# Patient Record
Sex: Male | Born: 1965 | Race: White | Hispanic: No | Marital: Single | Smoking: Never smoker
Health system: Southern US, Community
[De-identification: ages and names within clinical notes are randomized; demographics above are authoritative.]

## PROBLEM LIST (undated history)

## (undated) DIAGNOSIS — F419 Anxiety disorder, unspecified: Secondary | ICD-10-CM

## (undated) DIAGNOSIS — F1010 Alcohol abuse, uncomplicated: Secondary | ICD-10-CM

## (undated) DIAGNOSIS — F32A Depression, unspecified: Secondary | ICD-10-CM

## (undated) DIAGNOSIS — F329 Major depressive disorder, single episode, unspecified: Secondary | ICD-10-CM

## (undated) HISTORY — PX: HERNIA REPAIR: SHX51

---
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
## 2017-11-15 DIAGNOSIS — H53459 Other localized visual field defect, unspecified eye: Secondary | ICD-10-CM | POA: Diagnosis not present

## 2017-11-15 DIAGNOSIS — H538 Other visual disturbances: Secondary | ICD-10-CM | POA: Diagnosis not present

## 2017-11-15 DIAGNOSIS — G43B0 Ophthalmoplegic migraine, not intractable: Secondary | ICD-10-CM | POA: Diagnosis not present

## 2018-04-04 DIAGNOSIS — Z1211 Encounter for screening for malignant neoplasm of colon: Secondary | ICD-10-CM | POA: Diagnosis not present

## 2018-04-04 DIAGNOSIS — Z23 Encounter for immunization: Secondary | ICD-10-CM | POA: Diagnosis not present

## 2018-05-26 DIAGNOSIS — M9904 Segmental and somatic dysfunction of sacral region: Secondary | ICD-10-CM | POA: Diagnosis not present

## 2018-05-26 DIAGNOSIS — M9905 Segmental and somatic dysfunction of pelvic region: Secondary | ICD-10-CM | POA: Diagnosis not present

## 2018-05-26 DIAGNOSIS — M62830 Muscle spasm of back: Secondary | ICD-10-CM | POA: Diagnosis not present

## 2018-05-29 DIAGNOSIS — M9905 Segmental and somatic dysfunction of pelvic region: Secondary | ICD-10-CM | POA: Diagnosis not present

## 2018-05-29 DIAGNOSIS — M9904 Segmental and somatic dysfunction of sacral region: Secondary | ICD-10-CM | POA: Diagnosis not present

## 2018-05-29 DIAGNOSIS — M62830 Muscle spasm of back: Secondary | ICD-10-CM | POA: Diagnosis not present

## 2018-05-30 DIAGNOSIS — M62830 Muscle spasm of back: Secondary | ICD-10-CM | POA: Diagnosis not present

## 2018-05-30 DIAGNOSIS — M9905 Segmental and somatic dysfunction of pelvic region: Secondary | ICD-10-CM | POA: Diagnosis not present

## 2018-05-30 DIAGNOSIS — M9904 Segmental and somatic dysfunction of sacral region: Secondary | ICD-10-CM | POA: Diagnosis not present

## 2018-06-02 DIAGNOSIS — M9905 Segmental and somatic dysfunction of pelvic region: Secondary | ICD-10-CM | POA: Diagnosis not present

## 2018-06-02 DIAGNOSIS — M9904 Segmental and somatic dysfunction of sacral region: Secondary | ICD-10-CM | POA: Diagnosis not present

## 2018-06-02 DIAGNOSIS — M62830 Muscle spasm of back: Secondary | ICD-10-CM | POA: Diagnosis not present

## 2018-06-06 DIAGNOSIS — M9904 Segmental and somatic dysfunction of sacral region: Secondary | ICD-10-CM | POA: Diagnosis not present

## 2018-06-06 DIAGNOSIS — M62830 Muscle spasm of back: Secondary | ICD-10-CM | POA: Diagnosis not present

## 2018-06-06 DIAGNOSIS — M9905 Segmental and somatic dysfunction of pelvic region: Secondary | ICD-10-CM | POA: Diagnosis not present

## 2018-06-09 DIAGNOSIS — M9905 Segmental and somatic dysfunction of pelvic region: Secondary | ICD-10-CM | POA: Diagnosis not present

## 2018-06-09 DIAGNOSIS — M62830 Muscle spasm of back: Secondary | ICD-10-CM | POA: Diagnosis not present

## 2018-06-09 DIAGNOSIS — M9904 Segmental and somatic dysfunction of sacral region: Secondary | ICD-10-CM | POA: Diagnosis not present

## 2018-06-13 DIAGNOSIS — M62830 Muscle spasm of back: Secondary | ICD-10-CM | POA: Diagnosis not present

## 2018-06-13 DIAGNOSIS — M9905 Segmental and somatic dysfunction of pelvic region: Secondary | ICD-10-CM | POA: Diagnosis not present

## 2018-06-13 DIAGNOSIS — M9904 Segmental and somatic dysfunction of sacral region: Secondary | ICD-10-CM | POA: Diagnosis not present

## 2018-06-27 DIAGNOSIS — Z0000 Encounter for general adult medical examination without abnormal findings: Secondary | ICD-10-CM | POA: Diagnosis not present

## 2018-06-27 DIAGNOSIS — Z13220 Encounter for screening for lipoid disorders: Secondary | ICD-10-CM | POA: Diagnosis not present

## 2018-08-07 ENCOUNTER — Other Ambulatory Visit: Payer: Self-pay | Admitting: Family Medicine

## 2018-08-07 ENCOUNTER — Other Ambulatory Visit: Payer: Self-pay

## 2018-08-07 ENCOUNTER — Ambulatory Visit: Payer: Self-pay

## 2018-08-07 DIAGNOSIS — M79645 Pain in left finger(s): Secondary | ICD-10-CM

## 2018-11-16 ENCOUNTER — Encounter (HOSPITAL_COMMUNITY): Payer: Self-pay | Admitting: Emergency Medicine

## 2018-11-16 ENCOUNTER — Inpatient Hospital Stay (HOSPITAL_COMMUNITY)
Admission: EM | Admit: 2018-11-16 | Discharge: 2018-11-18 | DRG: 894 | Disposition: A | Discharge status: Home / Self Care | Payer: BC Managed Care – PPO | Attending: Family Medicine | Admitting: Family Medicine

## 2018-11-16 ENCOUNTER — Other Ambulatory Visit: Payer: Self-pay

## 2018-11-16 DIAGNOSIS — R51 Headache: Secondary | ICD-10-CM | POA: Diagnosis present

## 2018-11-16 DIAGNOSIS — F10231 Alcohol dependence with withdrawal delirium: Secondary | ICD-10-CM

## 2018-11-16 DIAGNOSIS — F10229 Alcohol dependence with intoxication, unspecified: Secondary | ICD-10-CM | POA: Diagnosis present

## 2018-11-16 DIAGNOSIS — Z79899 Other long term (current) drug therapy: Secondary | ICD-10-CM

## 2018-11-16 DIAGNOSIS — F10129 Alcohol abuse with intoxication, unspecified: Secondary | ICD-10-CM

## 2018-11-16 DIAGNOSIS — F10931 Alcohol use, unspecified with withdrawal delirium: Secondary | ICD-10-CM

## 2018-11-16 DIAGNOSIS — F329 Major depressive disorder, single episode, unspecified: Secondary | ICD-10-CM | POA: Diagnosis present

## 2018-11-16 DIAGNOSIS — F1010 Alcohol abuse, uncomplicated: Secondary | ICD-10-CM

## 2018-11-16 DIAGNOSIS — F40240 Claustrophobia: Secondary | ICD-10-CM | POA: Diagnosis present

## 2018-11-16 DIAGNOSIS — R740 Nonspecific elevation of levels of transaminase and lactic acid dehydrogenase [LDH]: Secondary | ICD-10-CM | POA: Diagnosis present

## 2018-11-16 DIAGNOSIS — F419 Anxiety disorder, unspecified: Secondary | ICD-10-CM | POA: Diagnosis present

## 2018-11-16 DIAGNOSIS — R748 Abnormal levels of other serum enzymes: Secondary | ICD-10-CM | POA: Diagnosis present

## 2018-11-16 DIAGNOSIS — D696 Thrombocytopenia, unspecified: Secondary | ICD-10-CM

## 2018-11-16 DIAGNOSIS — F10239 Alcohol dependence with withdrawal, unspecified: Principal | ICD-10-CM | POA: Diagnosis present

## 2018-11-16 DIAGNOSIS — K7030 Alcoholic cirrhosis of liver without ascites: Secondary | ICD-10-CM | POA: Diagnosis present

## 2018-11-16 DIAGNOSIS — Z1159 Encounter for screening for other viral diseases: Secondary | ICD-10-CM

## 2018-11-16 DIAGNOSIS — K709 Alcoholic liver disease, unspecified: Secondary | ICD-10-CM | POA: Diagnosis present

## 2018-11-16 DIAGNOSIS — R251 Tremor, unspecified: Secondary | ICD-10-CM | POA: Diagnosis present

## 2018-11-16 HISTORY — DX: Alcohol abuse, uncomplicated: F10.10

## 2018-11-16 LAB — COMPREHENSIVE METABOLIC PANEL
ALT: 65 U/L — ABNORMAL HIGH (ref 0–44)
AST: 79 U/L — ABNORMAL HIGH (ref 15–41)
Albumin: 4.7 g/dL (ref 3.5–5.0)
Alkaline Phosphatase: 63 U/L (ref 38–126)
Anion gap: 13 (ref 5–15)
BUN: 19 mg/dL (ref 6–20)
CO2: 29 mmol/L (ref 22–32)
Calcium: 9 mg/dL (ref 8.9–10.3)
Chloride: 98 mmol/L (ref 98–111)
Creatinine, Ser: 0.79 mg/dL (ref 0.61–1.24)
GFR calc Af Amer: >60 mL/min (ref >60)
GFR calc non Af Amer: >60 mL/min (ref >60)
Glucose, Bld: 105 mg/dL — ABNORMAL HIGH (ref 70–99)
Potassium: 3.9 mmol/L (ref 3.5–5.1)
Sodium: 140 mmol/L (ref 135–145)
Total Bilirubin: 0.3 mg/dL (ref 0.3–1.2)
Total Protein: 7.3 g/dL (ref 6.5–8.1)

## 2018-11-16 LAB — RAPID URINE DRUG SCREEN, HOSP PERFORMED
Amphetamines: NOT DETECTED
Barbiturates: NOT DETECTED
Benzodiazepines: NOT DETECTED
Cocaine: NOT DETECTED
Opiates: NOT DETECTED
Tetrahydrocannabinol: NOT DETECTED

## 2018-11-16 LAB — CBC
HCT: 43.3 % (ref 39.0–52.0)
Hemoglobin: 14.8 g/dL (ref 13.0–17.0)
MCH: 32.7 pg (ref 26.0–34.0)
MCHC: 34.2 g/dL (ref 30.0–36.0)
MCV: 95.6 fL (ref 80.0–100.0)
Platelets: 129 10*3/uL — ABNORMAL LOW (ref 150–400)
RBC: 4.53 MIL/uL (ref 4.22–5.81)
RDW: 14.3 % (ref 11.5–15.5)
WBC: 4.9 10*3/uL (ref 4.0–10.5)
nRBC: 0 % (ref 0.0–0.2)

## 2018-11-16 LAB — CBG MONITORING, ED: Glucose-Capillary: 80 mg/dL (ref 70–99)

## 2018-11-16 LAB — SARS CORONAVIRUS 2 BY RT PCR (HOSPITAL ORDER, PERFORMED IN ~~LOC~~ HOSPITAL LAB): SARS Coronavirus 2: NEGATIVE

## 2018-11-16 LAB — ETHANOL: Alcohol, Ethyl (B): 225 mg/dL — ABNORMAL HIGH (ref <10)

## 2018-11-16 MED ORDER — LORAZEPAM 1 MG PO TABS: 0.0000 mg | ORAL_TABLET | Freq: Four times a day (QID) | ORAL | Status: DC

## 2018-11-16 MED ORDER — IBUPROFEN 200 MG PO TABS: 600.0000 mg | ORAL_TABLET | Freq: Three times a day (TID) | ORAL | Status: DC | PRN

## 2018-11-16 MED ORDER — VITAMIN B-1 100 MG PO TABS
100.0000 mg | ORAL_TABLET | Freq: Every day | ORAL | Status: DC
  Administered 2018-11-16 – 2018-11-17 (×2): 100 mg via ORAL
  Filled 2018-11-16 (×2): qty 1

## 2018-11-16 MED ORDER — THIAMINE HCL 100 MG/ML IJ SOLN
Freq: Once | INTRAVENOUS | Status: AC
  Administered 2018-11-16: 15:00:00 via INTRAVENOUS
  Filled 2018-11-16: qty 1000

## 2018-11-16 MED ORDER — ZOLPIDEM TARTRATE 5 MG PO TABS: 5.0000 mg | ORAL_TABLET | Freq: Every evening | ORAL | Status: DC | PRN

## 2018-11-16 MED ORDER — LORAZEPAM 2 MG/ML IJ SOLN
0.0000 mg | Freq: Four times a day (QID) | INTRAMUSCULAR | Status: DC
  Administered 2018-11-16 (×2): 2 mg via INTRAVENOUS
  Administered 2018-11-16 (×2): 4 mg via INTRAVENOUS
  Administered 2018-11-17 (×2): 2 mg via INTRAVENOUS
  Filled 2018-11-16 (×2): qty 1
  Filled 2018-11-16: qty 2
  Filled 2018-11-16 (×2): qty 1
  Filled 2018-11-16: qty 2

## 2018-11-16 MED ORDER — FOLIC ACID 1 MG PO TABS
1.0000 mg | ORAL_TABLET | Freq: Every day | ORAL | Status: DC
  Administered 2018-11-17: 09:00:00 1 mg via ORAL
  Filled 2018-11-16: qty 1

## 2018-11-16 MED ORDER — GABAPENTIN 300 MG PO CAPS
300.0000 mg | ORAL_CAPSULE | Freq: Two times a day (BID) | ORAL | Status: DC
  Administered 2018-11-16: 300 mg via ORAL
  Filled 2018-11-16: qty 1

## 2018-11-16 MED ORDER — LORAZEPAM 1 MG PO TABS: 0.0000 mg | ORAL_TABLET | Freq: Two times a day (BID) | ORAL | Status: DC

## 2018-11-16 MED ORDER — ALUM & MAG HYDROXIDE-SIMETH 200-200-20 MG/5ML PO SUSP: 30.0000 mL | Freq: Four times a day (QID) | ORAL | Status: DC | PRN

## 2018-11-16 MED ORDER — LORAZEPAM 2 MG/ML IJ SOLN
2.0000 mg | Freq: Four times a day (QID) | INTRAMUSCULAR | Status: DC | PRN
  Administered 2018-11-16 – 2018-11-17 (×4): 2 mg via INTRAVENOUS
  Filled 2018-11-16 (×4): qty 1

## 2018-11-16 MED ORDER — ADULT MULTIVITAMIN W/MINERALS CH
1.0000 | ORAL_TABLET | Freq: Once | ORAL | Status: AC
  Administered 2018-11-16: 09:00:00 1 via ORAL
  Filled 2018-11-16: qty 1

## 2018-11-16 MED ORDER — THIAMINE HCL 100 MG/ML IJ SOLN: 100.0000 mg | Freq: Every day | INTRAMUSCULAR | Status: DC

## 2018-11-16 MED ORDER — LORAZEPAM 2 MG/ML IJ SOLN: 0.0000 mg | Freq: Two times a day (BID) | INTRAMUSCULAR | Status: DC

## 2018-11-16 MED ORDER — ONDANSETRON HCL 4 MG PO TABS: 4.0000 mg | ORAL_TABLET | Freq: Three times a day (TID) | ORAL | Status: DC | PRN

## 2018-11-16 MED ORDER — ESCITALOPRAM OXALATE 20 MG PO TABS
20.0000 mg | ORAL_TABLET | Freq: Every day | ORAL | Status: DC
  Administered 2018-11-16 – 2018-11-18 (×3): 20 mg via ORAL
  Filled 2018-11-16: qty 2
  Filled 2018-11-16: qty 1
  Filled 2018-11-16: qty 2

## 2018-11-16 MED ORDER — ENOXAPARIN SODIUM 40 MG/0.4ML ~~LOC~~ SOLN
40.0000 mg | SUBCUTANEOUS | Status: DC
  Administered 2018-11-16: 40 mg via SUBCUTANEOUS
  Filled 2018-11-16: qty 0.4

## 2018-11-16 NOTE — ED Notes (Signed)
Patient stated he has relapsed after being clean for almost 3 years. Patient stated he needs help and that he feels embarrassed about this entire situation but knows that it will get bad having gone through ETOH detox in past. Patient denies SI / HI, Voluntary, and showing no signs of aggression or agitation at this time with staff.

## 2018-11-16 NOTE — H&P (Addendum)
History and Physical    Jermaine [name] Finger ZOX:[id number] DOB: 11/25/1965 DOA: 11/16/2018  PCP: Patient, No Pcp Per   Patient coming from: Home    Chief Complaint: Alcohol intoxication, fear of DTs  HPI: Jermaine [name] Prom is a 53 y.o. male with medical history significant of chronic alcohol abuse who presents the emergency department after he drank a lot of alcohol and was a scared for getting DTs. Patient has history of chronic alcohol abuse.  He remains sober for 2 years, went to alcohol rehabilitation but relapsed few weeks ago. He has anxiety and takes Lexapro at home.  He did not specify any clear reason why did he relapse.  He was also pretty sedated during my evaluation because he was given Ativan.Ethyl alcohol level was also significantly elevated on presentation.Patient denies any other medical problems. Patient seen and examined at the bedside in the emergency department.  Currently hemodynamically stable.  Drowsy/sleepy.  He says he drinks a large bottle of vodka every day. Patient denies any fever, chills, chest pain or shortness of breath, cough, abdominal pain, nausea, vomiting or diarrhea.  ED Course: CIWA was 20.  Started on CIWA protocol.  Started on banana bag, thiamine, folic acid.UDS negative. COVID-19 screening negative.  Review of Systems: As per HPI otherwise 10 point review of systems negative.    Past Medical History:  Diagnosis Date  . Alcohol abuse     Past Surgical History:  Procedure Laterality Date  . HERNIA REPAIR       reports that he has never smoked. He has never used smokeless tobacco. He reports current alcohol use. He reports previous drug use.  No Known Allergies  No family history on file.   Prior to Admission medications   Medication Sig Start Date End Date Taking? Authorizing Provider  escitalopram (LEXAPRO) 20 MG tablet Take 20 mg by mouth daily. 08/13/18  Yes [provider]    Physical Exam: Vitals:   11/16/18 1240 11/16/18  1255 11/16/18 1300 11/16/18 1320  BP: (!) 138/96 (!) 138/96 125/89 117/85  Pulse: 98 91 90 82  Resp: 20  14 20   Temp:      TempSrc:      SpO2: 93%  97% 92%    Constitutional: Sleepy/drowsy/intoxicated with alcohol,anxious Vitals:   11/16/18 1240 11/16/18 1255 11/16/18 1300 11/16/18 1320  BP: (!) 138/96 (!) 138/96 125/89 117/85  Pulse: 98 91 90 82  Resp: 20  14 20   Temp:      TempSrc:      SpO2: 93%  97% 92%   Eyes: PERRL, lids and conjunctivae normal ENMT: Mucous membranes are moist. Neck: normal, supple, no masses, no thyromegaly Respiratory: clear to auscultation bilaterally, no wheezing, no crackles. Normal respiratory effort. No accessory muscle use.  Cardiovascular: Regular rate and rhythm, no murmurs / rubs / gallops. No extremity edema. 2+ pedal pulses. No carotid bruits.  Abdomen: no tenderness, no masses palpated. No hepatosplenomegaly. Bowel sounds positive.  Musculoskeletal: no clubbing / cyanosis. No joint deformity upper and lower extremities. Good ROM, no contractures. Normal muscle tone.  Skin: no rashes, lesions, ulcers. No induration Neurologic: CN 2-12 grossly intact. Sensation intact,Strength 5/5 in all 4.  Foley Catheter:None   Labs on Admission: I have personally reviewed following labs and imaging studies  CBC: Recent Labs  Lab 11/16/18 0839  WBC 4.9  HGB 14.8  HCT 43.3  MCV 95.6  PLT 129*   Basic Metabolic Panel: Recent Labs  Lab 11/16/18 0839  NA 140  K 3.9  CL 98  CO2 29  GLUCOSE 105*  BUN 19  CREATININE 0.79  CALCIUM 9.0   GFR: CrCl cannot be calculated (Unknown ideal weight.). Liver Function Tests: Recent Labs  Lab 11/16/18 0839  AST 79*  ALT 65*  ALKPHOS 63  BILITOT 0.3  PROT 7.3  ALBUMIN 4.7   No results for input(s): LIPASE, AMYLASE in the last 168 hours. No results for input(s): AMMONIA in the last 168 hours. Coagulation Profile: No results for input(s): INR, PROTIME in the last 168 hours. Cardiac Enzymes: No  results for input(s): CKTOTAL, CKMB, CKMBINDEX, TROPONINI in the last 168 hours. BNP (last 3 results) No results for input(s): PROBNP in the last 8760 hours. HbA1C: No results for input(s): HGBA1C in the last 72 hours. CBG: No results for input(s): GLUCAP in the last 168 hours. Lipid Profile: No results for input(s): CHOL, HDL, LDLCALC, TRIG, CHOLHDL, LDLDIRECT in the last 72 hours. Thyroid Function Tests: No results for input(s): TSH, T4TOTAL, FREET4, T3FREE, THYROIDAB in the last 72 hours. Anemia Panel: No results for input(s): VITAMINB12, FOLATE, FERRITIN, TIBC, IRON, RETICCTPCT in the last 72 hours. Urine analysis: No results found for: COLORURINE, APPEARANCEUR, LABSPEC, PHURINE, GLUCOSEU, HGBUR, BILIRUBINUR, KETONESUR, PROTEINUR, UROBILINOGEN, NITRITE, LEUKOCYTESUR  Radiological Exams on Admission: No results found.   Assessment/Plan Principal Problem:   Alcohol abuse with intoxication ([place]) Active Problems:   Chronic alcohol abuse   Elevated liver enzymes   Thrombocytopenia ([facility])   Acute alcohol intoxication with alcoholism ([place])    Alcohol abuse with intoxication: Drinks 1 large bottle of vodka every day.  Presented to the emergency department because he became scared of getting DTs. Started on CIWA protocol.  Close monitoring in telemetry.  If needs too much Ativan or has persistent DTs ,then will consider Precedex drip. Counseled for cessation of alcohol.  Start on thiamine folic acid We will request for social worker evaluation  for possible alcohol rehabilitation.  Elevated liver enzymes: Secondary to chronic alcohol abuse.  Mild  Thrombocytopenia: Likely Chronic .  Secondary to chronic alcohol abuse.  Continue to monitor.  History of anxiety: Continue Lexapro.    Severity of Illness: The appropriate patient status for this patient is OBSERVATION.    DVT prophylaxis: Lovenox Code Status: Full Family Communication: None present at the bedside Consults  called: None     Shelly Coss MD [facility] Pager [phone]  If 7PM-7AM, please contact night-coverage [email or web address] Password [email or web address]  11/16/2018, 1:30 PM

## 2018-11-16 NOTE — ED Provider Notes (Addendum)
[facility]-EMERGENCY DEPT Provider Note   CSN: [id number] Arrival date & time: 11/16/18  0754    History   Chief Complaint Chief Complaint  Patient presents with  . Delirium Tremens (DTS)    HPI Jermaine [name] is a 53 y.o. male.     53 year old male with history of alcohol abuse presents feeling anxious, requesting detox.  Patient states that he went through a formal detox program 3 years ago, relapsed about a month ago and has been drinking heavily for the past 3 to 4 days.  Patient reports drinking half a gallon of vodka daily, last drink 6 to 8 hours ago.  Denies other alcohol or drug use.  Patient states he is feeling very anxious, afraid he will have a panic attack or a seizure.  No history of prior seizures.  Patient denies suicidal or homicidal ideation, no other complaints or concerns.     Past Medical History:  Diagnosis Date  . Alcohol abuse     Patient Active Problem List   Diagnosis Date Noted  . Alcohol abuse with intoxication (HCC) 11/16/2018  . Chronic alcohol abuse 11/16/2018  . Elevated liver enzymes 11/16/2018  . Thrombocytopenia (HCC) 11/16/2018  . Acute alcohol intoxication with alcoholism ([facility]) 11/16/2018    Past Surgical History:  Procedure Laterality Date  . HERNIA REPAIR          Home Medications    Prior to Admission medications   Medication Sig Start Date End Date Taking? Authorizing Provider  escitalopram (LEXAPRO) 20 MG tablet Take 20 mg by mouth daily. 08/13/18  Yes [provider]    Family History No family history on file.  Social History Social History   Tobacco Use  . Smoking status: Never Smoker  . Smokeless tobacco: Never Used  Substance Use Topics  . Alcohol use: Yes  . Drug use: Not Currently     Allergies   Patient has no known allergies.   Review of Systems Review of Systems  Constitutional: Positive for diaphoresis. Negative for chills and fever.  Respiratory: Negative for  shortness of breath.   Cardiovascular: Negative for chest pain.  Gastrointestinal: Positive for nausea. Negative for abdominal pain and vomiting.  Genitourinary: Negative for difficulty urinating.  Musculoskeletal: Negative for arthralgias and myalgias.  Skin: Negative for rash and wound.  Allergic/Immunologic: Negative for immunocompromised state.  Neurological: Positive for tremors and headaches. Negative for weakness.  Psychiatric/Behavioral: Negative for hallucinations, self-injury and suicidal ideas. The patient is nervous/anxious.   All other systems reviewed and are negative.    Physical Exam Updated Vital Signs BP 117/85   Pulse 82   Temp 98.6 F (37 C) (Oral)   Resp 20   SpO2 92%   Physical Exam Vitals signs and nursing note reviewed.  Constitutional:      General: He is not in acute distress.    Appearance: He is well-developed. He is not diaphoretic.  HENT:     Head: Normocephalic and atraumatic.     Mouth/Throat:     Mouth: Mucous membranes are moist.  Neck:     Musculoskeletal: Neck supple.  Cardiovascular:     Rate and Rhythm: Normal rate and regular rhythm.     Pulses: Normal pulses.     Heart sounds: Normal heart sounds.  Pulmonary:     Effort: Pulmonary effort is normal.     Breath sounds: Normal breath sounds.  Abdominal:     Tenderness: There is no abdominal tenderness.  Musculoskeletal:  General: No tenderness.     Right lower leg: No edema.     Left lower leg: No edema.  Skin:    General: Skin is warm.     Comments: Slightly diaphoretic  Neurological:     Mental Status: He is alert and oriented to person, place, and time.  Psychiatric:        Attention and Perception: Attention normal.        Mood and Affect: Mood is anxious.        Speech: Speech is rapid and pressured.        Behavior: Behavior is agitated.        Thought Content: Thought content is not paranoid. Thought content does not include homicidal or suicidal ideation.       ED Treatments / Results  Labs (all labs ordered are listed, but only abnormal results are displayed) Labs Reviewed  COMPREHENSIVE METABOLIC PANEL - Abnormal; Notable for the following components:      Result Value   Glucose, Bld 105 (*)    AST 79 (*)    ALT 65 (*)    All other components within normal limits  ETHANOL - Abnormal; Notable for the following components:   Alcohol, Ethyl (B) 225 (*)    All other components within normal limits  CBC - Abnormal; Notable for the following components:   Platelets 129 (*)    All other components within normal limits  SARS CORONAVIRUS 2 (HOSPITAL ORDER, PERFORMED IN [facility])  RAPID URINE DRUG SCREEN, HOSP PERFORMED  VITAMIN B1  HIV ANTIBODY (ROUTINE TESTING W REFLEX)    EKG EKG Interpretation  Date/Time:  Sunday November 16 2018 08:22:08 EDT Ventricular Rate:  96 PR Interval:    QRS Duration: 115 QT Interval:  393 QTC Calculation: 497 R Axis:   96 Text Interpretation:  Sinus rhythm Nonspecific intraventricular conduction delay Baseline wander in lead(s) II III Abnormal ECG Confirmed by Gerhard MunchLockwood, Robert [phone]([id number]) on 11/16/2018 8:27:34 AM   Radiology No results found.  Procedures .Critical Care Performed by: Jeannie FendMurphy,  A, PA-C Authorized by: Jeannie FendMurphy,  A, PA-C   Critical care provider statement:    Critical care time (minutes):  45   Critical care was time spent personally by me on the following activities:  Discussions with consultants, evaluation of patient's response to treatment, examination of patient, ordering and performing treatments and interventions, ordering and review of laboratory studies, ordering and review of radiographic studies, pulse oximetry, re-evaluation of patient's condition, obtaining history from patient or surrogate and review of old charts   (including critical care time)  Medications Ordered in ED Medications  LORazepam (ATIVAN) injection 0-4 mg (4 mg Intravenous Given 11/16/18  1308)    Or  LORazepam (ATIVAN) tablet 0-4 mg ( Oral See Alternative 11/16/18 1308)  LORazepam (ATIVAN) injection 0-4 mg (has no administration in time range)    Or  LORazepam (ATIVAN) tablet 0-4 mg (has no administration in time range)  thiamine (VITAMIN B-1) tablet 100 mg (100 mg Oral Given 11/16/18 0902)    Or  thiamine (B-1) injection 100 mg ( Intravenous See Alternative 11/16/18 0902)  ibuprofen (ADVIL) tablet 600 mg (has no administration in time range)  ondansetron (ZOFRAN) tablet 4 mg (has no administration in time range)  alum & mag hydroxide-simeth (MAALOX/MYLANTA) 200-200-20 MG/5ML suspension 30 mL (has no administration in time range)  folic acid (FOLVITE) tablet 1 mg (has no administration in time range)  sodium chloride 0.9 % 1,000 mL with  thiamine 161 mg, folic acid 1 mg, multivitamins adult 10 mL infusion (has no administration in time range)  escitalopram (LEXAPRO) tablet 20 mg (has no administration in time range)  enoxaparin (LOVENOX) injection 40 mg (has no administration in time range)  multivitamin with minerals tablet 1 tablet (1 tablet Oral Given 11/16/18 0902)     Initial Impression / Assessment and Plan / ED Course  I have reviewed the triage vital signs and the nursing notes.  Pertinent labs & imaging results that were available during my care of the patient were reviewed by me and considered in my medical decision making (see chart for details).  Clinical Course as of Nov 15 1329  Sun Nov 15, 6125  6162 53 year old male presents to the ER with request for detox.  Patient states that he is an alcoholic, last went through detox 3 years ago, relapsed 1 month ago with report of heavy drinking for the past 3 to 4 days.  Patient reports drinking half a gallon of vodka daily, last had alcohol 6 to 8 hours prior to arrival.  Patient reports feeling like he may have a seizure or a panic attack.  No history of prior seizures.  Patient does not have any other complaints.  On  exam patient appears very uncomfortable, tremulous, clammy.  Initial CIWA of 21, given 4 of Ativan.  After Ativan patient was asleep and resting comfortably, O2 sats dropped to 88% on room air and patient was started on 2 L nasal cannula with improvement.  Patient is now awake, requesting something for tremors and began feeling unwell, he is visibly tremulous.  Repeat CIWA score is 20, patient given additional Ativan.  Case discussed with Dr. Vanita Panda, ER attending, agrees with plan for admission to hospitalist service for further monitoring.  Review of lab work, patient is a negative COVID test, CBC with platelets of 129, otherwise normal.  Alcohol 225, CMP with mildly elevated liver enzymes.  Urine drug screen negative.   [LM]  1326 Case discussed with hospitalist who will consult for admission.    [LM]    Clinical Course User Index [LM] Tacy Learn, PA-C      Final Clinical Impressions(s) / ED Diagnoses   Final diagnoses:  Delirium tremens [facility])    ED Discharge Orders    None       Tacy Learn, PA-C 11/16/18 1327    Tacy Learn, PA-C 11/16/18 1331    Carmin Muskrat, MD 11/22/18 2221

## 2018-11-16 NOTE — ED Notes (Signed)
Bed: WA16 Expected date:  Expected time:  Means of arrival:  Comments: 

## 2018-11-16 NOTE — ED Notes (Signed)
Report given to [facility] RN for 4E, Room 1416.

## 2018-11-16 NOTE — ED Triage Notes (Signed)
Pt reports he is an alcoholic and relapsed drinking about 3 gallons of ETOH. Reports last drink was about 4-5 hours ago. Pt is shaky, has headache, loose stools and nausea.

## 2018-11-16 NOTE — Patient Outreach (Signed)
ED Peer Support Specialist Patient Intake (Complete at intake & 30-60 Day Follow-up)  Name: Jermaine [name]  MRN: [id number]  Age: 53 y.o.   Date of Admission: 11/16/2018  Intake: Initial Comments:      Primary Reason Admitted: Alcohol Abuse   Lab values: Alcohol/ETOH: Positive Positive UDS? No Amphetamines: No Barbiturates: No Benzodiazepines: No Cocaine: No Opiates: No Cannabinoids: No  Demographic information: Gender: Male Ethnicity: White Marital Status: Single Insurance Status: Diplomatic Services operational officer (Work Neurosurgeon, Physicist, medical, etc.: No Lives with: Alone Living situation: Homeless  Reported Patient History: Patient reported health conditions: Anxiety disorders, Depression Patient aware of HIV and hepatitis status: No  In past year, has patient visited ED for any reason? No  Number of ED visits:    Reason(s) for visit:    In past year, has patient been hospitalized for any reason? No  Number of hospitalizations:    Reason(s) for hospitalization:    In past year, has patient been arrested? No  Number of arrests:    Reason(s) for arrest:    In past year, has patient been incarcerated? No  Number of incarcerations:    Reason(s) for incarceration:    In past year, has patient received medication-assisted treatment? No  In past year, patient received the following treatments:    In past year, has patient received any harm reduction services? No  Did this include any of the following?    In past year, has patient received care from a mental health provider for diagnosis other than SUD? No  In past year, is this first time patient has overdosed? No  Number of past overdoses:    In past year, is this first time patient has been hospitalized for an overdose? No  Number of hospitalizations for overdose(s):    Is patient currently receiving treatment for a mental health diagnosis? No  Patient reports experiencing  difficulty participating in SUD treatment: No    Most important reason(s) for this difficulty?    Has patient received prior services for treatment? No  In past, patient has received services from following agencies:    Plan of Care:  Suggested follow up at these agencies/treatment centers: (Pt stated that he does not need any assistance at the time)  Other information: [facility] spoke with Pt via Phone and was able to gain information and complete series of questions. [facility] was made aware that Pt had relapse and he does not want to attend a outpatient or inpatient facility because of the fact that he does not want to loss his job. [facility]PSS praised Pt for wanting to gain help as well as wanting to speak with [facility] about seeking help. [facility]PSS informed Pt that CPSS will follow up with Pt in the community to monitor services and to possibly link an coordinate services as needed.      [name] Edelman , [place]  11/16/2018 11:42 AM

## 2018-11-16 NOTE — ED Notes (Signed)
ED TO INPATIENT HANDOFF REPORT  Name/Age/Gender Jermaine [name] 53 y.o. male  Code Status    Code Status Orders  (From admission, onward)         Start     Ordered   11/16/18 1330  Full code  Continuous     11/16/18 1329        Code Status History    Date Active Date Inactive Code Status Order ID Comments User Context   11/16/2018 0803 11/16/2018 1329 Full Code [id number]  [facility]   Advance Care Planning Activity      Home/SNF/Other Home  Chief Complaint detox  Level of Care/Admitting Diagnosis ED Disposition    ED Disposition Condition [place]: [facility] [100102]  Level of Care: Telemetry [5]  Admit to tele based on following criteria: Complex arrhythmia (Bradycardia/Tachycardia)  Covid Evaluation: Confirmed COVID Negative  Diagnosis: Acute alcohol intoxication with alcoholism [name]) [737106]  Admitting Physician: Shelly Coss [[id number]]  Attending Physician: Shelly Coss [[id number]]  PT Class (Do Not Modify): Observation [104]  PT Acc Code (Do Not Modify): Observation [10022]       Medical History Past Medical History:  Diagnosis Date  . Alcohol abuse     Allergies No Known Allergies  IV Location/Drains/Wounds Patient Lines/Drains/Airways Status   Active Line/Drains/Airways    Name:   Placement date:   Placement time:   Site:   Days:   Peripheral IV 11/16/18 Right Forearm   11/16/18    0839    Forearm   less than 1          Labs/Imaging Results for orders placed or performed during the hospital encounter of 11/16/18 (from the past 48 hour(s))  Rapid urine drug screen (hospital performed)     Status: None   Collection Time: 11/16/18  8:02 AM  Result Value Ref Range   Opiates NONE DETECTED NONE DETECTED   Cocaine NONE DETECTED NONE DETECTED   Benzodiazepines NONE DETECTED NONE DETECTED   Amphetamines NONE DETECTED NONE DETECTED   Tetrahydrocannabinol NONE DETECTED NONE DETECTED    Barbiturates NONE DETECTED NONE DETECTED    Comment: (NOTE) DRUG SCREEN FOR MEDICAL PURPOSES ONLY.  IF CONFIRMATION IS NEEDED FOR ANY PURPOSE, NOTIFY LAB WITHIN 5 DAYS. LOWEST DETECTABLE LIMITS FOR URINE DRUG SCREEN Drug Class                     Cutoff (ng/mL) Amphetamine and metabolites    1000 Barbiturate and metabolites    200 Benzodiazepine                 093 Tricyclics and metabolites     300 Opiates and metabolites        300 Cocaine and metabolites        300 THC                            50 Performed at [facility], [place] [address] [place], [place] [address]   Comprehensive metabolic panel     Status: Abnormal   Collection Time: 11/16/18  8:39 AM  Result Value Ref Range   Sodium 140 135 - 145 mmol/L   Potassium 3.9 3.5 - 5.1 mmol/L   Chloride 98 98 - 111 mmol/L   CO2 29 22 - 32 mmol/L   Glucose, Bld 105 (H) 70 - 99 mg/dL   BUN 19 6 -  20 mg/dL   Creatinine, Ser 1.610.79 0.61 - 1.24 mg/dL   Calcium 9.0 8.9 - 09.610.3 mg/dL   Total Protein 7.3 6.5 - 8.1 g/dL   Albumin 4.7 3.5 - 5.0 g/dL   AST 79 (H) 15 - 41 U/L   ALT 65 (H) 0 - 44 U/L   Alkaline Phosphatase 63 38 - 126 U/L   Total Bilirubin 0.3 0.3 - 1.2 mg/dL   GFR calc non Af Amer >60 >60 mL/min   GFR calc Af Amer >60 >60 mL/min   Anion gap 13 5 - 15    Comment: Performed at [facility], [address] [place], [place] [address]  Ethanol     Status: Abnormal   Collection Time: 11/16/18  8:39 AM  Result Value Ref Range   Alcohol, Ethyl (B) 225 (H) <10 mg/dL    Comment: (NOTE) Lowest detectable limit for serum alcohol is 10 mg/dL. For medical purposes only. Performed at [facility], [address] [place], [place] [address]   cbc     Status: Abnormal   Collection Time: 11/16/18  8:39 AM  Result Value Ref Range   WBC 4.9 4.0 - 10.5 K/uL   RBC 4.53 4.22 - 5.81 MIL/uL   Hemoglobin 14.8 13.0 - 17.0 g/dL   HCT 14.743.3 82.939.0 - 56.252.0 %   MCV 95.6 80.0 - 100.0  fL   MCH 32.7 26.0 - 34.0 pg   MCHC 34.2 30.0 - 36.0 g/dL   RDW 13.014.3 86.511.5 - 78.415.5 %   Platelets 129 (L) 150 - 400 K/uL   nRBC 0.0 0.0 - 0.2 %    Comment: Performed at [facility], [address] [place], [place] [address]  SARS Coronavirus 2 (CEPHEID- Performed in [facility] hospital lab), Hosp Order     Status: None   Collection Time: 11/16/18  8:39 AM   Specimen: Nasopharyngeal Swab  Result Value Ref Range   SARS Coronavirus 2 NEGATIVE NEGATIVE    Comment: (NOTE) If result is NEGATIVE SARS-CoV-2 target nucleic acids are NOT DETECTED. The SARS-CoV-2 RNA is generally detectable in upper and lower  respiratory specimens during the acute phase of infection. The lowest  concentration of SARS-CoV-2 viral copies this assay can detect is 250  copies / mL. A negative result does not preclude SARS-CoV-2 infection  and should not be used as the sole basis for treatment or other  patient management decisions.  A negative result may occur with  improper specimen collection / handling, submission of specimen other  than nasopharyngeal swab, presence of viral mutation(s) within the  areas targeted by this assay, and inadequate number of viral copies  (<250 copies / mL). A negative result must be combined with clinical  observations, patient history, and epidemiological information. If result is POSITIVE SARS-CoV-2 target nucleic acids are DETECTED. The SARS-CoV-2 RNA is generally detectable in upper and lower  respiratory specimens dur ing the acute phase of infection.  Positive  results are indicative of active infection with SARS-CoV-2.  Clinical  correlation with patient history and other diagnostic information is  necessary to determine patient infection status.  Positive results do  not rule out bacterial infection or co-infection with other viruses. If result is PRESUMPTIVE POSTIVE SARS-CoV-2 nucleic acids MAY BE PRESENT.   A presumptive positive result was obtained  on the submitted specimen  and confirmed on repeat testing.  While 2019 novel coronavirus  (SARS-CoV-2) nucleic acids may be present in the submitted  sample  additional confirmatory testing may be necessary for epidemiological  and / or clinical management purposes  to differentiate between  SARS-CoV-2 and other Sarbecovirus currently known to infect humans.  If clinically indicated additional testing with an alternate test  methodology [phone]([id number]) is advised. The SARS-CoV-2 RNA is generally  detectable in upper and lower respiratory sp ecimens during the acute  phase of infection. The expected result is Negative. Fact Sheet for Patients:  BoilerBrush.com.cyhttps://www.fda.gov/media/136312/download Fact Sheet for Healthcare Providers: https://pope.com/https://www.fda.gov/media/136313/download This test is not yet approved or cleared by the [place] FDA and has been authorized for detection and/or diagnosis of SARS-CoV-2 by FDA under an Emergency Use Authorization (EUA).  This EUA will remain in effect (meaning this test can be used) for the duration of the COVID-19 declaration under Section 564(b)(1) of the Act, 21 U.S.C. section 360bbb-3(b)(1), unless the authorization is terminated or revoked sooner. Performed at [facility], [address] [place], [place] [address]   POC CBG, ED     Status: None   Collection Time: 11/16/18  2:25 PM  Result Value Ref Range   Glucose-Capillary 80 70 - 99 mg/dL   No results found.  Pending Labs Unresulted Labs (From admission, onward)    Start     Ordered   11/16/18 1329  HIV antibody (Routine Testing)  Once,   STAT     11/16/18 1329   11/16/18 0900  Vitamin B1  Once,   STAT     11/16/18 0859          Vitals/Pain Today's Vitals   11/16/18 1420 11/16/18 1436 11/16/18 1440 11/16/18 1500  BP: 119/87  121/83 128/90  Pulse: 90  95 95  Resp: 20  17 11   Temp:      TempSrc:      SpO2: 92%  93% 97%  PainSc:  0-No pain      Isolation  Precautions No active isolations  Medications Medications  LORazepam (ATIVAN) injection 0-4 mg (4 mg Intravenous Given 11/16/18 1308)    Or  LORazepam (ATIVAN) tablet 0-4 mg ( Oral See Alternative 11/16/18 1308)  LORazepam (ATIVAN) injection 0-4 mg (has no administration in time range)    Or  LORazepam (ATIVAN) tablet 0-4 mg (has no administration in time range)  thiamine (VITAMIN B-1) tablet 100 mg (100 mg Oral Given 11/16/18 0902)    Or  thiamine (B-1) injection 100 mg ( Intravenous See Alternative 11/16/18 0902)  ibuprofen (ADVIL) tablet 600 mg (has no administration in time range)  ondansetron (ZOFRAN) tablet 4 mg (has no administration in time range)  alum & mag hydroxide-simeth (MAALOX/MYLANTA) 200-200-20 MG/5ML suspension 30 mL (has no administration in time range)  folic acid (FOLVITE) tablet 1 mg (has no administration in time range)  escitalopram (LEXAPRO) tablet 20 mg (has no administration in time range)  enoxaparin (LOVENOX) injection 40 mg (has no administration in time range)  LORazepam (ATIVAN) injection 2 mg (has no administration in time range)  multivitamin with minerals tablet 1 tablet (1 tablet Oral Given 11/16/18 0902)  sodium chloride 0.9 % 1,000 mL with thiamine 100 mg, folic acid 1 mg, multivitamins adult 10 mL infusion ( Intravenous New Bag/Given 11/16/18 1459)    Mobility walks

## 2018-11-16 NOTE — ED Notes (Signed)
Patient stated patient is not hungry, however RN suggested patient attempt to consume snack. Patient is eating cheese stick and drinking sierra mist as snack.

## 2018-11-16 NOTE — ED Notes (Signed)
1st URINE REQUEST MADE 

## 2018-11-17 DIAGNOSIS — Z1159 Encounter for screening for other viral diseases: Secondary | ICD-10-CM | POA: Diagnosis not present

## 2018-11-17 DIAGNOSIS — F10239 Alcohol dependence with withdrawal, unspecified: Secondary | ICD-10-CM | POA: Diagnosis present

## 2018-11-17 DIAGNOSIS — R740 Nonspecific elevation of levels of transaminase and lactic acid dehydrogenase [LDH]: Secondary | ICD-10-CM | POA: Diagnosis present

## 2018-11-17 DIAGNOSIS — Z79899 Other long term (current) drug therapy: Secondary | ICD-10-CM | POA: Diagnosis not present

## 2018-11-17 DIAGNOSIS — R251 Tremor, unspecified: Secondary | ICD-10-CM | POA: Diagnosis present

## 2018-11-17 DIAGNOSIS — K709 Alcoholic liver disease, unspecified: Secondary | ICD-10-CM | POA: Diagnosis present

## 2018-11-17 DIAGNOSIS — R51 Headache: Secondary | ICD-10-CM | POA: Diagnosis present

## 2018-11-17 DIAGNOSIS — R748 Abnormal levels of other serum enzymes: Secondary | ICD-10-CM | POA: Diagnosis present

## 2018-11-17 DIAGNOSIS — F10229 Alcohol dependence with intoxication, unspecified: Secondary | ICD-10-CM | POA: Diagnosis present

## 2018-11-17 DIAGNOSIS — F329 Major depressive disorder, single episode, unspecified: Secondary | ICD-10-CM | POA: Diagnosis present

## 2018-11-17 DIAGNOSIS — K7030 Alcoholic cirrhosis of liver without ascites: Secondary | ICD-10-CM | POA: Diagnosis present

## 2018-11-17 DIAGNOSIS — F419 Anxiety disorder, unspecified: Secondary | ICD-10-CM | POA: Diagnosis present

## 2018-11-17 DIAGNOSIS — D696 Thrombocytopenia, unspecified: Secondary | ICD-10-CM | POA: Diagnosis present

## 2018-11-17 DIAGNOSIS — F40240 Claustrophobia: Secondary | ICD-10-CM | POA: Diagnosis present

## 2018-11-17 LAB — COMPREHENSIVE METABOLIC PANEL
ALT: 56 U/L — ABNORMAL HIGH (ref 0–44)
AST: 58 U/L — ABNORMAL HIGH (ref 15–41)
Albumin: 4.2 g/dL (ref 3.5–5.0)
Alkaline Phosphatase: 44 U/L (ref 38–126)
Anion gap: 10 (ref 5–15)
BUN: 14 mg/dL (ref 6–20)
CO2: 28 mmol/L (ref 22–32)
Calcium: 9.2 mg/dL (ref 8.9–10.3)
Chloride: 98 mmol/L (ref 98–111)
Creatinine, Ser: 0.73 mg/dL (ref 0.61–1.24)
GFR calc Af Amer: >60 mL/min (ref >60)
GFR calc non Af Amer: >60 mL/min (ref >60)
Glucose, Bld: 184 mg/dL — ABNORMAL HIGH (ref 70–99)
Potassium: 3.7 mmol/L (ref 3.5–5.1)
Sodium: 136 mmol/L (ref 135–145)
Total Bilirubin: 0.7 mg/dL (ref 0.3–1.2)
Total Protein: 6.8 g/dL (ref 6.5–8.1)

## 2018-11-17 LAB — CBC
HCT: 44.8 % (ref 39.0–52.0)
Hemoglobin: 14.3 g/dL (ref 13.0–17.0)
MCH: 31.6 pg (ref 26.0–34.0)
MCHC: 31.9 g/dL (ref 30.0–36.0)
MCV: 99.1 fL (ref 80.0–100.0)
Platelets: 120 10*3/uL — ABNORMAL LOW (ref 150–400)
RBC: 4.52 MIL/uL (ref 4.22–5.81)
RDW: 14.1 % (ref 11.5–15.5)
WBC: 4.4 10*3/uL (ref 4.0–10.5)
nRBC: 0 % (ref 0.0–0.2)

## 2018-11-17 LAB — MAGNESIUM: Magnesium: 2.2 mg/dL (ref 1.7–2.4)

## 2018-11-17 LAB — HIV ANTIBODY (ROUTINE TESTING W REFLEX): HIV Screen 4th Generation wRfx: NONREACTIVE

## 2018-11-17 MED ORDER — VITAMIN B-1 100 MG PO TABS
100.0000 mg | ORAL_TABLET | Freq: Every day | ORAL | Status: DC
  Administered 2018-11-18: 100 mg via ORAL
  Filled 2018-11-17: qty 1

## 2018-11-17 MED ORDER — LORAZEPAM 2 MG/ML IJ SOLN
3.0000 mg | Freq: Once | INTRAMUSCULAR | Status: AC
  Administered 2018-11-17: 3 mg via INTRAVENOUS

## 2018-11-17 MED ORDER — LORAZEPAM 2 MG/ML IJ SOLN: 0.0000 mg | Freq: Two times a day (BID) | INTRAMUSCULAR | Status: DC

## 2018-11-17 MED ORDER — LORAZEPAM 2 MG/ML IJ SOLN
0.0000 mg | Freq: Four times a day (QID) | INTRAMUSCULAR | Status: DC
  Administered 2018-11-17 (×2): 2 mg via INTRAVENOUS
  Administered 2018-11-18: 1 mg via INTRAVENOUS
  Filled 2018-11-17 (×3): qty 1

## 2018-11-17 MED ORDER — LORAZEPAM 2 MG/ML IJ SOLN
2.0000 mg | INTRAMUSCULAR | Status: DC | PRN
  Administered 2018-11-18: 2 mg via INTRAVENOUS
  Filled 2018-11-17: qty 1
  Filled 2018-11-17: qty 2

## 2018-11-17 MED ORDER — ADULT MULTIVITAMIN W/MINERALS CH
1.0000 | ORAL_TABLET | Freq: Every day | ORAL | Status: DC
  Administered 2018-11-18: 1 via ORAL
  Filled 2018-11-17: qty 1

## 2018-11-17 MED ORDER — THIAMINE HCL 100 MG/ML IJ SOLN
100.0000 mg | Freq: Every day | INTRAMUSCULAR | Status: DC
  Filled 2018-11-17: qty 2

## 2018-11-17 MED ORDER — FOLIC ACID 1 MG PO TABS
1.0000 mg | ORAL_TABLET | Freq: Every day | ORAL | Status: DC
  Administered 2018-11-18: 10:00:00 1 mg via ORAL
  Filled 2018-11-17: qty 1

## 2018-11-17 NOTE — Progress Notes (Signed)
Report given to ICU RN Urban Gibson, patient aware of transfer orders.  Belongings sent with patient

## 2018-11-17 NOTE — Progress Notes (Signed)
Patient wants to be discharged. I have provided education on risk/benefits of staying in the hospital. He is unhappy and agitated because his cell phone is dead and he does not have his charger, therefore, he can not call his family. No charger available, but trying to find one. He is confidential and does not want to use the phones from the hospital. Notified the charge nurse and provider on call. Awaiting call back.

## 2018-11-17 NOTE — Progress Notes (Signed)
PROGRESS NOTE    Jermaine MoanMichael Nevils  BJY:[id number]:[id number] DOB: [birth year] DOA: 11/16/2018 PCP: Patient, No Pcp Per   Brief Narrative:  Jermaine [name] is a 53 y.o. male with medical history significant of chronic alcohol abuse who presents the emergency department after he drank a lot of alcohol and was a scared for getting DTs. Patient has history of chronic alcohol abuse.  He remains sober for 2 years, went to alcohol rehabilitation but relapsed few weeks ago. He has anxiety and takes Lexapro at home.  He did not specify any clear reason why did he relapse.  He was also pretty sedated during my evaluation because he was given Ativan.Ethyl alcohol level was also significantly elevated on presentation.Patient denies any other medical problems. Patient seen and examined at the bedside in the emergency department.  Currently hemodynamically stable.  Drowsy/sleepy.  He says he drinks a large bottle of vodka every day. Patient denies any fever, chills, chest pain or shortness of breath, cough, abdominal pain, nausea, vomiting or diarrhea.  ED Course: CIWA was 20.  Started on CIWA protocol.  Started on banana bag, thiamine, folic acid.UDS negative. COVID-19 screening negative.  Assessment & Plan:   Principal Problem:   Alcohol abuse with intoxication (HCC) Active Problems:   Chronic alcohol abuse   Elevated liver enzymes   Thrombocytopenia (HCC)   Acute alcohol intoxication with alcoholism (HCC)  Alcohol abuse with intoxication: Drinks 1 large bottle of vodka every day.  Presented to the emergency department because he became scared of getting DTs. Continue ciwa May need precedex, continue to monitor.  High CIWA's recently.  Etoh cessation - social work for resources  MVI, thiamine, folic acid   Elevated liver enzymes: Secondary to chronic alcohol abuse.  Mild, improved today.  Thrombocytopenia: Likely Chronic .  Secondary to chronic alcohol abuse.  Continue to monitor.  History of anxiety:  Continue Lexapro.    DVT prophylaxis: lovenox Code Status: full code Family Communication: none, pt wants to keep this private Disposition Plan: pending improvement in withdrawal symptoms, requires inpatient monitoring given withdrawal   Consultants:   none  Procedures:  none  Antimicrobials:  Anti-infectives (From admission, onward)   None         Subjective: Last drink yesterday AM Wants to keep this private Feels like he's withdrawing Worried about work  Objective: Vitals:   11/16/18 2335 11/17/18 0523 11/17/18 0751 11/17/18 1316  BP: (!) 140/99 (!) 143/102 (!) 139/91 (!) 140/93  Pulse: 66 (!) 58 (!) 52 83  Resp: 18 16  16   Temp: 97.9 F (36.6 C) 97.8 F (36.6 C)  98.6 F (37 C)  TempSrc: Oral Oral  Oral  SpO2: 100% 99% 100% 98%  Weight:      Height:        Intake/Output Summary (Last 24 hours) at 11/17/2018 1410 Last data filed at 11/17/2018 1206 Gross per 24 hour  Intake 340 ml  Output 1425 ml  Net -1085 ml   Filed Weights   11/16/18 1708  Weight: 77.7 kg    Examination:  General exam: Appears calm and comfortable  Respiratory system: Clear to auscultation. Respiratory effort normal. Cardiovascular system: S1 & S2 heard, RRR.  Gastrointestinal system: Abdomen is nondistended, soft and nontender Central nervous system: Alert and oriented. Tremulous, appears uncomfortable and to be withdrawing.  No focal neurological deficits. Extremities: no lee. Skin: No rashes, lesions or ulcers Psychiatry: Judgement and insight appear normal. Mood & affect appropriate.     Data Reviewed: I  have personally reviewed following labs and imaging studies  CBC: Recent Labs  Lab 11/16/18 0839 11/17/18 0934  WBC 4.9 4.4  HGB 14.8 14.3  HCT 43.3 44.8  MCV 95.6 99.1  PLT 129* 854*   Basic Metabolic Panel: Recent Labs  Lab 11/16/18 0839 11/17/18 0934  NA 140 136  K 3.9 3.7  CL 98 98  CO2 29 28  GLUCOSE 105* 184*  BUN 19 14  CREATININE 0.79 0.73   CALCIUM 9.0 9.2  MG  --  2.2   GFR: Estimated Creatinine Clearance: 110.3 mL/min (by C-G formula based on SCr of 0.73 mg/dL). Liver Function Tests: Recent Labs  Lab 11/16/18 0839 11/17/18 0934  AST 79* 58*  ALT 65* 56*  ALKPHOS 63 44  BILITOT 0.3 0.7  PROT 7.3 6.8  ALBUMIN 4.7 4.2   No results for input(s): LIPASE, AMYLASE in the last 168 hours. No results for input(s): AMMONIA in the last 168 hours. Coagulation Profile: No results for input(s): INR, PROTIME in the last 168 hours. Cardiac Enzymes: No results for input(s): CKTOTAL, CKMB, CKMBINDEX, TROPONINI in the last 168 hours. BNP (last 3 results) No results for input(s): PROBNP in the last 8760 hours. HbA1C: No results for input(s): HGBA1C in the last 72 hours. CBG: Recent Labs  Lab 11/16/18 1425  GLUCAP 80   Lipid Profile: No results for input(s): CHOL, HDL, LDLCALC, TRIG, CHOLHDL, LDLDIRECT in the last 72 hours. Thyroid Function Tests: No results for input(s): TSH, T4TOTAL, FREET4, T3FREE, THYROIDAB in the last 72 hours. Anemia Panel: No results for input(s): VITAMINB12, FOLATE, FERRITIN, TIBC, IRON, RETICCTPCT in the last 72 hours. Sepsis Labs: No results for input(s): PROCALCITON, LATICACIDVEN in the last 168 hours.  Recent Results (from the past 240 hour(s))  SARS Coronavirus 2 (CEPHEID- Performed in [place] [facility] hospital lab), Hosp Order     Status: None   Collection Time: 11/16/18  8:39 AM   Specimen: Nasopharyngeal Swab  Result Value Ref Range Status   SARS Coronavirus 2 NEGATIVE NEGATIVE Final    Comment: (NOTE) If result is NEGATIVE SARS-CoV-2 target nucleic acids are NOT DETECTED. The SARS-CoV-2 RNA is generally detectable in upper and lower  respiratory specimens during the acute phase of infection. The lowest  concentration of SARS-CoV-2 viral copies this assay can detect is 250  copies / mL. A negative result does not preclude SARS-CoV-2 infection  and should not be used as the sole basis for  treatment or other  patient management decisions.  A negative result may occur with  improper specimen collection / handling, submission of specimen other  than nasopharyngeal swab, presence of viral mutation(s) within the  areas targeted by this assay, and inadequate number of viral copies  (<250 copies / mL). A negative result must be combined with clinical  observations, patient history, and epidemiological information. If result is POSITIVE SARS-CoV-2 target nucleic acids are DETECTED. The SARS-CoV-2 RNA is generally detectable in upper and lower  respiratory specimens dur ing the acute phase of infection.  Positive  results are indicative of active infection with SARS-CoV-2.  Clinical  correlation with patient history and other diagnostic information is  necessary to determine patient infection status.  Positive results do  not rule out bacterial infection or co-infection with other viruses. If result is PRESUMPTIVE POSTIVE SARS-CoV-2 nucleic acids MAY BE PRESENT.   A presumptive positive result was obtained on the submitted specimen  and confirmed on repeat testing.  While 2019 novel coronavirus  (SARS-CoV-2) nucleic acids  may be present in the submitted sample  additional confirmatory testing may be necessary for epidemiological  and / or clinical management purposes  to differentiate between  SARS-CoV-2 and other Sarbecovirus currently known to infect humans.  If clinically indicated additional testing with an alternate test  methodology [phone]([id number]) is advised. The SARS-CoV-2 RNA is generally  detectable in upper and lower respiratory sp ecimens during the acute  phase of infection. The expected result is Negative. Fact Sheet for Patients:  BoilerBrush.com.cyhttps://www.fda.gov/media/136312/download Fact Sheet for Healthcare Providers: https://pope.com/https://www.fda.gov/media/136313/download This test is not yet approved or cleared by the [place] FDA and has been authorized for detection and/or  diagnosis of SARS-CoV-2 by FDA under an Emergency Use Authorization (EUA).  This EUA will remain in effect (meaning this test can be used) for the duration of the COVID-19 declaration under Section 564(b)(1) of the Act, 21 U.S.C. section 360bbb-3(b)(1), unless the authorization is terminated or revoked sooner. Performed at [facility], [address] [place], [place] [address]          Radiology Studies: No results found.      Scheduled Meds: . enoxaparin (LOVENOX) injection  40 mg Subcutaneous Q24H  . escitalopram  20 mg Oral Daily  . folic acid  1 mg Oral Daily  . LORazepam  0-4 mg Intravenous Q6H   Or  . LORazepam  0-4 mg Oral Q6H  . [START ON 11/18/2018] LORazepam  0-4 mg Intravenous Q12H   Or  . [START ON 11/18/2018] LORazepam  0-4 mg Oral Q12H  . thiamine  100 mg Oral Daily   Or  . thiamine  100 mg Intravenous Daily   Continuous Infusions:   LOS: 0 days    Time spent: over 30 min    Lacretia Nicksaldwell Powell, MD [facility] Pager AMION  If 7PM-7AM, please contact night-coverage [email or web address] Password [email or web address] 11/17/2018, 2:10 PM

## 2018-11-17 NOTE — TOC Progression Note (Signed)
Transition of Care [facility]) - Progression Note    Patient Details  Name: Jermaine [name] MRN: [id number] Date of Birth: December 10, 1965  [facility]) CM/SW Contact  , Juliann Pulse, RN Phone Number: 11/17/2018, 1:10 PM  Clinical Narrative: Patient from home alone. Has pcp,pharmacy,transp home.States he has resources available to help with his alcohol issue.No health insurance-seen by Development worker, community.  No further CM needs.         Expected Discharge Plan and Services                                                 Social Determinants of Health (SDOH) Interventions    Readmission Risk Interventions No flowsheet data found.

## 2018-11-18 LAB — CBC
HCT: 41.7 % (ref 39.0–52.0)
Hemoglobin: 13.6 g/dL (ref 13.0–17.0)
MCH: 32.2 pg (ref 26.0–34.0)
MCHC: 32.6 g/dL (ref 30.0–36.0)
MCV: 98.6 fL (ref 80.0–100.0)
Platelets: 111 10*3/uL — ABNORMAL LOW (ref 150–400)
RBC: 4.23 MIL/uL (ref 4.22–5.81)
RDW: 14.1 % (ref 11.5–15.5)
WBC: 4.7 10*3/uL (ref 4.0–10.5)
nRBC: 0 % (ref 0.0–0.2)

## 2018-11-18 LAB — COMPREHENSIVE METABOLIC PANEL
ALT: 54 U/L — ABNORMAL HIGH (ref 0–44)
AST: 53 U/L — ABNORMAL HIGH (ref 15–41)
Albumin: 4.3 g/dL (ref 3.5–5.0)
Alkaline Phosphatase: 46 U/L (ref 38–126)
Anion gap: 10 (ref 5–15)
BUN: 15 mg/dL (ref 6–20)
CO2: 27 mmol/L (ref 22–32)
Calcium: 9.1 mg/dL (ref 8.9–10.3)
Chloride: 100 mmol/L (ref 98–111)
Creatinine, Ser: 0.75 mg/dL (ref 0.61–1.24)
GFR calc Af Amer: >60 mL/min (ref >60)
GFR calc non Af Amer: >60 mL/min (ref >60)
Glucose, Bld: 107 mg/dL — ABNORMAL HIGH (ref 70–99)
Potassium: 3.7 mmol/L (ref 3.5–5.1)
Sodium: 137 mmol/L (ref 135–145)
Total Bilirubin: 0.7 mg/dL (ref 0.3–1.2)
Total Protein: 7 g/dL (ref 6.5–8.1)

## 2018-11-18 LAB — MAGNESIUM: Magnesium: 2.4 mg/dL (ref 1.7–2.4)

## 2018-11-18 MED ORDER — CHLORDIAZEPOXIDE HCL 25 MG PO CAPS: ORAL_CAPSULE | ORAL | 0 refills | Status: DC

## 2018-11-18 MED ORDER — CHLORHEXIDINE GLUCONATE CLOTH 2 % EX PADS
6.0000 | MEDICATED_PAD | Freq: Every day | CUTANEOUS | Status: DC
  Administered 2018-11-18: 6 via TOPICAL

## 2018-11-18 MED ORDER — ORAL CARE MOUTH RINSE
15.0000 mL | Freq: Two times a day (BID) | OROMUCOSAL | Status: DC
  Administered 2018-11-18: 10:00:00 15 mL via OROMUCOSAL

## 2018-11-18 NOTE — Discharge Summary (Signed)
Pt left AMA.  Evaluated at bedside, Denzel Etienne&ox3, discussed risks of etoh withdrawal including seizure, death, etc.  Pt expresses that he still would like to leave.  His intention is still for alcohol cessation right now.  Will prescribe librium.  Encouraged outpatient follow up with outpatient substance abuse resources (he stated he knew these).

## 2018-11-18 NOTE — Progress Notes (Signed)
[place] Progress Note Patient Name: Jermaine [name] DOB: 1966-04-23 MRN: [id number]   Date of Service  11/18/2018  HPI/Events of Note  Notified by ICU RN that patient was pulling off his leads.  He was getting up from the bed.  Pt was given Ativan 2mg  and he was then allowing the RNs to apply the leads.  At the time of my video assessment, the patient was cooperative.  Pt was reoriented and expressed that he needs help getting his job back.  eICU Interventions  Continue per CIWA protocol.      Intervention Category Minor Interventions: Agitation / anxiety - evaluation and management  Elsie Lincoln 11/18/2018, 3:44 AM

## 2018-11-18 NOTE — Progress Notes (Addendum)
PROGRESS NOTE    Jermaine [name]  HAL:[id number] DOB: 05-22-1966 DOA: 11/16/2018 PCP: London Pepper, MD   Brief Narrative:  Jermaine [name] is Jermaine [name] 53 y.o. male with medical history significant of chronic alcohol abuse who presents the emergency department after he drank Jermaine [name] lot of alcohol and was Jermaine [name] scared for getting DTs. Patient has history of chronic alcohol abuse.  He remains sober for 2 years, went to alcohol rehabilitation but relapsed few weeks ago. He has anxiety and takes Lexapro at home.  He did not specify any clear reason why did he relapse.  He was also pretty sedated during my evaluation because he was given Ativan.Ethyl alcohol level was also significantly elevated on presentation.Patient denies any other medical problems. Patient seen and examined at the bedside in the emergency department.  Currently hemodynamically stable.  Drowsy/sleepy.  He says he drinks Zavior Thomason large bottle of vodka every day. Patient denies any fever, chills, chest pain or shortness of breath, cough, abdominal pain, nausea, vomiting or diarrhea.  ED Course: CIWA was 20.  Started on CIWA protocol.  Started on banana bag, thiamine, folic acid.UDS negative. COVID-19 screening negative.  Assessment & Plan:   Principal Problem:   Alcohol abuse with intoxication ([place]) Active Problems:   Chronic alcohol abuse   Elevated liver enzymes   Thrombocytopenia (HCC)   Acute alcohol intoxication with alcoholism ([place])   Alcohol abuse with intoxication: Drinks 1 large bottle of vodka every day.  Presented to the emergency department because he became scared of getting DTs. Last drink 6/21 AM Continue ciwa, last 0, but had high scores in the 20's and teens overnight.  He's mildly confused this morning. May need precedex, continue to monitor.  High CIWA's recently.  Etoh cessation - social work for resources  MVI, thiamine, folic acid   Elevated liver enzymes: Secondary to chronic alcohol abuse.  Mild, improved today.   Thrombocytopenia: Likely Chronic .  Secondary to chronic alcohol abuse.  Continue to monitor.  History of anxiety: Continue Lexapro.  DVT prophylaxis: lovenox Code Status: full code Family Communication: none, pt wants to keep this private Disposition Plan: pending improvement in withdrawal symptoms, requires inpatient monitoring given withdrawal   Consultants:   none  Procedures:  none  Antimicrobials:  Anti-infectives (From admission, onward)   None         Subjective: Jermaine [name] bit confused this monring Feels like he's been here forever Jermaine [name]&Ox3, but slow with his responses  Objective: Vitals:   11/18/18 0437 11/18/18 0500 11/18/18 0600 11/18/18 0800  BP:  133/85 110/73 116/76  Pulse: 63 71 64 63  Resp: 16 19 16 17   Temp:    97.7 F (36.5 C)  TempSrc:    Oral  SpO2: 98% 93% 96% 94%  Weight:      Height:        Intake/Output Summary (Last 24 hours) at 11/18/2018 1112 Last data filed at 11/17/2018 2200 Gross per 24 hour  Intake 480 ml  Output 900 ml  Net -420 ml   Filed Weights   11/16/18 1708  Weight: 77.7 kg    Examination:  General: No acute distress. Cardiovascular: Heart sounds show Jermaine [name] regular rate, and rhythm.  Lungs: Clear to auscultation bilaterally Abdomen: Soft, nontender, nondistended  Neurological: Alert and oriented 3. Moves all extremities 4. Cranial nerves II through XII grossly intact. Skin: Warm and dry. No rashes or lesions. Extremities: No clubbing or cyanosis. No edema. Psychiatric: Mood and affect are normal. Insight and judgment are appropriate.  Data Reviewed: I have personally reviewed following labs and imaging studies  CBC: Recent Labs  Lab 11/16/18 0839 11/17/18 0934 11/18/18 0216  WBC 4.9 4.4 4.7  HGB 14.8 14.3 13.6  HCT 43.3 44.8 41.7  MCV 95.6 99.1 98.6  PLT 129* 120* 111*   Basic Metabolic Panel: Recent Labs  Lab 11/16/18 0839 11/17/18 0934 11/18/18 0216  NA 140 136 137  K 3.9 3.7 3.7  CL 98 98 100   CO2 29 28 27   GLUCOSE 105* 184* 107*  BUN 19 14 15   CREATININE 0.79 0.73 0.75  CALCIUM 9.0 9.2 9.1  MG  --  2.2 2.4   GFR: Estimated Creatinine Clearance: 110.3 mL/min (by C-G formula based on SCr of 0.75 mg/dL). Liver Function Tests: Recent Labs  Lab 11/16/18 0839 11/17/18 0934 11/18/18 0216  AST 79* 58* 53*  ALT 65* 56* 54*  ALKPHOS 63 44 46  BILITOT 0.3 0.7 0.7  PROT 7.3 6.8 7.0  ALBUMIN 4.7 4.2 4.3   No results for input(s): LIPASE, AMYLASE in the last 168 hours. No results for input(s): AMMONIA in the last 168 hours. Coagulation Profile: No results for input(s): INR, PROTIME in the last 168 hours. Cardiac Enzymes: No results for input(s): CKTOTAL, CKMB, CKMBINDEX, TROPONINI in the last 168 hours. BNP (last 3 results) No results for input(s): PROBNP in the last 8760 hours. HbA1C: No results for input(s): HGBA1C in the last 72 hours. CBG: Recent Labs  Lab 11/16/18 1425  GLUCAP 80   Lipid Profile: No results for input(s): CHOL, HDL, LDLCALC, TRIG, CHOLHDL, LDLDIRECT in the last 72 hours. Thyroid Function Tests: No results for input(s): TSH, T4TOTAL, FREET4, T3FREE, THYROIDAB in the last 72 hours. Anemia Panel: No results for input(s): VITAMINB12, FOLATE, FERRITIN, TIBC, IRON, RETICCTPCT in the last 72 hours. Sepsis Labs: No results for input(s): PROCALCITON, LATICACIDVEN in the last 168 hours.  Recent Results (from the past 240 hour(s))  SARS Coronavirus 2 (CEPHEID- Performed in [facility] lab), Hosp Order     Status: None   Collection Time: 11/16/18  8:39 AM   Specimen: Nasopharyngeal Swab  Result Value Ref Range Status   SARS Coronavirus 2 NEGATIVE NEGATIVE Final    Comment: (NOTE) If result is NEGATIVE SARS-CoV-2 target nucleic acids are NOT DETECTED. The SARS-CoV-2 RNA is generally detectable in upper and lower  respiratory specimens during the acute phase of infection. The lowest  concentration of SARS-CoV-2 viral copies this assay can  detect is 250  copies / mL. Shay Jhaveri negative result does not preclude SARS-CoV-2 infection  and should not be used as the sole basis for treatment or other  patient management decisions.  Nehemiah Montee negative result may occur with  improper specimen collection / handling, submission of specimen other  than nasopharyngeal swab, presence of viral mutation(s) within the  areas targeted by this assay, and inadequate number of viral copies  (<250 copies / mL). Caragh Gasper negative result must be combined with clinical  observations, patient history, and epidemiological information. If result is POSITIVE SARS-CoV-2 target nucleic acids are DETECTED. The SARS-CoV-2 RNA is generally detectable in upper and lower  respiratory specimens dur ing the acute phase of infection.  Positive  results are indicative of active infection with SARS-CoV-2.  Clinical  correlation with patient history and other diagnostic information is  necessary to determine patient infection status.  Positive results do  not rule out bacterial infection or co-infection with other viruses. If result is PRESUMPTIVE POSTIVE SARS-CoV-2 nucleic acids MAY  BE PRESENT.   Latrese Carolan presumptive positive result was obtained on the submitted specimen  and confirmed on repeat testing.  While 2019 novel coronavirus  (SARS-CoV-2) nucleic acids may be present in the submitted sample  additional confirmatory testing may be necessary for epidemiological  and / or clinical management purposes  to differentiate between  SARS-CoV-2 and other Sarbecovirus currently known to infect humans.  If clinically indicated additional testing with an alternate test  methodology [id number]) is advised. The SARS-CoV-2 RNA is generally  detectable in upper and lower respiratory sp ecimens during the acute  phase of infection. The expected result is Negative. Fact Sheet for Patients:  BoilerBrush.com.cyhttps://www.fda.gov/media/136312/download Fact Sheet for Healthcare Providers:  https://pope.com/https://www.fda.gov/media/136313/download This test is not yet approved or cleared by the [place] FDA and has been authorized for detection and/or diagnosis of SARS-CoV-2 by FDA under an Emergency Use Authorization (EUA).  This EUA will remain in effect (meaning this test can be used) for the duration of the COVID-19 declaration under Section 564(b)(1) of the Act, 21 U.S.C. section 360bbb-3(b)(1), unless the authorization is terminated or revoked sooner. Performed at [facility], [address] [place], [place] [address]          Radiology Studies: No results found.      Scheduled Meds: . Chlorhexidine Gluconate Cloth  6 each Topical Daily  . enoxaparin (LOVENOX) injection  40 mg Subcutaneous Q24H  . escitalopram  20 mg Oral Daily  . folic acid  1 mg Oral Daily  . LORazepam  0-4 mg Intravenous Q6H   Followed by  . [START ON 11/19/2018] LORazepam  0-4 mg Intravenous Q12H  . mouth rinse  15 mL Mouth Rinse BID  . multivitamin with minerals  1 tablet Oral Daily  . thiamine  100 mg Oral Daily   Or  . thiamine  100 mg Intravenous Daily   Continuous Infusions:   LOS: 1 day    Time spent: over 30 min    Lacretia Nicksaldwell Powell, MD [facility] Pager AMION  If 7PM-7AM, please contact night-coverage [email or web address] Password [email or web address] 11/18/2018, 11:12 AM

## 2018-11-18 NOTE — Progress Notes (Signed)
Pt requesting to leave AMA, MD Florene Glen was paged and came to run to talk to patient. Patient is leaving AMA paper was signed, patient was walked out.

## 2018-11-19 ENCOUNTER — Inpatient Hospital Stay (HOSPITAL_COMMUNITY)
Admission: EM | Admit: 2018-11-19 | Discharge: 2018-11-23 | Disposition: A | Discharge status: Home / Self Care | Payer: BC Managed Care – PPO | Source: Home / Self Care | Attending: Internal Medicine | Admitting: Internal Medicine

## 2018-11-19 ENCOUNTER — Other Ambulatory Visit: Payer: Self-pay

## 2018-11-19 ENCOUNTER — Encounter (HOSPITAL_COMMUNITY): Payer: Self-pay

## 2018-11-19 ENCOUNTER — Emergency Department (HOSPITAL_COMMUNITY): Payer: BC Managed Care – PPO

## 2018-11-19 DIAGNOSIS — F1010 Alcohol abuse, uncomplicated: Secondary | ICD-10-CM | POA: Diagnosis present

## 2018-11-19 DIAGNOSIS — F32A Depression, unspecified: Secondary | ICD-10-CM | POA: Diagnosis present

## 2018-11-19 DIAGNOSIS — D696 Thrombocytopenia, unspecified: Secondary | ICD-10-CM | POA: Diagnosis present

## 2018-11-19 DIAGNOSIS — Z20828 Contact with and (suspected) exposure to other viral communicable diseases: Secondary | ICD-10-CM

## 2018-11-19 DIAGNOSIS — F329 Major depressive disorder, single episode, unspecified: Secondary | ICD-10-CM | POA: Diagnosis present

## 2018-11-19 DIAGNOSIS — K7030 Alcoholic cirrhosis of liver without ascites: Secondary | ICD-10-CM | POA: Diagnosis present

## 2018-11-19 DIAGNOSIS — F10939 Alcohol use, unspecified with withdrawal, unspecified: Secondary | ICD-10-CM

## 2018-11-19 DIAGNOSIS — F10239 Alcohol dependence with withdrawal, unspecified: Secondary | ICD-10-CM | POA: Diagnosis present

## 2018-11-19 DIAGNOSIS — K760 Fatty (change of) liver, not elsewhere classified: Secondary | ICD-10-CM

## 2018-11-19 DIAGNOSIS — R748 Abnormal levels of other serum enzymes: Secondary | ICD-10-CM | POA: Diagnosis present

## 2018-11-19 DIAGNOSIS — F10230 Alcohol dependence with withdrawal, uncomplicated: Secondary | ICD-10-CM

## 2018-11-19 DIAGNOSIS — G9341 Metabolic encephalopathy: Secondary | ICD-10-CM | POA: Diagnosis present

## 2018-11-19 HISTORY — DX: Anxiety disorder, unspecified: F41.9

## 2018-11-19 HISTORY — DX: Depression, unspecified: F32.A

## 2018-11-19 LAB — CBC WITH DIFFERENTIAL/PLATELET
Abs Immature Granulocytes: 0.03 K/uL (ref 0.00–0.07)
Basophils Absolute: 0 K/uL (ref 0.0–0.1)
Basophils Relative: 0 %
Eosinophils Absolute: 0 K/uL (ref 0.0–0.5)
Eosinophils Relative: 1 %
HCT: 43.6 % (ref 39.0–52.0)
Hemoglobin: 14.5 g/dL (ref 13.0–17.0)
Immature Granulocytes: 1 %
Lymphocytes Relative: 25 %
Lymphs Abs: 1.2 K/uL (ref 0.7–4.0)
MCH: 32.6 pg (ref 26.0–34.0)
MCHC: 33.3 g/dL (ref 30.0–36.0)
MCV: 98 fL (ref 80.0–100.0)
Monocytes Absolute: 0.4 K/uL (ref 0.1–1.0)
Monocytes Relative: 9 %
Neutro Abs: 3.1 K/uL (ref 1.7–7.7)
Neutrophils Relative %: 64 %
Platelets: 116 K/uL — ABNORMAL LOW (ref 150–400)
RBC: 4.45 MIL/uL (ref 4.22–5.81)
RDW: 14.2 % (ref 11.5–15.5)
WBC: 4.8 K/uL (ref 4.0–10.5)
nRBC: 0 % (ref 0.0–0.2)

## 2018-11-19 LAB — COMPREHENSIVE METABOLIC PANEL WITH GFR
ALT: 113 U/L — ABNORMAL HIGH (ref 0–44)
AST: 107 U/L — ABNORMAL HIGH (ref 15–41)
Albumin: 4.6 g/dL (ref 3.5–5.0)
Alkaline Phosphatase: 45 U/L (ref 38–126)
Anion gap: 10 (ref 5–15)
BUN: 15 mg/dL (ref 6–20)
CO2: 25 mmol/L (ref 22–32)
Calcium: 9.2 mg/dL (ref 8.9–10.3)
Chloride: 103 mmol/L (ref 98–111)
Creatinine, Ser: 0.79 mg/dL (ref 0.61–1.24)
GFR calc Af Amer: >60 mL/min (ref >60)
GFR calc non Af Amer: >60 mL/min (ref >60)
Glucose, Bld: 87 mg/dL (ref 70–99)
Potassium: 3.6 mmol/L (ref 3.5–5.1)
Sodium: 138 mmol/L (ref 135–145)
Total Bilirubin: 1 mg/dL (ref 0.3–1.2)
Total Protein: 7.4 g/dL (ref 6.5–8.1)

## 2018-11-19 LAB — URINALYSIS, ROUTINE W REFLEX MICROSCOPIC
Bilirubin Urine: NEGATIVE
Glucose, UA: NEGATIVE mg/dL
Hgb urine dipstick: NEGATIVE
Ketones, ur: NEGATIVE mg/dL
Leukocytes,Ua: NEGATIVE
Nitrite: NEGATIVE
Protein, ur: NEGATIVE mg/dL
Specific Gravity, Urine: 1.015 (ref 1.005–1.030)
pH: 5 (ref 5.0–8.0)

## 2018-11-19 LAB — CBC
HCT: 42 % (ref 39.0–52.0)
Hemoglobin: 14.1 g/dL (ref 13.0–17.0)
MCH: 33.3 pg (ref 26.0–34.0)
MCHC: 33.6 g/dL (ref 30.0–36.0)
MCV: 99.3 fL (ref 80.0–100.0)
Platelets: 113 10*3/uL — ABNORMAL LOW (ref 150–400)
RBC: 4.23 MIL/uL (ref 4.22–5.81)
RDW: 14.3 % (ref 11.5–15.5)
WBC: 5.1 10*3/uL (ref 4.0–10.5)
nRBC: 0 % (ref 0.0–0.2)

## 2018-11-19 LAB — SARS CORONAVIRUS 2 BY RT PCR (HOSPITAL ORDER, PERFORMED IN ~~LOC~~ HOSPITAL LAB): SARS Coronavirus 2: NEGATIVE

## 2018-11-19 LAB — CREATININE, SERUM
Creatinine, Ser: 0.75 mg/dL (ref 0.61–1.24)
GFR calc Af Amer: >60 mL/min (ref >60)
GFR calc non Af Amer: >60 mL/min (ref >60)

## 2018-11-19 LAB — LIPASE, BLOOD: Lipase: 32 U/L (ref 11–51)

## 2018-11-19 LAB — ETHANOL: Alcohol, Ethyl (B): <10 mg/dL (ref <10)

## 2018-11-19 LAB — MAGNESIUM: Magnesium: 2.2 mg/dL (ref 1.7–2.4)

## 2018-11-19 MED ORDER — ONDANSETRON HCL 4 MG PO TABS
4.0000 mg | ORAL_TABLET | Freq: Four times a day (QID) | ORAL | Status: DC | PRN
  Administered 2018-11-19: 4 mg via ORAL
  Filled 2018-11-19: qty 1

## 2018-11-19 MED ORDER — ACETAMINOPHEN 650 MG RE SUPP: 650.0000 mg | Freq: Four times a day (QID) | RECTAL | Status: DC | PRN

## 2018-11-19 MED ORDER — ESCITALOPRAM OXALATE 20 MG PO TABS
20.0000 mg | ORAL_TABLET | Freq: Every day | ORAL | Status: DC
  Administered 2018-11-19 – 2018-11-23 (×5): 20 mg via ORAL
  Filled 2018-11-19 (×5): qty 1

## 2018-11-19 MED ORDER — ACETAMINOPHEN 325 MG PO TABS: 650.0000 mg | ORAL_TABLET | Freq: Four times a day (QID) | ORAL | Status: DC | PRN

## 2018-11-19 MED ORDER — ENOXAPARIN SODIUM 40 MG/0.4ML ~~LOC~~ SOLN
40.0000 mg | SUBCUTANEOUS | Status: DC
  Administered 2018-11-19 – 2018-11-22 (×3): 40 mg via SUBCUTANEOUS
  Filled 2018-11-19 (×4): qty 0.4

## 2018-11-19 MED ORDER — ONDANSETRON HCL 4 MG/2ML IJ SOLN: 4.0000 mg | Freq: Four times a day (QID) | INTRAMUSCULAR | Status: DC | PRN

## 2018-11-19 MED ORDER — ADULT MULTIVITAMIN W/MINERALS CH
1.0000 | ORAL_TABLET | Freq: Every day | ORAL | Status: DC
  Administered 2018-11-19 – 2018-11-23 (×5): 1 via ORAL
  Filled 2018-11-19 (×5): qty 1

## 2018-11-19 MED ORDER — POTASSIUM CHLORIDE IN NACL 20-0.9 MEQ/L-% IV SOLN
INTRAVENOUS | Status: DC
  Administered 2018-11-19 (×2): via INTRAVENOUS
  Filled 2018-11-19 (×3): qty 1000

## 2018-11-19 MED ORDER — LORAZEPAM 1 MG PO TABS
1.0000 mg | ORAL_TABLET | ORAL | Status: DC | PRN
  Administered 2018-11-19 – 2018-11-23 (×11): 1 mg via ORAL
  Filled 2018-11-19 (×11): qty 1

## 2018-11-19 MED ORDER — LORAZEPAM 2 MG/ML IJ SOLN: 1.0000 mg | Freq: Four times a day (QID) | INTRAMUSCULAR | Status: DC | PRN

## 2018-11-19 MED ORDER — FOLIC ACID 1 MG PO TABS
1.0000 mg | ORAL_TABLET | Freq: Every day | ORAL | Status: DC
  Administered 2018-11-19 – 2018-11-23 (×5): 1 mg via ORAL
  Filled 2018-11-19 (×5): qty 1

## 2018-11-19 MED ORDER — ORAL CARE MOUTH RINSE
15.0000 mL | Freq: Two times a day (BID) | OROMUCOSAL | Status: DC
  Administered 2018-11-19 – 2018-11-21 (×4): 15 mL via OROMUCOSAL

## 2018-11-19 MED ORDER — CHLORHEXIDINE GLUCONATE CLOTH 2 % EX PADS
6.0000 | MEDICATED_PAD | Freq: Every day | CUTANEOUS | Status: DC
  Administered 2018-11-20: 09:00:00 6 via TOPICAL

## 2018-11-19 MED ORDER — CHLORDIAZEPOXIDE HCL 25 MG PO CAPS
25.0000 mg | ORAL_CAPSULE | Freq: Three times a day (TID) | ORAL | Status: DC
  Administered 2018-11-19 (×2): 25 mg via ORAL
  Filled 2018-11-19 (×2): qty 1

## 2018-11-19 MED ORDER — SODIUM CHLORIDE 0.9% FLUSH
3.0000 mL | Freq: Two times a day (BID) | INTRAVENOUS | Status: DC
  Administered 2018-11-19 – 2018-11-23 (×3): 3 mL via INTRAVENOUS

## 2018-11-19 MED ORDER — THIAMINE HCL 100 MG/ML IJ SOLN
100.0000 mg | Freq: Once | INTRAMUSCULAR | Status: AC
  Administered 2018-11-19: 100 mg via INTRAVENOUS
  Filled 2018-11-19: qty 2

## 2018-11-19 MED ORDER — VITAMIN B-1 100 MG PO TABS
100.0000 mg | ORAL_TABLET | Freq: Every day | ORAL | Status: DC
  Administered 2018-11-19 – 2018-11-23 (×5): 100 mg via ORAL
  Filled 2018-11-19 (×5): qty 1

## 2018-11-19 MED ORDER — SODIUM CHLORIDE 0.9 % IV BOLUS
1000.0000 mL | Freq: Once | INTRAVENOUS | Status: AC
  Administered 2018-11-19: 1000 mL via INTRAVENOUS

## 2018-11-19 MED ORDER — LORAZEPAM 2 MG/ML IJ SOLN
1.0000 mg | Freq: Once | INTRAMUSCULAR | Status: AC
  Administered 2018-11-19: 1 mg via INTRAVENOUS
  Filled 2018-11-19: qty 1

## 2018-11-19 MED ORDER — LORAZEPAM 2 MG/ML IJ SOLN: 1.0000 mg | INTRAMUSCULAR | Status: DC | PRN

## 2018-11-19 MED ORDER — THIAMINE HCL 100 MG/ML IJ SOLN: 100.0000 mg | Freq: Every day | INTRAMUSCULAR | Status: DC

## 2018-11-19 MED ORDER — LORAZEPAM 1 MG PO TABS: 1.0000 mg | ORAL_TABLET | Freq: Four times a day (QID) | ORAL | Status: DC | PRN

## 2018-11-19 NOTE — ED Notes (Signed)
Attempted to call report at this time 

## 2018-11-19 NOTE — ED Triage Notes (Signed)
Patient reports that he was discharged from the hospital yesterday for DT's yesterday. Patient  States he drank a fifth of liquor last night.  Patient states, "I came in today because I feel "peakish and feverish."

## 2018-11-19 NOTE — ED Notes (Signed)
ED TO INPATIENT HANDOFF REPORT  Name/Age/Gender Jermaine MoanMichael [name] 53 y.o. male  Code Status Code Status History    Date Active Date Inactive Code Status Order ID Comments User Context   11/16/2018 1329 11/18/2018 1846 Full Code [id number]  Burnadette PopAdhikari, Amrit, MD ED   11/16/2018 0803 11/16/2018 1329 Full Code [id number]  Alden HippMurphy, Laura A, PA-C ED   Advance Care Planning Activity      Home/SNF/Other Home  Chief Complaint chest pain / short of breath   Level of Care/Admitting Diagnosis ED Disposition    ED Disposition Condition Comment   Admit  [facility] [[id number]]  Level of Care: Stepdown [14]  Admit to SDU based on following criteria: Other see comments  Comments: Alcohol withdrawal.  Concern of DTs and requirement of Precedex  Covid Evaluation: N/A  Diagnosis: Alcohol withdrawal (HCC) [291.81.ICD-9-CM]  Admitting Physician: Hughie ClossHWANI, RAVI [[id number]]  Attending Physician: Hughie ClossPAHWANI, RAVI [phone][[id number]]  PT Class (Do Not Modify): Observation [104]  PT Acc Code (Do Not Modify): Observation [10022]       Medical History Past Medical History:  Diagnosis Date  . Alcohol abuse   . Anxiety   . Depression     Allergies No Known Allergies  IV Location/Drains/Wounds Patient Lines/Drains/Airways Status   Active Line/Drains/Airways    Name:   Placement date:   Placement time:   Site:   Days:   Peripheral IV 11/19/18 Left Forearm   11/19/18    0914    Forearm   less than 1          Labs/Imaging Results for orders placed or performed during the hospital encounter of 11/19/18 (from the past 48 hour(s))  Comprehensive metabolic panel     Status: Abnormal   Collection Time: 11/19/18  9:21 AM  Result Value Ref Range   Sodium 138 135 - 145 mmol/L   Potassium 3.6 3.5 - 5.1 mmol/L   Chloride 103 98 - 111 mmol/L   CO2 25 22 - 32 mmol/L   Glucose, Bld 87 70 - 99 mg/dL   BUN 15 6 - 20 mg/dL   Creatinine, Ser 2.720.79 0.61 - 1.24 mg/dL   Calcium 9.2 8.9 - 53.610.3  mg/dL   Total Protein 7.4 6.5 - 8.1 g/dL   Albumin 4.6 3.5 - 5.0 g/dL   AST 644107 (H) 15 - 41 U/L   ALT 113 (H) 0 - 44 U/L   Alkaline Phosphatase 45 38 - 126 U/L   Total Bilirubin 1.0 0.3 - 1.2 mg/dL   GFR calc non Af Amer >60 >60 mL/min   GFR calc Af Amer >60 >60 mL/min   Anion gap 10 5 - 15    Comment: Performed at [facility], [address] [place], [place] [address]  Ethanol     Status: None   Collection Time: 11/19/18  9:21 AM  Result Value Ref Range   Alcohol, Ethyl (B) <10 <10 mg/dL    Comment: (NOTE) Lowest detectable limit for serum alcohol is 10 mg/dL. For medical purposes only. Performed at [facility], [address] [place], [place] [address]   Lipase, blood     Status: None   Collection Time: 11/19/18  9:21 AM  Result Value Ref Range   Lipase 32 11 - 51 U/L    Comment: Performed at [facility], [address] [place], [place] [address]  CBC with Differential     Status: Abnormal   Collection Time: 11/19/18  9:21 AM  Result Value Ref Range   WBC 4.8 4.0 - 10.5 K/uL   RBC 4.45 4.22 - 5.81 MIL/uL   Hemoglobin 14.5 13.0 - 17.0 g/dL   HCT 43.6 39.0 - 52.0 %   MCV 98.0 80.0 - 100.0 fL   MCH 32.6 26.0 - 34.0 pg   MCHC 33.3 30.0 - 36.0 g/dL   RDW 14.2 11.5 - 15.5 %   Platelets 116 (L) 150 - 400 K/uL    Comment: REPEATED TO VERIFY PLATELET COUNT CONFIRMED BY SMEAR SPECIMEN CHECKED FOR CLOTS Immature Platelet Fraction may be clinically indicated, consider ordering this additional test [id number]    nRBC 0.0 0.0 - 0.2 %   Neutrophils Relative % 64 %   Neutro Abs 3.1 1.7 - 7.7 K/uL   Lymphocytes Relative 25 %   Lymphs Abs 1.2 0.7 - 4.0 K/uL   Monocytes Relative 9 %   Monocytes Absolute 0.4 0.1 - 1.0 K/uL   Eosinophils Relative 1 %   Eosinophils Absolute 0.0 0.0 - 0.5 K/uL   Basophils Relative 0 %   Basophils Absolute 0.0 0.0 - 0.1 K/uL   Immature Granulocytes 1 %   Abs Immature Granulocytes 0.03  0.00 - 0.07 K/uL    Comment: Performed at [facility], [place] [address], [place], [place] [address]  Urinalysis, Routine w reflex microscopic     Status: None   Collection Time: 11/19/18  9:21 AM  Result Value Ref Range   Color, Urine YELLOW YELLOW   APPearance CLEAR CLEAR   Specific Gravity, Urine 1.015 1.005 - 1.030   pH 5.0 5.0 - 8.0   Glucose, UA NEGATIVE NEGATIVE mg/dL   Hgb urine dipstick NEGATIVE NEGATIVE   Bilirubin Urine NEGATIVE NEGATIVE   Ketones, ur NEGATIVE NEGATIVE mg/dL   Protein, ur NEGATIVE NEGATIVE mg/dL   Nitrite NEGATIVE NEGATIVE   Leukocytes,Ua NEGATIVE NEGATIVE    Comment: Performed at [facility] [address], [place], [place] [address]  SARS Coronavirus 2 (CEPHEID - Performed in [place] [place] hospital lab), Hosp Order     Status: None   Collection Time: 11/19/18  9:21 AM   Specimen: Nasopharyngeal Swab  Result Value Ref Range   SARS Coronavirus 2 NEGATIVE NEGATIVE    Comment: (NOTE) If result is NEGATIVE SARS-CoV-2 target nucleic acids are NOT DETECTED. The SARS-CoV-2 RNA is generally detectable in upper and lower  respiratory specimens during the acute phase of infection. The lowest  concentration of SARS-CoV-2 viral copies this assay can detect is 250  copies / mL. A negative result does not preclude SARS-CoV-2 infection  and should not be used as the sole basis for treatment or other  patient management decisions.  A negative result may occur with  improper specimen collection / handling, submission of specimen other  than nasopharyngeal swab, presence of viral mutation(s) within the  areas targeted by this assay, and inadequate number of viral copies  (<250 copies / mL). A negative result must be combined with clinical  observations, patient history, and epidemiological information. If result is POSITIVE SARS-CoV-2 target nucleic acids are DETECTED. The SARS-CoV-2 RNA is generally detectable in upper and lower   respiratory specimens dur ing the acute phase of infection.  Positive  results are indicative of active infection with SARS-CoV-2.  Clinical  correlation with patient history and other diagnostic information is  necessary to determine patient infection status.  Positive results do  not rule out bacterial infection or co-infection with other viruses.  If result is PRESUMPTIVE POSTIVE SARS-CoV-2 nucleic acids MAY BE PRESENT.   A presumptive positive result was obtained on the submitted specimen  and confirmed on repeat testing.  While 2019 novel coronavirus  (SARS-CoV-2) nucleic acids may be present in the submitted sample  additional confirmatory testing may be necessary for epidemiological  and / or clinical management purposes  to differentiate between  SARS-CoV-2 and other Sarbecovirus currently known to infect humans.  If clinically indicated additional testing with an alternate test  methodology [id number]) is advised. The SARS-CoV-2 RNA is generally  detectable in upper and lower respiratory sp ecimens during the acute  phase of infection. The expected result is Negative. Fact Sheet for Patients:  BoilerBrush.com.cyhttps://www.fda.gov/media/136312/download Fact Sheet for Healthcare Providers: https://pope.com/https://www.fda.gov/media/136313/download This test is not yet approved or cleared by the [place] FDA and has been authorized for detection and/or diagnosis of SARS-CoV-2 by FDA under an Emergency Use Authorization (EUA).  This EUA will remain in effect (meaning this test can be used) for the duration of the COVID-19 declaration under Section 564(b)(1) of the Act, 21 U.S.C. section 360bbb-3(b)(1), unless the authorization is terminated or revoked sooner. Performed at [facility], [address] [place], [place] [address]    Dg Chest Portable 1 View  Result Date: 11/19/2018 CLINICAL DATA:  Shortness of breath and cough. EXAM: PORTABLE CHEST 1 VIEW COMPARISON:  None. FINDINGS:  Heart size and pulmonary vascularity are normal. There is fullness in the region of the main pulmonary artery. Lungs are clear. No effusions. Bones are normal. IMPRESSION: Soft tissue prominence in the region of the main pulmonary artery, nonspecific. This could be the result of pulmonic valve stenosis or pulmonary arterial hypertension. Adenopathy could also cause this appearance. No prior studies available for comparison. Comparison with outside prior studies or CT scan of the chest with contrast may be helpful. Electronically Signed   By: Francene BoyersJames  Maxwell M.D.   On: 11/19/2018 09:36    Pending Labs Unresulted Labs (From admission, onward)    Start     Ordered   11/19/18 0850  Urine rapid drug screen (hosp performed)  ONCE - STAT,   STAT     11/19/18 0852   Signed and Held  CBC  (enoxaparin (LOVENOX)    CrCl >/= 30 ml/min)  Once,   R    Comments: Baseline for enoxaparin therapy IF NOT ALREADY DRAWN.  Notify MD if PLT < 100 K.    Signed and Held   Signed and Held  Creatinine, serum  (enoxaparin (LOVENOX)    CrCl >/= 30 ml/min)  Once,   R    Comments: Baseline for enoxaparin therapy IF NOT ALREADY DRAWN.    Signed and Held   Signed and Held  Creatinine, serum  (enoxaparin (LOVENOX)    CrCl >/= 30 ml/min)  Weekly,   R    Comments: while on enoxaparin therapy    Signed and Held   Signed and Held  Magnesium  Once,   R     Signed and Held   Signed and Held  Comprehensive metabolic panel  Tomorrow morning,   R     Signed and Held   Medical illustratorigned and Held  CBC  Tomorrow morning,   R     Signed and Held   Signed and Held  Protime-INR  Tomorrow morning,   R     Signed and Held          Vitals/Pain Today's Vitals   11/19/18 [phone]  11/19/18 0825 11/19/18 0828 11/19/18 0928  BP: (!) 124/98  (!) 124/98 (!) 107/92  Pulse: (!) 110  (!) 110 72  Resp: 20   15  Temp: 98.5 F (36.9 C)     TempSrc: Oral     SpO2: 98%   98%  Weight:  78 kg    Height:  5\' 10"  (1.778 m)    PainSc: 4        Isolation  Precautions No active isolations  Medications Medications  sodium chloride 0.9 % bolus 1,000 mL (0 mLs Intravenous Stopped 11/19/18 1128)  thiamine (B-1) injection 100 mg (100 mg Intravenous Given 11/19/18 0917)  LORazepam (ATIVAN) injection 1 mg (1 mg Intravenous Given 11/19/18 1126)    Mobility walks

## 2018-11-19 NOTE — ED Provider Notes (Signed)
Emergency Department Provider Note   I have reviewed the triage vital signs and the nursing notes.   HISTORY  Chief Complaint Alcohol Intoxication and Cough   HPI Jermaine [name] is a 53 y.o. male with PMH of alcohol abuse and anxiety presents to the emergency department after leaving yesterday during admission for complicated alcohol withdrawal.  Patient states that he became very anxious and claustrophobic in the ICU.  He felt like he just had to get out of there and so left AGAINST MEDICAL ADVICE.  He did drink a fifth or liquor last night but this morning states that he is feeling badly again.  He is feeling somewhat tremulous with subjective fever and cough.  He states he felt like that with his prior EtOH withdrawal episodes. He did not refill the librium prescribed at discharge. No other radiation of symptoms or modifying factors.    Past Medical History:  Diagnosis Date  . Alcohol abuse   . Anxiety   . Depression     Patient Active Problem List   Diagnosis Date Noted  . Alcohol withdrawal ([place]) 11/19/2018  . Alcoholic cirrhosis of liver without ascites ([place]) 11/19/2018  . Alcohol abuse with intoxication ([place]) 11/16/2018  . Chronic alcohol abuse 11/16/2018  . Elevated liver enzymes 11/16/2018  . Thrombocytopenia ([place]) 11/16/2018  . Acute alcohol intoxication with alcoholism ([place]) 11/16/2018    Past Surgical History:  Procedure Laterality Date  . HERNIA REPAIR      Allergies Patient has no known allergies.  No family history on file.  Social History Social History   Tobacco Use  . Smoking status: Never Smoker  . Smokeless tobacco: Never Used  Substance Use Topics  . Alcohol use: Yes  . Drug use: Not Currently    Review of Systems  Constitutional: Subjective fever.  Eyes: No visual changes. ENT: No sore throat. Cardiovascular: Denies chest pain. Respiratory: Denies shortness of breath. Positive cough.  Gastrointestinal: No abdominal pain.  No  nausea, no vomiting.  No diarrhea.  No constipation. Genitourinary: Negative for dysuria. Musculoskeletal: Negative for back pain. Skin: Negative for rash. Neurological: Negative for headaches, focal weakness or numbness.  10-point ROS otherwise negative.  ____________________________________________   PHYSICAL EXAM:  VITAL SIGNS: ED Triage Vitals  Enc Vitals Group     BP 11/19/18 0824 (!) 124/98     Pulse Rate 11/19/18 0824 (!) 110     Resp 11/19/18 0824 20     Temp 11/19/18 0824 98.5 F (36.9 C)     Temp Source 11/19/18 0824 Oral     SpO2 11/19/18 0824 98 %     Weight 11/19/18 0825 172 lb (78 kg)     Height 11/19/18 0825 5\' 10"  (1.778 m)   Constitutional: Alert and oriented. Well appearing and in no acute distress. Slight tremor noted.  Eyes: Conjunctivae are normal.  Head: Atraumatic. Nose: No congestion/rhinnorhea. Mouth/Throat: Mucous membranes are moist.  Neck: No stridor.  Cardiovascular: Tachycardia. Good peripheral circulation. Grossly normal heart sounds.   Respiratory: Normal respiratory effort.  No retractions. Lungs CTAB. Gastrointestinal: Soft and nontender. No distention.  Musculoskeletal: No lower extremity tenderness nor edema. No gross deformities of extremities. Neurologic:  Normal speech and language. No gross focal neurologic deficits are appreciated.  Skin:  Skin is warm, dry and intact. No rash noted.  ____________________________________________   LABS (all labs ordered are listed, but only abnormal results are displayed)  Labs Reviewed  COMPREHENSIVE METABOLIC PANEL - Abnormal; Notable for the following components:  Result Value   AST 107 (*)    ALT 113 (*)    All other components within normal limits  CBC WITH DIFFERENTIAL/PLATELET - Abnormal; Notable for the following components:   Platelets 116 (*)    All other components within normal limits  CBC - Abnormal; Notable for the following components:   Platelets 113 (*)    All other  components within normal limits  SARS CORONAVIRUS 2 (HOSPITAL ORDER, PERFORMED IN [facility])  ETHANOL  LIPASE, BLOOD  URINALYSIS, ROUTINE W REFLEX MICROSCOPIC  CREATININE, SERUM  MAGNESIUM  RAPID URINE DRUG SCREEN, HOSP PERFORMED  COMPREHENSIVE METABOLIC PANEL  CBC  PROTIME-INR   ____________________________________________  EKG   EKG Interpretation  Date/Time:  Wednesday November 19 2018 09:25:46 EDT Ventricular Rate:  69 PR Interval:    QRS Duration: 99 QT Interval:  412 QTC Calculation: 442 R Axis:   92 Text Interpretation:  Sinus rhythm Borderline right axis deviation No STEMI  Confirmed by Alona BeneLong, Joshua [phone]) on 11/19/2018 10:15:32 AM       ____________________________________________  RADIOLOGY  Dg Chest Portable 1 View  Result Date: 11/19/2018 CLINICAL DATA:  Shortness of breath and cough. EXAM: PORTABLE CHEST 1 VIEW COMPARISON:  None. FINDINGS: Heart size and pulmonary vascularity are normal. There is fullness in the region of the main pulmonary artery. Lungs are clear. No effusions. Bones are normal. IMPRESSION: Soft tissue prominence in the region of the main pulmonary artery, nonspecific. This could be the result of pulmonic valve stenosis or pulmonary arterial hypertension. Adenopathy could also cause this appearance. No prior studies available for comparison. Comparison with outside prior studies or CT scan of the chest with contrast may be helpful. Electronically Signed   By: Francene BoyersJames  Maxwell M.D.   On: 11/19/2018 09:36    ____________________________________________   PROCEDURES  Procedure(s) performed:   Procedures  CRITICAL CARE Performed by: Maia PlanJoshua G Long Total critical care time: 35 minutes Critical care time was exclusive of separately billable procedures and treating other patients. Critical care was necessary to treat or prevent imminent or life-threatening deterioration. Critical care was time spent personally by me on the following  activities: development of treatment plan with patient and/or surrogate as well as nursing, discussions with consultants, evaluation of patient's response to treatment, examination of patient, obtaining history from patient or surrogate, ordering and performing treatments and interventions, ordering and review of laboratory studies, ordering and review of radiographic studies, pulse oximetry and re-evaluation of patient's condition.  Alona BeneJoshua Long, MD Emergency Medicine  ____________________________________________   INITIAL IMPRESSION / ASSESSMENT AND PLAN / ED COURSE  Pertinent labs & imaging results that were available during my care of the patient were reviewed by me and considered in my medical decision making (see chart for details).   Patient returns to the emergency department after leaving AGAINST MEDICAL ADVICE while being treated for complicated alcohol withdrawal.  He did drink alcohol last night.  Reporting subjective fever along with cough.  Recent admission to the ICU.  Will obtain screening COVID testing along with additional lab work, IV fluids, thiamine.   Labs reviewed. CIWA 14. Patient is interested in EtOH treatment. Agreeable to stay inpatient for detox.   Discussed patient's case with Hospitalist to request admission. Patient and family (if present) updated with plan. Care transferred to Hospitalist service.  I reviewed all nursing notes, vitals, pertinent old records, EKGs, labs, imaging (as available).  ____________________________________________  FINAL CLINICAL IMPRESSION(S) / ED DIAGNOSES  Final diagnoses:  Alcohol  withdrawal syndrome with complication (HCC)     MEDICATIONS GIVEN DURING THIS VISIT:  Medications  escitalopram (LEXAPRO) tablet 20 mg (20 mg Oral Given 11/19/18 1504)  enoxaparin (LOVENOX) injection 40 mg (40 mg Subcutaneous Given 11/19/18 1937)  sodium chloride flush (NS) 0.9 % injection 3 mL (3 mLs Intravenous Not Given 11/19/18 1505)  0.9 %  NaCl with KCl 20 mEq/ L  infusion ( Intravenous New Bag/Given 11/19/18 1503)  acetaminophen (TYLENOL) tablet 650 mg (has no administration in time range)    Or  acetaminophen (TYLENOL) suppository 650 mg (has no administration in time range)  ondansetron (ZOFRAN) tablet 4 mg (4 mg Oral Given 11/19/18 1937)    Or  ondansetron (ZOFRAN) injection 4 mg ( Intravenous See Alternative 11/19/18 1937)  thiamine (VITAMIN B-1) tablet 100 mg (100 mg Oral Given 11/19/18 1504)    Or  thiamine (B-1) injection 100 mg ( Intravenous See Alternative 11/19/18 1504)  folic acid (FOLVITE) tablet 1 mg (1 mg Oral Given 11/19/18 1504)  multivitamin with minerals tablet 1 tablet (1 tablet Oral Given 11/19/18 1504)  chlordiazePOXIDE (LIBRIUM) capsule 25 mg (25 mg Oral Given 11/19/18 1504)  LORazepam (ATIVAN) tablet 1 mg (1 mg Oral Given 11/19/18 1937)    Or  LORazepam (ATIVAN) injection 1 mg ( Intravenous See Alternative 11/19/18 1937)  MEDLINE mouth rinse (has no administration in time range)  Chlorhexidine Gluconate Cloth 2 % PADS 6 each (has no administration in time range)  sodium chloride 0.9 % bolus 1,000 mL (0 mLs Intravenous Stopped 11/19/18 1128)  thiamine (B-1) injection 100 mg (100 mg Intravenous Given 11/19/18 0917)  LORazepam (ATIVAN) injection 1 mg (1 mg Intravenous Given 11/19/18 1126)     Note:  This document was prepared using Dragon voice recognition software and may include unintentional dictation errors.  Alona BeneJoshua Long, MD Emergency Medicine    Long, Arlyss RepressJoshua G, MD 11/19/18 [phone]

## 2018-11-19 NOTE — ED Notes (Signed)
X ray in room.

## 2018-11-19 NOTE — H&P (Signed)
History and Physical    Jermaine MoanMichael Lenderman ZOX:[id number] DOB: 1965-12-20 DOA: 11/19/2018  PCP: [name], [name], MD  Patient coming from: Home  I have personally briefly reviewed patient's [facility]  Chief Complaint: Headache/tremors  HPI: Jermaine [name] is a 53 y.o. male with medical history significant of chronic alcohol abuse with history of alcohol withdrawal, anxiety and depression presented to emergency department with a complaint of headache and tremors and the intention to seek help to go through alcohol withdrawal so he can quit.  Apparently patient was [facility] at [facility]2018 for alcohol intoxication and withdrawal and left AGAINST MEDICAL ADVICE yesterday.  According to patient, he had significant tremors and was unable to walk properly even when he left the hospital yesterday.  In order to overcome that, he drank 1/5 of vodka last night and felt better only to start having tremors once again this morning.  Again he realizes that he needs to quit so he came to the emergency department to seek help.  He has anxiety and headache currently.  No other complaint.  Cording to patient, he has been admitted in the ICU before with severe withdrawal however has never had any alcohol withdrawal seizures.  He tells me that he was at the verge of intubation at one point in time.  He further tells me that there was a time that he was sober for 2 years and then he started drinking for quite some time and then he was sober for another 9 months and started drinking again about 3 to 4 weeks ago and has been drinking fifth of vodka every day ever since.  ED Course: Upon arrival to the emergency department, he was hemodynamically stable however had significant tremors with CIWA score of 14 and he was given a dose of Ativan.  He has chronic thrombocytopenia and alcoholic liver disease and both are stable with no signs of bleeding.  Hospital service was requested to admit  the patient for alcohol withdrawal and further management.  Review of Systems: As per HPI otherwise 10 point review of systems negative.    Past Medical History:  Diagnosis Date  . Alcohol abuse   . Anxiety   . Depression     Past Surgical History:  Procedure Laterality Date  . HERNIA REPAIR       reports that he has never smoked. He has never used smokeless tobacco. He reports current alcohol use. He reports previous drug use.  No Known Allergies  No family history on file.  Prior to Admission medications   Medication Sig Start Date End Date Taking? Authorizing Provider  chlordiazePOXIDE (LIBRIUM) 25 MG capsule Take 1 capsule (25 mg total) by mouth 3 (three) times daily for 1 day, THEN 1 capsule (25 mg total) 2 (two) times a day for 1 day, THEN 1 capsule (25 mg total) daily for 1 day. 11/18/18 11/21/18 Yes Zigmund DanielPowell, A Caldwell Jr., MD  escitalopram (LEXAPRO) 20 MG tablet Take 20 mg by mouth daily. 08/13/18  Yes [provider]    Physical Exam: Vitals:   11/19/18 0824 11/19/18 0825 11/19/18 0828 11/19/18 0928  BP: (!) 124/98  (!) 124/98 (!) 107/92  Pulse: (!) 110  (!) 110 72  Resp: 20   15  Temp: 98.5 F (36.9 C)     TempSrc: Oral     SpO2: 98%   98%  Weight:  78 kg    Height:  5\' 10"  (1.778 m)  Constitutional: NAD, calm, comfortable Vitals:   11/19/18 0824 11/19/18 0825 11/19/18 0828 11/19/18 0928  BP: (!) 124/98  (!) 124/98 (!) 107/92  Pulse: (!) 110  (!) 110 72  Resp: 20   15  Temp: 98.5 F (36.9 C)     TempSrc: Oral     SpO2: 98%   98%  Weight:  78 kg    Height:  5\' 10"  (1.778 m)     Eyes: PERRL, lids and conjunctivae normal ENMT: Mucous membranes are dry. Posterior pharynx clear of any exudate or lesions.Normal dentition.  Neck: normal, supple, no masses, no thyromegaly Respiratory: clear to auscultation bilaterally, no wheezing, no crackles. Normal respiratory effort. No accessory muscle use.  Cardiovascular: Regular rate and rhythm, no  murmurs / rubs / gallops. No extremity edema. 2+ pedal pulses. No carotid bruits.  Abdomen: no tenderness, no masses palpated. No hepatosplenomegaly. Bowel sounds positive.  Musculoskeletal: no clubbing / cyanosis. No joint deformity upper and lower extremities. Good ROM, no contractures. Normal muscle tone.  He has gross tremors bilateral upper extremities Skin: no rashes, lesions, ulcers. No induration Neurologic: CN 2-12 grossly intact. Sensation intact, DTR normal. Strength 5/5 in all 4.  Psychiatric: Normal judgment and poor. Alert and oriented x 3.  Depressed mood and flat affect   Labs on Admission: I have personally reviewed following labs and imaging studies  CBC: Recent Labs  Lab 11/16/18 0839 11/17/18 0934 11/18/18 0216 11/19/18 0921  WBC 4.9 4.4 4.7 4.8  NEUTROABS  --   --   --  3.1  HGB 14.8 14.3 13.6 14.5  HCT 43.3 44.8 41.7 43.6  MCV 95.6 99.1 98.6 98.0  PLT 129* 120* 111* 841*   Basic Metabolic Panel: Recent Labs  Lab 11/16/18 0839 11/17/18 0934 11/18/18 0216 11/19/18 0921  NA 140 136 137 138  K 3.9 3.7 3.7 3.6  CL 98 98 100 103  CO2 29 28 27 25   GLUCOSE 105* 184* 107* 87  BUN 19 14 15 15   CREATININE 0.79 0.73 0.75 0.79  CALCIUM 9.0 9.2 9.1 9.2  MG  --  2.2 2.4  --    GFR: Estimated Creatinine Clearance: 110.3 mL/min (by C-G formula based on SCr of 0.79 mg/dL). Liver Function Tests: Recent Labs  Lab 11/16/18 0839 11/17/18 0934 11/18/18 0216 11/19/18 0921  AST 79* 58* 53* 107*  ALT 65* 56* 54* 113*  ALKPHOS 63 44 46 45  BILITOT 0.3 0.7 0.7 1.0  PROT 7.3 6.8 7.0 7.4  ALBUMIN 4.7 4.2 4.3 4.6   Recent Labs  Lab 11/19/18 0921  LIPASE 32   No results for input(s): AMMONIA in the last 168 hours. Coagulation Profile: No results for input(s): INR, PROTIME in the last 168 hours. Cardiac Enzymes: No results for input(s): CKTOTAL, CKMB, CKMBINDEX, TROPONINI in the last 168 hours. BNP (last 3 results) No results for input(s): PROBNP in the last  8760 hours. HbA1C: No results for input(s): HGBA1C in the last 72 hours. CBG: Recent Labs  Lab 11/16/18 1425  GLUCAP 80   Lipid Profile: No results for input(s): CHOL, HDL, LDLCALC, TRIG, CHOLHDL, LDLDIRECT in the last 72 hours. Thyroid Function Tests: No results for input(s): TSH, T4TOTAL, FREET4, T3FREE, THYROIDAB in the last 72 hours. Anemia Panel: No results for input(s): VITAMINB12, FOLATE, FERRITIN, TIBC, IRON, RETICCTPCT in the last 72 hours. Urine analysis: No results found for: COLORURINE, APPEARANCEUR, LABSPEC, PHURINE, GLUCOSEU, HGBUR, BILIRUBINUR, KETONESUR, PROTEINUR, UROBILINOGEN, NITRITE, LEUKOCYTESUR  Radiological Exams on Admission: Dg Chest Portable  1 View  Result Date: 11/19/2018 CLINICAL DATA:  Shortness of breath and cough. EXAM: PORTABLE CHEST 1 VIEW COMPARISON:  None. FINDINGS: Heart size and pulmonary vascularity are normal. There is fullness in the region of the main pulmonary artery. Lungs are clear. No effusions. Bones are normal. IMPRESSION: Soft tissue prominence in the region of the main pulmonary artery, nonspecific. This could be the result of pulmonic valve stenosis or pulmonary arterial hypertension. Adenopathy could also cause this appearance. No prior studies available for comparison. Comparison with outside prior studies or CT scan of the chest with contrast may be helpful. Electronically Signed   By: Francene BoyersJames  Maxwell M.D.   On: 11/19/2018 09:36    EKG: Independently reviewed.   Assessment/Plan Active Problems:   Chronic alcohol abuse   Thrombocytopenia (HCC)   Alcohol withdrawal (HCC)   Alcoholic cirrhosis of liver without ascites (HCC)   Alcohol withdrawal: He has already started having significant alcohol withdrawal with CIWA 14 in the emergency department required a demon.  Due to his previous history and my suspicion that he may have severe withdrawal, we are admitting him under observation in stepdown unit in case he needs Precedex drip.  In  the meantime, I will start him on Librium 25 mg 3 times daily for 6 doses along with CIWA protocol and PRN Ativan as well as multivitamin thiamine and folic acid.  I spent a great length of time counseling him to quit alcohol.  He verbalizes understanding and promises that this time he would comply with medical treatment and recommendations and would not leave AGAINST MEDICAL ADVICE.  Chronic thrombocytopenia: Able.  No signs of bleeding.  Daily CBC.  Alcoholic liver disease: Stable  DVT prophylaxis: Lovenox Code Status: Full code Family Communication: None present the bedside.  Patient request that his presence in the hospital and has alcoholic issues should not be discussed with anybody and in fact nobody should be allowed to see him. Disposition Plan: Likely back to home in next couple of days. Consults called: None Admission status: Observation   Hughie Clossavi  MD [facility] Pager [phone]  If 7PM-7AM, please contact night-coverage [email or web address] Password [email or web address]  11/19/2018, 11:49 AM

## 2018-11-20 DIAGNOSIS — F329 Major depressive disorder, single episode, unspecified: Secondary | ICD-10-CM

## 2018-11-20 DIAGNOSIS — F32A Depression, unspecified: Secondary | ICD-10-CM | POA: Diagnosis present

## 2018-11-20 DIAGNOSIS — R748 Abnormal levels of other serum enzymes: Secondary | ICD-10-CM

## 2018-11-20 DIAGNOSIS — F419 Anxiety disorder, unspecified: Secondary | ICD-10-CM

## 2018-11-20 LAB — COMPREHENSIVE METABOLIC PANEL
ALT: 131 U/L — ABNORMAL HIGH (ref 0–44)
AST: 113 U/L — ABNORMAL HIGH (ref 15–41)
Albumin: 3.8 g/dL (ref 3.5–5.0)
Alkaline Phosphatase: 39 U/L (ref 38–126)
Anion gap: 8 (ref 5–15)
BUN: 20 mg/dL (ref 6–20)
CO2: 28 mmol/L (ref 22–32)
Calcium: 8.7 mg/dL — ABNORMAL LOW (ref 8.9–10.3)
Chloride: 102 mmol/L (ref 98–111)
Creatinine, Ser: 0.86 mg/dL (ref 0.61–1.24)
GFR calc Af Amer: >60 mL/min (ref >60)
GFR calc non Af Amer: >60 mL/min (ref >60)
Glucose, Bld: 93 mg/dL (ref 70–99)
Potassium: 4.3 mmol/L (ref 3.5–5.1)
Sodium: 138 mmol/L (ref 135–145)
Total Bilirubin: 0.6 mg/dL (ref 0.3–1.2)
Total Protein: 6.1 g/dL — ABNORMAL LOW (ref 6.5–8.1)

## 2018-11-20 LAB — PROTIME-INR
INR: 1 (ref 0.8–1.2)
Prothrombin Time: 13.4 seconds (ref 11.4–15.2)

## 2018-11-20 LAB — CBC
HCT: 41.4 % (ref 39.0–52.0)
Hemoglobin: 13.1 g/dL (ref 13.0–17.0)
MCH: 31.9 pg (ref 26.0–34.0)
MCHC: 31.6 g/dL (ref 30.0–36.0)
MCV: 100.7 fL — ABNORMAL HIGH (ref 80.0–100.0)
Platelets: 114 10*3/uL — ABNORMAL LOW (ref 150–400)
RBC: 4.11 MIL/uL — ABNORMAL LOW (ref 4.22–5.81)
RDW: 14.3 % (ref 11.5–15.5)
WBC: 5.7 10*3/uL (ref 4.0–10.5)
nRBC: 0 % (ref 0.0–0.2)

## 2018-11-20 LAB — VITAMIN B1: Vitamin B1 (Thiamine): 116.3 nmol/L (ref 66.5–200.0)

## 2018-11-20 MED ORDER — CHLORDIAZEPOXIDE HCL 25 MG PO CAPS
25.0000 mg | ORAL_CAPSULE | Freq: Four times a day (QID) | ORAL | Status: AC | PRN
  Administered 2018-11-21 – 2018-11-22 (×2): 25 mg via ORAL
  Filled 2018-11-20 (×2): qty 1

## 2018-11-20 MED ORDER — CHLORDIAZEPOXIDE HCL 25 MG PO CAPS
25.0000 mg | ORAL_CAPSULE | ORAL | Status: AC
  Administered 2018-11-22 (×2): 25 mg via ORAL
  Filled 2018-11-20 (×2): qty 1

## 2018-11-20 MED ORDER — CHLORDIAZEPOXIDE HCL 25 MG PO CAPS
25.0000 mg | ORAL_CAPSULE | Freq: Every day | ORAL | Status: AC
  Administered 2018-11-23: 25 mg via ORAL
  Filled 2018-11-20: qty 1

## 2018-11-20 MED ORDER — SODIUM CHLORIDE 0.9 % IV SOLN
INTRAVENOUS | Status: DC
  Administered 2018-11-20 – 2018-11-21 (×4): via INTRAVENOUS

## 2018-11-20 MED ORDER — CHLORDIAZEPOXIDE HCL 25 MG PO CAPS
25.0000 mg | ORAL_CAPSULE | Freq: Four times a day (QID) | ORAL | Status: AC
  Administered 2018-11-20 (×4): 25 mg via ORAL
  Filled 2018-11-20 (×4): qty 1

## 2018-11-20 MED ORDER — LOPERAMIDE HCL 2 MG PO CAPS: 2.0000 mg | ORAL_CAPSULE | ORAL | Status: AC | PRN

## 2018-11-20 MED ORDER — CHLORDIAZEPOXIDE HCL 25 MG PO CAPS
25.0000 mg | ORAL_CAPSULE | Freq: Three times a day (TID) | ORAL | Status: AC
  Administered 2018-11-21 (×3): 25 mg via ORAL
  Filled 2018-11-20 (×3): qty 1

## 2018-11-20 MED ORDER — CALCIUM GLUCONATE-NACL 1-0.675 GM/50ML-% IV SOLN
1.0000 g | Freq: Once | INTRAVENOUS | Status: AC
  Administered 2018-11-20: 1000 mg via INTRAVENOUS
  Filled 2018-11-20: qty 50

## 2018-11-20 MED ORDER — ONDANSETRON 4 MG PO TBDP: 4.0000 mg | ORAL_TABLET | Freq: Four times a day (QID) | ORAL | Status: DC | PRN

## 2018-11-20 MED ORDER — HYDROXYZINE HCL 25 MG PO TABS
25.0000 mg | ORAL_TABLET | Freq: Four times a day (QID) | ORAL | Status: AC | PRN
  Administered 2018-11-20 – 2018-11-22 (×5): 25 mg via ORAL
  Filled 2018-11-20 (×5): qty 1

## 2018-11-20 NOTE — Progress Notes (Signed)
PROGRESS NOTE    Jermaine [name]  YQM:[id number] DOB: [birth year] DOA: 11/19/2018 PCP: [name], [name], MD    Brief Narrative:  HPI per Dr. [name] Rayn [name] is a 53 y.o. male with medical history significant of chronic alcohol abuse with history of alcohol withdrawal, anxiety and depression presented to emergency department with a complaint of headache and tremors and the intention to seek help to go through alcohol withdrawal so he can quit.  Apparently patient was [facility] for alcohol intoxication and withdrawal and left AGAINST MEDICAL ADVICE yesterday.  According to patient, he had significant tremors and was unable to walk properly even when he left the hospital yesterday.  In order to overcome that, he drank 1/5 of vodka last night and felt better only to start having tremors once again this morning.  Again he realizes that he needs to quit so he came to the emergency department to seek help.  He has anxiety and headache currently.  No other complaint.  Cording to patient, he has been admitted in the ICU before with severe withdrawal however has never had any alcohol withdrawal seizures.  He tells me that he was at the verge of intubation at one point in time.  He further tells me that there was a time that he was sober for 2 years and then he started drinking for quite some time and then he was sober for another 9 months and started drinking again about 3 to 4 weeks ago and has been drinking fifth of vodka every day ever since.  ED Course: Upon arrival to the emergency department, he was hemodynamically stable however had significant tremors with CIWA score of 14 and he was given a dose of Ativan.  He has chronic thrombocytopenia and alcoholic liver disease and both are stable with no signs of bleeding.  Hospital service was requested to admit the patient for alcohol withdrawal and further management.  Assessment & Plan:   Principal Problem:   Alcohol  withdrawal (HCC) Active Problems:   Chronic alcohol abuse   Elevated liver enzymes   Thrombocytopenia (HCC)   Alcoholic cirrhosis of liver without ascites (HCC)   Anxiety and depression   1 alcohol withdrawal/chronic alcohol abuse On admission patient noted to have started significant alcohol withdrawal with a CIWA of 14 in the ED.  Patient lying in bed.  Patient with tremors noted.  Patient also with some anxiety per RN.  Discontinue Librium 25 mg 3 times daily x6 doses and placed on the Librium detox protocol.  Continue the Ativan [place] protocol.  Continue multivitamin, thiamine, folic acid.  Continue IV fluids.  Supportive care.  Seeking help for alcohol cessation and for alcohol withdrawal.  Social work consult.  Follow.  2.  Chronic thrombocytopenia Stable.  No signs of bleeding.  3.  Alcoholic liver disease Stable.  Outpatient follow-up.  4.  Transaminitis Likely secondary to problem #3.  Patient with no abdominal pain and no ascites noted.  Check an acute hepatitis panel.  Outpatient follow-up.  5.  Depression and anxiety Continue home regimen of Lexapro.  Patient on Ativan withdrawal protocol and a Librium detox protocol.   DVT prophylaxis: Lovenox Code Status: Full Family Communication: Updated patient.  No family at bedside. Disposition Plan: Transfer to telemetry.   Consultants:   None  Procedures:   Chest x-ray 11/19/2018  Antimicrobials:   None   Subjective: Patient laying in bed.  Feels some improvement with his tremors however still with  some tremors.  Denies any chest pain or shortness of breath.  No abdominal pain.  Asking when he will be able to be discharged home.  Tolerating oral intake.  Denies any nausea or emesis.  Objective: Vitals:   11/20/18 0326 11/20/18 0400 11/20/18 0500 11/20/18 0645  BP:  113/81 123/79   Pulse:  (!) 54 (!) 54   Resp:  14 15   Temp:  (!) 97.5 F (36.4 C)  97.7 F (36.5 C)  TempSrc:  Oral  Oral  SpO2:  98% 98%    Weight: 80.3 kg     Height:        Intake/Output Summary (Last 24 hours) at 11/20/2018 0952 Last data filed at 11/20/2018 0324 Gross per 24 hour  Intake 2469.63 ml  Output 400 ml  Net 2069.63 ml   Filed Weights   11/19/18 0825 11/20/18 0326  Weight: 78 kg 80.3 kg    Examination:  General exam: Tremors noted. Respiratory system: Clear to auscultation. Respiratory effort normal. Cardiovascular system: S1 & S2 heard, RRR. No JVD, murmurs, rubs, gallops or clicks. No pedal edema. Gastrointestinal system: Abdomen is nondistended, soft and nontender. No organomegaly or masses felt. Normal bowel sounds heard. Central nervous system: Alert and oriented. No focal neurological deficits. Extremities: Symmetric 5 x 5 power. Skin: No rashes, lesions or ulcers Psychiatry: Judgement and insight appear normal. Mood & affect appropriate.     Data Reviewed: I have personally reviewed following labs and imaging studies  CBC: Recent Labs  Lab 11/17/18 0934 11/18/18 0216 11/19/18 0921 11/19/18 1359 11/20/18 0155  WBC 4.4 4.7 4.8 5.1 5.7  NEUTROABS  --   --  3.1  --   --   HGB 14.3 13.6 14.5 14.1 13.1  HCT 44.8 41.7 43.6 42.0 41.4  MCV 99.1 98.6 98.0 99.3 100.7*  PLT 120* 111* 116* 113* 114*   Basic Metabolic Panel: Recent Labs  Lab 11/16/18 0839 11/17/18 0934 11/18/18 0216 11/19/18 0921 11/19/18 1359 11/20/18 0155  NA 140 136 137 138  --  138  K 3.9 3.7 3.7 3.6  --  4.3  CL 98 98 100 103  --  102  CO2 29 28 27 25   --  28  GLUCOSE 105* 184* 107* 87  --  93  BUN 19 14 15 15   --  20  CREATININE 0.79 0.73 0.75 0.79 0.75 0.86  CALCIUM 9.0 9.2 9.1 9.2  --  8.7*  MG  --  2.2 2.4  --  2.2  --    GFR: Estimated Creatinine Clearance: 102.6 mL/min (by C-G formula based on SCr of 0.86 mg/dL). Liver Function Tests: Recent Labs  Lab 11/16/18 0839 11/17/18 0934 11/18/18 0216 11/19/18 0921 11/20/18 0155  AST 79* 58* 53* 107* 113*  ALT 65* 56* 54* 113* 131*  ALKPHOS 63 44 46 45  39  BILITOT 0.3 0.7 0.7 1.0 0.6  PROT 7.3 6.8 7.0 7.4 6.1*  ALBUMIN 4.7 4.2 4.3 4.6 3.8   Recent Labs  Lab 11/19/18 0921  LIPASE 32   No results for input(s): AMMONIA in the last 168 hours. Coagulation Profile: Recent Labs  Lab 11/20/18 0155  INR 1.0   Cardiac Enzymes: No results for input(s): CKTOTAL, CKMB, CKMBINDEX, TROPONINI in the last 168 hours. BNP (last 3 results) No results for input(s): PROBNP in the last 8760 hours. HbA1C: No results for input(s): HGBA1C in the last 72 hours. CBG: Recent Labs  Lab 11/16/18 1425  GLUCAP 80   Lipid  Profile: No results for input(s): CHOL, HDL, LDLCALC, TRIG, CHOLHDL, LDLDIRECT in the last 72 hours. Thyroid Function Tests: No results for input(s): TSH, T4TOTAL, FREET4, T3FREE, THYROIDAB in the last 72 hours. Anemia Panel: No results for input(s): VITAMINB12, FOLATE, FERRITIN, TIBC, IRON, RETICCTPCT in the last 72 hours. Sepsis Labs: No results for input(s): PROCALCITON, LATICACIDVEN in the last 168 hours.  Recent Results (from the past 240 hour(s))  SARS Coronavirus 2 (CEPHEID- Performed in [facility] hospital lab), Hosp Order     Status: None   Collection Time: 11/16/18  8:39 AM   Specimen: Nasopharyngeal Swab  Result Value Ref Range Status   SARS Coronavirus 2 NEGATIVE NEGATIVE Final    Comment: (NOTE) If result is NEGATIVE SARS-CoV-2 target nucleic acids are NOT DETECTED. The SARS-CoV-2 RNA is generally detectable in upper and lower  respiratory specimens during the acute phase of infection. The lowest  concentration of SARS-CoV-2 viral copies this assay can detect is 250  copies / mL. A negative result does not preclude SARS-CoV-2 infection  and should not be used as the sole basis for treatment or other  patient management decisions.  A negative result may occur with  improper specimen collection / handling, submission of specimen other  than nasopharyngeal swab, presence of viral mutation(s) within the  areas  targeted by this assay, and inadequate number of viral copies  (<250 copies / mL). A negative result must be combined with clinical  observations, patient history, and epidemiological information. If result is POSITIVE SARS-CoV-2 target nucleic acids are DETECTED. The SARS-CoV-2 RNA is generally detectable in upper and lower  respiratory specimens dur ing the acute phase of infection.  Positive  results are indicative of active infection with SARS-CoV-2.  Clinical  correlation with patient history and other diagnostic information is  necessary to determine patient infection status.  Positive results do  not rule out bacterial infection or co-infection with other viruses. If result is PRESUMPTIVE POSTIVE SARS-CoV-2 nucleic acids MAY BE PRESENT.   A presumptive positive result was obtained on the submitted specimen  and confirmed on repeat testing.  While 2019 novel coronavirus  (SARS-CoV-2) nucleic acids may be present in the submitted sample  additional confirmatory testing may be necessary for epidemiological  and / or clinical management purposes  to differentiate between  SARS-CoV-2 and other Sarbecovirus currently known to infect humans.  If clinically indicated additional testing with an alternate test  methodology [phone]) is advised. The SARS-CoV-2 RNA is generally  detectable in upper and lower respiratory sp ecimens during the acute  phase of infection. The expected result is Negative. Fact Sheet for Patients:  StrictlyIdeas.no Fact Sheet for Healthcare Providers: BankingDealers.co.za This test is not yet approved or cleared by the [place] FDA and has been authorized for detection and/or diagnosis of SARS-CoV-2 by FDA under an Emergency Use Authorization (EUA).  This EUA will remain in effect (meaning this test can be used) for the duration of the COVID-19 declaration under Section 564(b)(1) of the Act, 21 U.S.C. section  360bbb-3(b)(1), unless the authorization is terminated or revoked sooner. Performed at [facility], [place] [address], [place], [place] [address]   SARS Coronavirus 2 (CEPHEID - Performed in [facility] hospital lab), Hosp Order     Status: None   Collection Time: 11/19/18  9:21 AM   Specimen: Nasopharyngeal Swab  Result Value Ref Range Status   SARS Coronavirus 2 NEGATIVE NEGATIVE Final    Comment: (NOTE) If result is NEGATIVE SARS-CoV-2  target nucleic acids are NOT DETECTED. The SARS-CoV-2 RNA is generally detectable in upper and lower  respiratory specimens during the acute phase of infection. The lowest  concentration of SARS-CoV-2 viral copies this assay can detect is 250  copies / mL. A negative result does not preclude SARS-CoV-2 infection  and should not be used as the sole basis for treatment or other  patient management decisions.  A negative result may occur with  improper specimen collection / handling, submission of specimen other  than nasopharyngeal swab, presence of viral mutation(s) within the  areas targeted by this assay, and inadequate number of viral copies  (<250 copies / mL). A negative result must be combined with clinical  observations, patient history, and epidemiological information. If result is POSITIVE SARS-CoV-2 target nucleic acids are DETECTED. The SARS-CoV-2 RNA is generally detectable in upper and lower  respiratory specimens dur ing the acute phase of infection.  Positive  results are indicative of active infection with SARS-CoV-2.  Clinical  correlation with patient history and other diagnostic information is  necessary to determine patient infection status.  Positive results do  not rule out bacterial infection or co-infection with other viruses. If result is PRESUMPTIVE POSTIVE SARS-CoV-2 nucleic acids MAY BE PRESENT.   A presumptive positive result was obtained on the submitted specimen  and confirmed on repeat testing.   While 2019 novel coronavirus  (SARS-CoV-2) nucleic acids may be present in the submitted sample  additional confirmatory testing may be necessary for epidemiological  and / or clinical management purposes  to differentiate between  SARS-CoV-2 and other Sarbecovirus currently known to infect humans.  If clinically indicated additional testing with an alternate test  methodology [phone]) is advised. The SARS-CoV-2 RNA is generally  detectable in upper and lower respiratory sp ecimens during the acute  phase of infection. The expected result is Negative. Fact Sheet for Patients:  BoilerBrush.com.cyhttps://www.fda.gov/media/136312/download Fact Sheet for Healthcare Providers: https://pope.com/https://www.fda.gov/media/136313/download This test is not yet approved or cleared by the [place] FDA and has been authorized for detection and/or diagnosis of SARS-CoV-2 by FDA under an Emergency Use Authorization (EUA).  This EUA will remain in effect (meaning this test can be used) for the duration of the COVID-19 declaration under Section 564(b)(1) of the Act, 21 U.S.C. section 360bbb-3(b)(1), unless the authorization is terminated or revoked sooner. Performed at [facility], [address] [place], [place] [address]          Radiology Studies: Dg Chest Portable 1 View  Result Date: 11/19/2018 CLINICAL DATA:  Shortness of breath and cough. EXAM: PORTABLE CHEST 1 VIEW COMPARISON:  None. FINDINGS: Heart size and pulmonary vascularity are normal. There is fullness in the region of the main pulmonary artery. Lungs are clear. No effusions. Bones are normal. IMPRESSION: Soft tissue prominence in the region of the main pulmonary artery, nonspecific. This could be the result of pulmonic valve stenosis or pulmonary arterial hypertension. Adenopathy could also cause this appearance. No prior studies available for comparison. Comparison with outside prior studies or CT scan of the chest with contrast may be  helpful. Electronically Signed   By: Francene BoyersJames  Maxwell M.D.   On: 11/19/2018 09:36        Scheduled Meds: . chlordiazePOXIDE  25 mg Oral QID   Followed by  . [START ON 11/21/2018] chlordiazePOXIDE  25 mg Oral TID   Followed by  . [START ON 11/22/2018] chlordiazePOXIDE  25 mg Oral BH-qamhs   Followed by  . [START ON  11/23/2018] chlordiazePOXIDE  25 mg Oral Daily  . Chlorhexidine Gluconate Cloth  6 each Topical Daily  . enoxaparin (LOVENOX) injection  40 mg Subcutaneous Q24H  . escitalopram  20 mg Oral Daily  . folic acid  1 mg Oral Daily  . mouth rinse  15 mL Mouth Rinse BID  . multivitamin with minerals  1 tablet Oral Daily  . sodium chloride flush  3 mL Intravenous Q12H  . thiamine  100 mg Oral Daily   Or  . thiamine  100 mg Intravenous Daily   Continuous Infusions: . sodium chloride 125 mL/hr at 11/20/18 0903     LOS: 0 days    Time spent: 40 minutes    Ramiro Harvest, MD [facility]  If 7PM-7AM, please contact night-coverage [email or web address] 11/20/2018, 9:52 AM

## 2018-11-21 DIAGNOSIS — F10239 Alcohol dependence with withdrawal, unspecified: Principal | ICD-10-CM

## 2018-11-21 LAB — CBC
HCT: 42 % (ref 39.0–52.0)
Hemoglobin: 13.1 g/dL (ref 13.0–17.0)
MCH: 32 pg (ref 26.0–34.0)
MCHC: 31.2 g/dL (ref 30.0–36.0)
MCV: 102.7 fL — ABNORMAL HIGH (ref 80.0–100.0)
Platelets: 113 10*3/uL — ABNORMAL LOW (ref 150–400)
RBC: 4.09 MIL/uL — ABNORMAL LOW (ref 4.22–5.81)
RDW: 14.3 % (ref 11.5–15.5)
WBC: 5.2 10*3/uL (ref 4.0–10.5)
nRBC: 0 % (ref 0.0–0.2)

## 2018-11-21 LAB — BASIC METABOLIC PANEL
Anion gap: 6 (ref 5–15)
BUN: 19 mg/dL (ref 6–20)
CO2: 28 mmol/L (ref 22–32)
Calcium: 8.3 mg/dL — ABNORMAL LOW (ref 8.9–10.3)
Chloride: 105 mmol/L (ref 98–111)
Creatinine, Ser: 0.85 mg/dL (ref 0.61–1.24)
GFR calc Af Amer: >60 mL/min (ref >60)
GFR calc non Af Amer: >60 mL/min (ref >60)
Glucose, Bld: 98 mg/dL (ref 70–99)
Potassium: 4.3 mmol/L (ref 3.5–5.1)
Sodium: 139 mmol/L (ref 135–145)

## 2018-11-21 LAB — HEPATITIS PANEL, ACUTE
HCV Ab: <0.1 s/co ratio (ref 0.0–0.9)
Hep A IgM: NEGATIVE
Hep B C IgM: NEGATIVE
Hepatitis B Surface Ag: NEGATIVE

## 2018-11-21 NOTE — Progress Notes (Signed)
PROGRESS NOTE    Jermaine [name]  ZOX:[id number] DOB: 09-19-1965 DOA: 11/19/2018 PCP: [name], [name], MD    Brief Narrative:  HPI per Dr. [name] Jermaine [name] is a 53 y.o. male with medical history significant of chronic alcohol abuse with history of alcohol withdrawal, anxiety and depression presented to emergency department with a complaint of headache and tremors and the intention to seek help to go through alcohol withdrawal so he can quit.  Apparently patient was admitted at [facility] on 11/16/2018 for alcohol intoxication and withdrawal and left AGAINST MEDICAL ADVICE yesterday.  According to patient, he had significant tremors and was unable to walk properly even when he left the hospital yesterday.  In order to overcome that, he drank 1/5 of vodka last night and felt better only to start having tremors once again this morning.  Again he realizes that he needs to quit so he came to the emergency department to seek help.  He has anxiety and headache currently.  No other complaint.  Cording to patient, he has been admitted in the ICU before with severe withdrawal however has never had any alcohol withdrawal seizures.  He tells me that he was at the verge of intubation at one point in time.  He further tells me that there was a time that he was sober for 2 years and then he started drinking for quite some time and then he was sober for another 9 months and started drinking again about 3 to 4 weeks ago and has been drinking fifth of vodka every day ever since.  ED Course: Upon arrival to the emergency department, he was hemodynamically stable however had significant tremors with CIWA score of 14 and he was given a dose of Ativan.  He has chronic thrombocytopenia and alcoholic liver disease and both are stable with no signs of bleeding.  Hospital service was requested to admit the patient for alcohol withdrawal and further management.  Assessment & Plan:   Principal Problem:   Alcohol  withdrawal (HCC) Active Problems:   Chronic alcohol abuse   Elevated liver enzymes   Thrombocytopenia (HCC)   Alcoholic cirrhosis of liver without ascites (HCC)   Anxiety and depression   1 alcohol withdrawal/chronic alcohol abuse On admission patient noted to have started significant alcohol withdrawal with a CIWA of 14 in the ED.  Patient lying in bed.  Patient with tremors noted.  Patient however feels he is improving slowly.  Patient with complaints of anxiety.  Continue Librium detox protocol.  Continue Ativan CIWA protocol.  Continue thiamine, folic acid, multivitamin.  Saline lock IV fluids.  Supportive care.  Patient interested in help for alcohol cessation and withdrawal.  Social work has been consulted. Follow.  2.  Chronic thrombocytopenia Stable.  No signs of bleeding.  3.  Alcoholic liver disease Stable.  Outpatient follow-up.  4.  Transaminitis Likely secondary to problem #3.  Patient with no abdominal pain and no ascites noted.  Acute hepatitis panel is negative.  Outpatient follow-up.  5.  Depression and anxiety Continue home regimen of Lexapro.  Patient on Ativan withdrawal protocol and a Librium detox protocol.   DVT prophylaxis: Lovenox Code Status: Full Family Communication: Updated patient.  No family at bedside. Disposition Plan: Home once patient has completed Librium detox protocol with clinical improvement hopefully in the next 48 hours.   Consultants:   None  Procedures:   Chest x-ray 11/19/2018  Antimicrobials:   None   Subjective: Patient sitting up in bed.  Tremors noted.  Patient complained of some anxiety.  Patient states feeling a little bit better than on admission.  No chest pain.  No shortness of breath.  No abdominal pain.  Tolerating oral intake.  Asking whether he can shower.   Objective: Vitals:   11/20/18 1200 11/20/18 1501 11/20/18 2254 11/21/18 0525  BP:  118/82 101/68 105/78  Pulse:  (!) 56 71 61  Resp:  19 16 16   Temp:  97.9 F (36.6 C) 98 F (36.7 C) 98.5 F (36.9 C) 97.8 F (36.6 C)  TempSrc: Oral Oral Oral Oral  SpO2:  99% 98% 100%  Weight:    79.8 kg  Height:        Intake/Output Summary (Last 24 hours) at 11/21/2018 1034 Last data filed at 11/21/2018 6546 Gross per 24 hour  Intake 2044.67 ml  Output -  Net 2044.67 ml   Filed Weights   11/19/18 0825 11/20/18 0326 11/21/18 0525  Weight: 78 kg 80.3 kg 79.8 kg    Examination:  General exam: Tremors Respiratory system: Clear to auscultation bilaterally.  No wheezes, no crackles, no rhonchi.  Normal respiratory effort.   Cardiovascular system: Regular rate rhythm no murmurs rubs or gallops.  No JVD.  No lower extremity edema.  Gastrointestinal system: Abdomen is soft, nontender, nondistended, positive bowel sounds.  No rebound.  No guarding. Central nervous system: Alert and oriented. No focal neurological deficits. Extremities: Symmetric 5 x 5 power. Skin: No rashes, lesions or ulcers Psychiatry: Judgement and insight appear normal. Mood & affect appropriate.     Data Reviewed: I have personally reviewed following labs and imaging studies  CBC: Recent Labs  Lab 11/18/18 0216 11/19/18 0921 11/19/18 1359 11/20/18 0155 11/21/18 0546  WBC 4.7 4.8 5.1 5.7 5.2  NEUTROABS  --  3.1  --   --   --   HGB 13.6 14.5 14.1 13.1 13.1  HCT 41.7 43.6 42.0 41.4 42.0  MCV 98.6 98.0 99.3 100.7* 102.7*  PLT 111* 116* 113* 114* 503*   Basic Metabolic Panel: Recent Labs  Lab 11/17/18 0934 11/18/18 0216 11/19/18 0921 11/19/18 1359 11/20/18 0155 11/21/18 0546  NA 136 137 138  --  138 139  K 3.7 3.7 3.6  --  4.3 4.3  CL 98 100 103  --  102 105  CO2 28 27 25   --  28 28  GLUCOSE 184* 107* 87  --  93 98  BUN 14 15 15   --  20 19  CREATININE 0.73 0.75 0.79 0.75 0.86 0.85  CALCIUM 9.2 9.1 9.2  --  8.7* 8.3*  MG 2.2 2.4  --  2.2  --   --    GFR: Estimated Creatinine Clearance: 103.8 mL/min (by C-G formula based on SCr of 0.85 mg/dL). Liver  Function Tests: Recent Labs  Lab 11/16/18 0839 11/17/18 0934 11/18/18 0216 11/19/18 0921 11/20/18 0155  AST 79* 58* 53* 107* 113*  ALT 65* 56* 54* 113* 131*  ALKPHOS 63 44 46 45 39  BILITOT 0.3 0.7 0.7 1.0 0.6  PROT 7.3 6.8 7.0 7.4 6.1*  ALBUMIN 4.7 4.2 4.3 4.6 3.8   Recent Labs  Lab 11/19/18 0921  LIPASE 32   No results for input(s): AMMONIA in the last 168 hours. Coagulation Profile: Recent Labs  Lab 11/20/18 0155  INR 1.0   Cardiac Enzymes: No results for input(s): CKTOTAL, CKMB, CKMBINDEX, TROPONINI in the last 168 hours. BNP (last 3 results) No results for input(s): PROBNP in the last 8760  hours. HbA1C: No results for input(s): HGBA1C in the last 72 hours. CBG: Recent Labs  Lab 11/16/18 1425  GLUCAP 80   Lipid Profile: No results for input(s): CHOL, HDL, LDLCALC, TRIG, CHOLHDL, LDLDIRECT in the last 72 hours. Thyroid Function Tests: No results for input(s): TSH, T4TOTAL, FREET4, T3FREE, THYROIDAB in the last 72 hours. Anemia Panel: No results for input(s): VITAMINB12, FOLATE, FERRITIN, TIBC, IRON, RETICCTPCT in the last 72 hours. Sepsis Labs: No results for input(s): PROCALCITON, LATICACIDVEN in the last 168 hours.  Recent Results (from the past 240 hour(s))  SARS Coronavirus 2 (CEPHEID- Performed in [facility] hospital lab), Hosp Order     Status: None   Collection Time: 11/16/18  8:39 AM   Specimen: Nasopharyngeal Swab  Result Value Ref Range Status   SARS Coronavirus 2 NEGATIVE NEGATIVE Final    Comment: (NOTE) If result is NEGATIVE SARS-CoV-2 target nucleic acids are NOT DETECTED. The SARS-CoV-2 RNA is generally detectable in upper and lower  respiratory specimens during the acute phase of infection. The lowest  concentration of SARS-CoV-2 viral copies this assay can detect is 250  copies / mL. A negative result does not preclude SARS-CoV-2 infection  and should not be used as the sole basis for treatment or other  patient management decisions.   A negative result may occur with  improper specimen collection / handling, submission of specimen other  than nasopharyngeal swab, presence of viral mutation(s) within the  areas targeted by this assay, and inadequate number of viral copies  (<250 copies / mL). A negative result must be combined with clinical  observations, patient history, and epidemiological information. If result is POSITIVE SARS-CoV-2 target nucleic acids are DETECTED. The SARS-CoV-2 RNA is generally detectable in upper and lower  respiratory specimens dur ing the acute phase of infection.  Positive  results are indicative of active infection with SARS-CoV-2.  Clinical  correlation with patient history and other diagnostic information is  necessary to determine patient infection status.  Positive results do  not rule out bacterial infection or co-infection with other viruses. If result is PRESUMPTIVE POSTIVE SARS-CoV-2 nucleic acids MAY BE PRESENT.   A presumptive positive result was obtained on the submitted specimen  and confirmed on repeat testing.  While 2019 novel coronavirus  (SARS-CoV-2) nucleic acids may be present in the submitted sample  additional confirmatory testing may be necessary for epidemiological  and / or clinical management purposes  to differentiate between  SARS-CoV-2 and other Sarbecovirus currently known to infect humans.  If clinically indicated additional testing with an alternate test  methodology [phone]([id number]) is advised. The SARS-CoV-2 RNA is generally  detectable in upper and lower respiratory sp ecimens during the acute  phase of infection. The expected result is Negative. Fact Sheet for Patients:  BoilerBrush.com.cyhttps://www.fda.gov/media/136312/download Fact Sheet for Healthcare Providers: https://pope.com/https://www.fda.gov/media/136313/download This test is not yet approved or cleared by the [place] FDA and has been authorized for detection and/or diagnosis of SARS-CoV-2 by FDA under an Emergency Use  Authorization (EUA).  This EUA will remain in effect (meaning this test can be used) for the duration of the COVID-19 declaration under Section 564(b)(1) of the Act, 21 U.S.C. section 360bbb-3(b)(1), unless the authorization is terminated or revoked sooner. Performed at [facility], [address] [place], [place] [address]   SARS Coronavirus 2 (CEPHEID - Performed in [facility] hospital lab), Hosp Order     Status: None   Collection Time: 11/19/18  9:21 AM   Specimen: Nasopharyngeal  Swab  Result Value Ref Range Status   SARS Coronavirus 2 NEGATIVE NEGATIVE Final    Comment: (NOTE) If result is NEGATIVE SARS-CoV-2 target nucleic acids are NOT DETECTED. The SARS-CoV-2 RNA is generally detectable in upper and lower  respiratory specimens during the acute phase of infection. The lowest  concentration of SARS-CoV-2 viral copies this assay can detect is 250  copies / mL. A negative result does not preclude SARS-CoV-2 infection  and should not be used as the sole basis for treatment or other  patient management decisions.  A negative result may occur with  improper specimen collection / handling, submission of specimen other  than nasopharyngeal swab, presence of viral mutation(s) within the  areas targeted by this assay, and inadequate number of viral copies  (<250 copies / mL). A negative result must be combined with clinical  observations, patient history, and epidemiological information. If result is POSITIVE SARS-CoV-2 target nucleic acids are DETECTED. The SARS-CoV-2 RNA is generally detectable in upper and lower  respiratory specimens dur ing the acute phase of infection.  Positive  results are indicative of active infection with SARS-CoV-2.  Clinical  correlation with patient history and other diagnostic information is  necessary to determine patient infection status.  Positive results do  not rule out bacterial infection or co-infection with other viruses.  If result is PRESUMPTIVE POSTIVE SARS-CoV-2 nucleic acids MAY BE PRESENT.   A presumptive positive result was obtained on the submitted specimen  and confirmed on repeat testing.  While 2019 novel coronavirus  (SARS-CoV-2) nucleic acids may be present in the submitted sample  additional confirmatory testing may be necessary for epidemiological  and / or clinical management purposes  to differentiate between  SARS-CoV-2 and other Sarbecovirus currently known to infect humans.  If clinically indicated additional testing with an alternate test  methodology [phone]) is advised. The SARS-CoV-2 RNA is generally  detectable in upper and lower respiratory sp ecimens during the acute  phase of infection. The expected result is Negative. Fact Sheet for Patients:  BoilerBrush.com.cy Fact Sheet for Healthcare Providers: https://pope.com/ This test is not yet approved or cleared by the [place] FDA and has been authorized for detection and/or diagnosis of SARS-CoV-2 by FDA under an Emergency Use Authorization (EUA).  This EUA will remain in effect (meaning this test can be used) for the duration of the COVID-19 declaration under Section 564(b)(1) of the Act, 21 U.S.C. section 360bbb-3(b)(1), unless the authorization is terminated or revoked sooner. Performed at [facility], [address], [place], [place] [address]          Radiology Studies: No results found.      Scheduled Meds: . chlordiazePOXIDE  25 mg Oral TID   Followed by  . [START ON 11/22/2018] chlordiazePOXIDE  25 mg Oral BH-qamhs   Followed by  . [START ON 11/23/2018] chlordiazePOXIDE  25 mg Oral Daily  . Chlorhexidine Gluconate Cloth  6 each Topical Daily  . enoxaparin (LOVENOX) injection  40 mg Subcutaneous Q24H  . escitalopram  20 mg Oral Daily  . folic acid  1 mg Oral Daily  . mouth rinse  15 mL Mouth Rinse BID  . multivitamin with minerals   1 tablet Oral Daily  . sodium chloride flush  3 mL Intravenous Q12H  . thiamine  100 mg Oral Daily   Or  . thiamine  100 mg Intravenous Daily   Continuous Infusions: . sodium chloride 125 mL/hr at 11/21/18 [phone]  LOS: 1 day    Time spent: 35 minutes    Ramiro Harvestaniel , MD [facility]  If 7PM-7AM, please contact night-coverage [email or web address] 11/21/2018, 10:34 AM

## 2018-11-22 LAB — COMPREHENSIVE METABOLIC PANEL
ALT: 110 U/L — ABNORMAL HIGH (ref 0–44)
AST: 55 U/L — ABNORMAL HIGH (ref 15–41)
Albumin: 4 g/dL (ref 3.5–5.0)
Alkaline Phosphatase: 77 U/L (ref 38–126)
Anion gap: 12 (ref 5–15)
BUN: 16 mg/dL (ref 6–20)
CO2: 28 mmol/L (ref 22–32)
Calcium: 8.9 mg/dL (ref 8.9–10.3)
Chloride: 100 mmol/L (ref 98–111)
Creatinine, Ser: 0.84 mg/dL (ref 0.61–1.24)
GFR calc Af Amer: >60 mL/min (ref >60)
GFR calc non Af Amer: >60 mL/min (ref >60)
Glucose, Bld: 96 mg/dL (ref 70–99)
Potassium: 4 mmol/L (ref 3.5–5.1)
Sodium: 140 mmol/L (ref 135–145)
Total Bilirubin: 0.2 mg/dL — ABNORMAL LOW (ref 0.3–1.2)
Total Protein: 6.5 g/dL (ref 6.5–8.1)

## 2018-11-22 NOTE — Progress Notes (Signed)
PROGRESS NOTE    Jermaine MoanMichael [name]  WUJ:[id number]:[id number] DOB: 1965/12/10 DOA: 11/19/2018 PCP: [name], [name], MD    Brief Narrative:  HPI per Dr. [name] [name] is a 53 y.o. male with medical history significant of chronic alcohol abuse with history of alcohol withdrawal, anxiety and depression presented to emergency department with a complaint of headache and tremors and the intention to seek help to go through alcohol withdrawal so he can quit.  Apparently patient was [name] for alcohol intoxication and withdrawal and left AGAINST MEDICAL ADVICE yesterday.  According to patient, he had significant tremors and was unable to walk properly even when he left the hospital yesterday.  In order to overcome that, he drank 1/5 of vodka last night and felt better only to start having tremors once again this morning.  Again he realizes that he needs to quit so he came to the emergency department to seek help.  He has anxiety and headache currently.  No other complaint.  Cording to patient, he has been admitted in the ICU before with severe withdrawal however has never had any alcohol withdrawal seizures.  He tells me that he was at the verge of intubation at one point in time.  He further tells me that there was a time that he was sober for 2 years and then he started drinking for quite some time and then he was sober for another 9 months and started drinking again about 3 to 4 weeks ago and has been drinking fifth of vodka every day ever since.  ED Course: Upon arrival to the emergency department, he was hemodynamically stable however had significant tremors with CIWA score of 14 and he was given a dose of Ativan.  He has chronic thrombocytopenia and alcoholic liver disease and both are stable with no signs of bleeding.  Hospital service was requested to admit the patient for alcohol withdrawal and further management.  Assessment & Plan:   Principal Problem:   Alcohol  withdrawal (HCC) Active Problems:   Chronic alcohol abuse   Elevated liver enzymes   Thrombocytopenia (HCC)   Alcoholic cirrhosis of liver without ascites (HCC)   Anxiety and depression   1 alcohol withdrawal/chronic alcohol abuse On admission patient noted to have started significant alcohol withdrawal with a CIWA of 14 in the ED.  Patient lying in bed.  Patient with tremors noted.  Patient however feels he is improving slowly.  Patient with complaints of anxiety.  Continue Librium detox protocol.  Continue Ativan CIWA protocol.  Continue thiamine, folic acid, multivitamin.  Saline lock IV fluids.  Supportive care.  Patient interested in help for alcohol cessation and withdrawal.  Social work has been consulted. Follow.  2.  Chronic thrombocytopenia Stable.  No signs of bleeding.  3.  Alcoholic liver disease Stable.  Outpatient follow-up.  4.  Transaminitis Likely secondary to problem #3.  Patient with no abdominal pain and no ascites noted.  Acute hepatitis panel is negative.  Outpatient follow-up.  5.  Depression and anxiety Stable.  Continue home regimen Lexapro.  Patient on Ativan withdrawal protocol and a Librium detox protocol.   DVT prophylaxis: Lovenox Code Status: Full Family Communication: Updated patient.  No family at bedside. Disposition Plan: Home once patient has completed Librium detox protocol with clinical improvement hopefully tomorrow.   Consultants:   None  Procedures:   Chest x-ray 11/19/2018  Antimicrobials:   None   Subjective: Patient laying in bed.  States feeling better  today.  Tremors improving.  No chest pain.  No shortness of breath.  No abdominal pain.  Tolerating current diet.   Objective: Vitals:   11/21/18 2145 11/22/18 0500 11/22/18 0631 11/22/18 1358  BP: 108/89  100/70 112/79  Pulse: 73  (!) 53 73  Resp: 17  16 18   Temp: 99.3 F (37.4 C)  97.6 F (36.4 C) 98.8 F (37.1 C)  TempSrc: Oral  Oral Oral  SpO2: 97%  99% 97%   Weight:  79.6 kg    Height:        Intake/Output Summary (Last 24 hours) at 11/22/2018 1540 Last data filed at 11/22/2018 1500 Gross per 24 hour  Intake 480 ml  Output -  Net 480 ml   Filed Weights   11/20/18 0326 11/21/18 0525 11/22/18 0500  Weight: 80.3 kg 79.8 kg 79.6 kg    Examination:  General exam: Minimal tremors Respiratory system: CTAB.  No wheezes, no crackles, no rhonchi.  Normal respiratory effort.   Cardiovascular system: RRR no murmurs rubs or gallops.  No JVD.  No lower extremity edema.  Gastrointestinal system: Abdomen is nontender, nondistended, soft, positive bowel sounds.  No rebound.  No guarding.  Central nervous system: Alert and oriented. No focal neurological deficits. Extremities: Symmetric 5 x 5 power. Skin: No rashes, lesions or ulcers Psychiatry: Judgement and insight appear normal. Mood & affect appropriate.     Data Reviewed: I have personally reviewed following labs and imaging studies  CBC: Recent Labs  Lab 11/18/18 0216 11/19/18 0921 11/19/18 1359 11/20/18 0155 11/21/18 0546  WBC 4.7 4.8 5.1 5.7 5.2  NEUTROABS  --  3.1  --   --   --   HGB 13.6 14.5 14.1 13.1 13.1  HCT 41.7 43.6 42.0 41.4 42.0  MCV 98.6 98.0 99.3 100.7* 102.7*  PLT 111* 116* 113* 114* 258*   Basic Metabolic Panel: Recent Labs  Lab 11/17/18 0934 11/18/18 0216 11/19/18 0921 11/19/18 1359 11/20/18 0155 11/21/18 0546 11/22/18 0537  NA 136 137 138  --  138 139 140  K 3.7 3.7 3.6  --  4.3 4.3 4.0  CL 98 100 103  --  102 105 100  CO2 28 27 25   --  28 28 28   GLUCOSE 184* 107* 87  --  93 98 96  BUN 14 15 15   --  20 19 16   CREATININE 0.73 0.75 0.79 0.75 0.86 0.85 0.84  CALCIUM 9.2 9.1 9.2  --  8.7* 8.3* 8.9  MG 2.2 2.4  --  2.2  --   --   --    GFR: Estimated Creatinine Clearance: 105 mL/min (by C-G formula based on SCr of 0.84 mg/dL). Liver Function Tests: Recent Labs  Lab 11/17/18 0934 11/18/18 0216 11/19/18 0921 11/20/18 0155 11/22/18 0537  AST 58*  53* 107* 113* 55*  ALT 56* 54* 113* 131* 110*  ALKPHOS 44 46 45 39 77  BILITOT 0.7 0.7 1.0 0.6 0.2*  PROT 6.8 7.0 7.4 6.1* 6.5  ALBUMIN 4.2 4.3 4.6 3.8 4.0   Recent Labs  Lab 11/19/18 0921  LIPASE 32   No results for input(s): AMMONIA in the last 168 hours. Coagulation Profile: Recent Labs  Lab 11/20/18 0155  INR 1.0   Cardiac Enzymes: No results for input(s): CKTOTAL, CKMB, CKMBINDEX, TROPONINI in the last 168 hours. BNP (last 3 results) No results for input(s): PROBNP in the last 8760 hours. HbA1C: No results for input(s): HGBA1C in the last 72 hours. CBG: Recent  Labs  Lab 11/16/18 1425  GLUCAP 80   Lipid Profile: No results for input(s): CHOL, HDL, LDLCALC, TRIG, CHOLHDL, LDLDIRECT in the last 72 hours. Thyroid Function Tests: No results for input(s): TSH, T4TOTAL, FREET4, T3FREE, THYROIDAB in the last 72 hours. Anemia Panel: No results for input(s): VITAMINB12, FOLATE, FERRITIN, TIBC, IRON, RETICCTPCT in the last 72 hours. Sepsis Labs: No results for input(s): PROCALCITON, LATICACIDVEN in the last 168 hours.  Recent Results (from the past 240 hour(s))  SARS Coronavirus 2 (CEPHEID- Performed in [facility] hospital lab), Hosp Order     Status: None   Collection Time: 11/16/18  8:39 AM   Specimen: Nasopharyngeal Swab  Result Value Ref Range Status   SARS Coronavirus 2 NEGATIVE NEGATIVE Final    Comment: (NOTE) If result is NEGATIVE SARS-CoV-2 target nucleic acids are NOT DETECTED. The SARS-CoV-2 RNA is generally detectable in upper and lower  respiratory specimens during the acute phase of infection. The lowest  concentration of SARS-CoV-2 viral copies this assay can detect is 250  copies / mL. A negative result does not preclude SARS-CoV-2 infection  and should not be used as the sole basis for treatment or other  patient management decisions.  A negative result may occur with  improper specimen collection / handling, submission of specimen other  than  nasopharyngeal swab, presence of viral mutation(s) within the  areas targeted by this assay, and inadequate number of viral copies  (<250 copies / mL). A negative result must be combined with clinical  observations, patient history, and epidemiological information. If result is POSITIVE SARS-CoV-2 target nucleic acids are DETECTED. The SARS-CoV-2 RNA is generally detectable in upper and lower  respiratory specimens dur ing the acute phase of infection.  Positive  results are indicative of active infection with SARS-CoV-2.  Clinical  correlation with patient history and other diagnostic information is  necessary to determine patient infection status.  Positive results do  not rule out bacterial infection or co-infection with other viruses. If result is PRESUMPTIVE POSTIVE SARS-CoV-2 nucleic acids MAY BE PRESENT.   A presumptive positive result was obtained on the submitted specimen  and confirmed on repeat testing.  While 2019 novel coronavirus  (SARS-CoV-2) nucleic acids may be present in the submitted sample  additional confirmatory testing may be necessary for epidemiological  and / or clinical management purposes  to differentiate between  SARS-CoV-2 and other Sarbecovirus currently known to infect humans.  If clinically indicated additional testing with an alternate test  methodology [phone]([id number]) is advised. The SARS-CoV-2 RNA is generally  detectable in upper and lower respiratory sp ecimens during the acute  phase of infection. The expected result is Negative. Fact Sheet for Patients:  BoilerBrush.com.cyhttps://www.fda.gov/media/136312/download Fact Sheet for Healthcare Providers: https://pope.com/https://www.fda.gov/media/136313/download This test is not yet approved or cleared by the [place] FDA and has been authorized for detection and/or diagnosis of SARS-CoV-2 by FDA under an Emergency Use Authorization (EUA).  This EUA will remain in effect (meaning this test can be used) for the duration of the  COVID-19 declaration under Section 564(b)(1) of the Act, 21 U.S.C. section 360bbb-3(b)(1), unless the authorization is terminated or revoked sooner. Performed at [facility], [address] [place], [place] [address]   SARS Coronavirus 2 (CEPHEID - Performed in [facility] hospital lab), Hosp Order     Status: None   Collection Time: 11/19/18  9:21 AM   Specimen: Nasopharyngeal Swab  Result Value Ref Range Status   SARS Coronavirus 2 NEGATIVE NEGATIVE  Final    Comment: (NOTE) If result is NEGATIVE SARS-CoV-2 target nucleic acids are NOT DETECTED. The SARS-CoV-2 RNA is generally detectable in upper and lower  respiratory specimens during the acute phase of infection. The lowest  concentration of SARS-CoV-2 viral copies this assay can detect is 250  copies / mL. A negative result does not preclude SARS-CoV-2 infection  and should not be used as the sole basis for treatment or other  patient management decisions.  A negative result may occur with  improper specimen collection / handling, submission of specimen other  than nasopharyngeal swab, presence of viral mutation(s) within the  areas targeted by this assay, and inadequate number of viral copies  (<250 copies / mL). A negative result must be combined with clinical  observations, patient history, and epidemiological information. If result is POSITIVE SARS-CoV-2 target nucleic acids are DETECTED. The SARS-CoV-2 RNA is generally detectable in upper and lower  respiratory specimens dur ing the acute phase of infection.  Positive  results are indicative of active infection with SARS-CoV-2.  Clinical  correlation with patient history and other diagnostic information is  necessary to determine patient infection status.  Positive results do  not rule out bacterial infection or co-infection with other viruses. If result is PRESUMPTIVE POSTIVE SARS-CoV-2 nucleic acids MAY BE PRESENT.   A presumptive positive result  was obtained on the submitted specimen  and confirmed on repeat testing.  While 2019 novel coronavirus  (SARS-CoV-2) nucleic acids may be present in the submitted sample  additional confirmatory testing may be necessary for epidemiological  and / or clinical management purposes  to differentiate between  SARS-CoV-2 and other Sarbecovirus currently known to infect humans.  If clinically indicated additional testing with an alternate test  methodology [phone]([id number] is advised. The SARS-CoV-2 RNA is generally  detectable in upper and lower respiratory sp ecimens during the acute  phase of infection. The expected result is Negative. Fact Sheet for Patients:  BoilerBrush.com.cyhttps://www.fda.gov/media/136312/download Fact Sheet for Healthcare Providers: https://pope.com/https://www.fda.gov/media/136313/download This test is not yet approved or cleared by the [place] FDA and has been authorized for detection and/or diagnosis of SARS-CoV-2 by FDA under an Emergency Use Authorization (EUA).  This EUA will remain in effect (meaning this test can be used) for the duration of the COVID-19 declaration under Section 564(b)(1) of the Act, 21 U.S.C. section 360bbb-3(b)(1), unless the authorization is terminated or revoked sooner. Performed at [facility], [address] [place], [place] [address]          Radiology Studies: No results found.      Scheduled Meds: . chlordiazePOXIDE  25 mg Oral BH-qamhs   Followed by  . [START ON 11/23/2018] chlordiazePOXIDE  25 mg Oral Daily  . enoxaparin (LOVENOX) injection  40 mg Subcutaneous Q24H  . escitalopram  20 mg Oral Daily  . folic acid  1 mg Oral Daily  . multivitamin with minerals  1 tablet Oral Daily  . sodium chloride flush  3 mL Intravenous Q12H  . thiamine  100 mg Oral Daily   Or  . thiamine  100 mg Intravenous Daily   Continuous Infusions:    LOS: 2 days    Time spent: 35 minutes    Ramiro Harvestaniel Thompson, MD [facility]  If  7PM-7AM, please contact night-coverage [email or web address] 11/22/2018, 3:40 PM

## 2018-11-23 LAB — CBC
HCT: 43.2 % (ref 39.0–52.0)
Hemoglobin: 13.6 g/dL (ref 13.0–17.0)
MCH: 32.2 pg (ref 26.0–34.0)
MCHC: 31.5 g/dL (ref 30.0–36.0)
MCV: 102.4 fL — ABNORMAL HIGH (ref 80.0–100.0)
Platelets: 145 10*3/uL — ABNORMAL LOW (ref 150–400)
RBC: 4.22 MIL/uL (ref 4.22–5.81)
RDW: 14.2 % (ref 11.5–15.5)
WBC: 6.4 10*3/uL (ref 4.0–10.5)
nRBC: 0 % (ref 0.0–0.2)

## 2018-11-23 LAB — BASIC METABOLIC PANEL
Anion gap: 9 (ref 5–15)
BUN: 18 mg/dL (ref 6–20)
CO2: 28 mmol/L (ref 22–32)
Calcium: 8.9 mg/dL (ref 8.9–10.3)
Chloride: 102 mmol/L (ref 98–111)
Creatinine, Ser: 0.94 mg/dL (ref 0.61–1.24)
GFR calc Af Amer: >60 mL/min (ref >60)
GFR calc non Af Amer: >60 mL/min (ref >60)
Glucose, Bld: 94 mg/dL (ref 70–99)
Potassium: 4.2 mmol/L (ref 3.5–5.1)
Sodium: 139 mmol/L (ref 135–145)

## 2018-11-23 MED ORDER — ADULT MULTIVITAMIN W/MINERALS CH: 1.0000 | ORAL_TABLET | Freq: Every day | ORAL | Status: DC

## 2018-11-23 MED ORDER — FOLIC ACID 1 MG PO TABS: 1.0000 mg | ORAL_TABLET | Freq: Every day | ORAL | Status: DC

## 2018-11-23 MED ORDER — HYDROXYZINE HCL 25 MG PO TABS
25.0000 mg | ORAL_TABLET | Freq: Four times a day (QID) | ORAL | Status: DC | PRN
  Administered 2018-11-23: 25 mg via ORAL
  Filled 2018-11-23: qty 1

## 2018-11-23 MED ORDER — HYDROXYZINE HCL 25 MG PO TABS: 25.0000 mg | ORAL_TABLET | Freq: Four times a day (QID) | ORAL | 0 refills | Status: DC | PRN

## 2018-11-23 MED ORDER — THIAMINE HCL 100 MG PO TABS: 100.0000 mg | ORAL_TABLET | Freq: Every day | ORAL | Status: DC

## 2018-11-23 NOTE — Discharge Summary (Signed)
Physician Discharge Summary  Jermaine MoanMichael [name] UJW:[id number] DOB: [birth year] DOA: 11/19/2018  PCP: [name], [name], MD  Admit date: 11/19/2018 Discharge date: 11/23/2018  Time spent: 50 minutes  Recommendations for Outpatient Follow-up:  1. Follow-up with [name], [name], MD in 2 weeks.  On follow-up patient will need a comprehensive metabolic profile done to follow-up on electrolytes, renal function and LFTs.   Discharge Diagnoses:  Principal Problem:   Alcohol withdrawal (HCC) Active Problems:   Chronic alcohol abuse   Elevated liver enzymes   Thrombocytopenia (HCC)   Alcoholic cirrhosis of liver without ascites (HCC)   Anxiety and depression   Discharge Condition: Stable and improved  Diet recommendation: Regular  Filed Weights   11/21/18 0525 11/22/18 0500 11/23/18 0428  Weight: 79.8 kg 79.6 kg 80.4 kg    History of present illness:  Per Dr Jermaine [name] Jermaine [name] is a 53 y.o. male with medical history significant of chronic alcohol abuse with history of alcohol withdrawal, anxiety and depression presented to emergency department with a complaint of headache and tremors and the intention to seek help to go through alcohol withdrawal so he can quit.  Apparently patient was admitted at [facility] on 11/16/2018 for alcohol intoxication and withdrawal and left AGAINST MEDICAL ADVICE yesterday.  According to patient, he had significant tremors and was unable to walk properly even when he left the hospital yesterday.  In order to overcome that, he drank 1/5 of vodka last night and felt better only to start having tremors once again this morning.  Again he realizes that he needs to quit so he came to the emergency department to seek help.  He has anxiety and headache currently.  No other complaint.  Cording to patient, he has been admitted in the ICU before with severe withdrawal however has never had any alcohol withdrawal seizures.  He tells me that he was at the verge of intubation at  one point in time.  He further tells me that there was a time that he was sober for 2 years and then he started drinking for quite some time and then he was sober for another 9 months and started drinking again about 3 to 4 weeks ago and has been drinking fifth of vodka every day ever since.  ED Course: Upon arrival to the emergency department, he was hemodynamically stable however had significant tremors with CIWA score of 14 and he was given a dose of Ativan.  He has chronic thrombocytopenia and alcoholic liver disease and both are stable with no signs of bleeding.  Hospital service was requested to admit the patient for alcohol withdrawal and further management.  Hospital Course:  1 alcohol withdrawal/chronic alcohol abuse On admission patient noted to have started significant alcohol withdrawal with a CIWA of 14 in the ED.  Patient initially placed in the stepdown unit for closer monitoring.  Patient was started on the Ativan withdrawal protocol.  Librium detox protocol was added to the regimen and patient maintained on thiamine folic acid and multivitamin.  Patient was also hydrated with IV fluids and placed on supportive care.  Patient improved clinically during the hospitalization subsequently transferred to the telemetry floor.  Patient completed the Librium detox protocol.  Was in stable condition.  Patient noted to be interested in alcohol cessation and help for withdrawal.  Social work was consulted.  Patient be discharged home in stable and improved condition with outpatient follow-up.    2.  Chronic thrombocytopenia Stable.  No signs of bleeding.  3.  Alcoholic liver disease Stable.  Outpatient follow-up.  4.  Transaminitis Likely secondary to problem #3.  Patient with no abdominal pain and no ascites noted.  Acute hepatitis panel is negative.  Outpatient follow-up.  5.  Depression and anxiety Remained stable.  Patient maintained on home regimen of Lexapro.  Patient was also on  the Ativan withdrawal protocol and Librium detox protocol during the hospitalization.  Outpatient follow-up with PCP.   Procedures:  Chest x-ray 11/19/2018  Consultations:  None  Discharge Exam: Vitals:   11/22/18 2201 11/23/18 0428  BP: (!) 93/54 92/67  Pulse: 78 60  Resp: 15 16  Temp: 98.5 F (36.9 C) 97.8 F (36.6 C)  SpO2: 94% 96%    General: NAD Cardiovascular: RRR Respiratory: CTAB  Discharge Instructions   Discharge Instructions    Diet general   Complete by: As directed    Increase activity slowly   Complete by: As directed      Allergies as of 11/23/2018   No Known Allergies     Medication List    STOP taking these medications   chlordiazePOXIDE 25 MG capsule Commonly known as: LIBRIUM     TAKE these medications   escitalopram 20 MG tablet Commonly known as: LEXAPRO Take 20 mg by mouth daily.   folic acid 1 MG tablet Commonly known as: FOLVITE Take 1 tablet (1 mg total) by mouth daily. Start taking on: November 24, 2018   hydrOXYzine 25 MG tablet Commonly known as: ATARAX/VISTARIL Take 1 tablet (25 mg total) by mouth every 6 (six) hours as needed for anxiety.   multivitamin with minerals Tabs tablet Take 1 tablet by mouth daily. Start taking on: November 24, 2018   thiamine 100 MG tablet Take 1 tablet (100 mg total) by mouth daily. Start taking on: November 24, 2018      No Known Allergies Follow-up Information    [name], [name], MD. Schedule an appointment as soon as possible for a visit in 2 week(s).   Specialty: Family Medicine Contact information: [address] [place] [address] [phone]            The results of significant diagnostics from this hospitalization (including imaging, microbiology, ancillary and laboratory) are listed below for reference.    Significant Diagnostic Studies: Dg Chest Portable 1 View  Result Date: 11/19/2018 CLINICAL DATA:  Shortness of breath and cough. EXAM: PORTABLE  CHEST 1 VIEW COMPARISON:  None. FINDINGS: Heart size and pulmonary vascularity are normal. There is fullness in the region of the main pulmonary artery. Lungs are clear. No effusions. Bones are normal. IMPRESSION: Soft tissue prominence in the region of the main pulmonary artery, nonspecific. This could be the result of pulmonic valve stenosis or pulmonary arterial hypertension. Adenopathy could also cause this appearance. No prior studies available for comparison. Comparison with outside prior studies or CT scan of the chest with contrast may be helpful. Electronically Signed   By: Francene BoyersJames  Maxwell M.D.   On: 11/19/2018 09:36    Microbiology: Recent Results (from the past 240 hour(s))  SARS Coronavirus 2 (CEPHEID- Performed in [facility] hospital lab), Hosp Order     Status: None   Collection Time: 11/16/18  8:39 AM   Specimen: Nasopharyngeal Swab  Result Value Ref Range Status   SARS Coronavirus 2 NEGATIVE NEGATIVE Final    Comment: (NOTE) If result is NEGATIVE SARS-CoV-2 target nucleic acids are NOT DETECTED. The SARS-CoV-2 RNA is generally detectable in upper and lower  respiratory specimens during the acute phase of infection. The lowest  concentration of SARS-CoV-2 viral copies this assay can detect is 250  copies / mL. A negative result does not preclude SARS-CoV-2 infection  and should not be used as the sole basis for treatment or other  patient management decisions.  A negative result may occur with  improper specimen collection / handling, submission of specimen other  than nasopharyngeal swab, presence of viral mutation(s) within the  areas targeted by this assay, and inadequate number of viral copies  (<250 copies / mL). A negative result must be combined with clinical  observations, patient history, and epidemiological information. If result is POSITIVE SARS-CoV-2 target nucleic acids are DETECTED. The SARS-CoV-2 RNA is generally detectable in upper and lower  respiratory  specimens dur ing the acute phase of infection.  Positive  results are indicative of active infection with SARS-CoV-2.  Clinical  correlation with patient history and other diagnostic information is  necessary to determine patient infection status.  Positive results do  not rule out bacterial infection or co-infection with other viruses. If result is PRESUMPTIVE POSTIVE SARS-CoV-2 nucleic acids MAY BE PRESENT.   A presumptive positive result was obtained on the submitted specimen  and confirmed on repeat testing.  While 2019 novel coronavirus  (SARS-CoV-2) nucleic acids may be present in the submitted sample  additional confirmatory testing may be necessary for epidemiological  and / or clinical management purposes  to differentiate between  SARS-CoV-2 and other Sarbecovirus currently known to infect humans.  If clinically indicated additional testing with an alternate test  methodology [phone]([id number]) is advised. The SARS-CoV-2 RNA is generally  detectable in upper and lower respiratory sp ecimens during the acute  phase of infection. The expected result is Negative. Fact Sheet for Patients:  BoilerBrush.com.cyhttps://www.fda.gov/media/136312/download Fact Sheet for Healthcare Providers: https://pope.com/https://www.fda.gov/media/136313/download This test is not yet approved or cleared by the [place] FDA and has been authorized for detection and/or diagnosis of SARS-CoV-2 by FDA under an Emergency Use Authorization (EUA).  This EUA will remain in effect (meaning this test can be used) for the duration of the COVID-19 declaration under Section 564(b)(1) of the Act, 21 U.S.C. section 360bbb-3(b)(1), unless the authorization is terminated or revoked sooner. Performed at [facility], [address] [place], [place] [address]   SARS Coronavirus 2 (CEPHEID - Performed in [facility] hospital lab), Hosp Order     Status: None   Collection Time: 11/19/18  9:21 AM   Specimen: Nasopharyngeal Swab   Result Value Ref Range Status   SARS Coronavirus 2 NEGATIVE NEGATIVE Final    Comment: (NOTE) If result is NEGATIVE SARS-CoV-2 target nucleic acids are NOT DETECTED. The SARS-CoV-2 RNA is generally detectable in upper and lower  respiratory specimens during the acute phase of infection. The lowest  concentration of SARS-CoV-2 viral copies this assay can detect is 250  copies / mL. A negative result does not preclude SARS-CoV-2 infection  and should not be used as the sole basis for treatment or other  patient management decisions.  A negative result may occur with  improper specimen collection / handling, submission of specimen other  than nasopharyngeal swab, presence of viral mutation(s) within the  areas targeted by this assay, and inadequate number of viral copies  (<250 copies / mL). A negative result must be combined with clinical  observations, patient history, and epidemiological information. If result is POSITIVE SARS-CoV-2 target nucleic acids are DETECTED. The SARS-CoV-2 RNA is generally detectable  in upper and lower  respiratory specimens dur ing the acute phase of infection.  Positive  results are indicative of active infection with SARS-CoV-2.  Clinical  correlation with patient history and other diagnostic information is  necessary to determine patient infection status.  Positive results do  not rule out bacterial infection or co-infection with other viruses. If result is PRESUMPTIVE POSTIVE SARS-CoV-2 nucleic acids MAY BE PRESENT.   A presumptive positive result was obtained on the submitted specimen  and confirmed on repeat testing.  While 2019 novel coronavirus  (SARS-CoV-2) nucleic acids may be present in the submitted sample  additional confirmatory testing may be necessary for epidemiological  and / or clinical management purposes  to differentiate between  SARS-CoV-2 and other Sarbecovirus currently known to infect humans.  If clinically indicated additional  testing with an alternate test  methodology [phone]) is advised. The SARS-CoV-2 RNA is generally  detectable in upper and lower respiratory sp ecimens during the acute  phase of infection. The expected result is Negative. Fact Sheet for Patients:  BoilerBrush.com.cy Fact Sheet for Healthcare Providers: https://pope.com/ This test is not yet approved or cleared by the [place] FDA and has been authorized for detection and/or diagnosis of SARS-CoV-2 by FDA under an Emergency Use Authorization (EUA).  This EUA will remain in effect (meaning this test can be used) for the duration of the COVID-19 declaration under Section 564(b)(1) of the Act, 21 U.S.C. section 360bbb-3(b)(1), unless the authorization is terminated or revoked sooner. Performed at [facility], [address], [place], [place] [address]      Labs: Basic Metabolic Panel: Recent Labs  Lab 11/17/18 [phone] 11/18/18 0216 11/19/18 1478 11/19/18 1359 11/20/18 0155 11/21/18 0546 11/22/18 0537 11/23/18 0553  NA 136 137 138  --  138 139 140 139  K 3.7 3.7 3.6  --  4.3 4.3 4.0 4.2  CL 98 100 103  --  102 105 100 102  CO2 --  GLUCOSE 184* 107* 87  --  93 98 96 94  BUN --  CREATININE 0.73 0.75 0.79 0.75 0.86 0.85 0.84 0.94  CALCIUM 9.2 9.1 9.2  --  8.7* 8.3* 8.9 8.9  MG 2.2 2.4  --  2.2  --   --   --   --    Liver Function Tests: Recent Labs  Lab 11/17/18 0934 11/18/18 0216 11/19/18 0921 11/20/18 0155 11/22/18 0537  AST 58* 53* 107* 113* 55*  ALT 56* 54* 113* 131* 110*  ALKPHOS 44 46 45 39 77  BILITOT 0.7 0.7 1.0 0.6 0.2*  PROT 6.8 7.0 7.4 6.1* 6.5  ALBUMIN 4.2 4.3 4.6 3.8 4.0   Recent Labs  Lab 11/19/18 0921  LIPASE 32   No results for input(s): AMMONIA in the last 168 hours. CBC: Recent Labs  Lab 11/19/18 0921 11/19/18 1359 11/20/18 0155 11/21/18 0546 11/23/18 0553  WBC 4.8 5.1  5.7 5.2 6.4  NEUTROABS 3.1  --   --   --   --   HGB 14.5 14.1 13.1 13.1 13.6  HCT 43.6 42.0 41.4 42.0 43.2  MCV 98.0 99.3 100.7* 102.7* 102.4*  PLT 116* 113* 114* 113* 145*   Cardiac Enzymes: No results for input(s): CKTOTAL, CKMB, CKMBINDEX, TROPONINI in the last 168 hours. BNP: BNP (last 3 results) No results for input(s): BNP in the last 8760 hours.  ProBNP (last 3 results)  No results for input(s): PROBNP in the last 8760 hours.  CBG: Recent Labs  Lab 11/16/18 1425  GLUCAP 80       Signed:  Irine Seal MD.  [facility] 11/23/2018, 11:57 AM

## 2018-11-23 NOTE — Progress Notes (Signed)
Pt leaving this afternoon via self. (Pt drove to hospital.) Alert, oriented, and without c/o. Discharge instructions/prescription given/explained with pt verbalizing understanding.  Work letter given to patient from MD.

## 2019-02-05 ENCOUNTER — Encounter (HOSPITAL_COMMUNITY): Payer: Self-pay

## 2019-02-05 ENCOUNTER — Other Ambulatory Visit: Payer: Self-pay

## 2019-02-05 ENCOUNTER — Observation Stay (HOSPITAL_COMMUNITY)
Admission: EM | Admit: 2019-02-05 | Discharge: 2019-02-06 | Disposition: A | Discharge status: Home / Self Care | Payer: BC Managed Care – PPO | Attending: Internal Medicine | Admitting: Internal Medicine

## 2019-02-05 DIAGNOSIS — Z79899 Other long term (current) drug therapy: Secondary | ICD-10-CM | POA: Insufficient documentation

## 2019-02-05 DIAGNOSIS — F419 Anxiety disorder, unspecified: Secondary | ICD-10-CM | POA: Diagnosis not present

## 2019-02-05 DIAGNOSIS — F10239 Alcohol dependence with withdrawal, unspecified: Secondary | ICD-10-CM | POA: Diagnosis present

## 2019-02-05 DIAGNOSIS — F10930 Alcohol use, unspecified with withdrawal, uncomplicated: Secondary | ICD-10-CM

## 2019-02-05 DIAGNOSIS — F10231 Alcohol dependence with withdrawal delirium: Secondary | ICD-10-CM | POA: Diagnosis not present

## 2019-02-05 DIAGNOSIS — F329 Major depressive disorder, single episode, unspecified: Secondary | ICD-10-CM | POA: Insufficient documentation

## 2019-02-05 DIAGNOSIS — F10230 Alcohol dependence with withdrawal, uncomplicated: Secondary | ICD-10-CM | POA: Diagnosis not present

## 2019-02-05 DIAGNOSIS — Z20828 Contact with and (suspected) exposure to other viral communicable diseases: Secondary | ICD-10-CM | POA: Diagnosis not present

## 2019-02-05 DIAGNOSIS — F10939 Alcohol use, unspecified with withdrawal, unspecified: Secondary | ICD-10-CM | POA: Diagnosis present

## 2019-02-05 DIAGNOSIS — G9341 Metabolic encephalopathy: Secondary | ICD-10-CM | POA: Diagnosis present

## 2019-02-05 LAB — COMPREHENSIVE METABOLIC PANEL
ALT: 32 U/L (ref 0–44)
AST: 51 U/L — ABNORMAL HIGH (ref 15–41)
Albumin: 4.4 g/dL (ref 3.5–5.0)
Alkaline Phosphatase: 38 U/L (ref 38–126)
Anion gap: 19 — ABNORMAL HIGH (ref 5–15)
BUN: 19 mg/dL (ref 6–20)
CO2: 21 mmol/L — ABNORMAL LOW (ref 22–32)
Calcium: 8.7 mg/dL — ABNORMAL LOW (ref 8.9–10.3)
Chloride: 101 mmol/L (ref 98–111)
Creatinine, Ser: 1.1 mg/dL (ref 0.61–1.24)
GFR calc Af Amer: >60 mL/min (ref >60)
GFR calc non Af Amer: >60 mL/min (ref >60)
Glucose, Bld: 94 mg/dL (ref 70–99)
Potassium: 4.8 mmol/L (ref 3.5–5.1)
Sodium: 141 mmol/L (ref 135–145)
Total Bilirubin: 1 mg/dL (ref 0.3–1.2)
Total Protein: 8 g/dL (ref 6.5–8.1)

## 2019-02-05 LAB — CBC WITH DIFFERENTIAL/PLATELET
Abs Immature Granulocytes: 0.03 10*3/uL (ref 0.00–0.07)
Basophils Absolute: 0 10*3/uL (ref 0.0–0.1)
Basophils Relative: 0 %
Eosinophils Absolute: 0 10*3/uL (ref 0.0–0.5)
Eosinophils Relative: 0 %
HCT: 46 % (ref 39.0–52.0)
Hemoglobin: 15.6 g/dL (ref 13.0–17.0)
Immature Granulocytes: 0 %
Lymphocytes Relative: 23 %
Lymphs Abs: 2.1 10*3/uL (ref 0.7–4.0)
MCH: 31.6 pg (ref 26.0–34.0)
MCHC: 33.9 g/dL (ref 30.0–36.0)
MCV: 93.3 fL (ref 80.0–100.0)
Monocytes Absolute: 0.5 10*3/uL (ref 0.1–1.0)
Monocytes Relative: 6 %
Neutro Abs: 6.5 10*3/uL (ref 1.7–7.7)
Neutrophils Relative %: 71 %
Platelets: 244 10*3/uL (ref 150–400)
RBC: 4.93 MIL/uL (ref 4.22–5.81)
RDW: 13.3 % (ref 11.5–15.5)
WBC: 9.1 10*3/uL (ref 4.0–10.5)
nRBC: 0 % (ref 0.0–0.2)

## 2019-02-05 LAB — RAPID URINE DRUG SCREEN, HOSP PERFORMED
Amphetamines: NOT DETECTED
Barbiturates: NOT DETECTED
Benzodiazepines: POSITIVE — AB
Cocaine: NOT DETECTED
Opiates: NOT DETECTED
Tetrahydrocannabinol: NOT DETECTED

## 2019-02-05 LAB — ETHANOL: Alcohol, Ethyl (B): 86 mg/dL — ABNORMAL HIGH (ref <10)

## 2019-02-05 LAB — PHOSPHORUS: Phosphorus: 4.2 mg/dL (ref 2.5–4.6)

## 2019-02-05 LAB — MRSA PCR SCREENING: MRSA by PCR: NEGATIVE

## 2019-02-05 LAB — MAGNESIUM: Magnesium: 2.5 mg/dL — ABNORMAL HIGH (ref 1.7–2.4)

## 2019-02-05 LAB — SARS CORONAVIRUS 2 (TAT 6-24 HRS): SARS Coronavirus 2: NEGATIVE

## 2019-02-05 MED ORDER — CHLORDIAZEPOXIDE HCL 25 MG PO CAPS: 25.0000 mg | ORAL_CAPSULE | Freq: Every day | ORAL | 0 refills | Status: DC

## 2019-02-05 MED ORDER — LORAZEPAM 2 MG/ML IJ SOLN: 0.0000 mg | Freq: Three times a day (TID) | INTRAMUSCULAR | Status: DC

## 2019-02-05 MED ORDER — LORAZEPAM 1 MG PO TABS
1.0000 mg | ORAL_TABLET | ORAL | Status: DC | PRN
  Administered 2019-02-05 – 2019-02-06 (×2): 2 mg via ORAL
  Filled 2019-02-05 (×2): qty 2

## 2019-02-05 MED ORDER — CHLORDIAZEPOXIDE HCL 25 MG PO CAPS
25.0000 mg | ORAL_CAPSULE | Freq: Every day | ORAL | Status: DC
  Administered 2019-02-06: 25 mg via ORAL
  Filled 2019-02-05: qty 1

## 2019-02-05 MED ORDER — CHLORHEXIDINE GLUCONATE CLOTH 2 % EX PADS
6.0000 | MEDICATED_PAD | Freq: Every day | CUTANEOUS | Status: DC
  Administered 2019-02-05: 6 via TOPICAL

## 2019-02-05 MED ORDER — CHLORDIAZEPOXIDE HCL 25 MG PO CAPS
50.0000 mg | ORAL_CAPSULE | Freq: Once | ORAL | Status: AC
  Administered 2019-02-05: 50 mg via ORAL
  Filled 2019-02-05: qty 2

## 2019-02-05 MED ORDER — ACETAMINOPHEN 650 MG RE SUPP: 650.0000 mg | Freq: Four times a day (QID) | RECTAL | Status: DC | PRN

## 2019-02-05 MED ORDER — LORAZEPAM 2 MG/ML IJ SOLN
0.0000 mg | Freq: Four times a day (QID) | INTRAMUSCULAR | Status: DC
  Administered 2019-02-05: 2 mg via INTRAVENOUS
  Administered 2019-02-05: 4 mg via INTRAVENOUS
  Filled 2019-02-05: qty 2
  Filled 2019-02-05 (×2): qty 1

## 2019-02-05 MED ORDER — LORAZEPAM 2 MG/ML IJ SOLN
1.0000 mg | INTRAMUSCULAR | Status: DC | PRN
  Administered 2019-02-05 (×2): 2 mg via INTRAVENOUS
  Administered 2019-02-05: 4 mg via INTRAVENOUS
  Filled 2019-02-05: qty 2
  Filled 2019-02-05: qty 1

## 2019-02-05 MED ORDER — THIAMINE HCL 100 MG/ML IJ SOLN
Freq: Once | INTRAVENOUS | Status: AC
  Administered 2019-02-05: 03:00:00 via INTRAVENOUS
  Filled 2019-02-05: qty 1000

## 2019-02-05 MED ORDER — LORAZEPAM 2 MG/ML IJ SOLN
0.0000 mg | INTRAMUSCULAR | Status: DC
  Administered 2019-02-05: 2 mg via INTRAVENOUS
  Administered 2019-02-06: 1 mg via INTRAVENOUS
  Administered 2019-02-06 (×2): 2 mg via INTRAVENOUS
  Filled 2019-02-05 (×5): qty 1

## 2019-02-05 MED ORDER — ACETAMINOPHEN 325 MG PO TABS
650.0000 mg | ORAL_TABLET | Freq: Four times a day (QID) | ORAL | Status: DC | PRN
  Administered 2019-02-05 (×2): 650 mg via ORAL
  Filled 2019-02-05 (×2): qty 2

## 2019-02-05 MED ORDER — VITAMIN B-12 1000 MCG PO TABS: 2000.0000 ug | ORAL_TABLET | Freq: Every day | ORAL | 0 refills | Status: DC

## 2019-02-05 MED ORDER — FOLIC ACID 1 MG PO TABS: 1.0000 mg | ORAL_TABLET | Freq: Every day | ORAL | 0 refills | Status: DC

## 2019-02-05 MED ORDER — SODIUM CHLORIDE 0.9% FLUSH
3.0000 mL | Freq: Two times a day (BID) | INTRAVENOUS | Status: DC
  Administered 2019-02-05 – 2019-02-06 (×3): 3 mL via INTRAVENOUS

## 2019-02-05 MED ORDER — VITAMIN B-1 100 MG PO TABS
100.0000 mg | ORAL_TABLET | Freq: Every day | ORAL | Status: DC
  Administered 2019-02-06: 100 mg via ORAL
  Filled 2019-02-05: qty 1

## 2019-02-05 MED ORDER — ONDANSETRON HCL 4 MG/2ML IJ SOLN
4.0000 mg | Freq: Once | INTRAMUSCULAR | Status: AC
  Administered 2019-02-05: 4 mg via INTRAVENOUS
  Filled 2019-02-05: qty 2

## 2019-02-05 MED ORDER — LORAZEPAM 2 MG/ML IJ SOLN
2.0000 mg | Freq: Once | INTRAMUSCULAR | Status: AC
  Administered 2019-02-05: 2 mg via INTRAVENOUS
  Filled 2019-02-05: qty 1

## 2019-02-05 MED ORDER — THIAMINE HCL 100 MG/ML IJ SOLN: 100.0000 mg | Freq: Every day | INTRAMUSCULAR | Status: DC

## 2019-02-05 MED ORDER — ADULT MULTIVITAMIN W/MINERALS CH
1.0000 | ORAL_TABLET | Freq: Every day | ORAL | Status: DC
  Administered 2019-02-06: 09:00:00 1 via ORAL
  Filled 2019-02-05: qty 1

## 2019-02-05 MED ORDER — FOLIC ACID 1 MG PO TABS
1.0000 mg | ORAL_TABLET | Freq: Every day | ORAL | Status: DC
  Administered 2019-02-06: 1 mg via ORAL
  Filled 2019-02-05: qty 1

## 2019-02-05 MED ORDER — ENOXAPARIN SODIUM 40 MG/0.4ML ~~LOC~~ SOLN
40.0000 mg | SUBCUTANEOUS | Status: DC
  Administered 2019-02-05: 40 mg via SUBCUTANEOUS
  Filled 2019-02-05: qty 0.4

## 2019-02-05 MED ORDER — SODIUM CHLORIDE 0.9 % IV SOLN
INTRAVENOUS | Status: DC
  Administered 2019-02-05: 14:00:00 via INTRAVENOUS

## 2019-02-05 MED ORDER — ONDANSETRON HCL 4 MG PO TABS: 4.0000 mg | ORAL_TABLET | Freq: Four times a day (QID) | ORAL | Status: DC | PRN

## 2019-02-05 MED ORDER — LORAZEPAM 1 MG PO TABS: 0.0000 mg | ORAL_TABLET | Freq: Four times a day (QID) | ORAL | Status: DC

## 2019-02-05 MED ORDER — ADULT MULTIVITAMIN W/MINERALS CH: 1.0000 | ORAL_TABLET | Freq: Every day | ORAL | 0 refills | Status: DC

## 2019-02-05 MED ORDER — ONDANSETRON HCL 4 MG/2ML IJ SOLN
4.0000 mg | Freq: Four times a day (QID) | INTRAMUSCULAR | Status: DC | PRN
  Filled 2019-02-05: qty 2

## 2019-02-05 MED ORDER — THIAMINE HCL 100 MG PO TABS: 100.0000 mg | ORAL_TABLET | Freq: Every day | ORAL | 0 refills | Status: DC

## 2019-02-05 MED ORDER — LORAZEPAM 2 MG/ML IJ SOLN: 0.0000 mg | INTRAMUSCULAR | Status: DC

## 2019-02-05 NOTE — ED Notes (Signed)
Pt provided with lunch tray.

## 2019-02-05 NOTE — ED Triage Notes (Signed)
Patient has been drinking for 5 days straight approximately 20 vodka seltzers ([employer]) per day. Last drink was 02-04-19 at 1500.

## 2019-02-05 NOTE — Discharge Summary (Signed)
Patient ID: Jermaine [name] MRN: [id number] DOB/AGE: 08/02/1965 53 y.o.  Admit date: 02/05/2019 Discharge date: 02/05/2019  Primary Care Physician:  [name], [name], MD  Discharge Diagnoses:   Present on Admission: . Alcohol withdrawal (HCC)   Consults:  None  Alcohol abuse with withdrawal Pt was placed on CIWA Librium 50mg  po (9/10) Librium 25mg  po (9/11) Librium 25mg  po qday x 1 day (9/12) Pt encouraged to stop etoh intake.  Please take thiamine 100mg  po qday Please take folic acid 1mg  po qday Please take b12 2000micrograms po qday Please follow up w pcp in 2 weeks.   Soft tissue prominence in the region of the main pulmonary artery On CXR 11/19/2018 IMPRESSION: Soft tissue prominence in the region of the main pulmonary artery, nonspecific. This could be the result of pulmonic valve stenosis or pulmonary arterial hypertension. Adenopathy could also cause this appearance. No prior studies available for comparison. Comparison with outside prior studies or CT scan of the chest with contrast may be helpful.  Discharge Medications: Allergies as of 02/05/2019   No Known Allergies     Medication List    TAKE these medications   chlordiazePOXIDE 25 MG capsule Commonly known as: LIBRIUM Take 1 capsule (25 mg total) by mouth daily. Start taking on: February 06, 2019   folic acid 1 MG tablet Commonly known as: FOLVITE Take 1 tablet (1 mg total) by mouth daily. Start taking on: February 06, 2019   hydrOXYzine 25 MG tablet Commonly known as: ATARAX/VISTARIL Take 1 tablet (25 mg total) by mouth every 6 (six) hours as needed for anxiety.   multivitamin with minerals Tabs tablet Take 1 tablet by mouth daily. Start taking on: February 06, 2019   thiamine 100 MG tablet Take 1 tablet (100 mg total) by mouth daily. Start taking on: February 06, 2019   vitamin B-12 1000 MCG tablet Commonly known as: CYANOCOBALAMIN Take 2 tablets (2,000 mcg total) by mouth daily.         Brief H and P: 53 y.o.malewith medical history significant foralcohol abuse, now presenting to emergency department for evaluation of nausea, tremulousness, and malaise. Patient reports that he had been drinking increased amount of alcohol recently, roughly 20 drinks daily, reports his last drink to been around 1500 on 02/04/2019. He has developed tremor, nausea, and general malaise, reports experiencing similar symptoms previously when he stopped drinking alcohol, reports a history of delirium tremens, was concern for impending DTs, and therefore presents to the ED for help. History is somewhat limited by patient's sedation after receiving Ativan in the ED, and he is unable to identify any trigger for his recent heavy drinking. He is unable to say how long he maintain sobriety after his admission for alcohol withdrawal in June 2020, but does note that he had abstained for 2 years previously.  ED Course:Upon arrival to the ED, patient is found to be afebrile, saturating 98% on room air, slightly tachycardic, and with systolic blood pressure 116. Chemistry panel features a bicarbonate of 21 and AST of 51. CBC is unremarkable. Ethanol level is elevated to 86. Patient was given IV fluids, Zofran, and Ativan in the emergency department, and hospitalists are consulted for admission.   Hospital Course:  Pt was admitted and placed on CIWA,  Pt started on librium 50mg  po qday x 1 days , pt feeling much better.  Pt feels like he has avoided withdrawal. Discussed detox but pt is not interested in inpatient detox at this time.  Pt's last know  etoh use about 3pm the day of admission as described in HPI.  Pt knows he can return to ER for inpatient detox if he so desires.  Educated about symptoms of withdrawal.  Pt feeling well and would like to be discharged.     Principal Problem:   Alcohol withdrawal ([place])   Day of Discharge BP 136/79   Pulse 65   Temp 98.3 F (36.8 C) (Oral)   Resp 19    Ht 5\' 10"  (1.778 m)   Wt 87 kg   SpO2 97%   BMI 27.52 kg/m   Physical Exam: General: Alert and awake oriented x3 not in any acute distress. HEENT: anicteric sclera, pupils reactive to light and accommodation CVS: S1-S2 clear no murmur rubs or gallops Chest: clear to auscultation bilaterally, no wheezing rales or rhonchi Abdomen: soft nontender, nondistended, normal bowel sounds, no organomegaly Extremities: no cyanosis, clubbing or edema noted bilaterally Neuro: Cranial nerves II-XII intact, no focal neurological deficits   The results of significant diagnostics from this hospitalization (including imaging, microbiology, ancillary and laboratory) are listed below for reference.    LAB RESULTS: Basic Metabolic Panel: Recent Labs  Lab 02/05/19 0216 02/05/19 1404  NA 141  --   K 4.8  --   CL 101  --   CO2 21*  --   GLUCOSE 94  --   BUN 19  --   CREATININE 1.10  --   CALCIUM 8.7*  --   MG  --  2.5*  PHOS  --  4.2   Liver Function Tests: Recent Labs  Lab 02/05/19 0216  AST 51*  ALT 32  ALKPHOS 38  BILITOT 1.0  PROT 8.0  ALBUMIN 4.4   No results for input(s): LIPASE, AMYLASE in the last 168 hours. No results for input(s): AMMONIA in the last 168 hours. CBC: Recent Labs  Lab 02/05/19 0216  WBC 9.1  NEUTROABS 6.5  HGB 15.6  HCT 46.0  MCV 93.3  PLT 244   Cardiac Enzymes: No results for input(s): CKTOTAL, CKMB, CKMBINDEX, TROPONINI in the last 168 hours. BNP: Invalid input(s): POCBNP CBG: No results for input(s): GLUCAP in the last 168 hours.  Significant Diagnostic Studies:  No results found.   Disposition and Follow-up:    DISPOSITION: home  DIET: regular  ACTIVITY: as tolerated  TESTS THAT NEED FOLLOW-UP CXR 11/19/2018-> IMPRESSION: Soft tissue prominence in the region of the main pulmonary artery, nonspecific. This could be the result of pulmonic valve stenosis or pulmonary arterial hypertension. Adenopathy could also cause this appearance.  No prior studies available for comparison. Comparison with outside prior studies or CT scan of the chest with contrast may be helpful.  DISCHARGE FOLLOW-UP Follow-up Information    London Pepper, MD Follow up in 2 week(s).   Specialty: Family Medicine Contact information: [place] [address] [phone]           Time spent on Discharge: 35  Signed:   Jani Gravel M.D. [facility] 02/05/2019, 10:30 PM Pager: [phone]    If 7PM-7AM, please contact night-coverage [email or web address] Password [email or web address]

## 2019-02-05 NOTE — ED Notes (Signed)
ED TO INPATIENT HANDOFF REPORT  Name/Age/Gender Jermaine [name] 53 y.o. male  Code Status    Code Status Orders  (From admission, onward)         Start     Ordered   02/05/19 1105  Full code  Continuous     02/05/19 1104        Code Status History    Date Active Date Inactive Code Status Order ID Comments User Context   11/19/2018 1331 11/23/2018 1741 Full Code [id number]  Hughie ClossPahwani, Ravi, MD Inpatient   11/16/2018 1329 11/18/2018 1846 Full Code [id number]  Burnadette PopAdhikari, Amrit, MD ED   11/16/2018 0803 11/16/2018 1329 Full Code [id number]  Alden HippMurphy, Laura A, PA-C ED   Advance Care Planning Activity      Home/SNF/Other Home  Chief Complaint Alcohol Detox  Level of Care/Admitting Diagnosis ED Disposition    ED Disposition Condition Comment   Admit  [facility] [[id number]]  Level of Care: Stepdown [14]  Admit to SDU based on following criteria: Severe physiological/psychological symptoms:  Any diagnosis requiring assessment & intervention at least every 4 hours on an ongoing basis to obtain desired patient outcomes including stability and rehabilitation  Covid Evaluation: Asymptomatic Screening Protocol (No Symptoms)  Diagnosis: Alcohol withdrawal (HCC) [291.81.ICD-9-CM]  Admitting Physician: Briscoe DeutscherOPYD, TIMOTHY S [[id number]]  Attending Physician: Briscoe DeutscherPYD, TIMOTHY S [[id number]]  PT Class (Do Not Modify): Observation [104]  PT Acc Code (Do Not Modify): Observation [10022]       Medical History Past Medical History:  Diagnosis Date  . Alcohol abuse   . Anxiety   . Depression     Allergies No Known Allergies  IV Location/Drains/Wounds Patient Lines/Drains/Airways Status   Active Line/Drains/Airways    Name:   Placement date:   Placement time:   Site:   Days:   Peripheral IV 02/05/19 Left Wrist   02/05/19    0215    Wrist   less than 1          Labs/Imaging Results for orders placed or performed during the hospital encounter of 02/05/19 (from the past  48 hour(s))  Ethanol     Status: Abnormal   Collection Time: 02/05/19  2:16 AM  Result Value Ref Range   Alcohol, Ethyl (B) 86 (H) <10 mg/dL    Comment: (NOTE) Lowest detectable limit for serum alcohol is 10 mg/dL. For medical purposes only. Performed at [facility], [address] [place], [place] [address]   Comprehensive metabolic panel     Status: Abnormal   Collection Time: 02/05/19  2:16 AM  Result Value Ref Range   Sodium 141 135 - 145 mmol/L   Potassium 4.8 3.5 - 5.1 mmol/L   Chloride 101 98 - 111 mmol/L   CO2 21 (L) 22 - 32 mmol/L   Glucose, Bld 94 70 - 99 mg/dL   BUN 19 6 - 20 mg/dL   Creatinine, Ser 4.401.10 0.61 - 1.24 mg/dL   Calcium 8.7 (L) 8.9 - 10.3 mg/dL   Total Protein 8.0 6.5 - 8.1 g/dL   Albumin 4.4 3.5 - 5.0 g/dL   AST 51 (H) 15 - 41 U/L   ALT 32 0 - 44 U/L   Alkaline Phosphatase 38 38 - 126 U/L   Total Bilirubin 1.0 0.3 - 1.2 mg/dL   GFR calc non Af Amer >60 >60 mL/min   GFR calc Af Amer >60 >60 mL/min   Anion gap 19 (H) 5 - 15  Comment: Performed at [facility], [address] [place], [place] [address]  CBC with Differential/Platelet     Status: None   Collection Time: 02/05/19  2:16 AM  Result Value Ref Range   WBC 9.1 4.0 - 10.5 K/uL   RBC 4.93 4.22 - 5.81 MIL/uL   Hemoglobin 15.6 13.0 - 17.0 g/dL   HCT 92.3 30.0 - 76.2 %   MCV 93.3 80.0 - 100.0 fL   MCH 31.6 26.0 - 34.0 pg   MCHC 33.9 30.0 - 36.0 g/dL   RDW 26.3 33.5 - 45.6 %   Platelets 244 150 - 400 K/uL   nRBC 0.0 0.0 - 0.2 %   Neutrophils Relative % 71 %   Neutro Abs 6.5 1.7 - 7.7 K/uL   Lymphocytes Relative 23 %   Lymphs Abs 2.1 0.7 - 4.0 K/uL   Monocytes Relative 6 %   Monocytes Absolute 0.5 0.1 - 1.0 K/uL   Eosinophils Relative 0 %   Eosinophils Absolute 0.0 0.0 - 0.5 K/uL   Basophils Relative 0 %   Basophils Absolute 0.0 0.0 - 0.1 K/uL   Immature Granulocytes 0 %   Abs Immature Granulocytes 0.03 0.00 - 0.07 K/uL    Comment: Performed at  [facility], [address] [place], [place] [address]   No results found.  Pending Labs Unresulted Labs (From admission, onward)    Start     Ordered   02/05/19 0436  SARS CORONAVIRUS 2 (TAT 6-24 HRS) Nasopharyngeal Nasopharyngeal Swab  (Asymptomatic/Tier 2 Patients Labs)  Once,   STAT    Question Answer Comment  Is this test for diagnosis or screening Screening   Symptomatic for COVID-19 as defined by CDC No   Hospitalized for COVID-19 No   Admitted to ICU for COVID-19 No   Previously tested for COVID-19 Yes   Resident in a congregate (group) care setting Unknown   Employed in healthcare setting No      02/05/19 0435   02/05/19 0216  Rapid urine drug screen (hospital performed)  ONCE - STAT,   STAT     02/05/19 0216   Signed and Held  Magnesium  Add-on,   R     Signed and Held   Signed and Held  Phosphorus  Add-on,   R     Signed and Held   Signed and Held  Creatinine, serum  (enoxaparin (LOVENOX)    CrCl >/= 30 ml/min)  Weekly,   R    Comments: while on enoxaparin therapy    Signed and Held   Signed and Held  Comprehensive metabolic panel  Tomorrow morning,   R     Signed and Held   Signed and Held  CBC  Tomorrow morning,   R     Signed and Held          Vitals/Pain Today's Vitals   02/05/19 0709 02/05/19 0824 02/05/19 1100 02/05/19 1207  BP: (!) 120/91  (!) 119/97 (!) 128/96  Pulse: 88  89 (!) 106  Resp: (!) 21  (!) 22 20  Temp:      TempSrc:      SpO2: 98%  98% 96%  Weight:      Height:      PainSc: Asleep Asleep      Isolation Precautions No active isolations  Medications Medications  LORazepam (ATIVAN) injection 0-4 mg (2 mg Intravenous Given 02/05/19 0931)    Or  LORazepam (ATIVAN) tablet 0-4 mg (  Oral See Alternative 02/05/19 0931)  LORazepam (ATIVAN) tablet 1-4 mg (has no administration in time range)    Or  LORazepam (ATIVAN) injection 1-4 mg (has no administration in time range)  thiamine (VITAMIN B-1) tablet 100 mg (has no  administration in time range)    Or  thiamine (B-1) injection 100 mg (has no administration in time range)  0.9 %  sodium chloride infusion (has no administration in time range)  acetaminophen (TYLENOL) tablet 650 mg (650 mg Oral Given 02/05/19 1050)    Or  acetaminophen (TYLENOL) suppository 650 mg ( Rectal See Alternative 02/05/19 1050)  ondansetron (ZOFRAN) tablet 4 mg (has no administration in time range)    Or  ondansetron (ZOFRAN) injection 4 mg (has no administration in time range)  LORazepam (ATIVAN) injection 0-4 mg (has no administration in time range)    Followed by  LORazepam (ATIVAN) injection 0-4 mg (has no administration in time range)  sodium chloride 0.9 % 1,000 mL with thiamine 076 mg, folic acid 1 mg, multivitamins adult 10 mL infusion ( Intravenous Stopped 02/05/19 0711)  ondansetron (ZOFRAN) injection 4 mg (4 mg Intravenous Given 02/05/19 0235)  LORazepam (ATIVAN) injection 2 mg (2 mg Intravenous Given 02/05/19 0249)  chlordiazePOXIDE (LIBRIUM) capsule 50 mg (50 mg Oral Given 02/05/19 1100)    Mobility walks

## 2019-02-05 NOTE — ED Notes (Signed)
Pt made aware of need for urine sample.  Pt asking when is the next time he can receive dose of ativan, as he is still shaking.  This RN informed him that next dose is not schedule for some time, but that I will be monitoring his condition. Pt agreeable to this response. Pt calm and cooperative at this time.  Pt reports headache is better, denies nausea, only complaint is the tremors.  Will continue to monitor.

## 2019-02-05 NOTE — Progress Notes (Signed)
Patient ID: Jermaine [name], male   DOB: 29-Sep-1965, 53 y.o.   MRN: [id number]                                                                PROGRESS NOTE                                                                                                                                                                                                             Patient Demographics:    Jermaine [name], is a 53 y.o. male, DOB - 29-Sep-1965, NWG:[id number]  Admit date - 02/05/2019   Admitting Physician Briscoe Deutscherimothy S Opyd, MD  Outpatient Primary MD for the patient is [name], [name], MD  LOS - 0  Outpatient Specialists:     Chief Complaint  Patient presents with  . Alcohol Withdrawal       Brief Narrative  53 y.o. male with medical history significant for alcohol abuse, now presenting to emergency department for evaluation of nausea, tremulousness, and malaise.  Patient reports that he had been drinking increased amount of alcohol recently, roughly 20 drinks daily, reports his last drink to been around 1500 on 02/04/2019.  He has developed tremor, nausea, and general malaise, reports experiencing similar symptoms previously when he stopped drinking alcohol, reports a history of delirium tremens, was concern for impending DTs, and therefore presents to the ED for help.  History is somewhat limited by patient's sedation after receiving Ativan in the ED, and he is unable to identify any trigger for his recent heavy drinking.  He is unable to say how long he maintain sobriety after his admission for alcohol withdrawal in June 2020, but does note that he had abstained for 2 years previously.  ED Course: Upon arrival to the ED, patient is found to be afebrile, saturating 98% on room air, slightly tachycardic, and with systolic blood pressure 116.  Chemistry panel features a bicarbonate of 21 and AST of 51.  CBC is unremarkable.  Ethanol level is elevated to 86.  Patient was given IV fluids, Zofran, and Ativan in the  emergency department, and hospitalists are consulted for admission.    Subjective:    Jermaine [name] today is feeling less tremulous.  Pt states he was concerned about etoh withdrawal and therefore presented to ED. Pt initially tachy  but this has improved.  Afebrile over nite.  Pt denies cp, palp, sob, n/v, abd pain, diarrhea, brbpr,      Assessment  & Plan :    Principal Problem:   Alcohol withdrawal (HCC)   1. Alcohol abuse with withdrawal  - Presents with concern for alcohol withdrawal, reports hx of delirium tremens, reports last drink ~1500 on 9/9, and has CIWA scores of 24 & 27 in ED despite alcohol level of 86  - Patient is at high-risk for life-threatening complications and will managed in stepdown unit initially  - Continue to monitor with CIWA, continue Ativan, supplement vitamins, monitor electrolytes, consult with social work   Librium 50mg  po qday x1 days then 25mg  po qday x1 day Pt encouraged to stop etoh intake.   PPE: Mask, face shield  DVT prophylaxis: Lovenox  Code Status: Full  Family Communication: Discussed with patient  Consults called: None  Admission status: Observation      Lab Results  Component Value Date   PLT 244 02/05/2019    Antibiotics  :  none  Anti-infectives (From admission, onward)   None        Objective:   Vitals:   02/05/19 0302 02/05/19 0507 02/05/19 0608 02/05/19 0709  BP: 116/85 (!) 118/95 (!) 113/91 (!) 120/91  Pulse: 95 98 89 88  Resp: 18 18 20  (!) 21  Temp: 99.8 F (37.7 C) 98.4 F (36.9 C) 98.8 F (37.1 C)   TempSrc: Oral Oral Oral   SpO2: 95% 98% 98% 98%  Weight:      Height:        Wt Readings from Last 3 Encounters:  02/05/19 86.2 kg  11/23/18 80.4 kg  11/16/18 77.7 kg     Intake/Output Summary (Last 24 hours) at 02/05/2019 1054 Last data filed at 02/05/2019 0711 Gross per 24 hour  Intake 1000 ml  Output -  Net 1000 ml     Physical Exam  Awake Alert, Oriented X 3, No new F.N deficits, Normal  affect [place].AT,PERRAL Supple Neck,No JVD, No cervical lymphadenopathy appriciated.  Symmetrical Chest wall movement, Good air movement bilaterally, CTAB RRR,No Gallops,Rubs or new Murmurs, No Parasternal Heave +ve B.Sounds, Abd Soft, No tenderness, No organomegaly appriciated, No rebound - guarding or rigidity. No Cyanosis, Clubbing or edema, No new Rash or bruise      Data Review:    CBC Recent Labs  Lab 02/05/19 0216  WBC 9.1  HGB 15.6  HCT 46.0  PLT 244  MCV 93.3  MCH 31.6  MCHC 33.9  RDW 13.3  LYMPHSABS 2.1  MONOABS 0.5  EOSABS 0.0  BASOSABS 0.0    Chemistries  Recent Labs  Lab 02/05/19 0216  NA 141  K 4.8  CL 101  CO2 21*  GLUCOSE 94  BUN 19  CREATININE 1.10  CALCIUM 8.7*  AST 51*  ALT 32  ALKPHOS 38  BILITOT 1.0   ------------------------------------------------------------------------------------------------------------------ No results for input(s): CHOL, HDL, LDLCALC, TRIG, CHOLHDL, LDLDIRECT in the last 72 hours.  No results found for: HGBA1C ------------------------------------------------------------------------------------------------------------------ No results for input(s): TSH, T4TOTAL, T3FREE, THYROIDAB in the last 72 hours.  Invalid input(s): FREET3 ------------------------------------------------------------------------------------------------------------------ No results for input(s): VITAMINB12, FOLATE, FERRITIN, TIBC, IRON, RETICCTPCT in the last 72 hours.  Coagulation profile No results for input(s): INR, PROTIME in the last 168 hours.  No results for input(s): DDIMER in the last 72 hours.  Cardiac Enzymes No results for input(s): CKMB, TROPONINI, MYOGLOBIN in the last 168 hours.  Invalid input(s):  CK ------------------------------------------------------------------------------------------------------------------ No results found for: BNP  Inpatient Medications  Scheduled Meds: . chlordiazePOXIDE  50 mg Oral Once  .  LORazepam  0-4 mg Intravenous Q6H   Or  . LORazepam  0-4 mg Oral Q6H   Continuous Infusions: PRN Meds:.acetaminophen **OR** acetaminophen  Micro Results No results found for this or any previous visit (from the past 240 hour(s)).  Radiology Reports No results found.  Time Spent in minutes  30   Jani Gravel M.D on 02/05/2019 at 10:54 AM  Between 7am to 7pm - Pager - [phone]  After 7pm go to [email or web address] - password [facility]  [facility] -  Office  [phone]

## 2019-02-05 NOTE — H&P (Signed)
History and Physical    Jermaine [name] EAV:[id number] DOB: 12/26/1965 DOA: 02/05/2019  PCP: [name], [name], MD   Patient coming from: Home   Chief Complaint: Alcohol withdrawal   HPI: Jermaine MoanMichael Mcnulty is a 53 y.o. male with medical history significant for alcohol abuse, now presenting to emergency department for evaluation of nausea, tremulousness, and malaise.  Patient reports that he had been drinking increased amount of alcohol recently, roughly 20 drinks daily, reports his last drink to been around 1500 on 02/04/2019.  He has developed tremor, nausea, and general malaise, reports experiencing similar symptoms previously when he stopped drinking alcohol, reports a history of delirium tremens, was concern for impending DTs, and therefore presents to the ED for help.  History is somewhat limited by patient's sedation after receiving Ativan in the ED, and he is unable to identify any trigger for his recent heavy drinking.  He is unable to say how long he maintain sobriety after his admission for alcohol withdrawal in June 2020, but does note that he had abstained for 2 years previously.  ED Course: Upon arrival to the ED, patient is found to be afebrile, saturating 98% on room air, slightly tachycardic, and with systolic blood pressure 116.  Chemistry panel features a bicarbonate of 21 and AST of 51.  CBC is unremarkable.  Ethanol level is elevated to 86.  Patient was given IV fluids, Zofran, and Ativan in the emergency department, and hospitalists are consulted for admission.  Review of Systems:  All other systems reviewed and apart from HPI, are negative.  Past Medical History:  Diagnosis Date  . Alcohol abuse   . Anxiety   . Depression     Past Surgical History:  Procedure Laterality Date  . HERNIA REPAIR       reports that he has never smoked. He has never used smokeless tobacco. He reports current alcohol use of about 20.0 standard drinks of alcohol per week. He reports previous drug use.   No Known Allergies  History reviewed. No pertinent family history.   Prior to Admission medications   Medication Sig Start Date End Date Taking? Authorizing Provider  folic acid (FOLVITE) 1 MG tablet Take 1 tablet (1 mg total) by mouth daily. Patient not taking: Reported on 02/05/2019 11/24/18   Rodolph Bonghompson, Daniel V, MD  hydrOXYzine (ATARAX/VISTARIL) 25 MG tablet Take 1 tablet (25 mg total) by mouth every 6 (six) hours as needed for anxiety. Patient not taking: Reported on 02/05/2019 11/23/18   Rodolph Bonghompson, Daniel V, MD  Multiple Vitamin (MULTIVITAMIN WITH MINERALS) TABS tablet Take 1 tablet by mouth daily. Patient not taking: Reported on 02/05/2019 11/24/18   Rodolph Bonghompson, Daniel V, MD  thiamine 100 MG tablet Take 1 tablet (100 mg total) by mouth daily. Patient not taking: Reported on 02/05/2019 11/24/18   Rodolph Bonghompson, Daniel V, MD    Physical Exam: Vitals:   02/05/19 0155 02/05/19 0157 02/05/19 0302  BP: (!) 147/94  116/85  Pulse: (!) 103  95  Resp: 18  18  Temp: 99.3 F (37.4 C)  99.8 F (37.7 C)  TempSrc: Oral  Oral  SpO2: 98%  95%  Weight:  86.2 kg   Height:  5\' 10"  (1.778 m)     Constitutional: NAD, lethargic  Eyes: PERTLA, lids and conjunctivae normal ENMT: Mucous membranes are moist. Posterior pharynx clear of any exudate or lesions.   Neck: normal, supple, no masses, no thyromegaly Respiratory: no wheezing, no crackles. Normal respiratory effort. No accessory muscle use.  Cardiovascular: S1 &  S2 heard, regular rate and rhythm. Hyperdynamic precordium. No extremity edema.   Abdomen: No distension, no tenderness, soft. Bowel sounds active.  Musculoskeletal: no clubbing / cyanosis. No joint deformity upper and lower extremities.    Skin: no significant rashes, lesions, ulcers. Warm, dry, well-perfused. Neurologic: No gross facial asymmetry. Sensation intact. Moving all extremities.  Psychiatric: lethargic, oriented to person, place, and situation. Cooperative.    Labs on Admission:  I have personally reviewed following labs and imaging studies  CBC: Recent Labs  Lab 02/05/19 0216  WBC 9.1  NEUTROABS 6.5  HGB 15.6  HCT 46.0  MCV 93.3  PLT 546   Basic Metabolic Panel: Recent Labs  Lab 02/05/19 0216  NA 141  K 4.8  CL 101  CO2 21*  GLUCOSE 94  BUN 19  CREATININE 1.10  CALCIUM 8.7*   GFR: Estimated Creatinine Clearance: 80.2 mL/min (by C-G formula based on SCr of 1.1 mg/dL). Liver Function Tests: Recent Labs  Lab 02/05/19 0216  AST 51*  ALT 32  ALKPHOS 38  BILITOT 1.0  PROT 8.0  ALBUMIN 4.4   No results for input(s): LIPASE, AMYLASE in the last 168 hours. No results for input(s): AMMONIA in the last 168 hours. Coagulation Profile: No results for input(s): INR, PROTIME in the last 168 hours. Cardiac Enzymes: No results for input(s): CKTOTAL, CKMB, CKMBINDEX, TROPONINI in the last 168 hours. BNP (last 3 results) No results for input(s): PROBNP in the last 8760 hours. HbA1C: No results for input(s): HGBA1C in the last 72 hours. CBG: No results for input(s): GLUCAP in the last 168 hours. Lipid Profile: No results for input(s): CHOL, HDL, LDLCALC, TRIG, CHOLHDL, LDLDIRECT in the last 72 hours. Thyroid Function Tests: No results for input(s): TSH, T4TOTAL, FREET4, T3FREE, THYROIDAB in the last 72 hours. Anemia Panel: No results for input(s): VITAMINB12, FOLATE, FERRITIN, TIBC, IRON, RETICCTPCT in the last 72 hours. Urine analysis:    Component Value Date/Time   COLORURINE YELLOW 11/19/2018 0921   APPEARANCEUR CLEAR 11/19/2018 0921   LABSPEC 1.015 11/19/2018 0921   PHURINE 5.0 11/19/2018 0921   GLUCOSEU NEGATIVE 11/19/2018 0921   HGBUR NEGATIVE 11/19/2018 0921   BILIRUBINUR NEGATIVE 11/19/2018 0921   KETONESUR NEGATIVE 11/19/2018 0921   PROTEINUR NEGATIVE 11/19/2018 0921   NITRITE NEGATIVE 11/19/2018 0921   LEUKOCYTESUR NEGATIVE 11/19/2018 0921   Sepsis Labs: @LABRCNTIP (procalcitonin:4,lacticidven:4) )No results found for this or  any previous visit (from the past 240 hour(s)).   Radiological Exams on Admission: No results found.  EKG: Not performed.   Assessment/Plan   1. Alcohol abuse with withdrawal  - Presents with concern for alcohol withdrawal, reports hx of delirium tremens, reports last drink ~1500 on 9/9, and has CIWA scores of 24 & 27 in ED despite alcohol level of 86  - Patient is at high-risk for life-threatening complications and will managed in stepdown unit initially  - Continue to monitor with CIWA, continue Ativan, supplement vitamins, monitor electrolytes, consult with social work     PPE: Mask, face shield  DVT prophylaxis: Lovenox  Code Status: Full  Family Communication: Discussed with patient  Consults called: None  Admission status: Observation     Vianne Bulls, MD [facility] Pager [phone]  If 7PM-7AM, please contact night-coverage [email or web address] Password [facility]  02/05/2019, 4:39 AM

## 2019-02-05 NOTE — ED Provider Notes (Addendum)
[facility] DEPT Provider Note: Lowella DellJ. Lane , MD, FACEP  CSN: [id number] MRN: [id number] ARRIVAL: 02/05/19 at 0117 [facility]   CHIEF COMPLAINT  Alcohol Withdrawal   HISTORY OF PRESENT ILLNESS  02/05/19 2:36 AM Jermaine MoanMichael [name] is a 53 y.o. male with a history of alcohol abuse.  He has been drinking for 5 days straight.  He estimates he has been drinking 20 vodka seltzers ([employer]) daily.  He last drank yesterday afternoon.  He is now having withdrawal symptoms.  He is tremulous, nauseated and is having generalized aching pain.  He rates the pain as an 8 out of 10.  He has gone through withdrawal before and fears he will soon have hallucinations.    Past Medical History:  Diagnosis Date  . Alcohol abuse   . Anxiety   . Depression     Past Surgical History:  Procedure Laterality Date  . HERNIA REPAIR      History reviewed. No pertinent family history.  Social History   Tobacco Use  . Smoking status: Never Smoker  . Smokeless tobacco: Never Used  Substance Use Topics  . Alcohol use: Yes    Alcohol/week: 20.0 standard drinks    Types: 20 Standard drinks or equivalent per week    Comment: vodka seltzer  . Drug use: Not Currently    Prior to Admission medications   Medication Sig Start Date End Date Taking? Authorizing Provider  folic acid (FOLVITE) 1 MG tablet Take 1 tablet (1 mg total) by mouth daily. Patient not taking: Reported on 02/05/2019 11/24/18   Rodolph Bonghompson, Daniel V, MD  hydrOXYzine (ATARAX/VISTARIL) 25 MG tablet Take 1 tablet (25 mg total) by mouth every 6 (six) hours as needed for anxiety. Patient not taking: Reported on 02/05/2019 11/23/18   Rodolph Bonghompson, Daniel V, MD  Multiple Vitamin (MULTIVITAMIN WITH MINERALS) TABS tablet Take 1 tablet by mouth daily. Patient not taking: Reported on 02/05/2019 11/24/18   Rodolph Bonghompson, Daniel V, MD  thiamine 100 MG tablet Take 1 tablet (100 mg total) by mouth daily. Patient not taking: Reported on 02/05/2019 11/24/18    Rodolph Bonghompson, Daniel V, MD    Allergies Patient has no known allergies.   REVIEW OF SYSTEMS  Negative except as noted here or in the History of Present Illness.   PHYSICAL EXAMINATION  Initial Vital Signs Blood pressure (!) 147/94, pulse (!) 103, temperature 99.3 F (37.4 C), temperature source Oral, resp. rate 18, height 5\' 10"  (1.778 m), weight 86.2 kg, SpO2 98 %.  Examination General: Well-developed, well-nourished male in no acute distress; appearance consistent with age of record HENT: normocephalic; atraumatic Eyes: pupils equal, round and reactive to light; extraocular muscles intact Neck: supple Heart: regular rate and rhythm Lungs: clear to auscultation bilaterally Abdomen: soft; nondistended; right upper quadrant tenderness; bowel sounds present Extremities: No deformity; full range of motion; pulses normal Neurologic: Awake, alert and oriented; motor function intact in all extremities and symmetric; no facial droop; tremulous Skin: Warm and dry Psychiatric: Anxious; grimacing   RESULTS  Summary of this visit's results, reviewed by myself:   EKG Interpretation  Date/Time:    Ventricular Rate:    PR Interval:    QRS Duration:   QT Interval:    QTC Calculation:   R Axis:     Text Interpretation:        Laboratory Studies: Results for orders placed or performed during the hospital encounter of 02/05/19 (from the past 24 hour(s))  Ethanol     Status: Abnormal  Collection Time: 02/05/19  2:16 AM  Result Value Ref Range   Alcohol, Ethyl (B) 86 (H) <10 mg/dL  Comprehensive metabolic panel     Status: Abnormal   Collection Time: 02/05/19  2:16 AM  Result Value Ref Range   Sodium 141 135 - 145 mmol/L   Potassium 4.8 3.5 - 5.1 mmol/L   Chloride 101 98 - 111 mmol/L   CO2 21 (L) 22 - 32 mmol/L   Glucose, Bld 94 70 - 99 mg/dL   BUN 19 6 - 20 mg/dL   Creatinine, Ser 1.10 0.61 - 1.24 mg/dL   Calcium 8.7 (L) 8.9 - 10.3 mg/dL   Total Protein 8.0 6.5 - 8.1 g/dL    Albumin 4.4 3.5 - 5.0 g/dL   AST 51 (H) 15 - 41 U/L   ALT 32 0 - 44 U/L   Alkaline Phosphatase 38 38 - 126 U/L   Total Bilirubin 1.0 0.3 - 1.2 mg/dL   GFR calc non Af Amer >60 >60 mL/min   GFR calc Af Amer >60 >60 mL/min   Anion gap 19 (H) 5 - 15  CBC with Differential/Platelet     Status: None   Collection Time: 02/05/19  2:16 AM  Result Value Ref Range   WBC 9.1 4.0 - 10.5 K/uL   RBC 4.93 4.22 - 5.81 MIL/uL   Hemoglobin 15.6 13.0 - 17.0 g/dL   HCT 46.0 39.0 - 52.0 %   MCV 93.3 80.0 - 100.0 fL   MCH 31.6 26.0 - 34.0 pg   MCHC 33.9 30.0 - 36.0 g/dL   RDW 13.3 11.5 - 15.5 %   Platelets 244 150 - 400 K/uL   nRBC 0.0 0.0 - 0.2 %   Neutrophils Relative % 71 %   Neutro Abs 6.5 1.7 - 7.7 K/uL   Lymphocytes Relative 23 %   Lymphs Abs 2.1 0.7 - 4.0 K/uL   Monocytes Relative 6 %   Monocytes Absolute 0.5 0.1 - 1.0 K/uL   Eosinophils Relative 0 %   Eosinophils Absolute 0.0 0.0 - 0.5 K/uL   Basophils Relative 0 %   Basophils Absolute 0.0 0.0 - 0.1 K/uL   Immature Granulocytes 0 %   Abs Immature Granulocytes 0.03 0.00 - 0.07 K/uL   Imaging Studies: No results found.  ED COURSE and MDM  Nursing notes and initial vitals signs, including pulse oximetry, reviewed.  Vitals:   02/05/19 0155 02/05/19 0157 02/05/19 0302  BP: (!) 147/94  116/85  Pulse: (!) 103  95  Resp: 18  18  Temp: 99.3 F (37.4 C)  99.8 F (37.7 C)  TempSrc: Oral  Oral  SpO2: 98%  95%  Weight:  86.2 kg   Height:  5\' 10"  (1.778 m)    2:50 AM Banana bag started, CIWA protocol initiated.  4:26 AM Patient sleeping comfortably after IV Ativan.  Will have admitted for alcohol withdrawal.  PROCEDURES    ED DIAGNOSES     ICD-10-CM   1. Alcohol withdrawal syndrome without complication ([place])  W09.811        , Jenny Reichmann, MD 02/05/19 0402    Shanon Rosser, MD 02/05/19 [phone]

## 2019-02-06 ENCOUNTER — Encounter (HOSPITAL_COMMUNITY): Payer: Self-pay

## 2019-02-06 ENCOUNTER — Emergency Department (HOSPITAL_COMMUNITY)
Admission: EM | Admit: 2019-02-06 | Discharge: 2019-02-06 | Disposition: A | Discharge status: Home / Self Care | Payer: BC Managed Care – PPO | Source: Home / Self Care

## 2019-02-06 ENCOUNTER — Encounter (HOSPITAL_COMMUNITY): Payer: Self-pay | Admitting: *Deleted

## 2019-02-06 ENCOUNTER — Other Ambulatory Visit: Payer: Self-pay

## 2019-02-06 DIAGNOSIS — F10231 Alcohol dependence with withdrawal delirium: Secondary | ICD-10-CM | POA: Diagnosis not present

## 2019-02-06 DIAGNOSIS — R443 Hallucinations, unspecified: Secondary | ICD-10-CM | POA: Insufficient documentation

## 2019-02-06 DIAGNOSIS — F10220 Alcohol dependence with intoxication, uncomplicated: Secondary | ICD-10-CM | POA: Insufficient documentation

## 2019-02-06 DIAGNOSIS — Z79899 Other long term (current) drug therapy: Secondary | ICD-10-CM | POA: Insufficient documentation

## 2019-02-06 LAB — CBC
HCT: 42.7 % (ref 39.0–52.0)
Hemoglobin: 14.1 g/dL (ref 13.0–17.0)
MCH: 32.4 pg (ref 26.0–34.0)
MCHC: 33 g/dL (ref 30.0–36.0)
MCV: 98.2 fL (ref 80.0–100.0)
Platelets: 165 K/uL (ref 150–400)
RBC: 4.35 MIL/uL (ref 4.22–5.81)
RDW: 13.2 % (ref 11.5–15.5)
WBC: 5.8 K/uL (ref 4.0–10.5)
nRBC: 0 % (ref 0.0–0.2)

## 2019-02-06 LAB — COMPREHENSIVE METABOLIC PANEL
ALT: 22 U/L (ref 0–44)
AST: 26 U/L (ref 15–41)
Albumin: 3.9 g/dL (ref 3.5–5.0)
Alkaline Phosphatase: 37 U/L — ABNORMAL LOW (ref 38–126)
Anion gap: 9 (ref 5–15)
BUN: 25 mg/dL — ABNORMAL HIGH (ref 6–20)
CO2: 28 mmol/L (ref 22–32)
Calcium: 9 mg/dL (ref 8.9–10.3)
Chloride: 102 mmol/L (ref 98–111)
Creatinine, Ser: 1.02 mg/dL (ref 0.61–1.24)
GFR calc Af Amer: >60 mL/min (ref >60)
GFR calc non Af Amer: >60 mL/min (ref >60)
Glucose, Bld: 109 mg/dL — ABNORMAL HIGH (ref 70–99)
Potassium: 4 mmol/L (ref 3.5–5.1)
Sodium: 139 mmol/L (ref 135–145)
Total Bilirubin: 1.1 mg/dL (ref 0.3–1.2)
Total Protein: 6.6 g/dL (ref 6.5–8.1)

## 2019-02-06 NOTE — ED Notes (Signed)
Pt stating he is wanting to leave, does not want to wait any longer. Risk discussed with pt and pt still wants to leave.

## 2019-02-06 NOTE — ED Triage Notes (Signed)
Patient c/o anxiety and detox from alcohol. Patient states he has been on a 5 day binge. Patient states he bought a 20 pack of vodka seltzer. Patient states he drank because he was having DT's.

## 2019-02-06 NOTE — ED Triage Notes (Signed)
Pt just left without being seen from the ED and returns because he wants help with his alcohol use.

## 2019-02-06 NOTE — Discharge Instructions (Signed)
To Whom it May Concern:                   Jermaine [name] was admitted to [facility] on 02/05/2019 and discharged on 02/06/2019.  Jermaine [name]  May return to work starting Monday September the 14th, 2020 without restrictions.  For any questions you can call [facility] at [phone].                                                                                                                                                              Sharmon Leyden RN

## 2019-02-07 ENCOUNTER — Emergency Department (HOSPITAL_COMMUNITY)
Admission: EM | Admit: 2019-02-07 | Discharge: 2019-02-07 | Discharge status: Against Medical Advice | Payer: BC Managed Care – PPO | Source: Home / Self Care | Attending: Emergency Medicine | Admitting: Emergency Medicine

## 2019-02-07 DIAGNOSIS — F1020 Alcohol dependence, uncomplicated: Secondary | ICD-10-CM

## 2019-02-07 NOTE — ED Provider Notes (Signed)
[place] DEPT Provider Note  CSN: [id number] Arrival date & time: 02/06/19 2145  Chief Complaint(s) Alcohol Problem  HPI Jermaine [name] is a 53 y.o. male with past medical history of alcohol abuse presents here for assistance with detox.  Patient was admitted recently for withdrawal symptoms.  At that time was offered inpatient these thoughts which she declined.  Endorsed drinking few hours ago.  Reports hallucinations that improved with drinking.  Denies any other physical complaints.  No tremors, diaphoresis, tachycardia.  No chest pain shortness of breath.  No abdominal pain.  No nausea.  No vomiting.  HPI  Past Medical History Past Medical History:  Diagnosis Date  . Alcohol abuse   . Anxiety   . Depression    Patient Active Problem List   Diagnosis Date Noted  . Anxiety and depression 11/20/2018  . Alcohol withdrawal ([place]) 11/19/2018  . Alcoholic cirrhosis of liver without ascites ([place]) 11/19/2018  . Alcohol abuse with intoxication ([place]) 11/16/2018  . Chronic alcohol abuse 11/16/2018  . Elevated liver enzymes 11/16/2018  . Thrombocytopenia ([place]) 11/16/2018  . Acute alcohol intoxication with alcoholism ([place]) 11/16/2018   Home Medication(s) Prior to Admission medications   Medication Sig Start Date End Date Taking? Authorizing Provider  chlordiazePOXIDE (LIBRIUM) 25 MG capsule Take 1 capsule (25 mg total) by mouth daily. 02/06/19  Yes Jani Gravel, MD  folic acid (FOLVITE) 1 MG tablet Take 1 tablet (1 mg total) by mouth daily. 02/06/19   Jani Gravel, MD  hydrOXYzine (ATARAX/VISTARIL) 25 MG tablet Take 1 tablet (25 mg total) by mouth every 6 (six) hours as needed for anxiety. Patient not taking: Reported on 02/05/2019 11/23/18   Eugenie Filler, MD  Multiple Vitamin (MULTIVITAMIN WITH MINERALS) TABS tablet Take 1 tablet by mouth daily. 02/06/19   Jani Gravel, MD  thiamine 100 MG tablet Take 1 tablet (100 mg total) by mouth daily. 02/06/19   Jani Gravel, MD  vitamin B-12 (CYANOCOBALAMIN) 1000 MCG tablet Take 2 tablets (2,000 mcg total) by mouth daily. 02/05/19   Jani Gravel, MD                                                                                                                                    Past Surgical History Past Surgical History:  Procedure Laterality Date  . HERNIA REPAIR     Family History Family History  Adopted: Yes    Social History Social History   Tobacco Use  . Smoking status: Never Smoker  . Smokeless tobacco: Never Used  Substance Use Topics  . Alcohol use: Yes    Alcohol/week: 20.0 standard drinks    Types: 20 Standard drinks or equivalent per week    Comment: vodka seltzer  . Drug use: Not Currently   Allergies Patient has no known allergies.  Review of Systems Review of Systems All other systems are reviewed and are negative for acute change  except as noted in the HPI  Physical Exam Vital Signs  I have reviewed the triage vital signs BP (!) 117/92 (BP Location: Left Arm)   Pulse 75   Temp 97.8 F (36.6 C) (Oral)   Resp 17   SpO2 97%   Physical Exam Vitals signs reviewed.  Constitutional:      General: He is not in acute distress.    Appearance: He is well-developed. He is not diaphoretic.  HENT:     Head: Normocephalic and atraumatic.     Jaw: No trismus.     Right Ear: External ear normal.     Left Ear: External ear normal.     Nose: Nose normal.  Eyes:     General: No scleral icterus.    Conjunctiva/sclera: Conjunctivae normal.  Neck:     Musculoskeletal: Normal range of motion.     Trachea: Phonation normal.  Cardiovascular:     Rate and Rhythm: Normal rate and regular rhythm.  Pulmonary:     Effort: Pulmonary effort is normal. No respiratory distress.     Breath sounds: No stridor.  Abdominal:     General: There is no distension.  Musculoskeletal: Normal range of motion.  Neurological:     Mental Status: He is alert and oriented to person, place, and time.      Gait: Gait is intact.  Psychiatric:        Behavior: Behavior normal.     ED Results and Treatments Labs (all labs ordered are listed, but only abnormal results are displayed) Labs Reviewed - No data to display                                                                                                                       EKG  EKG Interpretation  Date/Time:    Ventricular Rate:    PR Interval:    QRS Duration:   QT Interval:    QTC Calculation:   R Axis:     Text Interpretation:        Radiology No results found.  Pertinent labs & imaging results that were available during my care of the patient were reviewed by me and considered in my medical decision making (see chart for details).  Medications Ordered in ED Medications - No data to display  Procedures Procedures  (including critical care time)  Medical Decision Making / ED Course I have reviewed the nursing notes for this encounter and the patient's prior records (if available in EHR or on provided paperwork).   Jermaine [name] was evaluated in Emergency Department on 02/07/2019 for the symptoms described in the history of present illness. He was evaluated in the context of the global COVID-19 pandemic, which necessitated consideration that the patient might be at risk for infection with the SARS-CoV-2 virus that causes COVID-19. Institutional protocols and algorithms that pertain to the evaluation of patients at risk for COVID-19 are in a state of rapid change based on information released by regulatory bodies including the CDC and federal and state organizations. These policies and algorithms were followed during the patient's care in the ED.  Patient does not appear to be in alcohol withdrawal. clinically sober. Discussed outpatient resources.  Peers support consult.  Patient left  prior to receiving discharge and prescription for Librium taper.  The patient appears reasonably screened and/or stabilized for discharge and I doubt any other medical condition or other [facility] requiring further screening, evaluation, or treatment in the ED at this time prior to discharge.       Final Clinical Impression(s) / ED Diagnoses Final diagnoses:  Alcoholism (HCC)      This chart was dictated using voice recognition software.  Despite best efforts to proofread,  errors can occur which can change the documentation meaning.   Nira Conn, MD 02/07/19 [phone]

## 2019-02-07 NOTE — ED Notes (Signed)
Pt walked out of room stating "this is bullshit, you guys should be ashamed of yourselves. I've been sitting in here shaking and no one cares. Someone should have been in to give me a shot or something. I'm leaving." Pt was asked to sign AMA form, which he did, and was then seen walking out EMS bay.

## 2019-02-07 NOTE — ED Notes (Signed)
Pt banging on triage door and blocking door preventing anyone from going in and out. Pt yelling for a hall bed to lie down. Explained to pt we do not have a bed for him at this time. Pt began yelling and beating on the door. Security to lobby and pt redirected. Pt sitting calmly in lobby.

## 2019-03-18 ENCOUNTER — Other Ambulatory Visit: Payer: Self-pay

## 2019-03-18 ENCOUNTER — Encounter (HOSPITAL_COMMUNITY): Payer: Self-pay | Admitting: Emergency Medicine

## 2019-03-18 ENCOUNTER — Inpatient Hospital Stay (HOSPITAL_COMMUNITY)
Admission: EM | Admit: 2019-03-18 | Discharge: 2019-03-22 | DRG: 897 | Disposition: A | Discharge status: Home / Self Care | Payer: BC Managed Care – PPO | Attending: Internal Medicine | Admitting: Internal Medicine

## 2019-03-18 DIAGNOSIS — R739 Hyperglycemia, unspecified: Secondary | ICD-10-CM | POA: Diagnosis present

## 2019-03-18 DIAGNOSIS — F10239 Alcohol dependence with withdrawal, unspecified: Secondary | ICD-10-CM | POA: Diagnosis present

## 2019-03-18 DIAGNOSIS — E872 Acidosis: Secondary | ICD-10-CM | POA: Diagnosis present

## 2019-03-18 DIAGNOSIS — K122 Cellulitis and abscess of mouth: Secondary | ICD-10-CM | POA: Diagnosis present

## 2019-03-18 DIAGNOSIS — F418 Other specified anxiety disorders: Secondary | ICD-10-CM | POA: Diagnosis present

## 2019-03-18 DIAGNOSIS — F10231 Alcohol dependence with withdrawal delirium: Secondary | ICD-10-CM | POA: Diagnosis present

## 2019-03-18 DIAGNOSIS — Z79899 Other long term (current) drug therapy: Secondary | ICD-10-CM | POA: Diagnosis not present

## 2019-03-18 DIAGNOSIS — Y902 Blood alcohol level of 40-59 mg/100 ml: Secondary | ICD-10-CM | POA: Diagnosis present

## 2019-03-18 DIAGNOSIS — I10 Essential (primary) hypertension: Secondary | ICD-10-CM | POA: Diagnosis present

## 2019-03-18 DIAGNOSIS — E669 Obesity, unspecified: Secondary | ICD-10-CM | POA: Diagnosis present

## 2019-03-18 DIAGNOSIS — F10232 Alcohol dependence with withdrawal with perceptual disturbance: Secondary | ICD-10-CM

## 2019-03-18 DIAGNOSIS — G9341 Metabolic encephalopathy: Secondary | ICD-10-CM | POA: Diagnosis present

## 2019-03-18 DIAGNOSIS — D696 Thrombocytopenia, unspecified: Secondary | ICD-10-CM | POA: Diagnosis present

## 2019-03-18 DIAGNOSIS — F10939 Alcohol use, unspecified with withdrawal, unspecified: Secondary | ICD-10-CM | POA: Diagnosis present

## 2019-03-18 DIAGNOSIS — Z713 Dietary counseling and surveillance: Secondary | ICD-10-CM | POA: Diagnosis not present

## 2019-03-18 DIAGNOSIS — Z6830 Body mass index (BMI) 30.0-30.9, adult: Secondary | ICD-10-CM | POA: Diagnosis not present

## 2019-03-18 DIAGNOSIS — R197 Diarrhea, unspecified: Secondary | ICD-10-CM | POA: Diagnosis present

## 2019-03-18 DIAGNOSIS — Z20828 Contact with and (suspected) exposure to other viral communicable diseases: Secondary | ICD-10-CM | POA: Diagnosis present

## 2019-03-18 LAB — CBC
HCT: 44.6 % (ref 39.0–52.0)
HCT: 41.1 % (ref 39.0–52.0)
Hemoglobin: 14.9 g/dL (ref 13.0–17.0)
Hemoglobin: 13.8 g/dL (ref 13.0–17.0)
MCH: 31.3 pg (ref 26.0–34.0)
MCH: 32.4 pg (ref 26.0–34.0)
MCHC: 33.4 g/dL (ref 30.0–36.0)
MCHC: 33.6 g/dL (ref 30.0–36.0)
MCV: 93.7 fL (ref 80.0–100.0)
MCV: 96.5 fL (ref 80.0–100.0)
Platelets: 248 10*3/uL (ref 150–400)
Platelets: 195 10*3/uL (ref 150–400)
RBC: 4.76 MIL/uL (ref 4.22–5.81)
RBC: 4.26 MIL/uL (ref 4.22–5.81)
RDW: 13.3 % (ref 11.5–15.5)
RDW: 13.7 % (ref 11.5–15.5)
WBC: 9 10*3/uL (ref 4.0–10.5)
WBC: 7.5 10*3/uL (ref 4.0–10.5)
nRBC: 0 % (ref 0.0–0.2)
nRBC: 0 % (ref 0.0–0.2)

## 2019-03-18 LAB — RAPID URINE DRUG SCREEN, HOSP PERFORMED
Amphetamines: NOT DETECTED
Barbiturates: NOT DETECTED
Benzodiazepines: NOT DETECTED
Cocaine: NOT DETECTED
Opiates: NOT DETECTED
Tetrahydrocannabinol: NOT DETECTED

## 2019-03-18 LAB — COMPREHENSIVE METABOLIC PANEL
ALT: 22 U/L (ref 0–44)
AST: 37 U/L (ref 15–41)
Albumin: 4.6 g/dL (ref 3.5–5.0)
Alkaline Phosphatase: 44 U/L (ref 38–126)
Anion gap: 16 — ABNORMAL HIGH (ref 5–15)
BUN: 22 mg/dL — ABNORMAL HIGH (ref 6–20)
CO2: 21 mmol/L — ABNORMAL LOW (ref 22–32)
Calcium: 8.7 mg/dL — ABNORMAL LOW (ref 8.9–10.3)
Chloride: 101 mmol/L (ref 98–111)
Creatinine, Ser: 0.76 mg/dL (ref 0.61–1.24)
GFR calc Af Amer: >60 mL/min (ref >60)
GFR calc non Af Amer: >60 mL/min (ref >60)
Glucose, Bld: 114 mg/dL — ABNORMAL HIGH (ref 70–99)
Potassium: 4.1 mmol/L (ref 3.5–5.1)
Sodium: 138 mmol/L (ref 135–145)
Total Bilirubin: 0.8 mg/dL (ref 0.3–1.2)
Total Protein: 7.7 g/dL (ref 6.5–8.1)

## 2019-03-18 LAB — URINALYSIS, ROUTINE W REFLEX MICROSCOPIC
Bilirubin Urine: NEGATIVE
Glucose, UA: NEGATIVE mg/dL
Hgb urine dipstick: NEGATIVE
Ketones, ur: NEGATIVE mg/dL
Leukocytes,Ua: NEGATIVE
Nitrite: NEGATIVE
Protein, ur: NEGATIVE mg/dL
Specific Gravity, Urine: 1.02 (ref 1.005–1.030)
pH: 7 (ref 5.0–8.0)

## 2019-03-18 LAB — SARS CORONAVIRUS 2 (TAT 6-24 HRS): SARS Coronavirus 2: NEGATIVE

## 2019-03-18 LAB — BASIC METABOLIC PANEL
Anion gap: 9 (ref 5–15)
BUN: 15 mg/dL (ref 6–20)
CO2: 28 mmol/L (ref 22–32)
Calcium: 7.9 mg/dL — ABNORMAL LOW (ref 8.9–10.3)
Chloride: 103 mmol/L (ref 98–111)
Creatinine, Ser: 0.82 mg/dL (ref 0.61–1.24)
GFR calc Af Amer: >60 mL/min (ref >60)
GFR calc non Af Amer: >60 mL/min (ref >60)
Glucose, Bld: 94 mg/dL (ref 70–99)
Potassium: 3.6 mmol/L (ref 3.5–5.1)
Sodium: 140 mmol/L (ref 135–145)

## 2019-03-18 LAB — ETHANOL: Alcohol, Ethyl (B): 50 mg/dL — ABNORMAL HIGH (ref <10)

## 2019-03-18 MED ORDER — DEXTROSE-NACL 5-0.45 % IV SOLN
INTRAVENOUS | Status: DC
  Administered 2019-03-18: 09:00:00 via INTRAVENOUS

## 2019-03-18 MED ORDER — THIAMINE HCL 100 MG/ML IJ SOLN
100.0000 mg | Freq: Every day | INTRAMUSCULAR | Status: DC
  Administered 2019-03-18: 100 mg via INTRAVENOUS
  Filled 2019-03-18: qty 2

## 2019-03-18 MED ORDER — ONDANSETRON HCL 4 MG/2ML IJ SOLN
4.0000 mg | Freq: Four times a day (QID) | INTRAMUSCULAR | Status: DC | PRN
  Filled 2019-03-18: qty 2

## 2019-03-18 MED ORDER — METOPROLOL TARTRATE 5 MG/5ML IV SOLN
5.0000 mg | Freq: Four times a day (QID) | INTRAVENOUS | Status: DC | PRN
  Filled 2019-03-18: qty 5

## 2019-03-18 MED ORDER — VITAMIN B-1 100 MG PO TABS: 100.0000 mg | ORAL_TABLET | Freq: Every day | ORAL | Status: DC

## 2019-03-18 MED ORDER — ENOXAPARIN SODIUM 40 MG/0.4ML ~~LOC~~ SOLN
40.0000 mg | SUBCUTANEOUS | Status: DC
  Administered 2019-03-18 – 2019-03-21 (×4): 40 mg via SUBCUTANEOUS
  Filled 2019-03-18 (×5): qty 0.4

## 2019-03-18 MED ORDER — VITAMIN B-1 100 MG PO TABS
100.0000 mg | ORAL_TABLET | Freq: Every day | ORAL | Status: DC
  Administered 2019-03-20 – 2019-03-22 (×3): 100 mg via ORAL
  Filled 2019-03-18 (×4): qty 1

## 2019-03-18 MED ORDER — ADULT MULTIVITAMIN W/MINERALS CH
1.0000 | ORAL_TABLET | Freq: Every day | ORAL | Status: DC
  Administered 2019-03-18 – 2019-03-22 (×5): 1 via ORAL
  Filled 2019-03-18 (×5): qty 1

## 2019-03-18 MED ORDER — LORAZEPAM 2 MG/ML IJ SOLN
0.0000 mg | Freq: Two times a day (BID) | INTRAMUSCULAR | Status: AC
  Administered 2019-03-18: 2 mg via INTRAVENOUS

## 2019-03-18 MED ORDER — LORAZEPAM 2 MG/ML IJ SOLN: 1.0000 mg | Freq: Once | INTRAMUSCULAR | Status: DC

## 2019-03-18 MED ORDER — THIAMINE HCL 100 MG/ML IJ SOLN
100.0000 mg | Freq: Every day | INTRAMUSCULAR | Status: DC
  Administered 2019-03-19: 100 mg via INTRAVENOUS
  Filled 2019-03-18: qty 2

## 2019-03-18 MED ORDER — LORAZEPAM 1 MG PO TABS
0.0000 mg | ORAL_TABLET | Freq: Four times a day (QID) | ORAL | Status: AC
  Administered 2019-03-19: 4 mg via ORAL
  Filled 2019-03-18: qty 4

## 2019-03-18 MED ORDER — LORAZEPAM 2 MG/ML IJ SOLN
0.0000 mg | Freq: Four times a day (QID) | INTRAMUSCULAR | Status: AC
  Administered 2019-03-18 – 2019-03-19 (×3): 2 mg via INTRAVENOUS
  Administered 2019-03-19: 4 mg via INTRAVENOUS
  Administered 2019-03-20: 2 mg via INTRAVENOUS
  Filled 2019-03-18 (×4): qty 1
  Filled 2019-03-18: qty 2
  Filled 2019-03-18: qty 1

## 2019-03-18 MED ORDER — LORAZEPAM 1 MG PO TABS
1.0000 mg | ORAL_TABLET | ORAL | Status: AC | PRN
  Administered 2019-03-19: 1 mg via ORAL
  Administered 2019-03-20: 4 mg via ORAL
  Administered 2019-03-20 (×2): 2 mg via ORAL
  Filled 2019-03-18: qty 2
  Filled 2019-03-18: qty 1
  Filled 2019-03-18: qty 4
  Filled 2019-03-18: qty 1

## 2019-03-18 MED ORDER — LORAZEPAM 2 MG/ML IJ SOLN
1.0000 mg | INTRAMUSCULAR | Status: AC | PRN
  Administered 2019-03-18: 2 mg via INTRAVENOUS
  Filled 2019-03-18: qty 1

## 2019-03-18 MED ORDER — SODIUM CHLORIDE 0.9 % IV SOLN
INTRAVENOUS | Status: DC
  Administered 2019-03-18 – 2019-03-21 (×7): via INTRAVENOUS

## 2019-03-18 MED ORDER — LORAZEPAM 1 MG PO TABS
0.0000 mg | ORAL_TABLET | Freq: Two times a day (BID) | ORAL | Status: AC
  Administered 2019-03-21 (×3): 1 mg via ORAL
  Filled 2019-03-18: qty 2
  Filled 2019-03-18 (×2): qty 1

## 2019-03-18 MED ORDER — THIAMINE HCL 100 MG/ML IJ SOLN: 100.0000 mg | Freq: Every day | INTRAMUSCULAR | Status: DC

## 2019-03-18 MED ORDER — FOLIC ACID 1 MG PO TABS
1.0000 mg | ORAL_TABLET | Freq: Every day | ORAL | Status: DC
  Administered 2019-03-18 – 2019-03-22 (×5): 1 mg via ORAL
  Filled 2019-03-18 (×5): qty 1

## 2019-03-18 NOTE — Progress Notes (Signed)
CSW spoke with patient via bedside regarding SA resources. Patient reports he has been to treatment before at [facility] 3 years ago. He reports "I have all the tools I need, I just need to apply them." Patient saw peer support back in June and vaguely remembers talking to them. He believes he still has their information. CSW left SA resources with patient and briefly explained the process for admission to some of the facilities. It did not appear patient was fully ready to go back to treatment. No other social work needs stated. Please consult again for further needs.  [place] Circle, LCSW Transitions of Care Department [facility] ED [phone]

## 2019-03-18 NOTE — ED Notes (Signed)
Placed SZ pads.

## 2019-03-18 NOTE — ED Triage Notes (Addendum)
Patient here from home with complaints of alcohol detox, last drink yesterday morning. Hx of same. Tremors noted.

## 2019-03-18 NOTE — ED Notes (Signed)
ED TO INPATIENT HANDOFF REPORT  ED Nurse Name and Phone #: [phone]   S Name/Age/Gender Jermaine MoanMichael [name] 53 y.o. male Room/Bed: WA24/WA24  Code Status   Code Status: Full Code  Home/SNF/Other Home Patient oriented to: self, place, time and situation Is this baseline? Yes   Triage Complete: Triage complete  Chief Complaint detox  Triage Note Patient here from home with complaints of alcohol detox, last drink yesterday morning. Hx of same. Tremors noted.    Allergies No Known Allergies  Level of Care/Admitting Diagnosis ED Disposition    ED Disposition Condition Comment   Admit  [id number]  Level of Care: Med-Surg [16]  Covid Evaluation: Person Under Investigation (PUI)  Diagnosis: Alcohol withdrawal (HCC) [291.81.ICD-9-CM]  Admitting Physician: Alwyn RenMATHEWS, ELIZABETH G [[id number]]  Attending Physician: Alwyn RenMATHEWS, ELIZABETH G [[id number]]  Estimated length of stay: 3 - 4 days  Certification:: I certify this patient will need inpatient services for at least 2 midnights  PT Class (Do Not Modify): Inpatient [101]  PT Acc Code (Do Not Modify): Private [1]       B Medical/Surgery History Past Medical History:  Diagnosis Date  . Alcohol abuse   . Anxiety   . Depression    Past Surgical History:  Procedure Laterality Date  . HERNIA REPAIR       A IV Location/Drains/Wounds Patient Lines/Drains/Airways Status   Active Line/Drains/Airways    Name:   Placement date:   Placement time:   Site:   Days:   Peripheral IV 03/18/19 Left Arm   03/18/19    0712    Arm   less than 1          Intake/Output Last 24 hours No intake or output data in the 24 hours ending 03/18/19 1522  Labs/Imaging Results for orders placed or performed during the hospital encounter of 03/18/19 (from the past 48 hour(s))  Comprehensive metabolic panel     Status: Abnormal   Collection Time: 03/18/19  6:58 AM  Result Value Ref Range   Sodium 138 135 -  145 mmol/L   Potassium 4.1 3.5 - 5.1 mmol/L   Chloride 101 98 - 111 mmol/L   CO2 21 (L) 22 - 32 mmol/L   Glucose, Bld 114 (H) 70 - 99 mg/dL   BUN 22 (H) 6 - 20 mg/dL   Creatinine, Ser 3.240.76 0.61 - 1.24 mg/dL   Calcium 8.7 (L) 8.9 - 10.3 mg/dL   Total Protein 7.7 6.5 - 8.1 g/dL   Albumin 4.6 3.5 - 5.0 g/dL   AST 37 15 - 41 U/L   ALT 22 0 - 44 U/L   Alkaline Phosphatase 44 38 - 126 U/L   Total Bilirubin 0.8 0.3 - 1.2 mg/dL   GFR calc non Af Amer >60 >60 mL/min   GFR calc Af Amer >60 >60 mL/min   Anion gap 16 (H) 5 - 15    Comment: Performed at [facility], [address] [place], [place] [address]  Ethanol     Status: Abnormal   Collection Time: 03/18/19  6:58 AM  Result Value Ref Range   Alcohol, Ethyl (B) 50 (H) <10 mg/dL    Comment: (NOTE) Lowest detectable limit for serum alcohol is 10 mg/dL. For medical purposes only. Performed at [facility], [address] [place], [place] [address]   cbc     Status: None   Collection Time: 03/18/19  6:58 AM  Result Value Ref Range  WBC 9.0 4.0 - 10.5 K/uL   RBC 4.76 4.22 - 5.81 MIL/uL   Hemoglobin 14.9 13.0 - 17.0 g/dL   HCT 76.7 34.1 - 93.7 %   MCV 93.7 80.0 - 100.0 fL   MCH 31.3 26.0 - 34.0 pg   MCHC 33.4 30.0 - 36.0 g/dL   RDW 90.2 40.9 - 73.5 %   Platelets 248 150 - 400 K/uL   nRBC 0.0 0.0 - 0.2 %    Comment: Performed at [facility], [address], [place], [place] [address]  Rapid urine drug screen (hospital performed)     Status: None   Collection Time: 03/18/19 11:48 AM  Result Value Ref Range   Opiates NONE DETECTED NONE DETECTED   Cocaine NONE DETECTED NONE DETECTED   Benzodiazepines NONE DETECTED NONE DETECTED   Amphetamines NONE DETECTED NONE DETECTED   Tetrahydrocannabinol NONE DETECTED NONE DETECTED   Barbiturates NONE DETECTED NONE DETECTED    Comment: (NOTE) DRUG SCREEN FOR MEDICAL PURPOSES ONLY.  IF CONFIRMATION IS NEEDED FOR ANY PURPOSE, NOTIFY  LAB WITHIN 5 DAYS. LOWEST DETECTABLE LIMITS FOR URINE DRUG SCREEN Drug Class                     Cutoff (ng/mL) Amphetamine and metabolites    1000 Barbiturate and metabolites    200 Benzodiazepine                 200 Tricyclics and metabolites     300 Opiates and metabolites        300 Cocaine and metabolites        300 THC                            50 Performed at [facility], [address] [place], [place] [address]   Urinalysis, Routine w reflex microscopic     Status: None   Collection Time: 03/18/19 11:48 AM  Result Value Ref Range   Color, Urine YELLOW YELLOW   APPearance CLEAR CLEAR   Specific Gravity, Urine 1.020 1.005 - 1.030   pH 7.0 5.0 - 8.0   Glucose, UA NEGATIVE NEGATIVE mg/dL   Hgb urine dipstick NEGATIVE NEGATIVE   Bilirubin Urine NEGATIVE NEGATIVE   Ketones, ur NEGATIVE NEGATIVE mg/dL   Protein, ur NEGATIVE NEGATIVE mg/dL   Nitrite NEGATIVE NEGATIVE   Leukocytes,Ua NEGATIVE NEGATIVE    Comment: Microscopic not done on urines with negative protein, blood, leukocytes, nitrite, or glucose < 500 mg/dL. Performed at [facility], [address] [place], [place] [address]   CBC     Status: None   Collection Time: 03/18/19  2:30 PM  Result Value Ref Range   WBC 7.5 4.0 - 10.5 K/uL   RBC 4.26 4.22 - 5.81 MIL/uL   Hemoglobin 13.8 13.0 - 17.0 g/dL   HCT 22.9 79.8 - 92.1 %   MCV 96.5 80.0 - 100.0 fL   MCH 32.4 26.0 - 34.0 pg   MCHC 33.6 30.0 - 36.0 g/dL   RDW 19.4 17.4 - 08.1 %   Platelets 195 150 - 400 K/uL   nRBC 0.0 0.0 - 0.2 %    Comment: Performed at [facility], [address] [place], [place] [address]   No results found.  Pending Labs Unresulted Labs (From admission, onward)    Start     Ordered   03/19/19 0500  CBC  Tomorrow  morning,   R     03/18/19 0856   03/19/19 0500  Comprehensive metabolic panel  Tomorrow morning,   R     03/18/19 0856   03/19/19 0500  Magnesium  Tomorrow  morning,   R     03/18/19 0856   03/19/19 0500  Phosphorus  Tomorrow morning,   R     03/18/19 0856   03/18/19 8144  Basic metabolic panel  Once,   STAT     03/18/19 0857   03/18/19 0802  SARS CORONAVIRUS 2 (TAT 6-24 HRS) Nasopharyngeal Nasopharyngeal Swab  (Asymptomatic/Tier 2 Patients Labs)  Once,   STAT    Question Answer Comment  Is this test for diagnosis or screening Screening   Symptomatic for COVID-19 as defined by CDC No   Hospitalized for COVID-19 No   Admitted to ICU for COVID-19 No   Previously tested for COVID-19 Yes   Resident in a congregate (group) care setting No   Employed in healthcare setting No      03/18/19 0801          Vitals/Pain Today's Vitals   03/18/19 1330 03/18/19 1356 03/18/19 1430 03/18/19 1510  BP: (!) 149/84 (!) 149/84 124/82   Pulse: 78 76 85 67  Resp: 17 19 20    Temp:      TempSrc:      SpO2: 92% 96% 96% 99%  PainSc:        Isolation Precautions No active isolations  Medications Medications  LORazepam (ATIVAN) injection 0-4 mg (2 mg Intravenous Given 03/18/19 1315)    Or  LORazepam (ATIVAN) tablet 0-4 mg ( Oral See Alternative 03/18/19 1315)  LORazepam (ATIVAN) injection 0-4 mg (has no administration in time range)    Or  LORazepam (ATIVAN) tablet 0-4 mg (has no administration in time range)  LORazepam (ATIVAN) tablet 1-4 mg (has no administration in time range)    Or  LORazepam (ATIVAN) injection 1-4 mg (has no administration in time range)  folic acid (FOLVITE) tablet 1 mg (1 mg Oral Given 03/18/19 1134)  multivitamin with minerals tablet 1 tablet (1 tablet Oral Given 03/18/19 1134)  thiamine (VITAMIN B-1) tablet 100 mg (has no administration in time range)    Or  thiamine (B-1) injection 100 mg (has no administration in time range)  enoxaparin (LOVENOX) injection 40 mg (has no administration in time range)  0.9 %  sodium chloride infusion ( Intravenous New Bag/Given 03/18/19 1134)  metoprolol tartrate (LOPRESSOR) injection  5 mg (has no administration in time range)  ondansetron (ZOFRAN) injection 4 mg (has no administration in time range)    Mobility walks with person assist Low fall risk   Focused Assessments   R Recommendations: See Admitting Provider Note  Report given to:   Additional Notes:

## 2019-03-18 NOTE — ED Notes (Signed)
RN attempted to call report, RN is tied up  in a room with a pt will try again.

## 2019-03-18 NOTE — ED Notes (Signed)
Report called to [place], [place]

## 2019-03-18 NOTE — ED Provider Notes (Signed)
[facility] DEPT Provider Note   CSN: [id number] Arrival date & time: 03/18/19  6712     History   Chief Complaint Chief Complaint  Patient presents with  . Delirium Tremens (DTS)  . Alcohol Problem    HPI Jermaine [name] is a 53 y.o. male with a hx of alcohol abuse, cirrhosis, prior alcohol withdrawal, thrombocytopenia, anxiety, & depression who presents to the ED with concern for EtOH withdrawal that began last night. Patient states he has a hx of alcohol abuse with binge drinking episodes, states he binge drank about 1.5 cases of white claw per day for 5 days straight. Last drink was about 24 hours ago. He states he began feeling badly yesterday with nausea, TNTC episodes of emesis, tremors, anxiety, & hallucinations. No alleviating/aggravating factors to sxs. Feels like prior alcohol withdrawal requiring admission. Denies fever, chills, hematemesis, chest pain, abdominal pain, melena, or seizure activity. States he has hx of DTs, denies prior seizure activity. Denies drug use.     HPI  Past Medical History:  Diagnosis Date  . Alcohol abuse   . Anxiety   . Depression     Patient Active Problem List   Diagnosis Date Noted  . Anxiety and depression 11/20/2018  . Alcohol withdrawal ([place]) 11/19/2018  . Alcoholic cirrhosis of liver without ascites ([place]) 11/19/2018  . Alcohol abuse with intoxication ([place]) 11/16/2018  . Chronic alcohol abuse 11/16/2018  . Elevated liver enzymes 11/16/2018  . Thrombocytopenia ([place]) 11/16/2018  . Acute alcohol intoxication with alcoholism ([place]) 11/16/2018    Past Surgical History:  Procedure Laterality Date  . HERNIA REPAIR          Home Medications    Prior to Admission medications   Medication Sig Start Date End Date Taking? Authorizing Provider  chlordiazePOXIDE (LIBRIUM) 25 MG capsule Take 1 capsule (25 mg total) by mouth daily. 02/06/19   Jani Gravel, MD  folic acid (FOLVITE) 1 MG tablet Take 1 tablet (1  mg total) by mouth daily. 02/06/19   Jani Gravel, MD  hydrOXYzine (ATARAX/VISTARIL) 25 MG tablet Take 1 tablet (25 mg total) by mouth every 6 (six) hours as needed for anxiety. Patient not taking: Reported on 02/05/2019 11/23/18   Eugenie Filler, MD  Multiple Vitamin (MULTIVITAMIN WITH MINERALS) TABS tablet Take 1 tablet by mouth daily. 02/06/19   Jani Gravel, MD  thiamine 100 MG tablet Take 1 tablet (100 mg total) by mouth daily. 02/06/19   Jani Gravel, MD  vitamin B-12 (CYANOCOBALAMIN) 1000 MCG tablet Take 2 tablets (2,000 mcg total) by mouth daily. 02/05/19   Jani Gravel, MD    Family History Family History  Adopted: Yes    Social History Social History   Tobacco Use  . Smoking status: Never Smoker  . Smokeless tobacco: Never Used  Substance Use Topics  . Alcohol use: Yes    Alcohol/week: 20.0 standard drinks    Types: 20 Standard drinks or equivalent per week    Comment: vodka seltzer  . Drug use: Not Currently     Allergies   Patient has no known allergies.   Review of Systems Review of Systems  Constitutional: Negative for chills and fever.  Respiratory: Negative for shortness of breath.   Cardiovascular: Negative for chest pain.  Gastrointestinal: Positive for nausea and vomiting. Negative for abdominal pain, blood in stool and diarrhea.  Neurological: Positive for tremors. Negative for seizures and syncope.  Psychiatric/Behavioral: Positive for hallucinations. The patient is nervous/anxious.   All other  systems reviewed and are negative.    Physical Exam Updated Vital Signs BP (!) 159/107 (BP Location: Right Arm)   Pulse 96   Temp 98 F (36.7 C) (Oral)   Resp 19   SpO2 97%   Physical Exam Vitals signs and nursing note reviewed.  Constitutional:      Appearance: He is well-developed. He is ill-appearing. He is not toxic-appearing.  HENT:     Head: Normocephalic and atraumatic.  Eyes:     General:        Right eye: No discharge.        Left eye: No  discharge.     Conjunctiva/sclera: Conjunctivae normal.  Neck:     Musculoskeletal: Neck supple.  Cardiovascular:     Rate and Rhythm: Normal rate and regular rhythm.  Pulmonary:     Effort: Pulmonary effort is normal. No respiratory distress.     Breath sounds: Normal breath sounds. No wheezing, rhonchi or rales.  Abdominal:     Palpations: Abdomen is soft.     Tenderness: There is no abdominal tenderness. There is no guarding or rebound.  Skin:    General: Skin is warm.     Findings: No rash.  Neurological:     Mental Status: He is alert.     Comments: Clear speech.   Psychiatric:        Mood and Affect: Mood is anxious.     Comments: Mildly tremulous.  Does not appear to be actively responding to internal stimuli.     ED Treatments / Results  Labs (all labs ordered are listed, but only abnormal results are displayed) Labs Reviewed  COMPREHENSIVE METABOLIC PANEL - Abnormal; Notable for the following components:      Result Value   CO2 21 (*)    Glucose, Bld 114 (*)    BUN 22 (*)    Calcium 8.7 (*)    Anion gap 16 (*)    All other components within normal limits  ETHANOL - Abnormal; Notable for the following components:   Alcohol, Ethyl (B) 50 (*)    All other components within normal limits  SARS CORONAVIRUS 2 (TAT 6-24 HRS)  CBC  RAPID URINE DRUG SCREEN, HOSP PERFORMED  URINALYSIS, ROUTINE W REFLEX MICROSCOPIC    EKG None  Radiology No results found.  Procedures Procedures (including critical care time)  Medications Ordered in ED Medications  LORazepam (ATIVAN) injection 0-4 mg (2 mg Intravenous Given 03/18/19 0728)    Or  LORazepam (ATIVAN) tablet 0-4 mg ( Oral See Alternative 03/18/19 0728)  LORazepam (ATIVAN) injection 0-4 mg (has no administration in time range)    Or  LORazepam (ATIVAN) tablet 0-4 mg (has no administration in time range)  thiamine (B-1) injection 100 mg (100 mg Intravenous Given 03/18/19 0819)  dextrose 5 %-0.45 % sodium chloride  infusion (has no administration in time range)     Initial Impression / Assessment and Plan / ED Course  I have reviewed the triage vital signs and the nursing notes.  Pertinent labs & imaging results that were available during my care of the patient were reviewed by me and considered in my medical decision making (see chart for details).   Patient presents to the ED for EtOH withdrawal, hx of similar requiring prior admissions, last drink about 24 hours PTA per his report. He is mildly ill appearing, BP elevated vitals otherwise WNL. Abdomen without peritoneal signs. CIWA 20- protocol initiated. Ethanol mildly elevated @ 50. Labs w/ mildly  low bicarb & mildly elevated anion gap- feel alcohol ketoacidosis is most likely- UA pending, thiamine ordered, start D5 1/2NS infusion after he has received thiamine which was relayed to nursing staff. Patient feeling improved s/p 2 mg of Ativan, clinically appears improved as well.. Will consult hospitalist service for admission for EtOH withdrawal. COVID swab ordered given pending admission.   08:15: CONSULT: Discussed with hospitalist Dr. Ashley RoyaltyMatthews- accepts admission.   Findings and plan of care discussed with supervising physician Dr. Lynelle DoctorKnapp who is in agreement.   Jermaine [name] was evaluated in Emergency Department on 03/18/2019 for the symptoms described in the history of present illness. He/she was evaluated in the context of the global COVID-19 pandemic, which necessitated consideration that the patient might be at risk for infection with the SARS-CoV-2 virus that causes COVID-19. Institutional protocols and algorithms that pertain to the evaluation of patients at risk for COVID-19 are in a state of rapid change based on information released by regulatory bodies including the CDC and federal and state organizations. These policies and algorithms were followed during the patient's care in the ED.   Final Clinical Impressions(s) / ED Diagnoses   Final  diagnoses:  Alcohol withdrawal syndrome with complication [facility](HCC)    ED Discharge Orders    None       Cherly Andersonetrucelli,  R, PA-C 03/18/19 0846    [name] DibblesKnapp, Jon, MD 03/19/19 [phone]

## 2019-03-18 NOTE — H&P (Addendum)
History and Physical    Arney Mayabb GMW:[id number] DOB: 07-07-1965 DOA: 03/18/2019  PCP: London Pepper, MD Patient coming from: Home  Chief Complaint: alcohol withdrawl  HPI: Jermaine [name] is a 53 y.o. male with medical history significant of alcohol abuse,cirrhosis, multiple hospital admits for the same now coming in with binge drinking case and a half of white- claw  Daily for last 5 days .alcohol level 50.he reports nausea vomitting diarrhea since yesterday.last meal was yesterday.no sick contacts,fever,chills,cough,urinary complaints.has a head ache typical for withdrawal,no changes with vision.no history of seizures. No hematemesis,melena.denies drug use. Has tremors when he stops drinking. C/o hallucinations and tremors similar to prior episodes  RECENT ER VISIT 9/12 Patient left ER before receiving prescription for librium taper.  ED Course: na 138 k 4.1,bun22 cr 0.76 ,LFT normal,wbc 9.0,hb 14.9,platelet 248.alcohol level 50,uds positive for benzos,gap 16,UA pending.  hypertensive in er.no tachycardia, COVID PENDING   Review of Systems: As per HPI otherwise all other systems reviewed and are negative  Ambulatory Status:ambulates without difficulty and does all ADL without assistance  Past Medical History:  Diagnosis Date  . Alcohol abuse   . Anxiety   . Depression     Past Surgical History:  Procedure Laterality Date  . HERNIA REPAIR      Social History   Socioeconomic History  . Marital status: Single    Spouse name: Not on file  . Number of children: Not on file  . Years of education: Not on file  . Highest education level: Not on file  Occupational History  . Not on file  Social Needs  . Financial resource strain: Not on file  . Food insecurity    Worry: Not on file    Inability: Not on file  . Transportation needs    Medical: Not on file    Non-medical: Not on file  Tobacco Use  . Smoking status: Never Smoker  . Smokeless tobacco: Never Used   Substance and Sexual Activity  . Alcohol use: Yes    Alcohol/week: 20.0 standard drinks    Types: 20 Standard drinks or equivalent per week    Comment: vodka seltzer  . Drug use: Not Currently  . Sexual activity: Not on file  Lifestyle  . Physical activity    Days per week: Not on file    Minutes per session: Not on file  . Stress: Not on file  Relationships  . Social Herbalist on phone: Not on file    Gets together: Not on file    Attends religious service: Not on file    Active member of club or organization: Not on file    Attends meetings of clubs or organizations: Not on file    Relationship status: Not on file  . Intimate partner violence    Fear of current or ex partner: Not on file    Emotionally abused: Not on file    Physically abused: Not on file    Forced sexual activity: Not on file  Other Topics Concern  . Not on file  Social History Narrative  . Not on file    No Known Allergies  Family History  Adopted: Yes    Prior to Admission medications   Medication Sig Start Date End Date Taking? Authorizing Provider  escitalopram (LEXAPRO) 20 MG tablet Take 20 mg by mouth daily. 02/27/19  Yes [provider]  hydrOXYzine (ATARAX/VISTARIL) 50 MG tablet Take 25-50 mg by mouth 2 (two) times daily as  needed for anxiety. 02/11/19  Yes [provider]  Multiple Vitamin (MULTIVITAMIN WITH MINERALS) TABS tablet Take 1 tablet by mouth daily. 02/06/19  Yes Pearson GrippeKim, James, MD  chlordiazePOXIDE (LIBRIUM) 25 MG capsule Take 1 capsule (25 mg total) by mouth daily. Patient not taking: Reported on 03/18/2019 02/06/19   Pearson GrippeKim, James, MD  folic acid (FOLVITE) 1 MG tablet Take 1 tablet (1 mg total) by mouth daily. Patient not taking: Reported on 03/18/2019 02/06/19   Pearson GrippeKim, James, MD  hydrOXYzine (ATARAX/VISTARIL) 25 MG tablet Take 1 tablet (25 mg total) by mouth every 6 (six) hours as needed for anxiety. Patient not taking: Reported on 02/05/2019 11/23/18   Rodolph Bonghompson,  Daniel V, MD  thiamine 100 MG tablet Take 1 tablet (100 mg total) by mouth daily. Patient not taking: Reported on 03/18/2019 02/06/19   Pearson GrippeKim, James, MD  vitamin B-12 (CYANOCOBALAMIN) 1000 MCG tablet Take 2 tablets (2,000 mcg total) by mouth daily. Patient not taking: Reported on 03/18/2019 02/05/19   Pearson GrippeKim, James, MD    Physical Exam: He is awake alert and oriented Vitals:   03/18/19 0644 03/18/19 0720 03/18/19 0800  BP: (!) 159/107 (!) 150/103 (!) 139/102  Pulse: 96 79 79  Resp: 19  18  Temp: 98 F (36.7 C)    TempSrc: Oral    SpO2: 97%  93%     . General: Appears chronically sick with tremors . Eyes:  PERRL, EOMI, normal lids, iris . ENT:  grossly normal hearing, lips & tongue, mmm . Neck:  no LAD, masses or thyromegaly . Cardiovascular: RRR, no m/r/g. No LE edema.  Marland Kitchen. Respiratory:  CTA bilaterally, no w/r/r. Normal respiratory effort. . Abdomen:  soft, ntnd, NABS . Skin:  no rash or induration seen on limited exam . Musculoskeletal:  grossly normal tone BUE/BLE, good ROM, no bony abnormality . Psychiatric:  grossly normal mood and affect, speech fluent and appropriate, AOx3 . Neurologic:  CN 2-12 grossly intact, moves all extremities in coordinated fashion, sensation intact.  Asterixis present  Labs on Admission: I have personally reviewed following labs and imaging studies  CBC: Recent Labs  Lab 03/18/19 0658  WBC 9.0  HGB 14.9  HCT 44.6  MCV 93.7  PLT 248   Basic Metabolic Panel: Recent Labs  Lab 03/18/19 0658  NA 138  K 4.1  CL 101  CO2 21*  GLUCOSE 114*  BUN 22*  CREATININE 0.76  CALCIUM 8.7*   GFR: CrCl cannot be calculated (Unknown ideal weight.). Liver Function Tests: Recent Labs  Lab 03/18/19 0658  AST 37  ALT 22  ALKPHOS 44  BILITOT 0.8  PROT 7.7  ALBUMIN 4.6   No results for input(s): LIPASE, AMYLASE in the last 168 hours. No results for input(s): AMMONIA in the last 168 hours. Coagulation Profile: No results for input(s): INR, PROTIME  in the last 168 hours. Cardiac Enzymes: No results for input(s): CKTOTAL, CKMB, CKMBINDEX, TROPONINI in the last 168 hours. BNP (last 3 results) No results for input(s): PROBNP in the last 8760 hours. HbA1C: No results for input(s): HGBA1C in the last 72 hours. CBG: No results for input(s): GLUCAP in the last 168 hours. Lipid Profile: No results for input(s): CHOL, HDL, LDLCALC, TRIG, CHOLHDL, LDLDIRECT in the last 72 hours. Thyroid Function Tests: No results for input(s): TSH, T4TOTAL, FREET4, T3FREE, THYROIDAB in the last 72 hours. Anemia Panel: No results for input(s): VITAMINB12, FOLATE, FERRITIN, TIBC, IRON, RETICCTPCT in the last 72 hours. Urine analysis:    Component Value  Date/Time   COLORURINE YELLOW 11/19/2018 0921   APPEARANCEUR CLEAR 11/19/2018 0921   LABSPEC 1.015 11/19/2018 0921   PHURINE 5.0 11/19/2018 0921   GLUCOSEU NEGATIVE 11/19/2018 0921   HGBUR NEGATIVE 11/19/2018 0921   BILIRUBINUR NEGATIVE 11/19/2018 0921   KETONESUR NEGATIVE 11/19/2018 0921   PROTEINUR NEGATIVE 11/19/2018 0921   NITRITE NEGATIVE 11/19/2018 0921   LEUKOCYTESUR NEGATIVE 11/19/2018 0921    Creatinine Clearance: CrCl cannot be calculated (Unknown ideal weight.).  Sepsis Labs: @LABRCNTIP (procalcitonin:4,lacticidven:4) )No results found for this or any previous visit (from the past 240 hour(s)).   Radiological Exams on Admission: No results found.  Assessment/Plan Active Problems:   * No active hospital problems. *    #1 alcohol abuse/delirium tremens-patient admitted with binge drinking for the last 5 days.  He drank a case and a half of white claw.  He reports he does not drink daily but he when he starts drinking he drinks  continuously for many days. He is hypertensive not tachycardic.  He has tremors.  His initial CIWA scale in the ER was 20. CIWA protocol initiated. Consulted in the ER who already saw the patient it looks like patient already has all the  information he needs to quit drinking alcohol.  #2 gap metabolic acidosis likely secondary to alcohol abuse-recheck tomorrow after saline.  UA pending.  #3 anxiety and depression continue Lexapro.  #4 history of thrombocytopenia - stable watch on lovenox  #5 hypertension secondary to alcohol withdrawal treat with as needed Lopressor.  He does not have a history of hypertension and is not on any medications at home for this.  #6 intractable nausea vomiting secondary to alcohol withdrawal treat symptomatically.  #7 diarrhea started yesterday has had no further diarrhea while in ER monitor closely.  Severity of Illness: The appropriate patient status for this patient is INPATIENT. Inpatient status is judged to be reasonable and necessary in order to provide the required intensity of service to ensure the patient's safety. The patient's presenting symptoms, physical exam findings, and initial radiographic and laboratory data in the context of their chronic comorbidities is felt to place them at high risk for further clinical deterioration. Furthermore, it is not anticipated that the patient will be medically stable for discharge from the hospital within 2 midnights of admission. The following factors support the patient status of inpatient.   " The patient's presenting symptoms include intractable nausea vomiting diarrhea " The worrisome physical exam findings include dry mucous membrane hypertension tremors " The initial radiographic and laboratory data are worrisome because of high gap metabolic acidosis " The chronic co-morbidities include recurrent alcohol abuse delirium tremens anxiety depression   * I certify that at the point of admission it is my clinical judgment that the patient will require inpatient hospital care spanning beyond 2 midnights from the point of admission due to high intensity of service, high risk for further deterioration and high frequency of surveillance required.*     Estimated body mass index is 27.52 kg/m as calculated from the following:   Height as of 02/06/19: 5\' 10"  (1.778 m).   Weight as of 02/06/19: 87 kg.   DVT prophylaxis: Lovenox Code Status: Full code Family Communication: No family present  Disposition Plan: Pending clinical improvement Consults called: None Admission status: Inpatient    MD [facility]  If 7PM-7AM, please contact night-coverage [email or web address] Password [email or web address]  03/18/2019, 8:58 AM

## 2019-03-19 DIAGNOSIS — R739 Hyperglycemia, unspecified: Secondary | ICD-10-CM

## 2019-03-19 DIAGNOSIS — R197 Diarrhea, unspecified: Secondary | ICD-10-CM

## 2019-03-19 LAB — CBC
HCT: 41.5 % (ref 39.0–52.0)
Hemoglobin: 13.4 g/dL (ref 13.0–17.0)
MCH: 31.7 pg (ref 26.0–34.0)
MCHC: 32.3 g/dL (ref 30.0–36.0)
MCV: 98.1 fL (ref 80.0–100.0)
Platelets: 187 10*3/uL (ref 150–400)
RBC: 4.23 MIL/uL (ref 4.22–5.81)
RDW: 13.4 % (ref 11.5–15.5)
WBC: 5.4 10*3/uL (ref 4.0–10.5)
nRBC: 0 % (ref 0.0–0.2)

## 2019-03-19 LAB — COMPREHENSIVE METABOLIC PANEL
ALT: 19 U/L (ref 0–44)
AST: 23 U/L (ref 15–41)
Albumin: 3.3 g/dL — ABNORMAL LOW (ref 3.5–5.0)
Alkaline Phosphatase: 35 U/L — ABNORMAL LOW (ref 38–126)
Anion gap: 9 (ref 5–15)
BUN: 15 mg/dL (ref 6–20)
CO2: 26 mmol/L (ref 22–32)
Calcium: 7.9 mg/dL — ABNORMAL LOW (ref 8.9–10.3)
Chloride: 102 mmol/L (ref 98–111)
Creatinine, Ser: 0.8 mg/dL (ref 0.61–1.24)
GFR calc Af Amer: >60 mL/min (ref >60)
GFR calc non Af Amer: >60 mL/min (ref >60)
Glucose, Bld: 101 mg/dL — ABNORMAL HIGH (ref 70–99)
Potassium: 3.5 mmol/L (ref 3.5–5.1)
Sodium: 137 mmol/L (ref 135–145)
Total Bilirubin: 1.1 mg/dL (ref 0.3–1.2)
Total Protein: 5.8 g/dL — ABNORMAL LOW (ref 6.5–8.1)

## 2019-03-19 LAB — PHOSPHORUS: Phosphorus: 3.5 mg/dL (ref 2.5–4.6)

## 2019-03-19 LAB — MAGNESIUM: Magnesium: 2.2 mg/dL (ref 1.7–2.4)

## 2019-03-19 MED ORDER — KETOROLAC TROMETHAMINE 30 MG/ML IJ SOLN
30.0000 mg | Freq: Once | INTRAMUSCULAR | Status: AC
  Administered 2019-03-19: 30 mg via INTRAVENOUS
  Filled 2019-03-19: qty 1

## 2019-03-19 MED ORDER — HYDROXYZINE HCL 25 MG PO TABS
25.0000 mg | ORAL_TABLET | Freq: Two times a day (BID) | ORAL | Status: DC | PRN
  Administered 2019-03-19 – 2019-03-21 (×2): 50 mg via ORAL
  Filled 2019-03-19 (×2): qty 2

## 2019-03-19 MED ORDER — DIPHENHYDRAMINE HCL 25 MG PO CAPS
25.0000 mg | ORAL_CAPSULE | Freq: Once | ORAL | Status: AC
  Administered 2019-03-19: 25 mg via ORAL
  Filled 2019-03-19: qty 1

## 2019-03-19 MED ORDER — ESCITALOPRAM OXALATE 20 MG PO TABS
20.0000 mg | ORAL_TABLET | Freq: Every day | ORAL | Status: DC
  Administered 2019-03-19 – 2019-03-22 (×4): 20 mg via ORAL
  Filled 2019-03-19 (×4): qty 1

## 2019-03-19 MED ORDER — ACETAMINOPHEN 500 MG PO TABS
1000.0000 mg | ORAL_TABLET | Freq: Once | ORAL | Status: AC
  Administered 2019-03-19: 1000 mg via ORAL
  Filled 2019-03-19: qty 2

## 2019-03-19 NOTE — Progress Notes (Signed)
Patient complaint of painful and swollen uvula, which was red coloration on assessment.  Text paged floor coverage for Benadryl/Tylenol request.

## 2019-03-19 NOTE — Plan of Care (Signed)

## 2019-03-19 NOTE — Progress Notes (Addendum)
PROGRESS NOTE    Kollen Armenti  RXV:[id number] DOB: February 02, 1966 DOA: 03/18/2019 PCP: London Pepper, MD  Brief Narrative:  HPI per Dr. Landis Gandy on 03/18/2019 Adis Sturgill is a 53 y.o. male with medical history significant of alcohol abuse,cirrhosis, multiple hospital admits for the same now coming in with binge drinking case and a half of white- claw  Daily for last 5 days .alcohol level 50.he reports nausea vomitting diarrhea since yesterday.last meal was yesterday.no sick contacts,fever,chills,cough,urinary complaints.has a head ache typical for withdrawal,no changes with vision.no history of seizures. No hematemesis,melena.denies drug use. Has tremors when he stops drinking. C/o hallucinations and tremors similar to prior episodes  RECENT ER VISIT 9/12 Patient left ER before receiving prescription for librium taper.  ED Course: na 138 k 4.1,bun22 cr 0.76 ,LFT normal,wbc 9.0,hb 14.9,platelet 248.alcohol level 50,uds positive for benzos,gap 16,UA pending.  hypertensive in er.no tachycardia, COVID PENDING  **Interim History Continues to have withdrawal symptoms and has tremors.  Started to complain of some throat pain and has inflamed uvula so checking strep PCR and mono screen as well as CMV.  Assessment & Plan:   Active Problems:   Alcohol withdrawal (HCC)  Alcohol Abuse with Alcohol With/Delirium Tremens  -Patient admitted with binge drinking for the last 5 days.  He drank a case and a half of white claw.   -EtOH Level on admission was 50 -He reports he does not drink daily but he when he starts drinking he drinks  continuously for many days. -He is hypertensive not tachycardic and bradycardic.   -Continues to have Tremors.  His initial CIWA scale in the ER was 20. -CIWA protocol initiated with po/IV Lorazepam 0-4 mg IV q6h for 8 Dosesbut may need to Escalate -C/w Folic Acid 1 mg po Daily, MVI+Minerals 1 tab po Daily, and Thiamine 100 mg po/IV Daily -Continue  supportive care with normal saline rate 100 MLS per hour continue with antiemetics with ondansetron -Consulted social worker in the ER who already saw the patient it looks like patient already has all the information he needs to quit drinking alcohol.  Anion Gap Metabolic Acidosis likely secondary to Alcohol Abuse -Anion gap on admission was 16 with a CO2 of 21 and a chloride of 101 -Was given IV fluid hydration with normal saline rate of 100 mL/hr and will continue today -Patient is anion gap is now 9 with a CO2 of 26 and a chloride level of 102  Uvulitis -Patient usually was red and swollen-check strep PCR and CMV and infectious mononucleosis screen -Given a one-time dose of IV Toradol for pain and will continue Benadryl and acetaminophen -Continue to monitor  Anxiety and Depression  -Continue Esctialopram 20 mg po Daily and Hydroxyzine 25-50 mg po BIDprn Anxiety   History of Thrombocytopenia -Currently is stable and Platelet Count was 187 -Continue to Monitor while he is on Enoxaparin  Hypertension secondary to Alcohol Withdrawal -Continue to Treat with Metoprolol 5 mg IV q6hprn for HBP -He does not have a history of hypertension and is not on any medications at home for this.  Intractable Nausea and Vomiting  -Secondary to alcohol withdrawal  -Improved. Continue to Treat Symptomatically with Supportive care and Antiemetics   Diarrhea  -Started yesterday has had no further diarrhea while in ER monitor closely  Hyperglycemia -Check HbA1c -Patient's Blood Sugars have been ranging from 94-114 on Daily BMP/CMPs -Continue to Monitor Blood Sugars carefully and if necessary will place on Sensitive Novolog SSI AC  Obesity -Estimated body mass  index is 30.11 kg/m as calculated from the following:   Height as of this encounter: 5\' 10"  (1.778 m).   Weight as of this encounter: 95.2 kg. -Weight Loss and Dietary Counseling given   DVT prophylaxis: Enoxaparin 40 mg sq q24h   Code Status: FULL CODE Family Communication: No family present at bedside  Disposition Plan: Pending Improvement of Withdrawal   Consultants:   None   Procedures: None  Antimicrobials:  Anti-infectives (From admission, onward)   None     Subjective: Seen and examined at bedside and states that his uvula still hurts he is still having some diarrhea, tremors, and some mild abdominal tenderness.  No nausea or vomiting today.  No other concerns or plans at this time  Objective: Vitals:   03/18/19 1430 03/18/19 1510 03/18/19 1634 03/19/19 0431  BP: 124/82  (!) 153/97 (!) 136/97  Pulse: 85 67 60 (!) 54  Resp: 20  20 16   Temp:   99 F (37.2 C) 98 F (36.7 C)  TempSrc:   Oral Oral  SpO2: 96% 99% 99% 99%  Weight:   95.2 kg   Height:   5\' 10"  (1.778 m)     Intake/Output Summary (Last 24 hours) at 03/19/2019 1252 Last data filed at 03/19/2019 Gross per 24 hour  Intake 1522.8 ml  Output 1025 ml  Net 497.8 ml   Filed Weights   03/18/19 1634  Weight: 95.2 kg   Examination: Physical Exam:  Constitutional: WN/WD obese Caucasian male in NAD and appears tremulous Eyes: Lids and conjunctivae normal, sclerae anicteric  ENMT: External Ears, Nose appear normal. Grossly normal hearing.  Neck: Appears normal, supple, no cervical masses, normal ROM, no appreciable thyromegaly; no JVD Respiratory: Diminished to auscultation bilaterally, no wheezing, rales, rhonchi or crackles. Normal respiratory effort and patient is not tachypenic. No accessory muscle use.  Cardiovascular: RRR, no murmurs / rubs / gallops. S1 and S2 auscultated. No extremity edema.  Abdomen: Soft, mildly tender, Distended 2/2 to body habitus. Bowel sounds positive x4.  GU: Deferred. Musculoskeletal: No clubbing / cyanosis of digits/nails. No joint deformity upper and lower extremities. Good ROM, no contractures. Normal strength and muscle tone.  Skin: No rashes, lesions, ulcers on a limited skin evaluation. No  induration; Warm and dry.  Neurologic: CN 2-12 grossly intact with no focal deficits. Continues to have tremors Psychiatric: Normal judgment and insight. Alert and oriented x 3. Anxious mood and appropriate affect.   Data Reviewed: I have personally reviewed following labs and imaging studies  CBC: Recent Labs  Lab 03/18/19 0658 03/18/19 1430 03/19/19 0353  WBC 9.0 7.5 5.4  HGB 14.9 13.8 13.4  HCT 44.6 41.1 41.5  MCV 93.7 96.5 98.1  PLT 248 195 187   Basic Metabolic Panel: Recent Labs  Lab 03/18/19 0658 03/18/19 1655 03/19/19 0353  NA 138 140 137  K 4.1 3.6 3.5  CL 101 103 102  CO2 21* 28 26  GLUCOSE 114* 94 101*  BUN 22* 15 15  CREATININE 0.76 0.82 0.80  CALCIUM 8.7* 7.9* 7.9*  MG  --   --  2.2  PHOS  --   --  3.5   GFR: Estimated Creatinine Clearance: 123.7 mL/min (by C-G formula based on SCr of 0.8 mg/dL). Liver Function Tests: Recent Labs  Lab 03/18/19 0658 03/19/19 0353  AST 37 23  ALT 22 19  ALKPHOS 44 35*  BILITOT 0.8 1.1  PROT 7.7 5.8*  ALBUMIN 4.6 3.3*   No results for  input(s): LIPASE, AMYLASE in the last 168 hours. No results for input(s): AMMONIA in the last 168 hours. Coagulation Profile: No results for input(s): INR, PROTIME in the last 168 hours. Cardiac Enzymes: No results for input(s): CKTOTAL, CKMB, CKMBINDEX, TROPONINI in the last 168 hours. BNP (last 3 results) No results for input(s): PROBNP in the last 8760 hours. HbA1C: No results for input(s): HGBA1C in the last 72 hours. CBG: No results for input(s): GLUCAP in the last 168 hours. Lipid Profile: No results for input(s): CHOL, HDL, LDLCALC, TRIG, CHOLHDL, LDLDIRECT in the last 72 hours. Thyroid Function Tests: No results for input(s): TSH, T4TOTAL, FREET4, T3FREE, THYROIDAB in the last 72 hours. Anemia Panel: No results for input(s): VITAMINB12, FOLATE, FERRITIN, TIBC, IRON, RETICCTPCT in the last 72 hours. Sepsis Labs: No results for input(s): PROCALCITON, LATICACIDVEN in the  last 168 hours.  Recent Results (from the past 240 hour(s))  SARS CORONAVIRUS 2 (TAT 6-24 HRS) Nasopharyngeal Nasopharyngeal Swab     Status: None   Collection Time: 03/18/19  8:19 AM   Specimen: Nasopharyngeal Swab  Result Value Ref Range Status   SARS Coronavirus 2 NEGATIVE NEGATIVE Final    Comment: (NOTE) SARS-CoV-2 target nucleic acids are NOT DETECTED. The SARS-CoV-2 RNA is generally detectable in upper and lower respiratory specimens during the acute phase of infection. Negative results do not preclude SARS-CoV-2 infection, do not rule out co-infections with other pathogens, and should not be used as the sole basis for treatment or other patient management decisions. Negative results must be combined with clinical observations, patient history, and epidemiological information. The expected result is Negative. Fact Sheet for Patients: HairSlick.nohttps://www.fda.gov/media/138098/download Fact Sheet for Healthcare Providers: quierodirigir.comhttps://www.fda.gov/media/138095/download This test is not yet approved or cleared by the [place] FDA and  has been authorized for detection and/or diagnosis of SARS-CoV-2 by FDA under an Emergency Use Authorization (EUA). This EUA will remain  in effect (meaning this test can be used) for the duration of the COVID-19 declaration under Section 56 4(b)(1) of the Act, 21 U.S.C. section 360bbb-3(b)(1), unless the authorization is terminated or revoked sooner. Performed at [facility], [address] [place], [place] [address]     Scheduled Meds: . enoxaparin (LOVENOX) injection  40 mg Subcutaneous Q24H  . folic acid  1 mg Oral Daily  . LORazepam  0-4 mg Intravenous Q6H   Or  . LORazepam  0-4 mg Oral Q6H  . [START ON 03/20/2019] LORazepam  0-4 mg Intravenous Q12H   Or  . [START ON 03/20/2019] LORazepam  0-4 mg Oral Q12H  . multivitamin with minerals  1 tablet Oral Daily  . thiamine  100 mg Oral Daily   Or  . thiamine  100 mg Intravenous Daily    Continuous Infusions: . sodium chloride 100 mL/hr at 03/19/19 1228    LOS: 1 day   Merlene Laughtermair Latif , DO [facility] PAGER is on AMION  If 7PM-7AM, please contact night-coverage [email or web address] Password [email or web address] 03/19/2019, 12:52 PM

## 2019-03-20 LAB — COMPREHENSIVE METABOLIC PANEL
ALT: 16 U/L (ref 0–44)
AST: 21 U/L (ref 15–41)
Albumin: 3.4 g/dL — ABNORMAL LOW (ref 3.5–5.0)
Alkaline Phosphatase: 34 U/L — ABNORMAL LOW (ref 38–126)
Anion gap: 8 (ref 5–15)
BUN: 13 mg/dL (ref 6–20)
CO2: 25 mmol/L (ref 22–32)
Calcium: 8.2 mg/dL — ABNORMAL LOW (ref 8.9–10.3)
Chloride: 107 mmol/L (ref 98–111)
Creatinine, Ser: 0.81 mg/dL (ref 0.61–1.24)
GFR calc Af Amer: >60 mL/min (ref >60)
GFR calc non Af Amer: >60 mL/min (ref >60)
Glucose, Bld: 93 mg/dL (ref 70–99)
Potassium: 3.8 mmol/L (ref 3.5–5.1)
Sodium: 140 mmol/L (ref 135–145)
Total Bilirubin: 1.1 mg/dL (ref 0.3–1.2)
Total Protein: 5.9 g/dL — ABNORMAL LOW (ref 6.5–8.1)

## 2019-03-20 LAB — HEMOGLOBIN A1C
Hgb A1c MFr Bld: 5.4 % (ref 4.8–5.6)
Mean Plasma Glucose: 108.28 mg/dL

## 2019-03-20 LAB — CBC WITH DIFFERENTIAL/PLATELET
Abs Immature Granulocytes: 0.02 10*3/uL (ref 0.00–0.07)
Basophils Absolute: 0 10*3/uL (ref 0.0–0.1)
Basophils Relative: 1 %
Eosinophils Absolute: 0.1 10*3/uL (ref 0.0–0.5)
Eosinophils Relative: 2 %
HCT: 40.7 % (ref 39.0–52.0)
Hemoglobin: 13.1 g/dL (ref 13.0–17.0)
Immature Granulocytes: 0 %
Lymphocytes Relative: 40 %
Lymphs Abs: 1.9 10*3/uL (ref 0.7–4.0)
MCH: 31.5 pg (ref 26.0–34.0)
MCHC: 32.2 g/dL (ref 30.0–36.0)
MCV: 97.8 fL (ref 80.0–100.0)
Monocytes Absolute: 0.4 10*3/uL (ref 0.1–1.0)
Monocytes Relative: 8 %
Neutro Abs: 2.3 10*3/uL (ref 1.7–7.7)
Neutrophils Relative %: 49 %
Platelets: 173 10*3/uL (ref 150–400)
RBC: 4.16 MIL/uL — ABNORMAL LOW (ref 4.22–5.81)
RDW: 13.5 % (ref 11.5–15.5)
WBC: 4.8 10*3/uL (ref 4.0–10.5)
nRBC: 0 % (ref 0.0–0.2)

## 2019-03-20 LAB — MAGNESIUM: Magnesium: 2.3 mg/dL (ref 1.7–2.4)

## 2019-03-20 LAB — PHOSPHORUS: Phosphorus: 3.6 mg/dL (ref 2.5–4.6)

## 2019-03-20 LAB — GROUP A STREP BY PCR: Group A Strep by PCR: NOT DETECTED

## 2019-03-20 MED ORDER — ACETAMINOPHEN 325 MG PO TABS
650.0000 mg | ORAL_TABLET | Freq: Four times a day (QID) | ORAL | Status: DC | PRN
  Administered 2019-03-20: 650 mg via ORAL
  Filled 2019-03-20: qty 2

## 2019-03-20 NOTE — Progress Notes (Signed)
PROGRESS NOTE    Jermaine [name]  ZTI:[id number] DOB: 1966/03/20 DOA: 03/18/2019 PCP: London Pepper, MD  Brief Narrative:  HPI per Dr. Landis Gandy on 03/18/2019 Jermaine [name] is a 53 y.o. male with medical history significant of alcohol abuse,cirrhosis, multiple hospital admits for the same now coming in with binge drinking case and a half of white- claw  Daily for last 5 days .alcohol level 50.he reports nausea vomitting diarrhea since yesterday.last meal was yesterday.no sick contacts,fever,chills,cough,urinary complaints.has a head ache typical for withdrawal,no changes with vision.no history of seizures. No hematemesis,melena.denies drug use. Has tremors when he stops drinking. C/o hallucinations and tremors similar to prior episodes  RECENT ER VISIT 9/12 Patient left ER before receiving prescription for librium taper.  ED Course: na 138 k 4.1,bun22 cr 0.76 ,LFT normal,wbc 9.0,hb 14.9,platelet 248.alcohol level 50,uds positive for benzos,gap 16,UA pending.  hypertensive in er.no tachycardia, COVID PENDING  **Interim History Continues to have withdrawal symptoms and has tremors but thinks his tremors are improving and continues to have some diarrhea.  Started to complain of some throat pain and has inflamed uvula so checking strep PCR and mono screen as well as CMV and strep PCR was negative and CMV is pending.  Assessment & Plan:   Active Problems:   Alcohol withdrawal (HCC)  Alcohol Abuse with Alcohol With/Delirium Tremens  -Patient admitted with binge drinking for the last 5 days.  He drank a case and a half of white claw.   -EtOH Level on admission was 50 -He reports he does not drink daily but he when he starts drinking he drinks  continuously for many days. -He is hypertensive not tachycardic and bradycardic.   -Continues to have Tremors.  His initial CIWA scale in the ER was 20 and overnight it was 22 and then repeated was 16 earlier this morning.  Last CIWA score at  8 AM this morning was 10 -CIWA protocol initiated with po/IV Lorazepam 0-4 mg IV q6h for 8 Dosesbut may need to Escalate given continued elevation of CIWA score -C/w Folic Acid 1 mg po Daily, MVI+Minerals 1 tab po Daily, and Thiamine 100 mg po/IV Daily -Continue supportive care with normal saline rate 100 MLS per hour continue with antiemetics with ondansetron -Consulted social worker in the ER who already saw the patient it looks like patient already has all the information he needs to quit drinking alcohol. -Continues to have some mild tremors and diarrhea  Anion Gap Metabolic Acidosis likely secondary to Alcohol Abuse -Anion gap on admission was 16 with a CO2 of 21 and a chloride of 101 -Was given IV fluid hydration with normal saline rate of 100 mL/hr and will continue today and reduce rate to 75 mL's per hour -Patient is anion gap is now 8 with a CO2 of 25 and a chloride level of 107  Uvulitis -Patient usually was red and swollen-check strep PCR and CMV and infectious mononucleosis screen -Given a one-time dose of IV Toradol for pain and will continue Benadryl and acetaminophen -Continue to monitor an seems to be improving   Anxiety and Depression  -Continue Esctialopram 20 mg po Daily and Hydroxyzine 25-50 mg po BIDprn Anxiety   History of Thrombocytopenia -Currently is stable and Platelet Count was 187 and is now 173 -Continue to Monitor while he is on Enoxaparin  Hypertension secondary to Alcohol Withdrawal -Continue to Treat with Metoprolol 5 mg IV q6hprn for HBP but may need to stop due to Bradycardia -He does not have a history  of hypertension and is not on any medications at home for this.  Intractable Nausea and Vomiting  -Secondary to alcohol withdrawal  -Improved. Continue to Treat Symptomatically with Supportive care and Antiemetics   Diarrhea  -Started yesterday has had no further diarrhea while in ER monitor closely -Continues to have Diarrhea   Hyperglycemia -Checked HbA1c and was 5.4 -Patient's Blood Sugars have been ranging from 94-114 on Daily BMP/CMPs -Continue to Monitor Blood Sugars carefully and if necessary will place on Sensitive Novolog SSI AC  Obesity -Estimated body mass index is 30.11 kg/m as calculated from the following:   Height as of this encounter: 5\' 10"  (1.778 m).   Weight as of this encounter: 95.2 kg. -Weight Loss and Dietary Counseling given   DVT prophylaxis: Enoxaparin 40 mg sq q24h  Code Status: FULL CODE Family Communication: No family present at bedside  Disposition Plan: Pending Improvement of Withdrawal Symptoms and CIWA consistently less than 5  Consultants:   None   Procedures: None  Antimicrobials:  Anti-infectives (From admission, onward)   None     Subjective: Seen and examined at bedside and states that his tremors are improved slightly but still having diarrhea that is unchanged from yesterday.  No nausea or vomiting and tolerated his food without issues.  States that his uvula was not hurting worse today.  No other concerns or complaints at this time.   Objective: Vitals:   03/19/19 1945 03/19/19 2352 03/20/19 0114 03/20/19 0539  BP:  (!) 145/94 135/90 (!) 142/82  Pulse:  (!) 51 (!) 52 (!) 47  Resp:  18  18  Temp: 98.6 F (37 C) 97.6 F (36.4 C)  97.9 F (36.6 C)  TempSrc: Oral   Oral  SpO2:  98%  99%  Weight:      Height:        Intake/Output Summary (Last 24 hours) at 03/20/2019 1220 Last data filed at 03/20/2019 03/22/2019 Gross per 24 hour  Intake 2619.86 ml  Output 2125 ml  Net 494.86 ml   Filed Weights   03/18/19 1634  Weight: 95.2 kg   Examination: Physical Exam:  Constitutional: WN/WD obese Caucasian male in NAD and appears calmer but still withdrawing  Eyes: Lids and conjunctivae normal, sclerae anicteric  ENMT: External Ears, Nose appear normal. Grossly normal hearing.  Neck: Appears normal, supple, no cervical masses, normal ROM, no appreciable  thyromegaly; no JVD Respiratory: Diminished to auscultation bilaterally, no wheezing, rales, rhonchi or crackles. Normal respiratory effort and patient is not tachypenic. No accessory muscle use. Unlabored breathing   Cardiovascular: RRR, no murmurs / rubs / gallops. S1 and S2 auscultated. No extremity edema. Abdomen: Soft, non-tender, non-distended. No masses palpated. No appreciable hepatosplenomegaly. Bowel sounds positive x4.  GU: Deferred. Musculoskeletal: No clubbing / cyanosis of digits/nails. No joint deformity upper and lower extremities.   Skin: No rashes, lesions, ulcers on a limited skin evaluation. No induration; Warm and dry.  Neurologic: CN 2-12 grossly intact with no focal deficits. Romberg sign and cerebellar reflexes not assessed.  Psychiatric: Normal judgment and insight. Alert and oriented x 3. Slightly Anxious mood and appropriate affect.   Data Reviewed: I have personally reviewed following labs and imaging studies  CBC: Recent Labs  Lab 03/18/19 0658 03/18/19 1430 03/19/19 0353 03/20/19 0346  WBC 9.0 7.5 5.4 4.8  NEUTROABS  --   --   --  2.3  HGB 14.9 13.8 13.4 13.1  HCT 44.6 41.1 41.5 40.7  MCV 93.7 96.5 98.1 97.8  PLT 248 195 187 173   Basic Metabolic Panel: Recent Labs  Lab 03/18/19 0658 03/18/19 1655 03/19/19 0353 03/20/19 0346  NA 138 140 137 140  K 4.1 3.6 3.5 3.8  CL 101 103 102 107  CO2 21* 28 26 25   GLUCOSE 114* 94 101* 93  BUN 22* 15 15 13   CREATININE 0.76 0.82 0.80 0.81  CALCIUM 8.7* 7.9* 7.9* 8.2*  MG  --   --  2.2 2.3  PHOS  --   --  3.5 3.6   GFR: Estimated Creatinine Clearance: 122.2 mL/min (by C-G formula based on SCr of 0.81 mg/dL). Liver Function Tests: Recent Labs  Lab 03/18/19 0658 03/19/19 0353 03/20/19 0346  AST 37 23 21  ALT 22 19 16   ALKPHOS 44 35* 34*  BILITOT 0.8 1.1 1.1  PROT 7.7 5.8* 5.9*  ALBUMIN 4.6 3.3* 3.4*   No results for input(s): LIPASE, AMYLASE in the last 168 hours. No results for input(s):  AMMONIA in the last 168 hours. Coagulation Profile: No results for input(s): INR, PROTIME in the last 168 hours. Cardiac Enzymes: No results for input(s): CKTOTAL, CKMB, CKMBINDEX, TROPONINI in the last 168 hours. BNP (last 3 results) No results for input(s): PROBNP in the last 8760 hours. HbA1C: Recent Labs    03/20/19 0346  HGBA1C 5.4   CBG: No results for input(s): GLUCAP in the last 168 hours. Lipid Profile: No results for input(s): CHOL, HDL, LDLCALC, TRIG, CHOLHDL, LDLDIRECT in the last 72 hours. Thyroid Function Tests: No results for input(s): TSH, T4TOTAL, FREET4, T3FREE, THYROIDAB in the last 72 hours. Anemia Panel: No results for input(s): VITAMINB12, FOLATE, FERRITIN, TIBC, IRON, RETICCTPCT in the last 72 hours. Sepsis Labs: No results for input(s): PROCALCITON, LATICACIDVEN in the last 168 hours.  Recent Results (from the past 240 hour(s))  SARS CORONAVIRUS 2 (TAT 6-24 HRS) Nasopharyngeal Nasopharyngeal Swab     Status: None   Collection Time: 03/18/19  8:19 AM   Specimen: Nasopharyngeal Swab  Result Value Ref Range Status   SARS Coronavirus 2 NEGATIVE NEGATIVE Final    Comment: (NOTE) SARS-CoV-2 target nucleic acids are NOT DETECTED. The SARS-CoV-2 RNA is generally detectable in upper and lower respiratory specimens during the acute phase of infection. Negative results do not preclude SARS-CoV-2 infection, do not rule out co-infections with other pathogens, and should not be used as the sole basis for treatment or other patient management decisions. Negative results must be combined with clinical observations, patient history, and epidemiological information. The expected result is Negative. Fact Sheet for Patients: HairSlick.nohttps://www.fda.gov/media/138098/download Fact Sheet for Healthcare Providers: quierodirigir.comhttps://www.fda.gov/media/138095/download This test is not yet approved or cleared by the [place] FDA and  has been authorized for detection and/or diagnosis of  SARS-CoV-2 by FDA under an Emergency Use Authorization (EUA). This EUA will remain  in effect (meaning this test can be used) for the duration of the COVID-19 declaration under Section 56 4(b)(1) of the Act, 21 U.S.C. section 360bbb-3(b)(1), unless the authorization is terminated or revoked sooner. Performed at [facility], [address] [place], [place] [address]   Group A Strep by PCR     Status: None   Collection Time: 03/19/19  7:15 PM  Result Value Ref Range Status   Group A Strep by PCR NOT DETECTED NOT DETECTED Final    Comment: Performed at [facility], [address] [place], [place] [address]    Scheduled Meds: . enoxaparin (LOVENOX) injection  40 mg Subcutaneous Q24H  .  escitalopram  20 mg Oral Daily  . folic acid  1 mg Oral Daily  . LORazepam  0-4 mg Intravenous Q12H   Or  . LORazepam  0-4 mg Oral Q12H  . multivitamin with minerals  1 tablet Oral Daily  . thiamine  100 mg Oral Daily   Or  . thiamine  100 mg Intravenous Daily   Continuous Infusions: . sodium chloride 100 mL/hr at 03/20/19 0804    LOS: 2 days   Merlene Laughter, DO [facility] PAGER is on AMION  If 7PM-7AM, please contact night-coverage [email or web address] Password [email or web address] 03/20/2019, 12:20 PM

## 2019-03-21 MED ORDER — LORAZEPAM 1 MG PO TABS
1.0000 mg | ORAL_TABLET | ORAL | Status: AC | PRN
  Administered 2019-03-21: 21:00:00 2 mg via ORAL
  Filled 2019-03-21: qty 2

## 2019-03-21 MED ORDER — LORAZEPAM 2 MG/ML IJ SOLN: 1.0000 mg | INTRAMUSCULAR | Status: AC | PRN

## 2019-03-21 NOTE — Progress Notes (Signed)
PROGRESS NOTE    Jermaine [name]  GBT:[id number] DOB: 1965-08-29 DOA: 03/18/2019 PCP: London Pepper, MD  Brief Narrative:  HPI per Dr. Landis Gandy on 03/18/2019 Jermaine [name] is a 53 y.o. male with medical history significant of alcohol abuse,cirrhosis, multiple hospital admits for the same now coming in with binge drinking case and a half of white- claw  Daily for last 5 days .alcohol level 50.he reports nausea vomitting diarrhea since yesterday.last meal was yesterday.no sick contacts,fever,chills,cough,urinary complaints.has a head ache typical for withdrawal,no changes with vision.no history of seizures. No hematemesis,melena.denies drug use. Has tremors when he stops drinking. C/o hallucinations and tremors similar to prior episodes  RECENT ER VISIT 9/12 Patient left ER before receiving prescription for librium taper.  ED Course: na 138 k 4.1,bun22 cr 0.76 ,LFT normal,wbc 9.0,hb 14.9,platelet 248.alcohol level 50,uds positive for benzos,gap 16,UA pending.  hypertensive in er.no tachycardia, COVID PENDING  **Interim History Continued to have withdrawal symptoms and has tremors but thinks his tremors are improving and continues to have some diarrhea but this is also improving.  Started to complain of some throat pain and has inflamed uvula so checking strep PCR and mono screen as well as CMV and strep PCR was negative and CMV is pending.  All symptoms are improving and his CIWA scores are trending down and his last 4 CIWA scores were 9, 11, 8, 5.  Nurse reporting that his symptoms were milder today.  Anticipating discharge home in the next 24 to 48 hours.  Assessment & Plan:   Active Problems:   Alcohol withdrawal (HCC)  Alcohol Abuse with Alcohol With/Delirium Tremens, improving   -Patient admitted with binge drinking for the last 5 days.  He drank a case and a half of white claw. Last Drink was Sunday 10/18 reportedly  -EtOH Level on admission was 50 -He reports he does not  drink daily but he when he starts drinking he drinks  continuously for many days. -He is hypertensive not tachycardic and bradycardic.   -Continued to have Tremors but this is Improving   His last four CIWA scores were 9, 11, 8, and 5.  -CIWA protocol initiated with po/IV Lorazepam 0-4 mg IV q6h for 8 Dosesbut may need to Escalate given continued elevation of CIWA score -C/w Folic Acid 1 mg po Daily, MVI+Minerals 1 tab po Daily, and Thiamine 100 mg po/IV Daily -Continued supportive care with normal saline rate 100 MLS per hour but have reduced to 50 mL/hr now continue with antiemetics with ondansetron -Consulted social worker in the ER who already saw the patient it looks like patient already has all the information he needs to quit drinking alcohol. -Continues to have some mild tremors and diarrhea but this is improving   Anion Gap Metabolic Acidosis likely secondary to Alcohol Abuse -Anion gap on admission was 16 with a CO2 of 21 and a chloride of 101 -Was given IV fluid hydration with normal saline rate of 100 mL/hr and will continue today and reduce rate to 75 mL's per hour -Patient is anion gap yesterday was 8 with a CO2 of 25 and a chloride level of 107 -Will not repeat Labs as he is stable   Uvulitis -Patient usually was red and swollen-check strep PCR and CMV and infectious mononucleosis screen -Given a one-time dose of IV Toradol for pain and will continue Benadryl and acetaminophen -Continue to monitor an seems to be improving   Anxiety and Depression  -Continue Esctialopram 20 mg po Daily and Hydroxyzine 25-50  mg po BIDprn Anxiety   History of Thrombocytopenia -Currently is stable and Platelet Count was 187 and is now 173 -Continue to Monitor for S/Sx of Bleeding  -Will not repeat CBC in AM as his labs are stable   Hypertension secondary to Alcohol Withdrawal -Continue to Treat with Metoprolol 5 mg IV q6hprn for HBP but may need to stop due to Bradycardia -He does not  have a history of hypertension and is not on any medications at home for this.  Intractable Nausea and Vomiting  -Secondary to alcohol withdrawal  -Improved. Continue to Treat Symptomatically with Supportive care and Antiemetics   Diarrhea  -Started yesterday has had no further diarrhea while in ER monitor closely -Continues to have Diarrhea but thinks it is improving   Hyperglycemia -Checked HbA1c and was 5.4 -Patient's Blood Sugars have been ranging from 94-114 on Daily BMP/CMPs -Continue to Monitor Blood Sugars carefully and if necessary will place on Sensitive Novolog SSI AC  Obesity -Estimated body mass index is 30.11 kg/m as calculated from the following:   Height as of this encounter:  (1.778 m).   Weight as of this encounter: 95.2 kg. -Weight Loss and Dietary Counseling given   DVT prophylaxis: Enoxaparin 40 mg sq q24h  Code Status: FULL CODE Family Communication: No family present at bedside  Disposition Plan: Anticipating D/C home in the next 24-48 hours   Consultants:   None   Procedures: None  Antimicrobials:  Anti-infectives (From admission, onward)   None     Subjective: Seen and examined at bedside.  Doing well.  States that his tremors are improved but still having some diarrhea but states that is also improved.  No chest pain, lightheadedness or dizziness.  No nausea or vomiting.  Hopeful to be discharged tomorrow as his CIWA scores are improving.  No other concerns or complaints at this time.  Objective: Vitals:   03/20/19 2206 03/21/19 0455 03/21/19 1146 03/21/19 1203  BP: 130/84 (!) 136/92 (!) 146/100 (!) 148/82  Pulse: (!) 59 (!) 59 71   Resp: Temp: 98 F (36.7 C) 97.9 F (36.6 C) 98.5 F (36.9 C)   TempSrc:   Oral   SpO2: 98% 98% 96%   Weight:      Height:        Intake/Output Summary (Last 24 hours) at 03/21/2019 1340 Last data filed at 03/21/2019 1610 Gross per 24 hour  Intake 1343.23 ml  Output 2175 ml  Net  -831.77 ml   Filed Weights   03/18/19 1634  Weight: 95.2 kg   Examination: Physical Exam:  Constitutional: WN/WD obese Caucasian male in NAD and appears calm and comfortable Eyes: Lids and conjunctivae normal, sclerae anicteric  ENMT: External Ears, Nose appear normal. Grossly normal hearing. Mucous membranes are moist.  Neck: Appears normal, supple, no cervical masses, normal ROM, no appreciable thyromegaly; no JVD Respiratory: Diminished to auscultation bilaterally, no wheezing, rales, rhonchi or crackles. Normal respiratory effort and patient is not tachypenic. No accessory muscle use. Unlabored breathing  Cardiovascular: RRR, no murmurs / rubs / gallops. S1 and S2 auscultated. No extremity edema. Abdomen: Soft, non-tender, Distended 2/2 to body habitus. Bowel sounds positive x4.  GU: Deferred. Musculoskeletal: No clubbing / cyanosis of digits/nails. No joint deformity upper and lower extremities.  Skin: No rashes, lesions, ulcers on a limited skin evaluation. No induration; Warm and dry.  Neurologic: CN 2-12 grossly intact with no focal deficits. Romberg sign and cerebellar reflexes not assessed.  Psychiatric: Normal judgment and insight. Alert and oriented x 3. Not as anxious today and has a pleasant mood and appropriate affect.   Data Reviewed: I have personally reviewed following labs and imaging studies  CBC: Recent Labs  Lab 03/18/19 0658 03/18/19 1430 03/19/19 0353 03/20/19 0346  WBC 9.0 7.5 5.4 4.8  NEUTROABS  --   --   --  2.3  HGB 14.9 13.8 13.4 13.1  HCT 44.6 41.1 41.5 40.7  MCV 93.7 96.5 98.1 97.8  PLT 248 195 187 173   Basic Metabolic Panel: Recent Labs  Lab 03/18/19 0658 03/18/19 1655 03/19/19 0353 03/20/19 0346  NA 138 140 137 140  K 4.1 3.6 3.5 3.8  CL 101 103 102 107  CO2 21* 28 26 25   GLUCOSE 114* 94 101* 93  BUN 22* 15 15 13   CREATININE 0.76 0.82 0.80 0.81  CALCIUM 8.7* 7.9* 7.9* 8.2*  MG  --   --  2.2 2.3  PHOS  --   --  3.5 3.6   GFR:  Estimated Creatinine Clearance: 122.2 mL/min (by C-G formula based on SCr of 0.81 mg/dL). Liver Function Tests: Recent Labs  Lab 03/18/19 0658 03/19/19 0353 03/20/19 0346  AST 37 23 21  ALT 22 19 16   ALKPHOS 44 35* 34*  BILITOT 0.8 1.1 1.1  PROT 7.7 5.8* 5.9*  ALBUMIN 4.6 3.3* 3.4*   No results for input(s): LIPASE, AMYLASE in the last 168 hours. No results for input(s): AMMONIA in the last 168 hours. Coagulation Profile: No results for input(s): INR, PROTIME in the last 168 hours. Cardiac Enzymes: No results for input(s): CKTOTAL, CKMB, CKMBINDEX, TROPONINI in the last 168 hours. BNP (last 3 results) No results for input(s): PROBNP in the last 8760 hours. HbA1C: Recent Labs    03/20/19 0346  HGBA1C 5.4   CBG: No results for input(s): GLUCAP in the last 168 hours. Lipid Profile: No results for input(s): CHOL, HDL, LDLCALC, TRIG, CHOLHDL, LDLDIRECT in the last 72 hours. Thyroid Function Tests: No results for input(s): TSH, T4TOTAL, FREET4, T3FREE, THYROIDAB in the last 72 hours. Anemia Panel: No results for input(s): VITAMINB12, FOLATE, FERRITIN, TIBC, IRON, RETICCTPCT in the last 72 hours. Sepsis Labs: No results for input(s): PROCALCITON, LATICACIDVEN in the last 168 hours.  Recent Results (from the past 240 hour(s))  SARS CORONAVIRUS 2 (TAT 6-24 HRS) Nasopharyngeal Nasopharyngeal Swab     Status: None   Collection Time: 03/18/19  8:19 AM   Specimen: Nasopharyngeal Swab  Result Value Ref Range Status   SARS Coronavirus 2 NEGATIVE NEGATIVE Final    Comment: (NOTE) SARS-CoV-2 target nucleic acids are NOT DETECTED. The SARS-CoV-2 RNA is generally detectable in upper and lower respiratory specimens during the acute phase of infection. Negative results do not preclude SARS-CoV-2 infection, do not rule out co-infections with other pathogens, and should not be used as the sole basis for treatment or other patient management decisions. Negative results must be combined  with clinical observations, patient history, and epidemiological information. The expected result is Negative. Fact Sheet for Patients: Fact Sheet for Healthcare Providers: 03/22/19 This test is not yet approved or cleared by the 03/20/19 FDA and  has been authorized for detection and/or diagnosis of SARS-CoV-2 by FDA under an Emergency Use Authorization (EUA). This EUA will remain  in effect (meaning this test can be used) for the duration of the COVID-19 declaration under Section 56 4(b)(1) of the Act, 21 U.S.C. section 360bbb-3(b)(1), unless the authorization is terminated  or revoked sooner. Performed at [facility], [address] [place], [place] [address]   Cytomegalovirus (CMV) Culture     Status: None   Collection Time: 03/19/19  6:27 PM   Specimen: Urine  Result Value Ref Range Status   Cytomegalovirus (CMV) Culture Comment  Final    Comment: (NOTE) Preliminary Report: No Cytomegalovirus isolated at 24 hours. Next report to follow after 1 week. Performed At: [facility] [address] [place], [place] [id number] Jolene SchimkeNagendra Sanjai MD XB:[phone]    Source of Sample URINE, RANDOM  Final    Comment: Performed at [facility], [address] [place], [place] [address]  Group A Strep by PCR     Status: None   Collection Time: 03/19/19  7:15 PM  Result Value Ref Range Status   Group A Strep by PCR NOT DETECTED NOT DETECTED Final    Comment: Performed at [facility], [address] [place], [place] [address]    Scheduled Meds: . enoxaparin (LOVENOX) injection  40 mg Subcutaneous Q24H  . escitalopram  20 mg Oral Daily  . folic acid  1 mg Oral Daily  . LORazepam  0-4 mg Intravenous Q12H   Or  . LORazepam  0-4 mg Oral Q12H  . multivitamin with minerals  1 tablet Oral Daily  . thiamine  100 mg Oral Daily   Or  . thiamine  100 mg  Intravenous Daily   Continuous Infusions: . sodium chloride 50 mL/hr at 03/21/19 0929    LOS: 3 days   Merlene Laughtermair Latif Sheikh, DO [facility] is on AMION  If 7PM-7AM, please contact night-coverage [email or web address] Password [email or web address] 03/21/2019, 1:40 PM

## 2019-03-21 NOTE — TOC Progression Note (Signed)
Transition of Care [facility]) - Progression Note    Patient Details  Name: Zavon Hyson MRN: [id number] Date of Birth: Oct 16, 1965  Transition of Care [facility]) CM/SW [place], LCSW Phone Number: 03/21/2019, 12:39 PM  Clinical Narrative:   CSW received verbal consult to assist patient with SA resources. ED social worker Leeroy Cha has already assessed patient for SA needs and has also given patient resources. Per Novia's  note patient is aware of all the resources at his disposal. Please consult again for further needs.         Expected Discharge Plan and Services                                                 Social Determinants of Health (SDOH) Interventions    Readmission Risk Interventions No flowsheet data found.

## 2019-03-22 MED ORDER — VITAMIN B-12 1000 MCG PO TABS: 2000.0000 ug | ORAL_TABLET | Freq: Every day | ORAL | 0 refills | Status: DC

## 2019-03-22 MED ORDER — THIAMINE HCL 100 MG PO TABS: 100.0000 mg | ORAL_TABLET | Freq: Every day | ORAL | 0 refills | Status: DC

## 2019-03-22 MED ORDER — FOLIC ACID 1 MG PO TABS: 1.0000 mg | ORAL_TABLET | Freq: Every day | ORAL | 0 refills | Status: DC

## 2019-03-22 NOTE — Discharge Instructions (Signed)
Alcohol Abuse and Dependence Information, Adult Alcohol is a widely available drug. People drink alcohol in different amounts. People who drink alcohol very often and in large amounts often have problems during and after drinking. They may develop what is called an alcohol use disorder. There are two main types of alcohol use disorders:  Alcohol abuse. This is when you use alcohol too much or too often. You may use alcohol to make yourself feel happy or to reduce stress. You may have a hard time setting a limit on the amount you drink.  Alcohol dependence. This is when you use alcohol consistently for a period of time, and your body changes as a result. This can make it hard to stop drinking because you may start to feel sick or feel different when you do not use alcohol. These symptoms are known as withdrawal. How can alcohol abuse and dependence affect me? Alcohol abuse and dependence can have a negative effect on your life. Drinking too much can lead to addiction. You may feel like you need alcohol to function normally. You may drink alcohol before work in the morning, during the day, or as soon as you get home from work in the evening. These actions can result in:  Poor work performance.  Job loss.  Financial problems.  Car crashes or criminal charges from driving after drinking alcohol.  Problems in your relationships with friends and family.  Losing the trust and respect of coworkers, friends, and family. Drinking heavily over a long period of time can permanently damage your body and brain, and can cause lifelong health issues, such as:  Damage to your liver or pancreas.  Heart problems, high blood pressure, or stroke.  Certain cancers.  Decreased ability to fight infections.  Brain or nerve damage.  Depression.  Early (premature) death. If you are careless or you crave alcohol, it is easy to drink more than your body can handle (overdose). Alcohol overdose is a serious  situation that requires hospitalization. It may lead to permanent injuries or death. What can increase my risk?  Having a family history of alcohol abuse.  Having depression or other mental health conditions.  Beginning to drink at an early age.  Binge drinking often.  Experiencing trauma, stress, and an unstable home life during childhood.  Spending time with people who drink often. What actions can I take to prevent or manage alcohol abuse and dependence?  Do not drink alcohol if: ? Your health care provider tells you not to drink. ? You are pregnant, may be pregnant, or are planning to become pregnant.  If you drink alcohol: ? Limit how much you use to:  0-1 drink a day for women.  0-2 drinks a day for men. ? Be aware of how much alcohol is in your drink. In the [place] one drink equals one 12 oz bottle of beer (355 mL), one 5 oz glass of wine (148 mL), or one 1 oz glass of hard liquor (44 mL).  Stop drinking if you have been drinking too much. This can be very hard to do if you are used to abusing alcohol. If you begin to have withdrawal symptoms, talk with your health care provider or a person that you trust. These symptoms may include anxiety, shaky hands, headache, nausea, sweating, or not being able to sleep.  Choose to drink nonalcoholic beverages in social gatherings and places where there may be alcohol. Activity  Spend more time on activities that you enjoy that do   not involve alcohol, like hobbies or exercise.  Find healthy ways to cope with stress, such as exercise, meditation, or spending time with people you care about. General information  Talk to your family, coworkers, and friends about supporting you in your efforts to stop drinking. If they drink, ask them not to drink around you. Spend more time with people who do not drink alcohol.  If you think that you have an alcohol dependency problem: ? Tell friends or family about your concerns. ? Talk with your  health care provider or another health professional about where to get help. ? Work with a therapist and a chemical dependency counselor. ? Consider joining a support group for people who struggle with alcohol abuse and dependence. Where to find support   Your health care provider.  SMART Recovery: www.smartrecovery.org Therapy and support groups  Local treatment centers or chemical dependency counselors.  Local AA groups in your community: www.aa.org Where to find more information  Centers for Disease Control and Prevention: www.cdc.gov  National Institute on Alcohol Abuse and Alcoholism: www.niaaa.nih.gov  Alcoholics Anonymous (AA): www.aa.org Contact a health care provider if:  You drank more or for longer than you intended on more than one occasion.  You tried to stop drinking or to cut back on how much you drink, but you were not able to.  You often drink to the point of vomiting or passing out.  You want to drink so badly that you cannot think about anything else.  You have problems in your life due to drinking, but you continue to drink.  You keep drinking even though you feel anxious, depressed, or have experienced memory loss.  You have stopped doing the things you used to enjoy in order to drink.  You have to drink more than you used to in order to get the effect you want.  You experience anxiety, sweating, nausea, shakiness, and trouble sleeping when you try to stop drinking. Get help right away if:  You have thoughts about hurting yourself or others.  You have serious withdrawal symptoms, including: ? Confusion. ? Racing heart. ? High blood pressure. ? Fever. If you ever feel like you may hurt yourself or others, or have thoughts about taking your own life, get help right away. You can go to your nearest emergency department or call:  Your local emergency services (911 in the [place]  A suicide crisis helpline, such as the National Suicide Prevention  Lifeline at 1-800-273-8255. This is open 24 hours a day. Summary  Alcohol abuse and dependence can have a negative effect on your life. Drinking too much or too often can lead to addiction.  If you drink alcohol, limit how much you use.  If you are having trouble keeping your drinking under control, find ways to change your behavior. Hobbies, calming activities, exercise, or support groups can help.  If you feel you need help with changing your drinking habits, talk with your health care provider, a good friend, or a therapist, or go to an AA group. This information is not intended to replace advice given to you by your health care provider. Make sure you discuss any questions you have with your health care provider. Document Released: 05/08/2016 Document Revised: 09/02/2018 Document Reviewed: 07/22/2018 Elsevier Patient Education  2020 [employer]  

## 2019-03-22 NOTE — Discharge Summary (Signed)
Physician Discharge Summary  Jermaine [name] XAJ:[id number] DOB: 20-Apr-1966 DOA: 03/18/2019  PCP: London Pepper, MD  Admit date: 03/18/2019 Discharge date: 03/22/2019  Admitted From: Home Disposition:  Home  Recommendations for Outpatient Follow-up:  1. Follow up with PCP in 1-2 weeks 2. Given Number to [facility]S in [place] and Will need further Alcoholic Counseling  3. Please obtain BMP/CBC in one week 4. Please follow up on the following pending results:  Home Health: No Equipment/Devices: None  Discharge Condition: Stable  CODE STATUS: FULL CODE Diet recommendation: Heart Healthy Diet   Brief/Interim Summary: HPI per Dr. Landis Gandy on 03/18/2019 [name] a 53 y.o.malewith medical history significant ofalcohol abuse,cirrhosis, multiple hospital admits for the same now coming in with binge drinking case and a half of white- claw Daily for last 5 days .alcohol level 50.hereports nausea vomitting diarrhea since yesterday.last meal was yesterday.no sick contacts,fever,chills,cough,urinary complaints.has a head ache typical for withdrawal,no changes with vision.no history of seizures. No hematemesis,melena.denies drug use. Has tremors when he stops drinking. C/o hallucinations and tremors similar to prior episodes  RECENT ER VISIT 9/12 Patient left ER before receiving prescription for librium taper.  ED Course:na 138 k 4.1,bun22 cr 0.76 ,LFT normal,wbc 9.0,hb 14.9,platelet 248.alcohol level 50,uds positive for benzos,gap 16,UA pending. hypertensive in er.no tachycardia, COVID PENDING  **Interim History Continued to have withdrawal symptoms and has tremors but thinks his tremors are improving and continues to have some diarrhea but this is also improving.  Started to complain of some throat pain and has inflamed uvula so checking strep PCR and mono screen as well as CMV and strep PCR was negative and CMV is pending.  All symptoms are improving and his CIWA scores  are trending down and his last CIWA scores have been 0. He is improved and deemed stable to D/C Home and will need to follow up with PCP within 1-2 weeks  Discharge Diagnoses:  Active Problems:   Alcohol withdrawal (HCC)  Alcohol Abuse with Alcohol With/Delirium Tremens, improved -Patient admitted with binge drinking for the last 5 days. He drank a case and a half of white claw. Last Drink was Sunday 10/18 reportedly  -EtOH Level on admission was 77 -He reports he does not drink daily but he when he starts drinking he drinks continuously for many days. -He is hypertensive not tachycardic and bradycardic.  -Continued to have Tremors but this is [place] resolved  His last CIWA Scores have been 0 -CIWA protocol initiated with po/IV Lorazepam 0-4 mg IV q6h and now stop -C/w Folic Acid 1 mg po Daily, MVI+Minerals 1 tab po Daily, and Thiamine 100 mg po/IV Daily -Continued supportive care with normal saline rate 100 MLS per hour but have reduced to 50 mL/hr now and will stop prior to D/C -Continue with antiemetics with ondansetron -Consulted social worker in the ER who already saw the patient it looks like patient already has all the information he needs to quit drinking alcohol. Given Number to ADS -Out of withdrawal now and stable to D/C home   Anion Gap Metabolic Acidosis likely secondary to Alcohol Abuse -Anion gap on admission was 16 with a CO2 of 21 and a chloride of 101 -Was given IV fluid hydration with normal saline rate of 100 mL/hr and will continue today and reduce rate to 75 mL's per hour -Patient is anion gap yesterday was 8 with a CO2 of 25 and a chloride level of 107 -Will not repeat Labs as he is stable and recommend outpatient follow  up within 1 week    Uvulitis -Patient usually was red and swollen-check strep PCR and CMV and infectious mononucleosis screen -Given a one-time dose of IV Toradol for pain and will continue Benadryl and acetaminophen -Continue to monitor an  seems to be improving  -Follow up with PCP if fails to improve further or worsens   Anxiety and Depression  -Continue Esctialopram 20 mg po Daily and Hydroxyzine 25-50 mg po BIDprn Anxiety   History of Thrombocytopenia -Currently is stable and Platelet Count was 187 and is now 173 last time it was checked -Continue to Monitor for S/Sx of Bleeding  -Will not repeat CBC in AM as his labs are stable   Hypertension secondary to Alcohol Withdrawal -Continue to Treat with Metoprolol 5 mg IV q6hprn for HBP but may need to stop due to Bradycardia -He does not have a history of hypertension and is not on any medications at home for this.  Intractable Nausea and Vomiting, improved  -Secondary to alcohol withdrawal and now resolved  -Improved. Continue to Treat Symptomatically with Supportive care and Antiemetics   Diarrhea  -Started yesterday has had no further diarrhea while in ER monitor closely -Continued to have Diarrhea but it is much improved   Hyperglycemia -Checked HbA1c and was 5.4 -Patient's Blood Sugars have been ranging from 94-114 on Daily BMP/CMPs -Continue to Monitor Blood Sugars carefully and if necessary will place on Sensitive Novolog SSI AC  Obesity -Estimated body mass index is 30.11 kg/m as calculated from the following:   Height as of this encounter:  (1.778 m).   Weight as of this encounter: 95.2 kg. -Weight Loss and Dietary Counseling given   Discharge Instructions  Discharge Instructions    Call MD for:  difficulty breathing, headache or visual disturbances   Complete by: As directed    Call MD for:  extreme fatigue   Complete by: As directed    Call MD for:  hives   Complete by: As directed    Call MD for:  persistant dizziness or light-headedness   Complete by: As directed    Call MD for:  persistant nausea and vomiting   Complete by: As directed    Call MD for:  redness, tenderness, or signs of infection (pain, swelling, redness, odor or  green/yellow discharge around incision site)   Complete by: As directed    Call MD for:  severe uncontrolled pain   Complete by: As directed    Call MD for:  temperature >100.4   Complete by: As directed    Diet - low sodium heart healthy   Complete by: As directed    Discharge instructions   Complete by: As directed    You were cared for by a hospitalist during your hospital stay. If you have any questions about your discharge medications or the care you received while you were in the hospital after you are discharged, you can call the unit and ask to speak with the hospitalist on call if the hospitalist that took care of you is not available. Once you are discharged, your primary care physician will handle any further medical issues. Please note that NO REFILLS for any discharge medications will be authorized once you are discharged, as it is imperative that you return to your primary care physician (or establish a relationship with a primary care physician if you do not have one) for your aftercare needs so that they can reassess your need for medications and monitor your lab  values.  Follow up with PCP and with resources provided for you. Take all medications as prescribed. If symptoms change or worsen please return to the ED for evaluation   Increase activity slowly   Complete by: As directed      Allergies as of 03/22/2019   No Known Allergies     Medication List    STOP taking these medications   chlordiazePOXIDE 25 MG capsule Commonly known as: LIBRIUM     TAKE these medications   escitalopram 20 MG tablet Commonly known as: LEXAPRO Take 20 mg by mouth daily.   folic acid 1 MG tablet Commonly known as: FOLVITE Take 1 tablet (1 mg total) by mouth daily.   hydrOXYzine 50 MG tablet Commonly known as: ATARAX/VISTARIL Take 25-50 mg by mouth 2 (two) times daily as needed for anxiety. What changed: Another medication with the same name was removed. Continue taking this  medication, and follow the directions you see here.   multivitamin with minerals Tabs tablet Take 1 tablet by mouth daily.   thiamine 100 MG tablet Take 1 tablet (100 mg total) by mouth daily.   vitamin B-12 1000 MCG tablet Commonly known as: CYANOCOBALAMIN Take 2 tablets (2,000 mcg total) by mouth daily.       No Known Allergies  Consultations: None  Procedures/Studies:  No results found.  Subjective: Seen and examined at bedside and he was doing much better today and was out of withdrawals.  States that his tremors have improved significantly.  No nausea or vomiting.  Lightheadedness or dizziness and ready to go home.  Advised to stay away from alcohol and the patient states "it is hard".  No other concerns or complaints at this time and will need to follow-up with PCP within 1 week.  Discharge Exam: Vitals:   03/22/19 0013 03/22/19 0523  BP: (!) 125/91 117/77  Pulse: 69 67  Resp: 20 18  Temp: 98.3 F (36.8 C) 97.8 F (36.6 C)  SpO2: 96% 96%   Vitals:   03/21/19 1203 03/21/19 1857 03/22/19 0013 03/22/19 0523  BP: (!) 148/82 (!) 132/92 (!) 125/91 117/77  Pulse:  71 69 67  Resp:  Temp:  98.5 F (36.9 C) 98.3 F (36.8 C) 97.8 F (36.6 C)  TempSrc:  Oral Oral Oral  SpO2:  95% 96% 96%  Weight:      Height:       General: Pt is alert, awake, not in acute distress Cardiovascular: RRR, S1/S2 +, no rubs, no gallops Respiratory: CTA bilaterally, no wheezing, no rhonchi Abdominal: Soft, NT, Distended due to body habitus, bowel sounds + Extremities: no edema, no cyanosis  The results of significant diagnostics from this hospitalization (including imaging, microbiology, ancillary and laboratory) are listed below for reference.    Microbiology: Recent Results (from the past 240 hour(s))  SARS CORONAVIRUS 2 (TAT 6-24 HRS) Nasopharyngeal Nasopharyngeal Swab     Status: None   Collection Time: 03/18/19  8:19 AM   Specimen: Nasopharyngeal Swab  Result Value  Ref Range Status   SARS Coronavirus 2 NEGATIVE NEGATIVE Final    Comment: (NOTE) SARS-CoV-2 target nucleic acids are NOT DETECTED. The SARS-CoV-2 RNA is generally detectable in upper and lower respiratory specimens during the acute phase of infection. Negative results do not preclude SARS-CoV-2 infection, do not rule out co-infections with other pathogens, and should not be used as the sole basis for treatment or other patient management decisions. Negative results must be combined with clinical  observations, patient history, and epidemiological information. The expected result is Negative. Fact Sheet for Patients: HairSlick.nohttps://www.fda.gov/media/138098/download Fact Sheet for Healthcare Providers: quierodirigir.comhttps://www.fda.gov/media/138095/download This test is not yet approved or cleared by the [place] FDA and  has been authorized for detection and/or diagnosis of SARS-CoV-2 by FDA under an Emergency Use Authorization (EUA). This EUA will remain  in effect (meaning this test can be used) for the duration of the COVID-19 declaration under Section 56 4(b)(1) of the Act, 21 U.S.C. section 360bbb-3(b)(1), unless the authorization is terminated or revoked sooner. Performed at [facility], [address] [place], [place] [address]   Cytomegalovirus (CMV) Culture     Status: None   Collection Time: 03/19/19  6:27 PM   Specimen: Urine  Result Value Ref Range Status   Cytomegalovirus (CMV) Culture Comment  Final    Comment: (NOTE) Preliminary Report: No Cytomegalovirus isolated at 24 hours. Next report to follow after 1 week. Performed At: [facility] [address] [place], [place] [id number] Jolene SchimkeNagendra Sanjai MD XB:[id number]:[phone]    Source of Sample URINE, RANDOM  Final    Comment: Performed at [facility], [address] [place], [place] [address]  Group A Strep by PCR     Status: None   Collection Time: 03/19/19  7:15 PM  Result Value Ref Range Status    Group A Strep by PCR NOT DETECTED NOT DETECTED Final    Comment: Performed at [facility], [address] [place], [place] [address]    Labs: BNP (last 3 results) No results for input(s): BNP in the last 8760 hours. Basic Metabolic Panel: Recent Labs  Lab 03/18/19 0658 03/18/19 1655 03/19/19 0353 03/20/19 0346  NA 138 140 137 140  K 4.1 3.6 3.5 3.8  CL 101 103 102 107  CO2 21* 28 26 25   GLUCOSE 114* 94 101* 93  BUN 22* 15 15 13   CREATININE 0.76 0.82 0.80 0.81  CALCIUM 8.7* 7.9* 7.9* 8.2*  MG  --   --  2.2 2.3  PHOS  --   --  3.5 3.6   Liver Function Tests: Recent Labs  Lab 03/18/19 0658 03/19/19 0353 03/20/19 0346  AST 37 23 21  ALT 22 19 16   ALKPHOS 44 35* 34*  BILITOT 0.8 1.1 1.1  PROT 7.7 5.8* 5.9*  ALBUMIN 4.6 3.3* 3.4*   No results for input(s): LIPASE, AMYLASE in the last 168 hours. No results for input(s): AMMONIA in the last 168 hours. CBC: Recent Labs  Lab 03/18/19 0658 03/18/19 1430 03/19/19 0353 03/20/19 0346  WBC 9.0 7.5 5.4 4.8  NEUTROABS  --   --   --  2.3  HGB 14.9 13.8 13.4 13.1  HCT 44.6 41.1 41.5 40.7  MCV 93.7 96.5 98.1 97.8  PLT 248 195 187 173   Cardiac Enzymes: No results for input(s): CKTOTAL, CKMB, CKMBINDEX, TROPONINI in the last 168 hours. BNP: Invalid input(s): POCBNP CBG: No results for input(s): GLUCAP in the last 168 hours. D-Dimer No results for input(s): DDIMER in the last 72 hours. Hgb A1c Recent Labs    03/20/19 0346  HGBA1C 5.4   Lipid Profile No results for input(s): CHOL, HDL, LDLCALC, TRIG, CHOLHDL, LDLDIRECT in the last 72 hours. Thyroid function studies No results for input(s): TSH, T4TOTAL, T3FREE, THYROIDAB in the last 72 hours.  Invalid input(s): FREET3 Anemia work up No results for input(s): VITAMINB12, FOLATE, FERRITIN, TIBC, IRON, RETICCTPCT in the last 72 hours. Urinalysis  Component Value Date/Time   COLORURINE YELLOW 03/18/2019 1148   APPEARANCEUR CLEAR 03/18/2019  1148   LABSPEC 1.020 03/18/2019 1148   PHURINE 7.0 03/18/2019 1148   GLUCOSEU NEGATIVE 03/18/2019 1148   HGBUR NEGATIVE 03/18/2019 1148   BILIRUBINUR NEGATIVE 03/18/2019 1148   KETONESUR NEGATIVE 03/18/2019 1148   PROTEINUR NEGATIVE 03/18/2019 1148   NITRITE NEGATIVE 03/18/2019 1148   LEUKOCYTESUR NEGATIVE 03/18/2019 1148   Sepsis Labs Invalid input(s): PROCALCITONIN,  WBC,  LACTICIDVEN Microbiology Recent Results (from the past 240 hour(s))  SARS CORONAVIRUS 2 (TAT 6-24 HRS) Nasopharyngeal Nasopharyngeal Swab     Status: None   Collection Time: 03/18/19  8:19 AM   Specimen: Nasopharyngeal Swab  Result Value Ref Range Status   SARS Coronavirus 2 NEGATIVE NEGATIVE Final    Comment: (NOTE) SARS-CoV-2 target nucleic acids are NOT DETECTED. The SARS-CoV-2 RNA is generally detectable in upper and lower respiratory specimens during the acute phase of infection. Negative results do not preclude SARS-CoV-2 infection, do not rule out co-infections with other pathogens, and should not be used as the sole basis for treatment or other patient management decisions. Negative results must be combined with clinical observations, patient history, and epidemiological information. The expected result is Negative. Fact Sheet for Patients: HairSlick.no Fact Sheet for Healthcare Providers: quierodirigir.com This test is not yet approved or cleared by the [place] FDA and  has been authorized for detection and/or diagnosis of SARS-CoV-2 by FDA under an Emergency Use Authorization (EUA). This EUA will remain  in effect (meaning this test can be used) for the duration of the COVID-19 declaration under Section 56 4(b)(1) of the Act, 21 U.S.C. section 360bbb-3(b)(1), unless the authorization is terminated or revoked sooner. Performed at [facility], [address], [place], [place] [address]   Cytomegalovirus (CMV) Culture     Status:  None   Collection Time: 03/19/19  6:27 PM   Specimen: Urine  Result Value Ref Range Status   Cytomegalovirus (CMV) Culture Comment  Final    Comment: (NOTE) Preliminary Report: No Cytomegalovirus isolated at 24 hours. Next report to follow after 1 week. Performed At: [facility] [address] [place], [place] [address] Jolene Schimke MD BS:[id number]    Source of Sample URINE, RANDOM  Final    Comment: Performed at [facility], [address] [place], [place] [address]  Group A Strep by PCR     Status: None   Collection Time: 03/19/19  7:15 PM  Result Value Ref Range Status   Group A Strep by PCR NOT DETECTED NOT DETECTED Final    Comment: Performed at [facility], [address], [place], [place] [address]   Time coordinating discharge: 35 minutes  SIGNED:  Merlene Laughter, DO [facility] 03/22/2019, 9:11 AM Pager is on AMION  If 7PM-7AM, please contact night-coverage [email or web address] Password [email or web address]

## 2019-03-22 NOTE — Progress Notes (Signed)
Patient discharged home. IV removed - WNL.  Educated on importance of cessation of alcohol use.  Hand outs given and patient verbalized other methods of focusing his attention to his nutrition.  Instructed to follow up with PCP and alcohol abuse resources, encouraged attending AA again as he has in the past.  Verbalizes understanding of DC instructions and meds.  Patient ambulated off unit with supervision in NAD.

## 2019-03-27 LAB — CYTOMEGALOVIRUS (CMV) CULTURE - CMVCUL

## 2019-06-21 ENCOUNTER — Encounter (HOSPITAL_COMMUNITY): Payer: Self-pay | Admitting: Emergency Medicine

## 2019-06-21 ENCOUNTER — Other Ambulatory Visit: Payer: Self-pay

## 2019-06-21 ENCOUNTER — Emergency Department (HOSPITAL_COMMUNITY)
Admission: EM | Admit: 2019-06-21 | Discharge: 2019-06-21 | Disposition: A | Discharge status: Home / Self Care | Payer: BC Managed Care – PPO | Attending: Emergency Medicine | Admitting: Emergency Medicine

## 2019-06-21 DIAGNOSIS — F1010 Alcohol abuse, uncomplicated: Secondary | ICD-10-CM | POA: Diagnosis not present

## 2019-06-21 DIAGNOSIS — Z79899 Other long term (current) drug therapy: Secondary | ICD-10-CM | POA: Insufficient documentation

## 2019-06-21 DIAGNOSIS — F10929 Alcohol use, unspecified with intoxication, unspecified: Secondary | ICD-10-CM | POA: Diagnosis present

## 2019-06-21 MED ORDER — CHLORDIAZEPOXIDE HCL 25 MG PO CAPS
50.0000 mg | ORAL_CAPSULE | Freq: Once | ORAL | Status: AC
  Administered 2019-06-21: 50 mg via ORAL
  Filled 2019-06-21: qty 2

## 2019-06-21 MED ORDER — CHLORDIAZEPOXIDE HCL 25 MG PO CAPS: ORAL_CAPSULE | ORAL | 0 refills | Status: DC

## 2019-06-21 NOTE — ED Notes (Signed)
Pt attempting to provide urine specimen at this time.  

## 2019-06-21 NOTE — ED Provider Notes (Addendum)
[place] DEPT Provider Note   CSN: [id number] Arrival date & time: 06/21/19  2111     History Chief Complaint  Patient presents with  . Alcohol Intoxication    Jermaine [name] is a 54 y.o. male.  The history is provided by the patient.  Alcohol Intoxication This is a chronic problem. The current episode started yesterday. The problem has been resolved. Pertinent negatives include no chest pain, no abdominal pain, no headaches and no shortness of breath. Associated symptoms comments: Patient wants detox for ETOH. . Nothing aggravates the symptoms. Nothing relieves the symptoms. He has tried nothing for the symptoms. The treatment provided no relief.       Past Medical History:  Diagnosis Date  . Alcohol abuse   . Anxiety   . Depression     Patient Active Problem List   Diagnosis Date Noted  . Anxiety and depression 11/20/2018  . Alcohol withdrawal ([place]) 11/19/2018  . Alcoholic cirrhosis of liver without ascites ([place]) 11/19/2018  . Alcohol abuse with intoxication ([place]) 11/16/2018  . Chronic alcohol abuse 11/16/2018  . Elevated liver enzymes 11/16/2018  . Thrombocytopenia ([place]) 11/16/2018  . Acute alcohol intoxication with alcoholism ([place]) 11/16/2018    Past Surgical History:  Procedure Laterality Date  . HERNIA REPAIR         Family History  Adopted: Yes    Social History   Tobacco Use  . Smoking status: Never Smoker  . Smokeless tobacco: Never Used  Substance Use Topics  . Alcohol use: Yes    Alcohol/week: 20.0 standard drinks    Types: 20 Standard drinks or equivalent per week    Comment: vodka seltzer  . Drug use: Not Currently    Home Medications Prior to Admission medications   Medication Sig Start Date End Date Taking? Authorizing Provider  escitalopram (LEXAPRO) 20 MG tablet Take 20 mg by mouth daily. 02/27/19  Yes [provider]  folic acid (FOLVITE) 1 MG tablet Take 1 tablet (1 mg total) by mouth  daily. 03/22/19  Yes Sheikh, Omair Latif, DO  hydrOXYzine (ATARAX/VISTARIL) 50 MG tablet Take 25-50 mg by mouth 2 (two) times daily as needed for anxiety. 02/11/19  Yes [provider]  Multiple Vitamin (MULTIVITAMIN WITH MINERALS) TABS tablet Take 1 tablet by mouth daily. 02/06/19  Yes Jani Gravel, MD  thiamine 100 MG tablet Take 1 tablet (100 mg total) by mouth daily. 03/22/19  Yes Sheikh, Omair Latif, DO  vitamin B-12 (CYANOCOBALAMIN) 1000 MCG tablet Take 2 tablets (2,000 mcg total) by mouth daily. 03/22/19  Yes Sheikh, Omair Latif, DO  chlordiazePOXIDE (LIBRIUM) 25 MG capsule 50mg  PO TID x 1D, then 25 mg PO BID X 1D, then 25 mg PO QD X 1D 06/21/19   , , DO    Allergies    Patient has no known allergies.  Review of Systems   Review of Systems  Constitutional: Negative for chills and fever.  HENT: Negative for ear pain and sore throat.   Eyes: Negative for pain and visual disturbance.  Respiratory: Negative for cough and shortness of breath.   Cardiovascular: Negative for chest pain and palpitations.  Gastrointestinal: Negative for abdominal pain and vomiting.  Genitourinary: Negative for dysuria and hematuria.  Musculoskeletal: Negative for arthralgias and back pain.  Skin: Negative for color change and rash.  Neurological: Negative for seizures, syncope and headaches.  All other systems reviewed and are negative.   Physical Exam Updated Vital Signs BP 130/85   Pulse Marland Kitchen)  109   Temp 98.7 F (37.1 C) (Oral)   Resp (!) 22   Ht 5\' 10"  (1.778 m)   Wt 90.7 kg   SpO2 100%   BMI 28.70 kg/m   Physical Exam Vitals and nursing note reviewed.  Constitutional:      General: He is not in acute distress.    Appearance: He is well-developed. He is not ill-appearing.  HENT:     Head: Normocephalic and atraumatic.     Nose: Nose normal.     Mouth/Throat:     Mouth: Mucous membranes are moist.  Eyes:     Extraocular Movements: Extraocular movements intact.      Conjunctiva/sclera: Conjunctivae normal.     Pupils: Pupils are equal, round, and reactive to light.  Cardiovascular:     Rate and Rhythm: Normal rate and regular rhythm.     Pulses: Normal pulses.     Heart sounds: Normal heart sounds. No murmur.  Pulmonary:     Effort: Pulmonary effort is normal. No respiratory distress.     Breath sounds: Normal breath sounds.  Abdominal:     Palpations: Abdomen is soft.     Tenderness: There is no abdominal tenderness.  Musculoskeletal:     Cervical back: Neck supple.  Skin:    General: Skin is warm and dry.  Neurological:     General: No focal deficit present.     Mental Status: He is alert.  Psychiatric:        Mood and Affect: Mood normal.        Behavior: Behavior normal.        Thought Content: Thought content normal.        Judgment: Judgment normal.     ED Results / Procedures / Treatments   Labs (all labs ordered are listed, but only abnormal results are displayed) Labs Reviewed - No data to display  EKG None  Radiology No results found.  Procedures Procedures (including critical care time)  Medications Ordered in ED Medications  chlordiazePOXIDE (LIBRIUM) capsule 50 mg (has no administration in time range)    ED Course  I have reviewed the triage vital signs and the nursing notes.  Pertinent labs & imaging results that were available during my care of the patient were reviewed by me and considered in my medical decision making (see chart for details).    MDM Rules/Calculators/A&P                      Jermaine [name] is a 54 year old male with history of alcohol abuse who presents to the ED wanting alcohol detox.  Patient with unremarkable vitals.  No active withdrawal symptoms.  Recently has been on an alcohol binge.  Was sober for 2-1/2 years.  Will give Librium.  Actively he is sober and mentating well.  He is motivated to detox.  Given resources for rehab and discharged from ED in good condition.  Given return  precautions.  This chart was dictated using voice recognition software.  Despite best efforts to proofread,  errors can occur which can change the documentation meaning.   Final Clinical Impression(s) / ED Diagnoses Final diagnoses:  Alcohol abuse    Rx / DC Orders ED Discharge Orders         Ordered    chlordiazePOXIDE (LIBRIUM) 25 MG capsule     06/21/19 2235           2236, DO 06/21/19 2241    06/23/19,  DO 06/21/19 2242

## 2019-06-21 NOTE — ED Triage Notes (Signed)
Pt reports feeling like he is going through detox from alcohol. Pt states last drink 10 hours ago which was vodka. Pt reports drinking 2 gallons a day.

## 2019-06-21 NOTE — ED Notes (Signed)
Pt unable to obtain urine specimen at this time 

## 2019-07-01 ENCOUNTER — Ambulatory Visit (HOSPITAL_COMMUNITY)
Admission: RE | Admit: 2019-07-01 | Discharge: 2019-07-01 | Disposition: A | Discharge status: Home / Self Care | Payer: BC Managed Care – PPO | Attending: Psychiatry | Admitting: Psychiatry

## 2019-07-01 ENCOUNTER — Encounter (HOSPITAL_COMMUNITY): Payer: Self-pay | Admitting: Psychiatry

## 2019-07-01 DIAGNOSIS — F1020 Alcohol dependence, uncomplicated: Secondary | ICD-10-CM | POA: Insufficient documentation

## 2019-07-01 NOTE — BH Assessment (Signed)
Assessment Note  Jermaine [name] is an 54 y.o. male who presents with the police as a walk-in after his sister called for a wellfare check and found him intoxicated and depressed.  Patient states that he has an alcohol problem.  He states that he was sober for two years and forty-one days after completing the program at [facility] three years ago, but he states that he relapsed during the Pandemic because he could not attend meetings.  Patient states that he has been drinking on binges on his days off from work and he states that he consumes 1 to 1.5 bottles of Tequila daily on his binges. He states that his last drink was earlier today.  Patient states that he has never been suicidal, homicidal or psychotic and denies that he has ever been in a mental health hospital and states that he has no outpatient provider.  He states that he tried to get help last week for detox at [facility], but they did not have a bed for him due to the number of Covid patients.  Patient states that he knows how to work the program of AA, but states that he does not need further treatment, just detox.  He states that when he is withdrawing from alcohol that he sees insects that are not really there.  Patient states that he is not eating well and states that he only sleeps five hours per night.  He denies any history of abuse or self-mutilation.  Patient presented as mildly intoxicated, but pleasant.  He was oriented and alert.  His judgment, insight and impulse control are impaired by his drinking.  He does not appear to be responding to any internal stimuli.  His thoughts were a little loose due to his intoxication, but his memory was intact.  His speech was coherent, he was jovial and his eye contact was good.  Diagnosis: F10.20 Alcohol Use Disorder Severe  Past Medical History:  Past Medical History:  Diagnosis Date  . Alcohol abuse   . Anxiety   . Depression     Past Surgical History:  Procedure Laterality Date   . HERNIA REPAIR      Family History:  Family History  Adopted: Yes    Social History:  reports that he has never smoked. He has never used smokeless tobacco. He reports current alcohol use of about 20.0 standard drinks of alcohol per week. He reports previous drug use.  Additional Social History:  Alcohol / Drug Use Pain Medications: see MAR Prescriptions: see MAR Over the Counter: see MAR History of alcohol / drug use?: Yes Longest period of sobriety (when/how long): 2 yeras and 41 days Negative Consequences of Use: Financial, Personal relationships, Work / Automotive engineer #1 Name of Substance 1: alcohol 1 - Age of First Use: UTA 1 - Amount (size/oz): fifth 1 - Frequency: 2-3 times weekly 1 - Duration: since pandemic, was sober for over 2 years 1 - Last Use / Amount: today  CIWA:   COWS:    Allergies: No Known Allergies  Home Medications: (Not in a hospital admission)   OB/GYN Status:  No LMP for male patient.  General Assessment Data Location of Assessment: [facility] Assessment Services TTS Assessment: In system Is this a Tele or Face-to-Face Assessment?: Face-to-Face Is this an Initial Assessment or a Re-assessment for this encounter?: Initial Assessment Patient Accompanied by:: Other(police) Language Other than English: No Living Arrangements: Other (Comment)(lives alone) What gender do you identify as?: Male Marital status: Single  Living Arrangements: Alone Can pt return to current living arrangement?: Yes Admission Status: Voluntary Is patient capable of signing voluntary admission?: Yes Referral Source: Self/Family/Friend Insurance type: [place]  Medical Screening Exam [facility] Walk-in ONLY) Medical Exam completed: Yes  Crisis Care Plan Living Arrangements: Alone Legal Guardian: Other:(self) Name of Psychiatrist: none Name of Therapist: none  Education Status Is patient currently in school?: No Is the patient employed, unemployed or receiving disability?:  Employed  Risk to self with the past 6 months Suicidal Ideation: No Has patient been a risk to self within the past 6 months prior to admission? : No Suicidal Intent: No Has patient had any suicidal intent within the past 6 months prior to admission? : No Is patient at risk for suicide?: No Suicidal Plan?: No Has patient had any suicidal plan within the past 6 months prior to admission? : No Access to Means: No What has been your use of drugs/alcohol within the last 12 months?: 3 x wk, alcohol Previous Attempts/Gestures: No How many times?: 0 Other Self Harm Risks: drinking Triggers for Past Attempts: None known Intentional Self Injurious Behavior: None Family Suicide History: No Recent stressful life event(s): Other (Comment)(pandemic) Persecutory voices/beliefs?: No Depression: Yes Depression Symptoms: Despondent, Insomnia, Isolating, Loss of interest in usual pleasures Substance abuse history and/or treatment for substance abuse?: Yes Suicide prevention information given to non-admitted patients: Not applicable  Risk to Others within the past 6 months Homicidal Ideation: No Does patient have any lifetime risk of violence toward others beyond the six months prior to admission? : No Thoughts of Harm to Others: No Current Homicidal Intent: No Current Homicidal Plan: No Access to Homicidal Means: No Identified Victim: none History of harm to others?: No Assessment of Violence: None Noted Violent Behavior Description: none Does patient have access to weapons?: No Criminal Charges Pending?: No Does patient have a court date: No Is patient on probation?: No  Psychosis Hallucinations: None noted Delusions: None noted  Mental Status Report Appearance/Hygiene: Disheveled Eye Contact: Good Motor Activity: Freedom of movement Speech: Logical/coherent Level of Consciousness: Alert Mood: Depressed, Anxious Affect: Appropriate to circumstance Anxiety Level: Moderate Thought  Processes: Coherent, Relevant Judgement: Partial Orientation: Person, Place, Time, Situation Obsessive Compulsive Thoughts/Behaviors: Severe(drinking)  Cognitive Functioning Concentration: Normal Memory: Recent Intact, Remote Intact Is patient IDD: No Insight: Fair Impulse Control: Poor Appetite: Fair Have you had any weight changes? : No Change Sleep: Decreased Total Hours of Sleep: 5 Vegetative Symptoms: None  ADLScreening [facility] Assessment Services) Patient's cognitive ability adequate to safely complete daily activities?: Yes Patient able to express need for assistance with ADLs?: Yes Independently performs ADLs?: Yes (appropriate for developmental age)  Prior Inpatient Therapy Prior Inpatient Therapy: Yes Prior Therapy Dates: 3 yrs ago Prior [facility]ilty[facility]Provider[facility]): [facility] Reason for Treatment: ETOH  Prior Outpatient Therapy Prior Outpatient Therapy: No Does patient have an ACCT team?: No Does patient have Intensive In-House Services?  : No Does patient have [facility] services? : No Does patient have P4CC services?: No  ADL Screening (condition at time of admission) Patient's cognitive ability adequate to safely complete daily activities?: Yes Is the patient deaf or have difficulty hearing?: No Does the patient have difficulty seeing, even when wearing glasses/contacts?: No Does the patient have difficulty concentrating, remembering, or making decisions?: No Patient able to express need for assistance with ADLs?: Yes Does the patient have difficulty dressing or bathing?: No Independently performs ADLs?: Yes (appropriate for developmental age) Does the patient have difficulty walking or climbing stairs?:  No Weakness of Legs: None  Home Assistive Devices/Equipment Home Assistive Devices/Equipment: None  Therapy Consults (therapy consults require a physician order) PT Evaluation Needed: No OT Evalulation Needed: No SLP Evaluation Needed:  No Abuse/Neglect Assessment (Assessment to be complete while patient is alone) Abuse/Neglect Assessment Can Be Completed: Yes Physical Abuse: Denies Verbal Abuse: Denies Sexual Abuse: Denies Exploitation of patient/patient's resources: Denies Self-Neglect: Denies Values / Beliefs Cultural Requests During Hospitalization: None Spiritual Requests During Hospitalization: None Consults Spiritual Care Consult Needed: No Transition of Care Team Consult Needed: No Advance Directives (For Healthcare) Does Patient Have a Medical Advance Directive?: No Would patient like information on creating a medical advance directive?: No - Patient declined Nutrition Screen- [facility] Adult/[facility]/AP Has the patient recently lost weight without trying?: No Has the patient been eating poorly because of a decreased appetite?: No Malnutrition Screening Tool Score: 0        Disposition: Per Denzil Magnuson, NP, patient does not meet inpatient admission criteria. Disposition Initial Assessment Completed for this Encounter: Yes Patient referred to: [facility]  On Site Evaluation by:   Reviewed with Physician:    Arnoldo Lenis  07/01/2019 6:50 PM

## 2019-07-01 NOTE — H&P (Signed)
Behavioral Health Medical Screening Exam  Jermaine [name] is an 54 y.o. male.who presents to [facility] voluntarily. Patient is requesting detox. He reports a history of alcohol dependency. Reports sobriety for two years although reports since COVID, he relapsed. Reports for the last several days, he has binged on alcohol drinking at least a bottle and a half a tequila. He reports today, he drank half of bottle. He reports he has lived in a sober living facility  n he past  Reports he does not currently participate in any outpatient substance abuse programs. He denies other substance abuse or use. He denies active or passive suicidal thoughts, homicidal thoughts, or psychosis. Denies prior suicide attempts or self-harming behaviors. Reports he was recently evaluated at [facility] hospital and discharged on Librium. Per chart review this was 06/21/2019. We discussed resources for substance abuse and he stated," I prefer that my job help me with this and I do onto want to be admitted to a psychiatric ward."   Total Time spent with patient: 20 minutes  Psychiatric Specialty Exam: Physical Exam  Vitals reviewed. Constitutional: He is oriented to person, place, and time.  Neurological: He is alert and oriented to person, place, and time.    Review of Systems  All other systems reviewed and are negative.   There were no vitals taken for this visit.There is no height or weight on file to calculate BMI.  General Appearance: Fairly Groomed  Eye Contact:  Good  Speech:  Slurred  Volume:  Normal  Mood:  Euthymic  Affect:  Appropriate  Thought Process:  Coherent, Linear and Descriptions of Associations: Intact  Orientation:  Full (Time, Place, and Person)  Thought Content:  Logical  Suicidal Thoughts:  No  Homicidal Thoughts:  No  Memory:  Immediate;   Fair Recent;   Fair  Judgement:  Fair  Insight:  Shallow  Psychomotor Activity:  Normal  Concentration: Concentration: Fair and Attention Span: Fair  Recall:  [employer] of Knowledge:Fair  Language: Good  Akathisia:  Negative  Handed:  Right  AIMS (if indicated):     Assets:  Communication Skills Desire for Improvement Resilience  Sleep:       Musculoskeletal: Strength & Muscle Tone: within normal limits Gait & Station: normal Patient leans: N/A  There were no vitals taken for this visit.  Recommendations:  Based on my evaluation the patient does not appear to have an emergency medical condition.  No evidence of imminent risk to self or others at present.   Patient does not meet criteria for psychiatric inpatient admission. Although patient stated he would rather his job assist him with resources for his alcohol dependency, resources were still provided and he was highly encourage to follow-up with list.   Denzil Magnuson, NP 07/01/2019, 5:17 PM

## 2021-07-01 ENCOUNTER — Inpatient Hospital Stay (HOSPITAL_COMMUNITY)
Admission: EM | Admit: 2021-07-01 | Discharge: 2021-07-05 | DRG: 897 | Disposition: A | Discharge status: Home / Self Care | Payer: BC Managed Care – PPO | Attending: Internal Medicine | Admitting: Internal Medicine

## 2021-07-01 ENCOUNTER — Encounter (HOSPITAL_COMMUNITY): Payer: Self-pay

## 2021-07-01 ENCOUNTER — Emergency Department (HOSPITAL_COMMUNITY): Payer: BC Managed Care – PPO

## 2021-07-01 ENCOUNTER — Other Ambulatory Visit: Payer: Self-pay

## 2021-07-01 DIAGNOSIS — R112 Nausea with vomiting, unspecified: Secondary | ICD-10-CM | POA: Diagnosis not present

## 2021-07-01 DIAGNOSIS — G9341 Metabolic encephalopathy: Secondary | ICD-10-CM | POA: Diagnosis present

## 2021-07-01 DIAGNOSIS — F10239 Alcohol dependence with withdrawal, unspecified: Principal | ICD-10-CM | POA: Diagnosis present

## 2021-07-01 DIAGNOSIS — F419 Anxiety disorder, unspecified: Secondary | ICD-10-CM | POA: Diagnosis present

## 2021-07-01 DIAGNOSIS — K7030 Alcoholic cirrhosis of liver without ascites: Secondary | ICD-10-CM | POA: Diagnosis present

## 2021-07-01 DIAGNOSIS — F32A Depression, unspecified: Secondary | ICD-10-CM | POA: Diagnosis present

## 2021-07-01 DIAGNOSIS — D72828 Other elevated white blood cell count: Secondary | ICD-10-CM | POA: Diagnosis present

## 2021-07-01 DIAGNOSIS — K760 Fatty (change of) liver, not elsewhere classified: Secondary | ICD-10-CM | POA: Diagnosis present

## 2021-07-01 DIAGNOSIS — Z20822 Contact with and (suspected) exposure to covid-19: Secondary | ICD-10-CM | POA: Diagnosis present

## 2021-07-01 DIAGNOSIS — E860 Dehydration: Secondary | ICD-10-CM | POA: Diagnosis present

## 2021-07-01 DIAGNOSIS — E876 Hypokalemia: Secondary | ICD-10-CM | POA: Diagnosis present

## 2021-07-01 DIAGNOSIS — K2920 Alcoholic gastritis without bleeding: Secondary | ICD-10-CM | POA: Diagnosis present

## 2021-07-01 DIAGNOSIS — R000 Tachycardia, unspecified: Secondary | ICD-10-CM | POA: Diagnosis not present

## 2021-07-01 DIAGNOSIS — F10932 Alcohol use, unspecified with withdrawal with perceptual disturbance: Secondary | ICD-10-CM | POA: Diagnosis not present

## 2021-07-01 DIAGNOSIS — F10939 Alcohol use, unspecified with withdrawal, unspecified: Principal | ICD-10-CM | POA: Diagnosis present

## 2021-07-01 DIAGNOSIS — K219 Gastro-esophageal reflux disease without esophagitis: Secondary | ICD-10-CM | POA: Diagnosis present

## 2021-07-01 LAB — URINALYSIS, ROUTINE W REFLEX MICROSCOPIC
Bacteria, UA: NONE SEEN
Bilirubin Urine: NEGATIVE
Glucose, UA: NEGATIVE mg/dL
Hgb urine dipstick: NEGATIVE
Ketones, ur: 80 mg/dL — AB
Leukocytes,Ua: NEGATIVE
Nitrite: NEGATIVE
Protein, ur: NEGATIVE mg/dL
Specific Gravity, Urine: 1.01 (ref 1.005–1.030)
pH: 6 (ref 5.0–8.0)

## 2021-07-01 LAB — CBC WITH DIFFERENTIAL/PLATELET
Abs Immature Granulocytes: 0.05 10*3/uL (ref 0.00–0.07)
Basophils Absolute: 0.1 10*3/uL (ref 0.0–0.1)
Basophils Relative: 1 %
Eosinophils Absolute: 0 10*3/uL (ref 0.0–0.5)
Eosinophils Relative: 0 %
HCT: 49.6 % (ref 39.0–52.0)
Hemoglobin: 16.7 g/dL (ref 13.0–17.0)
Immature Granulocytes: 0 %
Lymphocytes Relative: 10 %
Lymphs Abs: 1.6 10*3/uL (ref 0.7–4.0)
MCH: 31.7 pg (ref 26.0–34.0)
MCHC: 33.7 g/dL (ref 30.0–36.0)
MCV: 94.3 fL (ref 80.0–100.0)
Monocytes Absolute: 0.6 10*3/uL (ref 0.1–1.0)
Monocytes Relative: 4 %
Neutro Abs: 12.7 10*3/uL — ABNORMAL HIGH (ref 1.7–7.7)
Neutrophils Relative %: 85 %
Platelets: 323 10*3/uL (ref 150–400)
RBC: 5.26 MIL/uL (ref 4.22–5.81)
RDW: 13.2 % (ref 11.5–15.5)
WBC: 15 10*3/uL — ABNORMAL HIGH (ref 4.0–10.5)
nRBC: 0 % (ref 0.0–0.2)

## 2021-07-01 LAB — COMPREHENSIVE METABOLIC PANEL
ALT: 33 U/L (ref 0–44)
AST: 38 U/L (ref 15–41)
Albumin: 4.6 g/dL (ref 3.5–5.0)
Alkaline Phosphatase: 56 U/L (ref 38–126)
Anion gap: 21 — ABNORMAL HIGH (ref 5–15)
BUN: 17 mg/dL (ref 6–20)
CO2: 22 mmol/L (ref 22–32)
Calcium: 8.6 mg/dL — ABNORMAL LOW (ref 8.9–10.3)
Chloride: 93 mmol/L — ABNORMAL LOW (ref 98–111)
Creatinine, Ser: 1.1 mg/dL (ref 0.61–1.24)
GFR, Estimated: >60 mL/min (ref >60)
Glucose, Bld: 86 mg/dL (ref 70–99)
Potassium: 3.2 mmol/L — ABNORMAL LOW (ref 3.5–5.1)
Sodium: 136 mmol/L (ref 135–145)
Total Bilirubin: 0.8 mg/dL (ref 0.3–1.2)
Total Protein: 7.5 g/dL (ref 6.5–8.1)

## 2021-07-01 LAB — TROPONIN I (HIGH SENSITIVITY)
Troponin I (High Sensitivity): 10 ng/L (ref <18)
Troponin I (High Sensitivity): 10 ng/L (ref <18)

## 2021-07-01 LAB — AMMONIA: Ammonia: 17 umol/L (ref 9–35)

## 2021-07-01 LAB — RAPID URINE DRUG SCREEN, HOSP PERFORMED
Amphetamines: NOT DETECTED
Barbiturates: NOT DETECTED
Benzodiazepines: NOT DETECTED
Cocaine: NOT DETECTED
Opiates: NOT DETECTED
Tetrahydrocannabinol: NOT DETECTED

## 2021-07-01 LAB — MAGNESIUM: Magnesium: 1.3 mg/dL — ABNORMAL LOW (ref 1.7–2.4)

## 2021-07-01 LAB — RESP PANEL BY RT-PCR (FLU A&B, COVID) ARPGX2
Influenza A by PCR: NEGATIVE
Influenza B by PCR: NEGATIVE
SARS Coronavirus 2 by RT PCR: NEGATIVE

## 2021-07-01 LAB — ETHANOL: Alcohol, Ethyl (B): 24 mg/dL — ABNORMAL HIGH (ref <10)

## 2021-07-01 MED ORDER — ONDANSETRON HCL 4 MG PO TABS: 4.0000 mg | ORAL_TABLET | Freq: Four times a day (QID) | ORAL | Status: DC | PRN

## 2021-07-01 MED ORDER — LACTATED RINGERS IV BOLUS
1000.0000 mL | Freq: Once | INTRAVENOUS | Status: AC
  Administered 2021-07-01: 1000 mL via INTRAVENOUS

## 2021-07-01 MED ORDER — ACETAMINOPHEN 325 MG PO TABS
650.0000 mg | ORAL_TABLET | Freq: Four times a day (QID) | ORAL | Status: DC | PRN
  Administered 2021-07-01 – 2021-07-04 (×4): 650 mg via ORAL
  Filled 2021-07-01 (×4): qty 2

## 2021-07-01 MED ORDER — THIAMINE HCL 100 MG PO TABS: 100.0000 mg | ORAL_TABLET | Freq: Every day | ORAL | Status: DC

## 2021-07-01 MED ORDER — DIAZEPAM 5 MG/ML IJ SOLN
10.0000 mg | Freq: Once | INTRAMUSCULAR | Status: AC
  Administered 2021-07-01: 10 mg via INTRAVENOUS
  Filled 2021-07-01: qty 2

## 2021-07-01 MED ORDER — POTASSIUM CHLORIDE CRYS ER 20 MEQ PO TBCR
40.0000 meq | EXTENDED_RELEASE_TABLET | Freq: Once | ORAL | Status: AC
  Administered 2021-07-01: 40 meq via ORAL
  Filled 2021-07-01: qty 2

## 2021-07-01 MED ORDER — LORAZEPAM 1 MG PO TABS: 1.0000 mg | ORAL_TABLET | Freq: Once | ORAL | Status: DC

## 2021-07-01 MED ORDER — ACETAMINOPHEN 650 MG RE SUPP: 650.0000 mg | Freq: Four times a day (QID) | RECTAL | Status: DC | PRN

## 2021-07-01 MED ORDER — LORAZEPAM 2 MG/ML IJ SOLN
1.0000 mg | INTRAMUSCULAR | Status: AC | PRN
  Administered 2021-07-02 – 2021-07-03 (×4): 2 mg via INTRAVENOUS
  Administered 2021-07-03 (×2): 3 mg via INTRAVENOUS
  Administered 2021-07-04 (×4): 2 mg via INTRAVENOUS
  Filled 2021-07-01: qty 1
  Filled 2021-07-01 (×2): qty 2
  Filled 2021-07-01 (×6): qty 1
  Filled 2021-07-01: qty 2
  Filled 2021-07-01: qty 1
  Filled 2021-07-01: qty 2

## 2021-07-01 MED ORDER — LACTATED RINGERS IV SOLN: INTRAVENOUS | Status: DC

## 2021-07-01 MED ORDER — THIAMINE HCL 100 MG/ML IJ SOLN
100.0000 mg | Freq: Every day | INTRAMUSCULAR | Status: DC
  Filled 2021-07-01: qty 2

## 2021-07-01 MED ORDER — DIAZEPAM 5 MG/ML IJ SOLN
5.0000 mg | Freq: Once | INTRAMUSCULAR | Status: AC
  Administered 2021-07-01: 5 mg via INTRAVENOUS
  Filled 2021-07-01: qty 2

## 2021-07-01 MED ORDER — LORAZEPAM 1 MG PO TABS
1.0000 mg | ORAL_TABLET | ORAL | Status: AC | PRN
  Administered 2021-07-01: 2 mg via ORAL
  Filled 2021-07-01: qty 2

## 2021-07-01 MED ORDER — ADULT MULTIVITAMIN W/MINERALS CH
1.0000 | ORAL_TABLET | Freq: Every day | ORAL | Status: DC
  Administered 2021-07-01 – 2021-07-05 (×5): 1 via ORAL
  Filled 2021-07-01 (×5): qty 1

## 2021-07-01 MED ORDER — THIAMINE HCL 100 MG PO TABS
100.0000 mg | ORAL_TABLET | Freq: Every day | ORAL | Status: DC
  Administered 2021-07-02 – 2021-07-05 (×4): 100 mg via ORAL
  Filled 2021-07-01 (×4): qty 1

## 2021-07-01 MED ORDER — THIAMINE HCL 100 MG/ML IJ SOLN
100.0000 mg | Freq: Every day | INTRAMUSCULAR | Status: DC
  Administered 2021-07-01: 100 mg via INTRAVENOUS
  Filled 2021-07-01: qty 2

## 2021-07-01 MED ORDER — LORAZEPAM 2 MG/ML IJ SOLN
0.0000 mg | Freq: Two times a day (BID) | INTRAMUSCULAR | Status: DC
  Administered 2021-07-03 – 2021-07-04 (×2): 2 mg via INTRAVENOUS
  Administered 2021-07-04: 1 mg via INTRAVENOUS
  Filled 2021-07-01 (×3): qty 1

## 2021-07-01 MED ORDER — LORAZEPAM 2 MG/ML IJ SOLN
0.0000 mg | Freq: Four times a day (QID) | INTRAMUSCULAR | Status: AC
  Administered 2021-07-01 – 2021-07-02 (×4): 2 mg via INTRAVENOUS
  Administered 2021-07-02: 3 mg via INTRAVENOUS
  Administered 2021-07-03: 2 mg via INTRAVENOUS
  Administered 2021-07-03: 1 mg via INTRAVENOUS
  Administered 2021-07-03: 3 mg via INTRAVENOUS
  Filled 2021-07-01 (×5): qty 1
  Filled 2021-07-01: qty 2

## 2021-07-01 MED ORDER — LORAZEPAM 2 MG/ML IJ SOLN
1.0000 mg | Freq: Once | INTRAMUSCULAR | Status: AC
  Administered 2021-07-01: 1 mg via INTRAVENOUS
  Filled 2021-07-01: qty 1

## 2021-07-01 MED ORDER — ONDANSETRON HCL 4 MG/2ML IJ SOLN: 4.0000 mg | Freq: Four times a day (QID) | INTRAMUSCULAR | Status: DC | PRN

## 2021-07-01 MED ORDER — IOHEXOL 350 MG/ML SOLN
80.0000 mL | Freq: Once | INTRAVENOUS | Status: AC | PRN
  Administered 2021-07-01: 80 mL via INTRAVENOUS

## 2021-07-01 MED ORDER — FOLIC ACID 1 MG PO TABS
1.0000 mg | ORAL_TABLET | Freq: Every day | ORAL | Status: DC
  Administered 2021-07-01 – 2021-07-05 (×5): 1 mg via ORAL
  Filled 2021-07-01 (×5): qty 1

## 2021-07-01 MED ORDER — THIAMINE HCL 100 MG/ML IJ SOLN: 100.0000 mg | Freq: Every day | INTRAMUSCULAR | Status: DC

## 2021-07-01 NOTE — Assessment & Plan Note (Addendum)
Patient received CIWA protocol with Ativan during hospitalization with thiamine folic acid and supportive care.  Patient has felt much better at this time without any hallucinations tremors.  He feels like he is ready for discharge home and understands that he has resources to help him after his discharge.

## 2021-07-01 NOTE — ED Provider Notes (Signed)
[facility] DEPT Provider Note   CSN: [id number] Arrival date & time: 07/01/21  1320     History  Chief Complaint  Patient presents with   Chest Pain    Jermaine [name] is a 56 y.o. male with history of alcohol abuse who presents to the ED for evaluation of sudden onset facial and bilateral hand numbness as well as chest pain that occurred while he was resting at home.  Pain is described as sharp, substernal and does not radiate.  The entire episode lasted approximately 30 minutes before spontaneous resolution.  No alleviating or aggravating factors.  Patient states numbness lasted for approximately 45 minutes to an hour before resolving.  He has no previous history of cardiac disease, blood clots.  He does note that he drank about 1/5 of alcohol last night.  He has had several episodes of vomiting this morning followed by the sharp chest pain and numbness.  Currently he endorses bilateral hand tremors, nausea.    Chest Pain Associated symptoms: no abdominal pain, no fever, no headache, no shortness of breath and no vomiting       Home Medications Prior to Admission medications   Medication Sig Start Date End Date Taking? Authorizing Provider  chlordiazePOXIDE (LIBRIUM) 25 MG capsule 50mg  PO TID x 1D, then 25 mg PO BID X 1D, then 25 mg PO QD X 1D 06/21/19   Curatolo, Adam, DO  escitalopram (LEXAPRO) 20 MG tablet Take 20 mg by mouth daily. 02/27/19   [provider]  folic acid (FOLVITE) 1 MG tablet Take 1 tablet (1 mg total) by mouth daily. 03/22/19   Raiford Noble Latif, DO  hydrOXYzine (ATARAX/VISTARIL) 50 MG tablet Take 25-50 mg by mouth 2 (two) times daily as needed for anxiety. 02/11/19   [provider]  Multiple Vitamin (MULTIVITAMIN WITH MINERALS) TABS tablet Take 1 tablet by mouth daily. 02/06/19   Jani Gravel, MD  thiamine 100 MG tablet Take 1 tablet (100 mg total) by mouth daily. 03/22/19   Raiford Noble Latif, DO  vitamin B-12  (CYANOCOBALAMIN) 1000 MCG tablet Take 2 tablets (2,000 mcg total) by mouth daily. 03/22/19   Kerney Elbe, DO      Allergies    Patient has no known allergies.    Review of Systems   Review of Systems  Constitutional:  Negative for fever.  HENT: Negative.    Eyes: Negative.   Respiratory:  Negative for shortness of breath.   Cardiovascular:  Positive for chest pain.  Gastrointestinal:  Negative for abdominal pain and vomiting.  Endocrine: Negative.   Genitourinary: Negative.   Musculoskeletal: Negative.   Skin:  Negative for rash.  Neurological:  Positive for tremors. Negative for headaches.  All other systems reviewed and are negative.  Physical Exam Updated Vital Signs BP (!) 152/94 (BP Location: Left Arm)    Pulse (!) 130    Temp 98.4 F (36.9 C) (Oral)    Resp 19    Ht 5\' 10"  (1.778 m)    Wt 99.8 kg    SpO2 94%    BMI 31.57 kg/m  Physical Exam Vitals and nursing note reviewed.  Constitutional:      General: He is not in acute distress.    Appearance: He is diaphoretic. He is not ill-appearing.  HENT:     Head: Atraumatic.  Eyes:     Conjunctiva/sclera: Conjunctivae normal.  Cardiovascular:     Rate and Rhythm: Regular rhythm. Tachycardia present.  Pulses: Normal pulses.          Radial pulses are 2+ on the right side and 2+ on the left side.       Dorsalis pedis pulses are 2+ on the right side and 2+ on the left side.     Heart sounds: No murmur heard. Pulmonary:     Effort: Pulmonary effort is normal. No respiratory distress.     Breath sounds: Normal breath sounds.  Abdominal:     General: Abdomen is flat. There is no distension.     Palpations: Abdomen is soft.     Tenderness: There is no abdominal tenderness.  Musculoskeletal:        General: Normal range of motion.     Cervical back: Normal range of motion.  Skin:    General: Skin is warm.     Capillary Refill: Capillary refill takes less than 2 seconds.  Neurological:     General: No focal  deficit present.     Mental Status: He is alert.     Comments: Speech is clear, able to follow commands CN III-XII intact Normal strength in upper and lower extremities bilaterally including dorsiflexion and plantar flexion, strong and equal grip strength Sensation normal to light and sharp touch Moves extremities without ataxia, coordination intact Normal finger to nose and rapid alternating movements No pronator drift  Patient does have fine bilateral hand tremors  Psychiatric:        Mood and Affect: Mood normal.    ED Results / Procedures / Treatments   Labs (all labs ordered are listed, but only abnormal results are displayed) Labs Reviewed  COMPREHENSIVE METABOLIC PANEL  CBC WITH DIFFERENTIAL/PLATELET  AMMONIA  ETHANOL  URINALYSIS, ROUTINE W REFLEX MICROSCOPIC  RAPID URINE DRUG SCREEN, HOSP PERFORMED  D-DIMER, QUANTITATIVE  TROPONIN I (HIGH SENSITIVITY)    EKG None  Radiology No results found.  Procedures Procedures  Medications Ordered in ED Medications  lactated ringers bolus 1,000 mL (has no administration in time range)    ED Course/ Medical Decision Making/ A&P                           Medical Decision Making Amount and/or Complexity of Data Reviewed Labs: ordered. Radiology: ordered.  Risk Prescription drug management. Decision regarding hospitalization.   History:  Per HPI This patient presents to the ED for concern of chest pain, this involves an extensive number of treatment options, and is a complaint that carries with it a high risk of complications and morbidity.   The emergent differential diagnosis of chest pain includes: Acute coronary syndrome, pericarditis, aortic dissection, pulmonary embolism, tension pneumothorax, and esophageal rupture. I do not believe the patient has an emergent cause of chest pain, other urgent/non-acute considerations include, but are not limited to: chronic angina, aortic stenosis, cardiomyopathy, myocarditis,  mitral valve prolapse, pulmonary hypertension, hypertrophic obstructive cardiomyopathy (HOCM), aortic insufficiency, right ventricular hypertrophy, pneumonia, pleuritis, bronchitis, pneumothorax, tumor, gastroesophageal reflux disease (GERD), esophageal spasm, Mallory-Weiss syndrome, peptic ulcer disease, biliary disease, pancreatitis, functional gastrointestinal pain, cervical or thoracic disk disease or arthritis, shoulder arthritis, costochondritis, subacromial bursitis, anxiety or panic attack, herpes zoster, breast disorders, chest wall tumors, thoracic outlet syndrome, mediastinitis.  Initial impression:  Patient is lying in bed, nontoxic although appears distressed with bilateral hand tremors. Tachycardic to the 130s. Physical exam was otherwise reassuring. I suspect patient's chest pain and tachycardia are from alcohol intoxication, but his story is concerning enough to  warrant CTA. Will also obtain Troponin, ethanol, UDS, CMP, ammonia, CBC, chest xray Patient with worsening tremors. I will order 2mg  Ativan and LR bolus while pending imaging and labs  Tremors and skin itching returned; patient given 5mg  Valium with improvement.  About one hour later, patient had return of symptoms. CIWA score of 12. Another 10mg  Valium given  Pt again had temporary improvement but withdrawal symptoms returned once again. Pt likely requires admission for managment  Lab Tests and EKG:  I Ordered, reviewed, and interpreted labs and EKG.   CBC with leukocytosis of 15 CMP with mild hypokalemia 3.2 Ammonia levels normal, troponin normal, ethanol level slightly elevated UDS negative Urinalysis unremarkable  Imaging I ordered 2 view chest x-ray and CTA angio which shows no cardiomegaly, no pneumonia and negative for pulmonary embolism.  I reviewed and interpreted all imaging myself and agree with radiologist interpretation.  Cardiac Monitoring:  The patient was maintained on a cardiac monitor.  I personally  viewed and interpreted the cardiac monitored which showed an underlying rhythm of: sinus tach   Medicines ordered and prescription drug management:  I ordered medication including: Ativan 2mg   for tremors LR 2L bolus for tachycardia and dehydration  15mg  Valium  Thiamine 100mg  Reevaluation of the patient after these medicines showed that the patient stayed the same I have reviewed the patients home medicines and have made adjustments as needed    Disposition:  After consideration of the diagnostic results, physical exam, history and the patients response to treatment feel that the patent would benefit from admission.   Alcohol withdrawal: Given the patient has had refractory withdrawal symptoms after several doses of benzodiazepines, he likely needs admission for further monitoring and management.  Dr. Princella Pellegrini agrees to admit patient.  Ultimate treatment and disposition to be determined by hospitalist.   Final Clinical Impression(s) / ED Diagnoses Final diagnoses:  Alcohol withdrawal syndrome with complication [facility])    Rx / DC Orders ED Discharge Orders     None         Rodena Piety 07/01/21 2108    Valarie Merino, MD 07/03/21 1625

## 2021-07-01 NOTE — Assessment & Plan Note (Signed)
>>  ASSESSMENT AND PLAN FOR ALCOHOL WITHDRAWAL (HCC) WRITTEN ON 07/05/2021 10:34 AM BY POKHREL, Rebekah Chesterfield, MD  Patient received CIWA protocol with Ativan during hospitalization with thiamine folic acid and supportive care.  Patient has felt much better at this time without any hallucinations tremors.  He feels like he is ready for discharge home and understands that he has resources to help him after his discharge.

## 2021-07-01 NOTE — ED Notes (Signed)
EDP at the bedside.  ?

## 2021-07-01 NOTE — Assessment & Plan Note (Addendum)
Liver function test stable at this time.  Counseled against alcohol

## 2021-07-01 NOTE — ED Triage Notes (Signed)
Patient reports he was lying down and both hands went numb and developed chest pain in center chest, endorses n/v/shobr. Pain rated 4/10. Pain described as sharp. Denies cardiac history.

## 2021-07-01 NOTE — Progress Notes (Incomplete)
Repeated PE scan per Dr. Si Gaul

## 2021-07-01 NOTE — H&P (Signed)
History and Physical    Patient: Jermaine [name] YFV:[id number] DOB: 1965/11/12 DOA: 07/01/2021 DOS: the patient was seen and examined on 07/01/2021 PCP: Farris Has, MD  Patient coming from: Home  Chief Complaint:  Chief Complaint  Patient presents with   Chest Pain    HPI: Jermaine [name] is a 56 y.o. male with medical history significant of EtOH abuse.  Pt presents to ED for c/o CP.  Drank 1/5th of EtOH last evening.  Had several episodes of vomiting this AM followed by onset of sharp CP.  In to ED.  Now in ED having tachycardia and tremors.    Review of Systems: As mentioned in the history of present illness. All other systems reviewed and are negative. Past Medical History:  Diagnosis Date   Alcohol abuse    Anxiety    Depression    Past Surgical History:  Procedure Laterality Date   HERNIA REPAIR     Social History:  reports that he has never smoked. He has never used smokeless tobacco. He reports current alcohol use of about 20.0 standard drinks per week. He reports that he does not currently use drugs.  No Known Allergies  Family History  Adopted: Yes    Prior to Admission medications   Medication Sig Start Date End Date Taking? Authorizing Provider  Ascorbic Acid (VITAMIN C) 1000 MG tablet Take 2,000 mg by mouth daily. 05/17/21  Yes [provider]  Cholecalciferol (VITAMIN D3) 50 MCG (2000 UT) capsule Take 2,000 Units by mouth daily. 05/17/21  Yes [provider]  escitalopram (LEXAPRO) 20 MG tablet Take 20 mg by mouth daily. 02/27/19  Yes [provider]  Multiple Vitamin (MULTIVITAMIN WITH MINERALS) TABS tablet Take 1 tablet by mouth daily. 02/06/19  Yes Pearson Grippe, MD  vitamin B-12 (CYANOCOBALAMIN) 1000 MCG tablet Take 2 tablets (2,000 mcg total) by mouth daily. 03/22/19  Yes Sheikh, Omair Latif, DO  zinc gluconate 50 MG tablet Take 50 mg by mouth daily. 05/17/21  Yes [provider]  chlordiazePOXIDE (LIBRIUM) 25 MG capsule  50mg  PO TID x 1D, then 25 mg PO BID X 1D, then 25 mg PO QD X 1D Patient not taking: Reported on 07/01/2021 06/21/19   06/23/19, DO  folic acid (FOLVITE) 1 MG tablet Take 1 tablet (1 mg total) by mouth daily. Patient not taking: Reported on 07/01/2021 03/22/19   03/24/19 Latif, DO  thiamine 100 MG tablet Take 1 tablet (100 mg total) by mouth daily. Patient not taking: Reported on 07/01/2021 03/22/19   03/24/19, DO    Physical Exam: Vitals:   07/01/21 1800 07/01/21 1830 07/01/21 1832 07/01/21 1900  BP: (!) 153/111 (!) 136/104  126/87  Pulse: (!) 112 100 99 (!) 121  Resp: (!) 29 14 13 20   Temp:      TempSrc:      SpO2: 90% 94% 93% 93%  Weight:      Height:       Constitutional: NAD, calm, comfortable Eyes: PERRL, lids and conjunctivae normal ENMT: Mucous membranes are moist. Posterior pharynx clear of any exudate or lesions.Normal dentition.  Neck: normal, supple, no masses, no thyromegaly Respiratory: clear to auscultation bilaterally, no wheezing, no crackles. Normal respiratory effort. No accessory muscle use.  Cardiovascular: Tachycardia present Abdomen: no tenderness, no masses palpated. No hepatosplenomegaly. Bowel sounds positive.  Musculoskeletal: no clubbing / cyanosis. No joint deformity upper and lower extremities. Good ROM, no contractures. Normal muscle tone.  Skin: no rashes, lesions, ulcers.  No induration Neurologic: Tremor present Psychiatric: Normal judgment and insight. Alert and oriented x 3. Normal mood.    Data Reviewed:  Trop neg x2, EKG shows S.Tach.  CTA PE = non diagnostic but no large central PE or other acute finding.  Assessment and Plan: * Alcohol withdrawal (HCC)- (present on admission) CIWA Tele monitor for tachycardia  Sinus tachycardia- (present on admission) Probably due to EtOH withdrawal and N/V / dehydration. CTA chest ruled out large central PE although not able to rule out smaller PEs due to contrast bolus timing. No RHS  on CTA chest. No trop elevations. Overall PE as cause of his S.Tach today sounds less likely.  Nausea & vomiting- (present on admission) EtOH gastritis vs the viral gastroenteritis that's going around town right now. IVF Zofran PRN nausea. Clear liquids for the moment.  Alcoholic cirrhosis of liver without ascites (HCC)- (present on admission) No anemia nor hematemesis. But not sure about presence / absence of gastric / esophageal varicies. Therefore will use SCDs for DVT ppx.       Advance Care Planning:   Code Status: Full Code  Consults: None  Family Communication: No family in room  Severity of Illness: The appropriate patient status for this patient is OBSERVATION. Observation status is judged to be reasonable and necessary in order to provide the required intensity of service to ensure the patient's safety. The patient's presenting symptoms, physical exam findings, and initial radiographic and laboratory data in the context of their medical condition is felt to place them at decreased risk for further clinical deterioration. Furthermore, it is anticipated that the patient will be medically stable for discharge from the hospital within 2 midnights of admission.   Author: Hillary Bow., DO 07/01/2021 8:18 PM  For on call review [email or web address].

## 2021-07-01 NOTE — ED Notes (Signed)
ED TO INPATIENT HANDOFF REPORT  ED Nurse Name and Phone #:   S Name/Age/Gender Jermaine [name] 56 y.o. male Room/Bed: WA04/WA04  Code Status   Code Status: Full Code  Home/SNF/Other Home Patient oriented to: self, place, time, and situation Is this baseline? Yes   Triage Complete: Triage complete  Chief Complaint Alcohol withdrawal (HCC) [F10.939]  Triage Note Patient reports he was lying down and both hands went numb and developed chest pain in center chest, endorses n/v/shobr. Pain rated 4/10. Pain described as sharp. Denies cardiac history.    Allergies No Known Allergies  Level of Care/Admitting Diagnosis ED Disposition     ED Disposition  Admit   Condition  --   Comment  Hospital Area: [facility] [[id number]]  Level of Care: Telemetry [5]  Admit to tele based on following criteria: Complex arrhythmia (Bradycardia/Tachycardia)  May place patient in observation at [facility] or [facility] if equivalent level of care is available:: No  Covid Evaluation: Asymptomatic Screening Protocol (No Symptoms)  Diagnosis: Alcohol withdrawal (HCC) [291.81.ICD-9-CM]  Admitting Physician: Hillary Bow [[id number]]  Attending Physician: Hillary Bow [phone]          B Medical/Surgery History Past Medical History:  Diagnosis Date   Alcohol abuse    Anxiety    Depression    Past Surgical History:  Procedure Laterality Date   HERNIA REPAIR       A IV Location/Drains/Wounds Patient Lines/Drains/Airways Status     Active Line/Drains/Airways     Name Placement date Placement time Site Days   Peripheral IV 07/01/21 20 G Right Antecubital 07/01/21  1358  Antecubital  less than 1            Intake/Output Last 24 hours No intake or output data in the 24 hours ending 07/01/21 2141  Labs/Imaging Results for orders placed or performed during the hospital encounter of 07/01/21 (from the past 48 hour(s))  Urinalysis, Routine w reflex microscopic  Urine, Clean Catch     Status: Abnormal   Collection Time: 07/01/21  1:37 PM  Result Value Ref Range   Color, Urine YELLOW (A) YELLOW   APPearance CLEAR (A) CLEAR   Specific Gravity, Urine 1.010 1.005 - 1.030   pH 6.0 5.0 - 8.0   Glucose, UA NEGATIVE NEGATIVE mg/dL   Hgb urine dipstick NEGATIVE NEGATIVE   Bilirubin Urine NEGATIVE NEGATIVE   Ketones, ur 80 (A) NEGATIVE mg/dL   Protein, ur NEGATIVE NEGATIVE mg/dL   Nitrite NEGATIVE NEGATIVE   Leukocytes,Ua NEGATIVE NEGATIVE   RBC / HPF 0-5 0 - 5 RBC/hpf   WBC, UA 0-5 0 - 5 WBC/hpf   Bacteria, UA NONE SEEN NONE SEEN   Squamous Epithelial / LPF 0-5 0 - 5   Mucus PRESENT     Comment: Performed at [facility], [address], [place], [place] [address]  Rapid urine drug screen (hospital performed)     Status: None   Collection Time: 07/01/21  1:38 PM  Result Value Ref Range   Opiates NONE DETECTED NONE DETECTED   Cocaine NONE DETECTED NONE DETECTED   Benzodiazepines NONE DETECTED NONE DETECTED   Amphetamines NONE DETECTED NONE DETECTED   Tetrahydrocannabinol NONE DETECTED NONE DETECTED   Barbiturates NONE DETECTED NONE DETECTED    Comment: (NOTE) DRUG SCREEN FOR MEDICAL PURPOSES ONLY.  IF CONFIRMATION IS NEEDED FOR ANY PURPOSE, NOTIFY LAB WITHIN 5 DAYS.  LOWEST DETECTABLE LIMITS FOR URINE DRUG SCREEN Drug Class  Cutoff (ng/mL) Amphetamine and metabolites    1000 Barbiturate and metabolites    200 Benzodiazepine                 200 Tricyclics and metabolites     300 Opiates and metabolites        300 Cocaine and metabolites        300 THC                            50 Performed at [facility], [address] [place], [place] [address]   Troponin I (High Sensitivity)     Status: None   Collection Time: 07/01/21  2:06 PM  Result Value Ref Range   Troponin I (High Sensitivity) 10 <18 ng/L    Comment: (NOTE) Elevated high sensitivity troponin I (hsTnI) values and  significant  changes across serial measurements may suggest ACS but many other  chronic and acute conditions are known to elevate hsTnI results.  Refer to the "Links" section for chest pain algorithms and additional  guidance. Performed at [facility], [address] [place], [place] [address]   Comprehensive metabolic panel     Status: Abnormal   Collection Time: 07/01/21  2:06 PM  Result Value Ref Range   Sodium 136 135 - 145 mmol/L    Comment: REPEATED TO VERIFY   Potassium 3.2 (L) 3.5 - 5.1 mmol/L    Comment: REPEATED TO VERIFY   Chloride 93 (L) 98 - 111 mmol/L    Comment: REPEATED TO VERIFY   CO2 22 22 - 32 mmol/L    Comment: REPEATED TO VERIFY   Glucose, Bld 86 70 - 99 mg/dL    Comment: Glucose reference range applies only to samples taken after fasting for at least 8 hours.   BUN 17 6 - 20 mg/dL   Creatinine, Ser 6.441.10 0.61 - 1.24 mg/dL   Calcium 8.6 (L) 8.9 - 10.3 mg/dL    Comment: REPEATED TO VERIFY   Total Protein 7.5 6.5 - 8.1 g/dL   Albumin 4.6 3.5 - 5.0 g/dL   AST 38 15 - 41 U/L   ALT 33 0 - 44 U/L   Alkaline Phosphatase 56 38 - 126 U/L   Total Bilirubin 0.8 0.3 - 1.2 mg/dL   GFR, Estimated >03>60 >47>60 mL/min    Comment: (NOTE) Calculated using the CKD-EPI Creatinine Equation (2021)    Anion gap 21 (H) 5 - 15    Comment: Performed at [facility], [address] [place], [place] [address]  CBC with Differential     Status: Abnormal   Collection Time: 07/01/21  2:06 PM  Result Value Ref Range   WBC 15.0 (H) 4.0 - 10.5 K/uL   RBC 5.26 4.22 - 5.81 MIL/uL   Hemoglobin 16.7 13.0 - 17.0 g/dL   HCT 63.849.6 75.639.0 - 43.352.0 %   MCV 94.3 80.0 - 100.0 fL   MCH 31.7 26.0 - 34.0 pg   MCHC 33.7 30.0 - 36.0 g/dL   RDW 29.513.2 18.811.5 - 41.615.5 %   Platelets 323 150 - 400 K/uL   nRBC 0.0 0.0 - 0.2 %   Neutrophils Relative % 85 %   Neutro Abs 12.7 (H) 1.7 - 7.7 K/uL   Lymphocytes Relative 10 %   Lymphs Abs 1.6 0.7 - 4.0 K/uL   Monocytes Relative 4  %   Monocytes Absolute 0.6 0.1 - 1.0 K/uL  Eosinophils Relative 0 %   Eosinophils Absolute 0.0 0.0 - 0.5 K/uL   Basophils Relative 1 %   Basophils Absolute 0.1 0.0 - 0.1 K/uL   Immature Granulocytes 0 %   Abs Immature Granulocytes 0.05 0.00 - 0.07 K/uL    Comment: Performed at [facility], [address] [place], [place] [address]  Ammonia     Status: None   Collection Time: 07/01/21  2:06 PM  Result Value Ref Range   Ammonia 17 9 - 35 umol/L    Comment: Performed at [facility], [address] [place], [place] [address]  Ethanol     Status: Abnormal   Collection Time: 07/01/21  2:06 PM  Result Value Ref Range   Alcohol, Ethyl (B) 24 (H) <10 mg/dL    Comment: (NOTE) Lowest detectable limit for serum alcohol is 10 mg/dL.  For medical purposes only. Performed at [facility], [address] [place], [place] [address]   Troponin I (High Sensitivity)     Status: None   Collection Time: 07/01/21  3:23 PM  Result Value Ref Range   Troponin I (High Sensitivity) 10 <18 ng/L    Comment: (NOTE) Elevated high sensitivity troponin I (hsTnI) values and significant  changes across serial measurements may suggest ACS but many other  chronic and acute conditions are known to elevate hsTnI results.  Refer to the "Links" section for chest pain algorithms and additional  guidance. Performed at [facility], [address] [place], [place] [address]    DG Chest 2 View  Result Date: 07/01/2021 CLINICAL DATA:  Chest pain, shortness of breath. EXAM: CHEST - 2 VIEW COMPARISON:  Chest x-ray dated 11/19/2018 FINDINGS: Heart size and mediastinal contours are stable. Lungs are clear. No pleural effusion or pneumothorax is seen. Osseous structures about the chest are unremarkable. IMPRESSION: No active cardiopulmonary disease. Electronically Signed   By: Bary RichardStan  Maynard M.D.   On: 07/01/2021 13:39   CT Angio Chest PE W  and/or Wo Contrast  Result Date: 07/01/2021 CLINICAL DATA:  Pulmonary embolus suspected. Unknown D-dimer. Chest pain. EXAM: CT ANGIOGRAPHY CHEST WITH CONTRAST TECHNIQUE: Multidetector CT imaging of the chest was performed using the standard protocol during bolus administration of intravenous contrast. Multiplanar CT image reconstructions and MIPs were obtained to evaluate the vascular anatomy. Initial scan was nondiagnostic for pulmonary embolus and repeat imaging was obtained. RADIATION DOSE REDUCTION: This exam was performed according to the departmental dose-optimization program which includes automated exposure control, adjustment of the mA and/or kV according to patient size and/or use of iterative reconstruction technique. CONTRAST:  80mL OMNIPAQUE IOHEXOL 350 MG/ML SOLN COMPARISON:  Chest radiograph 07/01/2021 FINDINGS: Cardiovascular: In spite of 2 attempts at contrast injection, the contrast bolus in the pulmonary arteries is suboptimal, limiting evaluation for pulmonary embolus. No large central pulmonary emboli are demonstrated but segmental and subsegmental emboli could be obscured due to technique. Normal heart size. No pericardial effusions. Normal caliber thoracic aorta. No aneurysm or dissection. Great vessel origins are patent. Mediastinum/Nodes: Esophagus is decompressed. No significant lymphadenopathy. Thyroid gland is unremarkable. Lungs/Pleura: Lungs are clear and expanded. No pleural effusions. No pneumothorax. Upper Abdomen: No acute abnormalities are demonstrated in the visualized upper abdomen. Musculoskeletal: No acute bony abnormalities are identified. Probable old rib fractures. Review of the MIP images confirms the above findings. IMPRESSION: 1. The diagnostic quality of the examination is limited due to poor contrast bolus. No large central pulmonary emboli are demonstrated but  segmental or subsegmental emboli cannot be excluded. 2. No evidence of active pulmonary disease.  Electronically Signed   By: Burman Nieves M.D.   On: 07/01/2021 17:25    Pending Labs Unresulted Labs (From admission, onward)     Start     Ordered   07/02/21 0500  CBC  Tomorrow morning,   R        07/01/21 2003   07/02/21 0500  Basic metabolic panel  Tomorrow morning,   R        07/01/21 2003   07/01/21 2045  Resp Panel by RT-PCR (Flu A&B, Covid) Nasopharyngeal Swab  (Tier 2 - Symptomatic/asymptomatic)  Once,   R        07/01/21 2044   07/01/21 2002  HIV Antibody (routine testing w rflx)  (HIV Antibody (Routine testing w reflex) panel)  Once,   R        07/01/21 2003   07/01/21 2000  Magnesium  Once,   STAT        07/01/21 1959            Vitals/Pain Today's Vitals   07/01/21 1830 07/01/21 1832 07/01/21 1900 07/01/21 2021  BP: (!) 136/104  126/87 (!) 132/118  Pulse: 100 99 (!) 121 (!) 120  Resp: 14 13 20    Temp:      TempSrc:      SpO2: 94% 93% 93%   Weight:      Height:      PainSc:        Isolation Precautions No active isolations  Medications Medications  LORazepam (ATIVAN) tablet 1-4 mg (has no administration in time range)    Or  LORazepam (ATIVAN) injection 1-4 mg (has no administration in time range)  folic acid (FOLVITE) tablet 1 mg (has no administration in time range)  multivitamin with minerals tablet 1 tablet (has no administration in time range)  LORazepam (ATIVAN) injection 0-4 mg (2 mg Intravenous Given 07/01/21 2024)    Followed by  LORazepam (ATIVAN) injection 0-4 mg (has no administration in time range)  acetaminophen (TYLENOL) tablet 650 mg (has no administration in time range)    Or  acetaminophen (TYLENOL) suppository 650 mg (has no administration in time range)  ondansetron (ZOFRAN) tablet 4 mg (has no administration in time range)    Or  ondansetron (ZOFRAN) injection 4 mg (has no administration in time range)  lactated ringers infusion (has no administration in time range)  thiamine tablet 100 mg (has no administration in time  range)    Or  thiamine (B-1) injection 100 mg (has no administration in time range)  lactated ringers bolus 1,000 mL (0 mLs Intravenous Stopped 07/01/21 1731)  LORazepam (ATIVAN) injection 1 mg (1 mg Intravenous Given 07/01/21 1358)  LORazepam (ATIVAN) injection 1 mg (1 mg Intravenous Given 07/01/21 1428)  lactated ringers bolus 1,000 mL (0 mLs Intravenous Stopped 07/01/21 1731)  iohexol (OMNIPAQUE) 350 MG/ML injection 80 mL (80 mLs Intravenous Contrast Given 07/01/21 1640)  diazepam (VALIUM) injection 5 mg (5 mg Intravenous Given 07/01/21 1751)  diazepam (VALIUM) injection 10 mg (10 mg Intravenous Given 07/01/21 1904)  potassium chloride SA (KLOR-CON M) CR tablet 40 mEq (40 mEq Oral Given 07/01/21 1903)    Mobility walks Low fall risk   Focused Assessments    R Recommendations: See Admitting Provider Note  Report given to:   Additional Notes:

## 2021-07-01 NOTE — Assessment & Plan Note (Addendum)
Resolved at this time.  Likely secondary to alcohol withdrawal.  CTA without any PE.  Received IV fluid.   On CIWA protocol.

## 2021-07-01 NOTE — Assessment & Plan Note (Addendum)
Improved.  Tolerated oral diet.

## 2021-07-01 NOTE — ED Provider Notes (Signed)
I provided a substantive portion of the care of this patient.  I personally performed the entirety of the medical decision making for this encounter.  EKG Interpretation  Date/Time:  Saturday July 01 2021 13:43:23 EST Ventricular Rate:  129 PR Interval:  119 QRS Duration: 86 QT Interval:  301 QTC Calculation: 441 R Axis:   113 Text Interpretation: Sinus tachycardia Right axis deviation Confirmed by Kristine Royal [phone]) on 07/01/2021 2:17:56 PM   Patient here with atypical chest pain along with emesis.  Patient drinks heavily and consume fifth of alcohol last evening.  Patient is going through alcohol withdrawal here does have history of DTs.  Medicated with benzodiazepines without improvement.  Will be admitted to the hospitalist service   Lorre Nick, MD 07/01/21 2106

## 2021-07-02 ENCOUNTER — Encounter (HOSPITAL_COMMUNITY): Payer: Self-pay | Admitting: Internal Medicine

## 2021-07-02 DIAGNOSIS — F32A Depression, unspecified: Secondary | ICD-10-CM | POA: Diagnosis present

## 2021-07-02 DIAGNOSIS — F419 Anxiety disorder, unspecified: Secondary | ICD-10-CM | POA: Diagnosis present

## 2021-07-02 DIAGNOSIS — Z20822 Contact with and (suspected) exposure to covid-19: Secondary | ICD-10-CM | POA: Diagnosis present

## 2021-07-02 DIAGNOSIS — F10932 Alcohol use, unspecified with withdrawal with perceptual disturbance: Secondary | ICD-10-CM | POA: Diagnosis not present

## 2021-07-02 DIAGNOSIS — F10930 Alcohol use, unspecified with withdrawal, uncomplicated: Secondary | ICD-10-CM | POA: Diagnosis not present

## 2021-07-02 DIAGNOSIS — K2920 Alcoholic gastritis without bleeding: Secondary | ICD-10-CM | POA: Diagnosis present

## 2021-07-02 DIAGNOSIS — F10239 Alcohol dependence with withdrawal, unspecified: Secondary | ICD-10-CM | POA: Diagnosis present

## 2021-07-02 DIAGNOSIS — F10939 Alcohol use, unspecified with withdrawal, unspecified: Secondary | ICD-10-CM | POA: Diagnosis present

## 2021-07-02 DIAGNOSIS — E876 Hypokalemia: Secondary | ICD-10-CM | POA: Diagnosis present

## 2021-07-02 DIAGNOSIS — K7030 Alcoholic cirrhosis of liver without ascites: Secondary | ICD-10-CM | POA: Diagnosis present

## 2021-07-02 DIAGNOSIS — D72828 Other elevated white blood cell count: Secondary | ICD-10-CM | POA: Diagnosis present

## 2021-07-02 DIAGNOSIS — K219 Gastro-esophageal reflux disease without esophagitis: Secondary | ICD-10-CM | POA: Diagnosis present

## 2021-07-02 DIAGNOSIS — E860 Dehydration: Secondary | ICD-10-CM | POA: Diagnosis present

## 2021-07-02 LAB — CBC
HCT: 41.5 % (ref 39.0–52.0)
Hemoglobin: 13.6 g/dL (ref 13.0–17.0)
MCH: 31.6 pg (ref 26.0–34.0)
MCHC: 32.8 g/dL (ref 30.0–36.0)
MCV: 96.5 fL (ref 80.0–100.0)
Platelets: 219 10*3/uL (ref 150–400)
RBC: 4.3 MIL/uL (ref 4.22–5.81)
RDW: 13.2 % (ref 11.5–15.5)
WBC: 7.7 10*3/uL (ref 4.0–10.5)
nRBC: 0 % (ref 0.0–0.2)

## 2021-07-02 LAB — BASIC METABOLIC PANEL
Anion gap: 8 (ref 5–15)
BUN: 13 mg/dL (ref 6–20)
CO2: 30 mmol/L (ref 22–32)
Calcium: 8.3 mg/dL — ABNORMAL LOW (ref 8.9–10.3)
Chloride: 98 mmol/L (ref 98–111)
Creatinine, Ser: 0.94 mg/dL (ref 0.61–1.24)
GFR, Estimated: >60 mL/min (ref >60)
Glucose, Bld: 95 mg/dL (ref 70–99)
Potassium: 3.1 mmol/L — ABNORMAL LOW (ref 3.5–5.1)
Sodium: 136 mmol/L (ref 135–145)

## 2021-07-02 MED ORDER — MAGNESIUM SULFATE 4 GM/100ML IV SOLN
4.0000 g | Freq: Once | INTRAVENOUS | Status: AC
  Administered 2021-07-02: 4 g via INTRAVENOUS
  Filled 2021-07-02: qty 100

## 2021-07-02 MED ORDER — TRAMADOL HCL 50 MG PO TABS
50.0000 mg | ORAL_TABLET | Freq: Once | ORAL | Status: AC
  Administered 2021-07-02: 50 mg via ORAL
  Filled 2021-07-02: qty 1

## 2021-07-02 MED ORDER — POTASSIUM CHLORIDE CRYS ER 20 MEQ PO TBCR
40.0000 meq | EXTENDED_RELEASE_TABLET | ORAL | Status: AC
  Administered 2021-07-02 (×2): 40 meq via ORAL
  Filled 2021-07-02 (×2): qty 2

## 2021-07-02 NOTE — Hospital Course (Addendum)
56 year old male with past medical history of alcohol abuse presented to the emergency room with bilateral hand numbness, shakiness, nausea vomiting and diarrhea.  In the ED, patient was noted to be tachycardic with tremors,.  Patient also had electrolyte abnormalities and dehydration so admitted to the hospital for alcohol abuse, tremors and electrolyte imbalance.  Patient wished to continue detox protocol while in the hospital.

## 2021-07-02 NOTE — Assessment & Plan Note (Addendum)
Improved after replacement.  Potassium prior to discharge was 3.9 magnesium of 2.4.  We will continue potassium for few more days on discharge.

## 2021-07-02 NOTE — Assessment & Plan Note (Addendum)
Improved after replacement.  Continue magnesium oxide after discharge.Marland Kitchen

## 2021-07-02 NOTE — Progress Notes (Signed)
°  Progress Note   Patient: Jermaine [name] [id number] DOB: 04/14/1966 DOA: 07/01/2021     0 DOS: the patient was seen and examined on 07/02/2021   Brief hospital course: 56 year old with history of alcohol abuse presented to the emergency room with bilateral hand numbness, shakiness, nausea vomiting and diarrhea.  In the emergency room noted to be tachycardic with tremors.  Electrolyte abnormalities and dehydration so admitted to the hospital.  Assessment and Plan: * Alcohol withdrawal ([place])- (present on admission) High risk of delirium tremens.  Currently remains on CIWA protocol and multivitamins.  Fall precautions.  Seizure precautions.  Hemodynamically stable at this time.  Continue very close monitoring.  Hypomagnesemia- (present on admission) Replace aggressively today with IV magnesium.  Recheck levels tomorrow morning.  Hypokalemia- (present on admission) Replace aggressively and recheck levels tomorrow morning.  Check phosphorus levels.  Sinus tachycardia- (present on admission) Probably due to EtOH withdrawal and N/V / dehydration. CTA chest ruled out large central PE although not able to rule out smaller PEs due to contrast bolus timing. No RHS on CTA chest. No trop elevations. Improving.  Nausea & vomiting- (present on admission) Probably due to alcohol induced gastritis/reflux.  Symptomatically improved.  Continue IV fluids today. Advance to regular diet.  Alcoholic cirrhosis of liver without ascites ([place])- (present on admission) Stable.  Liver function tests are stable.        Subjective: Patient seen and examined.  Shaky and tremulous.  Denies any nausea vomiting.  He thinks he is ready to eat.  Remains anxious.  Determined to quit alcohol and he would like outpatient resources.  Physical Exam: Vitals:   07/01/21 2255 07/02/21 0307 07/02/21 0749 07/02/21 0843  BP: (!) 154/98 140/90 (!) 143/79 (!) 149/82  Pulse: 80 79 81 81  Resp: 15 20 19 15   Temp: 98.5 F  (36.9 C) (!) 97.5 F (36.4 C) 98 F (36.7 C) 98.7 F (37.1 C)  TempSrc: Oral Oral Oral Oral  SpO2: 99% 99% 97% 96%  Weight:      Height:       General: Anxious with some resting tremors.  Able to talk in full sentences. Cardiovascular: S1-S2 normal.  Regular rate rhythm. Respiratory: Bilateral clear.  No added sounds. Gastrointestinal: Soft.  Nontender.  Bowel sound present. Ext: No swelling or edema.  No cyanosis. Neuro: Alert oriented x4.  Tremulous and anxious.  No focal deficits. Musculoskeletal: No deformities.   Data Reviewed:  Electrolytes were reviewed and replaced.  Family Communication: None.  Disposition: Status is: Observation The patient will require care spanning > 2 midnights and should be moved to inpatient because: IV electrolytes, IV benzodiazepines needed        Planned Discharge Destination: Home     Time spent: 35 minutes  Author: Barb Merino, MD 07/02/2021 9:38 AM  For on call review [email or web address].

## 2021-07-03 DIAGNOSIS — F10930 Alcohol use, unspecified with withdrawal, uncomplicated: Secondary | ICD-10-CM

## 2021-07-03 LAB — BASIC METABOLIC PANEL
Anion gap: 6 (ref 5–15)
BUN: 6 mg/dL (ref 6–20)
CO2: 28 mmol/L (ref 22–32)
Calcium: 8.4 mg/dL — ABNORMAL LOW (ref 8.9–10.3)
Chloride: 103 mmol/L (ref 98–111)
Creatinine, Ser: 0.74 mg/dL (ref 0.61–1.24)
GFR, Estimated: >60 mL/min (ref >60)
Glucose, Bld: 100 mg/dL — ABNORMAL HIGH (ref 70–99)
Potassium: 3.5 mmol/L (ref 3.5–5.1)
Sodium: 137 mmol/L (ref 135–145)

## 2021-07-03 LAB — MAGNESIUM: Magnesium: 2.1 mg/dL (ref 1.7–2.4)

## 2021-07-03 MED ORDER — MELATONIN 3 MG PO TABS
3.0000 mg | ORAL_TABLET | Freq: Once | ORAL | Status: AC
  Administered 2021-07-03: 3 mg via ORAL
  Filled 2021-07-03: qty 1

## 2021-07-03 MED ORDER — MAGNESIUM SULFATE 2 GM/50ML IV SOLN
2.0000 g | Freq: Once | INTRAVENOUS | Status: AC
Start: 2021-07-03 — End: 2021-07-03
  Administered 2021-07-03: 2 g via INTRAVENOUS
  Filled 2021-07-03: qty 50

## 2021-07-03 MED ORDER — POTASSIUM CHLORIDE CRYS ER 20 MEQ PO TBCR
40.0000 meq | EXTENDED_RELEASE_TABLET | ORAL | Status: AC
  Administered 2021-07-03 (×2): 40 meq via ORAL
  Filled 2021-07-03 (×2): qty 2

## 2021-07-03 MED ORDER — MAGNESIUM OXIDE -MG SUPPLEMENT 400 (240 MG) MG PO TABS
400.0000 mg | ORAL_TABLET | Freq: Two times a day (BID) | ORAL | Status: DC
  Administered 2021-07-03 – 2021-07-05 (×5): 400 mg via ORAL
  Filled 2021-07-03 (×5): qty 1

## 2021-07-03 NOTE — Progress Notes (Signed)
Progress Note   Patient: Jermaine [name] [id number] DOB: [birth year] DOA: 07/01/2021     1 DOS: the patient was seen and examined on 07/03/2021   Brief hospital course: 56 year old male with past medical history of alcohol abuse presented to the emergency room with bilateral hand numbness, shakiness, nausea vomiting and diarrhea.  In the emergency room, patient was noted to be tachycardic with tremors,.  Patient also had electrolyte abnormalities and dehydration so admitted to the hospital for alcohol abuse, tremors and electrolyte imbalance..  Assessment and Plan: * Alcohol withdrawal ([place])- (present on admission) Continue CIWA protocol.  High risk of delirium tremens.  Continue to monitor closely.  Patient still complains of some hallucinations and tremors but better than before.  Hypomagnesemia- (present on admission) Received IV magnesium.  We will continue to replace as necessary.  Hypokalemia- (present on admission) Has been replenished.  We will continue to replenish.  Check levels in a.m.  Sinus tachycardia- (present on admission) Likely secondary to alcohol withdrawal.  CTA without any PE.  Received IV fluid.  Improved at this time.  On CIWA protocol.  Nausea & vomiting- (present on admission) Improved.  Has been advanced to regular diet  Alcoholic cirrhosis of liver without ascites ([place])- (present on admission) Liver function test stable at this time.  Subjective: Today, patient was seen and examined. Complains of fatigue, weakness, tiredness, has mild hallucinations, diarrhea.  Physical Exam: Vitals:   07/02/21 1313 07/02/21 1757 07/02/21 2012 07/03/21 0624  BP: 129/85 (!) 159/99 138/83 134/88  Pulse: (!) 106 88 97 72  Resp: 19 20 16 15   Temp: 97.7 F (36.5 C) 98 F (36.7 C) 98.6 F (37 C) 97.9 F (36.6 C)  TempSrc: Oral Oral Oral Oral  SpO2: 96% 97% 97% 94%  Weight:      Height:       Body mass index is 31.57 kg/m.  General: Obese built, not in obvious  distress HENT:   No scleral pallor or icterus noted. Oral mucosa is moist.  Chest:  Clear breath sounds.  Diminished breath sounds bilaterally. No crackles or wheezes.  CVS: S1 &S2 heard. No murmur.  Regular rate and rhythm. Abdomen: Soft, nontender, nondistended.  Bowel sounds are heard.   Extremities: No cyanosis, clubbing or edema.  Peripheral pulses are palpable. Psych: Alert, awake and oriented, normal mood CNS:  No cranial nerve deficits.  Power equal in all extremities.   Skin: Warm and dry.  No rashes noted.  Data Reviewed: CBC Latest Ref Rng & Units 07/02/2021 07/01/2021 03/20/2019  WBC 4.0 - 10.5 K/uL 7.7 15.0(H) 4.8  Hemoglobin 13.0 - 17.0 g/dL 13.6 16.7 13.1  Hematocrit 39.0 - 52.0 % 41.5 49.6 40.7  Platelets 150 - 400 K/uL 219 323 173    BMP Latest Ref Rng & Units 07/02/2021 07/01/2021 03/20/2019  Glucose 70 - 99 mg/dL 95 86 93  BUN 6 - 20 mg/dL 13 17 13   Creatinine 0.61 - 1.24 mg/dL 0.94 1.10 0.81  Sodium 135 - 145 mmol/L 136 136 140  Potassium 3.5 - 5.1 mmol/L 3.1(L) 3.2(L) 3.8  Chloride 98 - 111 mmol/L 98 93(L) 107  CO2 22 - 32 mmol/L 30 22 25   Calcium 8.9 - 10.3 mg/dL 8.3(L) 8.6(L) 8.2(L)    Family Communication: spoke with the patient.  Disposition: Status is: Inpatient Remains inpatient appropriate because: Electrolyte imbalance, alcohol withdrawal    Planned Discharge Destination: Home   Author: Flora Lipps, MD 07/03/2021 7:48 AM  For on call review [email or web address].

## 2021-07-04 LAB — CBC
HCT: 43.9 % (ref 39.0–52.0)
Hemoglobin: 14.5 g/dL (ref 13.0–17.0)
MCH: 31.9 pg (ref 26.0–34.0)
MCHC: 33 g/dL (ref 30.0–36.0)
MCV: 96.5 fL (ref 80.0–100.0)
Platelets: 182 10*3/uL (ref 150–400)
RBC: 4.55 MIL/uL (ref 4.22–5.81)
RDW: 13.1 % (ref 11.5–15.5)
WBC: 11.4 10*3/uL — ABNORMAL HIGH (ref 4.0–10.5)
nRBC: 0 % (ref 0.0–0.2)

## 2021-07-04 LAB — BASIC METABOLIC PANEL
Anion gap: 5 (ref 5–15)
BUN: 10 mg/dL (ref 6–20)
CO2: 29 mmol/L (ref 22–32)
Calcium: 8.5 mg/dL — ABNORMAL LOW (ref 8.9–10.3)
Chloride: 105 mmol/L (ref 98–111)
Creatinine, Ser: 0.83 mg/dL (ref 0.61–1.24)
GFR, Estimated: >60 mL/min (ref >60)
Glucose, Bld: 98 mg/dL (ref 70–99)
Potassium: 3.9 mmol/L (ref 3.5–5.1)
Sodium: 139 mmol/L (ref 135–145)

## 2021-07-04 LAB — MAGNESIUM: Magnesium: 2.4 mg/dL (ref 1.7–2.4)

## 2021-07-04 MED ORDER — POTASSIUM CHLORIDE CRYS ER 20 MEQ PO TBCR
40.0000 meq | EXTENDED_RELEASE_TABLET | Freq: Every day | ORAL | Status: DC
  Administered 2021-07-05: 40 meq via ORAL
  Filled 2021-07-04 (×2): qty 2

## 2021-07-04 MED ORDER — LORAZEPAM 1 MG PO TABS
1.0000 mg | ORAL_TABLET | ORAL | Status: DC | PRN
  Administered 2021-07-05: 1 mg via ORAL
  Filled 2021-07-04: qty 1

## 2021-07-04 MED ORDER — LORAZEPAM 2 MG/ML IJ SOLN
1.0000 mg | INTRAMUSCULAR | Status: DC | PRN
  Administered 2021-07-05: 2 mg via INTRAVENOUS
  Filled 2021-07-04: qty 1

## 2021-07-04 NOTE — Progress Notes (Signed)
PROGRESS NOTE    Jermaine [name]  MHD:[id number] DOB: 05/02/1966 DOA: 07/01/2021 PCP: Farris Has, MD    Brief Narrative:  56 year old male with past medical history of alcohol abuse presented to the emergency room with bilateral hand numbness, shakiness, nausea vomiting and diarrhea.  In the ED, patient was noted to be tachycardic with tremors,.  Patient also had electrolyte abnormalities and dehydration so admitted to the hospital for alcohol abuse, tremors and electrolyte imbalance.  And wished to continue detox protocol while in the hospital.     Assessment and Plan: * Alcohol withdrawal ([facility])- (present on admission) Continue CIWA protocol.  High risk of delirium tremens.  Continue to monitor closely.  Patient is on Ativan protocol at this time.  Denies hallucinations at this time but still feels fidgety and restless.  Hypomagnesemia- (present on admission) Improved after replacement.  Continue magnesium oxide.  Hypokalemia- (present on admission) Improved after replacement.  Continue daily potassium.  Check levels in a.m.  Sinus tachycardia- (present on admission) Resolved at this time.  Likely secondary to alcohol withdrawal.  CTA without any PE.  Received IV fluid.   On CIWA protocol.  Nausea & vomiting- (present on admission) Improved.  Has been advanced to regular diet  Alcoholic cirrhosis of liver without ascites (HCC)- (present on admission) Liver function test stable at this time.    DVT prophylaxis: SCDs Start: 07/01/21 2002   Code Status:     Code Status: Full Code  Disposition:  Status is: Inpatient Remains inpatient appropriate because: Alcohol withdrawal on CIWA protocol    Family Communication:  Spoke with the patient at bedside.  Consultants:  None  Procedures:  None  Antimicrobials:  None  Anti-infectives (From admission, onward)    None       Subjective: Today, patient was seen and examined at bedside.  Patient still feels little  fidgety and jittery but denies hallucinations.  Objective: Vitals:   07/04/21 0900 07/04/21 1054 07/04/21 1200 07/04/21 1205  BP: 126/70 130/74 128/76 129/88  Pulse: 80 80 78 97  Resp:    18  Temp:    98.2 F (36.8 C)  TempSrc:      SpO2:    98%  Weight:      Height:        Intake/Output Summary (Last 24 hours) at 07/04/2021 1330 Last data filed at 07/04/2021 0900 Gross per 24 hour  Intake 3099.01 ml  Output --  Net 3099.01 ml   Filed Weights   07/01/21 1326  Weight: 99.8 kg    Physical Examination: General:  Average built, not in obvious distress HENT:   No scleral pallor or icterus noted. Oral mucosa is moist.  Chest:  Clear breath sounds.  Diminished breath sounds bilaterally. No crackles or wheezes.  CVS: S1 &S2 heard. No murmur.  Regular rate and rhythm. Abdomen: Soft, nontender, nondistended.  Bowel sounds are heard.   Extremities: No cyanosis, clubbing or edema.  Peripheral pulses are palpable.  Mild tremors of the upper extremity. Psych: Alert, awake and oriented, mildly anxious, CNS:  No cranial nerve deficits.  Power equal in all extremities.   Skin: Warm and dry.  No rashes noted.  Data Reviewed:   CBC: Recent Labs  Lab 07/01/21 1406 07/02/21 0535 07/04/21 0457  WBC 15.0* 7.7 11.4*  NEUTROABS 12.7*  --   --   HGB 16.7 13.6 14.5  HCT 49.6 41.5 43.9  MCV 94.3 96.5 96.5  PLT 323 219 182    Basic Metabolic  Panel: Recent Labs  Lab 07/01/21 1406 07/01/21 1523 07/02/21 0535 07/03/21 0804 07/04/21 0457  NA 136  --  136 137 139  K 3.2*  --  3.1* 3.5 3.9  CL 93*  --  98 103 105  CO2 22  --  30 28 29   GLUCOSE 86  --  95 100* 98  BUN 17  --  13 6 10   CREATININE 1.10  --  0.94 0.74 0.83  CALCIUM 8.6*  --  8.3* 8.4* 8.5*  MG  --  1.3*  --  2.1 2.4    GFR: Estimated Creatinine Clearance: 119.1 mL/min (by C-G formula based on SCr of 0.83 mg/dL).  Liver Function Tests: Recent Labs  Lab 07/01/21 1406  AST 38  ALT 33  ALKPHOS 56  BILITOT 0.8   PROT 7.5  ALBUMIN 4.6    CBG: No results for input(s): GLUCAP in the last 168 hours.   Recent Results (from the past 240 hour(s))  Resp Panel by RT-PCR (Flu A&B, Covid) Nasopharyngeal Swab     Status: None   Collection Time: 07/01/21  9:03 PM   Specimen: Nasopharyngeal Swab; Nasopharyngeal(NP) swabs in vial transport medium  Result Value Ref Range Status   SARS Coronavirus 2 by RT PCR NEGATIVE NEGATIVE Final    Comment: (NOTE) SARS-CoV-2 target nucleic acids are NOT DETECTED.  The SARS-CoV-2 RNA is generally detectable in upper respiratory specimens during the acute phase of infection. The lowest concentration of SARS-CoV-2 viral copies this assay can detect is 138 copies/mL. A negative result does not preclude SARS-Cov-2 infection and should not be used as the sole basis for treatment or other patient management decisions. A negative result may occur with  improper specimen collection/handling, submission of specimen other than nasopharyngeal swab, presence of viral mutation(s) within the areas targeted by this assay, and inadequate number of viral copies(<138 copies/mL). A negative result must be combined with clinical observations, patient history, and epidemiological information. The expected result is Negative.  Fact Sheet for Patients:  08/29/21  Fact Sheet for Healthcare Providers:  08/29/21  This test is no t yet approved or cleared by the [employer] FDA and  has been authorized for detection and/or diagnosis of SARS-CoV-2 by FDA under an Emergency Use Authorization (EUA). This EUA will remain  in effect (meaning this test can be used) for the duration of the COVID-19 declaration under Section 564(b)(1) of the Act, 21 U.S.C.section 360bbb-3(b)(1), unless the authorization is terminated  or revoked sooner.       Influenza A by PCR NEGATIVE NEGATIVE Final   Influenza B by PCR NEGATIVE NEGATIVE  Final    Comment: (NOTE) The Xpert Xpress SARS-CoV-2/FLU/RSV plus assay is intended as an aid in the diagnosis of influenza from Nasopharyngeal swab specimens and should not be used as a sole basis for treatment. Nasal washings and aspirates are unacceptable for Xpert Xpress SARS-CoV-2/FLU/RSV testing.  Fact Sheet for Patients: SeriousBroker.it  Fact Sheet for Healthcare Providers: [place]  This test is not yet approved or cleared by the [employer] FDA and has been authorized for detection and/or diagnosis of SARS-CoV-2 by FDA under an Emergency Use Authorization (EUA). This EUA will remain in effect (meaning this test can be used) for the duration of the COVID-19 declaration under Section 564(b)(1) of the Act, 21 U.S.C. section 360bbb-3(b)(1), unless the authorization is terminated or revoked.  Performed at [facility], [address] [place], [place]       Radiology  Studies: No results found.   Scheduled Meds:  folic acid  1 mg Oral Daily   LORazepam  0-4 mg Intravenous Q12H   magnesium oxide  400 mg Oral BID   multivitamin with minerals  1 tablet Oral Daily   [START ON 07/05/2021] potassium chloride  40 mEq Oral Daily   thiamine  100 mg Oral Daily   Or   thiamine  100 mg Intravenous Daily   Continuous Infusions:   LOS: 2 days    Joycelyn Das, MD [facility] 07/04/2021, 1:30 PM

## 2021-07-05 DIAGNOSIS — K7030 Alcoholic cirrhosis of liver without ascites: Secondary | ICD-10-CM

## 2021-07-05 MED ORDER — ONDANSETRON HCL 4 MG PO TABS: 4.0000 mg | ORAL_TABLET | Freq: Four times a day (QID) | ORAL | 0 refills | Status: DC | PRN

## 2021-07-05 MED ORDER — POTASSIUM CHLORIDE CRYS ER 20 MEQ PO TBCR: 20.0000 meq | EXTENDED_RELEASE_TABLET | Freq: Every day | ORAL | 0 refills | Status: DC

## 2021-07-05 MED ORDER — MAGNESIUM OXIDE -MG SUPPLEMENT 400 (240 MG) MG PO TABS: 400.0000 mg | ORAL_TABLET | Freq: Every day | ORAL | 0 refills | Status: AC

## 2021-07-05 MED ORDER — THIAMINE HCL 100 MG PO TABS: 100.0000 mg | ORAL_TABLET | Freq: Every day | ORAL | 0 refills | Status: DC

## 2021-07-05 NOTE — TOC Transition Note (Signed)
Transition of Care [facility]) - CM/SW Discharge Note   Patient Details  Name: Jermaine [name] MRN: [id number] Date of Birth: 1966-03-21  [facility] CM/SW Contact:  Lanier Clam, RN Phone Number: 07/05/2021, 9:15 AM   Clinical Narrative: Referral for etoh resources-patient states he has gone to many otpt etoh places, & Is aware,but feels he can manage on own w/resources. No further CM needs.      Final next level of care: Home/Self Care Barriers to Discharge: No Barriers Identified   Patient Goals and CMS Choice Patient states their goals for this hospitalization and ongoing recovery are:: home CMS Medicare.gov Compare Post Acute Care list provided to:: Patient Choice offered to / list presented to : Patient  Discharge Placement                       Discharge Plan and Services   Discharge Planning Services: CM Consult                                 Social Determinants of Health (SDOH) Interventions     Readmission Risk Interventions No flowsheet data found.

## 2021-07-05 NOTE — Discharge Summary (Signed)
Physician Discharge Summary   Patient: Jermaine [name] MRN: [id number] DOB: [birth year]  Admit date:     07/01/2021  Discharge date: 07/05/21  Discharge Physician: Joycelyn DasLaxman    PCP: [name], [name], MD   Recommendations at discharge:   Follow-up with your primary care physician in 1 week.  Check blood work at that time including CBC LFTs BMP and magnesium at that time. Patient was encouraged to seek resources for alcohol cessation programs on discharge.  Discharge Diagnoses: Principal Problem:   Alcohol withdrawal (HCC) Active Problems:   Alcoholic cirrhosis of liver without ascites (HCC)   Nausea & vomiting   Sinus tachycardia   Hypokalemia   Hypomagnesemia  Resolved Problems:   Alcohol withdrawal syndrome [facility](HCC)   Hospital Course: 56 year old male with past medical history of alcohol abuse presented to the emergency room with bilateral hand numbness, shakiness, nausea vomiting and diarrhea.  In the ED, patient was noted to be tachycardic with tremors,.  Patient also had electrolyte abnormalities and dehydration so admitted to the hospital for alcohol abuse, tremors and electrolyte imbalance.  Patient wished to continue detox protocol while in the hospital.  Assessment and Plan: * Alcohol withdrawal (HCC)- (present on admission) Patient received CIWA protocol with Ativan during hospitalization with thiamine folic acid and supportive care.  Patient has felt much better at this time without any hallucinations tremors.  He feels like he is ready for discharge home and understands that he has resources to help him after his discharge.  Hypomagnesemia- (present on admission) Improved after replacement.  Continue magnesium oxide after discharge..  Hypokalemia- (present on admission) Improved after replacement.  Potassium prior to discharge was 3.9 magnesium of 2.4.  We will continue potassium for few more days on discharge.  Sinus tachycardia- (present on admission) Resolved at this  time.  Likely secondary to alcohol withdrawal.  CTA without any PE.  Received IV fluid.   On CIWA protocol.  Nausea & vomiting- (present on admission) Improved.  Tolerated oral diet.  Alcoholic cirrhosis of liver without ascites (HCC)- (present on admission) Liver function test stable at this time.  Counseled against alcohol   Consultants: None Procedures performed: None Disposition: Home Diet recommendation:  Discharge Diet Orders (From admission, onward)     Start     Ordered   07/05/21 0000  Diet - low sodium heart healthy        07/05/21 0816           Cardiac diet  DISCHARGE MEDICATION: Allergies as of 07/05/2021   No Known Allergies      Medication List     STOP taking these medications    chlordiazePOXIDE 25 MG capsule Commonly known as: LIBRIUM       TAKE these medications    escitalopram 20 MG tablet Commonly known as: LEXAPRO Take 20 mg by mouth daily.   folic acid 1 MG tablet Commonly known as: FOLVITE Take 1 tablet (1 mg total) by mouth daily.   magnesium oxide 400 (240 Mg) MG tablet Commonly known as: MAG-OX Take 1 tablet (400 mg total) by mouth daily.   multivitamin with minerals Tabs tablet Take 1 tablet by mouth daily.   ondansetron 4 MG tablet Commonly known as: ZOFRAN Take 1 tablet (4 mg total) by mouth every 6 (six) hours as needed for nausea.   potassium chloride SA 20 MEQ tablet Commonly known as: KLOR-CON M Take 1 tablet (20 mEq total) by mouth daily.   thiamine 100 MG tablet Take 1 tablet (100  mg total) by mouth daily.   vitamin B-12 1000 MCG tablet Commonly known as: CYANOCOBALAMIN Take 2 tablets (2,000 mcg total) by mouth daily.   vitamin C 1000 MG tablet Take 2,000 mg by mouth daily.   Vitamin D3 50 MCG (2000 UT) capsule Take 2,000 Units by mouth daily.   zinc gluconate 50 MG tablet Take 50 mg by mouth daily.        Follow-up Information     Farris Has, MD Follow up in 1 week(s).   Specialty: Family  Medicine Contact information: [address] [place] [address] [phone]                Subjective.  Patient feels better today.  Denies any hallucinations dizziness lightheadedness tremors.  Wishes to go home.  Discharge Exam: Filed Weights   07/01/21 1326  Weight: 99.8 kg   General:  Average built, not in obvious distress HENT:   No scleral pallor or icterus noted. Oral mucosa is moist.  Chest:  Clear breath sounds.  Diminished breath sounds bilaterally. No crackles or wheezes.  CVS: S1 &S2 heard. No murmur.  Regular rate and rhythm. Abdomen: Soft, nontender, nondistended.  Bowel sounds are heard.   Extremities: No cyanosis, clubbing or edema.  Peripheral pulses are palpable. Psych: Alert, awake and oriented, normal mood CNS:  No cranial nerve deficits.  Power equal in all extremities.   Skin: Warm and dry.  No rashes noted.  Condition at discharge: good  The results of significant diagnostics from this hospitalization (including imaging, microbiology, ancillary and laboratory) are listed below for reference.   Imaging Studies: DG Chest 2 View  Result Date: 07/01/2021 CLINICAL DATA:  Chest pain, shortness of breath. EXAM: CHEST - 2 VIEW COMPARISON:  Chest x-ray dated 11/19/2018 FINDINGS: Heart size and mediastinal contours are stable. Lungs are clear. No pleural effusion or pneumothorax is seen. Osseous structures about the chest are unremarkable. IMPRESSION: No active cardiopulmonary disease. Electronically Signed   By: Bary Richard M.D.   On: 07/01/2021 13:39   CT Angio Chest PE W and/or Wo Contrast  Result Date: 07/01/2021 CLINICAL DATA:  Pulmonary embolus suspected. Unknown D-dimer. Chest pain. EXAM: CT ANGIOGRAPHY CHEST WITH CONTRAST TECHNIQUE: Multidetector CT imaging of the chest was performed using the standard protocol during bolus administration of intravenous contrast. Multiplanar CT image reconstructions and MIPs were obtained to  evaluate the vascular anatomy. Initial scan was nondiagnostic for pulmonary embolus and repeat imaging was obtained. RADIATION DOSE REDUCTION: This exam was performed according to the departmental dose-optimization program which includes automated exposure control, adjustment of the mA and/or kV according to patient size and/or use of iterative reconstruction technique. CONTRAST:  33mL OMNIPAQUE IOHEXOL 350 MG/ML SOLN COMPARISON:  Chest radiograph 07/01/2021 FINDINGS: Cardiovascular: In spite of 2 attempts at contrast injection, the contrast bolus in the pulmonary arteries is suboptimal, limiting evaluation for pulmonary embolus. No large central pulmonary emboli are demonstrated but segmental and subsegmental emboli could be obscured due to technique. Normal heart size. No pericardial effusions. Normal caliber thoracic aorta. No aneurysm or dissection. Great vessel origins are patent. Mediastinum/Nodes: Esophagus is decompressed. No significant lymphadenopathy. Thyroid gland is unremarkable. Lungs/Pleura: Lungs are clear and expanded. No pleural effusions. No pneumothorax. Upper Abdomen: No acute abnormalities are demonstrated in the visualized upper abdomen. Musculoskeletal: No acute bony abnormalities are identified. Probable old rib fractures. Review of the MIP images confirms the above findings. IMPRESSION: 1. The diagnostic quality of the examination is limited due  to poor contrast bolus. No large central pulmonary emboli are demonstrated but segmental or subsegmental emboli cannot be excluded. 2. No evidence of active pulmonary disease. Electronically Signed   By: Burman Nieves M.D.   On: 07/01/2021 17:25    Microbiology: Results for orders placed or performed during the hospital encounter of 07/01/21  Resp Panel by RT-PCR (Flu A&B, Covid) Nasopharyngeal Swab     Status: None   Collection Time: 07/01/21  9:03 PM   Specimen: Nasopharyngeal Swab; Nasopharyngeal(NP) swabs in vial transport medium   Result Value Ref Range Status   SARS Coronavirus 2 by RT PCR NEGATIVE NEGATIVE Final    Comment: (NOTE) SARS-CoV-2 target nucleic acids are NOT DETECTED.  The SARS-CoV-2 RNA is generally detectable in upper respiratory specimens during the acute phase of infection. The lowest concentration of SARS-CoV-2 viral copies this assay can detect is 138 copies/mL. A negative result does not preclude SARS-Cov-2 infection and should not be used as the sole basis for treatment or other patient management decisions. A negative result may occur with  improper specimen collection/handling, submission of specimen other than nasopharyngeal swab, presence of viral mutation(s) within the areas targeted by this assay, and inadequate number of viral copies(<138 copies/mL). A negative result must be combined with clinical observations, patient history, and epidemiological information. The expected result is Negative.  Fact Sheet for Patients:  [employer]Course[employer]com  Fact Sheet for Healthcare Providers:  [employer]iousBroker.it  This test is no t yet approved or cleared by the [place] FDA and  has been authorized for detection and/or diagnosis of SARS-CoV-2 by FDA under an Emergency Use Authorization (EUA). This EUA will remain  in effect (meaning this test can be used) for the duration of the COVID-19 declaration under Section 564(b)(1) of the Act, 21 U.S.C.section 360bbb-3(b)(1), unless the authorization is terminated  or revoked sooner.       Influenza A by PCR NEGATIVE NEGATIVE Final   Influenza B by PCR NEGATIVE NEGATIVE Final    Comment: (NOTE) The Xpert Xpress SARS-CoV-2/FLU/RSV plus assay is intended as an aid in the diagnosis of influenza from Nasopharyngeal swab specimens and should not be used as a sole basis for treatment. Nasal washings and aspirates are unacceptable for Xpert Xpress SARS-CoV-2/FLU/RSV testing.  Fact Sheet for  Patients: BloggerCourse.com  Fact Sheet for Healthcare Providers: SeriousBroker.it  This test is not yet approved or cleared by the [place] FDA and has been authorized for detection and/or diagnosis of SARS-CoV-2 by FDA under an Emergency Use Authorization (EUA). This EUA will remain in effect (meaning this test can be used) for the duration of the COVID-19 declaration under Section 564(b)(1) of the Act, 21 U.S.C. section 360bbb-3(b)(1), unless the authorization is terminated or revoked.  Performed at [facility], [address], [place], [place] [address]     Labs: CBC: Recent Labs  Lab 07/01/21 1406 07/02/21 0535 07/04/21 0457  WBC 15.0* 7.7 11.4*  NEUTROABS 12.7*  --   --   HGB 16.7 13.6 14.5  HCT 49.6 41.5 43.9  MCV 94.3 96.5 96.5  PLT 323 219 182   Basic Metabolic Panel: Recent Labs  Lab 07/01/21 1406 07/01/21 1523 07/02/21 0535 07/03/21 0804 07/04/21 0457  NA 136  --  136 137 139  K 3.2*  --  3.1* 3.5 3.9  CL 93*  --  98 103 105  CO2 22  --  30 28 29   GLUCOSE 86  --  95 100* 98  BUN 17  --  13 6 10   CREATININE 1.10  --  0.94 0.74 0.83  CALCIUM 8.6*  --  8.3* 8.4* 8.5*  MG  --  1.3*  --  2.1 2.4   Liver Function Tests: Recent Labs  Lab 07/01/21 1406  AST 38  ALT 33  ALKPHOS 56  BILITOT 0.8  PROT 7.5  ALBUMIN 4.6   CBG: No results for input(s): GLUCAP in the last 168 hours.  Discharge time spent: greater than 30 minutes.  Signed: 08/29/21, MD [facility] 07/05/2021

## 2021-09-14 ENCOUNTER — Inpatient Hospital Stay (HOSPITAL_COMMUNITY)
Admission: EM | Admit: 2021-09-14 | Discharge: 2021-09-17 | DRG: 897 | Disposition: A | Discharge status: Home / Self Care | Payer: BC Managed Care – PPO | Attending: Internal Medicine | Admitting: Internal Medicine

## 2021-09-14 ENCOUNTER — Encounter (HOSPITAL_COMMUNITY): Payer: Self-pay

## 2021-09-14 ENCOUNTER — Other Ambulatory Visit: Payer: Self-pay

## 2021-09-14 DIAGNOSIS — R112 Nausea with vomiting, unspecified: Secondary | ICD-10-CM | POA: Diagnosis present

## 2021-09-14 DIAGNOSIS — F10129 Alcohol abuse with intoxication, unspecified: Secondary | ICD-10-CM | POA: Diagnosis present

## 2021-09-14 DIAGNOSIS — F419 Anxiety disorder, unspecified: Secondary | ICD-10-CM | POA: Diagnosis present

## 2021-09-14 DIAGNOSIS — E8729 Other acidosis: Secondary | ICD-10-CM | POA: Diagnosis not present

## 2021-09-14 DIAGNOSIS — Y902 Blood alcohol level of 40-59 mg/100 ml: Secondary | ICD-10-CM | POA: Diagnosis present

## 2021-09-14 DIAGNOSIS — N179 Acute kidney failure, unspecified: Secondary | ICD-10-CM | POA: Diagnosis present

## 2021-09-14 DIAGNOSIS — D72829 Elevated white blood cell count, unspecified: Secondary | ICD-10-CM | POA: Diagnosis present

## 2021-09-14 DIAGNOSIS — F10930 Alcohol use, unspecified with withdrawal, uncomplicated: Secondary | ICD-10-CM | POA: Diagnosis not present

## 2021-09-14 DIAGNOSIS — Z6832 Body mass index (BMI) 32.0-32.9, adult: Secondary | ICD-10-CM

## 2021-09-14 DIAGNOSIS — E669 Obesity, unspecified: Secondary | ICD-10-CM | POA: Diagnosis present

## 2021-09-14 DIAGNOSIS — K7030 Alcoholic cirrhosis of liver without ascites: Secondary | ICD-10-CM | POA: Diagnosis present

## 2021-09-14 DIAGNOSIS — E872 Acidosis, unspecified: Secondary | ICD-10-CM | POA: Diagnosis present

## 2021-09-14 DIAGNOSIS — F1010 Alcohol abuse, uncomplicated: Principal | ICD-10-CM

## 2021-09-14 DIAGNOSIS — D6959 Other secondary thrombocytopenia: Secondary | ICD-10-CM | POA: Diagnosis present

## 2021-09-14 DIAGNOSIS — E860 Dehydration: Secondary | ICD-10-CM | POA: Diagnosis present

## 2021-09-14 DIAGNOSIS — F10131 Alcohol abuse with withdrawal delirium: Secondary | ICD-10-CM | POA: Diagnosis not present

## 2021-09-14 DIAGNOSIS — D751 Secondary polycythemia: Secondary | ICD-10-CM | POA: Diagnosis present

## 2021-09-14 DIAGNOSIS — F10939 Alcohol use, unspecified with withdrawal, unspecified: Secondary | ICD-10-CM | POA: Diagnosis present

## 2021-09-14 DIAGNOSIS — E66811 Obesity, class 1: Secondary | ICD-10-CM | POA: Diagnosis present

## 2021-09-14 DIAGNOSIS — Z79899 Other long term (current) drug therapy: Secondary | ICD-10-CM

## 2021-09-14 DIAGNOSIS — F32A Depression, unspecified: Secondary | ICD-10-CM | POA: Diagnosis present

## 2021-09-14 DIAGNOSIS — K760 Fatty (change of) liver, not elsewhere classified: Secondary | ICD-10-CM | POA: Diagnosis present

## 2021-09-14 DIAGNOSIS — G9341 Metabolic encephalopathy: Secondary | ICD-10-CM | POA: Diagnosis present

## 2021-09-14 DIAGNOSIS — R000 Tachycardia, unspecified: Secondary | ICD-10-CM | POA: Diagnosis present

## 2021-09-14 HISTORY — DX: Obesity, class 1: E66.811

## 2021-09-14 HISTORY — DX: Obesity, unspecified: E66.9

## 2021-09-14 LAB — CBC WITH DIFFERENTIAL/PLATELET
Abs Immature Granulocytes: 0.08 10*3/uL — ABNORMAL HIGH (ref 0.00–0.07)
Basophils Absolute: 0.1 10*3/uL (ref 0.0–0.1)
Basophils Relative: 1 %
Eosinophils Absolute: 0 10*3/uL (ref 0.0–0.5)
Eosinophils Relative: 0 %
HCT: 53.2 % — ABNORMAL HIGH (ref 39.0–52.0)
Hemoglobin: 18 g/dL — ABNORMAL HIGH (ref 13.0–17.0)
Immature Granulocytes: 1 %
Lymphocytes Relative: 9 %
Lymphs Abs: 1.6 10*3/uL (ref 0.7–4.0)
MCH: 30.1 pg (ref 26.0–34.0)
MCHC: 33.8 g/dL (ref 30.0–36.0)
MCV: 89 fL (ref 80.0–100.0)
Monocytes Absolute: 0.7 10*3/uL (ref 0.1–1.0)
Monocytes Relative: 4 %
Neutro Abs: 14.4 10*3/uL — ABNORMAL HIGH (ref 1.7–7.7)
Neutrophils Relative %: 85 %
Platelets: 307 10*3/uL (ref 150–400)
RBC: 5.98 MIL/uL — ABNORMAL HIGH (ref 4.22–5.81)
RDW: 12.8 % (ref 11.5–15.5)
WBC: 16.8 10*3/uL — ABNORMAL HIGH (ref 4.0–10.5)
nRBC: 0 % (ref 0.0–0.2)

## 2021-09-14 LAB — COMPREHENSIVE METABOLIC PANEL
ALT: 35 U/L (ref 0–44)
AST: 41 U/L (ref 15–41)
Albumin: 4.5 g/dL (ref 3.5–5.0)
Alkaline Phosphatase: 51 U/L (ref 38–126)
Anion gap: 21 — ABNORMAL HIGH (ref 5–15)
BUN: 17 mg/dL (ref 6–20)
CO2: 20 mmol/L — ABNORMAL LOW (ref 22–32)
Calcium: 8.4 mg/dL — ABNORMAL LOW (ref 8.9–10.3)
Chloride: 98 mmol/L (ref 98–111)
Creatinine, Ser: 1.28 mg/dL — ABNORMAL HIGH (ref 0.61–1.24)
GFR, Estimated: >60 mL/min (ref >60)
Glucose, Bld: 105 mg/dL — ABNORMAL HIGH (ref 70–99)
Potassium: 4.1 mmol/L (ref 3.5–5.1)
Sodium: 139 mmol/L (ref 135–145)
Total Bilirubin: 1.2 mg/dL (ref 0.3–1.2)
Total Protein: 7.7 g/dL (ref 6.5–8.1)

## 2021-09-14 LAB — MAGNESIUM: Magnesium: 1.8 mg/dL (ref 1.7–2.4)

## 2021-09-14 LAB — RAPID URINE DRUG SCREEN, HOSP PERFORMED
Amphetamines: NOT DETECTED
Barbiturates: NOT DETECTED
Benzodiazepines: POSITIVE — AB
Cocaine: NOT DETECTED
Opiates: NOT DETECTED
Tetrahydrocannabinol: NOT DETECTED

## 2021-09-14 LAB — ETHANOL: Alcohol, Ethyl (B): 53 mg/dL — ABNORMAL HIGH (ref <10)

## 2021-09-14 LAB — PHOSPHORUS: Phosphorus: 2.7 mg/dL (ref 2.5–4.6)

## 2021-09-14 MED ORDER — LORAZEPAM 2 MG/ML IJ SOLN
2.0000 mg | Freq: Once | INTRAMUSCULAR | Status: AC
  Administered 2021-09-14: 2 mg via INTRAVENOUS
  Filled 2021-09-14: qty 1

## 2021-09-14 MED ORDER — LORAZEPAM 2 MG/ML IJ SOLN
1.0000 mg | INTRAMUSCULAR | Status: AC | PRN
  Administered 2021-09-14 – 2021-09-15 (×3): 2 mg via INTRAVENOUS
  Administered 2021-09-15: 3 mg via INTRAVENOUS
  Administered 2021-09-15: 2 mg via INTRAVENOUS
  Administered 2021-09-16: 1 mg via INTRAVENOUS
  Filled 2021-09-14: qty 1
  Filled 2021-09-14: qty 2
  Filled 2021-09-14 (×6): qty 1

## 2021-09-14 MED ORDER — ONDANSETRON HCL 4 MG/2ML IJ SOLN
4.0000 mg | Freq: Four times a day (QID) | INTRAMUSCULAR | Status: DC | PRN
  Administered 2021-09-14: 4 mg via INTRAVENOUS
  Filled 2021-09-14: qty 2

## 2021-09-14 MED ORDER — LORAZEPAM 2 MG/ML IJ SOLN
0.0000 mg | Freq: Three times a day (TID) | INTRAMUSCULAR | Status: DC
  Administered 2021-09-16 – 2021-09-17 (×3): 2 mg via INTRAVENOUS
  Filled 2021-09-14 (×3): qty 1

## 2021-09-14 MED ORDER — ADULT MULTIVITAMIN W/MINERALS CH
1.0000 | ORAL_TABLET | Freq: Every day | ORAL | Status: DC
  Administered 2021-09-14 – 2021-09-17 (×4): 1 via ORAL
  Filled 2021-09-14 (×4): qty 1

## 2021-09-14 MED ORDER — LORAZEPAM 2 MG/ML IJ SOLN: 0.0000 mg | Freq: Two times a day (BID) | INTRAMUSCULAR | Status: DC

## 2021-09-14 MED ORDER — FOLIC ACID 1 MG PO TABS
1.0000 mg | ORAL_TABLET | Freq: Every day | ORAL | Status: DC
  Administered 2021-09-14 – 2021-09-17 (×4): 1 mg via ORAL
  Filled 2021-09-14 (×4): qty 1

## 2021-09-14 MED ORDER — LACTATED RINGERS IV BOLUS
2000.0000 mL | Freq: Once | INTRAVENOUS | Status: AC
  Administered 2021-09-14: 2000 mL via INTRAVENOUS

## 2021-09-14 MED ORDER — LACTATED RINGERS IV SOLN: INTRAVENOUS | Status: DC

## 2021-09-14 MED ORDER — THIAMINE HCL 100 MG/ML IJ SOLN: 100.0000 mg | Freq: Every day | INTRAMUSCULAR | Status: DC

## 2021-09-14 MED ORDER — ONDANSETRON HCL 4 MG/2ML IJ SOLN
4.0000 mg | Freq: Once | INTRAMUSCULAR | Status: AC
Start: 2021-09-14 — End: 2021-09-14
  Administered 2021-09-14: 4 mg via INTRAVENOUS
  Filled 2021-09-14: qty 2

## 2021-09-14 MED ORDER — THIAMINE HCL 100 MG PO TABS
100.0000 mg | ORAL_TABLET | Freq: Every day | ORAL | Status: DC
  Administered 2021-09-14 – 2021-09-17 (×4): 100 mg via ORAL
  Filled 2021-09-14 (×4): qty 1

## 2021-09-14 MED ORDER — MAGNESIUM SULFATE 2 GM/50ML IV SOLN
2.0000 g | Freq: Once | INTRAVENOUS | Status: AC
  Administered 2021-09-14: 2 g via INTRAVENOUS
  Filled 2021-09-14: qty 50

## 2021-09-14 MED ORDER — ACETAMINOPHEN 325 MG PO TABS
650.0000 mg | ORAL_TABLET | Freq: Four times a day (QID) | ORAL | Status: DC | PRN
  Administered 2021-09-15: 650 mg via ORAL
  Filled 2021-09-14: qty 2

## 2021-09-14 MED ORDER — ONDANSETRON HCL 4 MG PO TABS: 4.0000 mg | ORAL_TABLET | Freq: Four times a day (QID) | ORAL | Status: DC | PRN

## 2021-09-14 MED ORDER — LORAZEPAM 1 MG PO TABS: 1.0000 mg | ORAL_TABLET | ORAL | Status: DC | PRN

## 2021-09-14 MED ORDER — LORAZEPAM 1 MG PO TABS
1.0000 mg | ORAL_TABLET | ORAL | Status: AC | PRN
  Administered 2021-09-15: 1 mg via ORAL
  Administered 2021-09-16 (×2): 2 mg via ORAL
  Filled 2021-09-14 (×2): qty 2
  Filled 2021-09-14: qty 1

## 2021-09-14 MED ORDER — LORAZEPAM 2 MG/ML IJ SOLN: 0.0000 mg | Freq: Four times a day (QID) | INTRAMUSCULAR | Status: DC

## 2021-09-14 MED ORDER — ACETAMINOPHEN 650 MG RE SUPP: 650.0000 mg | Freq: Four times a day (QID) | RECTAL | Status: DC | PRN

## 2021-09-14 MED ORDER — LORAZEPAM 2 MG/ML IJ SOLN
0.0000 mg | INTRAMUSCULAR | Status: AC
  Administered 2021-09-14 – 2021-09-16 (×10): 2 mg via INTRAVENOUS
  Filled 2021-09-14 (×8): qty 1

## 2021-09-14 MED ORDER — LORAZEPAM 2 MG/ML IJ SOLN: 1.0000 mg | INTRAMUSCULAR | Status: DC | PRN

## 2021-09-14 MED ORDER — LORAZEPAM 2 MG/ML IJ SOLN
2.0000 mg | Freq: Once | INTRAMUSCULAR | Status: AC
Start: 2021-09-14 — End: 2021-09-14
  Administered 2021-09-14: 2 mg via INTRAVENOUS
  Filled 2021-09-14: qty 1

## 2021-09-14 MED ORDER — METOPROLOL TARTRATE 5 MG/5ML IV SOLN
5.0000 mg | Freq: Once | INTRAVENOUS | Status: AC
  Administered 2021-09-14: 5 mg via INTRAVENOUS
  Filled 2021-09-14: qty 5

## 2021-09-14 NOTE — ED Provider Notes (Signed)
?[facility]-EMERGENCY DEPT ?Provider Note ? ? ?CSN: [id number] ?Arrival date & time: 09/14/21  [id number] ? ?  ? ?History ? ?No chief complaint on file. ? ? ?Jermaine [name] is a 56 y.o. male. ? ?56 year old male with history of alcohol addiction presents with withdrawal symptoms.  States his last drink was over 24 hours ago.  States that over the last several days he has drink heavy amount of vodka.  Notes he has had nonbilious emesis denies any black stools.  Slight abdominal discomfort.  No fever or chills.  No other illicit drug use.  Endorses tremors and just generalized malaise.  No treatment used for this prior to arrival ? ? ?  ? ?Home Medications ?Prior to Admission medications   ?Medication Sig Start Date End Date Taking? Authorizing Provider  ?Ascorbic Acid (VITAMIN C) 1000 MG tablet Take 2,000 mg by mouth daily. 05/17/21   [provider]  ?Cholecalciferol (VITAMIN D3) 50 MCG (2000 UT) capsule Take 2,000 Units by mouth daily. 05/17/21   [provider]  ?escitalopram (LEXAPRO) 20 MG tablet Take 20 mg by mouth daily. 02/27/19   [provider]  ?folic acid (FOLVITE) 1 MG tablet Take 1 tablet (1 mg total) by mouth daily. ?Patient not taking: Reported on 07/01/2021 03/22/19   Merlene LaughterSheikh, Omair Latif, DO  ?Multiple Vitamin (MULTIVITAMIN WITH MINERALS) TABS tablet Take 1 tablet by mouth daily. 02/06/19   Pearson GrippeKim, James, MD  ?ondansetron (ZOFRAN) 4 MG tablet Take 1 tablet (4 mg total) by mouth every 6 (six) hours as needed for nausea. 07/05/21   Pokhrel, Rebekah ChesterfieldLaxman, MD  ?potassium chloride SA (KLOR-CON M) 20 MEQ tablet Take 1 tablet (20 mEq total) by mouth daily. 07/05/21   Pokhrel, Rebekah ChesterfieldLaxman, MD  ?thiamine 100 MG tablet Take 1 tablet (100 mg total) by mouth daily. 07/05/21   Pokhrel, Rebekah ChesterfieldLaxman, MD  ?vitamin B-12 (CYANOCOBALAMIN) 1000 MCG tablet Take 2 tablets (2,000 mcg total) by mouth daily. 03/22/19   Marguerita MerlesSheikh, Omair Latif, DO  ?zinc gluconate 50 MG tablet Take 50 mg by mouth daily. 05/17/21    [provider]  ?   ? ?Allergies    ?Patient has no known allergies.   ? ?Review of Systems   ?Review of Systems  ?All other systems reviewed and are negative. ? ?Physical Exam ?Updated Vital Signs ?BP (!) 126/96   Pulse (!) 144   Temp 98.7 ?F (37.1 ?C) (Oral)   Resp (!) 24   Ht 1.778 m (5\' 10" )   Wt 99.8 kg   SpO2 97%   BMI 31.57 kg/m?  ?Physical Exam ?Vitals and nursing note reviewed.  ?Constitutional:   ?   General: He is not in acute distress. ?   Appearance: Normal appearance. He is well-developed. He is not toxic-appearing.  ?HENT:  ?   Head: Normocephalic and atraumatic.  ?Eyes:  ?   General: Lids are normal.  ?   Conjunctiva/sclera: Conjunctivae normal.  ?   Pupils: Pupils are equal, round, and reactive to light.  ?Neck:  ?   Thyroid: No thyroid mass.  ?   Trachea: No tracheal deviation.  ?Cardiovascular:  ?   Rate and Rhythm: Regular rhythm. Tachycardia present.  ?   Heart sounds: Normal heart sounds. No murmur heard. ?  No gallop.  ?Pulmonary:  ?   Effort: Pulmonary effort is normal. No respiratory distress.  ?   Breath sounds: Normal breath sounds. No stridor. No decreased breath sounds, wheezing, rhonchi or rales.  ?Abdominal:  ?  General: There is no distension.  ?   Palpations: Abdomen is soft.  ?   Tenderness: There is no abdominal tenderness. There is no rebound.  ?Musculoskeletal:     ?   General: No tenderness. Normal range of motion.  ?   Cervical back: Normal range of motion and neck supple.  ?Skin: ?   General: Skin is warm and dry.  ?   Findings: No abrasion or rash.  ?Neurological:  ?   General: No focal deficit present.  ?   Mental Status: He is alert and oriented to person, place, and time. Mental status is at baseline.  ?   GCS: GCS eye subscore is 4. GCS verbal subscore is 5. GCS motor subscore is 6.  ?   Cranial Nerves: No cranial nerve deficit.  ?   Sensory: No sensory deficit.  ?   Motor: Tremor present.  ?Psychiatric:     ?   Attention and Perception: Attention  normal.     ?   Mood and Affect: Mood is anxious.     ?   Speech: Speech normal.     ?   Behavior: Behavior normal.  ? ? ?ED Results / Procedures / Treatments   ?Labs ?(all labs ordered are listed, but only abnormal results are displayed) ?Labs Reviewed  ?ETHANOL  ?CBC WITH DIFFERENTIAL/PLATELET  ?RAPID URINE DRUG SCREEN, HOSP PERFORMED  ?COMPREHENSIVE METABOLIC PANEL  ?MAGNESIUM  ? ? ?EKG ?EKG Interpretation ? ?Date/Time:  Thursday September 14 2021 10:07:37 EDT ?Ventricular Rate:  143 ?PR Interval:  104 ?QRS Duration: 83 ?QT Interval:  297 ?QTC Calculation: 459 ?R Axis:   101 ?Text Interpretation: Sinus tachycardia Atrial premature complex Right axis deviation Confirmed by Lorre Nick ([id number]) on 09/14/2021 10:39:41 AM ? ?Radiology ?No results found. ? ?Procedures ?Procedures  ? ? ?Medications Ordered in ED ?Medications  ?lactated ringers bolus 2,000 mL (has no administration in time range)  ?lactated ringers infusion (has no administration in time range)  ?LORazepam (ATIVAN) injection 2 mg (has no administration in time range)  ?ondansetron (ZOFRAN) injection 4 mg (has no administration in time range)  ? ? ?ED Course/ Medical Decision Making/ A&P ?  ?                        ?Medical Decision Making ?Amount and/or Complexity of Data Reviewed ?Labs: ordered. ? ?Risk ?OTC drugs. ?Prescription drug management. ? ?Patient's EKG per my interpretation shows sinus tachycardia. ?Patient presents with severe signs of alcohol withdrawal.  His CIWA score is 19.  He was treated with IV fluids as well as IV Ativan x2.  Patient's heart rate has decreased following treatment.  His alcohol level here is 53.  I feel that he is once again experiencing alcohol withdrawal.  Does have an anion gap on his electrolytes.  Patient will require inpatient hospitalization ? ?CRITICAL CARE ?Performed by: Toy Baker ?Total critical care time: 60 minutes ?Critical care time was exclusive of separately billable procedures and treating other  patients. ?Critical care was necessary to treat or prevent imminent or life-threatening deterioration. ?Critical care was time spent personally by me on the following activities: development of treatment plan with patient and/or surrogate as well as nursing, discussions with consultants, evaluation of patient's response to treatment, examination of patient, obtaining history from patient or surrogate, ordering and performing treatments and interventions, ordering and review of laboratory studies, ordering and review of radiographic studies, pulse oximetry and re-evaluation of patient's  condition. ? ? ? ? ? ? ? ? ?Final Clinical Impression(s) / ED Diagnoses ?Final diagnoses:  ?None  ? ? ?Rx / DC Orders ?ED Discharge Orders   ? ? None  ? ?  ? ? ?  ?Lorre Nick, MD ?09/14/21 1159 ? ?

## 2021-09-14 NOTE — Plan of Care (Signed)

## 2021-09-14 NOTE — H&P (Signed)
?History and Physical  ? ? ?Patient: Jermaine [name] OJJ:[id number] DOB: 1965-09-03 ?DOA: 09/14/2021 ?DOS: the patient was seen and examined on 09/14/2021 ?PCP: Farris Has, MD  ?Patient coming from: Home ? ?Chief Complaint: Alcohol withdrawal. ? ?HPI: Jermaine [name] is a 56 y.o. male with medical history significant of alcohol abuse manifesting as alcohol binges, anxiety, depression, alcoholic liver cirrhosis, alcohol-related thrombocytopenia, class I obesity who has been drinking 1/5 of alcohol daily since Sunday and is coming to the emergency department with complaints of 4 episodes of nausea/emesis of bilious content since this morning, anxiety, tremors, fatigue, malaise, generalized weakness and was found to be tachycardic with a heart rate of 141 bpm while in triage.  He denied fever, chills, abdominal pain, diarrhea, constipation, melena or hematochezia.  His urine this morning was darker than usual, but no flank pain, dysuria, frequency or hematuria.  No chest pain, dyspnea, palpitations, dizziness or diaphoresis.  No PND, orthopnea or pitting edema of the lower extremities.  No polyuria, polydipsia, polyphagia or blurred vision.  Denied auditory or visual hallucinations. ? ?Jermaine course: Initial vital signs were temperature 98.7 ?F, pulse 144, respirations 24, BP 126/96 mmHg and O2 sat 97% on room air.  The patient has received lorazepam 2 mg IVP x2 and 2000 mL of LR bolus.  I added magnesium sulfate 2 g IVPB, metoprolol 5 mg IVP x1 and another LR 2000 mL bolus over 4 hours. ? ?Lab work: CBC showed white count 16.8, hemoglobin 18.0 g/dL platelets 937.  CMP showed a CO2 of 20 mmol/L with an anion gap of 21.  Glucose 105, creatinine 1.28 and calcium 8.4 mg/dL.  The rest of the CMP was normal.  Alcohol level was 53, magnesium 1.8 and phosphorus 2.7 mg/dL. ?  ?Review of Systems: As mentioned in the history of present illness. All other systems reviewed and are negative. ?Past Medical History:  ?Diagnosis Date  ?  Alcohol abuse   ? Anxiety   ? Depression   ? ?Past Surgical History:  ?Procedure Laterality Date  ? HERNIA REPAIR    ? ?Social History:  reports that he has never smoked. He has never used smokeless tobacco. He reports current alcohol use of about 20.0 standard drinks per week. He reports that he does not currently use drugs. ? ?No Known Allergies ? ?Family History  ?Adopted: Yes  ? ? ?Prior to Admission medications   ?Medication Sig Start Date End Date Taking? Authorizing Provider  ?Ascorbic Acid (VITAMIN C) 1000 MG tablet Take 2,000 mg by mouth daily. 05/17/21   [provider]  ?Cholecalciferol (VITAMIN D3) 50 MCG (2000 UT) capsule Take 2,000 Units by mouth daily. 05/17/21   [provider]  ?escitalopram (LEXAPRO) 20 MG tablet Take 20 mg by mouth daily. 02/27/19   [provider]  ?folic acid (FOLVITE) 1 MG tablet Take 1 tablet (1 mg total) by mouth daily. ?Patient not taking: Reported on 07/01/2021 03/22/19   Merlene Laughter, DO  ?Multiple Vitamin (MULTIVITAMIN WITH MINERALS) TABS tablet Take 1 tablet by mouth daily. 02/06/19   Pearson Grippe, MD  ?ondansetron (ZOFRAN) 4 MG tablet Take 1 tablet (4 mg total) by mouth every 6 (six) hours as needed for nausea. 07/05/21   Pokhrel, Rebekah Chesterfield, MD  ?potassium chloride SA (KLOR-CON M) 20 MEQ tablet Take 1 tablet (20 mEq total) by mouth daily. 07/05/21   Pokhrel, Rebekah Chesterfield, MD  ?thiamine 100 MG tablet Take 1 tablet (100 mg total) by mouth daily. 07/05/21   Pokhrel, Rebekah Chesterfield, MD  ?  vitamin B-12 (CYANOCOBALAMIN) 1000 MCG tablet Take 2 tablets (2,000 mcg total) by mouth daily. 03/22/19   Marguerita Merles Latif, DO  ?zinc gluconate 50 MG tablet Take 50 mg by mouth daily. 05/17/21   [provider]  ? ? ?Physical Exam: ?Vitals:  ? 09/14/21 1011 09/14/21 1048 09/14/21 1130 09/14/21 1200  ?BP: (!) 126/96 (!) 138/99 (!) 141/84 (!) 122/92  ?Pulse: (!) 144 (!) 133 (!) 115 (!) 116  ?Resp:  (!) 22 (!) 21 20  ?Temp:  98.7 ?F (37.1 ?C)    ?TempSrc:  Oral    ?SpO2:  98%  90% 92%  ?Weight:      ?Height:      ? ?Physical Exam ?Vitals reviewed.  ?HENT:  ?   Head: Normocephalic.  ?   Nose: Nose normal.  ?   Mouth/Throat:  ?   Mouth: Mucous membranes are moist.  ?   Comments: Lips are dry. ?Eyes:  ?   Pupils: Pupils are equal, round, and reactive to light.  ?Neck:  ?   Vascular: No JVD.  ?Cardiovascular:  ?   Rate and Rhythm: Normal rate and regular rhythm.  ?   Heart sounds: Normal heart sounds, S1 normal and S2 normal.  ?Pulmonary:  ?   Effort: Pulmonary effort is normal.  ?   Breath sounds: Normal breath sounds.  ?Abdominal:  ?   General: Bowel sounds are normal. There is no distension.  ?   Palpations: Abdomen is soft.  ?   Tenderness: There is no abdominal tenderness. There is no guarding.  ?Musculoskeletal:  ?   Cervical back: Neck supple.  ?   Right lower leg: No edema.  ?   Left lower leg: No edema.  ?   Comments: Mild to moderate generalized weakness.  ?Skin: ?   General: Skin is warm and dry.  ?Neurological:  ?   General: No focal deficit present.  ?   Mental Status: He is alert and oriented to person, place, and time.  ?   Motor: Tremor present.  ?   Comments: Positive tongue fasciculations and mild to moderate tremors.  ?Psychiatric:     ?   Attention and Perception: He does not perceive auditory or visual hallucinations.     ?   Mood and Affect: Mood is anxious.     ?   Behavior: Behavior normal. Behavior is cooperative.  ? ?Data Reviewed: ? ?There are no new results to review at this time. ? ?EKG: ?Vent. rate 143 BPM ?PR interval 104 ms ?QRS duration 83 ms ?QT/QTcB 297/459 ms ?P-R-T axes 70 101 59 ?Sinus tachycardia ?Atrial premature complex ?Right axis deviation ? ?Assessment and Plan: ?Principal Problem: ?  Alcohol withdrawal (HCC) ?Presenting with: ?  Alcohol abuse with intoxication (HCC) ?History of: ?  Alcoholic cirrhosis of liver without ascites (HCC) ?Observation/PCU. ?Continue IV fluids. ?Magnesium sulfate 2 g IVPB. ?Folate, MVI and thiamine. ?CIWA protocol with  lorazepam. ?Consult transitional care team. ? ?Active Problems: ?  Nausea & vomiting ?Continue IV fluids. ?Antiemetics as needed. ?Pantoprazole IV. ?Treat metabolic acidosis. ? ?  High anion gap metabolic acidosis ?Secondary to alcohol consumption. ?Continue IV fluids. ? ?  Sinus tachycardia ?Due to dehydration and EtOH withdrawal. ?Continue rehydration with IV fluids. ?Continue EtOH withdrawal treatment. ?Metoprolol 5 mg IVP x1 dose. ? ?  Anxiety and depression ?Continue escitalopram 20 mg p.o. daily. ?Follow-up with PCP and [facility] as an outpatient ? ?  Hypocalcemia ?Repeat calcium level after rehydration in AM. ? ?  Leukocytosis ?No fever or chills. ?Likely hemoconcentrated. ?Continue IV fluids. ?Monitor clinically. ? ?  Secondary polycythemia ?Baseline hemoglobin around 14 g/dL. ?Continue IV fluids. ?Repeat CBC in the morning. ? ?  Class 1 obesity ?With a BMI of 31.57 kg/m?. ?Lifestyle modifications. ?Follow-up with primary care provider. ? ? ? Advance Care Planning:   Code Status: Full Code  ? ?Consults:  ? ?Family Communication:  ? ?Severity of Illness: ?The appropriate patient status for this patient is OBSERVATION. Observation status is judged to be reasonable and necessary in order to provide the required intensity of service to ensure the patient's safety. The patient's presenting symptoms, physical exam findings, and initial radiographic and laboratory data in the context of their medical condition is felt to place them at decreased risk for further clinical deterioration. Furthermore, it is anticipated that the patient will be medically stable for discharge from the hospital within 2 midnights of admission.  ? ?Author: ?Bobette Moavid Manuel , MD ?09/14/2021 12:24 PM ? ?For on call review [email or web address].  ? ?This document was prepared using Dragon voice recognition software and may contain some unintended transcription errors. ?

## 2021-09-14 NOTE — Progress Notes (Signed)
Substance abuse resources was added to pt's AVS. ? ?Arlie Solomons., MSW, [place] ?[facility]  Transitions of Care ?Clinical Social Worker I ?Direct Dial: [phone]  Fax: [phone] [email or web address]  ?

## 2021-09-14 NOTE — ED Triage Notes (Signed)
Patient states he last drank alcohol last night. Patient states he normally drinks 8 shots a day. ?HR in in triage-141. Patient is very anxious. ? ?

## 2021-09-14 NOTE — ED Notes (Signed)
Pt attempted to urinate using a urinal and could not. Pt did drink a cup of water already. ?

## 2021-09-15 DIAGNOSIS — R000 Tachycardia, unspecified: Secondary | ICD-10-CM | POA: Diagnosis present

## 2021-09-15 DIAGNOSIS — E872 Acidosis, unspecified: Secondary | ICD-10-CM | POA: Diagnosis present

## 2021-09-15 DIAGNOSIS — D751 Secondary polycythemia: Secondary | ICD-10-CM

## 2021-09-15 DIAGNOSIS — F10131 Alcohol abuse with withdrawal delirium: Secondary | ICD-10-CM | POA: Diagnosis present

## 2021-09-15 DIAGNOSIS — K7030 Alcoholic cirrhosis of liver without ascites: Secondary | ICD-10-CM

## 2021-09-15 DIAGNOSIS — F32A Depression, unspecified: Secondary | ICD-10-CM | POA: Diagnosis present

## 2021-09-15 DIAGNOSIS — F10930 Alcohol use, unspecified with withdrawal, uncomplicated: Secondary | ICD-10-CM | POA: Diagnosis not present

## 2021-09-15 DIAGNOSIS — F10939 Alcohol use, unspecified with withdrawal, unspecified: Secondary | ICD-10-CM | POA: Diagnosis present

## 2021-09-15 DIAGNOSIS — E8729 Other acidosis: Secondary | ICD-10-CM | POA: Diagnosis not present

## 2021-09-15 DIAGNOSIS — Y902 Blood alcohol level of 40-59 mg/100 ml: Secondary | ICD-10-CM | POA: Diagnosis present

## 2021-09-15 DIAGNOSIS — D72829 Elevated white blood cell count, unspecified: Secondary | ICD-10-CM | POA: Diagnosis present

## 2021-09-15 DIAGNOSIS — Z79899 Other long term (current) drug therapy: Secondary | ICD-10-CM | POA: Diagnosis not present

## 2021-09-15 DIAGNOSIS — E669 Obesity, unspecified: Secondary | ICD-10-CM | POA: Diagnosis present

## 2021-09-15 DIAGNOSIS — Z6832 Body mass index (BMI) 32.0-32.9, adult: Secondary | ICD-10-CM | POA: Diagnosis not present

## 2021-09-15 DIAGNOSIS — N179 Acute kidney failure, unspecified: Secondary | ICD-10-CM | POA: Diagnosis present

## 2021-09-15 DIAGNOSIS — E860 Dehydration: Secondary | ICD-10-CM | POA: Diagnosis present

## 2021-09-15 DIAGNOSIS — F419 Anxiety disorder, unspecified: Secondary | ICD-10-CM | POA: Diagnosis present

## 2021-09-15 DIAGNOSIS — D6959 Other secondary thrombocytopenia: Secondary | ICD-10-CM | POA: Diagnosis present

## 2021-09-15 DIAGNOSIS — F10129 Alcohol abuse with intoxication, unspecified: Secondary | ICD-10-CM | POA: Diagnosis present

## 2021-09-15 LAB — CBC WITH DIFFERENTIAL/PLATELET
Abs Immature Granulocytes: 0.03 10*3/uL (ref 0.00–0.07)
Basophils Absolute: 0 10*3/uL (ref 0.0–0.1)
Basophils Relative: 0 %
Eosinophils Absolute: 0.1 10*3/uL (ref 0.0–0.5)
Eosinophils Relative: 0 %
HCT: 46.3 % (ref 39.0–52.0)
Hemoglobin: 15.2 g/dL (ref 13.0–17.0)
Immature Granulocytes: 0 %
Lymphocytes Relative: 15 %
Lymphs Abs: 1.7 10*3/uL (ref 0.7–4.0)
MCH: 29.9 pg (ref 26.0–34.0)
MCHC: 32.8 g/dL (ref 30.0–36.0)
MCV: 91 fL (ref 80.0–100.0)
Monocytes Absolute: 1 10*3/uL (ref 0.1–1.0)
Monocytes Relative: 9 %
Neutro Abs: 8.7 10*3/uL — ABNORMAL HIGH (ref 1.7–7.7)
Neutrophils Relative %: 76 %
Platelets: 195 10*3/uL (ref 150–400)
RBC: 5.09 MIL/uL (ref 4.22–5.81)
RDW: 12.5 % (ref 11.5–15.5)
WBC: 11.5 10*3/uL — ABNORMAL HIGH (ref 4.0–10.5)
nRBC: 0 % (ref 0.0–0.2)

## 2021-09-15 LAB — HIV ANTIBODY (ROUTINE TESTING W REFLEX): HIV Screen 4th Generation wRfx: NONREACTIVE

## 2021-09-15 LAB — COMPREHENSIVE METABOLIC PANEL
ALT: 23 U/L (ref 0–44)
AST: 29 U/L (ref 15–41)
Albumin: 3.5 g/dL (ref 3.5–5.0)
Alkaline Phosphatase: 39 U/L (ref 38–126)
Anion gap: 6 (ref 5–15)
BUN: 13 mg/dL (ref 6–20)
CO2: 29 mmol/L (ref 22–32)
Calcium: 7.9 mg/dL — ABNORMAL LOW (ref 8.9–10.3)
Chloride: 102 mmol/L (ref 98–111)
Creatinine, Ser: 1.04 mg/dL (ref 0.61–1.24)
GFR, Estimated: >60 mL/min (ref >60)
Glucose, Bld: 103 mg/dL — ABNORMAL HIGH (ref 70–99)
Potassium: 3.6 mmol/L (ref 3.5–5.1)
Sodium: 137 mmol/L (ref 135–145)
Total Bilirubin: 1 mg/dL (ref 0.3–1.2)
Total Protein: 5.7 g/dL — ABNORMAL LOW (ref 6.5–8.1)

## 2021-09-15 LAB — PHOSPHORUS: Phosphorus: 2.4 mg/dL — ABNORMAL LOW (ref 2.5–4.6)

## 2021-09-15 MED ORDER — DEXTROSE 5 % IV SOLN: INTRAVENOUS | Status: AC

## 2021-09-15 MED ORDER — ESCITALOPRAM OXALATE 20 MG PO TABS
20.0000 mg | ORAL_TABLET | Freq: Every day | ORAL | Status: DC
  Administered 2021-09-15 – 2021-09-17 (×3): 20 mg via ORAL
  Filled 2021-09-15 (×3): qty 1

## 2021-09-15 MED ORDER — K PHOS MONO-SOD PHOS DI & MONO 155-852-130 MG PO TABS
500.0000 mg | ORAL_TABLET | Freq: Three times a day (TID) | ORAL | Status: DC
  Administered 2021-09-15 – 2021-09-17 (×7): 500 mg via ORAL
  Filled 2021-09-15 (×9): qty 2

## 2021-09-15 NOTE — Plan of Care (Signed)

## 2021-09-15 NOTE — Progress Notes (Signed)
[facility] ?PROGRESS NOTE ? ? ? ?Progress Note  ?Jermaine [name]  NFA:[id number] DOB: 1966/02/17 DOA: 09/14/2021 ?PCP: Farris Has, MD  ? ? ? ?Brief Narrative:  ? ?Jermaine [name] is an 56 y.o. male past medical history significant for alcohol abuse, Estelle June and depression alcoholic liver disease alcoholic related thrombocytopenia was been drinking 1/5 of alcohol since Sunday comes in for nausea and vomiting, was found tachycardic in the ED he was fluid resuscitated in the ED started on IV withdrawal protocol given thiamine and folate. ? ? ? ? ?Assessment/Plan:  ? ?Alcohol withdrawal (HCC)/  Alcohol abuse with intoxication: ?On thiamine and folate, has been fluid resuscitated. ?Started on the CIWA protocol. ?Change IV fluids to D5. ?CIWA score still elevated continue Ativan. ?He relates he would like to eat. ? ?Nausea and vomiting: ?Likely resolved continue antiemetics and IV fluids. ?Once he is able to take orals can switch Protonix to orals. ? ?High anion gap metabolic acidosis: ?Likely due to alcohol and probably lactic acidosis. ?Check lactate continue aggressive IV fluid resuscitation. ? ?Sinus tachycardia: ?Likely due to dehydration and alcohol withdrawal. ?On metoprolol IV as needed has resolved with fluid resuscitation. ? ?Anxiety and depression: ?Continue Lexapro 20 to follow-up with psychiatry as an outpatient. ? ?LeukoCytosis: ?Likely reactive has remained afebrile. ?Tmax was 98. ? ?Polycythemia: ?Likely due to hemoconcentration check a CBC with differential. ? ?Hypocalcemia: ?Corrected for his albumin is normal. ? ? ?DVT prophylaxis: lovenox ?Family Communication:none ?Status is: Observation ?The patient will require care spanning > 2 midnights and should be moved to inpatient because: Severe alcohol withdrawal with acute kidney injury. ? ? ? ?Code Status:  ? ?  ?Code Status Orders  ?(From admission, onward)  ?  ? ? ?  ? ?  Start     Ordered  ? 09/14/21 1218  Full code  Continuous       ? 09/14/21 1224   ? ?  ?  ? ?  ? ?Code Status History   ? ? Date Active Date Inactive Code Status Order ID Comments User Context  ? 07/01/2021 2003 07/05/2021 1623 Full Code [id number]  Hillary Bow, DO ED  ? 03/18/2019 0927 03/22/2019 1546 Full Code [id number]  Alwyn Ren, MD ED  ? 02/05/2019 1104 02/06/2019 1513 Full Code [id number]  Briscoe Deutscher, MD ED  ? 11/19/2018 1331 11/23/2018 1741 Full Code [id number]  Hughie Closs, MD Inpatient  ? 11/16/2018 1329 11/18/2018 1846 Full Code [id number]  Burnadette Pop, MD ED  ? 11/16/2018 0803 11/16/2018 1329 Full Code [id number]  Jeannie Fend, PA-C ED  ? ?  ? ? ? ? ?IV Access:  ? ?Peripheral IV ? ? ?Procedures and diagnostic studies:  ? ?No results found. ? ? ?Medical Consultants:  ? ?None. ? ? ?Subjective:  ? ? ?Jermaine [name] sleepy complaining he is hungry. ? ?Objective:  ? ? ?Vitals:  ? 09/14/21 1954 09/14/21 2141 09/14/21 2300 09/15/21 0322  ?BP: 122/87 123/78 (!) 135/95 140/83  ?Pulse: (!) 102 99 76 99  ?Resp: 20  20 20   ?Temp: 98.6 ?F (37 ?C)  97.8 ?F (36.6 ?C) 98.2 ?F (36.8 ?C)  ?TempSrc:   Oral Oral  ?SpO2: 92%  96% 98%  ?Weight: 101.2 kg     ?Height: 5\' 10"  (1.778 m)     ? ?SpO2: 98 % ? ? ?Intake/Output Summary (Last 24 hours) at 09/15/2021 0706 ?Last data filed at 09/15/2021 0500 ?Gross per 24 hour  ?Intake 2295.42 ml  ?Output 500 ml  ?  Net 1795.42 ml  ? ?Filed Weights  ? 09/14/21 1008 09/14/21 1954  ?Weight: 99.8 kg 101.2 kg  ? ? ?Exam: ?General exam: In no acute distress. ?Respiratory system: Good air movement and clear to auscultation. ?Cardiovascular system: S1 & S2 heard, RRR. No JVD. ?Gastrointestinal system: Abdomen is nondistended, soft and nontender.  ?Extremities: No pedal edema. ?Skin: No rashes, lesions or ulcers ? ? ?Data Reviewed:  ? ? ?Labs: ?Basic Metabolic Panel: ?Recent Labs  ?Lab 09/14/21 ?1059 09/15/21 ?0357  ?NA 139 137  ?K 4.1 3.6  ?CL 98 102  ?CO2 20* 29  ?GLUCOSE 105* 103*  ?BUN 17 13  ?CREATININE 1.28* 1.04  ?CALCIUM 8.4* 7.9*  ?MG 1.8  --   ?PHOS 2.7 2.4*   ? ?GFR ?Estimated Creatinine Clearance: 95.7 mL/min (by C-G formula based on SCr of 1.04 mg/dL). ?Liver Function Tests: ?Recent Labs  ?Lab 09/14/21 ?1059 09/15/21 ?0357  ?AST 41 29  ?ALT 35 23  ?ALKPHOS 51 39  ?BILITOT 1.2 1.0  ?PROT 7.7 5.7*  ?ALBUMIN 4.5 3.5  ? ?No results for input(s): LIPASE, AMYLASE in the last 168 hours. ?No results for input(s): AMMONIA in the last 168 hours. ?Coagulation profile ?No results for input(s): INR, PROTIME in the last 168 hours. ?COVID-19 Labs ? ?No results for input(s): DDIMER, FERRITIN, LDH, CRP in the last 72 hours. ? ?Lab Results  ?Component Value Date  ? SARSCOV2NAA NEGATIVE 07/01/2021  ? SARSCOV2NAA NEGATIVE 03/18/2019  ? SARSCOV2NAA NEGATIVE 02/05/2019  ? SARSCOV2NAA NEGATIVE 11/19/2018  ? ? ?CBC: ?Recent Labs  ?Lab 09/14/21 ?1059  ?WBC 16.8*  ?NEUTROABS 14.4*  ?HGB 18.0*  ?HCT 53.2*  ?MCV 89.0  ?PLT 307  ? ?Cardiac Enzymes: ?No results for input(s): CKTOTAL, CKMB, CKMBINDEX, TROPONINI in the last 168 hours. ?BNP (last 3 results) ?No results for input(s): PROBNP in the last 8760 hours. ?CBG: ?No results for input(s): GLUCAP in the last 168 hours. ?D-Dimer: ?No results for input(s): DDIMER in the last 72 hours. ?Hgb A1c: ?No results for input(s): HGBA1C in the last 72 hours. ?Lipid Profile: ?No results for input(s): CHOL, HDL, LDLCALC, TRIG, CHOLHDL, LDLDIRECT in the last 72 hours. ?Thyroid function studies: ?No results for input(s): TSH, T4TOTAL, T3FREE, THYROIDAB in the last 72 hours. ? ?Invalid input(s): FREET3 ?Anemia work up: ?No results for input(s): VITAMINB12, FOLATE, FERRITIN, TIBC, IRON, RETICCTPCT in the last 72 hours. ?Sepsis Labs: ?Recent Labs  ?Lab 09/14/21 ?1059  ?WBC 16.8*  ? ?Microbiology ?No results found for this or any previous visit (from the past 240 hour(s)). ? ? ?Medications:  ? ? folic acid  1 mg Oral Daily  ? LORazepam  0-4 mg Intravenous Q4H  ? Followed by  ? Melene Muller ON 09/16/2021] LORazepam  0-4 mg Intravenous Q8H  ? multivitamin with minerals  1  tablet Oral Daily  ? thiamine  100 mg Oral Daily  ? Or  ? thiamine  100 mg Intravenous Daily  ? ?Continuous Infusions: ? lactated ringers 125 mL/hr at 09/14/21 2002  ? ? ? ? LOS: 0 days  ? ?Darin Engels David Stall  ?[facility] ? ?09/15/2021, 7:06 AM  ? ? ? ? ? ? ?

## 2021-09-16 DIAGNOSIS — K7030 Alcoholic cirrhosis of liver without ascites: Secondary | ICD-10-CM | POA: Diagnosis not present

## 2021-09-16 DIAGNOSIS — F10930 Alcohol use, unspecified with withdrawal, uncomplicated: Secondary | ICD-10-CM | POA: Diagnosis not present

## 2021-09-16 DIAGNOSIS — F10129 Alcohol abuse with intoxication, unspecified: Secondary | ICD-10-CM | POA: Diagnosis not present

## 2021-09-16 LAB — BASIC METABOLIC PANEL
Anion gap: 5 (ref 5–15)
BUN: 7 mg/dL (ref 6–20)
CO2: 32 mmol/L (ref 22–32)
Calcium: 8.2 mg/dL — ABNORMAL LOW (ref 8.9–10.3)
Chloride: 101 mmol/L (ref 98–111)
Creatinine, Ser: 1.03 mg/dL (ref 0.61–1.24)
GFR, Estimated: >60 mL/min (ref >60)
Glucose, Bld: 99 mg/dL (ref 70–99)
Potassium: 3.7 mmol/L (ref 3.5–5.1)
Sodium: 138 mmol/L (ref 135–145)

## 2021-09-16 LAB — PHOSPHORUS: Phosphorus: 3.3 mg/dL (ref 2.5–4.6)

## 2021-09-16 MED ORDER — CHLORDIAZEPOXIDE HCL 5 MG PO CAPS
25.0000 mg | ORAL_CAPSULE | Freq: Three times a day (TID) | ORAL | Status: AC
  Administered 2021-09-16 (×2): 25 mg via ORAL
  Filled 2021-09-16 (×2): qty 5

## 2021-09-16 NOTE — Progress Notes (Signed)
[facility] ?PROGRESS NOTE ? ? ? ?Progress Note  ?Jermaine [name]  RUE:[id number] DOB: [birth year] DOA: 09/14/2021 ?PCP: Farris Has, MD  ? ? ? ?Brief Narrative:  ? ?Jermaine [name] is an 56 y.o. male past medical history significant for alcohol abuse, Estelle June and depression alcoholic liver disease alcoholic related thrombocytopenia was been drinking 1/5 of alcohol since Sunday comes in for nausea and vomiting, was found tachycardic in the ED he was fluid resuscitated in the ED started on IV withdrawal protocol given thiamine and folate. ? ? ? ? ?Assessment/Plan:  ? ?Alcohol withdrawal (HCC)/  Alcohol abuse with intoxication: ?On thiamine and folate, has been fluid resuscitated. ?CIWA score still elevated continue Ativan.  Will 2 doses of Librium reevaluate in the afternoon. ?afternoon.  KVO IV fluids patient able to hydrate orally. ? ?Nausea and vomiting: ?Likely resolved continue antiemetics and IV fluids. ?Once he is able to take orals can switch Protonix to orals. ? ?High anion gap metabolic acidosis: ?Likely due to alcohol and probably lactic acidosis. ?Check lactate continue aggressive IV fluid resuscitation. ? ?Sinus tachycardia: ?Likely due to dehydration and alcohol withdrawal. ?On metoprolol IV as needed has resolved with fluid resuscitation. ? ?Anxiety and depression: ?Continue Lexapro 20 to follow-up with psychiatry as an outpatient. ? ?LeukoCytosis: ?Likely reactive has remained afebrile. ?Tmax was 98. ? ?Polycythemia: ?Likely due to hemoconcentration check a CBC with differential. ? ?Hypocalcemia: ?Corrected for his albumin is normal. ? ? ?DVT prophylaxis: lovenox ?Family Communication:none ?Status is: Observation ?The patient will require care spanning > 2 midnights and should be moved to inpatient because: Severe alcohol withdrawal with acute kidney injury. ? ? ? ?Code Status:  ? ?  ?Code Status Orders  ?(From admission, onward)  ?  ? ? ?  ? ?  Start     Ordered  ? 09/14/21 1218  Full code  Continuous        ? 09/14/21 1224  ? ?  ?  ? ?  ? ?Code Status History   ? ? Date Active Date Inactive Code Status Order ID Comments User Context  ? 07/01/2021 2003 07/05/2021 1623 Full Code [id number]  Hillary Bow, DO ED  ? 03/18/2019 0927 03/22/2019 1546 Full Code [id number]  Alwyn Ren, MD ED  ? 02/05/2019 1104 02/06/2019 1513 Full Code [id number]  Briscoe Deutscher, MD ED  ? 11/19/2018 1331 11/23/2018 1741 Full Code [id number]  Hughie Closs, MD Inpatient  ? 11/16/2018 1329 11/18/2018 1846 Full Code [id number]  Burnadette Pop, MD ED  ? 11/16/2018 0803 11/16/2018 1329 Full Code [id number]  Jeannie Fend, PA-C ED  ? ?  ? ? ? ? ?IV Access:  ? ?Peripheral IV ? ? ?Procedures and diagnostic studies:  ? ?No results found. ? ? ?Medical Consultants:  ? ?None. ? ? ?Subjective:  ? ? ?[name] tolerating his diet. ? ?Objective:  ? ? ?Vitals:  ? 09/15/21 1800 09/15/21 1916 09/16/21 0005 09/16/21 0309  ?BP: 129/86 124/83 (!) 132/92 (!) 127/92  ?Pulse: 98 86 70 78  ?Resp: (!) 21 18 18 16   ?Temp: 98.2 ?F (36.8 ?C) 98.2 ?F (36.8 ?C) 97.7 ?F (36.5 ?C) (!) 97.5 ?F (36.4 ?C)  ?TempSrc: Oral Oral Oral Oral  ?SpO2: 98% 98% 99% 96%  ?Weight:      ?Height:      ? ?SpO2: 96 % ? ? ?Intake/Output Summary (Last 24 hours) at 09/16/2021 0830 ?Last data filed at 09/16/2021 0250 ?Gross per 24 hour  ?Intake 1036.68 ml  ?Output 2150  ml  ?Net -1113.32 ml  ? ? ?Filed Weights  ? 09/14/21 1008 09/14/21 1954  ?Weight: 99.8 kg 101.2 kg  ? ? ?Exam: ?General exam: In no acute distress. ?Respiratory system: Good air movement and clear to auscultation. ?Cardiovascular system: S1 & S2 heard, RRR. No JVD. ?Gastrointestinal system: Abdomen is nondistended, soft and nontender.  ?Extremities: No pedal edema. ?Skin: No rashes, lesions or ulcers ?Psychiatry: Judgement and insight appear normal. Mood & affect appropriate. ? ? ?Data Reviewed:  ? ? ?Labs: ?Basic Metabolic Panel: ?Recent Labs  ?Lab 09/14/21 ?1059 09/15/21 ?0357 09/16/21 ?0333  ?NA 139 137 138  ?K 4.1 3.6 3.7   ?CL 98 102 101  ?CO2 20* 29 32  ?GLUCOSE 105* 103* 99  ?BUN 17 13 7   ?CREATININE 1.28* 1.04 1.03  ?CALCIUM 8.4* 7.9* 8.2*  ?MG 1.8  --   --   ?PHOS 2.7 2.4* 3.3  ? ? ?GFR ?Estimated Creatinine Clearance: 96.6 mL/min (by C-G formula based on SCr of 1.03 mg/dL). ?Liver Function Tests: ?Recent Labs  ?Lab 09/14/21 ?1059 09/15/21 ?0357  ?AST 41 29  ?ALT 35 23  ?ALKPHOS 51 39  ?BILITOT 1.2 1.0  ?PROT 7.7 5.7*  ?ALBUMIN 4.5 3.5  ? ? ?No results for input(s): LIPASE, AMYLASE in the last 168 hours. ?No results for input(s): AMMONIA in the last 168 hours. ?Coagulation profile ?No results for input(s): INR, PROTIME in the last 168 hours. ?COVID-19 Labs ? ?No results for input(s): DDIMER, FERRITIN, LDH, CRP in the last 72 hours. ? ?Lab Results  ?Component Value Date  ? SARSCOV2NAA NEGATIVE 07/01/2021  ? SARSCOV2NAA NEGATIVE 03/18/2019  ? SARSCOV2NAA NEGATIVE 02/05/2019  ? SARSCOV2NAA NEGATIVE 11/19/2018  ? ? ?CBC: ?Recent Labs  ?Lab 09/14/21 ?1059 09/15/21 ?0828  ?WBC 16.8* 11.5*  ?NEUTROABS 14.4* 8.7*  ?HGB 18.0* 15.2  ?HCT 53.2* 46.3  ?MCV 89.0 91.0  ?PLT 307 195  ? ? ?Cardiac Enzymes: ?No results for input(s): CKTOTAL, CKMB, CKMBINDEX, TROPONINI in the last 168 hours. ?BNP (last 3 results) ?No results for input(s): PROBNP in the last 8760 hours. ?CBG: ?No results for input(s): GLUCAP in the last 168 hours. ?D-Dimer: ?No results for input(s): DDIMER in the last 72 hours. ?Hgb A1c: ?No results for input(s): HGBA1C in the last 72 hours. ?Lipid Profile: ?No results for input(s): CHOL, HDL, LDLCALC, TRIG, CHOLHDL, LDLDIRECT in the last 72 hours. ?Thyroid function studies: ?No results for input(s): TSH, T4TOTAL, T3FREE, THYROIDAB in the last 72 hours. ? ?Invalid input(s): FREET3 ?Anemia work up: ?No results for input(s): VITAMINB12, FOLATE, FERRITIN, TIBC, IRON, RETICCTPCT in the last 72 hours. ?Sepsis Labs: ?Recent Labs  ?Lab 09/14/21 ?1059 09/15/21 ?0828  ?WBC 16.8* 11.5*  ? ? ?Microbiology ?No results found for this or any  previous visit (from the past 240 hour(s)). ? ? ?Medications:  ? ? escitalopram  20 mg Oral Daily  ? folic acid  1 mg Oral Daily  ? LORazepam  0-4 mg Intravenous Q4H  ? Followed by  ? LORazepam  0-4 mg Intravenous Q8H  ? multivitamin with minerals  1 tablet Oral Daily  ? phosphorus  500 mg Oral TID  ? thiamine  100 mg Oral Daily  ? Or  ? thiamine  100 mg Intravenous Daily  ? ?Continuous Infusions: ? ? ? ? ? LOS: 1 day  ? ?09/17/21 Darin Engels  ?[facility] ? ?09/16/2021, 8:30 AM  ? ? ? ? ? ? ?

## 2021-09-16 NOTE — Plan of Care (Signed)

## 2021-09-17 DIAGNOSIS — F419 Anxiety disorder, unspecified: Secondary | ICD-10-CM | POA: Diagnosis not present

## 2021-09-17 DIAGNOSIS — F10930 Alcohol use, unspecified with withdrawal, uncomplicated: Secondary | ICD-10-CM | POA: Diagnosis not present

## 2021-09-17 DIAGNOSIS — E8729 Other acidosis: Secondary | ICD-10-CM | POA: Diagnosis not present

## 2021-09-17 DIAGNOSIS — F10129 Alcohol abuse with intoxication, unspecified: Secondary | ICD-10-CM | POA: Diagnosis not present

## 2021-09-17 LAB — BASIC METABOLIC PANEL
Anion gap: 6 (ref 5–15)
BUN: 9 mg/dL (ref 6–20)
CO2: 29 mmol/L (ref 22–32)
Calcium: 8.6 mg/dL — ABNORMAL LOW (ref 8.9–10.3)
Chloride: 104 mmol/L (ref 98–111)
Creatinine, Ser: 0.9 mg/dL (ref 0.61–1.24)
GFR, Estimated: >60 mL/min (ref >60)
Glucose, Bld: 86 mg/dL (ref 70–99)
Potassium: 3.6 mmol/L (ref 3.5–5.1)
Sodium: 139 mmol/L (ref 135–145)

## 2021-09-17 NOTE — Discharge Summary (Signed)
Physician Discharge Summary  ?Jermaine [name] [id number] DOB: 11-08-1965 DOA: 09/14/2021 ? ?PCP: London Pepper, MD ? ?Admit date: 09/14/2021 ?Discharge date: 09/17/2021 ? ?Admitted From: Home ?Disposition:  Home ? ?Recommendations for Outpatient Follow-up:  ?Follow up with PCP in 1-2 weeks ?Please obtain BMP/CBC in one week ? ? ?Home Health:Home ?Equipment/Devices:None ? ?Discharge Condition:Stable ?CODE STATUS:Full ?Diet recommendation: Heart Healthy  ? ?Brief/Interim Summary: ? 56 y.o. male past medical history significant for alcohol abuse, Lorenz Coaster and depression alcoholic liver disease alcoholic related thrombocytopenia was been drinking 1/5 of alcohol since Sunday comes in for nausea and vomiting, was found tachycardic in the ED he was fluid resuscitated in the ED started on IV withdrawal protocol given thiamine and folate. ? ?Discharge Diagnoses:  ?Principal Problem: ?  Alcohol withdrawal ([place]) ?Active Problems: ?  Alcohol abuse with intoxication ([place]) ?  Alcoholic cirrhosis of liver without ascites ([place]) ?  Anxiety and depression ?  Nausea & vomiting ?  Sinus tachycardia ?  Hypocalcemia ?  Leukocytosis ?  High anion gap metabolic acidosis ?  Class 1 obesity ?  Secondary polycythemia ? ?Alcohol withdrawal: ?Started on thiamine and folate D5's monitor with CIWA giving high doses of Ativan and 2 doses of Librium as his CIWA score is significantly high. ?After 48 hours his symptoms resolved he was tolerating his diet he will go home on thiamine and folate. ? ?Nausea and vomiting: ?Resolved likely due to alcohol withdrawal. ? ?High anion gap metabolic acidosis: ?Likely due to alcohol, will start on  fluid resuscitation now resolved. ? ?Sinus tachycardia: ?Likely due to dehydration alcohol withdrawal resolved with fluid resuscitation. ? ?Anxiety and depression: ?No changes made to his medication. ? ?Leukocytosis: ?Likely reactive. ? ?Polycythemia: ?Likely due to hemoconcentration resolved with fluid  cessation. ? ?Hypocalcemia: ?Corrected for albumin is normal. ? ? ? ?Discharge Instructions ? ?Discharge Instructions   ? ? Diet - low sodium heart healthy   Complete by: As directed ?  ? Increase activity slowly   Complete by: As directed ?  ? ?  ? ?Allergies as of 09/17/2021   ?No Known Allergies ?  ? ?  ?Medication List  ?  ? ?TAKE these medications   ? ?escitalopram 20 MG tablet ?Commonly known as: LEXAPRO ?Take 20 mg by mouth daily. ?  ?folic acid 1 MG tablet ?Commonly known as: FOLVITE ?Take 1 tablet (1 mg total) by mouth daily. ?  ?multivitamin with minerals Tabs tablet ?Take 1 tablet by mouth daily. ?  ?ondansetron 4 MG tablet ?Commonly known as: ZOFRAN ?Take 1 tablet (4 mg total) by mouth every 6 (six) hours as needed for nausea. ?  ?potassium chloride SA 20 MEQ tablet ?Commonly known as: KLOR-CON M ?Take 1 tablet (20 mEq total) by mouth daily. ?  ?testosterone cypionate 100 MG/ML injection ?Commonly known as: DEPOTESTOTERONE CYPIONATE ?Inject 100 mg into the muscle once a week. For IM use only ?  ?thiamine 100 MG tablet ?Take 1 tablet (100 mg total) by mouth daily. ?  ?vitamin B-12 1000 MCG tablet ?Commonly known as: CYANOCOBALAMIN ?Take 2 tablets (2,000 mcg total) by mouth daily. ?  ?vitamin C 1000 MG tablet ?Take 2,000 mg by mouth daily. ?  ?Vitamin D3 50 MCG (2000 UT) capsule ?Take 2,000 Units by mouth daily. ?  ? ?  ? ? ?No Known Allergies ? ?Consultations: ?None ? ? ?Procedures/Studies: ?No results found. ?(Echo, Carotid, EGD, Colonoscopy, ERCP)  ? ? ?Subjective: ?No complaints ? ?Discharge Exam: ?Vitals:  ? 09/16/21 2103 09/17/21 0547  ?  BP: 116/82 119/77  ?Pulse: 90 83  ?Resp: 20 18  ?Temp: 98.9 ?F (37.2 ?C) 97.9 ?F (36.6 ?C)  ?SpO2: 96% 95%  ? ?Vitals:  ? 09/16/21 0911 09/16/21 1352 09/16/21 2103 09/17/21 0547  ?BP: 112/78 122/86 116/82 119/77  ?Pulse: 79 81 90 83  ?Resp: 17 20 20 18   ?Temp: 99 ?F (37.2 ?C) 98.1 ?F (36.7 ?C) 98.9 ?F (37.2 ?C) 97.9 ?F (36.6 ?C)  ?TempSrc: Oral Oral Oral Oral  ?SpO2:  95% 96% 96% 95%  ?Weight:      ?Height:      ? ? ?General: Pt is alert, awake, not in acute distress ?Cardiovascular: RRR, S1/S2 +, no rubs, no gallops ?Respiratory: CTA bilaterally, no wheezing, no rhonchi ?Abdominal: Soft, NT, ND, bowel sounds + ?Extremities: no edema, no cyanosis ? ? ? ?The results of significant diagnostics from this hospitalization (including imaging, microbiology, ancillary and laboratory) are listed below for reference.   ? ? ?Microbiology: ?No results found for this or any previous visit (from the past 240 hour(s)).  ? ?Labs: ?BNP (last 3 results) ?No results for input(s): BNP in the last 8760 hours. ?Basic Metabolic Panel: ?Recent Labs  ?Lab 09/14/21 ?1059 09/15/21 ?0357 09/16/21 ?[id number] 09/17/21 ?0326  ?NA 139 137 138 139  ?K 4.1 3.6 3.7 3.6  ?CL 98 102 101 104  ?CO2 20* 29 32 29  ?GLUCOSE 105* 103* 99 86  ?BUN 17 13 7 9   ?CREATININE 1.28* 1.04 1.03 0.90  ?CALCIUM 8.4* 7.9* 8.2* 8.6*  ?MG 1.8  --   --   --   ?PHOS 2.7 2.4* 3.3  --   ? ?Liver Function Tests: ?Recent Labs  ?Lab 09/14/21 ?1059 09/15/21 ?0357  ?AST 41 29  ?ALT 35 23  ?ALKPHOS 51 39  ?BILITOT 1.2 1.0  ?PROT 7.7 5.7*  ?ALBUMIN 4.5 3.5  ? ?No results for input(s): LIPASE, AMYLASE in the last 168 hours. ?No results for input(s): AMMONIA in the last 168 hours. ?CBC: ?Recent Labs  ?Lab 09/14/21 ?1059 09/15/21 ?0828  ?WBC 16.8* 11.5*  ?NEUTROABS 14.4* 8.7*  ?HGB 18.0* 15.2  ?HCT 53.2* 46.3  ?MCV 89.0 91.0  ?PLT 307 195  ? ?Cardiac Enzymes: ?No results for input(s): CKTOTAL, CKMB, CKMBINDEX, TROPONINI in the last 168 hours. ?BNP: ?Invalid input(s): POCBNP ?CBG: ?No results for input(s): GLUCAP in the last 168 hours. ?D-Dimer ?No results for input(s): DDIMER in the last 72 hours. ?Hgb A1c ?No results for input(s): HGBA1C in the last 72 hours. ?Lipid Profile ?No results for input(s): CHOL, HDL, LDLCALC, TRIG, CHOLHDL, LDLDIRECT in the last 72 hours. ?Thyroid function studies ?No results for input(s): TSH, T4TOTAL, T3FREE, THYROIDAB in the  last 72 hours. ? ?Invalid input(s): FREET3 ?Anemia work up ?No results for input(s): VITAMINB12, FOLATE, FERRITIN, TIBC, IRON, RETICCTPCT in the last 72 hours. ?Urinalysis ?   ?Component Value Date/Time  ? COLORURINE YELLOW (A) 07/01/2021 1337  ? APPEARANCEUR CLEAR (A) 07/01/2021 1337  ? LABSPEC 1.010 07/01/2021 1337  ? PHURINE 6.0 07/01/2021 1337  ? GLUCOSEU NEGATIVE 07/01/2021 1337  ? HGBUR NEGATIVE 07/01/2021 1337  ? BILIRUBINUR NEGATIVE 07/01/2021 1337  ? KETONESUR 80 (A) 07/01/2021 1337  ? PROTEINUR NEGATIVE 07/01/2021 1337  ? NITRITE NEGATIVE 07/01/2021 1337  ? LEUKOCYTESUR NEGATIVE 07/01/2021 1337  ? ?Sepsis Labs ?Invalid input(s): PROCALCITONIN,  WBC,  LACTICIDVEN ?Microbiology ?No results found for this or any previous visit (from the past 240 hour(s)). ? ? ?Time coordinating discharge: Over 30 minutes ? ?SIGNED: ? ? ?Charlynne Cousins,  MD  ?[facility] ?09/17/2021, 8:26 AM ?Pager  ? ?If 7PM-7AM, please contact night-coverage ?[email or web address] ?Password [email or web address] ? ?

## 2021-09-17 NOTE — Progress Notes (Signed)
AVS given to patient and explained at the bedside. Medications and follow up appointments have been explained with pt verbalizing understanding.  

## 2021-09-17 NOTE — Discharge Instructions (Signed)
Jermaine [name] was admitted to the Hospital on 09/14/2021 and Discharged on Discharge Date 09/17/2021 and should be excused from work/school  ? ?for 4   days starting 09/14/2021 , may return to work/school without any restrictions. ? ?Call Lambert Keto MD, [facility] [phone] with questions. ? ?Marinda Elk M.D on 09/17/2021,at 11:02 AM ? ?[facility] ?Office  [phone] ? ?

## 2021-09-20 ENCOUNTER — Emergency Department (HOSPITAL_COMMUNITY)
Admission: EM | Admit: 2021-09-20 | Discharge: 2021-09-20 | Discharge status: Against Medical Advice | Payer: BC Managed Care – PPO | Attending: Emergency Medicine | Admitting: Emergency Medicine

## 2021-09-20 ENCOUNTER — Encounter (HOSPITAL_COMMUNITY): Payer: Self-pay | Admitting: Oncology

## 2021-09-20 ENCOUNTER — Other Ambulatory Visit: Payer: Self-pay

## 2021-09-20 ENCOUNTER — Encounter (HOSPITAL_BASED_OUTPATIENT_CLINIC_OR_DEPARTMENT_OTHER): Payer: Self-pay

## 2021-09-20 ENCOUNTER — Emergency Department (HOSPITAL_COMMUNITY): Payer: BC Managed Care – PPO

## 2021-09-20 DIAGNOSIS — Z5321 Procedure and treatment not carried out due to patient leaving prior to being seen by health care provider: Secondary | ICD-10-CM | POA: Insufficient documentation

## 2021-09-20 DIAGNOSIS — M7989 Other specified soft tissue disorders: Secondary | ICD-10-CM | POA: Insufficient documentation

## 2021-09-20 DIAGNOSIS — F10939 Alcohol use, unspecified with withdrawal, unspecified: Secondary | ICD-10-CM | POA: Diagnosis not present

## 2021-09-20 LAB — CBC WITH DIFFERENTIAL/PLATELET
Abs Immature Granulocytes: 0.07 10*3/uL (ref 0.00–0.07)
Basophils Absolute: 0.1 10*3/uL (ref 0.0–0.1)
Basophils Relative: 1 %
Eosinophils Absolute: 0 10*3/uL (ref 0.0–0.5)
Eosinophils Relative: 0 %
HCT: 49.3 % (ref 39.0–52.0)
Hemoglobin: 16.1 g/dL (ref 13.0–17.0)
Immature Granulocytes: 1 %
Lymphocytes Relative: 36 %
Lymphs Abs: 3.4 10*3/uL (ref 0.7–4.0)
MCH: 29.6 pg (ref 26.0–34.0)
MCHC: 32.7 g/dL (ref 30.0–36.0)
MCV: 90.6 fL (ref 80.0–100.0)
Monocytes Absolute: 0.7 10*3/uL (ref 0.1–1.0)
Monocytes Relative: 7 %
Neutro Abs: 5.1 10*3/uL (ref 1.7–7.7)
Neutrophils Relative %: 55 %
Platelets: 251 10*3/uL (ref 150–400)
RBC: 5.44 MIL/uL (ref 4.22–5.81)
RDW: 13.3 % (ref 11.5–15.5)
WBC: 9.3 10*3/uL (ref 4.0–10.5)
nRBC: 0 % (ref 0.0–0.2)

## 2021-09-20 LAB — BASIC METABOLIC PANEL
Anion gap: 11 (ref 5–15)
BUN: 15 mg/dL (ref 6–20)
CO2: 25 mmol/L (ref 22–32)
Calcium: 8.5 mg/dL — ABNORMAL LOW (ref 8.9–10.3)
Chloride: 106 mmol/L (ref 98–111)
Creatinine, Ser: 1.1 mg/dL (ref 0.61–1.24)
GFR, Estimated: >60 mL/min (ref >60)
Glucose, Bld: 109 mg/dL — ABNORMAL HIGH (ref 70–99)
Potassium: 4.2 mmol/L (ref 3.5–5.1)
Sodium: 142 mmol/L (ref 135–145)

## 2021-09-20 NOTE — ED Triage Notes (Signed)
Pt c/o swelling to b/l hands.  Pt is heavily intoxicated and cannot provide additional information.  ?

## 2021-09-20 NOTE — ED Triage Notes (Signed)
Patient BIB [facility] from Home for Hand Swelling. ? ?Endorses recent Admission and Discharge from Hospital 3 days PTA. Since then the Patient has been having increased Swelling to his Right Hand (Patient did have PIV in Same Extremity during Hospital Admission). ? ?No Fevers.  ? ?NAD Noted during Triage. Patient endorses Drinking a Fifth of Vodka today PTA. A&Ox4. GCS 15. Ambulatory.  ?

## 2021-09-20 NOTE — ED Provider Triage Note (Signed)
Emergency Medicine Provider Triage Evaluation Note ? ?Jermaine [name] , a 56 y.o. male  was evaluated in triage.  Pt complains of right hand swelling that began after his discharge from the hospital 3 days ago.  Patient was admitted for alcohol withdrawal. He does note that the affected hand is where he had an IV placed during that time and he is concerned about the swelling that is began since then.  Denies known trauma or injury.  He states that he became very worried that something was wrong and began drinking again, but he felt much better afterwards.  He denies pain, no changes in ROM.  No treatment prior to arrival. ? ?Review of Systems  ?Positive: Hand swelling ?Negative: Fevers, chills ? ?Physical Exam  ?BP (!) 138/121 (BP Location: Right Arm)   Pulse 84   Temp 98.1 ?F (36.7 ?C) (Oral)   Resp 18   SpO2 93%  ?Gen:   Awake, no distress , Very pleasant ?Resp:  Normal effort  ?MSK:   Moves extremities without difficulty; right hand with mild diffuse swelling and erythema; no induration or fluctuance around IV site. No obvious injuries or abrasions otherwise. ROM intact, 2+ distal pulses ? ?Other:  ? ?Medical Decision Making  ?Medically screening exam initiated at 4:53 PM.  Appropriate orders placed.  Jermaine [name] was informed that the remainder of the evaluation will be completed by another provider, this initial triage assessment does not replace that evaluation, and the importance of remaining in the ED until their evaluation is complete. ? ? ?  ?Janell Quiet, [place] ?09/20/21 1657 ? ?

## 2021-09-20 NOTE — ED Notes (Signed)
Pt called for vital reassessment 2 times; pt not in the lobby during name call. ?

## 2021-09-21 ENCOUNTER — Encounter (HOSPITAL_COMMUNITY): Payer: Self-pay | Admitting: Emergency Medicine

## 2021-09-21 ENCOUNTER — Other Ambulatory Visit: Payer: Self-pay

## 2021-09-21 ENCOUNTER — Emergency Department (HOSPITAL_BASED_OUTPATIENT_CLINIC_OR_DEPARTMENT_OTHER)
Admission: EM | Admit: 2021-09-21 | Discharge: 2021-09-21 | Discharge status: Against Medical Advice | Payer: BC Managed Care – PPO | Source: Home / Self Care

## 2021-09-21 ENCOUNTER — Encounter (HOSPITAL_COMMUNITY): Payer: Self-pay

## 2021-09-21 ENCOUNTER — Emergency Department (HOSPITAL_COMMUNITY)
Admission: EM | Admit: 2021-09-21 | Discharge: 2021-09-21 | Discharge status: Against Medical Advice | Payer: BC Managed Care – PPO | Source: Home / Self Care

## 2021-09-21 ENCOUNTER — Emergency Department (HOSPITAL_COMMUNITY)
Admission: EM | Admit: 2021-09-21 | Discharge: 2021-09-21 | Disposition: A | Discharge status: Home / Self Care | Payer: BC Managed Care – PPO | Attending: Emergency Medicine | Admitting: Emergency Medicine

## 2021-09-21 DIAGNOSIS — Z5321 Procedure and treatment not carried out due to patient leaving prior to being seen by health care provider: Secondary | ICD-10-CM | POA: Insufficient documentation

## 2021-09-21 DIAGNOSIS — Y909 Presence of alcohol in blood, level not specified: Secondary | ICD-10-CM | POA: Insufficient documentation

## 2021-09-21 DIAGNOSIS — F10130 Alcohol abuse with withdrawal, uncomplicated: Secondary | ICD-10-CM | POA: Insufficient documentation

## 2021-09-21 DIAGNOSIS — L03113 Cellulitis of right upper limb: Secondary | ICD-10-CM | POA: Insufficient documentation

## 2021-09-21 DIAGNOSIS — F10129 Alcohol abuse with intoxication, unspecified: Secondary | ICD-10-CM | POA: Insufficient documentation

## 2021-09-21 DIAGNOSIS — F10920 Alcohol use, unspecified with intoxication, uncomplicated: Secondary | ICD-10-CM

## 2021-09-21 DIAGNOSIS — L0390 Cellulitis, unspecified: Secondary | ICD-10-CM

## 2021-09-21 LAB — COMPREHENSIVE METABOLIC PANEL
ALT: 27 U/L (ref 0–44)
AST: 40 U/L (ref 15–41)
Albumin: 4.3 g/dL (ref 3.5–5.0)
Alkaline Phosphatase: 50 U/L (ref 38–126)
Anion gap: 14 (ref 5–15)
BUN: 14 mg/dL (ref 6–20)
CO2: 23 mmol/L (ref 22–32)
Calcium: 8.6 mg/dL — ABNORMAL LOW (ref 8.9–10.3)
Chloride: 102 mmol/L (ref 98–111)
Creatinine, Ser: 1.12 mg/dL (ref 0.61–1.24)
GFR, Estimated: >60 mL/min (ref >60)
Glucose, Bld: 89 mg/dL (ref 70–99)
Potassium: 3.7 mmol/L (ref 3.5–5.1)
Sodium: 139 mmol/L (ref 135–145)
Total Bilirubin: 0.9 mg/dL (ref 0.3–1.2)
Total Protein: 7.2 g/dL (ref 6.5–8.1)

## 2021-09-21 LAB — ETHANOL: Alcohol, Ethyl (B): 179 mg/dL — ABNORMAL HIGH (ref <10)

## 2021-09-21 LAB — CBC WITH DIFFERENTIAL/PLATELET
Abs Immature Granulocytes: 0.08 10*3/uL — ABNORMAL HIGH (ref 0.00–0.07)
Basophils Absolute: 0.1 10*3/uL (ref 0.0–0.1)
Basophils Relative: 1 %
Eosinophils Absolute: 0 10*3/uL (ref 0.0–0.5)
Eosinophils Relative: 0 %
HCT: 48.9 % (ref 39.0–52.0)
Hemoglobin: 16 g/dL (ref 13.0–17.0)
Immature Granulocytes: 1 %
Lymphocytes Relative: 23 %
Lymphs Abs: 2.9 10*3/uL (ref 0.7–4.0)
MCH: 29.4 pg (ref 26.0–34.0)
MCHC: 32.7 g/dL (ref 30.0–36.0)
MCV: 89.7 fL (ref 80.0–100.0)
Monocytes Absolute: 0.8 10*3/uL (ref 0.1–1.0)
Monocytes Relative: 6 %
Neutro Abs: 8.8 10*3/uL — ABNORMAL HIGH (ref 1.7–7.7)
Neutrophils Relative %: 69 %
Platelets: 244 10*3/uL (ref 150–400)
RBC: 5.45 MIL/uL (ref 4.22–5.81)
RDW: 13.3 % (ref 11.5–15.5)
WBC: 12.8 10*3/uL — ABNORMAL HIGH (ref 4.0–10.5)
nRBC: 0 % (ref 0.0–0.2)

## 2021-09-21 LAB — LIPASE, BLOOD: Lipase: 34 U/L (ref 11–51)

## 2021-09-21 MED ORDER — LORAZEPAM 2 MG/ML IJ SOLN
1.0000 mg | Freq: Once | INTRAMUSCULAR | Status: AC
Start: 2021-09-21 — End: 2021-09-21
  Administered 2021-09-21: 1 mg via INTRAVENOUS
  Filled 2021-09-21: qty 1

## 2021-09-21 MED ORDER — ONDANSETRON HCL 4 MG/2ML IJ SOLN
4.0000 mg | Freq: Once | INTRAMUSCULAR | Status: AC
  Administered 2021-09-21: 4 mg via INTRAVENOUS
  Filled 2021-09-21: qty 2

## 2021-09-21 MED ORDER — CEPHALEXIN 500 MG PO CAPS: 500.0000 mg | ORAL_CAPSULE | Freq: Two times a day (BID) | ORAL | 0 refills | Status: AC

## 2021-09-21 MED ORDER — PANTOPRAZOLE SODIUM 40 MG IV SOLR
40.0000 mg | Freq: Once | INTRAVENOUS | Status: AC
  Administered 2021-09-21: 40 mg via INTRAVENOUS
  Filled 2021-09-21: qty 10

## 2021-09-21 NOTE — ED Provider Notes (Signed)
?[facility]-EMERGENCY DEPT ?Provider Note ? ? ?CSN: [id number] ?Arrival date & time: 09/21/21  0223 ? ?  ? ?History ? ?Chief Complaint  ?Patient presents with  ? Alcohol Intoxication  ? Arm Pain  ? ? ?Jermaine [name] is a 56 y.o. male. ? ?Patient drank heavily last night.  He is not feeling very well.  He is feeling fairly nauseous.  But denies any abdominal pain.  Also having some redness to his right arm at prior venipuncture site.  Denies any IV drug use.  Denies any suicidal homicidal ideation. ? ?The history is provided by the patient.  ?Alcohol Intoxication ?This is a new problem. The current episode started less than 1 hour ago. The problem occurs constantly. Nothing aggravates the symptoms. Nothing relieves the symptoms. He has tried nothing for the symptoms. The treatment provided no relief.  ? ?  ? ?Home Medications ?Prior to Admission medications   ?Medication Sig Start Date End Date Taking? Authorizing Provider  ?cephALEXin (KEFLEX) 500 MG capsule Take 1 capsule (500 mg total) by mouth 2 (two) times daily for 7 days. 09/21/21 09/28/21 Yes , , DO  ?Ascorbic Acid (VITAMIN C) 1000 MG tablet Take 2,000 mg by mouth daily. 05/17/21   [provider]  ?Cholecalciferol (VITAMIN D3) 50 MCG (2000 UT) capsule Take 2,000 Units by mouth daily. 05/17/21   [provider]  ?escitalopram (LEXAPRO) 20 MG tablet Take 20 mg by mouth daily. 02/27/19   [provider]  ?folic acid (FOLVITE) 1 MG tablet Take 1 tablet (1 mg total) by mouth daily. ?Patient not taking: Reported on 09/14/2021 03/22/19   Merlene Laughter, DO  ?Multiple Vitamin (MULTIVITAMIN WITH MINERALS) TABS tablet Take 1 tablet by mouth daily. 02/06/19   Pearson Grippe, MD  ?ondansetron (ZOFRAN) 4 MG tablet Take 1 tablet (4 mg total) by mouth every 6 (six) hours as needed for nausea. ?Patient not taking: Reported on 09/14/2021 07/05/21   Joycelyn Das, MD  ?potassium chloride SA (KLOR-CON M) 20 MEQ tablet Take 1  tablet (20 mEq total) by mouth daily. ?Patient not taking: Reported on 09/14/2021 07/05/21   Joycelyn Das, MD  ?testosterone cypionate (DEPOTESTOTERONE CYPIONATE) 100 MG/ML injection Inject 100 mg into the muscle once a week. For IM use only    [provider]  ?thiamine 100 MG tablet Take 1 tablet (100 mg total) by mouth daily. ?Patient not taking: Reported on 09/14/2021 07/05/21   Joycelyn Das, MD  ?vitamin B-12 (CYANOCOBALAMIN) 1000 MCG tablet Take 2 tablets (2,000 mcg total) by mouth daily. ?Patient not taking: Reported on 09/14/2021 03/22/19   Merlene Laughter, DO  ?   ? ?Allergies    ?Patient has no known allergies.   ? ?Review of Systems   ?Review of Systems ? ?Physical Exam ?Updated Vital Signs ?BP 114/79   Pulse (!) 107   Temp 97.8 ?F (36.6 ?C) (Oral)   Resp 14   SpO2 96%  ?Physical Exam ?Vitals and nursing note reviewed.  ?Constitutional:   ?   General: He is not in acute distress. ?   Appearance: He is well-developed.  ?HENT:  ?   Head: Normocephalic and atraumatic.  ?   Nose: Nose normal.  ?   Mouth/Throat:  ?   Mouth: Mucous membranes are moist.  ?Eyes:  ?   Extraocular Movements: Extraocular movements intact.  ?   Conjunctiva/sclera: Conjunctivae normal.  ?   Pupils: Pupils are equal, round, and reactive to light.  ?Cardiovascular:  ?  Rate and Rhythm: Normal rate and regular rhythm.  ?   Heart sounds: No murmur heard. ?Pulmonary:  ?   Effort: Pulmonary effort is normal. No respiratory distress.  ?   Breath sounds: Normal breath sounds.  ?Abdominal:  ?   General: Abdomen is flat. There is no distension.  ?   Palpations: Abdomen is soft.  ?   Tenderness: There is no abdominal tenderness.  ?Musculoskeletal:     ?   General: No swelling.  ?   Cervical back: Neck supple.  ?Skin: ?   General: Skin is warm and dry.  ?   Capillary Refill: Capillary refill takes less than 2 seconds.  ?   Comments: Some mild erythema to the right forearm but there is no purulent drainage or major swelling   ?Neurological:  ?   General: No focal deficit present.  ?   Mental Status: He is alert.  ?Psychiatric:     ?   Mood and Affect: Mood normal.  ? ? ?ED Results / Procedures / Treatments   ?Labs ?(all labs ordered are listed, but only abnormal results are displayed) ?Labs Reviewed  ?CBC WITH DIFFERENTIAL/PLATELET - Abnormal; Notable for the following components:  ?    Result Value  ? WBC 12.8 (*)   ? Neutro Abs 8.8 (*)   ? Abs Immature Granulocytes 0.08 (*)   ? All other components within normal limits  ?COMPREHENSIVE METABOLIC PANEL - Abnormal; Notable for the following components:  ? Calcium 8.6 (*)   ? All other components within normal limits  ?ETHANOL - Abnormal; Notable for the following components:  ? Alcohol, Ethyl (B) 179 (*)   ? All other components within normal limits  ?LIPASE, BLOOD  ? ? ?EKG ?EKG Interpretation ? ?Date/Time:  Thursday September 21 2021 06:17:44 EDT ?Ventricular Rate:  132 ?PR Interval:  116 ?QRS Duration: 89 ?QT Interval:  301 ?QTC Calculation: 446 ?R Axis:   97 ?Text Interpretation: Sinus tachycardia Rightward axis No significant change since last tracing Confirmed by Cathren Laine ([id number]) on 09/21/2021 6:24:34 AM ? ?Radiology ?DG Hand Complete Right ? ?Result Date: 09/20/2021 ?CLINICAL DATA:  Swelling for 3 days. EXAM: RIGHT HAND - COMPLETE 3+ VIEW COMPARISON:  None FINDINGS: There is no acute fracture or dislocation. No significant arthropathy. Mild soft tissue edema. IMPRESSION: 1. No acute bone abnormality. 2. Soft tissue edema. Electronically Signed   By: Signa Kell M.D.   On: 09/20/2021 16:50   ? ?Procedures ?Procedures  ? ? ?Medications Ordered in ED ?Medications  ?LORazepam (ATIVAN) injection 1 mg (1 mg Intravenous Given 09/21/21 0737)  ?pantoprazole (PROTONIX) injection 40 mg (40 mg Intravenous Given 09/21/21 0737)  ?ondansetron [facility]) injection 4 mg (4 mg Intravenous Given 09/21/21 0737)  ? ? ?ED Course/ Medical Decision Making/ A&P ?  ?                        ?Medical Decision  Making ?Amount and/or Complexity of Data Reviewed ?Labs: ordered. ? ?Risk ?Prescription drug management. ? ? ?Jermaine [name] is here with alcohol intoxication, nausea.  Concern for right arm infection.  Normal vitals.  No fever.  Possibly a very mild cellulitis of the right forearm.  Will treat with antibiotics.  He states that he drank fairly heavily last night and started to get nauseous.  Denies any abdominal pain.  Patient with some nausea but no vomiting.  Denies any chest pain or shortness of breath.  No abdominal tenderness  on exam.  Lab work including CBC, CMP, lipase was ordered to evaluate for pancreatitis or electrolyte abnormalities.  My suspicion is that this is likely gastritis related to alcohol use.  He denies any melena or hematochezia.  His vital signs are unremarkable. ? ?Per my review and interpretation of labs, patient with alcohol level 179 but otherwise lipase is normal.  No significant anemia or electrolyte abnormality otherwise.  EKG per my review and interpretation shows sinus rhythm.  No ischemic changes.  Overall he felt better after IV fluids, IV Protonix, IV Zofran.  Will prescribe Protonix and antibiotics.  Discharged in good condition. ? ?This chart was dictated using voice recognition software.  Despite best efforts to proofread,  errors can occur which can change the documentation meaning.  ? ? ? ? ? ? ? ?Final Clinical Impression(s) / ED Diagnoses ?Final diagnoses:  ?Alcoholic intoxication without complication (HCC)  ?Cellulitis, unspecified cellulitis site  ? ? ?Rx / DC Orders ?ED Discharge Orders   ? ?      Ordered  ?  cephALEXin (KEFLEX) 500 MG capsule  2 times daily       ? 09/21/21 0754  ? ?  ?  ? ?  ? ? ?  ?Virgina NorfolkCuratolo, , DO ?09/21/21 [id number] ? ?

## 2021-09-21 NOTE — ED Notes (Signed)
Pt stated he cannot wait any longer. I informed him that the provider will be with him as soon as possible. I strongly advised recommendation to stay for provider assessment. Pt left ED. ?

## 2021-09-21 NOTE — ED Triage Notes (Addendum)
Pt reports drinking 2/5 of Vodka last night and states that he is withdrawing. (Tremors, headache, nausea) Pt also states that his right arm/hand is swollen and he thinks that it is infected.  ?

## 2021-09-21 NOTE — ED Notes (Signed)
Urine specimen obtained, placed at bedside, holding for order.  ?

## 2021-09-21 NOTE — ED Triage Notes (Signed)
Pt reports being scared of going through alcohol withdrawal since it has happened before. Pt d/c this morning  ?

## 2021-09-21 NOTE — ED Notes (Signed)
Pt ambulatory without assistance.  

## 2022-04-22 ENCOUNTER — Emergency Department (HOSPITAL_COMMUNITY): Payer: BC Managed Care – PPO

## 2022-04-22 ENCOUNTER — Inpatient Hospital Stay (HOSPITAL_COMMUNITY)
Admission: EM | Admit: 2022-04-22 | Discharge: 2022-04-26 | DRG: 391 | Disposition: A | Discharge status: Home / Self Care | Payer: BC Managed Care – PPO | Attending: Internal Medicine | Admitting: Internal Medicine

## 2022-04-22 ENCOUNTER — Other Ambulatory Visit: Payer: Self-pay

## 2022-04-22 ENCOUNTER — Encounter (HOSPITAL_COMMUNITY): Payer: Self-pay

## 2022-04-22 DIAGNOSIS — F10931 Alcohol use, unspecified with withdrawal delirium: Secondary | ICD-10-CM | POA: Diagnosis not present

## 2022-04-22 DIAGNOSIS — K5792 Diverticulitis of intestine, part unspecified, without perforation or abscess without bleeding: Secondary | ICD-10-CM

## 2022-04-22 DIAGNOSIS — F32A Depression, unspecified: Secondary | ICD-10-CM | POA: Diagnosis present

## 2022-04-22 DIAGNOSIS — F10939 Alcohol use, unspecified with withdrawal, unspecified: Secondary | ICD-10-CM | POA: Diagnosis present

## 2022-04-22 DIAGNOSIS — E860 Dehydration: Secondary | ICD-10-CM | POA: Diagnosis present

## 2022-04-22 DIAGNOSIS — M6282 Rhabdomyolysis: Secondary | ICD-10-CM | POA: Diagnosis present

## 2022-04-22 DIAGNOSIS — K760 Fatty (change of) liver, not elsewhere classified: Secondary | ICD-10-CM | POA: Diagnosis present

## 2022-04-22 DIAGNOSIS — Z20822 Contact with and (suspected) exposure to covid-19: Secondary | ICD-10-CM | POA: Diagnosis present

## 2022-04-22 DIAGNOSIS — R0902 Hypoxemia: Secondary | ICD-10-CM

## 2022-04-22 DIAGNOSIS — K5732 Diverticulitis of large intestine without perforation or abscess without bleeding: Secondary | ICD-10-CM | POA: Diagnosis not present

## 2022-04-22 DIAGNOSIS — F10229 Alcohol dependence with intoxication, unspecified: Secondary | ICD-10-CM | POA: Diagnosis present

## 2022-04-22 DIAGNOSIS — D6959 Other secondary thrombocytopenia: Secondary | ICD-10-CM | POA: Diagnosis present

## 2022-04-22 DIAGNOSIS — Z79899 Other long term (current) drug therapy: Secondary | ICD-10-CM

## 2022-04-22 DIAGNOSIS — J9601 Acute respiratory failure with hypoxia: Secondary | ICD-10-CM | POA: Diagnosis not present

## 2022-04-22 DIAGNOSIS — G9341 Metabolic encephalopathy: Secondary | ICD-10-CM | POA: Diagnosis present

## 2022-04-22 DIAGNOSIS — R45851 Suicidal ideations: Secondary | ICD-10-CM

## 2022-04-22 DIAGNOSIS — F10231 Alcohol dependence with withdrawal delirium: Secondary | ICD-10-CM | POA: Diagnosis present

## 2022-04-22 DIAGNOSIS — F10221 Alcohol dependence with intoxication delirium: Secondary | ICD-10-CM

## 2022-04-22 DIAGNOSIS — Y908 Blood alcohol level of 240 mg/100 ml or more: Secondary | ICD-10-CM | POA: Diagnosis present

## 2022-04-22 DIAGNOSIS — Z1152 Encounter for screening for COVID-19: Secondary | ICD-10-CM

## 2022-04-22 DIAGNOSIS — F064 Anxiety disorder due to known physiological condition: Secondary | ICD-10-CM | POA: Diagnosis present

## 2022-04-22 LAB — CBC WITH DIFFERENTIAL/PLATELET
Abs Immature Granulocytes: 0.01 10*3/uL (ref 0.00–0.07)
Basophils Absolute: 0 10*3/uL (ref 0.0–0.1)
Basophils Relative: 1 %
Eosinophils Absolute: 0 10*3/uL (ref 0.0–0.5)
Eosinophils Relative: 1 %
HCT: 50.2 % (ref 39.0–52.0)
Hemoglobin: 16.9 g/dL (ref 13.0–17.0)
Immature Granulocytes: 0 %
Lymphocytes Relative: 28 %
Lymphs Abs: 1.8 10*3/uL (ref 0.7–4.0)
MCH: 30.1 pg (ref 26.0–34.0)
MCHC: 33.7 g/dL (ref 30.0–36.0)
MCV: 89.5 fL (ref 80.0–100.0)
Monocytes Absolute: 0.5 10*3/uL (ref 0.1–1.0)
Monocytes Relative: 7 %
Neutro Abs: 4.2 10*3/uL (ref 1.7–7.7)
Neutrophils Relative %: 63 %
Platelets: 155 10*3/uL (ref 150–400)
RBC: 5.61 MIL/uL (ref 4.22–5.81)
RDW: 15.6 % — ABNORMAL HIGH (ref 11.5–15.5)
WBC: 6.5 10*3/uL (ref 4.0–10.5)
nRBC: 0 % (ref 0.0–0.2)

## 2022-04-22 LAB — RESP PANEL BY RT-PCR (FLU A&B, COVID) ARPGX2
Influenza A by PCR: NEGATIVE
Influenza B by PCR: NEGATIVE
SARS Coronavirus 2 by RT PCR: NEGATIVE

## 2022-04-22 LAB — PROTIME-INR
INR: 1.1 (ref 0.8–1.2)
Prothrombin Time: 13.9 seconds (ref 11.4–15.2)

## 2022-04-22 LAB — BLOOD GAS, VENOUS
Acid-Base Excess: 3.8 mmol/L — ABNORMAL HIGH (ref 0.0–2.0)
Bicarbonate: 29.2 mmol/L — ABNORMAL HIGH (ref 20.0–28.0)
O2 Saturation: 97.5 %
Patient temperature: 37
pCO2, Ven: 46 mmHg (ref 44–60)
pH, Ven: 7.41 (ref 7.25–7.43)
pO2, Ven: 90 mmHg — ABNORMAL HIGH (ref 32–45)

## 2022-04-22 LAB — COMPREHENSIVE METABOLIC PANEL
ALT: 75 U/L — ABNORMAL HIGH (ref 0–44)
AST: 113 U/L — ABNORMAL HIGH (ref 15–41)
Albumin: 5 g/dL (ref 3.5–5.0)
Alkaline Phosphatase: 49 U/L (ref 38–126)
Anion gap: 17 — ABNORMAL HIGH (ref 5–15)
BUN: 16 mg/dL (ref 6–20)
CO2: 25 mmol/L (ref 22–32)
Calcium: 8.8 mg/dL — ABNORMAL LOW (ref 8.9–10.3)
Chloride: 96 mmol/L — ABNORMAL LOW (ref 98–111)
Creatinine, Ser: 1.01 mg/dL (ref 0.61–1.24)
GFR, Estimated: >60 mL/min (ref >60)
Glucose, Bld: 99 mg/dL (ref 70–99)
Potassium: 3.9 mmol/L (ref 3.5–5.1)
Sodium: 138 mmol/L (ref 135–145)
Total Bilirubin: 1.2 mg/dL (ref 0.3–1.2)
Total Protein: 8.1 g/dL (ref 6.5–8.1)

## 2022-04-22 LAB — PHOSPHORUS: Phosphorus: 3.5 mg/dL (ref 2.5–4.6)

## 2022-04-22 LAB — MAGNESIUM: Magnesium: 2.2 mg/dL (ref 1.7–2.4)

## 2022-04-22 LAB — TSH: TSH: 0.544 u[IU]/mL (ref 0.350–4.500)

## 2022-04-22 LAB — SALICYLATE LEVEL: Salicylate Lvl: <7 mg/dL — ABNORMAL LOW (ref 7.0–30.0)

## 2022-04-22 LAB — CK: Total CK: 425 U/L — ABNORMAL HIGH (ref 49–397)

## 2022-04-22 LAB — ETHANOL: Alcohol, Ethyl (B): 366 mg/dL (ref <10)

## 2022-04-22 LAB — ACETAMINOPHEN LEVEL: Acetaminophen (Tylenol), Serum: <10 ug/mL — ABNORMAL LOW (ref 10–30)

## 2022-04-22 MED ORDER — SODIUM CHLORIDE 0.9 % IV BOLUS
500.0000 mL | Freq: Once | INTRAVENOUS | Status: AC
  Administered 2022-04-22: 500 mL via INTRAVENOUS

## 2022-04-22 MED ORDER — LORAZEPAM 1 MG PO TABS
2.0000 mg | ORAL_TABLET | Freq: Once | ORAL | Status: AC
  Administered 2022-04-22: 2 mg via ORAL
  Filled 2022-04-22: qty 2

## 2022-04-22 MED ORDER — SODIUM CHLORIDE 0.9 % IV BOLUS
1000.0000 mL | Freq: Once | INTRAVENOUS | Status: AC
  Administered 2022-04-22: 1000 mL via INTRAVENOUS

## 2022-04-22 MED ORDER — SODIUM CHLORIDE 0.9 % IV SOLN
2.0000 g | Freq: Once | INTRAVENOUS | Status: AC
  Administered 2022-04-22: 2 g via INTRAVENOUS
  Filled 2022-04-22: qty 20

## 2022-04-22 MED ORDER — METRONIDAZOLE 500 MG/100ML IV SOLN
500.0000 mg | Freq: Once | INTRAVENOUS | Status: AC
  Administered 2022-04-22: 500 mg via INTRAVENOUS
  Filled 2022-04-22: qty 100

## 2022-04-22 MED ORDER — LORAZEPAM 2 MG/ML IJ SOLN
0.0000 mg | Freq: Two times a day (BID) | INTRAMUSCULAR | Status: AC
  Administered 2022-04-24: 2 mg via INTRAVENOUS
  Filled 2022-04-22: qty 1

## 2022-04-22 MED ORDER — THIAMINE HCL 100 MG/ML IJ SOLN
100.0000 mg | Freq: Every day | INTRAMUSCULAR | Status: DC
  Administered 2022-04-22 – 2022-04-23 (×2): 100 mg via INTRAVENOUS
  Filled 2022-04-22 (×2): qty 2

## 2022-04-22 MED ORDER — THIAMINE MONONITRATE 100 MG PO TABS
100.0000 mg | ORAL_TABLET | Freq: Every day | ORAL | Status: DC
  Administered 2022-04-24 – 2022-04-26 (×3): 100 mg via ORAL
  Filled 2022-04-22 (×4): qty 1

## 2022-04-22 MED ORDER — LORAZEPAM 1 MG PO TABS
0.0000 mg | ORAL_TABLET | Freq: Two times a day (BID) | ORAL | Status: AC
  Administered 2022-04-25: 1 mg via ORAL
  Administered 2022-04-25 – 2022-04-26 (×2): 2 mg via ORAL
  Filled 2022-04-22: qty 2
  Filled 2022-04-22: qty 1
  Filled 2022-04-22: qty 2

## 2022-04-22 MED ORDER — ESCITALOPRAM OXALATE 20 MG PO TABS
20.0000 mg | ORAL_TABLET | Freq: Every day | ORAL | Status: DC
  Administered 2022-04-23 – 2022-04-26 (×4): 20 mg via ORAL
  Filled 2022-04-22: qty 1
  Filled 2022-04-22: qty 2
  Filled 2022-04-22 (×2): qty 1

## 2022-04-22 MED ORDER — LORAZEPAM 1 MG PO TABS
0.0000 mg | ORAL_TABLET | Freq: Four times a day (QID) | ORAL | Status: AC
  Administered 2022-04-23: 1 mg via ORAL
  Administered 2022-04-23: 2 mg via ORAL
  Filled 2022-04-22 (×2): qty 2

## 2022-04-22 MED ORDER — IOHEXOL 300 MG/ML  SOLN
100.0000 mL | Freq: Once | INTRAMUSCULAR | Status: AC | PRN
  Administered 2022-04-22: 100 mL via INTRAVENOUS

## 2022-04-22 MED ORDER — LORAZEPAM 2 MG/ML IJ SOLN
0.0000 mg | Freq: Four times a day (QID) | INTRAMUSCULAR | Status: AC
  Administered 2022-04-22: 1 mg via INTRAVENOUS
  Administered 2022-04-22 – 2022-04-24 (×4): 2 mg via INTRAVENOUS
  Filled 2022-04-22: qty 1
  Filled 2022-04-22: qty 2
  Filled 2022-04-22 (×3): qty 1
  Filled 2022-04-22: qty 2
  Filled 2022-04-22: qty 1

## 2022-04-22 MED ORDER — SODIUM CHLORIDE 0.9 % IV SOLN: INTRAVENOUS | Status: AC

## 2022-04-22 MED ORDER — ALBUTEROL SULFATE (2.5 MG/3ML) 0.083% IN NEBU: 2.5000 mg | INHALATION_SOLUTION | RESPIRATORY_TRACT | Status: DC | PRN

## 2022-04-22 NOTE — Assessment & Plan Note (Signed)
Initiate CIWA protocol, Would likely benefit from inpatient rehab after discharge

## 2022-04-22 NOTE — ED Provider Notes (Signed)
[facility] DEPT Provider Note   CSN: [id number] Arrival date & time: 04/22/22  1418     History  Chief Complaint  Patient presents with   Suicidal    Jermaine [name] is a 56 y.o. male.  56 yo male. Drinking heavily for a few weeks, last drink was at 1:30pm today. Here with sponsor who has offered to bring him in each day for the past week, today patient accepted help. Recently lost job and "everything." Drinking about a case/day. Reports vomiting and diarrhea today with generalized abdominal pain.   No history of seizures, no cardiac history.   Denies SI, HI although sponsor J[employer]aine cabin [employer]) does not agree with patient's denial of this.        Home Medications Prior to Admission medications   Medication Sig Start Date End Date Taking? Authorizing Provider  Ascorbic Acid (VITAMIN C) 1000 MG tablet Take 2,000 mg by mouth daily. 05/17/21  Yes [provider]  Cholecalciferol (VITAMIN D3) 50 MCG (2000 UT) capsule Take 2,000 Units by mouth daily. 05/17/21  Yes [provider]  escitalopram (LEXAPRO) 20 MG tablet Take 20 mg by mouth daily. 02/27/19  Yes [provider]  Multiple Vitamin (MULTIVITAMIN WITH MINERALS) TABS tablet Take 1 tablet by mouth daily. 02/06/19  Yes Jani Gravel, MD  folic acid (FOLVITE) 1 MG tablet Take 1 tablet (1 mg total) by mouth daily. Patient not taking: Reported on 09/14/2021 03/22/19   Raiford Noble Latif, DO  ondansetron (ZOFRAN) 4 MG tablet Take 1 tablet (4 mg total) by mouth every 6 (six) hours as needed for nausea. Patient not taking: Reported on 09/14/2021 07/05/21   Pokhrel, Corrie Mckusick, MD  potassium chloride SA (KLOR-CON M) 20 MEQ tablet Take 1 tablet (20 mEq total) by mouth daily. Patient not taking: Reported on 09/14/2021 07/05/21   Flora Lipps, MD  thiamine 100 MG tablet Take 1 tablet (100 mg total) by mouth daily. Patient not taking: Reported on 09/14/2021 07/05/21   Flora Lipps, MD  vitamin B-12  (CYANOCOBALAMIN) 1000 MCG tablet Take 2 tablets (2,000 mcg total) by mouth daily. Patient not taking: Reported on 09/14/2021 03/22/19   Kerney Elbe, DO      Allergies    Patient has no known allergies.    Review of Systems   Review of Systems Negative except as per HPI Physical Exam Updated Vital Signs BP 131/89   Pulse (!) 110   Temp 98.4 F (36.9 C) (Oral)   Resp 20   SpO2 93%  Physical Exam Vitals and nursing note reviewed.  Constitutional:      General: He is not in acute distress.    Appearance: He is well-developed. He is not diaphoretic.  HENT:     Head: Normocephalic and atraumatic.     Mouth/Throat:     Mouth: Mucous membranes are moist.  Eyes:     Conjunctiva/sclera: Conjunctivae normal.  Cardiovascular:     Rate and Rhythm: Regular rhythm. Tachycardia present.     Heart sounds: Normal heart sounds.  Pulmonary:     Effort: Pulmonary effort is normal.     Breath sounds: Normal breath sounds.  Abdominal:     Palpations: Abdomen is soft.     Tenderness: There is generalized abdominal tenderness.  Musculoskeletal:     Cervical back: Neck supple.     Right lower leg: No edema.     Left lower leg: No edema.  Skin:    General: Skin is warm and dry.  Findings: No erythema or rash.  Neurological:     Mental Status: He is alert and oriented to person, place, and time.     Motor: Tremor present.  Psychiatric:        Mood and Affect: Mood is anxious.        Behavior: Behavior normal.     ED Results / Procedures / Treatments   Labs (all labs ordered are listed, but only abnormal results are displayed) Labs Reviewed  COMPREHENSIVE METABOLIC PANEL - Abnormal; Notable for the following components:      Result Value   Chloride 96 (*)    Calcium 8.8 (*)    AST 113 (*)    ALT 75 (*)    Anion gap 17 (*)    All other components within normal limits  ETHANOL - Abnormal; Notable for the following components:   Alcohol, Ethyl (B) 366 (*)    All other  components within normal limits  CBC WITH DIFFERENTIAL/PLATELET - Abnormal; Notable for the following components:   RDW 15.6 (*)    All other components within normal limits  SALICYLATE LEVEL - Abnormal; Notable for the following components:   Salicylate Lvl [id number] (*)    All other components within normal limits  ACETAMINOPHEN LEVEL - Abnormal; Notable for the following components:   Acetaminophen (Tylenol), Serum <10 (*)    All other components within normal limits  RESP PANEL BY RT-PCR (FLU A&B, COVID) ARPGX2  MAGNESIUM  RAPID URINE DRUG SCREEN, HOSP PERFORMED  CK  PHOSPHORUS  PREALBUMIN  PROTIME-INR  TSH  URINALYSIS, COMPLETE (UACMP) WITH MICROSCOPIC    EKG EKG Interpretation  Date/Time:  Sunday April 22 2022 18:58:17 EST Ventricular Rate:  119 PR Interval:  123 QRS Duration: 95 QT Interval:  340 QTC Calculation: 479 R Axis:   108 Text Interpretation: Sinus tachycardia Right axis deviation Borderline prolonged QT interval No acute changes No significant change since last tracing Confirmed by Varney Biles [id number]) on 04/22/2022 8:26:51 PM  Radiology CT Abdomen Pelvis W Contrast  Result Date: 04/22/2022 CLINICAL DATA:  Abdominal pain. EXAM: CT ABDOMEN AND PELVIS WITH CONTRAST TECHNIQUE: Multidetector CT imaging of the abdomen and pelvis was performed using the standard protocol following bolus administration of intravenous contrast. RADIATION DOSE REDUCTION: This exam was performed according to the departmental dose-optimization program which includes automated exposure control, adjustment of the mA and/or kV according to patient size and/or use of iterative reconstruction technique. CONTRAST:  167mL OMNIPAQUE IOHEXOL 300 MG/ML  SOLN COMPARISON:  None Available. FINDINGS: Lower chest: No acute abnormality. Hepatobiliary: There is diffuse fatty infiltration of the liver parenchyma. A 7 mm focus of parenchymal low attenuation is seen within the right of the liver (axial CT  image 14, CT series 2). The gallbladder is moderately distended. No gallstones, gallbladder wall thickening, or biliary dilatation. Pancreas: Unremarkable. No pancreatic ductal dilatation or surrounding inflammatory changes. Spleen: Normal in size without focal abnormality. Adrenals/Urinary Tract: Adrenal glands are unremarkable. Kidneys are normal, without renal calculi, focal lesion, or hydronephrosis. The urinary bladder is moderately distended and is otherwise unremarkable. Stomach/Bowel: Stomach is within normal limits. Appendix appears normal. No evidence of bowel wall thickening, distention, or inflammatory changes. Numerous diverticula are seen throughout the transverse, descending and sigmoid colon. Mild thickening of the proximal sigmoid colon is also seen. Vascular/Lymphatic: Aortic atherosclerosis. No enlarged abdominal or pelvic lymph nodes. Reproductive: The prostate gland is mildly enlarged. Other: A 2.1 cm x 1.7 cm fat containing scrotal hernia is seen on the  right. No abdominopelvic ascites. Musculoskeletal: No acute or significant osseous findings. IMPRESSION: 1. Colonic diverticulosis with mild thickening of the proximal sigmoid colon which may represent mild diverticulitis. 2. Hepatic steatosis. 3. Findings which may represent a small hepatic cyst versus hemangioma. Correlation with nonemergent hepatic ultrasound is recommended. 4. Aortic atherosclerosis. Aortic Atherosclerosis (ICD10-I70.0). Electronically Signed   By: Virgina Norfolk M.D.   On: 04/22/2022 19:47   DG Chest 2 View  Result Date: 04/22/2022 CLINICAL DATA:  Suicidal EXAM: CHEST - 2 VIEW COMPARISON:  09/22/2021 FINDINGS: The heart size and mediastinal contours are within normal limits. Both lungs are clear. The visualized skeletal structures are unremarkable. IMPRESSION: No acute abnormality of the lungs. Electronically Signed   By: Delanna Ahmadi M.D.   On: 04/22/2022 15:57    Procedures .Critical Care  Performed by:  Tacy Learn, PA-C Authorized by: Tacy Learn, PA-C   Critical care provider statement:    Critical care time (minutes):  30   Critical care was time spent personally by me on the following activities:  Development of treatment plan with patient or surrogate, discussions with consultants, evaluation of patient's response to treatment, examination of patient, ordering and review of laboratory studies, ordering and review of radiographic studies, ordering and performing treatments and interventions, pulse oximetry, re-evaluation of patient's condition and review of old charts     Medications Ordered in ED Medications  LORazepam (ATIVAN) injection 0-4 mg (2 mg Intravenous Given 04/22/22 1853)    Or  LORazepam (ATIVAN) tablet 0-4 mg ( Oral See Alternative 04/22/22 1853)  LORazepam (ATIVAN) injection 0-4 mg (has no administration in time range)    Or  LORazepam (ATIVAN) tablet 0-4 mg (has no administration in time range)  thiamine (VITAMIN B1) tablet 100 mg ( Oral See Alternative 04/22/22 1854)    Or  thiamine (VITAMIN B1) injection 100 mg (100 mg Intravenous Given 04/22/22 1854)  cefTRIAXone (ROCEPHIN) 2 g in sodium chloride 0.9 % 100 mL IVPB (has no administration in time range)    And  metroNIDAZOLE (FLAGYL) IVPB 500 mg (has no administration in time range)  LORazepam (ATIVAN) tablet 2 mg (2 mg Oral Given 04/22/22 1854)  sodium chloride 0.9 % bolus 1,000 mL (1,000 mLs Intravenous New Bag/Given 04/22/22 1857)  iohexol (OMNIPAQUE) 300 MG/ML solution 100 mL (100 mLs Intravenous Contrast Given 04/22/22 1925)    ED Course/ Medical Decision Making/ A&P                           Medical Decision Making Amount and/or Complexity of Data Reviewed Labs: ordered. Radiology: ordered.  Risk OTC drugs. Prescription drug management. Decision regarding hospitalization.   This patient presents to the ED for concern of alcohol abuse with abdominal pain, nausea, vomiting, diarrhea as well  as suicidal ideation, this involves an extensive number of treatment options, and is a complaint that carries with it a high risk of complications and morbidity.  The differential diagnosis includes but not limited to withdrawal, colitis, diverticulitis, SBO, psychosis    Co morbidities that complicate the patient evaluation  Alcohol abuse, cirrhosis   Additional history obtained:  Additional history obtained from patient's sponsor who is at bedside, reports to attending MD patient with attempting to put a rope around his neck to harm himself External records from outside source obtained and reviewed including discharge summary from admission 4/20-23/2023 for alcohol withdrawal    Lab Tests:  I Ordered, and personally interpreted labs.  The pertinent results include:  etoh significantly elevated at 366, CBC with normal WBC, CMP with elevated LFTs (113/75)   Imaging Studies ordered:  I ordered imaging studies including CXR, CT abd/pelvis   I independently visualized and interpreted imaging which showed chest x-ray without acute abnormality.  CT abdomen pelvis with possible diverticulitis see additional full report. I agree with the radiologist interpretation   Cardiac Monitoring: / EKG:  The patient was maintained on a cardiac monitor.  I personally viewed and interpreted the cardiac monitored which showed an underlying rhythm of: sinus tach, rate 119   Consultations Obtained:  I requested consultation with the hospitalist Dr. Adela Glimpse,  and discussed lab and imaging findings as well as pertinent plan - they recommend: admission    Problem List / ED Course / Critical interventions / Medication management  56 year old male brought in by his sponsor with concern for suicidal ideation, has been wrapping a rope around his neck.  Admits to being very intoxicated, heavily drinking for the past few weeks, here with abdominal pain, nausea, vomiting diarrhea.  He is found have generalized  abdominal tenderness.  Patient is provided with Ativan via CIWA protocol.  Medical work-up completed revealing mild diverticulitis, afebrile, normal white count.  Patient CIWA remains elevated, 15 on last check, has had 4 mg of Ativan and now requires supplemental O2 while sleeping.  Patient rouses easily to verbal stimuli.  Due to concern for complicated detox, will request for medical admission. Discussed with Dr. Rhunette Croft, ER attending who has seen the patient and [facility] patient, agrees with plan of care.  I ordered medication including ativan, rocephin, flagyl  for alcohol withdrawal, diverticulitis   Reevaluation of the patient after these medicines showed that the patient  O2 sats dropped while sleeing requiring supplemental O2. BP and HR improved with Ativan.  I have reviewed the patients home medicines and have made adjustments as needed   Social Determinants of Health:  Recently lost job, here with sponsor    Test / Admission - Considered:  Admit for medical detox, management of mild diverticulitis, transition to inpatient psych.          Final Clinical Impression(s) / ED Diagnoses Final diagnoses:  Diverticulitis  Alcohol intoxication in active alcoholic with complication (HCC)  Suicidal ideation  Hypoxemia    Rx / DC Orders ED Discharge Orders     None         Alden Hipp 04/22/22 2103    Derwood Kaplan, MD 04/22/22 2124

## 2022-04-22 NOTE — Assessment & Plan Note (Signed)
Continue Rocephin and Flagyl IV Bowel rest for tonight

## 2022-04-22 NOTE — Assessment & Plan Note (Signed)
Order CIWA protocol °

## 2022-04-22 NOTE — Consult Note (Signed)
[facility] ED ASSESSMENT   Reason for Consult:  Psychiatry evaluation Referring Physician:  ER Physician Patient Identification: Jermaine [name] MRN:  [id number] ED Chief Complaint: Acute alcohol intoxication with alcoholism (HCC)  Diagnosis:  Principal Problem:   Acute alcohol intoxication with alcoholism (HCC) Active Problems:   Suicide ideation   ED Assessment Time Calculation: Start Time: 1707 Stop Time: 1729 Total Time in Minutes (Assessment Completion): 22   Subjective:   Jermaine [name] is a 56 y.o. male patient admitted with previous hx of Alcoholism, depression, anxiety brought to the ER by his sponsor after he became intoxicated and had a rope around his neck.. Patient reports an increase in alcohol intake that led to losing his job.  He has become depressed and today he had rope around his neck and called his sponsor.  HPI:  Patient was taken to the conference room for evaluation.  Initially he did not want to participate in the interview but wanted to leave and go drink more alcohol.  He sat down when his sponsor urged him to participate.  He denied feeling suicidal but admitted that before his sponsor came to the house he had applied a rope around his neck.  At the time he wanted to do something but stopped.  Patient reported that he has been drinking daily for 10 days. He drinks about a case a day of beers. He said he was drinking "white claws"  for 10 days.  He denied alcohol withdrawal seizure this time but reported that in the past he had something that appeared to seizure while withdrawing from Alcohol.  Today he is experiencing shakes and tremors .  Patient receives Lexapro for depression and anxiety from his PCP.  He does not have outpatient Psychiatrist.  Patient reports poor sleep and poor appetite. We have started detox treatment using CIWA protocol and Ativan.  We will seek inpatient Detox treatment as well as manage depression and anxiety.   Past Psychiatric History: Depression,  anxiety, Alcoholism/Intoxication  Risk to Self or Others: Is the patient at risk to self? No Has the patient been a risk to self in the past 6 months? No Has the patient been a risk to self within the distant past? No Is the patient a risk to others? No Has the patient been a risk to others in the past 6 months? No Has the patient been a risk to others within the distant past? No  [place] Scale:  Flowsheet Row ED from 04/22/2022 in [facility] Most recent reading at 04/22/2022  3:24 PM ED from 09/21/2021 in [facility] Most recent reading at 09/21/2021  4:16 PM ED from 09/21/2021 in [facility] Most recent reading at 09/21/2021  6:19 AM  C-SSRS RISK CATEGORY No Risk No Risk No Risk       AIMS:  , , ,  ,   ASAM:    Substance Abuse:     Past Medical History:  Past Medical History:  Diagnosis Date   Alcohol abuse    Anxiety    Class 1 obesity 09/14/2021   Depression     Past Surgical History:  Procedure Laterality Date   HERNIA REPAIR     Family History:  Family History  Adopted: Yes   Family Psychiatric  History: Unknown, adopted Social History:  Social History   Substance and Sexual Activity  Alcohol Use Yes   Alcohol/week: 20.0 standard drinks of alcohol   Types: 20 Standard  drinks or equivalent per week     Social History   Substance and Sexual Activity  Drug Use Not Currently    Social History   Socioeconomic History   Marital status: Single    Spouse name: Not on file   Number of children: Not on file   Years of education: Not on file   Highest education level: Not on file  Occupational History   Not on file  Tobacco Use   Smoking status: Never   Smokeless tobacco: Never  Vaping Use   Vaping Use: Never used  Substance and Sexual Activity   Alcohol use: Yes    Alcohol/week: 20.0 standard drinks of alcohol    Types: 20 Standard drinks or  equivalent per week   Drug use: Not Currently   Sexual activity: Not on file  Other Topics Concern   Not on file  Social History Narrative   Not on file   Social Determinants of Health   Financial Resource Strain: Not on file  Food Insecurity: Not on file  Transportation Needs: Not on file  Physical Activity: Not on file  Stress: Not on file  Social Connections: Not on file   Additional Social History:    Allergies:  No Known Allergies  Labs:  Results for orders placed or performed during the hospital encounter of 04/22/22 (from the past 48 hour(s))  Comprehensive metabolic panel     Status: Abnormal   Collection Time: 04/22/22  3:27 PM  Result Value Ref Range   Sodium 138 135 - 145 mmol/L   Potassium 3.9 3.5 - 5.1 mmol/L   Chloride 96 (L) 98 - 111 mmol/L   CO2 25 22 - 32 mmol/L   Glucose, Bld 99 70 - 99 mg/dL    Comment: Glucose reference range applies only to samples taken after fasting for at least 8 hours.   BUN 16 6 - 20 mg/dL   Creatinine, Ser 5.73 0.61 - 1.24 mg/dL   Calcium 8.8 (L) 8.9 - 10.3 mg/dL   Total Protein 8.1 6.5 - 8.1 g/dL   Albumin 5.0 3.5 - 5.0 g/dL   AST 220 (H) 15 - 41 U/L   ALT 75 (H) 0 - 44 U/L   Alkaline Phosphatase 49 38 - 126 U/L   Total Bilirubin 1.2 0.3 - 1.2 mg/dL   GFR, Estimated >25 >42 mL/min    Comment: (NOTE) Calculated using the CKD-EPI Creatinine Equation (2021)    Anion gap 17 (H) 5 - 15    Comment: Performed at [facility], [address] [place], [place] [address]  CBC with Diff     Status: Abnormal   Collection Time: 04/22/22  3:27 PM  Result Value Ref Range   WBC 6.5 4.0 - 10.5 K/uL   RBC 5.61 4.22 - 5.81 MIL/uL   Hemoglobin 16.9 13.0 - 17.0 g/dL   HCT 76.2 83.1 - 51.7 %   MCV 89.5 80.0 - 100.0 fL   MCH 30.1 26.0 - 34.0 pg   MCHC 33.7 30.0 - 36.0 g/dL   RDW 61.6 (H) 07.3 - 71.0 %   Platelets 155 150 - 400 K/uL   nRBC 0.0 0.0 - 0.2 %   Neutrophils Relative % 63 %   Neutro Abs 4.2 1.7 - 7.7 K/uL    Lymphocytes Relative 28 %   Lymphs Abs 1.8 0.7 - 4.0 K/uL   Monocytes Relative 7 %   Monocytes Absolute 0.5 0.1 - 1.0 K/uL   Eosinophils Relative  1 %   Eosinophils Absolute 0.0 0.0 - 0.5 K/uL   Basophils Relative 1 %   Basophils Absolute 0.0 0.0 - 0.1 K/uL   Immature Granulocytes 0 %   Abs Immature Granulocytes 0.01 0.00 - 0.07 K/uL    Comment: Performed at [facility], [address] [place], [place] [id number]    Current Facility-Administered Medications  Medication Dose Route Frequency Provider Last Rate Last Admin   LORazepam (ATIVAN) injection 0-4 mg  0-4 mg Intravenous Q6H Army MeliaMurphy, Laura A, PA-C       Or   LORazepam (ATIVAN) tablet 0-4 mg  0-4 mg Oral Q6H Jeannie FendMurphy, Laura A, PA-C       [START ON 04/24/2022] LORazepam (ATIVAN) injection 0-4 mg  0-4 mg Intravenous Q12H Jeannie FendMurphy, Laura A, PA-C       Or   Melene Muller[START ON 04/24/2022] LORazepam (ATIVAN) tablet 0-4 mg  0-4 mg Oral Q12H Army MeliaMurphy, Laura A, PA-C       LORazepam (ATIVAN) tablet 2 mg  2 mg Oral Once Rhunette CroftNanavati, Ankit, MD       sodium chloride 0.9 % bolus 1,000 mL  1,000 mL Intravenous Once Army MeliaMurphy, Laura A, PA-C       thiamine (VITAMIN B1) tablet 100 mg  100 mg Oral Daily Army MeliaMurphy, Laura A, PA-C       Or   thiamine (VITAMIN B1) injection 100 mg  100 mg Intravenous Daily Jeannie FendMurphy, Laura A, PA-C       Current Outpatient Medications  Medication Sig Dispense Refill   Ascorbic Acid (VITAMIN C) 1000 MG tablet Take 2,000 mg by mouth daily.     Cholecalciferol (VITAMIN D3) 50 MCG (2000 UT) capsule Take 2,000 Units by mouth daily.     escitalopram (LEXAPRO) 20 MG tablet Take 20 mg by mouth daily.     folic acid (FOLVITE) 1 MG tablet Take 1 tablet (1 mg total) by mouth daily. (Patient not taking: Reported on 09/14/2021) 30 tablet 0   Multiple Vitamin (MULTIVITAMIN WITH MINERALS) TABS tablet Take 1 tablet by mouth daily. 30 tablet 0   ondansetron (ZOFRAN) 4 MG tablet Take 1 tablet (4 mg total) by mouth every 6 (six) hours as needed  for nausea. (Patient not taking: Reported on 09/14/2021) 20 tablet 0   potassium chloride SA (KLOR-CON M) 20 MEQ tablet Take 1 tablet (20 mEq total) by mouth daily. (Patient not taking: Reported on 09/14/2021) 10 tablet 0   testosterone cypionate (DEPOTESTOTERONE CYPIONATE) 100 MG/ML injection Inject 100 mg into the muscle once a week. For IM use only     thiamine 100 MG tablet Take 1 tablet (100 mg total) by mouth daily. (Patient not taking: Reported on 09/14/2021) 30 tablet 0   vitamin B-12 (CYANOCOBALAMIN) 1000 MCG tablet Take 2 tablets (2,000 mcg total) by mouth daily. (Patient not taking: Reported on 09/14/2021) 60 tablet 0    Musculoskeletal: Strength & Muscle Tone: within normal limits Gait & Station: normal Patient leans: Front   Psychiatric Specialty Exam: Presentation  General Appearance:  Casual; Disheveled; Other (comment) (obese)  Eye Contact: Good  Speech: Clear and Coherent; Normal Rate  Speech Volume: Normal  Handedness: Right   Mood and Affect  Mood: Anxious; Angry; Irritable  Affect: Congruent   Thought Process  Thought Processes: Coherent; Goal Directed  Descriptions of Associations:Intact  Orientation:Full (Time, Place and Person)  Thought Content:Logical  History of Schizophrenia/Schizoaffective disorder:No data recorded Duration of Psychotic Symptoms:No data recorded Hallucinations:Hallucinations: Visual Description of Visual Hallucinations: Sees shadows  and thgings on the wall when withdrawing from alcohol.  Sees black/white spots as well.  Ideas of Reference:None  Suicidal Thoughts:Suicidal Thoughts: No  Homicidal Thoughts:Homicidal Thoughts: No   Sensorium  Memory: Immediate Good; Recent Good; Remote Good  Judgment: Impaired  Insight: Fair   Art therapist  Concentration: Poor  Attention Span: Poor  Recall: Fair  Fund of Knowledge: Good  Language: Good   Psychomotor Activity  Psychomotor  Activity: Psychomotor Activity: Tremor   Assets  Assets: Communication Skills; Desire for Improvement; Housing; Physical Health    Sleep  Sleep: Sleep: Fair   Physical Exam: Physical Exam Vitals and nursing note reviewed.  Constitutional:      Appearance: Normal appearance. He is obese.  HENT:     Head: Normocephalic and atraumatic.     Nose: Nose normal.  Cardiovascular:     Rate and Rhythm: Tachycardia present.  Pulmonary:     Effort: Pulmonary effort is normal.  Musculoskeletal:        General: Normal range of motion.     Cervical back: Normal range of motion.  Skin:    General: Skin is warm and dry.  Neurological:     Mental Status: He is alert and oriented to person, place, and time.    Review of Systems  Constitutional: Negative.   HENT: Negative.    Eyes: Negative.   Respiratory: Negative.    Cardiovascular: Negative.   Gastrointestinal: Negative.   Genitourinary: Negative.   Musculoskeletal: Negative.   Skin: Negative.   Neurological:  Positive for tremors.  Endo/Heme/Allergies: Negative.   Psychiatric/Behavioral:  Positive for depression and hallucinations. The patient is nervous/anxious.    Blood pressure (!) 147/120, pulse (!) 123, temperature 98.3 F (36.8 C), temperature source Oral, resp. rate 18, SpO2 93 %. There is no height or weight on file to calculate BMI.  Medical Decision Making: Patient meets criteria for inpatient Alcohol Detox treatment.  He is experiencing shakes and tremors with elevated V/S.  We will be using Ciwa protocol with Ativan coverage.  We will seek bed placement at any facility with available beds.  He denies feeling suicidal but his sponsor reported that he had told him that he had a rope around his neck shortly before he came.   Patient is a danger to self at this time having lost his job due to alcohol use.  Problem 1: Acute Alcohol intoxication with Alcoholism  Problem 2: Suicide ideation.  Disposition:  Admit,  seek placement.  Earney Navy, NP-PMHNP-BC 04/22/2022 5:29 PM

## 2022-04-22 NOTE — Assessment & Plan Note (Signed)
Order behavioral health consult suicide precautions

## 2022-04-22 NOTE — ED Triage Notes (Signed)
Pt reports "drinking so many fucking white claws the last 10 days." Per family with patient the patient expressed a plan of how he is going to kill himself. Patient is denying any SI/HI at this time.

## 2022-04-22 NOTE — Assessment & Plan Note (Signed)
Will rehydrate Follow CK

## 2022-04-22 NOTE — Assessment & Plan Note (Signed)
Noted in the setting of sedation after use of Ativan.   continuous pulse oxygen  , provide oxygen as needed chest x-ray unremarkable  No chest pain would probably benefit from outpatient sleep apnea study Obesity may be contributing to positional hypoxia hypoventilation

## 2022-04-22 NOTE — Subjective & Objective (Signed)
Was brought in to ER by his sponsor who found him intoxicated with a rope around his neck. Patient states increased alcohol intake caused him to lose his job he became depressed and suicidal today. Patient transiently denied being suicidal but did state that he did put a rope on his neck.  He has been drinking daily for the past 10 days case at beers a day past history of alcohol withdrawal decreased appetite and poor sleep.  Patient at this time patient agreeable for admission for alcohol withdrawal and CIWA protocol

## 2022-04-22 NOTE — H&P (Addendum)
Jermaine [name]:[id number] DOB: February 23, 1966 DOA: 04/22/2022   PCP: Farris Has, MD      Patient arrived to ER on 04/22/22 at 1418 Referred by Attending Derwood Kaplan, MD   Patient coming from:    home Lives alone,        Chief Complaint:   Chief Complaint  Patient presents with   Suicidal    HPI: Jermaine [name] is a 56 y.o. male with medical history significant of alcohol abuse    Presented with  alcohol intoxication and possible suicide attempt Was brought in to ER by his sponsor who found him intoxicated with a rope around his neck. Patient states increased alcohol intake caused him to lose his job he became depressed and suicidal today. Patient transiently denied being suicidal but did state that he did put a rope on his neck.  He has been drinking daily for the past 10 days case at beers a day past history of alcohol withdrawal decreased appetite and poor sleep.  Patient at this time patient agreeable for admission for alcohol withdrawal and CIWA protocol     Reported AB pain and diarrhea  While in ER:  Started on CIWA protocol when he falls asleep he is hypoxic   CT abd   CXR -  NON acute  CTabd/pelvis -  Colonic diverticulosis with mild thickening of the proximal sigmoid colon which may represent mild diverticulitis. 2. Hepatic steatosis. 3. Findings which may represent a small hepatic cyst versus hemangioma. Correlation with nonemergent hepatic ultrasound is recommended. 4. Aortic atherosclerosis    Following Medications were ordered in ER: Medications  LORazepam (ATIVAN) injection 0-4 mg (2 mg Intravenous Given 04/22/22 1853)    Or  LORazepam (ATIVAN) tablet 0-4 mg ( Oral See Alternative 04/22/22 1853)  LORazepam (ATIVAN) injection 0-4 mg (has no administration in time range)    Or  LORazepam (ATIVAN) tablet 0-4 mg (has no administration in time range)  thiamine (VITAMIN B1) tablet 100 mg ( Oral See Alternative 04/22/22 1854)    Or  thiamine  (VITAMIN B1) injection 100 mg (100 mg Intravenous Given 04/22/22 1854)  cefTRIAXone (ROCEPHIN) 2 g in sodium chloride 0.9 % 100 mL IVPB (has no administration in time range)    And  metroNIDAZOLE (FLAGYL) IVPB 500 mg (has no administration in time range)  LORazepam (ATIVAN) tablet 2 mg (2 mg Oral Given 04/22/22 1854)  sodium chloride 0.9 % bolus 1,000 mL (1,000 mLs Intravenous New Bag/Given 04/22/22 1857)  iohexol (OMNIPAQUE) 300 MG/ML solution 100 mL (100 mLs Intravenous Contrast Given 04/22/22 1925)       ED Triage Vitals  Enc Vitals Group     BP 04/22/22 1501 (!) 147/120     Pulse Rate 04/22/22 1501 (!) 123     Resp 04/22/22 1501 18     Temp 04/22/22 1501 98.3 F (36.8 C)     Temp Source 04/22/22 1501 Oral     SpO2 04/22/22 1501 93 %     Weight --      Height --      Head Circumference --      Peak Flow --      Pain Score 04/22/22 1524 8     Pain Loc --      Pain Edu? --      Excl. in GC? --   TMAX(24)@     _________________________________________ Significant initial  Findings: Abnormal Labs Reviewed  COMPREHENSIVE METABOLIC PANEL - Abnormal; Notable for the following components:  Result Value   Chloride 96 (*)    Calcium 8.8 (*)    AST 113 (*)    ALT 75 (*)    Anion gap 17 (*)    All other components within normal limits  ETHANOL - Abnormal; Notable for the following components:   Alcohol, Ethyl (B) 366 (*)    All other components within normal limits  CBC WITH DIFFERENTIAL/PLATELET - Abnormal; Notable for the following components:   RDW 15.6 (*)    All other components within normal limits  SALICYLATE LEVEL - Abnormal; Notable for the following components:   Salicylate Lvl <7.0 (*)    All other components within normal limits  ACETAMINOPHEN LEVEL - Abnormal; Notable for the following components:   Acetaminophen (Tylenol), Serum <10 (*)    All other components within normal limits      ECG: Ordered Personally reviewed and interpreted by me showing: HR :  110 Rhythm:   Sinus tachycardia  Right axis deviation Borderline prolonged QT interval No acute changes No significant change since last tracing QTC 479    The recent clinical data is shown below. Vitals:   04/22/22 1900 04/22/22 1945 04/22/22 1949 04/22/22 2030  BP:  (!) 182/144 (!) 182/144 131/89  Pulse:  (!) 120 (!) 114 (!) 110  Resp:    20  Temp: 98.4 F (36.9 C)     TempSrc: Oral     SpO2: 92% 91%  93%     WBC     Component Value Date/Time   WBC 6.5 04/22/2022 1527   LYMPHSABS 1.8 04/22/2022 1527   MONOABS 0.5 04/22/2022 1527   EOSABS 0.0 04/22/2022 1527   BASOSABS 0.0 04/22/2022 1527    Lactic Acid, Venous No results found for: "LATICACIDVEN"     UA   ordered   Urine analysis:    Component Value Date/Time   COLORURINE YELLOW (A) 07/01/2021 1337   APPEARANCEUR CLEAR (A) 07/01/2021 1337   LABSPEC 1.010 07/01/2021 1337   PHURINE 6.0 07/01/2021 1337   GLUCOSEU NEGATIVE 07/01/2021 1337   HGBUR NEGATIVE 07/01/2021 1337   BILIRUBINUR NEGATIVE 07/01/2021 1337   KETONESUR 80 (A) 07/01/2021 1337   PROTEINUR NEGATIVE 07/01/2021 1337   NITRITE NEGATIVE 07/01/2021 1337   LEUKOCYTESUR NEGATIVE 07/01/2021 1337     _______________________________________________ Hospitalist was called for admission for   Diverticulitis  Alcohol intoxication in active alcoholic with complication (HCC)     Suicidal ideation    The following Work up has been ordered so far:  Orders Placed This Encounter  Procedures   Resp Panel by RT-PCR (Flu A&B, Covid) Anterior Nasal Swab   DG Chest 2 View   CT Abdomen Pelvis W Contrast   Comprehensive metabolic panel   Ethanol   Urine rapid drug screen (hosp performed)   CBC with Diff   Salicylate level   Acetaminophen level   Magnesium   Initiate Carrier Fluid Protocol   Cardiac monitoring   Search Patient Belongings   Clinical institute withdrawal assessment every 6 hours X 48 hours, then every 12 hours x 48 hours   Notify MD if  CIWA-AR is greater than 10 for 2 consecutive assessments.   Notify MD if CIWA-AR score > 10 point increase (consider critical care consult).   Notify MD if patient has a seizure.   May administer Ativan PO versus IV if patient is tolerating PO intake well   Vital signs every 6 hours X 48 hours, then per unit protocol   Refer to  Sidebar Report CIWA protocol   If Ativan given, reassess Clinical Institute Withdrawal Assessment (CIWA) q 1 hour   Consult to TTS   Consult to hospitalist   ED EKG   EKG 12-Lead     OTHER Significant initial  Findings:  labs showing:    Recent Labs  Lab 04/22/22 1527 04/22/22 1539  NA 138  --   K 3.9  --   CO2 25  --   GLUCOSE 99  --   BUN 16  --   CREATININE 1.01  --   CALCIUM 8.8*  --   MG  --  2.2    Cr    stable,  Lab Results  Component Value Date   CREATININE 1.01 04/22/2022   CREATININE 1.12 09/21/2021   CREATININE 1.10 09/20/2021    Recent Labs  Lab 04/22/22 1527  AST 113*  ALT 75*  ALKPHOS 49  BILITOT 1.2  PROT 8.1  ALBUMIN 5.0   Lab Results  Component Value Date   CALCIUM 8.8 (L) 04/22/2022   PHOS 3.3 09/16/2021        Plt: Lab Results  Component Value Date   PLT 155 04/22/2022           Recent Labs  Lab 04/22/22 1527  WBC 6.5  NEUTROABS 4.2  HGB 16.9  HCT 50.2  MCV 89.5  PLT 155    HG/HCT   stable,       Component Value Date/Time   HGB 16.9 04/22/2022 1527   HCT 50.2 04/22/2022 1527   MCV 89.5 04/22/2022 1527    Radiological Exams on Admission: CT Abdomen Pelvis W Contrast  Result Date: 04/22/2022 CLINICAL DATA:  Abdominal pain. EXAM: CT ABDOMEN AND PELVIS WITH CONTRAST TECHNIQUE: Multidetector CT imaging of the abdomen and pelvis was performed using the standard protocol following bolus administration of intravenous contrast. RADIATION DOSE REDUCTION: This exam was performed according to the departmental dose-optimization program which includes automated exposure control, adjustment of the mA  and/or kV according to patient size and/or use of iterative reconstruction technique. CONTRAST:  OMNIPAQUE IOHEXOL 300 MG/ML  SOLN COMPARISON:  None Available. FINDINGS: Lower chest: No acute abnormality. Hepatobiliary: There is diffuse fatty infiltration of the liver parenchyma. A 7 mm focus of parenchymal low attenuation is seen within the right of the liver (axial CT image 14, CT series 2). The gallbladder is moderately distended. No gallstones, gallbladder wall thickening, or biliary dilatation. Pancreas: Unremarkable. No pancreatic ductal dilatation or surrounding inflammatory changes. Spleen: Normal in size without focal abnormality. Adrenals/Urinary Tract: Adrenal glands are unremarkable. Kidneys are normal, without renal calculi, focal lesion, or hydronephrosis. The urinary bladder is moderately distended and is otherwise unremarkable. Stomach/Bowel: Stomach is within normal limits. Appendix appears normal. No evidence of bowel wall thickening, distention, or inflammatory changes. Numerous diverticula are seen throughout the transverse, descending and sigmoid colon. Mild thickening of the proximal sigmoid colon is also seen. Vascular/Lymphatic: Aortic atherosclerosis. No enlarged abdominal or pelvic lymph nodes. Reproductive: The prostate gland is mildly enlarged. Other: A 2.1 cm x 1.7 cm fat containing scrotal hernia is seen on the right. No abdominopelvic ascites. Musculoskeletal: No acute or significant osseous findings. IMPRESSION: 1. Colonic diverticulosis with mild thickening of the proximal sigmoid colon which may represent mild diverticulitis. 2. Hepatic steatosis. 3. Findings which may represent a small hepatic cyst versus hemangioma. Correlation with nonemergent hepatic ultrasound is recommended. 4. Aortic atherosclerosis. Aortic Atherosclerosis (ICD10-I70.0). Electronically Signed   By: Demetrius Revel.D.  On: 04/22/2022 19:47   DG Chest 2 View  Result Date: 04/22/2022 CLINICAL  DATA:  Suicidal EXAM: CHEST - 2 VIEW COMPARISON:  09/22/2021 FINDINGS: The heart size and mediastinal contours are within normal limits. Both lungs are clear. The visualized skeletal structures are unremarkable. IMPRESSION: No acute abnormality of the lungs. Electronically Signed   By: Jearld Lesch M.D.   On: 04/22/2022 15:57   _______________________________________________________________________________________________________ Latest  Blood pressure 131/89, pulse (!) 110, temperature 98.4 F (36.9 C), temperature source Oral, resp. rate 20, SpO2 93 %.   Vitals  labs and radiology finding personally reviewed  Review of Systems:    Pertinent positives include:  , fatigue, change in mood or affect.   depression or anxiety  Constitutional:  No weight loss, night sweats, Fevers, chills weight loss  HEENT:  No headaches, Difficulty swallowing,Tooth/dental problems,Sore throat,  No sneezing, itching, ear ache, nasal congestion, post nasal drip,  Cardio-vascular:  No chest pain, Orthopnea, PND, anasarca, dizziness, palpitations.no Bilateral lower extremity swelling  GI:  No heartburn, indigestion, abdominal pain, nausea, vomiting, diarrhea, change in bowel habits, loss of appetite, melena, blood in stool, hematemesis Resp:  no shortness of breath at rest. No dyspnea on exertion, No excess mucus, no productive cough, No non-productive cough, No coughing up of blood.No change in color of mucus.No wheezing. Skin:  no rash or lesions. No jaundice GU:  no dysuria, change in color of urine, no urgency or frequency. No straining to urinate.  No flank pain.  Musculoskeletal:  No joint pain or no joint swelling. No decreased range of motion. No back pain.  Psych:  No . No memory loss.  Neuro: no localizing neurological complaints, no tingling, no weakness, no double vision, no gait abnormality, no slurred speech, no confusion  All systems reviewed and apart from HOPI all are  negative _______________________________________________________________________________________________ Past Medical History:   Past Medical History:  Diagnosis Date   Alcohol abuse    Anxiety    Class 1 obesity 09/14/2021   Depression     Past Surgical History:  Procedure Laterality Date   HERNIA REPAIR      Social History:  Ambulatory   independently      reports that he has never smoked. He has never used smokeless tobacco. He reports current alcohol use of about 20.0 standard drinks of alcohol per week. He reports that he does not currently use drugs.     Family History:   Family History  Adopted: Yes   ______________________________________________________________________________________________ Allergies: No Known Allergies   Prior to Admission medications   Medication Sig Start Date End Date Taking? Authorizing Provider  Ascorbic Acid (VITAMIN C) 1000 MG tablet Take 2,000 mg by mouth daily. 05/17/21  Yes [provider]  Cholecalciferol (VITAMIN D3) 50 MCG (2000 UT) capsule Take 2,000 Units by mouth daily. 05/17/21  Yes [provider]  escitalopram (LEXAPRO) 20 MG tablet Take 20 mg by mouth daily. 02/27/19  Yes [provider]  Multiple Vitamin (MULTIVITAMIN WITH MINERALS) TABS tablet Take 1 tablet by mouth daily. 02/06/19  Yes Pearson Grippe, MD  folic acid (FOLVITE) 1 MG tablet Take 1 tablet (1 mg total) by mouth daily. Patient not taking: Reported on 09/14/2021 03/22/19   Marguerita Merles Latif, DO  ondansetron (ZOFRAN) 4 MG tablet Take 1 tablet (4 mg total) by mouth every 6 (six) hours as needed for nausea. Patient not taking: Reported on 09/14/2021 07/05/21   Joycelyn Das, MD  potassium chloride SA (KLOR-CON M) 20 MEQ  tablet Take 1 tablet (20 mEq total) by mouth daily. Patient not taking: Reported on 09/14/2021 07/05/21   Joycelyn Das, MD  thiamine 100 MG tablet Take 1 tablet (100 mg total) by mouth daily. Patient not taking: Reported on  09/14/2021 07/05/21   Joycelyn Das, MD  vitamin B-12 (CYANOCOBALAMIN) 1000 MCG tablet Take 2 tablets (2,000 mcg total) by mouth daily. Patient not taking: Reported on 09/14/2021 03/22/19   Merlene Laughter, DO    ___________________________________________________________________________________________________ Physical Exam:    04/22/2022    8:30 PM 04/22/2022    7:49 PM 04/22/2022    7:45 PM  Vitals with BMI  Systolic 131 182 161  Diastolic 89 144 144  Pulse 110 114 120     1. General:  in No  Acute distress     Chronically ill  -appearing 2. Psychological: Oriented to self somewhat somnolent 3. Head/ENT:   Dry Mucous Membranes                          Head Non traumatic, neck supple                           Poor Dentition 4. SKIN:  decreased Skin turgor,  Skin clean Dry and intact no rash 5. Heart: rapid Regular rate and rhythm no  Murmur, no Rub or gallop 6. Lungs:  no wheezes or crackles   7. Abdomen: Soft,  non-tender  obese  bowel sounds present 8. Lower extremities: no clubbing, cyanosis, no  edema 9. Neurologically Grossly intact, moving all 4 extremities equally  non tremouluos 10. MSK: Normal range of motion    Chart has been reviewed  ______________________________________________________________________________________________  Assessment/Plan  56 y.o. male with medical history significant of alcohol abuse   Admitted for   Diverticulitis  Alcohol intoxication in active alcoholic with complication, resulting in alcohol withdrawal Suicidal ideation     Present on Admission:  Acute alcohol intoxication with alcoholism (HCC)  Alcohol withdrawal delirium (HCC)  Diverticulitis  Acute respiratory failure with hypoxia (HCC)  Dehydration     Acute alcohol intoxication with alcoholism (HCC) Initiate CIWA protocol, Would likely benefit from inpatient rehab after discharge  Suicide ideation Order behavioral health consult suicide precautions  Alcohol  withdrawal delirium (HCC) Order CIWA protocol  Diverticulitis Continue Rocephin and Flagyl IV Bowel rest for tonight  Acute respiratory failure with hypoxia (HCC) Noted in the setting of sedation after use of Ativan.   continuous pulse oxygen  , provide oxygen as needed chest x-ray unremarkable  No chest pain would probably benefit from outpatient sleep apnea study Obesity may be contributing to positional hypoxia hypoventilation  Dehydration Will rehydrate Follow CK   Other plan as per orders.  DVT prophylaxis:  SCD      Code Status:    Code Status: Prior FULL CODE    Family Communication:   Family not at  Bedside    Disposition P stable   Following barriers for discharge:                                                          Afebrile, white count improving able to transition to PO antibiotics  Will need to be able to tolerate PO                                          Behavioral health  consulted                    Consults called:     Admission status:  ED Disposition     ED Disposition  Admit   Condition  --   Comment  [id number]  Level of Care: Progressive [102]  Admit to Progressive based on following criteria: NEUROLOGICAL AND NEUROSURGICAL complex patients with significant risk of instability, who do not meet ICU criteria, yet require close observation or frequent assessment (< / = every 2 - 4 hours) with medical / nursing intervention.  [facility] if equivalent level of care is available:: No  Covid Evaluation: Asymptomatic - no recent exposure (last 10 days) testing not required  Diagnosis: Alcohol withdrawal delirium (HCC) [291.0.ICD-9-CM]  Admitting Physician: Therisa DoyneUTOVA,  [[id number]]  Attending Physician: Therisa DoyneUTOVA,  [[id number]]           Obs    Level of care        progressive tele indefinitely please discontinue  once patient no longer qualifies COVID-19 Labs      04/22/2022, 10:21 PM    [facility]     after 2 AM please page floor coverage PA If 7AM-7PM, please contact the day team taking care of the patient using [email or web address]   Patient was evaluated in the context of the global COVID-19 pandemic, which necessitated consideration that the patient might be at risk for infection with the SARS-CoV-2 virus that causes COVID-19. Institutional protocols and algorithms that pertain to the evaluation of patients at risk for COVID-19 are in a state of rapid change based on information released by regulatory bodies including the CDC and federal and state organizations. These policies and algorithms were followed during the patient's care.

## 2022-04-23 ENCOUNTER — Encounter (HOSPITAL_COMMUNITY): Payer: Self-pay | Admitting: Internal Medicine

## 2022-04-23 DIAGNOSIS — F32A Depression, unspecified: Secondary | ICD-10-CM | POA: Diagnosis present

## 2022-04-23 DIAGNOSIS — F10229 Alcohol dependence with intoxication, unspecified: Secondary | ICD-10-CM | POA: Diagnosis present

## 2022-04-23 DIAGNOSIS — F10231 Alcohol dependence with withdrawal delirium: Secondary | ICD-10-CM | POA: Diagnosis present

## 2022-04-23 DIAGNOSIS — Y908 Blood alcohol level of 240 mg/100 ml or more: Secondary | ICD-10-CM | POA: Diagnosis present

## 2022-04-23 DIAGNOSIS — Z1152 Encounter for screening for COVID-19: Secondary | ICD-10-CM | POA: Diagnosis not present

## 2022-04-23 DIAGNOSIS — K5732 Diverticulitis of large intestine without perforation or abscess without bleeding: Secondary | ICD-10-CM | POA: Diagnosis present

## 2022-04-23 DIAGNOSIS — F064 Anxiety disorder due to known physiological condition: Secondary | ICD-10-CM | POA: Diagnosis present

## 2022-04-23 DIAGNOSIS — Z20822 Contact with and (suspected) exposure to covid-19: Secondary | ICD-10-CM | POA: Diagnosis present

## 2022-04-23 DIAGNOSIS — E860 Dehydration: Secondary | ICD-10-CM | POA: Diagnosis present

## 2022-04-23 DIAGNOSIS — M6282 Rhabdomyolysis: Secondary | ICD-10-CM | POA: Diagnosis present

## 2022-04-23 DIAGNOSIS — K760 Fatty (change of) liver, not elsewhere classified: Secondary | ICD-10-CM | POA: Diagnosis present

## 2022-04-23 DIAGNOSIS — J9601 Acute respiratory failure with hypoxia: Secondary | ICD-10-CM | POA: Diagnosis present

## 2022-04-23 DIAGNOSIS — R45851 Suicidal ideations: Secondary | ICD-10-CM | POA: Diagnosis present

## 2022-04-23 DIAGNOSIS — Z79899 Other long term (current) drug therapy: Secondary | ICD-10-CM | POA: Diagnosis not present

## 2022-04-23 DIAGNOSIS — D6959 Other secondary thrombocytopenia: Secondary | ICD-10-CM | POA: Diagnosis present

## 2022-04-23 DIAGNOSIS — F10931 Alcohol use, unspecified with withdrawal delirium: Secondary | ICD-10-CM | POA: Diagnosis present

## 2022-04-23 LAB — COMPREHENSIVE METABOLIC PANEL
ALT: 67 U/L — ABNORMAL HIGH (ref 0–44)
AST: 97 U/L — ABNORMAL HIGH (ref 15–41)
Albumin: 4.1 g/dL (ref 3.5–5.0)
Alkaline Phosphatase: 36 U/L — ABNORMAL LOW (ref 38–126)
Anion gap: 12 (ref 5–15)
BUN: 16 mg/dL (ref 6–20)
CO2: 24 mmol/L (ref 22–32)
Calcium: 7.7 mg/dL — ABNORMAL LOW (ref 8.9–10.3)
Chloride: 103 mmol/L (ref 98–111)
Creatinine, Ser: 0.72 mg/dL (ref 0.61–1.24)
GFR, Estimated: >60 mL/min (ref >60)
Glucose, Bld: 73 mg/dL (ref 70–99)
Potassium: 4 mmol/L (ref 3.5–5.1)
Sodium: 139 mmol/L (ref 135–145)
Total Bilirubin: 0.8 mg/dL (ref 0.3–1.2)
Total Protein: 6.8 g/dL (ref 6.5–8.1)

## 2022-04-23 LAB — URINALYSIS, COMPLETE (UACMP) WITH MICROSCOPIC
Bacteria, UA: NONE SEEN
Bilirubin Urine: NEGATIVE
Glucose, UA: NEGATIVE mg/dL
Hgb urine dipstick: NEGATIVE
Ketones, ur: 20 mg/dL — AB
Leukocytes,Ua: NEGATIVE
Nitrite: NEGATIVE
Protein, ur: NEGATIVE mg/dL
Specific Gravity, Urine: >1.046 — ABNORMAL HIGH (ref 1.005–1.030)
pH: 5 (ref 5.0–8.0)

## 2022-04-23 LAB — CBC
HCT: 43.8 % (ref 39.0–52.0)
Hemoglobin: 14.3 g/dL (ref 13.0–17.0)
MCH: 29.7 pg (ref 26.0–34.0)
MCHC: 32.6 g/dL (ref 30.0–36.0)
MCV: 91.1 fL (ref 80.0–100.0)
Platelets: 111 10*3/uL — ABNORMAL LOW (ref 150–400)
RBC: 4.81 MIL/uL (ref 4.22–5.81)
RDW: 15.8 % — ABNORMAL HIGH (ref 11.5–15.5)
WBC: 5.6 10*3/uL (ref 4.0–10.5)
nRBC: 0 % (ref 0.0–0.2)

## 2022-04-23 LAB — MAGNESIUM: Magnesium: 2.1 mg/dL (ref 1.7–2.4)

## 2022-04-23 LAB — RAPID URINE DRUG SCREEN, HOSP PERFORMED
Amphetamines: NOT DETECTED
Barbiturates: NOT DETECTED
Benzodiazepines: POSITIVE — AB
Cocaine: NOT DETECTED
Opiates: NOT DETECTED
Tetrahydrocannabinol: NOT DETECTED

## 2022-04-23 LAB — PREALBUMIN: Prealbumin: 35 mg/dL (ref 18–38)

## 2022-04-23 LAB — VITAMIN B12: Vitamin B-12: 355 pg/mL (ref 180–914)

## 2022-04-23 LAB — FOLATE: Folate: 8.5 ng/mL (ref >5.9)

## 2022-04-23 LAB — CK: Total CK: 349 U/L (ref 49–397)

## 2022-04-23 LAB — PHOSPHORUS: Phosphorus: 3.2 mg/dL (ref 2.5–4.6)

## 2022-04-23 MED ORDER — BENZTROPINE MESYLATE 0.5 MG PO TABS
1.0000 mg | ORAL_TABLET | Freq: Four times a day (QID) | ORAL | Status: DC | PRN
  Administered 2022-04-24: 1 mg via ORAL
  Filled 2022-04-23: qty 2

## 2022-04-23 MED ORDER — SODIUM CHLORIDE 0.9 % IV SOLN
INTRAVENOUS | Status: DC
  Administered 2022-04-23: 75 mL/h via INTRAVENOUS

## 2022-04-23 MED ORDER — PHENOBARBITAL 32.4 MG PO TABS: 32.4000 mg | ORAL_TABLET | Freq: Three times a day (TID) | ORAL | Status: DC

## 2022-04-23 MED ORDER — CHLORDIAZEPOXIDE HCL 25 MG PO CAPS
25.0000 mg | ORAL_CAPSULE | Freq: Three times a day (TID) | ORAL | Status: DC
  Administered 2022-04-23: 25 mg via ORAL
  Filled 2022-04-23: qty 1

## 2022-04-23 MED ORDER — HYDRALAZINE HCL 20 MG/ML IJ SOLN: 10.0000 mg | INTRAMUSCULAR | Status: DC | PRN

## 2022-04-23 MED ORDER — LORAZEPAM 2 MG/ML IJ SOLN
2.0000 mg | Freq: Once | INTRAMUSCULAR | Status: AC
  Administered 2022-04-23: 2 mg via INTRAVENOUS

## 2022-04-23 MED ORDER — SENNOSIDES-DOCUSATE SODIUM 8.6-50 MG PO TABS: 1.0000 | ORAL_TABLET | Freq: Every evening | ORAL | Status: DC | PRN

## 2022-04-23 MED ORDER — GUAIFENESIN 100 MG/5ML PO LIQD: 5.0000 mL | ORAL | Status: DC | PRN

## 2022-04-23 MED ORDER — CHLORDIAZEPOXIDE HCL 25 MG PO CAPS: 25.0000 mg | ORAL_CAPSULE | Freq: Two times a day (BID) | ORAL | Status: DC

## 2022-04-23 MED ORDER — SODIUM CHLORIDE 0.9 % IV SOLN
INTRAVENOUS | Status: AC
  Administered 2022-04-23: 1000 mL via INTRAVENOUS

## 2022-04-23 MED ORDER — METRONIDAZOLE 500 MG/100ML IV SOLN
500.0000 mg | Freq: Two times a day (BID) | INTRAVENOUS | Status: DC
  Administered 2022-04-23 – 2022-04-26 (×7): 500 mg via INTRAVENOUS
  Filled 2022-04-23 (×7): qty 100

## 2022-04-23 MED ORDER — CHLORDIAZEPOXIDE HCL 25 MG PO CAPS: 25.0000 mg | ORAL_CAPSULE | Freq: Every day | ORAL | Status: DC

## 2022-04-23 MED ORDER — METOPROLOL TARTRATE 5 MG/5ML IV SOLN: 5.0000 mg | INTRAVENOUS | Status: DC | PRN

## 2022-04-23 MED ORDER — IPRATROPIUM-ALBUTEROL 0.5-2.5 (3) MG/3ML IN SOLN: 3.0000 mL | RESPIRATORY_TRACT | Status: DC | PRN

## 2022-04-23 MED ORDER — PHENOBARBITAL 32.4 MG PO TABS
64.8000 mg | ORAL_TABLET | Freq: Three times a day (TID) | ORAL | Status: DC
  Administered 2022-04-25 – 2022-04-26 (×3): 64.8 mg via ORAL
  Filled 2022-04-23 (×3): qty 2

## 2022-04-23 MED ORDER — LORAZEPAM 2 MG/ML IJ SOLN
2.0000 mg | INTRAMUSCULAR | Status: DC | PRN
  Administered 2022-04-23 – 2022-04-25 (×6): 2 mg via INTRAVENOUS
  Filled 2022-04-23 (×6): qty 1

## 2022-04-23 MED ORDER — PHENOBARBITAL 32.4 MG PO TABS
97.2000 mg | ORAL_TABLET | Freq: Three times a day (TID) | ORAL | Status: AC
  Administered 2022-04-23 – 2022-04-25 (×6): 97.2 mg via ORAL
  Filled 2022-04-23 (×6): qty 3

## 2022-04-23 MED ORDER — HALOPERIDOL 5 MG PO TABS: 5.0000 mg | ORAL_TABLET | Freq: Four times a day (QID) | ORAL | Status: DC | PRN

## 2022-04-23 MED ORDER — TRAZODONE HCL 50 MG PO TABS
50.0000 mg | ORAL_TABLET | Freq: Every evening | ORAL | Status: DC | PRN
  Administered 2022-04-25: 50 mg via ORAL
  Filled 2022-04-23: qty 1

## 2022-04-23 MED ORDER — SODIUM CHLORIDE 0.9 % IV SOLN
2.0000 g | INTRAVENOUS | Status: DC
  Administered 2022-04-23 – 2022-04-25 (×3): 2 g via INTRAVENOUS
  Filled 2022-04-23 (×3): qty 20

## 2022-04-23 MED ORDER — OXYCODONE HCL 5 MG PO TABS
5.0000 mg | ORAL_TABLET | ORAL | Status: DC | PRN
  Administered 2022-04-23: 5 mg via ORAL
  Filled 2022-04-23: qty 1

## 2022-04-23 NOTE — Consult Note (Signed)
Brief ED Psychiatry Consult Note  Pt has been admitted to the hospital. I am closing out the TTS consult order - will be seen by Caryn Bee, NP on inpt consult service later today.    A 

## 2022-04-23 NOTE — Progress Notes (Signed)
Pharmacy Phenobarbital Consult Note   Pharmacy Consult for PO Phenobarbital  Indication: Alcohol Withdrawal Treatment  Labs:  SCr 0.72, AST 97, ALT 67, ALKPHOS 36   Assessment: Jermaine [name] is a 56 y.o. male patient admitted with previous hx of alcoholism, depression, anxiety brought to the ER by his sponsor after he became intoxicated and had a rope around his neck. Psychiatry has started detox treatment using CIWA protocol and Ativan.  Seeking inpt detox treatment and also consulted Pharmacy to dose phenobarbital.    Plan: Start high risk taper PO: Phenobarbital 97.2mg  oral q 8h x 6 doses followed by Phenobarbital 64.8mg  oral q 8h x 6 doses followed by Phenobarbital 32.4mg  oral q 8h x 6 doses   Thank you for allowing pharmacy to be a part of this patient's care.  Selinda Eon, PharmD, BCPS Clinical Pharmacist [facility] Please utilize Amion for appropriate phone number to reach the unit pharmacist [facility]) 04/23/2022 2:54 PM

## 2022-04-23 NOTE — Progress Notes (Addendum)
PROGRESS NOTE    Jermaine [name]  HAL:[id number] DOB: [birth year] DOA: 04/22/2022 PCP: Farris Has, MD   Brief Narrative:  56 year old with history of alcohol abuse comes to the hospital with alcohol intoxication and possible suicide attempt.  He also reported abdominal pain and diarrhea.  CT of the abdomen pelvis showed concerns of mild diverticulitis and started on alcohol withdrawal protocol.  Patient was seen by psychiatry team who recommended one-to-one observation for now.   Assessment & Plan:  Principal Problem:   Acute alcohol intoxication with alcoholism (HCC) Active Problems:   Suicide ideation   Alcohol withdrawal delirium (HCC)   Diverticulitis   Acute respiratory failure with hypoxia (HCC)   Dehydration    Proximal sigmoid mild diverticulitis, uncomplicated - On clear liquid diet, advance as tolerated, IV fluids, empiric IV Rocephin and Flagyl.  Eventually will need outpatient colonoscopy next 8 weeks or so.  Alcohol intoxication Possible suicide attempt - On alcohol withdrawal protocol.  Seen by psychiatry.  Will place him on one-to-one sitter.  Will add Phenobar taper.  At very high risk of significant withdrawal, may need Valium/Precedex later -Supportive care.  Mild rhabdomyolysis - Initial CK4 25, improved with IV fluids  Chronic thrombocytopenia/transaminitis - Secondary to alcohol use.  CT scan showing hepatic steatosis.     DVT prophylaxis: SCDs Start: 04/22/22 2242 Code Status: Full Family Communication:    Patient is quite tremulous and tachycardic.  She will remain in the hospital at least for next 24-48 hours as he may undergo severe alcohol withdrawal.  Subjective: Patient seen and examined at bedside in the ED.  He is quite tremulous and tachycardic with heart rate of 110.  Tells me he typically drinks about 24 cans of white claw for the past several days.  His last alcoholic drink was on 04/22/2022.   Examination:  General exam: Tremulous  and very anxious Respiratory system: Clear to auscultation. Respiratory effort normal. Cardiovascular system: Sinus tachycardia Gastrointestinal system: Abdomen is nondistended, soft and nontender. No organomegaly or masses felt. Normal bowel sounds heard. Central nervous system: Alert and oriented. No focal neurological deficits. Extremities: Symmetric 5 x 5 power. Skin: No rashes, lesions or ulcers Psychiatry: Judgement and insight appear normal. Mood & affect appropriate.     Objective: Vitals:   04/23/22 0630 04/23/22 0700 04/23/22 0747 04/23/22 0851  BP: (!) 129/92 (!) 145/89  (!) 148/92  Pulse: 79 77  81  Resp: 15 16    Temp:   98.6 F (37 C)   TempSrc:   Oral   SpO2: 98% 97%     No intake or output data in the 24 hours ending 04/23/22 0920 There were no vitals filed for this visit.   Data Reviewed:   CBC: Recent Labs  Lab 04/22/22 1527 04/23/22 0500  WBC 6.5 5.6  NEUTROABS 4.2  --   HGB 16.9 14.3  HCT 50.2 43.8  MCV 89.5 91.1  PLT 155 111*   Basic Metabolic Panel: Recent Labs  Lab 04/22/22 1527 04/22/22 1539 04/22/22 2125 04/23/22 0500  NA 138  --   --  139  K 3.9  --   --  4.0  CL 96*  --   --  103  CO2 25  --   --  24  GLUCOSE 99  --   --  73  BUN 16  --   --  16  CREATININE 1.01  --   --  0.72  CALCIUM 8.8*  --   --  7.7*  MG  --  2.2  --  2.1  PHOS  --   --  3.5 3.2   GFR: CrCl cannot be calculated (Unknown ideal weight.). Liver Function Tests: Recent Labs  Lab 04/22/22 1527 04/23/22 0500  AST 113* 97*  ALT 75* 67*  ALKPHOS 49 36*  BILITOT 1.2 0.8  PROT 8.1 6.8  ALBUMIN 5.0 4.1   No results for input(s): "LIPASE", "AMYLASE" in the last 168 hours. No results for input(s): "AMMONIA" in the last 168 hours. Coagulation Profile: Recent Labs  Lab 04/22/22 2125  INR 1.1   Cardiac Enzymes: Recent Labs  Lab 04/22/22 2125 04/23/22 0500  CKTOTAL 425* 349   BNP (last 3 results) No results for input(s): "PROBNP" in the last 8760  hours. HbA1C: No results for input(s): "HGBA1C" in the last 72 hours. CBG: No results for input(s): "GLUCAP" in the last 168 hours. Lipid Profile: No results for input(s): "CHOL", "HDL", "LDLCALC", "TRIG", "CHOLHDL", "LDLDIRECT" in the last 72 hours. Thyroid Function Tests: Recent Labs    04/22/22 2125  TSH 0.544   Anemia Panel: No results for input(s): "VITAMINB12", "FOLATE", "FERRITIN", "TIBC", "IRON", "RETICCTPCT" in the last 72 hours. Sepsis Labs: No results for input(s): "PROCALCITON", "LATICACIDVEN" in the last 168 hours.  Recent Results (from the past 240 hour(s))  Resp Panel by RT-PCR (Flu A&B, Covid) Anterior Nasal Swab     Status: None   Collection Time: 04/22/22  9:25 PM   Specimen: Anterior Nasal Swab  Result Value Ref Range Status   SARS Coronavirus 2 by RT PCR NEGATIVE NEGATIVE Final    Comment: (NOTE) SARS-CoV-2 target nucleic acids are NOT DETECTED.  The SARS-CoV-2 RNA is generally detectable in upper respiratory specimens during the acute phase of infection. The lowest concentration of SARS-CoV-2 viral copies this assay can detect is 138 copies/mL. A negative result does not preclude SARS-Cov-2 infection and should not be used as the sole basis for treatment or other patient management decisions. A negative result may occur with  improper specimen collection/handling, submission of specimen other than nasopharyngeal swab, presence of viral mutation(s) within the areas targeted by this assay, and inadequate number of viral copies(<138 copies/mL). A negative result must be combined with clinical observations, patient history, and epidemiological information. The expected result is Negative.  Fact Sheet for Patients:  BloggerCourse.com  Fact Sheet for Healthcare Providers:  SeriousBroker.it  This test is no t yet approved or cleared by the [place] FDA and  has been authorized for detection and/or  diagnosis of SARS-CoV-2 by FDA under an Emergency Use Authorization (EUA). This EUA will remain  in effect (meaning this test can be used) for the duration of the COVID-19 declaration under Section 564(b)(1) of the Act, 21 U.S.C.section 360bbb-3(b)(1), unless the authorization is terminated  or revoked sooner.       Influenza A by PCR NEGATIVE NEGATIVE Final   Influenza B by PCR NEGATIVE NEGATIVE Final    Comment: (NOTE) The Xpert Xpress SARS-CoV-2/FLU/RSV plus assay is intended as an aid in the diagnosis of influenza from Nasopharyngeal swab specimens and should not be used as a sole basis for treatment. Nasal washings and aspirates are unacceptable for Xpert Xpress SARS-CoV-2/FLU/RSV testing.  Fact Sheet for Patients: BloggerCourse.com  Fact Sheet for Healthcare Providers: SeriousBroker.it  This test is not yet approved or cleared by the [place] FDA and has been authorized for detection and/or diagnosis of SARS-CoV-2 by FDA under an Emergency Use Authorization (EUA). This EUA will remain  in effect (meaning this test can be used) for the duration of the COVID-19 declaration under Section 564(b)(1) of the Act, 21 U.S.C. section 360bbb-3(b)(1), unless the authorization is terminated or revoked.  Performed at [facility], [address], [place], [place] [address]          Radiology Studies: CT Abdomen Pelvis W Contrast  Result Date: 04/22/2022 CLINICAL DATA:  Abdominal pain. EXAM: CT ABDOMEN AND PELVIS WITH CONTRAST TECHNIQUE: Multidetector CT imaging of the abdomen and pelvis was performed using the standard protocol following bolus administration of intravenous contrast. RADIATION DOSE REDUCTION: This exam was performed according to the departmental dose-optimization program which includes automated exposure control, adjustment of the mA and/or kV according to patient size and/or use of iterative  reconstruction technique. CONTRAST:  OMNIPAQUE IOHEXOL 300 MG/ML  SOLN COMPARISON:  None Available. FINDINGS: Lower chest: No acute abnormality. Hepatobiliary: There is diffuse fatty infiltration of the liver parenchyma. A 7 mm focus of parenchymal low attenuation is seen within the right of the liver (axial CT image 14, CT series 2). The gallbladder is moderately distended. No gallstones, gallbladder wall thickening, or biliary dilatation. Pancreas: Unremarkable. No pancreatic ductal dilatation or surrounding inflammatory changes. Spleen: Normal in size without focal abnormality. Adrenals/Urinary Tract: Adrenal glands are unremarkable. Kidneys are normal, without renal calculi, focal lesion, or hydronephrosis. The urinary bladder is moderately distended and is otherwise unremarkable. Stomach/Bowel: Stomach is within normal limits. Appendix appears normal. No evidence of bowel wall thickening, distention, or inflammatory changes. Numerous diverticula are seen throughout the transverse, descending and sigmoid colon. Mild thickening of the proximal sigmoid colon is also seen. Vascular/Lymphatic: Aortic atherosclerosis. No enlarged abdominal or pelvic lymph nodes. Reproductive: The prostate gland is mildly enlarged. Other: A 2.1 cm x 1.7 cm fat containing scrotal hernia is seen on the right. No abdominopelvic ascites. Musculoskeletal: No acute or significant osseous findings. IMPRESSION: 1. Colonic diverticulosis with mild thickening of the proximal sigmoid colon which may represent mild diverticulitis. 2. Hepatic steatosis. 3. Findings which may represent a small hepatic cyst versus hemangioma. Correlation with nonemergent hepatic ultrasound is recommended. 4. Aortic atherosclerosis. Aortic Atherosclerosis (ICD10-I70.0). Electronically Signed   By: Aram Candela M.D.   On: 04/22/2022 19:47   DG Chest 2 View  Result Date: 04/22/2022 CLINICAL DATA:  Suicidal EXAM: CHEST - 2 VIEW COMPARISON:  09/22/2021  FINDINGS: The heart size and mediastinal contours are within normal limits. Both lungs are clear. The visualized skeletal structures are unremarkable. IMPRESSION: No acute abnormality of the lungs. Electronically Signed   By: Jearld Lesch M.D.   On: 04/22/2022 15:57        Scheduled Meds:  escitalopram  20 mg Oral Daily   LORazepam  0-4 mg Intravenous Q6H   Or   LORazepam  0-4 mg Oral Q6H   [START ON 04/24/2022] LORazepam  0-4 mg Intravenous Q12H   Or   [START ON 04/24/2022] LORazepam  0-4 mg Oral Q12H   thiamine  100 mg Oral Daily   Or   thiamine  100 mg Intravenous Daily   Continuous Infusions:  cefTRIAXone (ROCEPHIN)  IV     metronidazole 500 mg (04/23/22 0912)     LOS: 0 days   Time spent= 35 mins     Joline Maxcy, MD [facility]  If 7PM-7AM, please contact night-coverage  04/23/2022, 9:20 AM

## 2022-04-23 NOTE — Consult Note (Cosign Needed Addendum)
Patient was seen and assessed by psychiatric TTS service yesterday evening.  Psychiatric consult service will continue to follow as patient has been admitted for alcohol withdrawal symptoms.    Jermaine [name] has been admitted under routine precautions for his detox, and will likely require stepdown or intensive care during his hospitalization.  He does report history of ICU admission due to severe alcohol related withdrawal symptoms and delirium tremens.  Patient is able to participate to a certain degree with psychiatric evaluation, he did present confused at times, although he answered his orientation questions appropriately and completed cam ICU screening.  Patient reports drinking about a(2) cases of beer daily for the past 4 years.  He identifies his longest period of sobriety of 15 days with previous inpatient psychiatric hospitalization.  He does report a history of delirium tremens, which has complicated his alcohol withdrawals and recovery.     While he denies active suicidality, he also denies recent suicide attempt by hanging.  He does follow up with this statement of " I am detoxing right now and seeing things, I do not really know what happened or what I said."  When assessing for hallucinations, he does endorse visual hallucinations that consist of "things floating around the room".  Patient reports hallucinations started this morning, and prefers the lights be off as he is unable to see the hallucinations when the lights are out.  He further reports being very restless, and dizzy.  On examination, patient is alert and oriented x 3; calm and cooperative; yet very restless.  He is observed to be tossing and turning, and writhing in bed.  He is also noted to be very tremulous, and exterior frame of stretcher is shaking due to his tremors. Tremor is present and observed to be severe, resting tremor, present in the upper extremities, symmetrically. Additional neuromuscular exam discloses difficulty  initiating automatic movements and normal neurological exam without other Parkinsonian features.   Will attempt to coordinate with hospitalist, appropriate detox regimen to include phenobarbital consult with pharmacy.   Will also order alcohol related labs to include B12, B1, and folic acid. -Will start low-dose agitation protocol for alcohol to include Haldol and Cogentin if needed. -Continue alcohol/CIWA detox protocol at this time.  Historically this has been effective for patient. Labs reviewed on admission include blood alcohol level of 366, urine drug screen positive for benzodiazepines; EKG reviewed QTc of 427 (with correction [place]). -Patient is currently voluntary, while he currently denies any active suicidal ideations and recent suicide attempt; he remains high risk for suicide completion.  In the event he attempts to leave hospital AMA, he does qualify for IVC due to risk factors of recent suicide attempt (allegedly), Caucasian male, age, history of suicide attempts, history of previous psychiatric conditions.  Psychiatric consult service will continue to follow, above findings have been discussed with hospitalist Dr. Nelson Chimes.

## 2022-04-24 ENCOUNTER — Encounter (HOSPITAL_COMMUNITY): Payer: Self-pay | Admitting: Internal Medicine

## 2022-04-24 DIAGNOSIS — F10229 Alcohol dependence with intoxication, unspecified: Secondary | ICD-10-CM | POA: Diagnosis not present

## 2022-04-24 DIAGNOSIS — F10931 Alcohol use, unspecified with withdrawal delirium: Secondary | ICD-10-CM

## 2022-04-24 LAB — COMPREHENSIVE METABOLIC PANEL
ALT: 67 U/L — ABNORMAL HIGH (ref 0–44)
AST: 86 U/L — ABNORMAL HIGH (ref 15–41)
Albumin: 3.9 g/dL (ref 3.5–5.0)
Alkaline Phosphatase: 37 U/L — ABNORMAL LOW (ref 38–126)
Anion gap: 10 (ref 5–15)
BUN: 12 mg/dL (ref 6–20)
CO2: 27 mmol/L (ref 22–32)
Calcium: 7.6 mg/dL — ABNORMAL LOW (ref 8.9–10.3)
Chloride: 98 mmol/L (ref 98–111)
Creatinine, Ser: 0.72 mg/dL (ref 0.61–1.24)
GFR, Estimated: >60 mL/min (ref >60)
Glucose, Bld: 83 mg/dL (ref 70–99)
Potassium: 3.5 mmol/L (ref 3.5–5.1)
Sodium: 135 mmol/L (ref 135–145)
Total Bilirubin: 1 mg/dL (ref 0.3–1.2)
Total Protein: 6.6 g/dL (ref 6.5–8.1)

## 2022-04-24 LAB — CBC
HCT: 42.1 % (ref 39.0–52.0)
Hemoglobin: 13.8 g/dL (ref 13.0–17.0)
MCH: 30.1 pg (ref 26.0–34.0)
MCHC: 32.8 g/dL (ref 30.0–36.0)
MCV: 91.7 fL (ref 80.0–100.0)
Platelets: 107 10*3/uL — ABNORMAL LOW (ref 150–400)
RBC: 4.59 MIL/uL (ref 4.22–5.81)
RDW: 15.3 % (ref 11.5–15.5)
WBC: 5.1 10*3/uL (ref 4.0–10.5)
nRBC: 0 % (ref 0.0–0.2)

## 2022-04-24 LAB — MAGNESIUM: Magnesium: 2.1 mg/dL (ref 1.7–2.4)

## 2022-04-24 MED ORDER — POTASSIUM CHLORIDE CRYS ER 20 MEQ PO TBCR
40.0000 meq | EXTENDED_RELEASE_TABLET | Freq: Once | ORAL | Status: AC
  Administered 2022-04-24: 40 meq via ORAL
  Filled 2022-04-24: qty 2

## 2022-04-24 MED ORDER — ADULT MULTIVITAMIN W/MINERALS CH
1.0000 | ORAL_TABLET | Freq: Every day | ORAL | Status: DC
  Administered 2022-04-24 – 2022-04-26 (×3): 1 via ORAL
  Filled 2022-04-24 (×3): qty 1

## 2022-04-24 MED ORDER — ORAL CARE MOUTH RINSE: 15.0000 mL | OROMUCOSAL | Status: DC | PRN

## 2022-04-24 NOTE — Consult Note (Signed)
[facility] Face-to-Face Psychiatry Consult   Reason for Consult: Alcohol withdrawal, possible suicide attempt Referring Physician:  Dr. Nelson Chimes Patient Identification: Jermaine [name] MRN:  [id number] Principal Diagnosis: Acute alcohol intoxication with alcoholism (HCC) Diagnosis:  Principal Problem:   Acute alcohol intoxication with alcoholism (HCC) Active Problems:   Alcohol withdrawal (HCC)   Suicide ideation   Alcohol withdrawal delirium (HCC)   Diverticulitis   Acute respiratory failure with hypoxia (HCC)   Dehydration   Total Time spent with patient: 1 hour  Subjective:   Jermaine [name] is a 56 y.o. male patient admitted with alcohol withdrawal. On evaluation patient is seen in assessed, chart review, consulted with Dr. Gasper Sells and primary team.  Patient was observed to be sitting upright in bed, eating lunch.  He had a noticeable reduction in tremors, as he was able to feed himself unassisted without food falling.  Patient reports having great support to include his AA sponsor, mother, and sister.  He denies any history of suicidality, and furthermore reports ever attempting suicide.  While he is unable to recall what was stated, he does report being under the influence of alcohol and therefore was intoxicated.  He denies ever having a rope, and or device to hang himself and or inflicted bodily harm.  Patient reports he describes his depression and anxiety stable, however has been a noticeable increase in alcohol intake of 1-2 cases of beer a day.  He does report drinking daily for the past few years.  He reports history of alcohol withdrawal seizures, ICU admission due to alcohol related complications and with medical detox, and delirium tremens.  Today he is experiencing some shakes and tremors, however much improved from yesterday.  He reports compliance with his medication as he is currently taking Lexapro for depression and anxiety from his primary care provider.  He is offered outpatient  psychiatric services to include inpatient rehab, outpatient rehab, CDIOP, and traditional cognitive behavioral therapy all of which patient has declined.  He states he is very familiar with services, and has a AA sponsor and is not interested in any additional services at this time.  We will continue with detox treatment to include CIWA protocol, Ativan, and phenobarbital.  Patient appears to be having a positive therapeutic response from loading dose of phenobarbital, and maintenance dose at this time.  He continues to deny suicidality, homicidality, and or auditory or visual hallucinations.   Will obtain consent from patient to reach out to AA sponsor, for collateral information.  However patient does not appear to be of acute and or immediate danger to self or others.  He currently does not meet criteria for [facility] involuntary commitment.  HPI:  56 year old with history of alcohol abuse comes to the hospital with alcohol intoxication and possible suicide attempt.  He also reported abdominal pain and diarrhea.  CT of the abdomen pelvis showed concerns of mild diverticulitis and started on alcohol withdrawal protocol.  Patient was seen by psychiatry team who recommended one-to-one observation for now.  Patient started on phenobarbital taper.    Past Psychiatric History: Alcohol withdrawal delirium, depression, anxiety.  Currently compliant with Lexapro p.o. daily from primary care provider.  Denies any history of suicidal attempt, denies suicidal self-injurious behavior.  He denies any history and or recent use of illicit substances.  He denies any current legal charges.  Risk to Self:  Denies Risk to Others:  Denies Prior Inpatient Therapy:  Denies Prior Outpatient Therapy:  Denies  Past Medical History:  Past  Medical History:  Diagnosis Date   Alcohol abuse    Anxiety    Class 1 obesity 09/14/2021   Depression     Past Surgical History:  Procedure Laterality Date   HERNIA REPAIR      Family History:  Family History  Adopted: Yes   Family Psychiatric  History:  Social History:  Social History   Substance and Sexual Activity  Alcohol Use Yes   Alcohol/week: 20.0 standard drinks of alcohol   Types: 20 Standard drinks or equivalent per week     Social History   Substance and Sexual Activity  Drug Use Not Currently    Social History   Socioeconomic History   Marital status: Single    Spouse name: Not on file   Number of children: Not on file   Years of education: Not on file   Highest education level: Not on file  Occupational History   Not on file  Tobacco Use   Smoking status: Never   Smokeless tobacco: Never  Vaping Use   Vaping Use: Never used  Substance and Sexual Activity   Alcohol use: Yes    Alcohol/week: 20.0 standard drinks of alcohol    Types: 20 Standard drinks or equivalent per week   Drug use: Not Currently   Sexual activity: Not on file  Other Topics Concern   Not on file  Social History Narrative   Not on file   Social Determinants of Health   Financial Resource Strain: Not on file  Food Insecurity: No Food Insecurity (04/23/2022)   Hunger Vital Sign    Worried About Running Out of Food in the Last Year: Never true    Ran Out of Food in the Last Year: Never true  Transportation Needs: No Transportation Needs (04/23/2022)   PRAPARE - Administrator, Civil Service (Medical): No    Lack of Transportation (Non-Medical): No  Physical Activity: Not on file  Stress: Not on file  Social Connections: Not on file   Additional Social History:    Allergies:  No Known Allergies  Labs:  Results for orders placed or performed during the hospital encounter of 04/22/22 (from the past 48 hour(s))  Resp Panel by RT-PCR (Flu A&B, Covid) Anterior Nasal Swab     Status: None   Collection Time: 04/22/22  9:25 PM   Specimen: Anterior Nasal Swab  Result Value Ref Range   SARS Coronavirus 2 by RT PCR NEGATIVE NEGATIVE     Comment: (NOTE) SARS-CoV-2 target nucleic acids are NOT DETECTED.  The SARS-CoV-2 RNA is generally detectable in upper respiratory specimens during the acute phase of infection. The lowest concentration of SARS-CoV-2 viral copies this assay can detect is 138 copies/mL. A negative result does not preclude SARS-Cov-2 infection and should not be used as the sole basis for treatment or other patient management decisions. A negative result may occur with  improper specimen collection/handling, submission of specimen other than nasopharyngeal swab, presence of viral mutation(s) within the areas targeted by this assay, and inadequate number of viral copies(<138 copies/mL). A negative result must be combined with clinical observations, patient history, and epidemiological information. The expected result is Negative.  Fact Sheet for Patients:  BloggerCourse.com  Fact Sheet for Healthcare Providers:  SeriousBroker.it  This test is no t yet approved or cleared by the [place] FDA and  has been authorized for detection and/or diagnosis of SARS-CoV-2 by FDA under an Emergency Use Authorization (EUA). This EUA will remain  in  effect (meaning this test can be used) for the duration of the COVID-19 declaration under Section 564(b)(1) of the Act, 21 U.S.C.section 360bbb-3(b)(1), unless the authorization is terminated  or revoked sooner.       Influenza A by PCR NEGATIVE NEGATIVE   Influenza B by PCR NEGATIVE NEGATIVE    Comment: (NOTE) The Xpert Xpress SARS-CoV-2/FLU/RSV plus assay is intended as an aid in the diagnosis of influenza from Nasopharyngeal swab specimens and should not be used as a sole basis for treatment. Nasal washings and aspirates are unacceptable for Xpert Xpress SARS-CoV-2/FLU/RSV testing.  Fact Sheet for Patients: BloggerCourse.com  Fact Sheet for Healthcare  Providers: SeriousBroker.it  This test is not yet approved or cleared by the [place] FDA and has been authorized for detection and/or diagnosis of SARS-CoV-2 by FDA under an Emergency Use Authorization (EUA). This EUA will remain in effect (meaning this test can be used) for the duration of the COVID-19 declaration under Section 564(b)(1) of the Act, 21 U.S.C. section 360bbb-3(b)(1), unless the authorization is terminated or revoked.  Performed at [facility], [address], [place], [place] [address]   CK     Status: Abnormal   Collection Time: 04/22/22  9:25 PM  Result Value Ref Range   Total CK 425 (H) 49 - 397 U/L    Comment: Performed at [facility], [address] [place], [place] [address]  Phosphorus     Status: None   Collection Time: 04/22/22  9:25 PM  Result Value Ref Range   Phosphorus 3.5 2.5 - 4.6 mg/dL    Comment: Performed at [facility], [address], [place], [place] [address]  Protime-INR     Status: None   Collection Time: 04/22/22  9:25 PM  Result Value Ref Range   Prothrombin Time 13.9 11.4 - 15.2 seconds   INR 1.1 0.8 - 1.2    Comment: (NOTE) INR goal varies based on device and disease states. Performed at [facility], [address] [place] [address] [place], [place] [address]   TSH     Status: None   Collection Time: 04/22/22  9:25 PM  Result Value Ref Range   TSH 0.544 0.350 - 4.500 uIU/mL    Comment: Performed by a 3rd Generation assay with a functional sensitivity of <=0.01 uIU/mL. Performed at [facility], [address] [place], [place] [address]   Blood gas, venous     Status: Abnormal   Collection Time: 04/22/22 11:25 PM  Result Value Ref Range   pH, Ven 7.41 7.25 - 7.43   pCO2, Ven 46 44 - 60 mmHg   pO2, Ven 90 (H) 32 - 45 mmHg   Bicarbonate 29.2 (H) 20.0 - 28.0 mmol/L   Acid-Base Excess 3.8 (H) 0.0 - 2.0 mmol/L   O2  Saturation 97.5 %   Patient temperature 37.0     Comment: Performed at [facility], [address], [place], [place] [address]  Urine rapid drug screen (hosp performed)     Status: Abnormal   Collection Time: 04/23/22  2:36 AM  Result Value Ref Range   Opiates NONE DETECTED NONE DETECTED   Cocaine NONE DETECTED NONE DETECTED   Benzodiazepines POSITIVE (A) NONE DETECTED   Amphetamines NONE DETECTED NONE DETECTED   Tetrahydrocannabinol NONE DETECTED NONE DETECTED   Barbiturates NONE DETECTED NONE DETECTED    Comment: (NOTE) DRUG SCREEN FOR MEDICAL PURPOSES ONLY.  IF CONFIRMATION IS NEEDED FOR ANY PURPOSE,  NOTIFY LAB WITHIN 5 DAYS.  LOWEST DETECTABLE LIMITS FOR URINE DRUG SCREEN Drug Class                     Cutoff (ng/mL) Amphetamine and metabolites    1000 Barbiturate and metabolites    200 Benzodiazepine                 200 Opiates and metabolites        300 Cocaine and metabolites        300 THC                            50 Performed at [facility], [address] [place], [place] [address]   Urinalysis, Complete w Microscopic     Status: Abnormal   Collection Time: 04/23/22  2:37 AM  Result Value Ref Range   Color, Urine YELLOW YELLOW   APPearance CLEAR CLEAR   Specific Gravity, Urine >1.046 (H) 1.005 - 1.030   pH 5.0 5.0 - 8.0   Glucose, UA NEGATIVE NEGATIVE mg/dL   Hgb urine dipstick NEGATIVE NEGATIVE   Bilirubin Urine NEGATIVE NEGATIVE   Ketones, ur 20 (A) NEGATIVE mg/dL   Protein, ur NEGATIVE NEGATIVE mg/dL   Nitrite NEGATIVE NEGATIVE   Leukocytes,Ua NEGATIVE NEGATIVE   WBC, UA 0-5 0 - 5 WBC/hpf   Bacteria, UA NONE SEEN NONE SEEN    Comment: Performed at [facility], [address] [place], [place] [address]  Prealbumin     Status: None   Collection Time: 04/23/22  5:00 AM  Result Value Ref Range   Prealbumin 35 18 - 38 mg/dL    Comment: Performed at [facility], [address]  [place], [place] [address]  Comprehensive metabolic panel     Status: Abnormal   Collection Time: 04/23/22  5:00 AM  Result Value Ref Range   Sodium 139 135 - 145 mmol/L   Potassium 4.0 3.5 - 5.1 mmol/L   Chloride 103 98 - 111 mmol/L   CO2 24 22 - 32 mmol/L   Glucose, Bld 73 70 - 99 mg/dL    Comment: Glucose reference range applies only to samples taken after fasting for at least 8 hours.   BUN 16 6 - 20 mg/dL   Creatinine, Ser 9.14 0.61 - 1.24 mg/dL   Calcium 7.7 (L) 8.9 - 10.3 mg/dL   Total Protein 6.8 6.5 - 8.1 g/dL   Albumin 4.1 3.5 - 5.0 g/dL   AST 97 (H) 15 - 41 U/L   ALT 67 (H) 0 - 44 U/L   Alkaline Phosphatase 36 (L) 38 - 126 U/L   Total Bilirubin 0.8 0.3 - 1.2 mg/dL   GFR, Estimated >78 >29 mL/min    Comment: (NOTE) Calculated using the CKD-EPI Creatinine Equation (2021)    Anion gap 12 5 - 15    Comment: Performed at [facility], [address], [place], [place] [address]  CBC     Status: Abnormal   Collection Time: 04/23/22  5:00 AM  Result Value Ref Range   WBC 5.6 4.0 - 10.5 K/uL   RBC 4.81 4.22 - 5.81 MIL/uL   Hemoglobin 14.3 13.0 - 17.0 g/dL   HCT 08.6 57.8 - 46.9 %   MCV 91.1 80.0 - 100.0 fL   MCH 29.7 26.0 - 34.0 pg   MCHC 32.6 30.0 - 36.0 g/dL   RDW  15.8 (H) 11.5 - 15.5 %   Platelets 111 (L) 150 - 400 K/uL   nRBC 0.0 0.0 - 0.2 %    Comment: Performed at [facility], [address] [place], [place] [address]  Magnesium     Status: None   Collection Time: 04/23/22  5:00 AM  Result Value Ref Range   Magnesium 2.1 1.7 - 2.4 mg/dL    Comment: Performed at [facility], [address], [place], [place] [address]  Phosphorus     Status: None   Collection Time: 04/23/22  5:00 AM  Result Value Ref Range   Phosphorus 3.2 2.5 - 4.6 mg/dL    Comment: Performed at [facility], [address], [place], [place] [address]  CK     Status: None   Collection Time: 04/23/22  5:00 AM  Result  Value Ref Range   Total CK 349 49 - 397 U/L    Comment: Performed at [facility], [address], [place], [place] [address]  Vitamin B12     Status: None   Collection Time: 04/23/22  6:05 PM  Result Value Ref Range   Vitamin B-12 355 180 - 914 pg/mL    Comment: (NOTE) This assay is not validated for testing neonatal or myeloproliferative syndrome specimens for Vitamin B12 levels. Performed at [facility], [address], [place], [place] [address]   Folate     Status: None   Collection Time: 04/23/22  6:05 PM  Result Value Ref Range   Folate 8.5 >5.9 ng/mL    Comment: Performed at [facility], [address], [place], [place] [address]  Comprehensive metabolic panel     Status: Abnormal   Collection Time: 04/24/22  4:02 AM  Result Value Ref Range   Sodium 135 135 - 145 mmol/L   Potassium 3.5 3.5 - 5.1 mmol/L   Chloride 98 98 - 111 mmol/L   CO2 27 22 - 32 mmol/L   Glucose, Bld 83 70 - 99 mg/dL    Comment: Glucose reference range applies only to samples taken after fasting for at least 8 hours.   BUN 12 6 - 20 mg/dL   Creatinine, Ser 5.78 0.61 - 1.24 mg/dL   Calcium 7.6 (L) 8.9 - 10.3 mg/dL   Total Protein 6.6 6.5 - 8.1 g/dL   Albumin 3.9 3.5 - 5.0 g/dL   AST 86 (H) 15 - 41 U/L   ALT 67 (H) 0 - 44 U/L   Alkaline Phosphatase 37 (L) 38 - 126 U/L   Total Bilirubin 1.0 0.3 - 1.2 mg/dL   GFR, Estimated >46 >96 mL/min    Comment: (NOTE) Calculated using the CKD-EPI Creatinine Equation (2021)    Anion gap 10 5 - 15    Comment: Performed at [facility], [address], [place], [place] [address]  CBC     Status: Abnormal   Collection Time: 04/24/22  4:02 AM  Result Value Ref Range   WBC 5.1 4.0 - 10.5 K/uL   RBC 4.59 4.22 - 5.81 MIL/uL   Hemoglobin 13.8 13.0 - 17.0 g/dL   HCT 41.3 24.4 - 01.0 %   MCV 91.7 80.0 - 100.0 fL   MCH 30.1 26.0 - 34.0 pg   MCHC 32.8 30.0 - 36.0 g/dL   RDW 27.2 53.6 -  64.4 %   Platelets 107 (L) 150 - 400 K/uL    Comment: Immature Platelet Fraction  may be clinically indicated, consider ordering this additional test [id number] REPEATED TO VERIFY    nRBC 0.0 0.0 - 0.2 %    Comment: Performed at [facility], [address] [place], [place] [address]  Magnesium     Status: None   Collection Time: 04/24/22  4:02 AM  Result Value Ref Range   Magnesium 2.1 1.7 - 2.4 mg/dL    Comment: Performed at [facility], [address] [place], [place] [id number]    Current Facility-Administered Medications  Medication Dose Route Frequency Provider Last Rate Last Admin   haloperidol (HALDOL) tablet 5 mg  5 mg Oral Q6H PRN Maryagnes AmosStarkes-Perry,  S, FNP       And   benztropine (COGENTIN) tablet 1 mg  1 mg Oral Q6H PRN Maryagnes AmosStarkes-Perry,  S, FNP   1 mg at 04/24/22 0834   cefTRIAXone (ROCEPHIN) 2 g in sodium chloride 0.9 % 100 mL IVPB  2 g Intravenous Q24H Therisa Doyneoutova, Anastassia, MD   Stopped at 04/24/22 0640   escitalopram (LEXAPRO) tablet 20 mg  20 mg Oral Daily Therisa Doyneoutova, Anastassia, MD   20 mg at 04/24/22 0834   guaiFENesin (ROBITUSSIN) 100 MG/5ML liquid 5 mL  5 mL Oral Q4H PRN Amin, Ankit Chirag, MD       hydrALAZINE (APRESOLINE) injection 10 mg  10 mg Intravenous Q4H PRN Amin, Ankit Chirag, MD       ipratropium-albuterol (DUONEB) 0.5-2.5 (3) MG/3ML nebulizer solution 3 mL  3 mL Nebulization Q4H PRN Amin, Ankit Chirag, MD       LORazepam (ATIVAN) injection 0-4 mg  0-4 mg Intravenous Q12H Doutova, Anastassia, MD       Or   LORazepam (ATIVAN) tablet 0-4 mg  0-4 mg Oral Q12H Doutova, Anastassia, MD       LORazepam (ATIVAN) injection 2 mg  2 mg Intravenous Q2H PRN Amin, Ankit Chirag, MD   2 mg at 04/24/22 1504   metoprolol tartrate (LOPRESSOR) injection 5 mg  5 mg Intravenous Q4H PRN Amin, Ankit Chirag, MD       metroNIDAZOLE (FLAGYL) IVPB 500 mg  500 mg Intravenous Q12H Doutova, Anastassia, MD 100 mL/hr at 04/24/22 0840 500 mg at  04/24/22 0840   multivitamin with minerals tablet 1 tablet  1 tablet Oral Daily Amin, Ankit Chirag, MD       Oral care mouth rinse  15 mL Mouth Rinse PRN Amin, Ankit Chirag, MD       oxyCODONE (Oxy IR/ROXICODONE) immediate release tablet 5 mg  5 mg Oral Q4H PRN Amin, Ankit Chirag, MD   5 mg at 04/23/22 1743   PHENobarbital (LUMINAL) tablet 97.2 mg  97.2 mg Oral Q8H Lynden AngJames, Melissa, RPH   97.2 mg at 04/24/22 1459   Followed by   Melene Muller[START ON 04/25/2022] PHENobarbital (LUMINAL) tablet 64.8 mg  64.8 mg Oral Q8H James, Melissa, RPH       Followed by   Melene Muller[START ON 04/27/2022] PHENobarbital (LUMINAL) tablet 32.4 mg  32.4 mg Oral Q8H James, Melissa, RPH       senna-docusate (Senokot-S) tablet 1 tablet  1 tablet Oral QHS PRN Amin, Ankit Chirag, MD       thiamine (VITAMIN B1) tablet 100 mg  100 mg Oral Daily Doutova, Anastassia, MD   100 mg at 04/24/22 [id number]   Or   thiamine (VITAMIN B1) injection 100 mg  100 mg Intravenous Daily Doutova, Anastassia, MD   100 mg at 04/23/22 0912   traZODone (DESYREL) tablet 50 mg  50 mg Oral QHS PRN Amin, Loura Halt, MD        Musculoskeletal: Strength & Muscle Tone: within normal limits Gait & Station: normal Patient leans: N/A            Psychiatric Specialty Exam:  Presentation  General Appearance:  Appropriate for Environment; Casual  Eye Contact: Good  Speech: Clear and Coherent; Normal Rate  Speech Volume: Normal  Handedness: Right   Mood and Affect  Mood: Euthymic  Affect: Appropriate; Congruent   Thought Process  Thought Processes: Coherent; Goal Directed  Descriptions of Associations:Intact  Orientation:Full (Time, Place and Person)  Thought Content:WDL  History of Schizophrenia/Schizoaffective disorder:No data recorded Duration of Psychotic Symptoms:No data recorded Hallucinations:Hallucinations: None  Ideas of Reference:None  Suicidal Thoughts:Suicidal Thoughts: No  Homicidal Thoughts:Homicidal Thoughts:  No   Sensorium  Memory: Immediate Good; Remote Good; Recent Good  Judgment: Fair  Insight: Fair   Art therapist  Concentration: Fair  Attention Span: Good  Recall: Good  Fund of Knowledge: Good  Language: Fair   Psychomotor Activity  Psychomotor Activity: Psychomotor Activity: Normal   Assets  Assets: Communication Skills; Desire for Improvement; Physical Health; Housing; Financial Resources/Insurance   Sleep  Sleep: Sleep: Good   Physical Exam: Physical Exam Vitals and nursing note reviewed.  Constitutional:      Appearance: Normal appearance. He is normal weight.  Skin:    General: Skin is warm.     Capillary Refill: Capillary refill takes less than 2 seconds.  Neurological:     General: No focal deficit present.     Mental Status: He is alert and oriented to person, place, and time. Mental status is at baseline.  Psychiatric:        Mood and Affect: Mood normal.        Behavior: Behavior normal.        Thought Content: Thought content normal.        Judgment: Judgment normal.    Review of Systems  Psychiatric/Behavioral:  Positive for substance abuse (alcohol use). Negative for depression, hallucinations, memory loss and suicidal ideas. The patient is nervous/anxious and has insomnia.   All other systems reviewed and are negative.  Blood pressure (!) 134/91, pulse 62, temperature 97.8 F (36.6 C), temperature source Oral, resp. rate 16, height  (1.778 m), weight 92.6 kg, SpO2 97 %. Body mass index is 29.28 kg/m.  Treatment Plan Summary: Daily contact with patient to assess and evaluate symptoms and progress in treatment and Medication management  Will continue phenobarbital taper  Will also order alcohol related labs to include B12, and folic acid; all of which are normal.  Be 1/thiamine still pending. -Will continue low-dose agitation protocol for alcohol to include Haldol and Cogentin if needed. -Continue alcohol/CIWA  detox protocol at this time. Historically this has been effective for patient. Labs reviewed on admission include blood alcohol level of 366, urine drug screen positive for benzodiazepines; EKG reviewed QTc of 427 (with correction Federica). -Will DC safety sitter at this time. Psychiatric consult service will continue to follow, above findings have been discussed with hospitalist Dr. Nelson Chimes.  Disposition: No evidence of imminent risk to self or others at present.   Patient does not meet criteria for psychiatric inpatient admission. Supportive therapy provided about ongoing stressors. Refer to IOP.  Maryagnes Amos, FNP 04/24/2022 6:13 PM

## 2022-04-24 NOTE — TOC Initial Note (Signed)
Transition of Care [facility]) - Initial/Assessment Note    Patient Details  Name: Jermaine [name] MRN: [id number] Date of Birth: 08/18/1965  Transition of Care [facility]) CM/SW Contact:    Golda Acre, RN Phone Number: 04/24/2022, 9:46 AM  Clinical Narrative:                 Alcohol resources added to the AVS if patient is not ivc's or admitted to bh.  Expected Discharge Plan: Home/Self Care Barriers to Discharge: Continued Medical Work up   Patient Goals and CMS Choice Patient states their goals for this hospitalization and ongoing recovery are:: i want to go home      Expected Discharge Plan and Services Expected Discharge Plan: Home/Self Care   Discharge Planning Services: CM Consult   Living arrangements for the past 2 months: Single Family Home                                      Prior Living Arrangements/Services Living arrangements for the past 2 months: Single Family Home Lives with:: Self Patient language and need for interpreter reviewed:: Yes Do you feel safe going back to the place where you live?: Yes            Criminal Activity/Legal Involvement Pertinent to Current Situation/Hospitalization: No - Comment as needed  Activities of Daily Living Home Assistive Devices/Equipment: None ADL Screening (condition at time of admission) Patient's cognitive ability adequate to safely complete daily activities?: Yes Is the patient deaf or have difficulty hearing?: No Does the patient have difficulty seeing, even when wearing glasses/contacts?: No Does the patient have difficulty concentrating, remembering, or making decisions?: No Patient able to express need for assistance with ADLs?: No Does the patient have difficulty dressing or bathing?: No Independently performs ADLs?: Yes (appropriate for developmental age) Does the patient have difficulty walking or climbing stairs?: No Weakness of Legs: None Weakness of Arms/Hands: None  Permission  Sought/Granted                  Emotional Assessment Appearance:: Appears stated age Attitude/Demeanor/Rapport: Engaged Affect (typically observed): Calm Orientation: : Oriented to Self, Oriented to Place, Oriented to  Time, Oriented to Situation Alcohol / Substance Use: Alcohol Use Psych Involvement: Yes (comment)  Admission diagnosis:  Alcohol withdrawal delirium (HCC) [F10.931] Alcohol withdrawal (HCC) [F10.939] Hypoxemia [R09.02] Suicidal ideation [R45.851] Diverticulitis [K57.92] Alcohol intoxication in active alcoholic with complication (HCC) [F10.229] Patient Active Problem List   Diagnosis Date Noted   Suicide ideation 04/22/2022   Alcohol withdrawal delirium (HCC) 04/22/2022   Diverticulitis 04/22/2022   Acute respiratory failure with hypoxia (HCC) 04/22/2022   Dehydration 04/22/2022   Hypocalcemia 09/14/2021   Leukocytosis 09/14/2021   High anion gap metabolic acidosis 09/14/2021   Class 1 obesity 09/14/2021   Secondary polycythemia 09/14/2021   Hypokalemia 07/02/2021   Hypomagnesemia 07/02/2021   Nausea & vomiting 07/01/2021   Sinus tachycardia 07/01/2021   Anxiety and depression 11/20/2018   Alcohol withdrawal (HCC) 11/19/2018   Alcoholic cirrhosis of liver without ascites (HCC) 11/19/2018   Alcohol abuse with intoxication (HCC) 11/16/2018   Chronic alcohol abuse 11/16/2018   Elevated liver enzymes 11/16/2018   Thrombocytopenia (HCC) 11/16/2018   Acute alcohol intoxication with alcoholism (HCC) 11/16/2018   PCP:  Farris Has, MD Pharmacy:   [phone] [place], [place] - [address] [place] [address] Phone: [phone]  Fax: [phone]     Social Determinants of Health (SDOH) Interventions    Readmission Risk Interventions   No data to display

## 2022-04-24 NOTE — Progress Notes (Signed)
Initial Nutrition Assessment  DOCUMENTATION CODES:   Not applicable  INTERVENTION:  - Continue Soft diet as medically appropriate per MD. Advance to Regular as able.  - Encourage intake as tolerated.  - Continue thiamine due to history of alcohol abuse. Will order daily multivitamin as well.   NUTRITION DIAGNOSIS:   Inadequate oral intake related to acute illness as evidenced by other (comment) (clear liquid diet x2 days, new diverticulitis).  GOAL:   Patient will meet greater than or equal to 90% of their needs  MONITOR:   PO intake, Diet advancement, Weight trends  REASON FOR ASSESSMENT:   Consult Assessment of nutrition requirement/status  ASSESSMENT:   56 y.o. male with medical history significant of alcohol abuse who presented with alcohol intoxication and possible suicide attempt. He also reported abdominal pain and diarrhea and CT of the abdomen pelvis showed concerns of mild diverticulitis.   11/26 clear liquid diet 11/28 soft diet  Patient sleeping at time of visit, did not awaken upon entering room.  Per chart review, patient has had a possible 19# or 8.5% weight loss in the past 7 months, which is not significant.   Sitter at bedside reports patient ate very little at breakfast, but notes it was ordered before diet was advanced to soft. He had not yet ordered a lunch yet but told sitter he would order one once awake from his nap. Pt is documented to have had 100% of this lunch this afternoon.  Medications reviewed and include: K+ replacement, Thiamine  Labs reviewed:  -   NUTRITION - FOCUSED PHYSICAL EXAM:  Did not perform, will attempt at later date  Diet Order:   Diet Order             DIET SOFT Room service appropriate? Yes; Fluid consistency: Thin  Diet effective now                   EDUCATION NEEDS:  No education needs have been identified at this time  Skin:  Skin Assessment: Reviewed RN Assessment  Last BM:  11/27  Height:  Ht  Readings from Last 1 Encounters:  04/23/22 5\' 10"  (1.778 m)   Weight:  Wt Readings from Last 1 Encounters:  04/23/22 92.6 kg   Ideal Body Weight:  75.5 kg  BMI:  Body mass index is 29.28 kg/m.  Estimated Nutritional Needs:  Kcal:  2300-2500 kcals Protein:  90-110 grams Fluid:  >/= 2.3L    04/25/22 RD, LDN For contact information, refer to [facility].

## 2022-04-24 NOTE — Progress Notes (Signed)
PROGRESS NOTE    Jermaine [name]  OEV:[id number] DOB: 05/30/1965 DOA: 04/22/2022 PCP: Farris Has, MD   Brief Narrative:  56 year old with history of alcohol abuse comes to the hospital with alcohol intoxication and possible suicide attempt.  He also reported abdominal pain and diarrhea.  CT of the abdomen pelvis showed concerns of mild diverticulitis and started on alcohol withdrawal protocol.  Patient was seen by psychiatry team who recommended one-to-one observation for now.  Patient started on phenobarbital taper.   Assessment & Plan:  Principal Problem:   Acute alcohol intoxication with alcoholism (HCC) Active Problems:   Alcohol withdrawal (HCC)   Suicide ideation   Alcohol withdrawal delirium (HCC)   Diverticulitis   Acute respiratory failure with hypoxia (HCC)   Dehydration    Proximal sigmoid mild diverticulitis, uncomplicated - Will advance to soft diet.  IV fluids, empiric IV Rocephin and Flagyl.  Eventually will need outpatient colonoscopy next 8 weeks or so.  Alcohol intoxication Possible suicide attempt - On alcohol withdrawal protocol.  Seen by psychiatry.  Alcohol withdrawal signs have improved.  Maintain one-to-one sitter, on phenobarbital taper.  Low threshold to start Precedex drip if necessary -Supportive care.  Mild rhabdomyolysis - Initial CK4 25, improved with IV fluids  Chronic thrombocytopenia/transaminitis - Secondary to alcohol use.  CT scan showing hepatic steatosis.     DVT prophylaxis: SCDs Start: 04/22/22 2242 Code Status: Full Family Communication:    Continue hospital stay to complete phenobarbital taper, in the meantime psychiatry seeing him for suicidal ideations.  Subjective: Appears to be much calmer this morning, less tremulous.  Patient has been compliant with his medication.  Denies any suicidal ideations.   Examination:  Constitutional: Not in acute distress Respiratory: Clear to auscultation bilaterally Cardiovascular:  Normal sinus rhythm, no rubs Abdomen: Nontender nondistended good bowel sounds Musculoskeletal: No edema noted Skin: No rashes seen Neurologic: CN 2-12 grossly intact.  And nonfocal Psychiatric: Normal judgment and insight. Alert and oriented x 3. Normal mood.  Objective: Vitals:   04/23/22 2310 04/24/22 0330 04/24/22 0751 04/24/22 1359  BP: (!) 144/99 (!) 139/91 (!) 149/100 (!) 134/91  Pulse: 77 60 66 62  Resp: 16 16 16 16   Temp: 98.7 F (37.1 C) 98 F (36.7 C) 97.7 F (36.5 C) 97.8 F (36.6 C)  TempSrc: Oral Oral Oral Oral  SpO2: 96% 97% 98% 97%  Weight:      Height:        Intake/Output Summary (Last 24 hours) at 04/24/2022 1408 Last data filed at 04/24/2022 1025 Gross per 24 hour  Intake 2164.7 ml  Output --  Net 2164.7 ml   Filed Weights   04/23/22 1939  Weight: 92.6 kg     Data Reviewed:   CBC: Recent Labs  Lab 04/22/22 1527 04/23/22 0500 04/24/22 0402  WBC 6.5 5.6 5.1  NEUTROABS 4.2  --   --   HGB 16.9 14.3 13.8  HCT 50.2 43.8 42.1  MCV 89.5 91.1 91.7  PLT 155 111* 107*   Basic Metabolic Panel: Recent Labs  Lab 04/22/22 1527 04/22/22 1539 04/22/22 2125 04/23/22 0500 04/24/22 0402  NA 138  --   --  139 135  K 3.9  --   --  4.0 3.5  CL 96*  --   --  103 98  CO2 25  --   --  24 27  GLUCOSE 99  --   --  73 83  BUN 16  --   --  16 12  CREATININE 1.01  --   --  0.72 0.72  CALCIUM 8.8*  --   --  7.7* 7.6*  MG  --  2.2  --  2.1 2.1  PHOS  --   --  3.5 3.2  --    GFR: Estimated Creatinine Clearance: 117.8 mL/min (by C-G formula based on SCr of 0.72 mg/dL). Liver Function Tests: Recent Labs  Lab 04/22/22 1527 04/23/22 0500 04/24/22 0402  AST 113* 97* 86*  ALT 75* 67* 67*  ALKPHOS 49 36* 37*  BILITOT 1.2 0.8 1.0  PROT 8.1 6.8 6.6  ALBUMIN 5.0 4.1 3.9   No results for input(s): "LIPASE", "AMYLASE" in the last 168 hours. No results for input(s): "AMMONIA" in the last 168 hours. Coagulation Profile: Recent Labs  Lab 04/22/22 2125   INR 1.1   Cardiac Enzymes: Recent Labs  Lab 04/22/22 2125 04/23/22 0500  CKTOTAL 425* 349   BNP (last 3 results) No results for input(s): "PROBNP" in the last 8760 hours. HbA1C: No results for input(s): "HGBA1C" in the last 72 hours. CBG: No results for input(s): "GLUCAP" in the last 168 hours. Lipid Profile: No results for input(s): "CHOL", "HDL", "LDLCALC", "TRIG", "CHOLHDL", "LDLDIRECT" in the last 72 hours. Thyroid Function Tests: Recent Labs    04/22/22 2125  TSH 0.544   Anemia Panel: Recent Labs    04/23/22 1805  VITAMINB12 355  FOLATE 8.5   Sepsis Labs: No results for input(s): "PROCALCITON", "LATICACIDVEN" in the last 168 hours.  Recent Results (from the past 240 hour(s))  Resp Panel by RT-PCR (Flu A&B, Covid) Anterior Nasal Swab     Status: None   Collection Time: 04/22/22  9:25 PM   Specimen: Anterior Nasal Swab  Result Value Ref Range Status   SARS Coronavirus 2 by RT PCR NEGATIVE NEGATIVE Final    Comment: (NOTE) SARS-CoV-2 target nucleic acids are NOT DETECTED.  The SARS-CoV-2 RNA is generally detectable in upper respiratory specimens during the acute phase of infection. The lowest concentration of SARS-CoV-2 viral copies this assay can detect is 138 copies/mL. A negative result does not preclude SARS-Cov-2 infection and should not be used as the sole basis for treatment or other patient management decisions. A negative result may occur with  improper specimen collection/handling, submission of specimen other than nasopharyngeal swab, presence of viral mutation(s) within the areas targeted by this assay, and inadequate number of viral copies(<138 copies/mL). A negative result must be combined with clinical observations, patient history, and epidemiological information. The expected result is Negative.  Fact Sheet for Patients:  BloggerCourse.comhttps://www.fda.gov/media/152166/download  Fact Sheet for Healthcare Providers:   SeriousBroker.ithttps://www.fda.gov/media/152162/download  This test is no t yet approved or cleared by the [place] FDA and  has been authorized for detection and/or diagnosis of SARS-CoV-2 by FDA under an Emergency Use Authorization (EUA). This EUA will remain  in effect (meaning this test can be used) for the duration of the COVID-19 declaration under Section 564(b)(1) of the Act, 21 U.S.C.section 360bbb-3(b)(1), unless the authorization is terminated  or revoked sooner.       Influenza A by PCR NEGATIVE NEGATIVE Final   Influenza B by PCR NEGATIVE NEGATIVE Final    Comment: (NOTE) The Xpert Xpress SARS-CoV-2/FLU/RSV plus assay is intended as an aid in the diagnosis of influenza from Nasopharyngeal swab specimens and should not be used as a sole basis for treatment. Nasal washings and aspirates are unacceptable for Xpert Xpress SARS-CoV-2/FLU/RSV testing.  Fact Sheet for Patients: BloggerCourse.comhttps://www.fda.gov/media/152166/download  Fact Sheet for  Healthcare Providers: SeriousBroker.it  This test is not yet approved or cleared by the [place] and has been authorized for detection and/or diagnosis of SARS-CoV-2 by FDA under an Emergency Use Authorization (EUA). This EUA will remain in effect (meaning this test can be used) for the duration of the COVID-19 declaration under Section 564(b)(1) of the Act, 21 U.S.C. section 360bbb-3(b)(1), unless the authorization is terminated or revoked.  Performed at [facility], [address], [place], [place] [address]          Radiology Studies: CT Abdomen Pelvis W Contrast  Result Date: 04/22/2022 CLINICAL DATA:  Abdominal pain. EXAM: CT ABDOMEN AND PELVIS WITH CONTRAST TECHNIQUE: Multidetector CT imaging of the abdomen and pelvis was performed using the standard protocol following bolus administration of intravenous contrast. RADIATION DOSE REDUCTION: This exam was performed according to the  departmental dose-optimization program which includes automated exposure control, adjustment of the mA and/or kV according to patient size and/or use of iterative reconstruction technique. CONTRAST:  OMNIPAQUE IOHEXOL 300 MG/ML  SOLN COMPARISON:  None Available. FINDINGS: Lower chest: No acute abnormality. Hepatobiliary: There is diffuse fatty infiltration of the liver parenchyma. A 7 mm focus of parenchymal low attenuation is seen within the right of the liver (axial CT image 14, CT series 2). The gallbladder is moderately distended. No gallstones, gallbladder wall thickening, or biliary dilatation. Pancreas: Unremarkable. No pancreatic ductal dilatation or surrounding inflammatory changes. Spleen: Normal in size without focal abnormality. Adrenals/Urinary Tract: Adrenal glands are unremarkable. Kidneys are normal, without renal calculi, focal lesion, or hydronephrosis. The urinary bladder is moderately distended and is otherwise unremarkable. Stomach/Bowel: Stomach is within normal limits. Appendix appears normal. No evidence of bowel wall thickening, distention, or inflammatory changes. Numerous diverticula are seen throughout the transverse, descending and sigmoid colon. Mild thickening of the proximal sigmoid colon is also seen. Vascular/Lymphatic: Aortic atherosclerosis. No enlarged abdominal or pelvic lymph nodes. Reproductive: The prostate gland is mildly enlarged. Other: A 2.1 cm x 1.7 cm fat containing scrotal hernia is seen on the right. No abdominopelvic ascites. Musculoskeletal: No acute or significant osseous findings. IMPRESSION: 1. Colonic diverticulosis with mild thickening of the proximal sigmoid colon which may represent mild diverticulitis. 2. Hepatic steatosis. 3. Findings which may represent a small hepatic cyst versus hemangioma. Correlation with nonemergent hepatic ultrasound is recommended. 4. Aortic atherosclerosis. Aortic Atherosclerosis (ICD10-I70.0). Electronically Signed   By:  Aram Candela M.D.   On: 04/22/2022 19:47   DG Chest 2 View  Result Date: 04/22/2022 CLINICAL DATA:  Suicidal EXAM: CHEST - 2 VIEW COMPARISON:  09/22/2021 FINDINGS: The heart size and mediastinal contours are within normal limits. Both lungs are clear. The visualized skeletal structures are unremarkable. IMPRESSION: No acute abnormality of the lungs. Electronically Signed   By: Jearld Lesch M.D.   On: 04/22/2022 15:57        Scheduled Meds:  escitalopram  20 mg Oral Daily   LORazepam  0-4 mg Intravenous Q6H   Or   LORazepam  0-4 mg Oral Q6H   LORazepam  0-4 mg Intravenous Q12H   Or   LORazepam  0-4 mg Oral Q12H   phenobarbital  97.2 mg Oral Q8H   Followed by   Melene Muller ON 04/25/2022] phenobarbital  64.8 mg Oral Q8H   Followed by   Melene Muller ON 04/27/2022] phenobarbital  32.4 mg Oral Q8H   thiamine  100 mg Oral Daily   Or   thiamine  100 mg Intravenous Daily  Continuous Infusions:  cefTRIAXone (ROCEPHIN)  IV Stopped (04/24/22 0640)   metronidazole 500 mg (04/24/22 0840)     LOS: 1 day   Time spent= 35 mins     Joline Maxcy, MD [facility]  If 7PM-7AM, please contact night-coverage  04/24/2022, 2:08 PM

## 2022-04-25 DIAGNOSIS — F10229 Alcohol dependence with intoxication, unspecified: Secondary | ICD-10-CM | POA: Diagnosis not present

## 2022-04-25 LAB — CBC
HCT: 47.5 % (ref 39.0–52.0)
Hemoglobin: 15.4 g/dL (ref 13.0–17.0)
MCH: 29.8 pg (ref 26.0–34.0)
MCHC: 32.4 g/dL (ref 30.0–36.0)
MCV: 92.1 fL (ref 80.0–100.0)
Platelets: 114 10*3/uL — ABNORMAL LOW (ref 150–400)
RBC: 5.16 MIL/uL (ref 4.22–5.81)
RDW: 15.1 % (ref 11.5–15.5)
WBC: 5 10*3/uL (ref 4.0–10.5)
nRBC: 0 % (ref 0.0–0.2)

## 2022-04-25 LAB — COMPREHENSIVE METABOLIC PANEL
ALT: 108 U/L — ABNORMAL HIGH (ref 0–44)
AST: 126 U/L — ABNORMAL HIGH (ref 15–41)
Albumin: 3.8 g/dL (ref 3.5–5.0)
Alkaline Phosphatase: 47 U/L (ref 38–126)
Anion gap: 8 (ref 5–15)
BUN: 16 mg/dL (ref 6–20)
CO2: 26 mmol/L (ref 22–32)
Calcium: 8.9 mg/dL (ref 8.9–10.3)
Chloride: 101 mmol/L (ref 98–111)
Creatinine, Ser: 0.98 mg/dL (ref 0.61–1.24)
GFR, Estimated: >60 mL/min (ref >60)
Glucose, Bld: 104 mg/dL — ABNORMAL HIGH (ref 70–99)
Potassium: 4.1 mmol/L (ref 3.5–5.1)
Sodium: 135 mmol/L (ref 135–145)
Total Bilirubin: 0.8 mg/dL (ref 0.3–1.2)
Total Protein: 6.7 g/dL (ref 6.5–8.1)

## 2022-04-25 LAB — VITAMIN B1: Vitamin B1 (Thiamine): 192.4 nmol/L (ref 66.5–200.0)

## 2022-04-25 LAB — MAGNESIUM: Magnesium: 2.2 mg/dL (ref 1.7–2.4)

## 2022-04-25 NOTE — Hospital Course (Addendum)
PMH of alcohol abuse presented to the hospital with alcohol intoxication and possible suicidal attempt.  Psychiatry was consulted.  Currently patient is cleared by psychiatry to go home.  Started on phenobarbital taper.  Currently improving.  Incidentally found to have sigmoid diverticulitis.

## 2022-04-25 NOTE — Progress Notes (Addendum)
2000 patient alert able to make all needs known  2200 all meds given as ordered

## 2022-04-25 NOTE — Progress Notes (Signed)
Mobility Specialist - Progress Note   04/25/22 1210  Mobility  Activity Ambulated with assistance in hallway  Level of Assistance Contact guard assist, steadying assist  Assistive Device None  Distance Ambulated (ft) 350 ft  Activity Response Tolerated well  Mobility Referral Yes  $Mobility charge 1 Mobility   Pt received in bed and agreeable to mobility. Pt had two instances of LOB during ambulation. No complaints during mobility.  Pt to EOB after session with all needs met.     [facility]

## 2022-04-25 NOTE — Progress Notes (Signed)
[facility] Progress Note Patient: Jermaine [name] PJA:[id number] DOB: Aug 02, 1965 DOA: 04/22/2022  DOS: the patient was seen and examined on 04/25/2022  Brief hospital course: PMH of alcohol abuse presented to the hospital with alcohol intoxication and possible suicidal attempt.  Psychiatry was consulted.  Currently patient is cleared by psychiatry to go home.  Started on phenobarbital taper.  Currently improving.  Incidentally found to have sigmoid diverticulitis. Assessment and Plan: Acute sigmoid diverticulitis. Currently on IV antibiotics. Patient will require colonoscopy outpatient. Tolerating soft diet.  Suicide attempt. Psychiatry was consulted. Recommend conservative management. Does not meet any criteria for inpatient psych admission. Monitor.  Alcohol intoxication and alcohol abuse. Patient currently willing to quit alcohol. On phenobarbital taper. May benefit from outpatient medical therapy.  Mild rhabdomyolysis. CK was elevated currently improving. Monitor.  Chronic thrombocytopenia. Chronic elevated LFT. In the setting of alcohol use. Patient recommended to stop drinking alcohol to improve these parameters.   Subjective: No nausea no vomiting no fever no chills.  No abdominal pain.  Physical Exam: General: in mild distress; some tremors noted. Cardiovascular: S1 and S2 Present, no Murmur Respiratory: Normal respiratory effort, Bilateral Air entry present, no Crackles, no wheezes Abdomen: Bowel Sound present, Non tender  Extremities: no edema Neurology: alert and oriented to time, place, and person   Data Reviewed: I have Reviewed nursing notes, Vitals, and Lab results. Since last encounter, pertinent lab results CBC and BMP   .   Disposition: Status is: Inpatient Remains inpatient appropriate because: Monitor for diet tolerance and improvement in CIWA score.  SCDs Start: 04/22/22 2242   Family Communication: No one at bedside Level of care:  Progressive  Vitals:   04/24/22 1359 04/24/22 2100 04/25/22 0528 04/25/22 1234  BP: (!) 134/91 (!) 162/92 (!) 127/99 116/74  Pulse: 62 86 63 98  Resp: 16 16 18 18   Temp: 97.8 F (36.6 C) 97.8 F (36.6 C) 97.7 F (36.5 C) 98 F (36.7 C)  TempSrc: Oral Oral Oral Oral  SpO2: 97% 99% 100% 96%  Weight:      Height:         Author: , MD 04/25/2022 6:49 PM  Please look on [email or web address] to find out who is on call.

## 2022-04-25 NOTE — Plan of Care (Signed)

## 2022-04-25 NOTE — Consult Note (Signed)
  Attempted to assess patient, however he was observed to be sleeping.  Patient difficult to arouse despite loud name calling.  Psychiatry consult service will continue to follow, will attempt to reassess tomorrow and probable on motivational interviewing, and speak with sponsor for collateral.

## 2022-04-25 NOTE — Progress Notes (Signed)
Brief Nutrition Note  Met with patient today at bedside as patient sleeping during visits yesterday.  Pt reports UBW of 220# and a 20# intentional weight loss over the past couple months since starting a keto diet and doing intermittent fasting. Per EMR pt weighed at 223# in April and now weighed at 203# - a 20# or 9% weight loss within the past 7 months.  Patient reports he typically eats 1 meal/day which usually consists of a burger with broccoli. However, pt reports that over the past 2 weeks when he started drinking again he would pick up several cases of beer and have 2 sandwiches a day.  His current appetite is good and he is noted to have eaten 100% of his lunch today.  NFPE performed and no to minimal mild wasting noted. No nutritional concerns. Please refer to full assessment note yesterday for recommendations.   RD will sign off at this time. Please reconsult if nutrition issues arise.  Jermaine [name] RD, LDN For contact information, refer to [facility].

## 2022-04-26 DIAGNOSIS — F10229 Alcohol dependence with intoxication, unspecified: Secondary | ICD-10-CM | POA: Diagnosis not present

## 2022-04-26 MED ORDER — METRONIDAZOLE 500 MG PO TABS: 500.0000 mg | ORAL_TABLET | Freq: Three times a day (TID) | ORAL | 0 refills | Status: AC

## 2022-04-26 MED ORDER — CEFDINIR 300 MG PO CAPS: 300.0000 mg | ORAL_CAPSULE | Freq: Two times a day (BID) | ORAL | 0 refills | Status: AC

## 2022-04-26 MED ORDER — VITAMIN B-12 1000 MCG PO TABS: 1000.0000 ug | ORAL_TABLET | Freq: Every day | ORAL | 0 refills | Status: DC

## 2022-04-26 MED ORDER — GABAPENTIN 100 MG PO CAPS: 100.0000 mg | ORAL_CAPSULE | Freq: Every day | ORAL | 0 refills | Status: DC

## 2022-04-26 MED ORDER — VITAMIN B-1 100 MG PO TABS: 100.0000 mg | ORAL_TABLET | Freq: Every day | ORAL | 0 refills | Status: DC

## 2022-04-26 NOTE — TOC Transition Note (Signed)
Transition of Care [facility]) - CM/SW Discharge Note   Patient Details  Name: Jermaine [name] MRN: [id number] Date of Birth: 07/11/1965  [facility] Care [facility]) CM/SW Contact:  Golda Acre, RN Phone Number: 04/26/2022, 1:04 PM   Clinical Narrative:    Patient discharged to return home with self care.  Dc instructions for etoh and drug substance abuse resources to patient.   Final next level of care: Home/Self Care Barriers to Discharge: Barriers Resolved   Patient Goals and CMS Choice Patient states their goals for this hospitalization and ongoing recovery are:: i want to go home      Discharge Placement                       Discharge Plan and Services   Discharge Planning Services: CM Consult                                 Social Determinants of Health (SDOH) Interventions     Readmission Risk Interventions   No data to display

## 2022-04-28 NOTE — Discharge Summary (Signed)
Physician Discharge Summary   Patient: Jermaine [name] MRN: [id number] DOB: 1965-10-19  Admit date:     04/22/2022  Discharge date: 04/26/2022  Discharge Physician: Berle Mull  PCP: London Pepper, MD  Recommendations at discharge: Follow-up with PCP. Follow-up with GI for colonoscopy   Follow-up Information     London Pepper, MD. Schedule an appointment as soon as possible for a visit in 1 week(s).   Specialty: Family Medicine Contact information: [address] [place] [address] [phone]         Gastroenterology, [facility]. Schedule an appointment as soon as possible for a visit in 2 month(s).   Why: Need colonoscopy after episode of diverticulitis Contact information: [place] [address] [phone]                Discharge Diagnoses: Principal Problem:   Acute alcohol intoxication with alcoholism ([place]) Active Problems:   Alcohol withdrawal ([place])   Suicide ideation   Alcohol withdrawal delirium ([place])   Diverticulitis   Acute respiratory failure with hypoxia ([place])   Dehydration  Hospital Course: PMH of alcohol abuse presented to the hospital with alcohol intoxication and possible suicidal attempt.  Psychiatry was consulted.  Currently patient is cleared by psychiatry to go home.  Started on phenobarbital taper.  Currently improving.  Incidentally found to have sigmoid diverticulitis. Assessment and Plan  Acute sigmoid diverticulitis. Treated with IV antibiotics.  Currently tolerating oral diet.  Transition to p.o. Patient will require colonoscopy outpatient.   Suicide intention.  Ruled out. Psychiatry was consulted.  Patient did not have any suicidal ideation at the time of my evaluation.  Patient has not attempted suicide. Recommend conservative management.  Per psychiatry Does not meet any criteria for inpatient psych admission. Monitor.   Alcohol intoxication and alcohol abuse. Patient currently  willing to quit alcohol. On phenobarbital taper. Initiate gabapentin on discharge.   Mild rhabdomyolysis. CK was elevated currently improving. Monitor.   Chronic thrombocytopenia. Chronic elevated LFT. In the setting of alcohol use. Patient recommended to stop drinking alcohol to improve these parameters.   Consultants:  none  Procedures performed:  none  DISCHARGE MEDICATION: Allergies as of 04/26/2022   No Known Allergies      Medication List     STOP taking these medications    ondansetron 4 MG tablet Commonly known as: ZOFRAN   potassium chloride SA 20 MEQ tablet Commonly known as: KLOR-CON M   thiamine 100 MG tablet Commonly known as: VITAMIN B1       TAKE these medications    cefdinir 300 MG capsule Commonly known as: OMNICEF Take 1 capsule (300 mg total) by mouth 2 (two) times daily for 3 days.   cyanocobalamin 1000 MCG tablet Commonly known as: VITAMIN B12 Take 1 tablet (1,000 mcg total) by mouth daily. What changed: how much to take   escitalopram 20 MG tablet Commonly known as: LEXAPRO Take 20 mg by mouth daily.   folic acid 1 MG tablet Commonly known as: FOLVITE Take 1 tablet (1 mg total) by mouth daily.   gabapentin 100 MG capsule Commonly known as: Neurontin Take 1 capsule (100 mg total) by mouth daily.   metroNIDAZOLE 500 MG tablet Commonly known as: Flagyl Take 1 tablet (500 mg total) by mouth 3 (three) times daily for 3 days.   multivitamin with minerals Tabs tablet Take 1 tablet by mouth daily.   thiamine 100 MG tablet Commonly known as: Vitamin B-1 Take 1  tablet (100 mg total) by mouth daily.   vitamin C 1000 MG tablet Take 2,000 mg by mouth daily.   Vitamin D3 50 MCG (2000 UT) capsule Take 2,000 Units by mouth daily.       Disposition: Home Diet recommendation: Soft diet  Discharge Exam: Vitals:   04/25/22 1234 04/25/22 2215 04/26/22 0237 04/26/22 0434  BP: 116/74 123/83 91/67 108/78  Pulse: 98 80 83 77   Resp: 18 20 19 16   Temp: 98 F (36.7 C) 98.1 F (36.7 C) 97.8 F (36.6 C) 97.7 F (36.5 C)  TempSrc: Oral Oral Axillary Oral  SpO2: 96% 99% 97% 97%  Weight:      Height:       General: Appear in no distress; no visible Abnormal Neck Mass Or lumps, Conjunctiva normal Cardiovascular: S1 and S2 Present, no Murmur, Respiratory: good respiratory effort, Bilateral Air entry present and CTA, no Crackles, no wheezes Abdomen: Bowel Sound present, Non tender  Extremities: no Pedal edema Neurology: alert and oriented to time, place, and person  [facility] Weights   04/23/22 1939  Weight: 92.6 kg   Condition at discharge: stable  The results of significant diagnostics from this hospitalization (including imaging, microbiology, ancillary and laboratory) are listed below for reference.   Imaging Studies: CT Abdomen Pelvis W Contrast  Result Date: 04/22/2022 CLINICAL DATA:  Abdominal pain. EXAM: CT ABDOMEN AND PELVIS WITH CONTRAST TECHNIQUE: Multidetector CT imaging of the abdomen and pelvis was performed using the standard protocol following bolus administration of intravenous contrast. RADIATION DOSE REDUCTION: This exam was performed according to the departmental dose-optimization program which includes automated exposure control, adjustment of the mA and/or kV according to patient size and/or use of iterative reconstruction technique. CONTRAST:  164mL OMNIPAQUE IOHEXOL 300 MG/ML  SOLN COMPARISON:  None Available. FINDINGS: Lower chest: No acute abnormality. Hepatobiliary: There is diffuse fatty infiltration of the liver parenchyma. A 7 mm focus of parenchymal low attenuation is seen within the right of the liver (axial CT image 14, CT series 2). The gallbladder is moderately distended. No gallstones, gallbladder wall thickening, or biliary dilatation. Pancreas: Unremarkable. No pancreatic ductal dilatation or surrounding inflammatory changes. Spleen: Normal in size without focal abnormality.  Adrenals/Urinary Tract: Adrenal glands are unremarkable. Kidneys are normal, without renal calculi, focal lesion, or hydronephrosis. The urinary bladder is moderately distended and is otherwise unremarkable. Stomach/Bowel: Stomach is within normal limits. Appendix appears normal. No evidence of bowel wall thickening, distention, or inflammatory changes. Numerous diverticula are seen throughout the transverse, descending and sigmoid colon. Mild thickening of the proximal sigmoid colon is also seen. Vascular/Lymphatic: Aortic atherosclerosis. No enlarged abdominal or pelvic lymph nodes. Reproductive: The prostate gland is mildly enlarged. Other: A 2.1 cm x 1.7 cm fat containing scrotal hernia is seen on the right. No abdominopelvic ascites. Musculoskeletal: No acute or significant osseous findings. IMPRESSION: 1. Colonic diverticulosis with mild thickening of the proximal sigmoid colon which may represent mild diverticulitis. 2. Hepatic steatosis. 3. Findings which may represent a small hepatic cyst versus hemangioma. Correlation with nonemergent hepatic ultrasound is recommended. 4. Aortic atherosclerosis. Aortic Atherosclerosis (ICD10-I70.0). Electronically Signed   By: Virgina Norfolk M.D.   On: 04/22/2022 19:47   DG Chest 2 View  Result Date: 04/22/2022 CLINICAL DATA:  Suicidal EXAM: CHEST - 2 VIEW COMPARISON:  09/22/2021 FINDINGS: The heart size and mediastinal contours are within normal limits. Both lungs are clear. The visualized skeletal structures are unremarkable. IMPRESSION: No acute abnormality of the lungs. Electronically Signed  By: Jearld Lesch M.D.   On: 04/22/2022 15:57    Microbiology: Results for orders placed or performed during the hospital encounter of 04/22/22  Resp Panel by RT-PCR (Flu A&B, Covid) Anterior Nasal Swab     Status: None   Collection Time: 04/22/22  9:25 PM   Specimen: Anterior Nasal Swab  Result Value Ref Range Status   SARS Coronavirus 2 by RT PCR NEGATIVE  NEGATIVE Final    Comment: (NOTE) SARS-CoV-2 target nucleic acids are NOT DETECTED.  The SARS-CoV-2 RNA is generally detectable in upper respiratory specimens during the acute phase of infection. The lowest concentration of SARS-CoV-2 viral copies this assay can detect is 138 copies/mL. A negative result does not preclude SARS-Cov-2 infection and should not be used as the sole basis for treatment or other patient management decisions. A negative result may occur with  improper specimen collection/handling, submission of specimen other than nasopharyngeal swab, presence of viral mutation(s) within the areas targeted by this assay, and inadequate number of viral copies(<138 copies/mL). A negative result must be combined with clinical observations, patient history, and epidemiological information. The expected result is Negative.  Fact Sheet for Patients:  BloggerCourse.com  Fact Sheet for Healthcare Providers:  SeriousBroker.it  This test is no t yet approved or cleared by the [place] FDA and  has been authorized for detection and/or diagnosis of SARS-CoV-2 by FDA under an Emergency Use Authorization (EUA). This EUA will remain  in effect (meaning this test can be used) for the duration of the COVID-19 declaration under Section 564(b)(1) of the Act, 21 U.S.C.section 360bbb-3(b)(1), unless the authorization is terminated  or revoked sooner.       Influenza A by PCR NEGATIVE NEGATIVE Final   Influenza B by PCR NEGATIVE NEGATIVE Final    Comment: (NOTE) The Xpert Xpress SARS-CoV-2/FLU/RSV plus assay is intended as an aid in the diagnosis of influenza from Nasopharyngeal swab specimens and should not be used as a sole basis for treatment. Nasal washings and aspirates are unacceptable for Xpert Xpress SARS-CoV-2/FLU/RSV testing.  Fact Sheet for Patients: BloggerCourse.com  Fact Sheet for Healthcare  Providers: SeriousBroker.it  This test is not yet approved or cleared by the [place] FDA and has been authorized for detection and/or diagnosis of SARS-CoV-2 by FDA under an Emergency Use Authorization (EUA). This EUA will remain in effect (meaning this test can be used) for the duration of the COVID-19 declaration under Section 564(b)(1) of the Act, 21 U.S.C. section 360bbb-3(b)(1), unless the authorization is terminated or revoked.  Performed at [facility], [address] [place], [place] [address]    Labs: CBC: Recent Labs  Lab 04/22/22 1527 04/23/22 0500 04/24/22 0402 04/25/22 0432  WBC 6.5 5.6 5.1 5.0  NEUTROABS 4.2  --   --   --   HGB 16.9 14.3 13.8 15.4  HCT 50.2 43.8 42.1 47.5  MCV 89.5 91.1 91.7 92.1  PLT 155 111* 107* 114*   Basic Metabolic Panel: Recent Labs  Lab 04/22/22 1527 04/22/22 1539 04/22/22 2125 04/23/22 0500 04/24/22 0402 04/25/22 0432  NA 138  --   --  139 135 135  K 3.9  --   --  4.0 3.5 4.1  CL 96*  --   --  103 98 101  CO2 25  --   --  24 27 26   GLUCOSE 99  --   --  73 83 104*  BUN 16  --   --  16 12 16  CREATININE 1.01  --   --  0.72 0.72 0.98  CALCIUM 8.8*  --   --  7.7* 7.6* 8.9  MG  --  2.2  --  2.1 2.1 2.2  PHOS  --   --  3.5 3.2  --   --    Liver Function Tests: Recent Labs  Lab 04/22/22 1527 04/23/22 0500 04/24/22 0402 04/25/22 0432  AST 113* 97* 86* 126*  ALT 75* 67* 67* 108*  ALKPHOS 49 36* 37* 47  BILITOT 1.2 0.8 1.0 0.8  PROT 8.1 6.8 6.6 6.7  ALBUMIN 5.0 4.1 3.9 3.8   CBG: No results for input(s): "GLUCAP" in the last 168 hours.  Discharge time spent: greater than 30 minutes.  Signed: Berle Mull, MD [facility] 04/26/2022

## 2022-05-12 ENCOUNTER — Emergency Department (HOSPITAL_COMMUNITY): Payer: BC Managed Care – PPO

## 2022-05-12 ENCOUNTER — Inpatient Hospital Stay (HOSPITAL_COMMUNITY)
Admission: EM | Admit: 2022-05-12 | Discharge: 2022-05-22 | DRG: 897 | Disposition: A | Discharge status: Home / Self Care | Payer: BC Managed Care – PPO | Attending: Internal Medicine | Admitting: Internal Medicine

## 2022-05-12 ENCOUNTER — Encounter (HOSPITAL_COMMUNITY): Payer: Self-pay

## 2022-05-12 DIAGNOSIS — Z79899 Other long term (current) drug therapy: Secondary | ICD-10-CM

## 2022-05-12 DIAGNOSIS — F10931 Alcohol use, unspecified with withdrawal delirium: Principal | ICD-10-CM

## 2022-05-12 DIAGNOSIS — F329 Major depressive disorder, single episode, unspecified: Secondary | ICD-10-CM | POA: Diagnosis present

## 2022-05-12 DIAGNOSIS — E876 Hypokalemia: Secondary | ICD-10-CM | POA: Diagnosis not present

## 2022-05-12 DIAGNOSIS — Z1152 Encounter for screening for COVID-19: Secondary | ICD-10-CM

## 2022-05-12 DIAGNOSIS — F10231 Alcohol dependence with withdrawal delirium: Secondary | ICD-10-CM | POA: Diagnosis present

## 2022-05-12 DIAGNOSIS — R748 Abnormal levels of other serum enzymes: Secondary | ICD-10-CM | POA: Diagnosis not present

## 2022-05-12 DIAGNOSIS — K7010 Alcoholic hepatitis without ascites: Secondary | ICD-10-CM | POA: Diagnosis present

## 2022-05-12 DIAGNOSIS — Y908 Blood alcohol level of 240 mg/100 ml or more: Secondary | ICD-10-CM | POA: Diagnosis present

## 2022-05-12 DIAGNOSIS — F32A Depression, unspecified: Secondary | ICD-10-CM | POA: Diagnosis present

## 2022-05-12 DIAGNOSIS — E871 Hypo-osmolality and hyponatremia: Secondary | ICD-10-CM | POA: Diagnosis not present

## 2022-05-12 DIAGNOSIS — F10131 Alcohol abuse with withdrawal delirium: Secondary | ICD-10-CM | POA: Diagnosis not present

## 2022-05-12 DIAGNOSIS — F419 Anxiety disorder, unspecified: Secondary | ICD-10-CM | POA: Diagnosis present

## 2022-05-12 DIAGNOSIS — Z046 Encounter for general psychiatric examination, requested by authority: Secondary | ICD-10-CM | POA: Diagnosis not present

## 2022-05-12 DIAGNOSIS — D696 Thrombocytopenia, unspecified: Secondary | ICD-10-CM | POA: Diagnosis not present

## 2022-05-12 DIAGNOSIS — F1010 Alcohol abuse, uncomplicated: Secondary | ICD-10-CM | POA: Diagnosis not present

## 2022-05-12 DIAGNOSIS — K760 Fatty (change of) liver, not elsewhere classified: Secondary | ICD-10-CM | POA: Diagnosis present

## 2022-05-12 DIAGNOSIS — K7030 Alcoholic cirrhosis of liver without ascites: Secondary | ICD-10-CM | POA: Diagnosis present

## 2022-05-12 DIAGNOSIS — R45851 Suicidal ideations: Secondary | ICD-10-CM | POA: Diagnosis not present

## 2022-05-12 DIAGNOSIS — F10221 Alcohol dependence with intoxication delirium: Secondary | ICD-10-CM | POA: Diagnosis present

## 2022-05-12 DIAGNOSIS — F10930 Alcohol use, unspecified with withdrawal, uncomplicated: Principal | ICD-10-CM

## 2022-05-12 DIAGNOSIS — R251 Tremor, unspecified: Secondary | ICD-10-CM | POA: Diagnosis present

## 2022-05-12 DIAGNOSIS — D6959 Other secondary thrombocytopenia: Secondary | ICD-10-CM | POA: Diagnosis not present

## 2022-05-12 LAB — CBC
HCT: 52.2 % — ABNORMAL HIGH (ref 39.0–52.0)
Hemoglobin: 17.7 g/dL — ABNORMAL HIGH (ref 13.0–17.0)
MCH: 30.4 pg (ref 26.0–34.0)
MCHC: 33.9 g/dL (ref 30.0–36.0)
MCV: 89.7 fL (ref 80.0–100.0)
Platelets: 150 10*3/uL (ref 150–400)
RBC: 5.82 MIL/uL — ABNORMAL HIGH (ref 4.22–5.81)
RDW: 16.3 % — ABNORMAL HIGH (ref 11.5–15.5)
WBC: 9.9 10*3/uL (ref 4.0–10.5)
nRBC: 0 % (ref 0.0–0.2)

## 2022-05-12 LAB — COMPREHENSIVE METABOLIC PANEL
ALT: 404 U/L — ABNORMAL HIGH (ref 0–44)
AST: 545 U/L — ABNORMAL HIGH (ref 15–41)
Albumin: 5.4 g/dL — ABNORMAL HIGH (ref 3.5–5.0)
Alkaline Phosphatase: 58 U/L (ref 38–126)
Anion gap: 29 — ABNORMAL HIGH (ref 5–15)
BUN: 15 mg/dL (ref 6–20)
CO2: 20 mmol/L — ABNORMAL LOW (ref 22–32)
Calcium: 8.8 mg/dL — ABNORMAL LOW (ref 8.9–10.3)
Chloride: 87 mmol/L — ABNORMAL LOW (ref 98–111)
Creatinine, Ser: 1.1 mg/dL (ref 0.61–1.24)
GFR, Estimated: >60 mL/min (ref >60)
Glucose, Bld: 98 mg/dL (ref 70–99)
Potassium: 3.9 mmol/L (ref 3.5–5.1)
Sodium: 136 mmol/L (ref 135–145)
Total Bilirubin: 1.4 mg/dL — ABNORMAL HIGH (ref 0.3–1.2)
Total Protein: 8.8 g/dL — ABNORMAL HIGH (ref 6.5–8.1)

## 2022-05-12 LAB — PROTIME-INR
INR: 1.1 (ref 0.8–1.2)
Prothrombin Time: 13.6 seconds (ref 11.4–15.2)

## 2022-05-12 LAB — ETHANOL: Alcohol, Ethyl (B): 257 mg/dL — ABNORMAL HIGH (ref <10)

## 2022-05-12 LAB — MAGNESIUM: Magnesium: 2.1 mg/dL (ref 1.7–2.4)

## 2022-05-12 LAB — SALICYLATE LEVEL: Salicylate Lvl: <7 mg/dL — ABNORMAL LOW (ref 7.0–30.0)

## 2022-05-12 LAB — ACETAMINOPHEN LEVEL: Acetaminophen (Tylenol), Serum: <10 ug/mL — ABNORMAL LOW (ref 10–30)

## 2022-05-12 MED ORDER — FOLIC ACID 1 MG PO TABS
1.0000 mg | ORAL_TABLET | Freq: Once | ORAL | Status: AC
  Administered 2022-05-12: 1 mg via ORAL
  Filled 2022-05-12: qty 1

## 2022-05-12 MED ORDER — LORAZEPAM 2 MG/ML IJ SOLN
0.0000 mg | Freq: Four times a day (QID) | INTRAMUSCULAR | Status: DC
  Administered 2022-05-12: 4 mg via INTRAVENOUS
  Filled 2022-05-12: qty 2

## 2022-05-12 MED ORDER — SENNA 8.6 MG PO TABS
1.0000 | ORAL_TABLET | Freq: Two times a day (BID) | ORAL | Status: DC
  Administered 2022-05-12 – 2022-05-13 (×3): 8.6 mg via ORAL
  Filled 2022-05-12 (×3): qty 1

## 2022-05-12 MED ORDER — LORAZEPAM 2 MG/ML IJ SOLN
1.0000 mg | INTRAMUSCULAR | Status: AC | PRN
  Administered 2022-05-13: 4 mg via INTRAVENOUS
  Administered 2022-05-13 – 2022-05-14 (×5): 3 mg via INTRAVENOUS
  Administered 2022-05-14: 4 mg via INTRAVENOUS
  Filled 2022-05-12 (×7): qty 2

## 2022-05-12 MED ORDER — ENOXAPARIN SODIUM 40 MG/0.4ML IJ SOSY
40.0000 mg | PREFILLED_SYRINGE | Freq: Every day | INTRAMUSCULAR | Status: DC
  Administered 2022-05-12 – 2022-05-15 (×4): 40 mg via SUBCUTANEOUS
  Filled 2022-05-12 (×4): qty 0.4

## 2022-05-12 MED ORDER — ADULT MULTIVITAMIN W/MINERALS CH
1.0000 | ORAL_TABLET | Freq: Every day | ORAL | Status: DC
  Administered 2022-05-13 – 2022-05-22 (×10): 1 via ORAL
  Filled 2022-05-12 (×10): qty 1

## 2022-05-12 MED ORDER — SODIUM CHLORIDE 0.9 % IV BOLUS
1000.0000 mL | Freq: Once | INTRAVENOUS | Status: AC
  Administered 2022-05-12: 1000 mL via INTRAVENOUS

## 2022-05-12 MED ORDER — SODIUM CHLORIDE 0.45 % IV SOLN: INTRAVENOUS | Status: DC

## 2022-05-12 MED ORDER — LORAZEPAM 1 MG PO TABS
1.0000 mg | ORAL_TABLET | ORAL | Status: AC | PRN
  Administered 2022-05-15: 1 mg via ORAL
  Administered 2022-05-15: 3 mg via ORAL
  Filled 2022-05-12: qty 3
  Filled 2022-05-12: qty 1
  Filled 2022-05-12: qty 4

## 2022-05-12 MED ORDER — THIAMINE MONONITRATE 100 MG PO TABS: 100.0000 mg | ORAL_TABLET | Freq: Every day | ORAL | Status: DC

## 2022-05-12 MED ORDER — LORAZEPAM 1 MG PO TABS: 0.0000 mg | ORAL_TABLET | Freq: Two times a day (BID) | ORAL | Status: DC

## 2022-05-12 MED ORDER — FOLIC ACID 1 MG PO TABS
1.0000 mg | ORAL_TABLET | Freq: Every day | ORAL | Status: DC
  Administered 2022-05-13 – 2022-05-22 (×10): 1 mg via ORAL
  Filled 2022-05-12 (×10): qty 1

## 2022-05-12 MED ORDER — THIAMINE HCL 100 MG/ML IJ SOLN
100.0000 mg | Freq: Every day | INTRAMUSCULAR | Status: DC
  Filled 2022-05-12 (×2): qty 2

## 2022-05-12 MED ORDER — THIAMINE HCL 100 MG/ML IJ SOLN: 100.0000 mg | Freq: Every day | INTRAMUSCULAR | Status: DC

## 2022-05-12 MED ORDER — LORAZEPAM 2 MG/ML IJ SOLN: 0.0000 mg | Freq: Three times a day (TID) | INTRAMUSCULAR | Status: DC

## 2022-05-12 MED ORDER — THIAMINE MONONITRATE 100 MG PO TABS
100.0000 mg | ORAL_TABLET | Freq: Every day | ORAL | Status: DC
  Administered 2022-05-12 – 2022-05-22 (×11): 100 mg via ORAL
  Filled 2022-05-12 (×11): qty 1

## 2022-05-12 MED ORDER — LORAZEPAM 2 MG/ML IJ SOLN
0.0000 mg | INTRAMUSCULAR | Status: DC
  Administered 2022-05-12: 4 mg via INTRAVENOUS
  Administered 2022-05-13: 3 mg via INTRAVENOUS
  Administered 2022-05-13: 4 mg via INTRAVENOUS
  Administered 2022-05-13: 3 mg via INTRAVENOUS
  Administered 2022-05-13 (×2): 4 mg via INTRAVENOUS
  Filled 2022-05-12 (×6): qty 2

## 2022-05-12 MED ORDER — LORAZEPAM 1 MG PO TABS
0.0000 mg | ORAL_TABLET | Freq: Four times a day (QID) | ORAL | Status: DC
  Administered 2022-05-12: 3 mg via ORAL
  Filled 2022-05-12: qty 3

## 2022-05-12 MED ORDER — LORAZEPAM 2 MG/ML IJ SOLN: 0.0000 mg | Freq: Two times a day (BID) | INTRAMUSCULAR | Status: DC

## 2022-05-12 NOTE — ED Notes (Addendum)
Attempted to get a urine from pt, not successful will try again, pt is ambulatory

## 2022-05-12 NOTE — Assessment & Plan Note (Signed)
Long term alcholic. Reports being sober for 5 months prior to the last month of binge drinking. He states he was attending AA 5/wk. He reports 3 admissions to [facility]. He understands the chronic nature of his illness and the danger to poses to him.  Plan Encourage resuming AA meetings daily, if not more often

## 2022-05-12 NOTE — Subjective & Objective (Signed)
Jermaine [name], a 56 y/o chronic alcoholic with multiple admissions for alcohol withdrawal, depression, h/o suicidal ideation, most recently 04/22/22-where he was cleared by psychiatry as not being a danger to himself. He had a 5 month period of abstinence but relapsed about 1 month ago binging drinking 24 "white claw" beverages a day, a 5% alcohol per can beverage. He stopped drinking earlier on the day of admission because he had had enough. He rapidly went into withdrawal with tremors and hallucinations. He presented to [facility] for assistance managing his withdrawal.

## 2022-05-12 NOTE — Assessment & Plan Note (Signed)
Chronic elevation of transaminases, most likely the values aren't higher due to cirrhotic changes. No further GI work up indicated.

## 2022-05-12 NOTE — ED Provider Notes (Signed)
[facility]-EMERGENCY DEPT Provider Note   CSN: [id number] Arrival date & time: 05/12/22  1704     History  Chief Complaint  Patient presents with   Drug / Alcohol Assessment    Ravis Herne is a 56 y.o. male.  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  Patient states that he last had a drink this morning.  States that he then had 1 white claw at around 1 PM.  He states that he has been drinking 24 white claws a day for extended period of time.  He denies other alcohol use such as beer or wine or spirits.  He denies drug use.  He currently feels like he has an withdrawal.  He states that he has been admitted many times for alcohol withdrawal before.  He is unsure if he has previously had a seizure.  He is endorsing headache, nausea, anxiety and tremor.  He denies having hallucinations.  He denies SI/HI/AVH.   Drug / Alcohol Assessment Associated symptoms: nausea   Associated symptoms: no vomiting        Home Medications Prior to Admission medications   Medication Sig Start Date End Date Taking? Authorizing Provider  escitalopram (LEXAPRO) 20 MG tablet Take 20 mg by mouth daily. 02/27/19  Yes [provider]  cyanocobalamin (VITAMIN B12) 1000 MCG tablet Take 1 tablet (1,000 mcg total) by mouth daily. Patient not taking: Reported on 05/12/2022 04/26/22   Rolly Salter, MD  folic acid (FOLVITE) 1 MG tablet Take 1 tablet (1 mg total) by mouth daily. Patient not taking: Reported on 05/12/2022 03/22/19   Marguerita Merles Latif, DO  gabapentin (NEURONTIN) 100 MG capsule Take 1 capsule (100 mg total) by mouth daily. Patient not taking: Reported on 05/12/2022 04/26/22 04/26/23  Rolly Salter, MD  Multiple Vitamin (MULTIVITAMIN WITH MINERALS) TABS tablet Take 1 tablet by mouth daily. Patient not taking: Reported on 05/12/2022 02/06/19   Pearson Grippe, MD   thiamine (VITAMIN B-1) 100 MG tablet Take 1 tablet (100 mg total) by mouth daily. Patient not taking: Reported on 05/12/2022 04/27/22   Rolly Salter, MD      Allergies    Patient has no known allergies.    Review of Systems   Review of Systems  Gastrointestinal:  Positive for nausea. Negative for vomiting.  Neurological:  Positive for tremors.  Psychiatric/Behavioral:  The patient is nervous/anxious.   All other systems reviewed and are negative.   Physical Exam Updated Vital Signs BP (!) 148/117 (BP Location: Right Arm)   Pulse (!) 128   Temp 99.2 F (37.3 C) (Oral)   Resp 20   SpO2 95%  Physical Exam Vitals and nursing note reviewed.  Constitutional:      General: He is in acute distress.     Appearance: He is ill-appearing.  HENT:     Head: Normocephalic.     Mouth/Throat:     Mouth: Mucous membranes are dry.     Pharynx: Oropharynx is clear.  Eyes:     General: No scleral icterus.    Extraocular Movements: Extraocular movements intact.  Cardiovascular:     Rate and Rhythm: Regular rhythm. Tachycardia present.     Pulses: Normal pulses.     Heart sounds: No murmur heard. Pulmonary:     Effort: Pulmonary effort is normal. No respiratory distress.  Abdominal:     General: Bowel sounds are normal. There  is no distension.     Palpations: Abdomen is soft.     Tenderness: There is no abdominal tenderness.  Musculoskeletal:     Cervical back: Neck supple.  Skin:    General: Skin is warm and dry.     Capillary Refill: Capillary refill takes less than 2 seconds.  Neurological:     General: No focal deficit present.     Mental Status: He is alert and oriented to person, place, and time.     Motor: Tremor present.     Gait: Gait abnormal.     Comments: Gross course tremor   Psychiatric:        Mood and Affect: Mood is anxious.        Behavior: Behavior is cooperative.        Thought Content: Thought content does not include homicidal or suicidal ideation.         Judgment: Judgment is impulsive and inappropriate.     ED Results / Procedures / Treatments   Labs (all labs ordered are listed, but only abnormal results are displayed) Labs Reviewed  COMPREHENSIVE METABOLIC PANEL - Abnormal; Notable for the following components:      Result Value   Chloride 87 (*)    CO2 20 (*)    Calcium 8.8 (*)    Total Protein 8.8 (*)    Albumin 5.4 (*)    AST 545 (*)    ALT 404 (*)    Total Bilirubin 1.4 (*)    Anion gap 29 (*)    All other components within normal limits  ETHANOL - Abnormal; Notable for the following components:   Alcohol, Ethyl (B) 257 (*)    All other components within normal limits  CBC - Abnormal; Notable for the following components:   RBC 5.82 (*)    Hemoglobin 17.7 (*)    HCT 52.2 (*)    RDW 16.3 (*)    All other components within normal limits  SALICYLATE LEVEL - Abnormal; Notable for the following components:   Salicylate Lvl <7.0 (*)    All other components within normal limits  ACETAMINOPHEN LEVEL - Abnormal; Notable for the following components:   Acetaminophen (Tylenol), Serum <10 (*)    All other components within normal limits  PROTIME-INR  MAGNESIUM  RAPID URINE DRUG SCREEN, HOSP PERFORMED    EKG None  Radiology US Abdomen Limited RUQ (LIVER/GB)  Result Date: 05/12/2022 CLINICAL DATA:  Right upper quadrant pain EXAM: ULTRASOUND ABDOMEN LIMITED RIGHT UPPER QUADRANT COMPARISON:  CT 04/22/2022 FINDINGS: Gallbladder: No shadowing stones. Gallbladder slightly dilated. Normal wall thickness. Negative sonographic Murphy Common bile duct: Diameter: 3.3 mm Liver: Diffusely echogenic. No focal hepatic abnormality is seen portal vein is patent on color Doppler imaging with normal direction of blood flow towards the liver. Other: None. IMPRESSION: 1. Negative for gallstones or biliary dilatation. 2. Echogenic liver parenchyma suggesting hepatic steatosis. Electronically Signed   By: Jasmine Pang M.D.   On: 05/12/2022 21:04     Procedures .Critical Care  Performed by: Cristopher Peru, PA-C Authorized by: Cristopher Peru, PA-C   Critical care provider statement:    Critical care time (minutes):  40   Critical care time was exclusive of:  Separately billable procedures and treating other patients   Critical care was necessary to treat or prevent imminent or life-threatening deterioration of the following conditions:  Toxidrome   Critical care was time spent personally by me on the following activities:  Development of treatment  plan with patient or surrogate, discussions with consultants, discussions with primary provider, evaluation of patient's response to treatment, examination of patient, obtaining history from patient or surrogate, review of old charts, re-evaluation of patient's condition, pulse oximetry, ordering and review of radiographic studies, ordering and review of laboratory studies and ordering and performing treatments and interventions   I assumed direction of critical care for this patient from another provider in my specialty: no     Care discussed with: admitting provider       Medications Ordered in ED Medications  thiamine (VITAMIN B1) tablet 100 mg (100 mg Oral Given 05/12/22 2026)    Or  thiamine (VITAMIN B1) injection 100 mg ( Intravenous See Alternative 05/12/22 2026)  LORazepam (ATIVAN) tablet 1-4 mg (has no administration in time range)    Or  LORazepam (ATIVAN) injection 1-4 mg (has no administration in time range)  folic acid (FOLVITE) tablet 1 mg (has no administration in time range)  multivitamin with minerals tablet 1 tablet (has no administration in time range)  0.45 % sodium chloride infusion (has no administration in time range)  LORazepam (ATIVAN) injection 0-4 mg (has no administration in time range)    Followed by  LORazepam (ATIVAN) injection 0-4 mg (has no administration in time range)  enoxaparin (LOVENOX) injection 40 mg (has no administration in time range)  senna  (SENOKOT) tablet 8.6 mg (has no administration in time range)  sodium chloride 0.9 % bolus 1,000 mL (0 mLs Intravenous Stopped 05/12/22 2145)  folic acid (FOLVITE) tablet 1 mg (1 mg Oral Given 05/12/22 2027)    ED Course/ Medical Decision Making/ A&P                           Medical Decision Making Amount and/or Complexity of Data Reviewed Labs: ordered. Radiology: ordered.  Risk Decision regarding hospitalization.  Initial Impression and Ddx 56 year old male who presents to the emergency department with alcohol withdrawal. Last drink at 1pm. History of DT and multiple admissions for same. He is ill appearing. He is grossly tremulous. Anxious. Diaphoretic. He is complaining of headache and nausea. Will order CIWA protocol and screening labs.  Patient PMH that increases complexity of ED encounter:  alcoholism   Interpretation of Diagnostics I independent reviewed and interpreted the labs as followed: Alcohol of 257, Tylenol and salicylates are negative, CBC appears to be hemoconcentrated.  CMP with significant transaminitis of an AST of 545, ALT of 404, T. bili of 1.4.  Magnesium is within normal limits, coags are within normal limits  - I independently visualized the following imaging with scope of interpretation limited to determining acute life threatening conditions related to emergency care: Right upper quadrant ultrasound, which revealed no biliary obstruction  Patient Reassessment and Ultimate Disposition/Management On reassessment after IV and p.o. Ativan the patient continues to have significant withdrawal symptoms.  He was also given a liter IV fluids as he is dehydrated along with vitamins.  He did have a significant transaminitis that was increased from previous.  I obtained right upper quadrant ultrasound which did not show any acute biliary obstruction.  This may just all be from acute ingestion.  His alcohol is 257.  No other coingestions.  He has not had any seizure-like  activity here in the emergency department but does have significant withdrawal symptoms that are subjective and objective.  After multiple doses of Ativan he continues to have withdrawal symptoms and feel that he is appropriate for  admission at this time.  He is agreeable with this plan.  I consulted and spoke with Dr. Debby Bud.  He agrees to admit the patient at this time.  Patient management required discussion with the following services or consulting groups:  Hospitalist Service  Complexity of Problems Addressed Acute illness or injury that poses threat of life of bodily function  Additional Data Reviewed and Analyzed Further history obtained from: Past medical history and medications listed in the EMR, Prior ED visit notes, Recent discharge summary, Recent Consult notes, and Prior labs/imaging results  Patient Encounter Risk Assessment SDOH impact on management and Consideration of hospitalization  Final Clinical Impression(s) / ED Diagnoses Final diagnoses:  None    Rx / DC Orders ED Discharge Orders     None         Cristopher Peru, PA-C 05/12/22 2248    Wynetta Fines, MD 05/12/22 2332

## 2022-05-12 NOTE — Assessment & Plan Note (Signed)
Patient with h/o alcohol withdrawal - most recent admission 04/22/22. Also admitted [facility] 09/22/21 where he had DTs. Patient reports 1 month binge - drinking 24 oz EtOH per day. Last drink around 1300hrs. Presents with tremors and endorses hallucinations.  Plan Admit to progressive care  CIWA protocol

## 2022-05-12 NOTE — H&P (Signed)
History and Physical    Jermaine [name] URK:[id number] DOB: March 08, 1966 DOA: 05/12/2022  DOS: the patient was seen and examined on 05/12/2022  PCP: Farris Has, MD   Patient coming from: Home  I have personally briefly reviewed patient's old medical records in [facility]  Jermaine [name], a 56 y/o chronic alcoholic with multiple admissions for alcohol withdrawal, depression, h/o suicidal ideation, most recently 04/22/22-where he was cleared by psychiatry as not being a danger to himself. He had a 5 month period of abstinence but relapsed about 1 month ago binging drinking 24 "white claw" beverages a day, a 5% alcohol per can beverage. He stopped drinking earlier on the day of admission because he had had enough. He rapidly went into withdrawal with tremors and hallucinations. He presented to [facility] -ED for assistance managing his withdrawal.    ED Course: T 99.2  148/117  HR 128  R 20. He appeared per EDP - tremulous and in full blown withdrawal. Lab: CO2 20, Anion gap 29, AST 545, ALT 404, T. Bili 1.4, CBCD nl EtOH 257. CIWA protocol started. TRH called to admit for continued treatment.   Review of Systems:  Review of Systems  Constitutional:  Negative for chills, fever, malaise/fatigue and weight loss.  HENT: Negative.    Eyes:  Negative for blurred vision, double vision and photophobia.  Cardiovascular:  Negative for chest pain and palpitations.  Gastrointestinal:  Positive for constipation. Negative for abdominal pain, heartburn, nausea and vomiting.  Genitourinary: Negative.   Musculoskeletal: Negative.   Skin: Negative.   Neurological:        Tremors, hallucinations, no formication.  Endo/Heme/Allergies:  Negative for polydipsia. Does not bruise/bleed easily.  Psychiatric/Behavioral:  Positive for depression and hallucinations. The patient is nervous/anxious.     Past Medical History:  Diagnosis Date   Alcohol abuse    Anxiety    Class 1 obesity 09/14/2021   Depression      Past Surgical History:  Procedure Laterality Date   HERNIA REPAIR     Soc Hx- never married. Lives alone. 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     reports that he has never smoked. He has never used smokeless tobacco. He reports current alcohol use of about 20.0 standard drinks of alcohol per week. He reports that he does not currently use drugs.  No Known Allergies  Family History  Adopted: Yes    Prior to Admission medications   Medication Sig Start Date End Date Taking? Authorizing Provider  escitalopram (LEXAPRO) 20 MG tablet Take 20 mg by mouth daily. 02/27/19  Yes [provider]  cyanocobalamin (VITAMIN B12) 1000 MCG tablet Take 1 tablet (1,000 mcg total) by mouth daily. Patient not taking: Reported on 05/12/2022 04/26/22   Rolly Salter, MD  folic acid (FOLVITE) 1 MG tablet Take 1 tablet (1 mg total) by mouth daily. Patient not taking: Reported on 05/12/2022 03/22/19   Marguerita Merles Latif, DO  gabapentin (NEURONTIN) 100 MG capsule Take 1 capsule (100 mg total) by mouth daily. Patient not taking: Reported on 05/12/2022 04/26/22 04/26/23  Rolly Salter, MD  Multiple Vitamin (MULTIVITAMIN WITH MINERALS) TABS tablet Take 1 tablet by mouth daily. Patient not taking: Reported on 05/12/2022 02/06/19   Pearson Grippe, MD  thiamine (VITAMIN B-1) 100 MG tablet Take 1 tablet (100 mg total) by mouth daily. Patient not taking: Reported on 05/12/2022 04/27/22   Rolly Salter, MD    Physical Exam: Vitals:   05/12/22 1745 05/12/22 1756 05/12/22  1818 05/12/22 2020  BP: (!) 167/105  (!) 167/105 (!) 148/117  Pulse: (!) 125  (!) 125 (!) 128  Resp:  20  20  Temp:  98.1 F (36.7 C)  99.2 F (37.3 C)  TempSrc:  Oral  Oral  SpO2: 94% 96%  95%    Physical Exam Constitutional:      Appearance: He is normal weight.     Comments: Overweight, haggard appearance.   HENT:     Head: Normocephalic and atraumatic.     Mouth/Throat:     Mouth: Mucous membranes are moist.      Pharynx: Oropharynx is clear.  Eyes:     Extraocular Movements: Extraocular movements intact.     Conjunctiva/sclera: Conjunctivae normal.     Pupils: Pupils are equal, round, and reactive to light.     Comments: No scleral icterus  Cardiovascular:     Rate and Rhythm: Regular rhythm. Tachycardia present.     Pulses: Normal pulses.     Heart sounds: Normal heart sounds.  Pulmonary:     Effort: Pulmonary effort is normal.     Breath sounds: Normal breath sounds.  Abdominal:     General: Bowel sounds are normal. There is no distension.     Palpations: Abdomen is soft.     Tenderness: There is no abdominal tenderness. There is no guarding or rebound.     Comments: No palpable liver edge.  Musculoskeletal:        General: No swelling or deformity. Normal range of motion.     Cervical back: Normal range of motion and neck supple.     Right lower leg: No edema.     Left lower leg: No edema.  Skin:    General: Skin is warm and dry.     Coloration: Skin is not jaundiced.     Findings: No rash.  Neurological:     Mental Status: He is alert and oriented to person, place, and time.     Cranial Nerves: No cranial nerve deficit.  Psychiatric:     Comments: Very anxious, hallucinating per his report w/o formication. Denies suicidal ideation.       Labs on Admission: I have personally reviewed following labs and imaging studies  CBC: Recent Labs  Lab 05/12/22 1810  WBC 9.9  HGB 17.7*  HCT 52.2*  MCV 89.7  PLT 150   Basic Metabolic Panel: Recent Labs  Lab 05/12/22 1810 05/12/22 2013  NA 136  --   K 3.9  --   CL 87*  --   CO2 20*  --   GLUCOSE 98  --   BUN 15  --   CREATININE 1.10  --   CALCIUM 8.8*  --   MG  --  2.1   GFR: CrCl cannot be calculated (Unknown ideal weight.). Liver Function Tests: Recent Labs  Lab 05/12/22 1810  AST 545*  ALT 404*  ALKPHOS 58  BILITOT 1.4*  PROT 8.8*  ALBUMIN 5.4*   No results for input(s): "LIPASE", "AMYLASE" in the last 168  hours. No results for input(s): "AMMONIA" in the last 168 hours. Coagulation Profile: Recent Labs  Lab 05/12/22 1855  INR 1.1   Cardiac Enzymes: No results for input(s): "CKTOTAL", "CKMB", "CKMBINDEX", "TROPONINI" in the last 168 hours. BNP (last 3 results) No results for input(s): "PROBNP" in the last 8760 hours. HbA1C: No results for input(s): "HGBA1C" in the last 72 hours. CBG: No results for input(s): "GLUCAP" in the last 168 hours.  Lipid Profile: No results for input(s): "CHOL", "HDL", "LDLCALC", "TRIG", "CHOLHDL", "LDLDIRECT" in the last 72 hours. Thyroid Function Tests: No results for input(s): "TSH", "T4TOTAL", "FREET4", "T3FREE", "THYROIDAB" in the last 72 hours. Anemia Panel: No results for input(s): "VITAMINB12", "FOLATE", "FERRITIN", "TIBC", "IRON", "RETICCTPCT" in the last 72 hours. Urine analysis:    Component Value Date/Time   COLORURINE YELLOW 04/23/2022 0237   APPEARANCEUR CLEAR 04/23/2022 0237   LABSPEC >1.046 (H) 04/23/2022 0237   PHURINE 5.0 04/23/2022 0237   GLUCOSEU NEGATIVE 04/23/2022 0237   HGBUR NEGATIVE 04/23/2022 0237   BILIRUBINUR NEGATIVE 04/23/2022 0237   KETONESUR 20 (A) 04/23/2022 0237   PROTEINUR NEGATIVE 04/23/2022 0237   NITRITE NEGATIVE 04/23/2022 0237   LEUKOCYTESUR NEGATIVE 04/23/2022 0237    Radiological Exams on Admission: I have personally reviewed images US Abdomen Limited RUQ (LIVER/GB)  Result Date: 05/12/2022 CLINICAL DATA:  Right upper quadrant pain EXAM: ULTRASOUND ABDOMEN LIMITED RIGHT UPPER QUADRANT COMPARISON:  CT 04/22/2022 FINDINGS: Gallbladder: No shadowing stones. Gallbladder slightly dilated. Normal wall thickness. Negative sonographic Murphy Common bile duct: Diameter: 3.3 mm Liver: Diffusely echogenic. No focal hepatic abnormality is seen portal vein is patent on color Doppler imaging with normal direction of blood flow towards the liver. Other: None. IMPRESSION: 1. Negative for gallstones or biliary dilatation. 2.  Echogenic liver parenchyma suggesting hepatic steatosis. Electronically Signed   By: Jasmine Pang M.D.   On: 05/12/2022 21:04    EKG: I have personally reviewed EKG: no EKG on chart  Assessment/Plan Principal Problem:   Alcohol withdrawal delirium (HCC) Active Problems:   Chronic alcohol abuse   Elevated liver enzymes    Assessment and Plan: * Alcohol withdrawal delirium (HCC) Patient with h/o alcohol withdrawal - most recent admission 04/22/22. Also admitted [facility] 09/22/21 where he had DTs. Patient reports 1 month binge - drinking 24 oz EtOH per day. Last drink around 1300hrs. Presents with tremors and endorses hallucinations.  Plan Admit to progressive care  CIWA protocol  Chronic alcohol abuse Long term alcholic. Reports being sober for 5 months prior to the last month of binge drinking. He states he was attending AA 5/wk. He reports 3 admissions to [facility]. He understands the chronic nature of his illness and the danger to poses to him.  Plan Encourage resuming AA meetings daily, if not more often  Elevated liver enzymes Chronic elevation of transaminases, most likely the values aren't higher due to cirrhotic changes. No further GI work up indicated.        DVT prophylaxis: Lovenox Code Status: Full Code Family Communication: no working number for sister Keshan Reha  Disposition Plan: home when medically stable  Consults called: none  Admission status: Inpatient,  progressive care   Illene Regulus, MD [facility] 05/12/2022, 10:52 PM

## 2022-05-12 NOTE — ED Provider Triage Note (Cosign Needed Addendum)
Emergency Medicine Provider Triage Evaluation Note  Jermaine [name] , a 56 y.o. male  was evaluated in triage.  Pt complains of alcohol withdrawal. Last drink 2 hours ago, not sure how long prior to that. Drinks about 24 White Claw seltzers daily. Feels as though he is going to have a seizure  Review of Systems  Positive: Tremors, lightheadedness, vomiting Negative:   Physical Exam  BP (!) 167/105   Pulse (!) 125   Temp 98.1 F (36.7 C) (Oral)   Resp 20   SpO2 96%  Gen:   Awake, no distress   Resp:  Normal effort  MSK:   Moves extremities without difficulty  Other:  Appears acutely ill  Medical Decision Making  Medically screening exam initiated at 5:58 PM.  Appropriate orders placed.  Jermaine [name] was informed that the remainder of the evaluation will be completed by another provider, this initial triage assessment does not replace that evaluation, and the importance of remaining in the ED until their evaluation is complete.  Per chart review, patient admitted numerous times for alcohol withdrawal. Medical clearance labs ordered. Medications ordered per CIWA protocol   ,  T, PA-C 05/12/22 1800   Psych hold order placed on accident, removed   ,  T, PA-C 05/12/22 1825

## 2022-05-13 ENCOUNTER — Other Ambulatory Visit: Payer: Self-pay

## 2022-05-13 DIAGNOSIS — F10931 Alcohol use, unspecified with withdrawal delirium: Secondary | ICD-10-CM | POA: Diagnosis not present

## 2022-05-13 LAB — COMPREHENSIVE METABOLIC PANEL
ALT: 314 U/L — ABNORMAL HIGH (ref 0–44)
AST: 433 U/L — ABNORMAL HIGH (ref 15–41)
Albumin: 4.5 g/dL (ref 3.5–5.0)
Alkaline Phosphatase: 47 U/L (ref 38–126)
Anion gap: 15 (ref 5–15)
BUN: 20 mg/dL (ref 6–20)
CO2: 28 mmol/L (ref 22–32)
Calcium: 8.9 mg/dL (ref 8.9–10.3)
Chloride: 92 mmol/L — ABNORMAL LOW (ref 98–111)
Creatinine, Ser: 1.03 mg/dL (ref 0.61–1.24)
GFR, Estimated: >60 mL/min (ref >60)
Glucose, Bld: 108 mg/dL — ABNORMAL HIGH (ref 70–99)
Potassium: 3.8 mmol/L (ref 3.5–5.1)
Sodium: 135 mmol/L (ref 135–145)
Total Bilirubin: 1.7 mg/dL — ABNORMAL HIGH (ref 0.3–1.2)
Total Protein: 7.2 g/dL (ref 6.5–8.1)

## 2022-05-13 LAB — RAPID URINE DRUG SCREEN, HOSP PERFORMED
Amphetamines: NOT DETECTED
Barbiturates: POSITIVE — AB
Benzodiazepines: POSITIVE — AB
Cocaine: NOT DETECTED
Opiates: NOT DETECTED
Tetrahydrocannabinol: NOT DETECTED

## 2022-05-13 MED ORDER — LORAZEPAM 2 MG/ML IJ SOLN: 0.0000 mg | INTRAMUSCULAR | Status: DC

## 2022-05-13 MED ORDER — ESCITALOPRAM OXALATE 20 MG PO TABS
20.0000 mg | ORAL_TABLET | Freq: Every day | ORAL | Status: DC
  Administered 2022-05-13 – 2022-05-22 (×10): 20 mg via ORAL
  Filled 2022-05-13 (×10): qty 1

## 2022-05-13 MED ORDER — ORAL CARE MOUTH RINSE: 15.0000 mL | OROMUCOSAL | Status: DC | PRN

## 2022-05-13 MED ORDER — LORAZEPAM 2 MG/ML IJ SOLN: 0.0000 mg | Freq: Three times a day (TID) | INTRAMUSCULAR | Status: DC

## 2022-05-13 NOTE — Progress Notes (Signed)
PROGRESS NOTE  Jermaine [name]  [id number] DOB: Mar 04, 1966 DOA: 05/12/2022 PCP: London Pepper, MD   Brief Narrative: Patient is a 56 year old male with history of chronic alcoholism, multiple admissions in the past for alcohol withdrawal, depression, suicidal ideation, most recently discharged on 11/30 /23  after being cleared from psychiatry presents with alcohol withdrawal, tremor, hallucination.  On presentation, he was hypertensive, tachycardic, tremulous. Alcohol level of 257.  Started  on CIWA protocol.    Assessment & Plan:  Principal Problem:   Alcohol withdrawal delirium (HCC) Active Problems:   Chronic alcohol abuse   Elevated liver enzymes   Acute alcohol withdrawal: Last admission on 11/26.  Multiple admissions in the past for alcohol withdrawal.  Reported 1 month binge.  Presented with tremor, hallucination. UDS positive for benzo, barbiturates Started on CIWA protocol. Very tremulous, anxious today but oriented  Chronic alcohol abuse: Reported being sober for 5 months prior to last month of binge drinking.  He was also attending AA 5 times a week, [facility]. we encourage resuming AA meetings.  Continue thiamine folic acid. TOC will be consulted  Elevated liver enzymes: Chronic elevation of liver enzymes but worsened on this admission.  Continue to monitor.  Ultrasound of the liver showed hepatic steatosis.  Anxiety: on Lexapro at home        DVT prophylaxis:enoxaparin (LOVENOX) injection 40 mg Start: 05/12/22 2245     Code Status: Full Code  Family Communication:None at bedside   Patient status:Inpatient  Patient is from :Home  Anticipated discharge [id number]  Estimated DC date:Not sure   Consultants: None  Procedures:None  Antimicrobials:  Anti-infectives (From admission, onward)    None       Subjective: Patient seen and examined at bedside today.  Hemodynamically stable during my evaluation but looks very anxious, tremulous, and  withdrawal.  Alert and oriented though.  Denies any other complaints  Objective: Vitals:   05/12/22 2020 05/12/22 2312 05/12/22 2340 05/13/22 0347  BP: (!) 148/117 (!) 140/97 (!) 158/103 135/80  Pulse: (!) 128 (!) 110 (!) 113 (!) 112  Resp: 20  20 20   Temp: 99.2 F (37.3 C)  98.6 F (37 C) 99 F (37.2 C)  TempSrc: Oral  Oral Oral  SpO2: 95%  95% 94%  Weight:   88.1 kg   Height:   5\' 10"  (1.778 m)     Intake/Output Summary (Last 24 hours) at 05/13/2022 0752 Last data filed at 05/13/2022 0346 Gross per 24 hour  Intake 300.39 ml  Output 650 ml  Net -349.61 ml   Filed Weights   05/12/22 2340  Weight: 88.1 kg    Examination:  General exam: anxious, tremulous HEENT: PERRL Respiratory system:  no wheezes or crackles  Cardiovascular system: Sinus tachycardia.  Gastrointestinal system: Abdomen is nondistended, soft and nontender. Central nervous system: Alert and oriented Extremities: No edema, no clubbing ,no cyanosis Skin: No rashes, no ulcers,no icterus     Data Reviewed: I have personally reviewed following labs and imaging studies  CBC: Recent Labs  Lab 05/12/22 1810  WBC 9.9  HGB 17.7*  HCT 52.2*  MCV 89.7  PLT [id number]   Basic Metabolic Panel: Recent Labs  Lab 05/12/22 1810 05/12/22 2013  NA 136  --   K 3.9  --   CL 87*  --   CO2 20*  --   GLUCOSE 98  --   BUN 15  --   CREATININE 1.10  --   CALCIUM 8.8*  --  MG  --  2.1     No results found for this or any previous visit (from the past 240 hour(s)).   Radiology Studies: US Abdomen Limited RUQ (LIVER/GB)  Result Date: 05/12/2022 CLINICAL DATA:  Right upper quadrant pain EXAM: ULTRASOUND ABDOMEN LIMITED RIGHT UPPER QUADRANT COMPARISON:  CT 04/22/2022 FINDINGS: Gallbladder: No shadowing stones. Gallbladder slightly dilated. Normal wall thickness. Negative sonographic Murphy Common bile duct: Diameter: 3.3 mm Liver: Diffusely echogenic. No focal hepatic abnormality is seen portal vein is patent on  color Doppler imaging with normal direction of blood flow towards the liver. Other: None. IMPRESSION: 1. Negative for gallstones or biliary dilatation. 2. Echogenic liver parenchyma suggesting hepatic steatosis. Electronically Signed   By: Jasmine Pang M.D.   On: 05/12/2022 21:04    Scheduled Meds:  enoxaparin (LOVENOX) injection  40 mg Subcutaneous QHS   folic acid  1 mg Oral Daily   LORazepam  0-4 mg Intravenous Q4H   Followed by   Melene Muller ON 05/15/2022] LORazepam  0-4 mg Intravenous Q8H   multivitamin with minerals  1 tablet Oral Daily   senna  1 tablet Oral BID   thiamine  100 mg Oral Daily   Or   thiamine  100 mg Intravenous Daily   Continuous Infusions:  sodium chloride 100 mL/hr at 05/12/22 2358     LOS: 1 day   Burnadette Pop, MD [facility] P12/17/2023, 7:52 AM

## 2022-05-13 NOTE — Plan of Care (Signed)
  Problem: Education: Goal: Knowledge of General Education information will improve Description: Including pain rating scale, medication(s)/side effects and non-pharmacologic comfort measures Outcome: Progressing   Problem: Coping: Goal: Level of anxiety will decrease Outcome: Progressing   Problem: Pain Managment: Goal: General experience of comfort will improve Outcome: Progressing   

## 2022-05-14 DIAGNOSIS — F10931 Alcohol use, unspecified with withdrawal delirium: Secondary | ICD-10-CM | POA: Diagnosis not present

## 2022-05-14 LAB — BASIC METABOLIC PANEL
Anion gap: 10 (ref 5–15)
BUN: 17 mg/dL (ref 6–20)
CO2: 26 mmol/L (ref 22–32)
Calcium: 9.4 mg/dL (ref 8.9–10.3)
Chloride: 101 mmol/L (ref 98–111)
Creatinine, Ser: 0.79 mg/dL (ref 0.61–1.24)
GFR, Estimated: >60 mL/min (ref >60)
Glucose, Bld: 129 mg/dL — ABNORMAL HIGH (ref 70–99)
Potassium: 3.9 mmol/L (ref 3.5–5.1)
Sodium: 137 mmol/L (ref 135–145)

## 2022-05-14 LAB — COMPREHENSIVE METABOLIC PANEL
ALT: 232 U/L — ABNORMAL HIGH (ref 0–44)
AST: 298 U/L — ABNORMAL HIGH (ref 15–41)
Albumin: 2.8 g/dL — ABNORMAL LOW (ref 3.5–5.0)
Alkaline Phosphatase: 31 U/L — ABNORMAL LOW (ref 38–126)
Anion gap: 8 (ref 5–15)
BUN: 10 mg/dL (ref 6–20)
CO2: 22 mmol/L (ref 22–32)
Calcium: 6.3 mg/dL — CL (ref 8.9–10.3)
Chloride: 92 mmol/L — ABNORMAL LOW (ref 98–111)
Creatinine, Ser: 0.4 mg/dL — ABNORMAL LOW (ref 0.61–1.24)
GFR, Estimated: >60 mL/min (ref >60)
Glucose, Bld: 73 mg/dL (ref 70–99)
Potassium: 2.5 mmol/L — CL (ref 3.5–5.1)
Sodium: 122 mmol/L — ABNORMAL LOW (ref 135–145)
Total Bilirubin: 1 mg/dL (ref 0.3–1.2)
Total Protein: 4.6 g/dL — ABNORMAL LOW (ref 6.5–8.1)

## 2022-05-14 MED ORDER — ACETAMINOPHEN 325 MG PO TABS
650.0000 mg | ORAL_TABLET | Freq: Four times a day (QID) | ORAL | Status: DC | PRN
  Administered 2022-05-14 – 2022-05-17 (×4): 650 mg via ORAL
  Filled 2022-05-14 (×4): qty 2

## 2022-05-14 MED ORDER — PHENOBARBITAL SODIUM 130 MG/ML IJ SOLN
97.5000 mg | Freq: Three times a day (TID) | INTRAMUSCULAR | Status: AC
  Administered 2022-05-14 – 2022-05-16 (×6): 97.5 mg via INTRAVENOUS
  Filled 2022-05-14 (×6): qty 1

## 2022-05-14 MED ORDER — PHENOBARBITAL SODIUM 130 MG/ML IJ SOLN: 130.0000 mg | Freq: Once | INTRAMUSCULAR | Status: DC

## 2022-05-14 MED ORDER — SODIUM CHLORIDE 0.9 % IV SOLN
260.0000 mg | INTRAVENOUS | Status: AC
  Administered 2022-05-14: 260 mg via INTRAVENOUS
  Filled 2022-05-14: qty 2

## 2022-05-14 MED ORDER — POTASSIUM CHLORIDE 20 MEQ PO PACK
60.0000 meq | PACK | Freq: Once | ORAL | Status: AC
  Administered 2022-05-14: 60 meq via ORAL
  Filled 2022-05-14: qty 3

## 2022-05-14 MED ORDER — SODIUM CHLORIDE 0.9 % IV SOLN: INTRAVENOUS | Status: DC

## 2022-05-14 MED ORDER — PHENOBARBITAL SODIUM 130 MG/ML IJ SOLN
65.0000 mg | Freq: Three times a day (TID) | INTRAMUSCULAR | Status: AC
  Administered 2022-05-16 – 2022-05-18 (×6): 65 mg via INTRAVENOUS
  Filled 2022-05-14 (×6): qty 1

## 2022-05-14 MED ORDER — PHENOBARBITAL SODIUM 65 MG/ML IJ SOLN: 32.5000 mg | Freq: Three times a day (TID) | INTRAMUSCULAR | Status: DC

## 2022-05-14 NOTE — Progress Notes (Signed)
Patient's CIWA score currently 25. Patient A&O, visible tremors, barely able to drink from a cup. BP elevated but other VSS. Pt states he is hearing "constant whispering" and seeing objects/furniture in the room move around. Very unsteady on his feet, even to stand. C/o mild nausea and a constant HA. Continuing to ask for Ativan hourly per report from previous shift. Per patient, he has been through this "several times" and "Ativan usually starts helping faster." MD Adhikari made aware- see new orders. RR RN also made aware. Bed alarm on, will continue to monitor closely.

## 2022-05-14 NOTE — Progress Notes (Signed)
PROGRESS NOTE  Jermaine [name]  IDP:[id number] DOB: 1966-02-23 DOA: 05/12/2022 PCP: Farris Has, MD   Brief Narrative: Patient is a 56 year old male with history of chronic alcoholism, multiple admissions in the past for alcohol withdrawal, depression, suicidal ideation, most recently discharged on 11/30 /23  after being cleared from psychiatry presents with alcohol withdrawal, tremor, hallucination.  On presentation, he was hypertensive, tachycardic, tremulous. Alcohol level of 257.  Started  on CIWA protocol.  Currently being managed for severe alcohol withdrawal.  Assessment & Plan:  Principal Problem:   Alcohol withdrawal delirium (HCC) Active Problems:   Chronic alcohol abuse   Elevated liver enzymes   Acute alcohol withdrawal: Last admission on 11/26.  Multiple admissions in the past for alcohol withdrawal.  Reported 1 month binge.  Presented with tremor, hallucination. UDS positive for benzo, barbiturates Started on CIWA protocol. Very tremulous, anxious today but oriented.  PCM consulted for consideration of Precedex.  As per PCCM,he does not need it for now, added phenobarbital  Chronic alcohol abuse: Reported being sober for 5 months prior to last month of binge drinking.  He was also attending [facility] times a week[facility]. we encourage resuming AA meetings.  Continue thiamine folic acid. TOC  consulted  Elevated liver enzymes: Chronic elevation of liver enzymes but worsened on this admission.  Continue to monitor.  Ultrasound of the liver showed hepatic steatosis.Improving  Anxiety: on Lexapro at home        DVT prophylaxis:enoxaparin (LOVENOX) injection 40 mg Start: 05/12/22 2245     Code Status: Full Code  Family Communication:None at bedside   Patient status:Inpatient  Patient is from :Home  Anticipated discharge WE:[id number]  Estimated DC date:Not sure   Consultants: None  Procedures:None  Antimicrobials:  Anti-infectives (From admission, onward)     None       Subjective: Patient seen and examined at bedside today.  Appears very anxious, tremulous with tremor but remains oriented, not agitated.  Frequently asking for Ativan  Objective: Vitals:   05/14/22 0801 05/14/22 0911 05/14/22 1153 05/14/22 1220  BP: (!) 125/102 119/76 (!) 119/98 (!) 136/99  Pulse: 97 (!) 107 89 88  Resp:  20 17 18   Temp:  98.5 F (36.9 C)    TempSrc:  Oral    SpO2: 96% 96% 97% 97%  Weight:      Height:        Intake/Output Summary (Last 24 hours) at 05/14/2022 1308 Last data filed at 05/14/2022 0824 Gross per 24 hour  Intake 200 ml  Output 300 ml  Net -100 ml   Filed Weights   05/12/22 2340  Weight: 88.1 kg    Examination:  General exam: anxious, tremulous HEENT: PERRL Respiratory system:  no wheezes or crackles  Cardiovascular system: Sinus tachycardia.  Gastrointestinal system: Abdomen is nondistended, soft and nontender. Central nervous system: Alert and oriented Extremities: No edema, no clubbing ,no cyanosis,hand tremors Skin: No rashes, no ulcers,no icterus     Data Reviewed: I have personally reviewed following labs and imaging studies  CBC: Recent Labs  Lab 05/12/22 1810  WBC 9.9  HGB 17.7*  HCT 52.2*  MCV 89.7  PLT 150   Basic Metabolic Panel: Recent Labs  Lab 05/12/22 1810 05/12/22 2013 05/13/22 1037 05/14/22 0447 05/14/22 1057  NA 136  --  135 122* 137  K 3.9  --  3.8 2.5* 3.9  CL 87*  --  92* 92* 101  CO2 20*  --  28 22 26  GLUCOSE 98  --  108* 73 129*  BUN 15  --  20 10 17   CREATININE 1.10  --  1.03 0.40* 0.79  CALCIUM 8.8*  --  8.9 6.3* 9.4  MG  --  2.1  --   --   --      No results found for this or any previous visit (from the past 240 hour(s)).   Radiology Studies: Abdomen Limited RUQ (LIVER/GB)  Result Date: 05/12/2022 CLINICAL DATA:  Right upper quadrant pain EXAM: ULTRASOUND ABDOMEN LIMITED RIGHT UPPER QUADRANT COMPARISON:  CT 04/22/2022 FINDINGS: Gallbladder: No shadowing  stones. Gallbladder slightly dilated. Normal wall thickness. Negative sonographic Murphy Common bile duct: Diameter: 3.3 mm Liver: Diffusely echogenic. No focal hepatic abnormality is seen portal vein is patent on color Doppler imaging with normal direction of blood flow towards the liver. Other: None. IMPRESSION: 1. Negative for gallstones or biliary dilatation. 2. Echogenic liver parenchyma suggesting hepatic steatosis. Electronically Signed   By: 04/24/2022 M.D.   On: 05/12/2022 21:04    Scheduled Meds:  enoxaparin (LOVENOX) injection  40 mg Subcutaneous QHS   escitalopram  20 mg Oral Daily   folic acid  1 mg Oral Daily   multivitamin with minerals  1 tablet Oral Daily   PHENObarbital  97.5 mg Intravenous Q8H   Followed by   [START ON 05/16/2022] PHENObarbital  65 mg Intravenous Q8H   Followed by   05/18/2022 ON 05/18/2022] PHENObarbital  32.5 mg Intravenous Q8H   thiamine  100 mg Oral Daily   Or   thiamine  100 mg Intravenous Daily   Continuous Infusions:  sodium chloride 125 mL/hr at 05/14/22 0801     LOS: 2 days   05/16/22, MD [facility] P12/18/2023, 1:08 PM

## 2022-05-14 NOTE — Consult Note (Signed)
NAME:  Jermaine [name], MRN:  [id number], DOB:  1966-02-18, LOS: 2 ADMISSION DATE:  05/12/2022, CONSULTATION DATE:  05/14/22 REFERRING MD:  Renford Dills CHIEF COMPLAINT:  EtOH Withdrawal   History of Present Illness:  Jermaine [name] is a 56 y.o. male who has a PMH as below  including but not limited to EtOH dependence with hx of withdrawal. He had recent admission 11/26 for the same and was cleared by psych after deemed to not be a danger to himself. He had apparently had 5 months of abstinence prior to that but then relapsed. He tells me since then he has been drinking upwards of 24 [place] beverages per day plus the occasional liquor drink at a bar. His last drink was 12/15. On 12/16, he presented to [facility] ED with EtOH withdrawal symptoms and for assistance. He apparently was having hallucinations at the time.  He was started on standard CIWA therapy with Ativan. On 12/18, PCCM asked to see in consultation given persistent tremors and anxiousness. He had received 7mg  Ativan in past 3.5 hours during this shift and just prior to me examining him. Per TRH, he has received around 20mg  Ativan in past 24 hours.  He is A&O x 3 but does have ongoing tremor and is anxious. He tells me that "I know I need to stop and things haven't been this bad in a while. Please help me".  Pertinent  Medical History:  has Alcohol abuse with intoxication (HCC); Chronic alcohol abuse; Elevated liver enzymes; Thrombocytopenia (HCC); Acute alcohol intoxication with alcoholism (HCC); Alcohol withdrawal (HCC); Alcoholic cirrhosis of liver without ascites (HCC); Anxiety and depression; Nausea & vomiting; Sinus tachycardia; Hypokalemia; Hypomagnesemia; Hypocalcemia; Leukocytosis; High anion gap metabolic acidosis; Class 1 obesity; Secondary polycythemia; Suicide ideation; Alcohol withdrawal delirium (HCC); Diverticulitis; Acute respiratory failure with hypoxia (HCC); and Dehydration on their problem list.  Significant Hospital  Events: Including procedures, antibiotic start and stop dates in addition to other pertinent events   12/16 admit. 12/18 PCCM consult.  Interim History / Subjective:  Awake, alert. Does have ongoing tremor. Is a bit anxious but is calm and cooperative.  Objective:  Blood pressure 119/76, pulse (!) 107, temperature 98.5 F (36.9 C), temperature source Oral, resp. rate 20, height 5\' 10"  (1.778 m), weight 88.1 kg, SpO2 96 %.    FiO2 (%):  [21 %] 21 %   Intake/Output Summary (Last 24 hours) at 05/14/2022 1031 Last data filed at 05/14/2022 0824 Gross per 24 hour  Intake 200 ml  Output 300 ml  Net -100 ml   Filed Weights   05/12/22 2340  Weight: 88.1 kg    Examination: General: Adult male, resting in bed, in NAD. Neuro: Awake and alert. Is anxious but is calm and cooperative. MAE's. + Asterixis. HEENT: [place]/AT. Sclerae anicteric. EOMI. Cardiovascular: RRR, no M/R/G.  Lungs: Respirations even and unlabored.  CTA bilaterally, No W/R/R. Abdomen: BS x 4, soft, NT/ND.  Musculoskeletal: No gross deformities, no edema.  Skin: Intact, warm, no rashes.   Assessment & Plan:   Acute EtOH withdrawal - long standing hx of EtOH dependence and multiple admissions for the same. Had apparently been going to AA multiple times a week and has had some admission to 05/16/2022. - Does not need Precedex at this point. - Will add on Phenobarbital taper. - Continue Ativan. - RN and primary team MD notified and aware that if he has no response or worsens at all, call 05/16/2022 back and we can certainly revisit ICU transfer  and consideration for Precedex. - Continue Thiamine, Folate, MV. - Needs TOC assistance upon discharge for additional resources to aid in long term abstinence etc.  Rest per primary team. I will check back on him later this afternoon to ensure he is stable. Otherwise, PCCM will sign off.  Please call us back if we can be of any further assistance.    Best practice (evaluated  daily):  Per primary team.  Labs   CBC: Recent Labs  Lab 05/12/22 1810  WBC 9.9  HGB 17.7*  HCT 52.2*  MCV 89.7  PLT 150    Basic Metabolic Panel: Recent Labs  Lab 05/12/22 1810 05/12/22 2013 05/13/22 1037 05/14/22 0447  NA 136  --  135 122*  K 3.9  --  3.8 2.5*  CL 87*  --  92* 92*  CO2 20*  --  28 22  GLUCOSE 98  --  108* 73  BUN 15  --  20 10  CREATININE 1.10  --  1.03 0.40*  CALCIUM 8.8*  --  8.9 6.3*  MG  --  2.1  --   --    GFR: Estimated Creatinine Clearance: 115.2 mL/min (A) (by C-G formula based on SCr of 0.4 mg/dL (L)). Recent Labs  Lab 05/12/22 1810  WBC 9.9    Liver Function Tests: Recent Labs  Lab 05/12/22 1810 05/13/22 1037 05/14/22 0447  AST 545* 433* 298*  ALT 404* 314* 232*  ALKPHOS 58 47 31*  BILITOT 1.4* 1.7* 1.0  PROT 8.8* 7.2 4.6*  ALBUMIN 5.4* 4.5 2.8*   No results for input(s): "LIPASE", "AMYLASE" in the last 168 hours. No results for input(s): "AMMONIA" in the last 168 hours.  ABG    Component Value Date/Time   HCO3 29.2 (H) 04/22/2022 2325   O2SAT 97.5 04/22/2022 2325     Coagulation Profile: Recent Labs  Lab 05/12/22 1855  INR 1.1    Cardiac Enzymes: No results for input(s): "CKTOTAL", "CKMB", "CKMBINDEX", "TROPONINI" in the last 168 hours.  HbA1C: Hgb A1c MFr Bld  Date/Time Value Ref Range Status  03/20/2019 03:46 AM 5.4 4.8 - 5.6 % Final    Comment:    (NOTE) Pre diabetes:          5.7%-6.4% Diabetes:              >6.4% Glycemic control for   <7.0% adults with diabetes     CBG: No results for input(s): "GLUCAP" in the last 168 hours.  Review of Systems:   All negative; except for those that are bolded, which indicate positives.  Constitutional: weight loss, weight gain, night sweats, fevers, chills, fatigue, weakness.  HEENT: headaches, sore throat, sneezing, nasal congestion, post nasal drip, difficulty swallowing, tooth/dental problems, visual complaints, visual changes, ear aches. Neuro:  difficulty with speech, weakness, numbness, ataxia, tremor. CV:  chest pain, orthopnea, PND, swelling in lower extremities, dizziness, palpitations, syncope.  Resp: cough, hemoptysis, dyspnea, wheezing. GI: heartburn, indigestion, abdominal pain, nausea, vomiting, diarrhea, constipation, change in bowel habits, loss of appetite, hematemesis, melena, hematochezia.  GU: dysuria, change in color of urine, urgency or frequency, flank pain, hematuria. MSK: joint pain or swelling, decreased range of motion. Psych: change in mood or affect, depression, anxiety, suicidal ideations, homicidal ideations. Skin: rash, itching, bruising.   Past Medical History:  He,  has a past medical history of Alcohol abuse, Anxiety, Class 1 obesity (09/14/2021), and Depression.   Surgical History:   Past Surgical History:  Procedure Laterality Date  HERNIA REPAIR       Social History:   reports that he has never smoked. He has never used smokeless tobacco. He reports current alcohol use of about 20.0 standard drinks of alcohol per week. He reports that he does not currently use drugs.   Family History:  His family history is not on file. He was adopted.   Allergies No Known Allergies   Home Medications  Prior to Admission medications   Medication Sig Start Date End Date Taking? Authorizing Provider  escitalopram (LEXAPRO) 20 MG tablet Take 20 mg by mouth daily. 02/27/19  Yes [provider]  cyanocobalamin (VITAMIN B12) 1000 MCG tablet Take 1 tablet (1,000 mcg total) by mouth daily. Patient not taking: Reported on 05/12/2022 04/26/22   Rolly Salter, MD  folic acid (FOLVITE) 1 MG tablet Take 1 tablet (1 mg total) by mouth daily. Patient not taking: Reported on 05/12/2022 03/22/19   Marguerita Merles Latif, DO  gabapentin (NEURONTIN) 100 MG capsule Take 1 capsule (100 mg total) by mouth daily. Patient not taking: Reported on 05/12/2022 04/26/22 04/26/23  Rolly Salter, MD  Multiple Vitamin  (MULTIVITAMIN WITH MINERALS) TABS tablet Take 1 tablet by mouth daily. Patient not taking: Reported on 05/12/2022 02/06/19   Pearson Grippe, MD  thiamine (VITAMIN B-1) 100 MG tablet Take 1 tablet (100 mg total) by mouth daily. Patient not taking: Reported on 05/12/2022 04/27/22   Rolly Salter, MD      Rutherford Guys, PA - [facility] For pager details, please see AMION or use Epic chat  After 1900, please call [facility] for cross coverage needs 05/14/2022, 10:31 AM

## 2022-05-15 DIAGNOSIS — F10931 Alcohol use, unspecified with withdrawal delirium: Secondary | ICD-10-CM | POA: Diagnosis not present

## 2022-05-15 LAB — COMPREHENSIVE METABOLIC PANEL
ALT: 267 U/L — ABNORMAL HIGH (ref 0–44)
AST: 283 U/L — ABNORMAL HIGH (ref 15–41)
Albumin: 3.2 g/dL — ABNORMAL LOW (ref 3.5–5.0)
Alkaline Phosphatase: 42 U/L (ref 38–126)
Anion gap: 7 (ref 5–15)
BUN: 10 mg/dL (ref 6–20)
CO2: 23 mmol/L (ref 22–32)
Calcium: 7.6 mg/dL — ABNORMAL LOW (ref 8.9–10.3)
Chloride: 108 mmol/L (ref 98–111)
Creatinine, Ser: 0.58 mg/dL — ABNORMAL LOW (ref 0.61–1.24)
GFR, Estimated: >60 mL/min (ref >60)
Glucose, Bld: 87 mg/dL (ref 70–99)
Potassium: 3.3 mmol/L — ABNORMAL LOW (ref 3.5–5.1)
Sodium: 138 mmol/L (ref 135–145)
Total Bilirubin: 0.6 mg/dL (ref 0.3–1.2)
Total Protein: 5.4 g/dL — ABNORMAL LOW (ref 6.5–8.1)

## 2022-05-15 MED ORDER — POTASSIUM CHLORIDE CRYS ER 20 MEQ PO TBCR
40.0000 meq | EXTENDED_RELEASE_TABLET | Freq: Once | ORAL | Status: AC
  Administered 2022-05-15: 40 meq via ORAL
  Filled 2022-05-15: qty 2

## 2022-05-15 NOTE — Progress Notes (Signed)
RN received report on patient from off-going RN. RN with resume patient's care for the rest of the shift and will continue to monitor the patient for any changes. RN agrees with patient's most recent assessment. RN made sure to lock-up patient's belonging for safety per suicide precautions.

## 2022-05-15 NOTE — TOC Initial Note (Signed)
Transition of Care [facility]) - Initial/Assessment Note    Patient Details  Name: Jermaine [name] MRN: [id number] Date of Birth: 09-26-1965  [facility]) CM/SW Contact:    Golda Acre, RN Phone Number: 05/15/2022, 9:00 AM  Clinical Narrative:                 Chronic use of alcohol has been given resources for treatment in the past month.  Still has them.  Expected Discharge Plan: Home/Self Care Barriers to Discharge: Continued Medical Work up   Patient Goals and CMS Choice Patient states their goals for this hospitalization and ongoing recovery are:: to go home CMS Medicare.gov Compare Post Acute Care list provided to:: Patient Choice offered to / list presented to : Patient  Expected Discharge Plan and Services Expected Discharge Plan: Home/Self Care   Discharge Planning Services: CM Consult   Living arrangements for the past 2 months: Single Family Home                                      Prior Living Arrangements/Services Living arrangements for the past 2 months: Single Family Home Lives with:: Self Patient language and need for interpreter reviewed:: Yes Do you feel safe going back to the place where you live?: Yes               Activities of Daily Living Home Assistive Devices/Equipment: None ADL Screening (condition at time of admission) Patient's cognitive ability adequate to safely complete daily activities?: No Is the patient deaf or have difficulty hearing?: No Does the patient have difficulty seeing, even when wearing glasses/contacts?: No Does the patient have difficulty concentrating, remembering, or making decisions?: Yes Patient able to express need for assistance with ADLs?: No Does the patient have difficulty dressing or bathing?: Yes Independently performs ADLs?: Yes (appropriate for developmental age) Does the patient have difficulty walking or climbing stairs?: Yes Weakness of Legs: Both Weakness of Arms/Hands:  None  Permission Sought/Granted                  Emotional Assessment Appearance:: Appears older than stated age Attitude/Demeanor/Rapport: Ambitious, Engaged Affect (typically observed): Calm, Appropriate Orientation: : Oriented to Self, Oriented to Place, Oriented to  Time, Oriented to Situation Alcohol / Substance Use: Alcohol Use    Admission diagnosis:  Alcohol withdrawal delirium (HCC) [F10.931] Patient Active Problem List   Diagnosis Date Noted   Suicide ideation 04/22/2022   Alcohol withdrawal delirium (HCC) 04/22/2022   Diverticulitis 04/22/2022   Acute respiratory failure with hypoxia (HCC) 04/22/2022   Dehydration 04/22/2022   Hypocalcemia 09/14/2021   Leukocytosis 09/14/2021   High anion gap metabolic acidosis 09/14/2021   Class 1 obesity 09/14/2021   Secondary polycythemia 09/14/2021   Hypokalemia 07/02/2021   Hypomagnesemia 07/02/2021   Nausea & vomiting 07/01/2021   Sinus tachycardia 07/01/2021   Anxiety and depression 11/20/2018   Alcohol withdrawal (HCC) 11/19/2018   Alcoholic cirrhosis of liver without ascites (HCC) 11/19/2018   Alcohol abuse with intoxication (HCC) 11/16/2018   Chronic alcohol abuse 11/16/2018   Elevated liver enzymes 11/16/2018   Thrombocytopenia (HCC) 11/16/2018   Acute alcohol intoxication with alcoholism (HCC) 11/16/2018   PCP:  Farris Has, MD Pharmacy:   [id number] Otto, [place] - [address] [place] [address] Phone: [phone] Fax: [phone]     Social Determinants of Health (SDOH) Interventions  Readmission Risk Interventions   No data to display

## 2022-05-15 NOTE — Progress Notes (Signed)
Mobility Specialist - Progress Note   05/15/22 1230  Mobility  Activity Ambulated with assistance in hallway  Level of Assistance Minimal assist, patient does 75% or more  Assistive Device Other (Comment) (Hallway Rails, IV Pole)  Distance Ambulated (ft) 30 ft  Activity Response Tolerated well  Mobility Referral Yes  $Mobility charge 1 Mobility   Pt was found in bed and agreeable to ambulate. Was min-A from bed to standing and was very unsteady during ambulation. Stated feeling dizzy but that it would go away with walking, it did not. He also stated feeling weak from not being up. At EOS returned to bed with all necessities and RN notified of session.  Billey Chang Mobility Specialist

## 2022-05-15 NOTE — Progress Notes (Addendum)
PROGRESS NOTE  Jermaine [name]  UKG:[id number] DOB: 03/21/1966 DOA: 05/12/2022 PCP: Farris Has, MD   Brief Narrative: Patient is a 56 year old male with history of chronic alcoholism, multiple admissions in the past for alcohol withdrawal, depression, suicidal ideation, most recently discharged on 11/30 /23  after being cleared from psychiatry presents with alcohol withdrawal, tremor, hallucination.  On presentation, he was hypertensive, tachycardic, tremulous. Alcohol level of 257.  Started  on CIWA protocol.  Currently being managed for severe alcohol withdrawal.  Assessment & Plan:  Principal Problem:   Alcohol withdrawal delirium (HCC) Active Problems:   Chronic alcohol abuse   Elevated liver enzymes   Acute alcohol withdrawal: Last admission on 11/26.  Multiple admissions in the past for alcohol withdrawal.  Reported 1 month binge.  Presented with tremor, hallucination. UDS positive for benzo, barbiturates Started on CIWA protocol.  PCCM consulted for consideration of Precedex.  As per PCCM,he does not need it for now, added phenobarbital. He looks better today but is still has tremors, looks anxious. orientation has improved today.  Chronic alcohol abuse: Reported being sober for 5 months prior to last month of binge drinking.  He was also attending AA 5 times a week, [facility]. we encourage resuming AA meetings.  Continue thiamine folic acid. TOC  consulted  Elevated liver enzymes: Chronic elevation of liver enzymes but worsened on this admission.  Continue to monitor.  Ultrasound of the liver showed hepatic steatosis.Improved and still somewhat high.  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  Suicidal ideation: Patient apparently denied this but the sister called [place] said  his friend found a noose hung in patient's house with a chair under it like patient is prepared to commit suicide.  Psychiatry consulted  Patient history of diverticulitis: Was admitted and managed for this on  last admission.States he has chronic diarrhea .  We have recommended him to follow-up with GI as an outpatient  Anxiety: on Lexapro at home  Generalized weakness: Will consult PT/OT         DVT prophylaxis:enoxaparin (LOVENOX) injection 40 mg Start: 05/12/22 2245     Code Status: Full Code  Family Communication:called and discussed with sister on phone today  Patient status:Inpatient  Patient is from :Home  Anticipated discharge JS:EGBT  Estimated DC date:1-2 days   Consultants: None  Procedures:None  Antimicrobials:  Anti-infectives (From admission, onward)    None       Subjective: Patient seen and examined at bedside today.  Looks more comfortable today more coherent.  Still has some tremors, appears anxious but significant progress from yesterday.  Looks weak.  Lying in bed.  Denies any abdomen pain, nausea or vomiting.  Objective: Vitals:   05/14/22 0801 05/14/22 0911 05/14/22 1153 05/14/22 1220  BP: (!) 125/102 119/76 (!) 119/98 (!) 136/99  Pulse: 97 (!) 107 89 88  Resp:  20 17 18   Temp:  98.5 F (36.9 C)    TempSrc:  Oral    SpO2: 96% 96% 97% 97%  Weight:      Height:        Intake/Output Summary (Last 24 hours) at 05/14/2022 1308 Last data filed at 05/14/2022 05/16/2022 Gross per 24 hour  Intake 200 ml  Output 300 ml  Net -100 ml   Filed Weights   05/12/22 2340  Weight: 88.1 kg    Examination:  General exam: Overall comfortable, not in distress, appears anxious, has tremors on hands HEENT: PERRL Respiratory system:  no wheezes or crackles  Cardiovascular system:  S1 & S2 heard, RRR.  Gastrointestinal system: Abdomen is nondistended, soft and nontender. Central nervous system: Alert and oriented Extremities: No edema, no clubbing ,no cyanosis Skin: No rashes, no ulcers,no icterus       Data Reviewed: I have personally reviewed following labs and imaging studies  CBC: Recent Labs  Lab 05/12/22 1810  WBC 9.9  HGB 17.7*  HCT 52.2*   MCV 89.7  PLT 150   Basic Metabolic Panel: Recent Labs  Lab 05/12/22 1810 05/12/22 2013 05/13/22 1037 05/14/22 0447 05/14/22 1057  NA 136  --  135 122* 137  K 3.9  --  3.8 2.5* 3.9  CL 87*  --  92* 92* 101  CO2 20*  --  28 22 26   GLUCOSE 98  --  108* 73 129*  BUN 15  --  20 10 17   CREATININE 1.10  --  1.03 0.40* 0.79  CALCIUM 8.8*  --  8.9 6.3* 9.4  MG  --  2.1  --   --   --      No results found for this or any previous visit (from the past 240 hour(s)).   Radiology Studies: Abdomen Limited RUQ (LIVER/GB)  Result Date: 05/12/2022 CLINICAL DATA:  Right upper quadrant pain EXAM: ULTRASOUND ABDOMEN LIMITED RIGHT UPPER QUADRANT COMPARISON:  CT 04/22/2022 FINDINGS: Gallbladder: No shadowing stones. Gallbladder slightly dilated. Normal wall thickness. Negative sonographic Murphy Common bile duct: Diameter: 3.3 mm Liver: Diffusely echogenic. No focal hepatic abnormality is seen portal vein is patent on color Doppler imaging with normal direction of blood flow towards the liver. Other: None. IMPRESSION: 1. Negative for gallstones or biliary dilatation. 2. Echogenic liver parenchyma suggesting hepatic steatosis. Electronically Signed   By: 05/14/2022 M.D.   On: 05/12/2022 21:04    Scheduled Meds:  enoxaparin (LOVENOX) injection  40 mg Subcutaneous QHS   escitalopram  20 mg Oral Daily   folic acid  1 mg Oral Daily   multivitamin with minerals  1 tablet Oral Daily   PHENObarbital  97.5 mg Intravenous Q8H   Followed by   [START ON 05/16/2022] PHENObarbital  65 mg Intravenous Q8H   Followed by   05/14/2022 ON 05/18/2022] PHENObarbital  32.5 mg Intravenous Q8H   thiamine  100 mg Oral Daily   Or   thiamine  100 mg Intravenous Daily   Continuous Infusions:  sodium chloride 125 mL/hr at 05/14/22 0801     LOS: 2 days   05/20/2022, MD [facility] P12/18/2023, 1:08 PM

## 2022-05-15 NOTE — Progress Notes (Signed)
Initial Nutrition Assessment  DOCUMENTATION CODES:   Not applicable  INTERVENTION:  - Continue Regular diet.  - Continue daily multivitamin, folic acid, and thiamine for history of alcoholism.  - Monitor weight trends.   NUTRITION DIAGNOSIS:   Increased nutrient needs related to chronic illness (alcoholism) as evidenced by other (comment) (need for folic acid, thiamine, and multivitamin supplementation).  GOAL:   Patient will meet greater than or equal to 90% of their needs  MONITOR:   PO intake, Weight trends  REASON FOR ASSESSMENT:   Malnutrition Screening Tool    ASSESSMENT:   56 y/o male with history of chronic alcoholism with multiple admissions for alcohol withdrawal, depression, h/o suicidal ideation, most recently 04/22/22, who presented to [facility] -ED for assistance managing his withdrawal.  Patient known to this RD, last assessed during recent admission on 11/28 and 11/29. At that time patient reported a UBW of 220# and intentional weight loss due to the starting a keto diet and trying intermittent fasting. Patient ate very well during that admission. Noted to have no/only mild depletion in some areas on NFPE last admission (not enough to code for malnutrition).  Per EMR, weight has further trended down since last admission. Overall, patient has had a 29# or 13% weight loss within the past 8 months, borderline significant. However, he has had a 5% weight loss within the past 1 month alone, which is significant. Unable to determine at this time if this is intentional or unintentional.   Patient is documented to be eating 50-100% of meals since admission.   Medications reviewed and include: Folic acid, MVI, thiamine  Labs reviewed:  K+ 3.3    NUTRITION - FOCUSED PHYSICAL EXAM: RD working remotely  Diet Order:   Diet Order             Diet regular Room service appropriate? Yes; Fluid consistency: Thin  Diet effective now                   EDUCATION  NEEDS:  Not appropriate for education at this time  Skin:  Skin Assessment: Reviewed RN Assessment  Last BM:  12/18  Height:  Ht Readings from Last 1 Encounters:  05/12/22 5\' 10"  (1.778 m)   Weight:  Wt Readings from Last 1 Encounters:  05/12/22 88.1 kg   Ideal Body Weight:  75.45 kg  BMI:  Body mass index is 27.87 kg/m.  Estimated Nutritional Needs:  Kcal:  2200-2400 kcals Protein:  90-105 grams Fluid:  >/= 2.2L    05/14/22 RD, LDN For contact information, refer to [facility].

## 2022-05-16 DIAGNOSIS — E876 Hypokalemia: Secondary | ICD-10-CM

## 2022-05-16 DIAGNOSIS — D696 Thrombocytopenia, unspecified: Secondary | ICD-10-CM | POA: Diagnosis not present

## 2022-05-16 DIAGNOSIS — F10931 Alcohol use, unspecified with withdrawal delirium: Secondary | ICD-10-CM | POA: Diagnosis not present

## 2022-05-16 DIAGNOSIS — K760 Fatty (change of) liver, not elsewhere classified: Secondary | ICD-10-CM

## 2022-05-16 DIAGNOSIS — R748 Abnormal levels of other serum enzymes: Secondary | ICD-10-CM

## 2022-05-16 DIAGNOSIS — E871 Hypo-osmolality and hyponatremia: Secondary | ICD-10-CM

## 2022-05-16 DIAGNOSIS — F10131 Alcohol abuse with withdrawal delirium: Secondary | ICD-10-CM

## 2022-05-16 DIAGNOSIS — F419 Anxiety disorder, unspecified: Secondary | ICD-10-CM

## 2022-05-16 DIAGNOSIS — F1010 Alcohol abuse, uncomplicated: Secondary | ICD-10-CM

## 2022-05-16 DIAGNOSIS — F32A Depression, unspecified: Secondary | ICD-10-CM

## 2022-05-16 LAB — COMPREHENSIVE METABOLIC PANEL
ALT: 300 U/L — ABNORMAL HIGH (ref 0–44)
AST: 244 U/L — ABNORMAL HIGH (ref 15–41)
Albumin: 3.4 g/dL — ABNORMAL LOW (ref 3.5–5.0)
Alkaline Phosphatase: 53 U/L (ref 38–126)
Anion gap: 10 (ref 5–15)
BUN: 15 mg/dL (ref 6–20)
CO2: 26 mmol/L (ref 22–32)
Calcium: 9 mg/dL (ref 8.9–10.3)
Chloride: 101 mmol/L (ref 98–111)
Creatinine, Ser: 0.7 mg/dL (ref 0.61–1.24)
GFR, Estimated: >60 mL/min (ref >60)
Glucose, Bld: 99 mg/dL (ref 70–99)
Potassium: 4.1 mmol/L (ref 3.5–5.1)
Sodium: 137 mmol/L (ref 135–145)
Total Bilirubin: 0.6 mg/dL (ref 0.3–1.2)
Total Protein: 6.5 g/dL (ref 6.5–8.1)

## 2022-05-16 LAB — CBC
HCT: 44 % (ref 39.0–52.0)
Hemoglobin: 14.3 g/dL (ref 13.0–17.0)
MCH: 30.7 pg (ref 26.0–34.0)
MCHC: 32.5 g/dL (ref 30.0–36.0)
MCV: 94.4 fL (ref 80.0–100.0)
Platelets: 78 10*3/uL — ABNORMAL LOW (ref 150–400)
RBC: 4.66 MIL/uL (ref 4.22–5.81)
RDW: 15.9 % — ABNORMAL HIGH (ref 11.5–15.5)
WBC: 5.1 10*3/uL (ref 4.0–10.5)
nRBC: 0 % (ref 0.0–0.2)

## 2022-05-16 NOTE — Evaluation (Signed)
Occupational Therapy Evaluation Patient Details Name: Jermaine [name] MRN: [id number] DOB: 1965-11-07 Today's Date: 05/16/2022   History of Present Illness Patient is a 56 year old male with history of chronic alcoholism, multiple admissions in the past for alcohol withdrawal, depression, suicidal ideation, most recently discharged on 11/30 /23 after being cleared from psychiatry presents with alcohol withdrawal, tremor, hallucination.   Clinical Impression   Pt admitted with the above diagnoses and presents with below problem list. Pt will benefit from continued acute OT to address the below listed deficits and maximize independence with basic ADLs prior to d/c home. At baseline, pt is independent with ADLs. Pt currently needs up to min guard assist with LB ADLs and functional mobility/transfers. Delayed onset of dizziness/lightheadedness with OOB activity; VSS (bp 120/85),        Recommendations for follow up therapy are one component of a multi-disciplinary discharge planning process, led by the attending physician.  Recommendations may be updated based on patient status, additional functional criteria and insurance authorization.   Follow Up Recommendations  Home health OT     Assistance Recommended at Discharge PRN  Patient can return home with the following      Functional Status Assessment  Patient has had a recent decline in their functional status and demonstrates the ability to make significant improvements in function in a reasonable and predictable amount of time.  Equipment Recommendations  None recommended by OT    Recommendations for Other Services PT consult     Precautions / Restrictions Precautions Precautions: Fall Precaution Comments: sitter present Restrictions Weight Bearing Restrictions: No      Mobility Bed Mobility Overal bed mobility: Needs Assistance Bed Mobility: Supine to Sit, Sit to Supine     Supine to sit: Supervision Sit to supine:  Supervision        Transfers Overall transfer level: Needs assistance Equipment used: None Transfers: Sit to/from Stand Sit to Stand: Supervision, Min guard           General transfer comment: to/from EOB.      Balance Overall balance assessment: Needs assistance Sitting-balance support: No upper extremity supported, Feet supported Sitting balance-Leahy Scale: Fair     Standing balance support: No upper extremity supported, During functional activity Standing balance-Leahy Scale: Fair                             ADL either performed or assessed with clinical judgement   ADL Overall ADL's : Needs assistance/impaired Eating/Feeding: Set up;Sitting   Grooming: Set up;Sitting   Upper Body Bathing: Set up;Sitting   Lower Body Bathing: Min guard;Sit to/from stand   Upper Body Dressing : Set up;Sitting   Lower Body Dressing: Min guard;Sit to/from stand   Toilet Transfer: Min guard;Ambulation   Toileting- Clothing Manipulation and Hygiene: Set up;Min guard;Sitting/lateral lean;Sit to/from stand   Tub/ Shower Transfer: Tub transfer;Min guard;Ambulation   Functional mobility during ADLs: Min guard General ADL Comments: Pt walked about 14' simulating functional mobility in his home. Needed single UE (hall railing) for standing rest break to assess bp     Vision         Perception     Praxis      Pertinent Vitals/Pain Pain Assessment Pain Assessment: Faces Faces Pain Scale: Hurts a little bit Pain Location: unspecified, grimacing with mobility Pain Descriptors / Indicators: Grimacing Pain Intervention(s): Monitored during session     Hand Dominance     Extremity/Trunk Assessment Upper Extremity  Assessment Upper Extremity Assessment: Overall WFL for tasks assessed;Generalized weakness   Lower Extremity Assessment Lower Extremity Assessment: Defer to PT evaluation       Communication Communication Communication: No difficulties    Cognition Arousal/Alertness: Awake/alert Behavior During Therapy: WFL for tasks assessed/performed Overall Cognitive Status: Impaired/Different from baseline Area of Impairment: Awareness, Safety/judgement, Problem solving                           Awareness: Emergent Problem Solving: Slow processing General Comments: A&O date was off by 2 days, "[facility]"     General Comments  BP assessed at midpint of mobility while pt c/o dizziness/lightheadedness: 120/85    Exercises     Shoulder Instructions      Home Living Family/patient expects to be discharged to:: Private residence Living Arrangements: Alone Available Help at Discharge: Friend(s);Available PRN/intermittently Type of Home: Apartment Home Access: Level entry     Home Layout: One level     Bathroom Shower/Tub: Tub/shower unit         Home Equipment: None   Additional Comments: ground floor apartment      Prior Functioning/Environment Prior Level of Function : Independent/Modified Independent               ADLs Comments: used to work in [employer] Problem List: Decreased strength;Decreased activity tolerance;Impaired balance (sitting and/or standing);Decreased cognition;Decreased knowledge of use of DME or AE;Decreased knowledge of precautions;Pain      OT Treatment/Interventions: Self-care/ADL training;Therapeutic exercise;DME and/or AE instruction;Therapeutic activities;Patient/family education;Balance training;Cognitive remediation/compensation    OT Goals(Current goals can be found in the care plan section) Acute Rehab OT Goals Patient Stated Goal: not stated OT Goal Formulation: With patient Time For Goal Achievement: 05/30/22 Potential to Achieve Goals: Good  OT Frequency: Min 2X/week    Co-evaluation              AM-PAC OT "6 Clicks" Daily Activity     Outcome Measure Help from another person eating meals?: None Help from another person taking care  of personal grooming?: None Help from another person toileting, which includes using toliet, bedpan, or urinal?: A Little Help from another person bathing (including washing, rinsing, drying)?: A Little Help from another person to put on and taking off regular upper body clothing?: None Help from another person to put on and taking off regular lower body clothing?: None 6 Click Score: 22   End of Session    Activity Tolerance: Patient limited by fatigue;Other (comment) (delayed onset of dizziness/lightheadedness with OOB activity) Patient left: in bed;with call bell/phone within reach;with nursing/sitter in room  OT Visit Diagnosis: Unsteadiness on feet (R26.81);Muscle weakness (generalized) (M62.81);Pain                Time: 4967-5916 OT Time Calculation (min): 15 min Charges:  OT General Charges $OT Visit: 1 Visit OT Evaluation $OT Eval Low Complexity: 1 Low  Raynald Kemp, OT Acute Rehabilitation Services Office: [phone]   Pilar Grammes 05/16/2022, 1:59 PM

## 2022-05-16 NOTE — Progress Notes (Signed)
PROGRESS NOTE  Jermaine [name] DVV:[id number] DOB: 09-19-1965   PCP: Farris Has, MD  Patient is from: Home.  DOA: 05/12/2022 LOS: 4  Chief complaints Chief Complaint  Patient presents with   Drug / Alcohol Assessment     Brief Narrative / Interim history: 56 year old male with history of chronic alcoholism, multiple admissions in the past for alcohol withdrawal, depression, suicidal ideation, most recently discharged on 11/30 /23 after being cleared from psychiatry presents with alcohol withdrawal, tremor, hallucination. On presentation, he was hypertensive, tachycardic, tremulous. Alcohol level of 257. Started on CIWA protocol with as needed Ativan.  PCCM consulted and started phenobarbital taper.  Subjective: Seen and examined earlier this morning.  No major events overnight of this morning.  Reports improvement in his withdrawal symptoms.  Still with some tremors.  He also reports headache, audiovisual hallucination and pins-and-needles.   Objective: Vitals:   05/16/22 0400 05/16/22 1000 05/16/22 1330 05/16/22 1451  BP: (!) 117/90 (!) 126/90 120/85 (!) 109/96  Pulse: 77 80 87 (!) 58  Resp: 12  (!) 21 18  Temp: 98.3 F (36.8 C)   97.6 F (36.4 C)  TempSrc: Oral   Oral  SpO2: 96% 95% 100% 97%  Weight:      Height:        Examination:  GENERAL: No apparent distress.  Nontoxic. HEENT: MMM.  Vision and hearing grossly intact.  NECK: Supple.  No apparent JVD.  RESP:  No IWOB.  Fair aeration bilaterally. CVS:  RRR. Heart sounds normal.  ABD/GI/GU: BS+. Abd soft, NTND.  MSK/EXT:  Moves extremities. No apparent deformity. No edema.  SKIN: no apparent skin lesion or wound NEURO: Awake, alert and oriented appropriately.  Subtotal tremors.  No apparent focal neuro deficit. PSYCH: Calm.  Reports audiovisual hallucination and pins-and-needles.  Procedures:  None  Microbiology summarized: None  Assessment and plan: Principal Problem:   Alcohol withdrawal delirium  (HCC) Active Problems:   Chronic alcohol abuse   Elevated liver enzymes   Thrombocytopenia (HCC)   Hepatic steatosis   Anxiety and depression  Acute alcohol withdrawal with complication: Reportedly sober for 5 months prior to last month of binge drinking.  He was also attending AA 5 times a week[facility].  Reports binge drinking.  Presents with tremors and order visual hallucination.  Continues to endorse tremor, audiovisual hallucination, and pins-and-needles.  UDS positive for benzos and barbiturates. -Appreciate help by PCCM-started phenobarbital taper. -Continue IVF, multivitamin, thiamine and folic acid -Encourage resuming AA meetings.  Continue thiamine folic acid. TOC  consulted   Elevated liver enzymes/hepatic steatosis: Chronic elevation of liver enzymes but worsened on this admission.  ALP and total bili within normal. -Continue monitoring   Anxiety/depression/suicidal ideation: Patient apparently denied this but the sister called [place] said  his friend found a noose hung in patient's house with a chair under it like patient is prepared to commit suicide.  It is difficult to understand in the setting of alcohol intoxication. -Psychiatry consulted on 05/15/2022. -Continue one-to-one safety sitter -Continue home meds for now   Patient history of diverticulitis: Was admitted and managed for this on last admission.States he has chronic diarrhea .  We have recommended him to follow-up with GI as an outpatient   Generalized weakness: Will consult PT/OT  Thrombocytopenia: Likely from alcohol. Recent Labs  Lab 05/12/22 1810 05/16/22 0418  PLT 150 78*  -Discontinue subcu Lovenox -SCD for VTE prophylaxis  Hypokalemia and hyponatremia: Likely from alcohol.  Resolved.  Increased nutrient needs Body  mass index is 27.87 kg/m. Nutrition Problem: Increased nutrient needs Etiology: chronic illness (alcoholism) Signs/Symptoms: other (comment) (need for folic acid, thiamine, and  multivitamin supplementation) Interventions: Refer to RD note for recommendations, MVI   DVT prophylaxis:  Place and maintain sequential compression device Start: 05/16/22 0745  Code Status: Full code Family Communication: None at bedside Level of care: Med-Surg Status is: Inpatient Remains inpatient appropriate because: Alcohol withdrawal symptoms and concern for suicidal ideation   Final disposition: TBD Consultants:  Psychiatry Pulmonology  Sch Meds:  Scheduled Meds:  escitalopram  20 mg Oral Daily   folic acid  1 mg Oral Daily   multivitamin with minerals  1 tablet Oral Daily   PHENObarbital  65 mg Intravenous Q8H   Followed by   Melene Muller ON 05/18/2022] PHENObarbital  32.5 mg Intravenous Q8H   thiamine  100 mg Oral Daily   Or   thiamine  100 mg Intravenous Daily   Continuous Infusions:  sodium chloride 75 mL/hr at 05/16/22 1028   PRN Meds:.acetaminophen, mouth rinse  Antimicrobials: Anti-infectives (From admission, onward)    None        I have personally reviewed the following labs and images: CBC: Recent Labs  Lab 05/12/22 1810 05/16/22 0418  WBC 9.9 5.1  HGB 17.7* 14.3  HCT 52.2* 44.0  MCV 89.7 94.4  PLT 150 78*   BMP &GFR Recent Labs  Lab 05/12/22 2013 05/13/22 1037 05/14/22 0447 05/14/22 1057 05/15/22 0446 05/16/22 0418  NA  --  135 122* 137 138 137  K  --  3.8 2.5* 3.9 3.3* 4.1  CL  --  92* 92* 101 108 101  CO2  --  28 22 26 23 26   GLUCOSE  --  108* 73 129* 87 99  BUN  --  20 10 17 10 15   CREATININE  --  1.03 0.40* 0.79 0.58* 0.70  CALCIUM  --  8.9 6.3* 9.4 7.6* 9.0  MG 2.1  --   --   --   --   --    Estimated Creatinine Clearance: 115.2 mL/min (by C-G formula based on SCr of 0.7 mg/dL). Liver & Pancreas: Recent Labs  Lab 05/12/22 1810 05/13/22 1037 05/14/22 0447 05/15/22 0446 05/16/22 0418  AST 545* 433* 298* 283* 244*  ALT 404* 314* 232* 267* 300*  ALKPHOS 58 47 31* 42 53  BILITOT 1.4* 1.7* 1.0 0.6 0.6  PROT 8.8* 7.2  4.6* 5.4* 6.5  ALBUMIN 5.4* 4.5 2.8* 3.2* 3.4*   No results for input(s): "LIPASE", "AMYLASE" in the last 168 hours. No results for input(s): "AMMONIA" in the last 168 hours. Diabetic: No results for input(s): "HGBA1C" in the last 72 hours. No results for input(s): "GLUCAP" in the last 168 hours. Cardiac Enzymes: No results for input(s): "CKTOTAL", "CKMB", "CKMBINDEX", "TROPONINI" in the last 168 hours. No results for input(s): "PROBNP" in the last 8760 hours. Coagulation Profile: Recent Labs  Lab 05/12/22 1855  INR 1.1   Thyroid Function Tests: No results for input(s): "TSH", "T4TOTAL", "FREET4", "T3FREE", "THYROIDAB" in the last 72 hours. Lipid Profile: No results for input(s): "CHOL", "HDL", "LDLCALC", "TRIG", "CHOLHDL", "LDLDIRECT" in the last 72 hours. Anemia Panel: No results for input(s): "VITAMINB12", "FOLATE", "FERRITIN", "TIBC", "IRON", "RETICCTPCT" in the last 72 hours. Urine analysis:    Component Value Date/Time   COLORURINE YELLOW 04/23/2022 0237   APPEARANCEUR CLEAR 04/23/2022 0237   LABSPEC >1.046 (H) 04/23/2022 0237   PHURINE 5.0 04/23/2022 0237   GLUCOSEU NEGATIVE 04/23/2022 0237  HGBUR NEGATIVE 04/23/2022 0237   BILIRUBINUR NEGATIVE 04/23/2022 0237   KETONESUR 20 (A) 04/23/2022 0237   PROTEINUR NEGATIVE 04/23/2022 0237   NITRITE NEGATIVE 04/23/2022 0237   LEUKOCYTESUR NEGATIVE 04/23/2022 0237   Sepsis Labs: Invalid input(s): "PROCALCITONIN", "LACTICIDVEN"  Microbiology: No results found for this or any previous visit (from the past 240 hour(s)).  Radiology Studies: No results found.     T.  [facility]  If 7PM-7AM, please contact night-coverage [email or web address] 05/16/2022, 4:22 PM

## 2022-05-16 NOTE — Consult Note (Addendum)
[facility] New Face-to-Face Psychiatric Evaluation   Service Date: May 16, 2022 LOS:  LOS: 4 days    Assessment  Jermaine [name] is a 56 y.o. male admitted medically for 05/12/2022  5:42 PM for EtOH withdrawal. He carries the psychiatric diagnoses of EtOH use disorder, severe and MDD and has a past medical history of  elevated LFTs and hepatic steatosis. Psychiatry was consulted for SI by Dr. Tawanna Solo.    His current presentation of suicidal statements is most consistent with known diagnosis of MDD. Sx overlap significantly with EtOH use disorder.  Prominent hopelessness on exam, recent suicidal gesture of hanging up a noose. He meets criteria for inpatient psychiatry based on the above.  On prior admission he had made suicidal statements while drunk; this gesture is new and puts him at high risk for completion if dc without treatment. He is fully compliant with medical care and desires detox. He is currently considering whether or not he wants to sign a voluntary form (but generally open to such); I would IVC if he is unwilling to go voluntarily at this time or attempts to leave the hospital AMA.   Diagnoses:  Active Hospital problems: Principal Problem:   Alcohol withdrawal delirium (HCC) Active Problems:   Chronic alcohol abuse   Elevated liver enzymes   Thrombocytopenia (HCC)   Hepatic steatosis   Anxiety and depression     Plan  ## Safety and Observation Level:  - Based on my clinical evaluation, I estimate the patient to be at high risk of self harm in the current setting - At this time, we recommend a 1:1 level of observation. This decision is based on my review of the chart including patient's history and current presentation, interview of the patient, mental status examination, and consideration of suicide risk including evaluating suicidal ideation, plan, intent, suicidal or self-harm behaviors, risk factors, and protective factors. This judgment is based  on our ability to directly address suicide risk, implement suicide prevention strategies and develop a safety plan while the patient is in the clinical setting. Please contact our team if there is a concern that risk level has changed.   ## Medications:  -- hold changes - all psychotropics lower seizure threshold in this pt with hx complicated w/d - OK to continue home lexapro   ## Medical Decision Making Capacity:  Not formally assessed  ## Further Work-up:  -- none currently    -- most recent EKG on 11/26 had QtC of 479 in setting of sinus tach -- Pertinent labwork reviewed earlier this admission includes: EtOH 257  Recent B1, B12, folate OK  ## Disposition:  -- inpt psych when medically clear  ##Legal Status -- currently vol, would IVC if attempts to leave  Thank you for this consult request. Recommendations have been communicated to the primary team.  We will continue to follow at this time.   [place] [facility]    New history  Relevant Aspects of Hospital Course:  Admitted on 05/12/2022 for EtOH withdrawal.  Patient Report:  Pt seen in late afternoon. Discussed circumstances leading to consult. He dismisses the noose as a "fantasy". Makes a concerning statement about "I was thinking about killing myself when I ran out of money" (recently lost his job). Later reveals he had stopped drinking [employer] because he ran out of money. Makes several more concerning statements along the lines of "nothing you can do will help this.    He thinks this withdrawal is a little  rougher than last time because he is was drinking more for a longer period of time. Discussed prior attempts as sobriety - has made it to a year with assistance from [facility] before. He stopped calling his sponsor about a week ago. The main reason he didn't go to inpt psychiatry voluntarily last admission is that he was on the verge of losing his job, which he has since lost. He is willing  to consider going on a voluntary basis now.   Fully alert, oriented, DOWB with no issue.   ROS:  Mild alcoholic hallucinosis (visual) Denied SI, HI Sleeping and eating well here, poorly before admission  Collateral information:  Did not call sister but got permission to call  Psychiatric History:  Information collected from pt, medical record Alcohol withdrawal delirium, depression, anxiety.  Currently compliant with Lexapro p.o. daily from primary care provider.  Denies any history of suicidal attempt, denies suicidal self-injurious behavior.  He denies any history and or recent use of illicit substances.  He denies any current legal charges.    Social History:  Newly unemployed  Tobacco use: no Alcohol use: yes Drug use: no  Family History:  The patient's family history is not on file. He was adopted.  Medical History: Past Medical History:  Diagnosis Date   Alcohol abuse    Anxiety    Class 1 obesity 09/14/2021   Depression     Surgical History: Past Surgical History:  Procedure Laterality Date   HERNIA REPAIR      Medications:   Current Facility-Administered Medications:    0.9 %  sodium chloride infusion, , Intravenous, Continuous, Adhikari, Amrit, MD, Last Rate: 75 mL/hr at 05/16/22 1028, New Bag at 05/16/22 1028   acetaminophen (TYLENOL) tablet 650 mg, 650 mg, Oral, Q6H PRN, Burnadette Pop, MD, 650 mg at 05/16/22 0858   escitalopram (LEXAPRO) tablet 20 mg, 20 mg, Oral, Daily, Adhikari, Amrit, MD, 20 mg at 05/16/22 1044   folic acid (FOLVITE) tablet 1 mg, 1 mg, Oral, Daily, Norins, Rosalyn Gess, MD, 1 mg at 05/16/22 1044   multivitamin with minerals tablet 1 tablet, 1 tablet, Oral, Daily, Norins, Rosalyn Gess, MD, 1 tablet at 05/16/22 1044   Oral care mouth rinse, 15 mL, Mouth Rinse, PRN, Norins, Rosalyn Gess, MD   [COMPLETED] PHENObarbital (LUMINAL) injection 97.5 mg, 97.5 mg, Intravenous, Q8H, 97.5 mg at 05/16/22 1202 **FOLLOWED BY** PHENObarbital (LUMINAL) injection  65 mg, 65 mg, Intravenous, Q8H **FOLLOWED BY** [START ON 05/18/2022] PHENObarbital (LUMINAL) injection 32.5 mg, 32.5 mg, Intravenous, Q8H, Adhikari, Amrit, MD   thiamine (VITAMIN B1) tablet 100 mg, 100 mg, Oral, Daily, 100 mg at 05/16/22 1043 **OR** thiamine (VITAMIN B1) injection 100 mg, 100 mg, Intravenous, Daily, Roemhildt, Lorin T, PA-C  Allergies: No Known Allergies     Objective  Vital signs:  Temp:  [97.6 F (36.4 C)-98.3 F (36.8 C)] 97.6 F (36.4 C) (12/20 1451) Pulse Rate:  [58-87] 58 (12/20 1451) Resp:  [12-22] 18 (12/20 1451) BP: (106-126)/(75-96) 109/96 (12/20 1451) SpO2:  [94 %-100 %] 97 % (12/20 1451)  Psychiatric Specialty Exam:  Presentation  General Appearance:  Appropriate for Environment; Casual  Eye Contact: Good  Speech: Clear and Coherent; Normal Rate  Speech Volume: Normal  Handedness: Right   Mood and Affect  Mood: Euthymic  Affect: Appropriate; Congruent   Thought Process  Thought Processes: Coherent; Goal Directed  Descriptions of Associations:Intact  Orientation:Full (Time, Place and Person)  Thought Content:-- (Overly negative/hopeless)  History of Schizophrenia/Schizoaffective disorder:No  data recorded Duration of Psychotic Symptoms:No data recorded Hallucinations:Hallucinations: -- (visual)  Ideas of Reference:None  Suicidal Thoughts:Suicidal Thoughts: No (I believe he is downplaying this.)  Homicidal Thoughts:Homicidal Thoughts: No   Sensorium  Memory: Immediate Good; Recent Good; Remote Good  Judgment: Poor  Insight: Poor   Executive Functions  Concentration: Fair  Attention Span: Good  Recall: Good  Fund of Knowledge: Good  Language: Good   Psychomotor Activity  Psychomotor Activity:Psychomotor Activity: Normal (no tremor observed on my exam)   Assets  Assets: Communication Skills   Sleep  Sleep:Sleep: Good    Physical Exam: Physical Exam ROS Blood pressure (!) 109/96,  pulse (!) 58, temperature 97.6 F (36.4 C), temperature source Oral, resp. rate 18, height 5\' 10"  (1.778 m), weight 88.1 kg, SpO2 97 %. Body mass index is 27.87 kg/m.

## 2022-05-16 NOTE — Plan of Care (Signed)

## 2022-05-16 NOTE — Progress Notes (Signed)
PT Cancellation Note  Patient Details Name: Jermaine [name] MRN: [id number] DOB: 1965-05-29   Cancelled Treatment:     Pt had been up with Mobility specialist yesterday and also OT today. Nurse reports pt has been up in room with assistance as well. Pt sleeping at this time. Will check back if time allows.    Marella Bile 05/16/2022, 6:32 PM Clois Dupes, PT, MPT Acute Rehabilitation Services Office: [phone] If a weekend: [facility] w/e pager [phone] 05/16/2022

## 2022-05-17 DIAGNOSIS — R45851 Suicidal ideations: Secondary | ICD-10-CM

## 2022-05-17 DIAGNOSIS — Z046 Encounter for general psychiatric examination, requested by authority: Secondary | ICD-10-CM

## 2022-05-17 DIAGNOSIS — D696 Thrombocytopenia, unspecified: Secondary | ICD-10-CM | POA: Diagnosis not present

## 2022-05-17 DIAGNOSIS — F10931 Alcohol use, unspecified with withdrawal delirium: Secondary | ICD-10-CM | POA: Diagnosis not present

## 2022-05-17 DIAGNOSIS — F419 Anxiety disorder, unspecified: Secondary | ICD-10-CM | POA: Diagnosis not present

## 2022-05-17 DIAGNOSIS — F1010 Alcohol abuse, uncomplicated: Secondary | ICD-10-CM | POA: Diagnosis not present

## 2022-05-17 LAB — COMPREHENSIVE METABOLIC PANEL
ALT: 257 U/L — ABNORMAL HIGH (ref 0–44)
AST: 164 U/L — ABNORMAL HIGH (ref 15–41)
Albumin: 3.5 g/dL (ref 3.5–5.0)
Alkaline Phosphatase: 51 U/L (ref 38–126)
Anion gap: 8 (ref 5–15)
BUN: 17 mg/dL (ref 6–20)
CO2: 27 mmol/L (ref 22–32)
Calcium: 8.9 mg/dL (ref 8.9–10.3)
Chloride: 102 mmol/L (ref 98–111)
Creatinine, Ser: 0.81 mg/dL (ref 0.61–1.24)
GFR, Estimated: >60 mL/min (ref >60)
Glucose, Bld: 96 mg/dL (ref 70–99)
Potassium: 4.2 mmol/L (ref 3.5–5.1)
Sodium: 137 mmol/L (ref 135–145)
Total Bilirubin: 0.7 mg/dL (ref 0.3–1.2)
Total Protein: 6.2 g/dL — ABNORMAL LOW (ref 6.5–8.1)

## 2022-05-17 LAB — CK: Total CK: 62 U/L (ref 49–397)

## 2022-05-17 LAB — CBC
HCT: 46.2 % (ref 39.0–52.0)
Hemoglobin: 15 g/dL (ref 13.0–17.0)
MCH: 30.7 pg (ref 26.0–34.0)
MCHC: 32.5 g/dL (ref 30.0–36.0)
MCV: 94.7 fL (ref 80.0–100.0)
Platelets: 88 10*3/uL — ABNORMAL LOW (ref 150–400)
RBC: 4.88 MIL/uL (ref 4.22–5.81)
RDW: 15.8 % — ABNORMAL HIGH (ref 11.5–15.5)
WBC: 5.1 10*3/uL (ref 4.0–10.5)
nRBC: 0 % (ref 0.0–0.2)

## 2022-05-17 LAB — PHOSPHORUS: Phosphorus: 3.7 mg/dL (ref 2.5–4.6)

## 2022-05-17 LAB — MAGNESIUM: Magnesium: 2.2 mg/dL (ref 1.7–2.4)

## 2022-05-17 MED ORDER — LORAZEPAM 1 MG PO TABS
1.0000 mg | ORAL_TABLET | ORAL | Status: DC | PRN
  Administered 2022-05-17: 2 mg via ORAL
  Administered 2022-05-17: 3 mg via ORAL
  Administered 2022-05-17: 2 mg via ORAL
  Administered 2022-05-17: 3 mg via ORAL
  Administered 2022-05-18: 2 mg via ORAL
  Administered 2022-05-18: 3 mg via ORAL
  Administered 2022-05-18: 2 mg via ORAL
  Administered 2022-05-18 (×2): 3 mg via ORAL
  Administered 2022-05-19: 2 mg via ORAL
  Administered 2022-05-19: 3 mg via ORAL
  Administered 2022-05-19: 2 mg via ORAL
  Administered 2022-05-19: 3 mg via ORAL
  Administered 2022-05-20 (×2): 2 mg via ORAL
  Filled 2022-05-17: qty 2
  Filled 2022-05-17 (×4): qty 3
  Filled 2022-05-17 (×2): qty 2
  Filled 2022-05-17: qty 3
  Filled 2022-05-17: qty 2
  Filled 2022-05-17: qty 3
  Filled 2022-05-17 (×3): qty 2
  Filled 2022-05-17: qty 3
  Filled 2022-05-17: qty 2

## 2022-05-17 MED ORDER — LORAZEPAM 2 MG/ML IJ SOLN
1.0000 mg | INTRAMUSCULAR | Status: DC | PRN
  Administered 2022-05-17: 2 mg via INTRAVENOUS
  Administered 2022-05-17 – 2022-05-18 (×2): 3 mg via INTRAVENOUS
  Filled 2022-05-17 (×2): qty 2
  Filled 2022-05-17: qty 1

## 2022-05-17 NOTE — Consult Note (Signed)
[facility] Face-to-Face Psychiatry Consult   Reason for Consult: Alcohol withdrawal, possible suicide attempt Referring Physician:  Dr. Nelson Chimes Patient Identification: Jermaine [name] MRN:  [id number] Principal Diagnosis: Alcohol withdrawal delirium (HCC) Diagnosis:  Principal Problem:   Alcohol withdrawal delirium (HCC) Active Problems:   Chronic alcohol abuse   Elevated liver enzymes   Thrombocytopenia (HCC)   Hepatic steatosis   Anxiety and depression   Total Time spent with patient: 1 hour  Subjective:   Jermaine [name] is a 56 y.o. male patient admitted with alcohol withdrawal. On evaluation patient is seen in assessed, chart review, consulted with Dr. Gasper Sells and primary team.  Patient was observed to be sitting upright in bed, eating breakfast.  He had noticeable improvement in his clinical presentation, and was very appropriate throughout psychiatric reassessment.  Patient did continue to endorse ongoing suicidal thoughts, however cites that he is a coward and likely would never proceed. He continues to make parasuicidal statements "if someone wants to die just let them die. No one should intervene in it." He reports his mood is "ok", and his affect is congruent. Patient reports he is not open to going inpatient psychiatric hospitalization. We did discuss the noose and chair at his home. He states "the noose is there for me to look at everyday and not to hang myself. Its been there, they just now seeing it." He has no insight into his alcohol use and correlation to increase in impulsivity and suicidality.    Today he is experiencing some shakes and tremors, however much improved as reported from staff.  He denies any side effects or adverse reactions from his Lexapro at this time. He continues to deny suicidality, homicidality, and or auditory or visual hallucinations.   HPI:  Jermaine [name] is a 56 y.o. male admitted medically for 05/12/2022  5:42 PM for EtOH withdrawal. He carries the  psychiatric diagnoses of EtOH use disorder, severe and MDD and has a past medical history of  elevated LFTs and hepatic steatosis. Psychiatry was consulted for SI by Dr. Renford Dills.      Past Psychiatric History: Alcohol withdrawal delirium, depression, anxiety.  Currently compliant with Lexapro p.o. daily from primary care provider.  Denies any history of suicidal attempt, denies suicidal self-injurious behavior.  He denies any history and or recent use of illicit substances.  He denies any current legal charges.  Risk to Self:  yes Risk to Others:  Denies Prior Inpatient Therapy:  Denies Prior Outpatient Therapy:  Denies  Past Medical History:  Past Medical History:  Diagnosis Date   Alcohol abuse    Anxiety    Class 1 obesity 09/14/2021   Depression     Past Surgical History:  Procedure Laterality Date   HERNIA REPAIR     Family History:  Family History  Adopted: Yes   Family Psychiatric  History:  Social History:  Social History   Substance and Sexual Activity  Alcohol Use Yes   Alcohol/week: 20.0 standard drinks of alcohol   Types: 20 Standard drinks or equivalent per week     Social History   Substance and Sexual Activity  Drug Use Not Currently    Social History   Socioeconomic History   Marital status: Single    Spouse name: Not on file   Number of children: Not on file   Years of education: Not on file   Highest education level: Not on file  Occupational History   Not on file  Tobacco Use   Smoking status:  Never   Smokeless tobacco: Never  Vaping Use   Vaping Use: Never used  Substance and Sexual Activity   Alcohol use: Yes    Alcohol/week: 20.0 standard drinks of alcohol    Types: 20 Standard drinks or equivalent per week   Drug use: Not Currently   Sexual activity: Not on file  Other Topics Concern   Not on file  Social History Narrative   Not on file   Social Determinants of Health   Financial Resource Strain: Not on file  Food Insecurity:  No Food Insecurity (04/23/2022)   Hunger Vital Sign    Worried About Running Out of Food in the Last Year: Never true    Ran Out of Food in the Last Year: Never true  Transportation Needs: No Transportation Needs (05/13/2022)   PRAPARE - Administrator, Civil Service (Medical): No    Lack of Transportation (Non-Medical): No  Physical Activity: Not on file  Stress: Not on file  Social Connections: Not on file   Additional Social History:    Allergies:  No Known Allergies  Labs:  Results for orders placed or performed during the hospital encounter of 05/12/22 (from the past 48 hour(s))  Comprehensive metabolic panel     Status: Abnormal   Collection Time: 05/16/22  4:18 AM  Result Value Ref Range   Sodium 137 135 - 145 mmol/L   Potassium 4.1 3.5 - 5.1 mmol/L   Chloride 101 98 - 111 mmol/L   CO2 26 22 - 32 mmol/L   Glucose, Bld 99 70 - 99 mg/dL    Comment: Glucose reference range applies only to samples taken after fasting for at least 8 hours.   BUN 15 6 - 20 mg/dL   Creatinine, Ser 1.61 0.61 - 1.24 mg/dL   Calcium 9.0 8.9 - 09.6 mg/dL   Total Protein 6.5 6.5 - 8.1 g/dL   Albumin 3.4 (L) 3.5 - 5.0 g/dL   AST 045 (H) 15 - 41 U/L   ALT 300 (H) 0 - 44 U/L   Alkaline Phosphatase 53 38 - 126 U/L   Total Bilirubin 0.6 0.3 - 1.2 mg/dL   GFR, Estimated >40 >98 mL/min    Comment: (NOTE) Calculated using the CKD-EPI Creatinine Equation (2021)    Anion gap 10 5 - 15    Comment: Performed at [facility], [address] [place], [place] [address]  CBC     Status: Abnormal   Collection Time: 05/16/22  4:18 AM  Result Value Ref Range   WBC 5.1 4.0 - 10.5 K/uL   RBC 4.66 4.22 - 5.81 MIL/uL   Hemoglobin 14.3 13.0 - 17.0 g/dL   HCT 78.2 95.6 - 21.3 %   MCV 94.4 80.0 - 100.0 fL   MCH 30.7 26.0 - 34.0 pg   MCHC 32.5 30.0 - 36.0 g/dL   RDW 08.6 (H) 57.8 - 46.9 %   Platelets 78 (L) 150 - 400 K/uL    Comment: SPECIMEN CHECKED FOR CLOTS Immature Platelet  Fraction may be clinically indicated, consider ordering this additional test [id number] REPEATED TO VERIFY    nRBC 0.0 0.0 - 0.2 %    Comment: Performed at [facility], [address] [place], [place] [address]  Phosphorus     Status: None   Collection Time: 05/17/22  3:52 AM  Result Value Ref Range   Phosphorus 3.7 2.5 - 4.6 mg/dL    Comment: Performed at [facility], [address] [place], [place] [address]  Magnesium     Status: None   Collection Time: 05/17/22  3:52 AM  Result Value Ref Range   Magnesium 2.2 1.7 - 2.4 mg/dL    Comment: Performed at [facility], [address] [place], [place] [address]  Comprehensive metabolic panel     Status: Abnormal   Collection Time: 05/17/22  3:52 AM  Result Value Ref Range   Sodium 137 135 - 145 mmol/L   Potassium 4.2 3.5 - 5.1 mmol/L   Chloride 102 98 - 111 mmol/L   CO2 27 22 - 32 mmol/L   Glucose, Bld 96 70 - 99 mg/dL    Comment: Glucose reference range applies only to samples taken after fasting for at least 8 hours.   BUN 17 6 - 20 mg/dL   Creatinine, Ser 1.470.81 0.61 - 1.24 mg/dL   Calcium 8.9 8.9 - 82.910.3 mg/dL   Total Protein 6.2 (L) 6.5 - 8.1 g/dL   Albumin 3.5 3.5 - 5.0 g/dL   AST 562164 (H) 15 - 41 U/L   ALT 257 (H) 0 - 44 U/L   Alkaline Phosphatase 51 38 - 126 U/L   Total Bilirubin 0.7 0.3 - 1.2 mg/dL   GFR, Estimated >13>60 >08>60 mL/min    Comment: (NOTE) Calculated using the CKD-EPI Creatinine Equation (2021)    Anion gap 8 5 - 15    Comment: Performed at [facility], [address] [place], [place] [address]  CK     Status: None   Collection Time: 05/17/22  3:52 AM  Result Value Ref Range   Total CK 62 49 - 397 U/L    Comment: Performed at [facility], [address] [place], [place] [address]  CBC     Status: Abnormal   Collection Time: 05/17/22  3:52 AM  Result Value Ref Range   WBC 5.1 4.0 - 10.5 K/uL   RBC 4.88  4.22 - 5.81 MIL/uL   Hemoglobin 15.0 13.0 - 17.0 g/dL   HCT 52.846.2 41.339.0 - 24.452.0 %   MCV 94.7 80.0 - 100.0 fL   MCH 30.7 26.0 - 34.0 pg   MCHC 32.5 30.0 - 36.0 g/dL   RDW 01.015.8 (H) 27.211.5 - 53.615.5 %   Platelets 88 (L) 150 - 400 K/uL    Comment: Immature Platelet Fraction may be clinically indicated, consider ordering this additional test [id number]    nRBC 0.0 0.0 - 0.2 %    Comment: Performed at [facility], [address] [place], [place] [address]    Current Facility-Administered Medications  Medication Dose Route Frequency Provider Last Rate Last Admin   0.9 %  sodium chloride infusion   Intravenous Continuous Burnadette PopAdhikari, Amrit, MD 75 mL/hr at 05/17/22 1332 New Bag at 05/17/22 1332   acetaminophen (TYLENOL) tablet 650 mg  650 mg Oral Q6H PRN Burnadette PopAdhikari, Amrit, MD   650 mg at 05/17/22 0043   escitalopram (LEXAPRO) tablet 20 mg  20 mg Oral Daily Burnadette PopAdhikari, Amrit, MD   20 mg at 05/17/22 0951   folic acid (FOLVITE) tablet 1 mg  1 mg Oral Daily Norins, Rosalyn GessMichael E, MD   1 mg at 05/17/22 0951   LORazepam (ATIVAN) tablet 1-4 mg  1-4 mg Oral Q1H PRN Candelaria StagersGonfa, Taye T, MD   2 mg at 05/17/22 1418   Or   LORazepam (ATIVAN) injection 1-4 mg  1-4 mg Intravenous Q1H PRN Gonfa, Taye T,  MD   3 mg at 05/17/22 0949   multivitamin with minerals tablet 1 tablet  1 tablet Oral Daily Norins, Rosalyn Gess, MD   1 tablet at 05/17/22 7371   Oral care mouth rinse  15 mL Mouth Rinse PRN Norins, Rosalyn Gess, MD       PHENObarbital (LUMINAL) injection 65 mg  65 mg Intravenous Q8H Adhikari, Willia Craze, MD   65 mg at 05/17/22 1333   Followed by   Melene Muller ON 05/18/2022] PHENObarbital (LUMINAL) injection 32.5 mg  32.5 mg Intravenous Q8H Adhikari, Amrit, MD       thiamine (VITAMIN B1) tablet 100 mg  100 mg Oral Daily Roemhildt, Lorin T, PA-C   100 mg at 05/17/22 0626   Or   thiamine (VITAMIN B1) injection 100 mg  100 mg Intravenous Daily Roemhildt, Lorin T, PA-C        Musculoskeletal: Strength & Muscle Tone: within  normal limits Gait & Station: normal Patient leans: N/A            Psychiatric Specialty Exam:  Presentation  General Appearance:  Appropriate for Environment; Casual  Eye Contact: Good  Speech: Clear and Coherent; Normal Rate  Speech Volume: Normal  Handedness: Right   Mood and Affect  Mood: Euthymic  Affect: Appropriate; Congruent   Thought Process  Thought Processes: Coherent; Goal Directed  Descriptions of Associations:Intact  Orientation:Full (Time, Place and Person)  Thought Content:-- (Overly negative/hopeless)  History of Schizophrenia/Schizoaffective disorder:No data recorded Duration of Psychotic Symptoms:No data recorded Hallucinations:Hallucinations: -- (visual)  Ideas of Reference:None  Suicidal Thoughts:Suicidal Thoughts: No (I believe he is downplaying this.)  Homicidal Thoughts:Homicidal Thoughts: No   Sensorium  Memory: Immediate Good; Recent Good; Remote Good  Judgment: Poor  Insight: Poor   Executive Functions  Concentration: Fair  Attention Span: Good  Recall: Good  Fund of Knowledge: Good  Language: Good   Psychomotor Activity  Psychomotor Activity: Psychomotor Activity: Normal (no tremor observed on my exam)   Assets  Assets: Communication Skills   Sleep  Sleep: Sleep: Good   Physical Exam: Physical Exam Vitals and nursing note reviewed.  Constitutional:      Appearance: Normal appearance. He is normal weight.  Skin:    General: Skin is warm.     Capillary Refill: Capillary refill takes less than 2 seconds.  Neurological:     General: No focal deficit present.     Mental Status: He is alert and oriented to person, place, and time. Mental status is at baseline.  Psychiatric:        Mood and Affect: Mood normal.        Behavior: Behavior normal.        Thought Content: Thought content normal.        Judgment: Judgment normal.    Review of Systems  Psychiatric/Behavioral:   Positive for substance abuse (alcohol use). Negative for depression, hallucinations, memory loss and suicidal ideas. The patient is nervous/anxious and has insomnia.   All other systems reviewed and are negative.  Blood pressure (!) 128/95, pulse 76, temperature 97.9 F (36.6 C), temperature source Oral, resp. rate 18, height 5\' 10"  (1.778 m), weight 88.1 kg, SpO2 97 %. Body mass index is 27.87 kg/m.  Treatment Plan Summary: Daily contact with patient to assess and evaluate symptoms and progress in treatment and Medication management  Will continue lexapro.  -Will initiate IVC papers at this time, patient is approaching day 5 of detox and will be medically stable soon.  -Continue alcohol/CIWA  detox protocol at this time. Historically this has been effective for patient. Labs reviewed on admission include blood alcohol level of 257.   Psychiatric consult service will continue to follow, above findings have been discussed with hospitalist Dr. Alanda Slim.  Disposition: No evidence of imminent risk to self or others at present.   Patient does not meet criteria for psychiatric inpatient admission. Supportive therapy provided about ongoing stressors. Refer to IOP.  Maryagnes Amos, FNP 05/17/2022 3:10 PM

## 2022-05-17 NOTE — Plan of Care (Signed)

## 2022-05-17 NOTE — Progress Notes (Signed)
PROGRESS NOTE  Jermaine [name] KPT:[id number] DOB: 16-Sep-1965   PCP: Farris Has, MD  Patient is from: Home.  DOA: 05/12/2022 LOS: 5  Chief complaints Chief Complaint  Patient presents with   Drug / Alcohol Assessment     Brief Narrative / Interim history: 56 year old male with history of chronic alcoholism, multiple admissions in the past for alcohol withdrawal, depression, suicidal ideation, most recently discharged on 11/30 /23 after being cleared from psychiatry presents with alcohol withdrawal, tremor, hallucination. On presentation, he was hypertensive, tachycardic, tremulous. Alcohol level of 257. Started on CIWA protocol with as needed Ativan.  PCCM consulted and started phenobarbital taper.  There was concern about suicidal ideation and psychiatry was consulted.  Psychiatry recommended IVC and inpatient psych hospitalization once medically cleared.  Subjective: Seen and examined earlier this morning.  No major events overnight of this morning.  Reports tremor, shakes and audiovisual hallucination.  No other complaints.  Objective: Vitals:   05/17/22 0927 05/17/22 1044 05/17/22 1240 05/17/22 1415  BP: 122/88 119/71 96/62 (!) 128/95  Pulse: 93 83 83 76  Resp: 16 16 15 18   Temp: (!) 97.3 F (36.3 C) 98.3 F (36.8 C) 98.9 F (37.2 C) 97.9 F (36.6 C)  TempSrc: Oral Oral Oral Oral  SpO2: 96% 97% 98% 97%  Weight:      Height:        Examination:  GENERAL: No apparent distress.  Nontoxic. HEENT: MMM.  Vision and hearing grossly intact.  NECK: Supple.  No apparent JVD.  RESP:  No IWOB.  Fair aeration bilaterally. CVS:  RRR. Heart sounds normal.  ABD/GI/GU: BS+. Abd soft, NTND.  MSK/EXT:  Moves extremities. No apparent deformity. No edema.  SKIN: no apparent skin lesion or wound NEURO: Awake and alert. Oriented appropriately.  Notable tremors.  No apparent focal neuro deficit. PSYCH: Calm.  Reports audiovisual hallucination.  Procedures:  None  Microbiology  summarized: None  Assessment and plan: Principal Problem:   Alcohol withdrawal delirium (HCC) Active Problems:   Chronic alcohol abuse   Elevated liver enzymes   Thrombocytopenia (HCC)   Hepatic steatosis   Anxiety and depression  Acute alcohol withdrawal with complication: Reportedly sober for 5 months prior to last month of binge drinking.  He was also attending AA 5 times a week, [facility].  Reports binge drinking.  Presents with tremors and order visual hallucination.  Continues to endorse tremor, audiovisual hallucination, and pins-and-needles.  UDS positive for benzos and barbiturates. -Appreciate help by PCCM-started phenobarbital taper. -Continue IVF, multivitamin, thiamine and folic acid -Encourage resuming AA meetings.  Continue thiamine folic acid. TOC  consulted   Elevated liver enzymes/hepatic steatosis: Chronic elevation of liver enzymes but worsened on this admission.  ALP and total bili within normal.  Improving. -Continue monitoring   Anxiety/depression/suicidal ideation: "Patient denied this but the sister called said  his friend found a noose hung in patient's house with a chair under it like patient is prepared to commit suicide".  Psych reconsulted and recommended IVC and inpatient psychiatric hospitalization once medically clear. -IVC paperwork signed by psychiatry and faxed to magistrate -Continue one-to-one safety sitter -Continue home meds for now   Patient history of diverticulitis: Was admitted and managed for this on last admission.States he has chronic diarrhea .  -Outpatient follow-up with GI for colonoscopy to assess burden of diverticulosis   Generalized weakness:  -PT/OT  Thrombocytopenia: Likely from alcohol.  Improving. Recent Labs  Lab 05/12/22 1810 05/16/22 0418 05/17/22 0352  PLT 150 78* 88*  -  SCD for VTE prophylaxis  Hypokalemia and hyponatremia: Likely from alcohol.  Resolved.  Increased nutrient needs Body mass index is 27.87  kg/m. Nutrition Problem: Increased nutrient needs Etiology: chronic illness (alcoholism) Signs/Symptoms: other (comment) (need for folic acid, thiamine, and multivitamin supplementation) Interventions: Refer to RD note for recommendations, MVI   DVT prophylaxis:  Place and maintain sequential compression device Start: 05/16/22 0745  Code Status: Full code Family Communication: None at bedside Level of care: Med-Surg Status is: Inpatient Remains inpatient appropriate because: Alcohol withdrawal, suicidal ideation   Final disposition: Inpatient psychiatric hospital Consultants:  Psychiatry Pulmonology  Sch Meds:  Scheduled Meds:  escitalopram  20 mg Oral Daily   folic acid  1 mg Oral Daily   multivitamin with minerals  1 tablet Oral Daily   PHENObarbital  65 mg Intravenous Q8H   Followed by   Melene Muller ON 05/18/2022] PHENObarbital  32.5 mg Intravenous Q8H   thiamine  100 mg Oral Daily   Or   thiamine  100 mg Intravenous Daily   Continuous Infusions:  sodium chloride 75 mL/hr at 05/17/22 1332   PRN Meds:.acetaminophen, LORazepam **OR** LORazepam, mouth rinse  Antimicrobials: Anti-infectives (From admission, onward)    None        I have personally reviewed the following labs and images: CBC: Recent Labs  Lab 05/12/22 1810 05/16/22 0418 05/17/22 0352  WBC 9.9 5.1 5.1  HGB 17.7* 14.3 15.0  HCT 52.2* 44.0 46.2  MCV 89.7 94.4 94.7  PLT 150 78* 88*   BMP &GFR Recent Labs  Lab 05/12/22 2013 05/13/22 1037 05/14/22 0447 05/14/22 1057 05/15/22 0446 05/16/22 0418 05/17/22 0352  NA  --    < > 122* 137 138 137 137  K  --    < > 2.5* 3.9 3.3* 4.1 4.2  CL  --    < > 92* 101 108 101 102  CO2  --    < > 22 26 23 26 27   GLUCOSE  --    < > 73 129* 87 99 96  BUN  --    < > 10 17 10 15 17   CREATININE  --    < > 0.40* 0.79 0.58* 0.70 0.81  CALCIUM  --    < > 6.3* 9.4 7.6* 9.0 8.9  MG 2.1  --   --   --   --   --  2.2  PHOS  --   --   --   --   --   --  3.7   < > =  values in this interval not displayed.   Estimated Creatinine Clearance: 113.8 mL/min (by C-G formula based on SCr of 0.81 mg/dL). Liver & Pancreas: Recent Labs  Lab 05/13/22 1037 05/14/22 0447 05/15/22 0446 05/16/22 0418 05/17/22 0352  AST 433* 298* 283* 244* 164*  ALT 314* 232* 267* 300* 257*  ALKPHOS 47 31* 42 53 51  BILITOT 1.7* 1.0 0.6 0.6 0.7  PROT 7.2 4.6* 5.4* 6.5 6.2*  ALBUMIN 4.5 2.8* 3.2* 3.4* 3.5   No results for input(s): "LIPASE", "AMYLASE" in the last 168 hours. No results for input(s): "AMMONIA" in the last 168 hours. Diabetic: No results for input(s): "HGBA1C" in the last 72 hours. No results for input(s): "GLUCAP" in the last 168 hours. Cardiac Enzymes: Recent Labs  Lab 05/17/22 0352  CKTOTAL 62   No results for input(s): "PROBNP" in the last 8760 hours. Coagulation Profile: Recent Labs  Lab 05/12/22 1855  INR 1.1  Thyroid Function Tests: No results for input(s): "TSH", "T4TOTAL", "FREET4", "T3FREE", "THYROIDAB" in the last 72 hours. Lipid Profile: No results for input(s): "CHOL", "HDL", "LDLCALC", "TRIG", "CHOLHDL", "LDLDIRECT" in the last 72 hours. Anemia Panel: No results for input(s): "VITAMINB12", "FOLATE", "FERRITIN", "TIBC", "IRON", "RETICCTPCT" in the last 72 hours. Urine analysis:    Component Value Date/Time   COLORURINE YELLOW 04/23/2022 0237   APPEARANCEUR CLEAR 04/23/2022 0237   LABSPEC >1.046 (H) 04/23/2022 0237   PHURINE 5.0 04/23/2022 0237   GLUCOSEU NEGATIVE 04/23/2022 0237   HGBUR NEGATIVE 04/23/2022 0237   BILIRUBINUR NEGATIVE 04/23/2022 0237   KETONESUR 20 (A) 04/23/2022 0237   PROTEINUR NEGATIVE 04/23/2022 0237   NITRITE NEGATIVE 04/23/2022 0237   LEUKOCYTESUR NEGATIVE 04/23/2022 0237   Sepsis Labs: Invalid input(s): "PROCALCITONIN", "LACTICIDVEN"  Microbiology: No results found for this or any previous visit (from the past 240 hour(s)).  Radiology Studies: No results found.     T.  [facility]  Hospitalist  If 7PM-7AM, please contact night-coverage [email or web address] 05/17/2022, 2:42 PM

## 2022-05-17 NOTE — TOC Progression Note (Addendum)
Transition of Care [facility]) - Progression Note    Patient Details  Name: Omid Deardorff MRN: [id number] Date of Birth: [birth year]  Transition of Care [facility] CM/SW Contact  Golda Acre, RN Phone Number: 05/17/2022, 9:30 AM  Clinical Narrative:    Ivc papers completed and faxed to the magistrate. [id number] paper received per magistrate office [id number] nonemergency communication for police called to have patient served.  Papers x4 copies to the nurses station.   Barriers to Discharge: Continued Medical Work up  Expected Discharge Plan and Services   Discharge Planning Services: CM Consult   Living arrangements for the past 2 months: Single Family Home                                       Social Determinants of Health (SDOH) Interventions SDOH Screenings   Food Insecurity: No Food Insecurity (04/23/2022)  Housing: Low Risk  (05/13/2022)  Transportation Needs: No Transportation Needs (05/13/2022)  Utilities: Not At Risk (05/13/2022)  Tobacco Use: Low Risk  (05/12/2022)    Readmission Risk Interventions   No data to display

## 2022-05-17 NOTE — Evaluation (Signed)
Physical Therapy Evaluation Patient Details Name: Jermaine [name] MRN: [id number] DOB: 27-Feb-1966 Today's Date: 05/17/2022  History of Present Illness  Patient is a 56 year old male with history of chronic alcoholism, multiple admissions in the past for alcohol withdrawal, depression, suicidal ideation, most recently discharged on 11/30 /23 after being cleared from psychiatry presents with alcohol withdrawal, tremor, hallucination.  Clinical Impression   Pt very cooperative and stated he is doing better and feeling better. Pt still has some tremors and ataxic movement with LEs causing gait to appear unsteady at times and reaching for these to steady himself. Tried RW however unable to walk with RW without running into things and steer it. Then 2 person bilateral hand held assist was better. Will continue to work with pt, unsure of his DC and if he will have help at home upon DC because he did state he lives alone but could have someone to help him,     Recommendations for follow up therapy are one component of a multi-disciplinary discharge planning process, led by the attending physician.  Recommendations may be updated based on patient status, additional functional criteria and insurance authorization.  Follow Up Recommendations Home health PT      Assistance Recommended at Discharge Frequent or constant Supervision/Assistance  Patient can return home with the following  A little help with walking and/or transfers;A little help with bathing/dressing/bathroom;Assist for transportation    Equipment Recommendations None recommended by PT (at this time, pt not doign well with RW)  Recommendations for Other Services       Functional Status Assessment Patient has had a recent decline in their functional status and demonstrates the ability to make significant improvements in function in a reasonable and predictable amount of time.     Precautions / Restrictions        Mobility  Bed  Mobility Overal bed mobility: Modified Independent       Supine to sit: Modified independent (Device/Increase time) Sit to supine: Modified independent (Device/Increase time)        Transfers Overall transfer level: Needs assistance Equipment used: None Transfers: Sit to/from Stand Sit to Stand: Min guard           General transfer comment: intially a little unsteady pt reaching out to grab something    Ambulation/Gait Ambulation/Gait assistance: Min assist Gait Distance (Feet): 150 Feet Assistive device: Rolling walker (2 wheels) (started withRW but unable to steer and keep it from running itno things. Gait improved when he did B hanad held assist) Gait Pattern/deviations: Step-through pattern, Ataxic       General Gait Details: with rW pt could not steer it and had to be redirectly with assistance and with cues. Howeer with B hand held assist pt was better with step through pattern and decreased ataxic appearance.  Stairs            Wheelchair Mobility    Modified Rankin (Stroke Patients Only)       Balance                                             Pertinent Vitals/Pain      Home Living Family/patient expects to be discharged to:: Private residence Living Arrangements: Alone Available Help at Discharge: Friend(s);Available PRN/intermittently Type of Home: Apartment Home Access: Level entry       Home Layout: One level Home Equipment: None  Additional Comments: ground floor apartment    Prior Function Prior Level of Function : Independent/Modified Independent               ADLs Comments: used to work in Lobby[employer]        Extremity/Trunk Assessment        Lower Extremity Assessment Lower Extremity Assessment: Generalized weakness (tremors present and ataxia with movment patterns BLEs)       Communication   Communication: No difficulties  Cognition Arousal/Alertness: Awake/alert Behavior  During Therapy: WFL for tasks assessed/performed Overall Cognitive Status: Impaired/Different from baseline Area of Impairment: Awareness, Safety/judgement, Problem solving                           Awareness: Emergent Problem Solving: Slow processing          General Comments      Exercises     Assessment/Plan    PT Assessment Patient needs continued PT services  PT Problem List Decreased strength;Decreased activity tolerance;Decreased balance;Decreased mobility       PT Treatment Interventions DME instruction;Gait training;Functional mobility training;Therapeutic activities;Therapeutic exercise;Patient/family education    PT Goals (Current goals can be found in the Care Plan section)  Acute Rehab PT Goals Patient Stated Goal: I want to feel better PT Goal Formulation: With patient Time For Goal Achievement: 05/31/22 Potential to Achieve Goals: Fair    Frequency Min 3X/week     Co-evaluation               AM-PAC PT "6 Clicks" Mobility  Outcome Measure Help needed turning from your back to your side while in a flat bed without using bedrails?: None Help needed moving from lying on your back to sitting on the side of a flat bed without using bedrails?: None Help needed moving to and from a bed to a chair (including a wheelchair)?: None Help needed standing up from a chair using your arms (e.g., wheelchair or bedside chair)?: A Little Help needed to walk in hospital room?: A Little Help needed climbing 3-5 steps with a railing? : A Little 6 Click Score: 21    End of Session Equipment Utilized During Treatment: Gait belt Activity Tolerance: Patient tolerated treatment well Patient left: in bed;with nursing/sitter in room;with bed alarm set Nurse Communication: Mobility status PT Visit Diagnosis: Unsteadiness on feet (R26.81);Muscle weakness (generalized) (M62.81)    Time: 0981-1914 PT Time Calculation (min) (ACUTE ONLY): 19 min   Charges:    PT Evaluation $PT Eval Low Complexity: 1 Low          , PT, MPT Acute Rehabilitation Services Office: [phone] If a weekend: [facility] w/e pager [phone] 05/17/2022   Marella Bile 05/17/2022, 5:12 PM

## 2022-05-18 DIAGNOSIS — F10931 Alcohol use, unspecified with withdrawal delirium: Secondary | ICD-10-CM | POA: Diagnosis not present

## 2022-05-18 MED ORDER — HYDROXYZINE HCL 25 MG PO TABS
25.0000 mg | ORAL_TABLET | Freq: Three times a day (TID) | ORAL | Status: DC | PRN
  Administered 2022-05-18 – 2022-05-22 (×8): 25 mg via ORAL
  Filled 2022-05-18 (×8): qty 1

## 2022-05-18 MED ORDER — PHENOBARBITAL SODIUM 65 MG/ML IJ SOLN: 32.5000 mg | Freq: Three times a day (TID) | INTRAMUSCULAR | Status: DC

## 2022-05-18 MED ORDER — LOPERAMIDE HCL 2 MG PO CAPS: 2.0000 mg | ORAL_CAPSULE | ORAL | Status: DC | PRN

## 2022-05-18 MED ORDER — ACETAMINOPHEN 325 MG PO TABS
650.0000 mg | ORAL_TABLET | Freq: Three times a day (TID) | ORAL | Status: DC | PRN
  Administered 2022-05-18 – 2022-05-21 (×3): 650 mg via ORAL
  Filled 2022-05-18 (×3): qty 2

## 2022-05-18 MED ORDER — PHENOBARBITAL SODIUM 130 MG/ML IJ SOLN
32.5000 mg | Freq: Three times a day (TID) | INTRAMUSCULAR | Status: AC
  Administered 2022-05-18 – 2022-05-20 (×6): 32.5 mg via INTRAVENOUS
  Filled 2022-05-18 (×6): qty 1

## 2022-05-18 MED ORDER — PHENOBARBITAL SODIUM 130 MG/ML IJ SOLN: 32.5000 mg | Freq: Three times a day (TID) | INTRAMUSCULAR | Status: DC

## 2022-05-18 NOTE — Progress Notes (Signed)
Patient reported feeling "through the roof" anxiety.  Sitter notified RN that sitter felt concerned about patient's increased agitation, impulsive attempts to ambulate in hallway.  PRN anti-anxiety medication administered.  Per patient's request, spiritual consult ordered.  Patient continues to express concerns over ability to stay sober upon discharge.  Bradd Burner, RN

## 2022-05-18 NOTE — Consult Note (Signed)
[facility] Face-to-Face Psychiatry Consult   Reason for Consult: Alcohol abuse and suicidal ideations Referring Physician:  Candelaria Stagers Patient Identification: Jermaine [name] MRN:  [id number] Principal Diagnosis: Alcohol withdrawal delirium (HCC) Diagnosis:  Principal Problem:   Alcohol withdrawal delirium (HCC) Active Problems:   Chronic alcohol abuse   Elevated liver enzymes   Thrombocytopenia (HCC)   Hepatic steatosis   Anxiety and depression   Total Time spent with patient: 15 minutes  Subjective:    Jermaine [name] is a 56 y.o. male seen and evaluated face-to-face by this provider.  He presents with a bright and pleasant affect.  Denying suicidal or homicidal ideations.  Does report making suicidal statements while intoxicated.  Currently denying plan or intent.  Does report auditory and visual hallucinations that are chronic in nature.  Discussed following up with residential treatment facility however patient stated " I have been to  [facility] three times already I do not think it is going to help?"  Reports multiple stressors related to his depression.  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 "  He does have a drinking problem."    Josiel denies any is currently followed by therapy or psychiatry on outpatient basis.  Chart review patient is currently prescribed Lexapro 20 mg and phenobarbital taper. He denied paranoia or psychosis.  Denied alcohol craving or any withdrawal symptoms during this assessment.  Patient to continue safety sitter who is at bedside.  Per chart patient is currently under involuntary commitment due to passive suicidal ideations.  Psychiatry to continue to follow.  Support, encouragement and  reassurance was provided.  HPI: Per initial admission assessment note: "Jermaine [name] is a 56 y.o. male.  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 Patient states that he last  had a drink this morning.  States that he then had 1 white claw at around 1 PM.  He states that he has been drinking 24 white claws a day for extended period of time.  He denies other alcohol use such as beer or wine or spirits.  He denies drug use.  He currently feels like he has an withdrawal.  He states that he has been admitted many times for alcohol withdrawal before.  He is unsure if he has previously had a seizure.  He is endorsing headache, nausea, anxiety and tremor.  He denies having hallucinations.  He denies SI/HI/AVH"  Past Psychiatric History:   Risk to Self:   Risk to Others:   Prior Inpatient Therapy:   Prior Outpatient Therapy:    Past Medical History:  Past Medical History:  Diagnosis Date   Alcohol abuse    Anxiety    Class 1 obesity 09/14/2021   Depression     Past Surgical History:  Procedure Laterality Date   HERNIA REPAIR     Family History:  Family History  Adopted: Yes   Family Psychiatric  History:  Social History:  Social History   Substance and Sexual Activity  Alcohol Use Yes   Alcohol/week: 20.0 standard drinks of alcohol   Types: 20 Standard drinks or equivalent per week     Social History   Substance and Sexual Activity  Drug Use Not Currently    Social History   Socioeconomic History   Marital status: Single    Spouse name: Not on file   Number of children: Not on file   Years of education: Not on file  Highest education level: Not on file  Occupational History   Not on file  Tobacco Use   Smoking status: Never   Smokeless tobacco: Never  Vaping Use   Vaping Use: Never used  Substance and Sexual Activity   Alcohol use: Yes    Alcohol/week: 20.0 standard drinks of alcohol    Types: 20 Standard drinks or equivalent per week   Drug use: Not Currently   Sexual activity: Not on file  Other Topics Concern   Not on file  Social History Narrative   Not on file   Social Determinants of Health   Financial Resource Strain: Not  on file  Food Insecurity: No Food Insecurity (04/23/2022)   Hunger Vital Sign    Worried About Running Out of Food in the Last Year: Never true    Ran Out of Food in the Last Year: Never true  Transportation Needs: No Transportation Needs (05/13/2022)   PRAPARE - Administrator, Civil ServiceTransportation    Lack of Transportation (Medical): No    Lack of Transportation (Non-Medical): No  Physical Activity: Not on file  Stress: Not on file  Social Connections: Not on file   Additional Social History:    Allergies:  No Known Allergies  Labs:  Results for orders placed or performed during the hospital encounter of 05/12/22 (from the past 48 hour(s))  Phosphorus     Status: None   Collection Time: 05/17/22  3:52 AM  Result Value Ref Range   Phosphorus 3.7 2.5 - 4.6 mg/dL    Comment: Performed at [facility], [address] [place], [place] [address]  Magnesium     Status: None   Collection Time: 05/17/22  3:52 AM  Result Value Ref Range   Magnesium 2.2 1.7 - 2.4 mg/dL    Comment: Performed at [facility], [address] [place], [place] [address]  Comprehensive metabolic panel     Status: Abnormal   Collection Time: 05/17/22  3:52 AM  Result Value Ref Range   Sodium 137 135 - 145 mmol/L   Potassium 4.2 3.5 - 5.1 mmol/L   Chloride 102 98 - 111 mmol/L   CO2 27 22 - 32 mmol/L   Glucose, Bld 96 70 - 99 mg/dL    Comment: Glucose reference range applies only to samples taken after fasting for at least 8 hours.   BUN 17 6 - 20 mg/dL   Creatinine, Ser 0.980.81 0.61 - 1.24 mg/dL   Calcium 8.9 8.9 - 11.910.3 mg/dL   Total Protein 6.2 (L) 6.5 - 8.1 g/dL   Albumin 3.5 3.5 - 5.0 g/dL   AST 147164 (H) 15 - 41 U/L   ALT 257 (H) 0 - 44 U/L   Alkaline Phosphatase 51 38 - 126 U/L   Total Bilirubin 0.7 0.3 - 1.2 mg/dL   GFR, Estimated >82>60 >95>60 mL/min    Comment: (NOTE) Calculated using the CKD-EPI Creatinine Equation (2021)    Anion gap 8 5 - 15    Comment: Performed at [facility], [address] [place], [place] [address]  CK     Status: None   Collection Time: 05/17/22  3:52 AM  Result Value Ref Range   Total CK 62 49 - 397 U/L    Comment: Performed at [facility], [address] [place], [place] [address]  CBC     Status: Abnormal   Collection Time: 05/17/22  3:52 AM  Result Value Ref Range  WBC 5.1 4.0 - 10.5 K/uL   RBC 4.88 4.22 - 5.81 MIL/uL   Hemoglobin 15.0 13.0 - 17.0 g/dL   HCT 36.6 44.0 - 34.7 %   MCV 94.7 80.0 - 100.0 fL   MCH 30.7 26.0 - 34.0 pg   MCHC 32.5 30.0 - 36.0 g/dL   RDW 42.5 (H) 95.6 - 38.7 %   Platelets 88 (L) 150 - 400 K/uL    Comment: Immature Platelet Fraction may be clinically indicated, consider ordering this additional test [id number]    nRBC 0.0 0.0 - 0.2 %    Comment: Performed at [facility], [address] [place], [place] [address]    Current Facility-Administered Medications  Medication Dose Route Frequency Provider Last Rate Last Admin   0.9 %  sodium chloride infusion   Intravenous Continuous Burnadette Pop, MD 75 mL/hr at 05/18/22 0244 Infusion Verify at 05/18/22 0244   acetaminophen (TYLENOL) tablet 650 mg  650 mg Oral Q6H PRN Burnadette Pop, MD   650 mg at 05/17/22 0043   escitalopram (LEXAPRO) tablet 20 mg  20 mg Oral Daily Burnadette Pop, MD   20 mg at 05/18/22 1032   folic acid (FOLVITE) tablet 1 mg  1 mg Oral Daily Norins, Rosalyn Gess, MD   1 mg at 05/18/22 1032   loperamide (IMODIUM) capsule 2 mg  2 mg Oral PRN Almon Hercules, MD       LORazepam (ATIVAN) tablet 1-4 mg  1-4 mg Oral Q1H PRN Candelaria Stagers T, MD   2 mg at 05/18/22 1032   Or   LORazepam (ATIVAN) injection 1-4 mg  1-4 mg Intravenous Q1H PRN Candelaria Stagers T, MD   3 mg at 05/17/22 0949   multivitamin with minerals tablet 1 tablet  1 tablet Oral Daily Norins, Rosalyn Gess, MD   1 tablet at 05/18/22 1032   Oral care mouth rinse  15 mL Mouth Rinse PRN Norins, Rosalyn Gess, MD       PHENObarbital  (LUMINAL) injection 65 mg  65 mg Intravenous Q8H Adhikari, Amrit, MD   65 mg at 05/18/22 0400   Followed by   PHENObarbital (LUMINAL) injection 32.5 mg  32.5 mg Intravenous Q8H Adhikari, Amrit, MD       thiamine (VITAMIN B1) tablet 100 mg  100 mg Oral Daily Roemhildt, Lorin T, PA-C   100 mg at 05/18/22 1032   Or   thiamine (VITAMIN B1) injection 100 mg  100 mg Intravenous Daily Roemhildt, Lorin T, PA-C        Musculoskeletal: Strength & Muscle Tone: within normal limits Gait & Station: normal Patient leans: N/A   Psychiatric Specialty Exam:  Presentation  General Appearance:  Appropriate for Environment; Casual  Eye Contact: Good  Speech: Clear and Coherent; Normal Rate  Speech Volume: Normal  Handedness: Right   Mood and Affect  Mood: Euthymic  Affect: Appropriate; Congruent   Thought Process  Thought Processes: Coherent; Goal Directed  Descriptions of Associations:Intact  Orientation:Full (Time, Place and Person)  Thought Content:-- (Overly negative/hopeless)  History of Schizophrenia/Schizoaffective disorder:No data recorded Duration of Psychotic Symptoms:No data recorded Hallucinations:No data recorded Ideas of Reference:None  Suicidal Thoughts:No data recorded Homicidal Thoughts:No data recorded  Sensorium  Memory: Immediate Good; Recent Good; Remote Good  Judgment: Poor  Insight: Poor   Executive Functions  Concentration: Fair  Attention Span: Good  Recall: Good  Fund of Knowledge: Good  Language: Good   Psychomotor Activity  Psychomotor Activity:No data recorded  Assets  Assets: Communication Skills   Sleep  Sleep:No data recorded  Physical Exam: Physical Exam Vitals reviewed.  Cardiovascular:     Rate and Rhythm: Normal rate and regular rhythm.  Skin:    General: Skin is warm.  Neurological:     Mental Status: He is alert and oriented to person, place, and time.  Psychiatric:        Mood and Affect:  Mood normal.        Behavior: Behavior normal.    Review of Systems  Gastrointestinal: Negative.   Psychiatric/Behavioral:  Positive for depression, hallucinations and substance abuse. Suicidal ideas: passive ideaitons.The patient is nervous/anxious.   All other systems reviewed and are negative.  Blood pressure (!) 122/94, pulse 86, temperature 98 F (36.7 C), temperature source Oral, resp. rate 18, height 5\' 10"  (1.778 m), weight 88.1 kg, SpO2 96 %. Body mass index is 27.87 kg/m.  Treatment Plan Summary: Daily contact with patient to assess and evaluate symptoms and progress in treatment and Medication management  Continue Lexapro 20 mg p.o. daily Continue Ativan CIWA detox protocol Continue phenobarbital taper  Psychiatry to continue to follow  Disposition: Supportive therapy provided about ongoing stressors. Refer to IOP.  Derrill Center, NP 05/18/2022 10:44 AM

## 2022-05-18 NOTE — Progress Notes (Signed)
Mobility Specialist - Progress Note   05/18/22 1300  Mobility  Activity Ambulated with assistance in hallway  Level of Assistance Contact guard assist, steadying assist  Assistive Device Other (Comment) (HHA)  Distance Ambulated (ft) 350 ft  Range of Motion/Exercises Active  Activity Response Tolerated well  Mobility Referral Yes  $Mobility charge 1 Mobility   Sitter requested help to ambulate pt in hallway. Pt stating feeling anxious to RN during ambulation. Pt stated wanting to walk faster to see how fast he could walk but was advised not to. At EOS was left sitting EOB with sitter and RN in room.  Billey Chang Mobility Specialist

## 2022-05-18 NOTE — Progress Notes (Signed)
Mobility Specialist - Progress Note   05/18/22 1125  Mobility  Activity Ambulated with assistance in hallway  Level of Assistance Contact guard assist, steadying assist  Assistive Device Other (Comment) (HHA)  Distance Ambulated (ft) 150 ft  Range of Motion/Exercises Active  Activity Response Tolerated well  Mobility Referral Yes  $Mobility charge 1 Mobility   Pt was found in bed and agreeable to ambulate. Pt was able to ambulate w/ bilateral HHA w/ sitter's help. During ambulation stated feeling wobbly, having blurred vision, and not feeling too good. At EOS returned to bed with necessities and sitter in room.    Billey Chang Mobility Specialist

## 2022-05-18 NOTE — Progress Notes (Signed)
PROGRESS NOTE  Jermaine [name] DXA:[id number] DOB: January 10, 1966   PCP: Farris Has, MD  Patient is from: Home.  DOA: 05/12/2022 LOS: 6  Chief complaints Chief Complaint  Patient presents with   Drug / Alcohol Assessment     Brief Narrative / Interim history: 56 year old male with history of chronic alcoholism, multiple admissions in the past for alcohol withdrawal, depression, suicidal ideation, most recently discharged on 11/30 /23 after being cleared from psychiatry presents with alcohol withdrawal, tremor, hallucination. On presentation, he was hypertensive, tachycardic, tremulous. Alcohol level of 257. Started on CIWA protocol with as needed Ativan.  PCCM consulted and started phenobarbital taper.  There was concern about suicidal ideation and psychiatry was consulted.  Psychiatry recommended IVC and inpatient psych hospitalization once medically cleared.  CIWA remains elevated although he has been 6 days out now.   Subjective: Seen and examined earlier this morning.  No major events overnight of this morning.  Complains of abdominal cramping and some loose stool although chronic.  Also reports shaking.  CIWA has been elevated to 17.   Objective: Vitals:   05/17/22 2255 05/18/22 0749 05/18/22 0926 05/18/22 1126  BP: 118/79 105/76 (!) 122/94 (!) 124/100  Pulse: 75 95 86 94  Resp: 18     Temp: 98 F (36.7 C)     TempSrc: Oral     SpO2: 99% 96%    Weight:      Height:        Examination:  GENERAL: No apparent distress.  Nontoxic. HEENT: MMM.  Vision and hearing grossly intact.  NECK: Supple.  No apparent JVD.  RESP:  No IWOB.  Fair aeration bilaterally. CVS:  RRR. Heart sounds normal.  ABD/GI/GU: BS+. Abd soft, NTND.  MSK/EXT:  Moves extremities. No apparent deformity. No edema.  SKIN: no apparent skin lesion or wound NEURO: Awake and alert. Oriented appropriately.  Subtle coarse tremor in upper extremities.  No apparent focal neuro deficit. PSYCH: Calm. Normal affect.    Procedures:  None  Microbiology summarized: None  Assessment and plan: Principal Problem:   Alcohol withdrawal delirium (HCC) Active Problems:   Chronic alcohol abuse   Elevated liver enzymes   Thrombocytopenia (HCC)   Hepatic steatosis   Anxiety and depression  Acute alcohol withdrawal with complication: Reportedly sober for 5 months prior to last month of binge drinking.  He was also attending AA 5 times a week, [facility].  Reports binge drinking.  Presents with tremors and audiovisual hallucination.   UDS positive for benzos and barbiturates.  CIWA remains elevated although he is 6 days out now. -Appreciate help by PCCM-started phenobarbital taper. -Continue CIWA with as needed Ativan. -Continue multivitamin, thiamine and folic acid   Elevated liver enzymes/hepatic steatosis: Chronic elevation of liver enzymes but worsened on this admission.  ALP and total bili within normal.  Improving. -Continue monitoring   Anxiety/depression/suicidal ideation: "Patient denied this but the sister called said  his friend found a noose hung in patient's house with a chair under it like patient is prepared to commit suicide".  Psych reconsulted and recommended IVC and inpatient psychiatric hospitalization once medically clear. -IVC paperwork signed by psychiatry and faxed to magistrate on 12/21 -Continue one-to-one safety sitter -Continue Lexapro.   Patient history of diverticulitis/chronic diarrhea: Was admitted and managed for this on last admission.abdominal exam benign. -Outpatient follow-up with GI for colonoscopy to assess burden of diverticulosis -Start Imodium as needed.  Generalized weakness:  -PT/OT  Thrombocytopenia: Likely from alcohol.  Improving. Recent Labs  Lab 05/12/22 1810 05/16/22 0418 05/17/22 0352  PLT 150 78* 88*  -SCD for VTE prophylaxis  Hypokalemia and hyponatremia: Likely from alcohol.  Resolved.  Increased nutrient needs Body mass index is 27.87  kg/m. Nutrition Problem: Increased nutrient needs Etiology: chronic illness (alcoholism) Signs/Symptoms: other (comment) (need for folic acid, thiamine, and multivitamin supplementation) Interventions: Refer to RD note for recommendations, MVI   DVT prophylaxis:  Place and maintain sequential compression device Start: 05/16/22 0745  Code Status: Full code Family Communication: None at bedside Level of care: Med-Surg Status is: Inpatient Remains inpatient appropriate because: Alcohol withdrawal, suicidal ideation   Final disposition: Inpatient psychiatric hospital Consultants:  Psychiatry Pulmonology  Sch Meds:  Scheduled Meds:  escitalopram  20 mg Oral Daily   folic acid  1 mg Oral Daily   multivitamin with minerals  1 tablet Oral Daily   PHENObarbital  32.5 mg Intravenous Q8H   thiamine  100 mg Oral Daily   Or   thiamine  100 mg Intravenous Daily   Continuous Infusions:  sodium chloride 75 mL/hr at 05/18/22 0244   PRN Meds:.acetaminophen, loperamide, LORazepam **OR** LORazepam, mouth rinse  Antimicrobials: Anti-infectives (From admission, onward)    None        I have personally reviewed the following labs and images: CBC: Recent Labs  Lab 05/12/22 1810 05/16/22 0418 05/17/22 0352  WBC 9.9 5.1 5.1  HGB 17.7* 14.3 15.0  HCT 52.2* 44.0 46.2  MCV 89.7 94.4 94.7  PLT 150 78* 88*   BMP &GFR Recent Labs  Lab 05/12/22 2013 05/13/22 1037 05/14/22 0447 05/14/22 1057 05/15/22 0446 05/16/22 0418 05/17/22 0352  NA  --    < > 122* 137 138 137 137  K  --    < > 2.5* 3.9 3.3* 4.1 4.2  CL  --    < > 92* 101 108 101 102  CO2  --    < > 22 26 23 26 27   GLUCOSE  --    < > 73 129* 87 99 96  BUN  --    < > 10 17 10 15 17   CREATININE  --    < > 0.40* 0.79 0.58* 0.70 0.81  CALCIUM  --    < > 6.3* 9.4 7.6* 9.0 8.9  MG 2.1  --   --   --   --   --  2.2  PHOS  --   --   --   --   --   --  3.7   < > = values in this interval not displayed.   Estimated Creatinine  Clearance: 113.8 mL/min (by C-G formula based on SCr of 0.81 mg/dL). Liver & Pancreas: Recent Labs  Lab 05/13/22 1037 05/14/22 0447 05/15/22 0446 05/16/22 0418 05/17/22 0352  AST 433* 298* 283* 244* 164*  ALT 314* 232* 267* 300* 257*  ALKPHOS 47 31* 42 53 51  BILITOT 1.7* 1.0 0.6 0.6 0.7  PROT 7.2 4.6* 5.4* 6.5 6.2*  ALBUMIN 4.5 2.8* 3.2* 3.4* 3.5   No results for input(s): "LIPASE", "AMYLASE" in the last 168 hours. No results for input(s): "AMMONIA" in the last 168 hours. Diabetic: No results for input(s): "HGBA1C" in the last 72 hours. No results for input(s): "GLUCAP" in the last 168 hours. Cardiac Enzymes: Recent Labs  Lab 05/17/22 0352  CKTOTAL 62   No results for input(s): "PROBNP" in the last 8760 hours. Coagulation Profile: Recent Labs  Lab 05/12/22 1855  INR 1.1   Thyroid  Function Tests: No results for input(s): "TSH", "T4TOTAL", "FREET4", "T3FREE", "THYROIDAB" in the last 72 hours. Lipid Profile: No results for input(s): "CHOL", "HDL", "LDLCALC", "TRIG", "CHOLHDL", "LDLDIRECT" in the last 72 hours. Anemia Panel: No results for input(s): "VITAMINB12", "FOLATE", "FERRITIN", "TIBC", "IRON", "RETICCTPCT" in the last 72 hours. Urine analysis:    Component Value Date/Time   COLORURINE YELLOW 04/23/2022 0237   APPEARANCEUR CLEAR 04/23/2022 0237   LABSPEC >1.046 (H) 04/23/2022 0237   PHURINE 5.0 04/23/2022 0237   GLUCOSEU NEGATIVE 04/23/2022 0237   HGBUR NEGATIVE 04/23/2022 0237   BILIRUBINUR NEGATIVE 04/23/2022 0237   KETONESUR 20 (A) 04/23/2022 0237   PROTEINUR NEGATIVE 04/23/2022 0237   NITRITE NEGATIVE 04/23/2022 0237   LEUKOCYTESUR NEGATIVE 04/23/2022 0237   Sepsis Labs: Invalid input(s): "PROCALCITONIN", "LACTICIDVEN"  Microbiology: No results found for this or any previous visit (from the past 240 hour(s)).  Radiology Studies: No results found.     T.  [facility]  If 7PM-7AM, please contact  night-coverage [email or web address] 05/18/2022, 12:04 PM

## 2022-05-18 NOTE — Progress Notes (Signed)
Chaplain responded to spiritual consult.  Patient was sound asleep when chaplain came.  Patient woke after awhile and when asked if he wanted to talk, he responded "another day. I'm still half-asleep." Chaplains are available when patient is ready to talk. Rev. Lynnell Chad Pager [phone]

## 2022-05-19 DIAGNOSIS — F419 Anxiety disorder, unspecified: Secondary | ICD-10-CM | POA: Diagnosis not present

## 2022-05-19 DIAGNOSIS — K760 Fatty (change of) liver, not elsewhere classified: Secondary | ICD-10-CM | POA: Diagnosis not present

## 2022-05-19 DIAGNOSIS — R45851 Suicidal ideations: Secondary | ICD-10-CM | POA: Diagnosis not present

## 2022-05-19 DIAGNOSIS — F10931 Alcohol use, unspecified with withdrawal delirium: Secondary | ICD-10-CM | POA: Diagnosis not present

## 2022-05-19 DIAGNOSIS — F1010 Alcohol abuse, uncomplicated: Secondary | ICD-10-CM | POA: Diagnosis not present

## 2022-05-19 LAB — COMPREHENSIVE METABOLIC PANEL
ALT: 193 U/L — ABNORMAL HIGH (ref 0–44)
AST: 85 U/L — ABNORMAL HIGH (ref 15–41)
Albumin: 4 g/dL (ref 3.5–5.0)
Alkaline Phosphatase: 76 U/L (ref 38–126)
Anion gap: 7 (ref 5–15)
BUN: 26 mg/dL — ABNORMAL HIGH (ref 6–20)
CO2: 30 mmol/L (ref 22–32)
Calcium: 9.6 mg/dL (ref 8.9–10.3)
Chloride: 100 mmol/L (ref 98–111)
Creatinine, Ser: 0.94 mg/dL (ref 0.61–1.24)
GFR, Estimated: >60 mL/min (ref >60)
Glucose, Bld: 83 mg/dL (ref 70–99)
Potassium: 4.3 mmol/L (ref 3.5–5.1)
Sodium: 137 mmol/L (ref 135–145)
Total Bilirubin: 0.5 mg/dL (ref 0.3–1.2)
Total Protein: 6.9 g/dL (ref 6.5–8.1)

## 2022-05-19 LAB — C-REACTIVE PROTEIN: CRP: 0.7 mg/dL (ref <1.0)

## 2022-05-19 LAB — MAGNESIUM: Magnesium: 2.5 mg/dL — ABNORMAL HIGH (ref 1.7–2.4)

## 2022-05-19 LAB — CBC
HCT: 44.2 % (ref 39.0–52.0)
Hemoglobin: 14.5 g/dL (ref 13.0–17.0)
MCH: 31 pg (ref 26.0–34.0)
MCHC: 32.8 g/dL (ref 30.0–36.0)
MCV: 94.6 fL (ref 80.0–100.0)
Platelets: 138 10*3/uL — ABNORMAL LOW (ref 150–400)
RBC: 4.67 MIL/uL (ref 4.22–5.81)
RDW: 16.1 % — ABNORMAL HIGH (ref 11.5–15.5)
WBC: 6.3 10*3/uL (ref 4.0–10.5)
nRBC: 0 % (ref 0.0–0.2)

## 2022-05-19 LAB — PHOSPHORUS: Phosphorus: 3.5 mg/dL (ref 2.5–4.6)

## 2022-05-19 LAB — SEDIMENTATION RATE: Sed Rate: 10 mm/hr (ref 0–16)

## 2022-05-19 MED ORDER — LORAZEPAM 0.5 MG PO TABS
0.5000 mg | ORAL_TABLET | Freq: Two times a day (BID) | ORAL | Status: DC
  Administered 2022-05-19 – 2022-05-21 (×4): 0.5 mg via ORAL
  Filled 2022-05-19 (×5): qty 1

## 2022-05-19 NOTE — Progress Notes (Signed)
Patient visited by friend Jermaine [name], who reports being patient's "best friend" (confirmed by patient).  Jermaine [name] reported to RN that patient's house was "disgusting," and that patient had a noose attached to closet door.  Per visitor, patient said that he thought he might "try it" (referring to noose) "like Jermaine [name] did."  Visitor also reports having removed other nooses from patient's home in the past.  Visitor expressed concern that patient might hurt himself when intoxicated, and that patient would most likely return to drinking once he gets home.  MD and psychiatry made aware.  Bradd Burner, RN

## 2022-05-19 NOTE — Consult Note (Signed)
[facility] Face-to-Face Psychiatry Consult   Reason for Consult:  Alcohol abuse and suicidal ideations  Referring Physician:  Candelaria Stagers Patient Identification: Jermaine [name] MRN:  [id number] Principal Diagnosis: Alcohol withdrawal delirium (HCC) Diagnosis:  Principal Problem:   Alcohol withdrawal delirium (HCC) Active Problems:   Chronic alcohol abuse   Elevated liver enzymes   Thrombocytopenia (HCC)   Hepatic steatosis   Anxiety and depression   Total Time spent with patient: 20 minutes    Subjective:   Jermaine [name] is a 56 y.o. male patient admitted with alcohol withdrawal. Psychiatry consulted for alcohol abuse and suicidal ideations.  Patient seen for follow-up today.  Patient presents with a bright and pleasant affect today. He denies suicidal or homicidal ideations. He again report making suicidal statements while intoxicated.  Currently denying plan or intent, although when asked about noose and a chair (found by friend), he says "just in case if I need to use it", then adds "just kidding". . He does not want to go to residential treatment facility. He continues to reports multiple stressors related to his depression - lost job, substance use. Discussed following up with outpatient psychiatrist - he says he is agreeable. Does report auditory and visual hallucinations that are chronic in nature. Denied alcohol craving or any withdrawal symptoms during this assessment.  Patient refused to provide me any contacts for collateral information and says he does not want [place] to contact anyone.  Per RN note from last night: "patient visited by friend Jermaine [name], who reports being patient's "best friend" (confirmed by patient). [name] reported to RN that patient's house was "disgusting," and that patient had a noose attached to closet door. Per visitor, patient said that he thought he might "try it" (referring to noose) "like Jermaine [name] did." Visitor also reports having removed other nooses from  patient's home in the past. Visitor expressed concern that patient might hurt himself when intoxicated, and that patient would most likely return to drinking once he gets home".  Past Medical History:  Past Medical History:  Diagnosis Date   Alcohol abuse    Anxiety    Class 1 obesity 09/14/2021   Depression     Past Surgical History:  Procedure Laterality Date   HERNIA REPAIR     Family History:  Family History  Adopted: Yes   Family Psychiatric  History:  Social History:  Social History   Substance and Sexual Activity  Alcohol Use Yes   Alcohol/week: 20.0 standard drinks of alcohol   Types: 20 Standard drinks or equivalent per week     Social History   Substance and Sexual Activity  Drug Use Not Currently    Social History   Socioeconomic History   Marital status: Single    Spouse name: Not on file   Number of children: Not on file   Years of education: Not on file   Highest education level: Not on file  Occupational History   Not on file  Tobacco Use   Smoking status: Never   Smokeless tobacco: Never  Vaping Use   Vaping Use: Never used  Substance and Sexual Activity   Alcohol use: Yes    Alcohol/week: 20.0 standard drinks of alcohol    Types: 20 Standard drinks or equivalent per week   Drug use: Not Currently   Sexual activity: Not on file  Other Topics Concern   Not on file  Social History Narrative   Not on file   Social Determinants of Health   Financial  Resource Strain: Not on file  Food Insecurity: No Food Insecurity (04/23/2022)   Hunger Vital Sign    Worried About Running Out of Food in the Last Year: Never true    Ran Out of Food in the Last Year: Never true  Transportation Needs: No Transportation Needs (05/13/2022)   PRAPARE - Administrator, Civil ServiceTransportation    Lack of Transportation (Medical): No    Lack of Transportation (Non-Medical): No  Physical Activity: Not on file  Stress: Not on file  Social Connections: Not on file   Additional Social  History:    Allergies:  No Known Allergies  Labs:  Results for orders placed or performed during the hospital encounter of 05/12/22 (from the past 48 hour(s))  Comprehensive metabolic panel     Status: Abnormal   Collection Time: 05/19/22  5:19 AM  Result Value Ref Range   Sodium 137 135 - 145 mmol/L   Potassium 4.3 3.5 - 5.1 mmol/L   Chloride 100 98 - 111 mmol/L   CO2 30 22 - 32 mmol/L   Glucose, Bld 83 70 - 99 mg/dL    Comment: Glucose reference range applies only to samples taken after fasting for at least 8 hours.   BUN 26 (H) 6 - 20 mg/dL   Creatinine, Ser 9.140.94 0.61 - 1.24 mg/dL   Calcium 9.6 8.9 - 78.210.3 mg/dL   Total Protein 6.9 6.5 - 8.1 g/dL   Albumin 4.0 3.5 - 5.0 g/dL   AST 85 (H) 15 - 41 U/L   ALT 193 (H) 0 - 44 U/L   Alkaline Phosphatase 76 38 - 126 U/L   Total Bilirubin 0.5 0.3 - 1.2 mg/dL   GFR, Estimated >95>60 >62>60 mL/min    Comment: (NOTE) Calculated using the CKD-EPI Creatinine Equation (2021)    Anion gap 7 5 - 15    Comment: Performed at [facility], [address] [place], [place] [address]  Phosphorus     Status: None   Collection Time: 05/19/22  5:19 AM  Result Value Ref Range   Phosphorus 3.5 2.5 - 4.6 mg/dL    Comment: Performed at [facility], [address] [place], [place] [address]  Magnesium     Status: Abnormal   Collection Time: 05/19/22  5:19 AM  Result Value Ref Range   Magnesium 2.5 (H) 1.7 - 2.4 mg/dL    Comment: Performed at [facility] [place] [facility], [address] [place], [place] [address]  CBC     Status: Abnormal   Collection Time: 05/19/22  5:19 AM  Result Value Ref Range   WBC 6.3 4.0 - 10.5 K/uL   RBC 4.67 4.22 - 5.81 MIL/uL   Hemoglobin 14.5 13.0 - 17.0 g/dL   HCT 28.444.2 13.239.0 - 44.052.0 %   MCV 94.6 80.0 - 100.0 fL   MCH 31.0 26.0 - 34.0 pg   MCHC 32.8 30.0 - 36.0 g/dL   RDW 10.216.1 (H) 72.511.5 - 36.615.5 %   Platelets 138 (L) 150 - 400 K/uL   nRBC 0.0 0.0 - 0.2 %    Comment: Performed at  [facility], [address] [place], [place] [address]  Sedimentation rate     Status: None   Collection Time: 05/19/22  5:19 AM  Result Value Ref Range   Sed Rate 10 0 - 16 mm/hr    Comment: Performed at [facility], [address] [place], [place] [address]  C-reactive protein  Status: None   Collection Time: 05/19/22  5:19 AM  Result Value Ref Range   CRP 0.7 <1.0 mg/dL    Comment: Performed at [facility], [address], [place], [place] [address]    Current Facility-Administered Medications  Medication Dose Route Frequency Provider Last Rate Last Admin   acetaminophen (TYLENOL) tablet 650 mg  650 mg Oral Q8H PRN Candelaria Stagers T, MD   650 mg at 05/18/22 1133   escitalopram (LEXAPRO) tablet 20 mg  20 mg Oral Daily Burnadette Pop, MD   20 mg at 05/19/22 0912   folic acid (FOLVITE) tablet 1 mg  1 mg Oral Daily Norins, Rosalyn Gess, MD   1 mg at 05/19/22 9983   hydrOXYzine (ATARAX) tablet 25 mg  25 mg Oral TID PRN Candelaria Stagers T, MD   25 mg at 05/19/22 1059   loperamide (IMODIUM) capsule 2 mg  2 mg Oral PRN Almon Hercules, MD       LORazepam (ATIVAN) tablet 1-4 mg  1-4 mg Oral Q1H PRN Candelaria Stagers T, MD   2 mg at 05/19/22 1059   Or   LORazepam (ATIVAN) injection 1-4 mg  1-4 mg Intravenous Q1H PRN Candelaria Stagers T, MD   3 mg at 05/18/22 1305   LORazepam (ATIVAN) tablet 0.5 mg  0.5 mg Oral BID [name], MD       multivitamin with minerals tablet 1 tablet  1 tablet Oral Daily Norins, Rosalyn Gess, MD   1 tablet at 05/19/22 0912   Oral care mouth rinse  15 mL Mouth Rinse PRN Norins, Rosalyn Gess, MD       PHENObarbital (LUMINAL) injection 32.5 mg  32.5 mg Intravenous Q8H Gonfa, Taye T, MD   32.5 mg at 05/19/22 1440   thiamine (VITAMIN B1) tablet 100 mg  100 mg Oral Daily Roemhildt, Lorin T, PA-C   100 mg at 05/19/22 3825   Or   thiamine (VITAMIN B1) injection 100 mg  100 mg Intravenous Daily Roemhildt, Lorin T, PA-C         Musculoskeletal: Strength & Muscle Tone: within normal limits Gait & Station: normal Patient leans: N/A      Psychiatric Specialty Exam:  Presentation  General Appearance:  Appropriate for Environment; Casual  Eye Contact: Good  Speech: Clear and Coherent; Normal Rate  Speech Volume: Normal  Handedness: Right   Mood and Affect  Mood: Euthymic  Affect: Appropriate; Congruent   Thought Process  Thought Processes: Coherent; Goal Directed  Descriptions of Associations:Intact  Orientation:Full (Time, Place and Person)  Thought Content:-- (Overly negative/hopeless)  History of Schizophrenia/Schizoaffective disorder:No data recorded Duration of Psychotic Symptoms:No data recorded Hallucinations: yes, chronic  Ideas of Reference:None  Suicidal Thoughts:No data recorded Homicidal Thoughts:No data recorded  Sensorium  Memory: Immediate Good; Recent Good; Remote Good  Judgment: Poor  Insight: Poor   Executive Functions  Concentration: Fair  Attention Span: Good  Recall: Good  Fund of Knowledge: Good  Language: Good   Psychomotor Activity  Psychomotor Activity:No data recorded  Assets  Assets: Communication Skills   Sleep  Sleep:No data recorded  Physical Exam: Physical Exam ROS Blood pressure 114/76, pulse 85, temperature 98.2 F (36.8 C), temperature source Oral, resp. rate 18, height 5\' 10"  (1.778 m), weight 88.1 kg, SpO2 96 %. Body mass index is 27.87 kg/m.  Assessment:  56 y.o. male patient admitted with alcohol withdrawal. Psychiatry consulted for alcohol abuse and suicidal ideations. Patient currently denies suicidal ideations, although his stressors  remains unchanged, he has no outpatient mental health care and is not interested in residential addiction treatment, not providing contacts for collateral information. Taking in consideration collateral info obtained by RN from patient`s friend  (see above), in my  opinion patient would still benefit from inpatient psych admission for safety and stabilization.  -1:1 sitter 24/7, do not discharge even [facility] -Hospital clothing only, no personal belongings in room -When "medically clear", primary team should contact TOC to facilitate transfer to inpatient psychiatric facility. -continue Lexapro 20mg  po daily for depression -continue CIWA with Ativan and phenobarbital taper for symptoms of acute alcohol withdrawal. -Will follow while here.  Treatment Plan Summary: Daily contact with patient to assess and evaluate symptoms and progress in treatment and Medication management  Disposition: Recommend psychiatric Inpatient admission when medically cleared.  , MD 05/19/2022 3:33 PM

## 2022-05-19 NOTE — Progress Notes (Signed)
[facility]                                                                               Jermaine [name], is a 56 y.o. male, DOB - June 05, 1965, [id number] Admit date - 05/12/2022    Outpatient Primary MD for the patient is London Pepper, MD  LOS - 7  days    Brief summary   56 year old male with history of chronic alcoholism, multiple admissions in the past for alcohol withdrawal, depression, suicidal ideation, most recently discharged on 11/30 /23 after being cleared from psychiatry presents with alcohol withdrawal, tremor, hallucination. On presentation, he was hypertensive, tachycardic, tremulous. Alcohol level of 257. Started on CIWA protocol with as needed Ativan. PCCM consulted and started phenobarbital taper. There was concern about suicidal ideation and psychiatry was consulted. Psychiatry recommended IVC and inpatient psych hospitalization once medically cleared.   Assessment & Plan    Assessment and Plan:  Acute alcohol withdrawal with complication: Reportedly sober for 5 months prior to last month of binge drinking.  He was also attending AA 5 times a week, [facility]. no hallucinations or tremors. He is requesting for ativan scheduled.    UDS positive for benzos and barbiturates.  CIWA remains elevated although he is 6 days out now. -Appreciate help by PCCM-started phenobarbital taper. D/c the taper after tomorrow's dose.  -Continue CIWA with as needed Ativan. Pt medically stable for discharge.    Elevated liver enzymes/hepatic steatosis: Chronic elevation of liver enzymes but worsened on this admission.  ALP and total bili within normal.  Improving.  Recommend checking liver enzymes in 4 weeks.    Anxiety/depression/suicidal ideation: "Patient denied this but the sister called said  his friend found a noose hung in patient's house with a chair under it like patient is prepared to commit suicide".  Psych reconsulted and recommended IVC and inpatient  psychiatric hospitalization once medically clear. -IVC paperwork signed by psychiatry and faxed to magistrate on 12/21 -Continue one-to-one safety sitter -Continue Lexapro.   Patient history of diverticulitis/chronic diarrhea: Was admitted and managed for this on last admission.abdominal exam benign. -Outpatient follow-up with GI for colonoscopy to assess burden of diverticulosis -Start Imodium as needed.   Generalized weakness:  -PT/OT   Thrombocytopenia: Likely from alcohol.  Improving.  Hypokalemia and hyponatremia: Likely from alcohol.  Resolved.   Increased nutrient needs Body mass index is 27.87 kg/m. Nutrition Problem: Increased nutrient needs Etiology: chronic illness (alcoholism) Signs/Symptoms: other (comment) (need for folic acid, thiamine, and multivitamin supplementation) Interventions: Refer to RD note for recommendations, MVI   RN Pressure Injury Documentation:    Malnutrition Type:  Nutrition Problem: Increased nutrient needs Etiology: chronic illness (alcoholism)   Malnutrition Characteristics:  Signs/Symptoms: other (comment) (need for folic acid, thiamine, and multivitamin supplementation)   Nutrition Interventions:  Interventions: Refer to RD note for recommendations, MVI  Estimated body mass index is 27.87 kg/m as calculated from the following:   Height as of this encounter: 5\' 10"  (1.778 m).   Weight as of this encounter: 88.1 kg.  Code Status: full code.  DVT Prophylaxis:  Place and maintain sequential compression device Start: 05/16/22 0745   Level of Care:  Level of care: Med-Surg Family Communication: NONE AT bedside.   Disposition Plan:     Remains inpatient appropriate:  possible discharge in am.   Procedures:  None.   Consultants:   Psychiatry.   Antimicrobials:   Anti-infectives (From admission, onward)    None        Medications  Scheduled Meds:  escitalopram  20 mg Oral Daily   folic acid  1 mg Oral Daily    LORazepam  0.5 mg Oral BID   multivitamin with minerals  1 tablet Oral Daily   PHENObarbital  32.5 mg Intravenous Q8H   thiamine  100 mg Oral Daily   Or   thiamine  100 mg Intravenous Daily   Continuous Infusions: PRN Meds:.acetaminophen, hydrOXYzine, loperamide, LORazepam **OR** LORazepam, mouth rinse    Subjective:   Jermaine [name] was seen and examined today.  ANXIOUS.   Objective:   Vitals:   05/19/22 0451 05/19/22 0848 05/19/22 1052 05/19/22 1147  BP: 96/72 103/71 103/69 114/76  Pulse: 95 96 79 85  Resp: 19 16  18   Temp: 98.1 F (36.7 C) 98 F (36.7 C)  98.2 F (36.8 C)  TempSrc: Oral Oral  Oral  SpO2: 98% 98%  96%  Weight:      Height:        Intake/Output Summary (Last 24 hours) at 05/19/2022 1630 Last data filed at 05/19/2022 1206 Gross per 24 hour  Intake 700 ml  Output --  Net 700 ml   Filed Weights   05/12/22 2340  Weight: 88.1 kg     Exam General: Alert and oriented x 3, NAD Cardiovascular: S1 S2 auscultated, no murmurs, RRR Respiratory: Clear to auscultation bilaterally, no wheezing, rales or rhonchi Gastrointestinal: Soft, nontender, nondistended, + bowel sounds Ext: no pedal edema bilaterally Neuro: AAOx3, Cr N's II- XII. Strength 5/5 upper and lower extremities bilaterally Skin: No rashes Psych: Normal affect and demeanor, alert and oriented x3    Data Reviewed:  I have personally reviewed following labs and imaging studies   CBC Lab Results  Component Value Date   WBC 6.3 05/19/2022   RBC 4.67 05/19/2022   HGB 14.5 05/19/2022   HCT 44.2 05/19/2022   MCV 94.6 05/19/2022   MCH 31.0 05/19/2022   PLT 138 (L) 05/19/2022   MCHC 32.8 05/19/2022   RDW 16.1 (H) 05/19/2022   LYMPHSABS 1.8 04/22/2022   MONOABS 0.5 04/22/2022   EOSABS 0.0 04/22/2022   BASOSABS 0.0 04/22/2022     Last metabolic panel Lab Results  Component Value Date   NA 137 05/19/2022   K 4.3 05/19/2022   CL 100 05/19/2022   CO2 30 05/19/2022   BUN 26 (H)  05/19/2022   CREATININE 0.94 05/19/2022   GLUCOSE 83 05/19/2022   GFRNONAA >60 05/19/2022   GFRAA >60 03/20/2019   CALCIUM 9.6 05/19/2022   PHOS 3.5 05/19/2022   PROT 6.9 05/19/2022   ALBUMIN 4.0 05/19/2022   BILITOT 0.5 05/19/2022   ALKPHOS 76 05/19/2022   AST 85 (H) 05/19/2022   ALT 193 (H) 05/19/2022   ANIONGAP 7 05/19/2022    CBG (last 3)  No results for input(s): "GLUCAP" in the last 72 hours.    Coagulation Profile: Recent Labs  Lab 05/12/22 1855  INR 1.1     Radiology Studies: No results found.     05/14/22 M.D. [facility] 05/19/2022, 4:30 PM  Available via Epic secure chat 7am-7pm After 7 pm, please refer to night coverage provider listed  on amion.

## 2022-05-20 DIAGNOSIS — F10931 Alcohol use, unspecified with withdrawal delirium: Secondary | ICD-10-CM | POA: Diagnosis not present

## 2022-05-20 DIAGNOSIS — F1010 Alcohol abuse, uncomplicated: Secondary | ICD-10-CM | POA: Diagnosis not present

## 2022-05-20 DIAGNOSIS — F419 Anxiety disorder, unspecified: Secondary | ICD-10-CM | POA: Diagnosis not present

## 2022-05-20 DIAGNOSIS — R748 Abnormal levels of other serum enzymes: Secondary | ICD-10-CM | POA: Diagnosis not present

## 2022-05-20 LAB — RESP PANEL BY RT-PCR (RSV, FLU A&B, COVID)  RVPGX2
Influenza A by PCR: NEGATIVE
Influenza B by PCR: NEGATIVE
Resp Syncytial Virus by PCR: NEGATIVE
SARS Coronavirus 2 by RT PCR: NEGATIVE

## 2022-05-20 MED ORDER — HYDROXYZINE HCL 25 MG PO TABS: 25.0000 mg | ORAL_TABLET | Freq: Three times a day (TID) | ORAL | 0 refills | Status: DC | PRN

## 2022-05-20 MED ORDER — ADULT MULTIVITAMIN W/MINERALS CH: 1.0000 | ORAL_TABLET | Freq: Every day | ORAL | 0 refills | Status: DC

## 2022-05-20 MED ORDER — VITAMIN B-1 100 MG PO TABS: 100.0000 mg | ORAL_TABLET | Freq: Every day | ORAL | 0 refills | Status: DC

## 2022-05-20 MED ORDER — PHENOBARBITAL SODIUM 65 MG/ML IJ SOLN: 22.0000 mg | Freq: Three times a day (TID) | INTRAMUSCULAR | Status: DC

## 2022-05-20 MED ORDER — FOLIC ACID 1 MG PO TABS: 1.0000 mg | ORAL_TABLET | Freq: Every day | ORAL | 0 refills | Status: DC

## 2022-05-20 NOTE — Progress Notes (Addendum)
1910 patient 1:1 sitter sir suicide 2100 on assessment of patient patient has personal cell phone that he's unwilling to give up apparently he's had his cell phone this entire time.  05/21/22 0630  patient informed that I had to take his phone this time he gave it to me stating Hunter the head Nurse said that the rules have changed and he was allowed to have it I informed him gain of the policy phone taken from patient all other information irrelevant. Phone labeled with patient sticker placed in two clear plastic bag and labeled again closing bags with patient rent receipts. Will pass on to on coming shift so they can get patient clothing and send with him when discharged

## 2022-05-20 NOTE — Progress Notes (Signed)
Attempted to call report x3 with busy tone, will try again later

## 2022-05-20 NOTE — Discharge Summary (Signed)
Physician Discharge Summary   Patient: Jermaine [name] MRN: [id number] DOB: 1966-01-03  Admit date:     05/12/2022  Discharge date: 05/20/22  Discharge Physician: Hosie Poisson   PCP: London Pepper, MD   Recommendations at discharge:  Please follow up with PCP in one week.  Recommend follow up with GI in 4 weeks.  Please cbc and BMP in one week.   Discharge Diagnoses: Principal Problem:   Alcohol withdrawal delirium (HCC) Active Problems:   Chronic alcohol abuse   Elevated liver enzymes   Thrombocytopenia (HCC)   Hepatic steatosis   Anxiety and depression    Hospital Course: 56 year old male with history of chronic alcoholism, multiple admissions in the past for alcohol withdrawal, depression, suicidal ideation, most recently discharged on 11/30 /23 after being cleared from psychiatry presents with alcohol withdrawal, tremor, hallucination. On presentation, he was hypertensive, tachycardic, tremulous. Alcohol level of 257. Started on CIWA protocol with as needed Ativan.  PCCM consulted and started phenobarbital taper.  There was concern about suicidal ideation and psychiatry was consulted.  Psychiatry recommended IVC and inpatient psych hospitalization once medically cleared.    Assessment and Plan:  Acute alcohol withdrawal with complication: Reportedly sober for 5 months prior to last month of binge drinking.  He was also attending [facility] a week[facility].  Reports binge drinking prior to admission. No hallucinations or delusions.  UDS positive for benzos and barbiturates. - stop the phenobarbital after today's dose. No withdrawals at this time.  -Continue multivitamin, thiamine and folic acid -  Continue thiamine folic acid. TOC  consulted   Elevated liver enzymes/hepatic steatosis: Chronic elevation of liver enzymes but worsened on this admission.  ALP and total bili within normal.  Improving. -recommend liver panel in 4 weeks.    Anxiety/depression/suicidal  ideation:   Psych reconsulted and recommended IVC and inpatient psychiatric hospitalization once medically clear. 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  -Outpatient follow-up with GI for colonoscopy to assess burden of diverticulosis   Generalized weakness:  -PT/OT   Thrombocytopenia: Likely from alcohol.  Improving.  Hypokalemia and hyponatremia: Likely from alcohol.  Resolved.   Increased nutrient needs Body mass index is 27.87 kg/m. Nutrition Problem: Increased nutrient needs Etiology: chronic illness (alcoholism) Signs/Symptoms: other (comment) (need for folic acid, thiamine, and multivitamin supplementation) Interventions: Refer to RD note for recommendations, MVI        Consultants: psychiatry.  Procedures performed: none.   Disposition:  [place] Diet recommendation:  Discharge Diet Orders (From admission, onward)     Start     Ordered   05/20/22 0000  Diet - low sodium heart healthy        05/20/22 1138           Regular diet DISCHARGE MEDICATION: Allergies as of 05/20/2022   No Known Allergies      Medication List     STOP taking these medications    cyanocobalamin 1000 MCG tablet Commonly known as: VITAMIN B12   gabapentin 100 MG capsule Commonly known as: Neurontin       TAKE these medications    escitalopram 20 MG tablet Commonly known as: LEXAPRO Take 20 mg by mouth daily.   folic acid 1 MG tablet Commonly known as: FOLVITE Take 1 tablet (1 mg total) by mouth daily.   hydrOXYzine 25 MG tablet Commonly known as: ATARAX  Take 1 tablet (25 mg total) by mouth 3 (three) times daily as needed for itching or anxiety.   multivitamin with minerals Tabs tablet Take 1 tablet by mouth daily.   thiamine 100 MG tablet Commonly known as: Vitamin B-1 Take 1 tablet (100 mg total) by mouth daily.         Discharge Exam: Filed Weights   05/12/22 2340  Weight: 88.1 kg   General exam: Appears calm and comfortable  Respiratory system: Clear to auscultation. Respiratory effort normal. Cardiovascular system: S1 & S2 heard, RRR. No JVD,  Gastrointestinal system: Abdomen is nondistended, soft and nontender.  Central nervous system: Alert and oriented. No focal neurological deficits. Extremities: Symmetric 5 x 5 power. Skin: No rashes, lesions or ulcers Psychiatry:  Mood & affect appropriate.    Condition at discharge: fair  The results of significant diagnostics from this hospitalization (including imaging, microbiology, ancillary and laboratory) are listed below for reference.   Imaging Studies: US Abdomen Limited RUQ (LIVER/GB)  Result Date: 05/12/2022 CLINICAL DATA:  Right upper quadrant pain EXAM: ULTRASOUND ABDOMEN LIMITED RIGHT UPPER QUADRANT COMPARISON:  CT 04/22/2022 FINDINGS: Gallbladder: No shadowing stones. Gallbladder slightly dilated. Normal wall thickness. Negative sonographic Murphy Common bile duct: Diameter: 3.3 mm Liver: Diffusely echogenic. No focal hepatic abnormality is seen portal vein is patent on color Doppler imaging with normal direction of blood flow towards the liver. Other: None. IMPRESSION: 1. Negative for gallstones or biliary dilatation. 2. Echogenic liver parenchyma suggesting hepatic steatosis. Electronically Signed   By: Jasmine Pang M.D.   On: 05/12/2022 21:04   CT Abdomen Pelvis W Contrast  Result Date: 04/22/2022 CLINICAL DATA:  Abdominal pain. EXAM: CT ABDOMEN AND PELVIS WITH CONTRAST TECHNIQUE: Multidetector CT imaging of the abdomen and pelvis was performed using the standard protocol following bolus administration of intravenous contrast. RADIATION DOSE REDUCTION: This exam was performed according to the departmental dose-optimization program which includes automated exposure control, adjustment of the mA and/or kV according to patient size  and/or use of iterative reconstruction technique. CONTRAST:  OMNIPAQUE IOHEXOL 300 MG/ML  SOLN COMPARISON:  None Available. FINDINGS: Lower chest: No acute abnormality. Hepatobiliary: There is diffuse fatty infiltration of the liver parenchyma. A 7 mm focus of parenchymal low attenuation is seen within the right of the liver (axial CT image 14, CT series 2). The gallbladder is moderately distended. No gallstones, gallbladder wall thickening, or biliary dilatation. Pancreas: Unremarkable. No pancreatic ductal dilatation or surrounding inflammatory changes. Spleen: Normal in size without focal abnormality. Adrenals/Urinary Tract: Adrenal glands are unremarkable. Kidneys are normal, without renal calculi, focal lesion, or hydronephrosis. The urinary bladder is moderately distended and is otherwise unremarkable. Stomach/Bowel: Stomach is within normal limits. Appendix appears normal. No evidence of bowel wall thickening, distention, or inflammatory changes. Numerous diverticula are seen throughout the transverse, descending and sigmoid colon. Mild thickening of the proximal sigmoid colon is also seen. Vascular/Lymphatic: Aortic atherosclerosis. No enlarged abdominal or pelvic lymph nodes. Reproductive: The prostate gland is mildly enlarged. Other: A 2.1 cm x 1.7 cm fat containing scrotal hernia is seen on the right. No abdominopelvic ascites. Musculoskeletal: No acute or significant osseous findings. IMPRESSION: 1. Colonic diverticulosis with mild thickening of the proximal sigmoid colon which may represent mild diverticulitis. 2. Hepatic steatosis. 3. Findings which may represent a small hepatic cyst versus hemangioma. Correlation with nonemergent hepatic ultrasound is recommended. 4. Aortic atherosclerosis. Aortic Atherosclerosis (ICD10-I70.0). Electronically Signed   By: Aram Candela M.D.   On: 04/22/2022 19:47  DG Chest 2 View  Result Date: 04/22/2022 CLINICAL DATA:  Suicidal EXAM: CHEST - 2 VIEW  COMPARISON:  09/22/2021 FINDINGS: The heart size and mediastinal contours are within normal limits. Both lungs are clear. The visualized skeletal structures are unremarkable. IMPRESSION: No acute abnormality of the lungs. Electronically Signed   By: Jearld Lesch M.D.   On: 04/22/2022 15:57    Microbiology: Results for orders placed or performed during the hospital encounter of 04/22/22  Resp Panel by RT-PCR (Flu A&B, Covid) Anterior Nasal Swab     Status: None   Collection Time: 04/22/22  9:25 PM   Specimen: Anterior Nasal Swab  Result Value Ref Range Status   SARS Coronavirus 2 by RT PCR NEGATIVE NEGATIVE Final    Comment: (NOTE) SARS-CoV-2 target nucleic acids are NOT DETECTED.  The SARS-CoV-2 RNA is generally detectable in upper respiratory specimens during the acute phase of infection. The lowest concentration of SARS-CoV-2 viral copies this assay can detect is 138 copies/mL. A negative result does not preclude SARS-Cov-2 infection and should not be used as the sole basis for treatment or other patient management decisions. A negative result may occur with  improper specimen collection/handling, submission of specimen other than nasopharyngeal swab, presence of viral mutation(s) within the areas targeted by this assay, and inadequate number of viral copies(<138 copies/mL). A negative result must be combined with clinical observations, patient history, and epidemiological information. The expected result is Negative.  Fact Sheet for Patients:  BloggerCourse.com  Fact Sheet for Healthcare Providers:  SeriousBroker.it  This test is no t yet approved or cleared by the [place] FDA and  has been authorized for detection and/or diagnosis of SARS-CoV-2 by FDA under an Emergency Use Authorization (EUA). This EUA will remain  in effect (meaning this test can be used) for the duration of the COVID-19 declaration under Section  564(b)(1) of the Act, 21 U.S.C.section 360bbb-3(b)(1), unless the authorization is terminated  or revoked sooner.       Influenza A by PCR NEGATIVE NEGATIVE Final   Influenza B by PCR NEGATIVE NEGATIVE Final    Comment: (NOTE) The Xpert Xpress SARS-CoV-2/FLU/RSV plus assay is intended as an aid in the diagnosis of influenza from Nasopharyngeal swab specimens and should not be used as a sole basis for treatment. Nasal washings and aspirates are unacceptable for Xpert Xpress SARS-CoV-2/FLU/RSV testing.  Fact Sheet for Patients: BloggerCourse.com  Fact Sheet for Healthcare Providers: SeriousBroker.it  This test is not yet approved or cleared by the [place] FDA and has been authorized for detection and/or diagnosis of SARS-CoV-2 by FDA under an Emergency Use Authorization (EUA). This EUA will remain in effect (meaning this test can be used) for the duration of the COVID-19 declaration under Section 564(b)(1) of the Act, 21 U.S.C. section 360bbb-3(b)(1), unless the authorization is terminated or revoked.  Performed at [facility], [address], [place], [place] [address]     Labs: CBC: Recent Labs  Lab 05/16/22 0418 05/17/22 0352 05/19/22 0519  WBC 5.1 5.1 6.3  HGB 14.3 15.0 14.5  HCT 44.0 46.2 44.2  MCV 94.4 94.7 94.6  PLT 78* 88* 138*   Basic Metabolic Panel: Recent Labs  Lab 05/14/22 1057 05/15/22 0446 05/16/22 0418 05/17/22 0352 05/19/22 0519  NA 137 138 137 137 137  K 3.9 3.3* 4.1 4.2 4.3  CL 101 108 101 102 100  CO2 26 23 26 27 30   GLUCOSE 129* 87 99 96 83  BUN 17 10 15  17 26*  CREATININE 0.79 0.58* 0.70 0.81 0.94  CALCIUM 9.4 7.6* 9.0 8.9 9.6  MG  --   --   --  2.2 2.5*  PHOS  --   --   --  3.7 3.5   Liver Function Tests: Recent Labs  Lab 05/14/22 0447 05/15/22 0446 05/16/22 0418 05/17/22 0352 05/19/22 0519  AST 298* 283* 244* 164* 85*  ALT 232* 267* 300* 257* 193*   ALKPHOS 31* 42 53 51 76  BILITOT 1.0 0.6 0.6 0.7 0.5  PROT 4.6* 5.4* 6.5 6.2* 6.9  ALBUMIN 2.8* 3.2* 3.4* 3.5 4.0   CBG: No results for input(s): "GLUCAP" in the last 168 hours.  Discharge time spent: 39 minutes.   Signed: Hosie Poisson, MD [facility] 05/20/2022

## 2022-05-20 NOTE — TOC Progression Note (Signed)
Transition of Care [facility]) - Progression Note    Patient Details  Name: Jermaine [name] MRN: [id number] Date of Birth: [birth year]  Transition of Care [facility]) CM/SW Contact  Amada Jupiter, LCSW Phone Number: 05/20/2022, 2:16 PM  Clinical Narrative:     Pt medically cleared for dc today and inpatient psychiatry bed secured with [facility].  Unfortunately, Sheriff cannot provide dc transport until tomorrow.  [facility] aware and has cleared pt for admit tomorrow.  This CSW will follow up in the morning will transport and facility to confirm we are good to go.  MD aware.  Expected Discharge Plan: Home/Self Care Barriers to Discharge: Continued Medical Work up  Expected Discharge Plan and Services   Discharge Planning Services: CM Consult   Living arrangements for the past 2 months: Single Family Home Expected Discharge Date: 05/20/22                                     Social Determinants of Health (SDOH) Interventions SDOH Screenings   Food Insecurity: No Food Insecurity (04/23/2022)  Housing: Low Risk  (05/13/2022)  Transportation Needs: No Transportation Needs (05/13/2022)  Utilities: Not At Risk (05/13/2022)  Tobacco Use: Low Risk  (05/12/2022)    Readmission Risk Interventions     No data to display

## 2022-05-20 NOTE — Progress Notes (Signed)
Per Dr. Thalia Party, patient meets criteria for inpatient treatment. There are no available beds at [facility] today. CSW faxed referrals out of network.    Jermaine [name], MSW, Lenice Pressman Phone: [phone] Disposition/TOC

## 2022-05-20 NOTE — Progress Notes (Signed)
Per Vito Backers, [facility] admissions, pt has been accepted to [facility], main campus. Accepting provider is Dr. Estill Cotta. Patient can arrive by anytime. Number for report is [phone]. Please fax IVC paperwork to fax number [phone] before calling report.    Crissie Reese, MSW, LCSW-A Phone: [phone] Disposition/TOC

## 2022-05-21 DIAGNOSIS — F419 Anxiety disorder, unspecified: Secondary | ICD-10-CM | POA: Diagnosis not present

## 2022-05-21 DIAGNOSIS — K760 Fatty (change of) liver, not elsewhere classified: Secondary | ICD-10-CM | POA: Diagnosis not present

## 2022-05-21 DIAGNOSIS — F1010 Alcohol abuse, uncomplicated: Secondary | ICD-10-CM | POA: Diagnosis not present

## 2022-05-21 DIAGNOSIS — F10931 Alcohol use, unspecified with withdrawal delirium: Secondary | ICD-10-CM | POA: Diagnosis not present

## 2022-05-21 MED ORDER — LORAZEPAM 1 MG PO TABS
1.0000 mg | ORAL_TABLET | Freq: Four times a day (QID) | ORAL | Status: DC | PRN
  Administered 2022-05-21: 2 mg via ORAL
  Administered 2022-05-21 (×2): 1 mg via ORAL
  Administered 2022-05-22: 2 mg via ORAL
  Filled 2022-05-21: qty 2
  Filled 2022-05-21 (×2): qty 1
  Filled 2022-05-21: qty 2

## 2022-05-21 MED ORDER — LORAZEPAM 0.5 MG PO TABS
0.5000 mg | ORAL_TABLET | Freq: Four times a day (QID) | ORAL | Status: DC | PRN
  Administered 2022-05-21: 0.5 mg via ORAL
  Filled 2022-05-21: qty 1

## 2022-05-21 MED ORDER — LORAZEPAM 1 MG PO TABS
1.0000 mg | ORAL_TABLET | Freq: Four times a day (QID) | ORAL | Status: DC | PRN
  Administered 2022-05-21: 0.5 mg via ORAL
  Filled 2022-05-21: qty 1

## 2022-05-21 NOTE — TOC Progression Note (Signed)
Transition of Care [facility]) - Progression Note    Patient Details  Name: Jermaine [name] MRN: [id number] Date of Birth: 1965-11-04  Transition of Care [facility]) CM/SW Contact  Amada Jupiter, LCSW Phone Number: 05/21/2022, 8:25 AM  Clinical Narrative:     Alerted by Caryn Bee, NP that she has spoken with [facility] this morning and informed they are unable to transport pt to [facility].  Plan to dc tomorrow.  Treatment team aware.  Will notify [facility].  Expected Discharge Plan: Home/Self Care Barriers to Discharge: Continued Medical Work up  Expected Discharge Plan and Services   Discharge Planning Services: CM Consult   Living arrangements for the past 2 months: Single Family Home Expected Discharge Date: 05/20/22                                     Social Determinants of Health (SDOH) Interventions SDOH Screenings   Food Insecurity: No Food Insecurity (04/23/2022)  Housing: Low Risk  (05/13/2022)  Transportation Needs: No Transportation Needs (05/13/2022)  Utilities: Not At Risk (05/13/2022)  Tobacco Use: Low Risk  (05/12/2022)    Readmission Risk Interventions     No data to display

## 2022-05-21 NOTE — Progress Notes (Signed)
Pt c/o feeling anxious and asking where his belonging are stored. PRN Atarax administered. Pt w/ continued anxiety and asking 'who will stop him if he gets up and leaves?' MD notified and new orders received. Pt remained agitated and asked to walk around on the dept. Consulting civil engineer and security notified as well. Meds adjusted by MD and security escorted pt around unit. Will continue to monitor.

## 2022-05-21 NOTE — Progress Notes (Signed)
[facility]                                                                               Jermaine [name], is a 56 y.o. male, DOB - 1965/10/06, LKG:[id number] Admit date - 05/12/2022    Outpatient Primary MD for the patient is Farris Has, MD  LOS - 9  days    Brief summary   56 year old male with history of chronic alcoholism, multiple admissions in the past for alcohol withdrawal, depression, suicidal ideation, most recently discharged on 11/30 /23 after being cleared from psychiatry presents with alcohol withdrawal, tremor, hallucination. On presentation, he was hypertensive, tachycardic, tremulous. Alcohol level of 257. Started on CIWA protocol with as needed Ativan. PCCM consulted and started phenobarbital taper. There was concern about suicidal ideation and psychiatry was consulted. Psychiatry recommended IVC and inpatient psych hospitalization once medically cleared.  Patient is medically stable for dsicharge.   Assessment & Plan    Assessment and Plan:  Acute alcohol withdrawal with complication: Reportedly sober for 5 months prior to last month of binge drinking.  He was also attending AA 5 times a week[facility]. no hallucinations or tremors. He is requesting for ativan scheduled.    UDS positive for benzos and barbiturates.  CIWA remains elevated although he is 6 days out now. -Appreciate help by PCCM-started phenobarbital taper. Completed taper.  -Continue with ativan prn.  Pt medically stable for discharge.    Elevated liver enzymes/hepatic steatosis: Chronic elevation of liver enzymes but worsened on this admission.  ALP and total bili within normal.  Improving.  Recommend checking liver enzymes in 4 weeks.    Anxiety/depression/suicidal ideation: "Patient denied this but the sister called said  his friend found a noose hung in patient's house with a chair under it like patient is prepared to commit suicide".  Psych reconsulted and recommended IVC and  inpatient psychiatric hospitalization once medically clear. -IVC paperwork signed by psychiatry and faxed to magistrate on 12/21 -Continue one-to-one safety sitter -Continue Lexapro.   Patient history of diverticulitis/chronic diarrhea: Was admitted and managed for this on last admission.abdominal exam benign. -Outpatient follow-up with GI for colonoscopy to assess burden of diverticulosis -Start Imodium as needed.   Generalized weakness:  -PT/OT   Thrombocytopenia: Likely from alcohol.  Improving.  Hypokalemia and hyponatremia: Likely from alcohol.  Resolved.   Increased nutrient needs Body mass index is 27.87 kg/m. Nutrition Problem: Increased nutrient needs Etiology: chronic illness (alcoholism) Signs/Symptoms: other (comment) (need for folic acid, thiamine, and multivitamin supplementation) Interventions: Refer to RD note for recommendations, MVI   RN Pressure Injury Documentation:    Malnutrition Type:  Nutrition Problem: Increased nutrient needs Etiology: chronic illness (alcoholism)   Malnutrition Characteristics:  Signs/Symptoms: other (comment) (need for folic acid, thiamine, and multivitamin supplementation)   Nutrition Interventions:  Interventions: Refer to RD note for recommendations, MVI  Estimated body mass index is 27.87 kg/m as calculated from the following:   Height as of this encounter: 5\' 10"  (1.778 m).   Weight as of this encounter: 88.1 kg.  Code Status: full code.  DVT Prophylaxis:  Place and maintain sequential compression device Start: 05/16/22 0745   Level  of Care: Level of care: Med-Surg Family Communication: NONE AT bedside.   Disposition Plan:     Remains inpatient appropriate:  possible discharge in am.   Procedures:  None.   Consultants:   Psychiatry.   Antimicrobials:   Anti-infectives (From admission, onward)    None        Medications  Scheduled Meds:  escitalopram  20 mg Oral Daily   folic acid  1 mg Oral  Daily   multivitamin with minerals  1 tablet Oral Daily   thiamine  100 mg Oral Daily   Or   thiamine  100 mg Intravenous Daily   Continuous Infusions: PRN Meds:.acetaminophen, hydrOXYzine, loperamide, LORazepam, mouth rinse    Subjective:   Jermaine [name] was seen and examined today.   Anxious requesting for ativan IV.   Objective:   Vitals:   05/20/22 0712 05/20/22 2138 05/21/22 0424 05/21/22 1346  BP: 121/84 109/67 126/87 103/72  Pulse: 95 71 78 91  Resp: 18 17 17  (!) 21  Temp: 99 F (37.2 C) 98 F (36.7 C) 98 F (36.7 C) 99 F (37.2 C)  TempSrc: Oral Oral Oral Oral  SpO2: 97% 97% 100% 97%  Weight:      Height:        Intake/Output Summary (Last 24 hours) at 05/21/2022 1547 Last data filed at 05/21/2022 1151 Gross per 24 hour  Intake 240 ml  Output --  Net 240 ml    Filed Weights   05/12/22 2340  Weight: 88.1 kg     Exam General exam: Appears calm and comfortable  Respiratory system: Clear to auscultation. Respiratory effort normal. Cardiovascular system: S1 & S2 heard, RRR. No JVD, murmurs, rubs, gallops or clicks. No pedal edema. Gastrointestinal system: Abdomen is nondistended, soft and nontender. No organomegaly or masses felt. Normal bowel sounds heard. Central nervous system: Alert and oriented. No focal neurological deficits. Extremities: Symmetric 5 x 5 power. Skin: No rashes, lesions or ulcers Psychiatry: Judgement and insight appear normal. Mood & affect appropriate.     Data Reviewed:  I have personally reviewed following labs and imaging studies   CBC Lab Results  Component Value Date   WBC 6.3 05/19/2022   RBC 4.67 05/19/2022   HGB 14.5 05/19/2022   HCT 44.2 05/19/2022   MCV 94.6 05/19/2022   MCH 31.0 05/19/2022   PLT 138 (L) 05/19/2022   MCHC 32.8 05/19/2022   RDW 16.1 (H) 05/19/2022   LYMPHSABS 1.8 04/22/2022   MONOABS 0.5 04/22/2022   EOSABS 0.0 04/22/2022   BASOSABS 0.0 [id number]     Last metabolic panel Lab Results   Component Value Date   NA 137 05/19/2022   K 4.3 05/19/2022   CL 100 05/19/2022   CO2 30 05/19/2022   BUN 26 (H) 05/19/2022   CREATININE 0.94 05/19/2022   GLUCOSE 83 05/19/2022   GFRNONAA >60 05/19/2022   GFRAA >60 03/20/2019   CALCIUM 9.6 05/19/2022   PHOS 3.5 05/19/2022   PROT 6.9 05/19/2022   ALBUMIN 4.0 05/19/2022   BILITOT 0.5 05/19/2022   ALKPHOS 76 05/19/2022   AST 85 (H) 05/19/2022   ALT 193 (H) 05/19/2022   ANIONGAP 7 05/19/2022    CBG (last 3)  No results for input(s): "GLUCAP" in the last 72 hours.    Coagulation Profile: No results for input(s): "INR", "PROTIME" in the last 168 hours.    Radiology Studies: No results found.     Hosie Poisson M.D. [facility] 05/21/2022, 3:47 PM  Available via Epic secure chat 7am-7pm After 7 pm, please refer to night coverage provider listed on amion.

## 2022-05-22 DIAGNOSIS — R748 Abnormal levels of other serum enzymes: Secondary | ICD-10-CM | POA: Diagnosis not present

## 2022-05-22 DIAGNOSIS — F1010 Alcohol abuse, uncomplicated: Secondary | ICD-10-CM | POA: Diagnosis not present

## 2022-05-22 DIAGNOSIS — F419 Anxiety disorder, unspecified: Secondary | ICD-10-CM | POA: Diagnosis not present

## 2022-05-22 DIAGNOSIS — F10931 Alcohol use, unspecified with withdrawal delirium: Secondary | ICD-10-CM | POA: Diagnosis not present

## 2022-05-22 MED ORDER — LORAZEPAM 1 MG PO TABS: 1.0000 mg | ORAL_TABLET | Freq: Four times a day (QID) | ORAL | 0 refills | Status: DC | PRN

## 2022-05-22 MED ORDER — LORAZEPAM 1 MG PO TABS
1.0000 mg | ORAL_TABLET | Freq: Once | ORAL | Status: AC
  Administered 2022-05-22: 1 mg via ORAL
  Filled 2022-05-22: qty 1

## 2022-05-22 NOTE — Progress Notes (Signed)
Patient discharged in W/C with Emergency planning/management officer. All belongings sent in patient belongings bags with officer. Pt wearing his clothes only, glasses on shirt. Cell phone sent with [facility], along with keys. D/C paperwork sent in packet with [facility]. Report called to RN Varney Biles prior to D/C. Patient alert and cooperative.

## 2022-05-22 NOTE — Discharge Summary (Signed)
Physician Discharge Summary   Patient: Jermaine [name] MRN: [id number] DOB: [birth year]  Admit date:     05/12/2022  Discharge date: 05/22/22  Discharge Physician: [name]   PCP: Farris Has, MD   Recommendations at discharge:  Please follow up with PCP in one week.  Recommend follow up with GI in 4 weeks.  Please cbc and BMP in one week.   Discharge Diagnoses: Principal Problem:   Alcohol withdrawal delirium (HCC) Active Problems:   Chronic alcohol abuse   Elevated liver enzymes   Thrombocytopenia (HCC)   Hepatic steatosis   Anxiety and depression    Hospital Course: 56 year old male with history of chronic alcoholism, multiple admissions in the past for alcohol withdrawal, depression, suicidal ideation, most recently discharged on 11/30 /23 after being cleared from psychiatry presents with alcohol withdrawal, tremor, hallucination. On presentation, he was hypertensive, tachycardic, tremulous. Alcohol level of 257. Started on CIWA protocol with as needed Ativan.  PCCM consulted and started phenobarbital taper.  There was concern about suicidal ideation and psychiatry was consulted.  Psychiatry recommended IVC and inpatient psych hospitalization once medically cleared.    Assessment and Plan:  Acute alcohol withdrawal with complication: Reportedly sober for 5 months prior to last month of binge drinking.  He was also attending [facility] a [facility].  Reports binge drinking prior to admission. No hallucinations or delusions.  UDS positive for benzos and barbiturates. - stop the phenobarbital after today's dose. No withdrawals at this time.  -Continue multivitamin, thiamine and folic acid -  Continue thiamine folic acid. TOC  consulted   Elevated liver enzymes/hepatic steatosis: Chronic elevation of liver enzymes but worsened on this admission.  ALP and total bili within normal.  Improving. -recommend liver panel in 4 weeks.    Anxiety/depression/suicidal  ideation:   Psych reconsulted and recommended IVC and inpatient psychiatric hospitalization once medically clear. -Continue one-to-one Recruitment consultant -Continue home meds for now   Patient history of diverticulitis: Was admitted and managed for this on last admission.States he has chronic diarrhea .  -Outpatient follow-up with GI for colonoscopy to assess burden of diverticulosis   Generalized weakness:  -PT/OT   Thrombocytopenia: Likely from alcohol.  Improving.  Hypokalemia and hyponatremia: Likely from alcohol.  Resolved.   Increased nutrient needs Body mass index is 27.87 kg/m. Nutrition Problem: Increased nutrient needs Etiology: chronic illness (alcoholism) Signs/Symptoms: other (comment) (need for folic acid, thiamine, and multivitamin supplementation) Interventions: Refer to RD note for recommendations, MVI        Consultants: psychiatry.  Procedures performed: none.   Disposition:  [facility] Diet recommendation:  Discharge Diet Orders (From admission, onward)     Start     Ordered   05/20/22 0000  Diet - low sodium heart healthy        05/20/22 1138           Regular diet DISCHARGE MEDICATION: Allergies as of 05/22/2022   No Known Allergies      Medication List     STOP taking these medications    cyanocobalamin 1000 MCG tablet Commonly known as: VITAMIN B12   gabapentin 100 MG capsule Commonly known as: Neurontin       TAKE these medications    escitalopram 20 MG tablet Commonly known as: LEXAPRO Take 20 mg by mouth daily.   folic acid 1 MG tablet Commonly known as: FOLVITE Take 1 tablet (1 mg total) by mouth daily.   hydrOXYzine 25 MG tablet Commonly known as: ATARAX  Take 1 tablet (25 mg total) by mouth 3 (three) times daily as needed for itching or anxiety.   LORazepam 1 MG tablet Commonly known as: ATIVAN Take 1 tablet (1 mg total) by mouth every 6 (six) hours as needed for anxiety.   multivitamin with minerals Tabs tablet Take 1  tablet by mouth daily.   thiamine 100 MG tablet Commonly known as: Vitamin B-1 Take 1 tablet (100 mg total) by mouth daily.        Discharge Exam: Filed Weights   05/12/22 2340  Weight: 88.1 kg   General exam: Appears calm and comfortable  Respiratory system: Clear to auscultation. Respiratory effort normal. Cardiovascular system: S1 & S2 heard, RRR. No JVD,  Gastrointestinal system: Abdomen is nondistended, soft and nontender.  Central nervous system: Alert and oriented. No focal neurological deficits. Extremities: Symmetric 5 x 5 power. Skin: No rashes, lesions or ulcers Psychiatry:  Mood & affect appropriate.    Condition at discharge: fair  The results of significant diagnostics from this hospitalization (including imaging, microbiology, ancillary and laboratory) are listed below for reference.   Imaging Studies: US Abdomen Limited RUQ (LIVER/GB)  Result Date: 05/12/2022 CLINICAL DATA:  Right upper quadrant pain EXAM: ULTRASOUND ABDOMEN LIMITED RIGHT UPPER QUADRANT COMPARISON:  CT 04/22/2022 FINDINGS: Gallbladder: No shadowing stones. Gallbladder slightly dilated. Normal wall thickness. Negative sonographic Murphy Common bile duct: Diameter: 3.3 mm Liver: Diffusely echogenic. No focal hepatic abnormality is seen portal vein is patent on color Doppler imaging with normal direction of blood flow towards the liver. Other: None. IMPRESSION: 1. Negative for gallstones or biliary dilatation. 2. Echogenic liver parenchyma suggesting hepatic steatosis. Electronically Signed   By: Jasmine Pang M.D.   On: 05/12/2022 21:04   CT Abdomen Pelvis W Contrast  Result Date: 04/22/2022 CLINICAL DATA:  Abdominal pain. EXAM: CT ABDOMEN AND PELVIS WITH CONTRAST TECHNIQUE: Multidetector CT imaging of the abdomen and pelvis was performed using the standard protocol following bolus administration of intravenous contrast. RADIATION DOSE REDUCTION: This exam was performed according to the  departmental dose-optimization program which includes automated exposure control, adjustment of the mA and/or kV according to patient size and/or use of iterative reconstruction technique. CONTRAST:  OMNIPAQUE IOHEXOL 300 MG/ML  SOLN COMPARISON:  None Available. FINDINGS: Lower chest: No acute abnormality. Hepatobiliary: There is diffuse fatty infiltration of the liver parenchyma. A 7 mm focus of parenchymal low attenuation is seen within the right of the liver (axial CT image 14, CT series 2). The gallbladder is moderately distended. No gallstones, gallbladder wall thickening, or biliary dilatation. Pancreas: Unremarkable. No pancreatic ductal dilatation or surrounding inflammatory changes. Spleen: Normal in size without focal abnormality. Adrenals/Urinary Tract: Adrenal glands are unremarkable. Kidneys are normal, without renal calculi, focal lesion, or hydronephrosis. The urinary bladder is moderately distended and is otherwise unremarkable. Stomach/Bowel: Stomach is within normal limits. Appendix appears normal. No evidence of bowel wall thickening, distention, or inflammatory changes. Numerous diverticula are seen throughout the transverse, descending and sigmoid colon. Mild thickening of the proximal sigmoid colon is also seen. Vascular/Lymphatic: Aortic atherosclerosis. No enlarged abdominal or pelvic lymph nodes. Reproductive: The prostate gland is mildly enlarged. Other: A 2.1 cm x 1.7 cm fat containing scrotal hernia is seen on the right. No abdominopelvic ascites. Musculoskeletal: No acute or significant osseous findings. IMPRESSION: 1. Colonic diverticulosis with mild thickening of the proximal sigmoid colon which may represent mild diverticulitis. 2. Hepatic steatosis. 3. Findings which may represent a small hepatic cyst versus hemangioma. Correlation with nonemergent  hepatic ultrasound is recommended. 4. Aortic atherosclerosis. Aortic Atherosclerosis (ICD10-I70.0). Electronically Signed   By:  Aram Candelahaddeus  Houston M.D.   On: 04/22/2022 19:47   DG Chest 2 View  Result Date: 04/22/2022 CLINICAL DATA:  Suicidal EXAM: CHEST - 2 VIEW COMPARISON:  09/22/2021 FINDINGS: The heart size and mediastinal contours are within normal limits. Both lungs are clear. The visualized skeletal structures are unremarkable. IMPRESSION: No acute abnormality of the lungs. Electronically Signed   By: Jearld LeschAlex D Bibbey M.D.   On: 04/22/2022 15:57    Microbiology: Results for orders placed or performed during the hospital encounter of 05/12/22  Resp panel by RT-PCR (RSV, Flu A&B, Covid) Anterior Nasal Swab     Status: None   Collection Time: 05/20/22  2:17 PM   Specimen: Anterior Nasal Swab  Result Value Ref Range Status   SARS Coronavirus 2 by RT PCR NEGATIVE NEGATIVE Final    Comment: (NOTE) SARS-CoV-2 target nucleic acids are NOT DETECTED.  The SARS-CoV-2 RNA is generally detectable in upper respiratory specimens during the acute phase of infection. The lowest concentration of SARS-CoV-2 viral copies this assay can detect is 138 copies/mL. A negative result does not preclude SARS-Cov-2 infection and should not be used as the sole basis for treatment or other patient management decisions. A negative result may occur with  improper specimen collection/handling, submission of specimen other than nasopharyngeal swab, presence of viral mutation(s) within the areas targeted by this assay, and inadequate number of viral copies(<138 copies/mL). A negative result must be combined with clinical observations, patient history, and epidemiological information. The expected result is Negative.  Fact Sheet for Patients:  BloggerCourse.comhttps://www.fda.gov/media/152166/download  Fact Sheet for Healthcare Providers:  SeriousBroker.ithttps://www.fda.gov/media/152162/download  This test is no t yet approved or cleared by the [place] FDA and  has been authorized for detection and/or diagnosis of SARS-CoV-2 by FDA under an Emergency Use  Authorization (EUA). This EUA will remain  in effect (meaning this test can be used) for the duration of the COVID-19 declaration under Section 564(b)(1) of the Act, 21 U.S.C.section 360bbb-3(b)(1), unless the authorization is terminated  or revoked sooner.       Influenza A by PCR NEGATIVE NEGATIVE Final   Influenza B by PCR NEGATIVE NEGATIVE Final    Comment: (NOTE) The Xpert Xpress SARS-CoV-2/FLU/RSV plus assay is intended as an aid in the diagnosis of influenza from Nasopharyngeal swab specimens and should not be used as a sole basis for treatment. Nasal washings and aspirates are unacceptable for Xpert Xpress SARS-CoV-2/FLU/RSV testing.  Fact Sheet for Patients: BloggerCourse.comhttps://www.fda.gov/media/152166/download  Fact Sheet for Healthcare Providers: SeriousBroker.ithttps://www.fda.gov/media/152162/download  This test is not yet approved or cleared by the [place] FDA and has been authorized for detection and/or diagnosis of SARS-CoV-2 by FDA under an Emergency Use Authorization (EUA). This EUA will remain in effect (meaning this test can be used) for the duration of the COVID-19 declaration under Section 564(b)(1) of the Act, 21 U.S.C. section 360bbb-3(b)(1), unless the authorization is terminated or revoked.     Resp Syncytial Virus by PCR NEGATIVE NEGATIVE Final    Comment: (NOTE) Fact Sheet for Patients: BloggerCourse.comhttps://www.fda.gov/media/152166/download  Fact Sheet for Healthcare Providers: SeriousBroker.ithttps://www.fda.gov/media/152162/download  This test is not yet approved or cleared by the [place] FDA and has been authorized for detection and/or diagnosis of SARS-CoV-2 by FDA under an Emergency Use Authorization (EUA). This EUA will remain in effect (meaning this test can be used) for the duration of the COVID-19 declaration under Section 564(b)(1) of the Act, 21  U.S.C. section 360bbb-3(b)(1), unless the authorization is terminated or revoked.  Performed at [facility],  [address] [place], [place] [address]     Labs: CBC: Recent Labs  Lab 05/16/22 0418 05/17/22 0352 05/19/22 0519  WBC 5.1 5.1 6.3  HGB 14.3 15.0 14.5  HCT 44.0 46.2 44.2  MCV 94.4 94.7 94.6  PLT 78* 88* 138*   Basic Metabolic Panel: Recent Labs  Lab 05/16/22 0418 05/17/22 0352 05/19/22 0519  NA 137 137 137  K 4.1 4.2 4.3  CL 101 102 100  CO2 26 27 30   GLUCOSE 99 96 83  BUN 15 17 26*  CREATININE 0.70 0.81 0.94  CALCIUM 9.0 8.9 9.6  MG  --  2.2 2.5*  PHOS  --  3.7 3.5   Liver Function Tests: Recent Labs  Lab 05/16/22 0418 05/17/22 0352 05/19/22 0519  AST 244* 164* 85*  ALT 300* 257* 193*  ALKPHOS 53 51 76  BILITOT 0.6 0.7 0.5  PROT 6.5 6.2* 6.9  ALBUMIN 3.4* 3.5 4.0   CBG: No results for input(s): "GLUCAP" in the last 168 hours.  Discharge time spent: 39 minutes.   Signed: 05/21/22, MD [facility] 05/22/2022

## 2022-05-22 NOTE — TOC Transition Note (Signed)
Transition of Care [facility]) - CM/SW Discharge Note   Patient Details  Name: Jermaine [name] MRN: [id number] Date of Birth: [birth year]  Transition of Care [facility] CM/SW Contact:  Amada Jupiter, LCSW Phone Number: 05/22/2022, 9:58 AM   Clinical Narrative:     Have confirmed with [facility] that pt is cleared for admission today.  Confirmed with [facility] that transportation is ready today as well with anticipated patient pick up ~ 11:00am.  RN to call report to [phone].  MD/RN aware and no further TOC needs.    Final next level of care: Psychiatric Hospital Barriers to Discharge: Barriers Resolved   Patient Goals and CMS Choice CMS Medicare.gov Compare Post Acute Care list provided to:: Patient Choice offered to / list presented to : Patient  Discharge Placement                         Discharge Plan and Services Additional resources added to the After Visit Summary for     Discharge Planning Services: CM Consult            DME Arranged: N/A DME Agency: NA                  Social Determinants of Health (SDOH) Interventions SDOH Screenings   Food Insecurity: No Food Insecurity (04/23/2022)  Housing: Low Risk  (05/13/2022)  Transportation Needs: No Transportation Needs (05/13/2022)  Utilities: Not At Risk (05/13/2022)  Tobacco Use: Low Risk  (05/12/2022)     Readmission Risk Interventions     No data to display

## 2022-09-12 ENCOUNTER — Other Ambulatory Visit: Payer: Self-pay | Admitting: Family Medicine

## 2022-09-12 ENCOUNTER — Ambulatory Visit: Payer: Self-pay

## 2022-09-12 DIAGNOSIS — S86912A Strain of unspecified muscle(s) and tendon(s) at lower leg level, left leg, initial encounter: Secondary | ICD-10-CM

## 2022-09-30 IMAGING — CR DG HAND COMPLETE 3+V*R*
3 series · 3 of 3 positions shown · non-contrast
Comparison: None

CLINICAL DATA: Swelling for 3 days.

EXAM:
RIGHT HAND - COMPLETE 3+ VIEW

[x hand pa right]
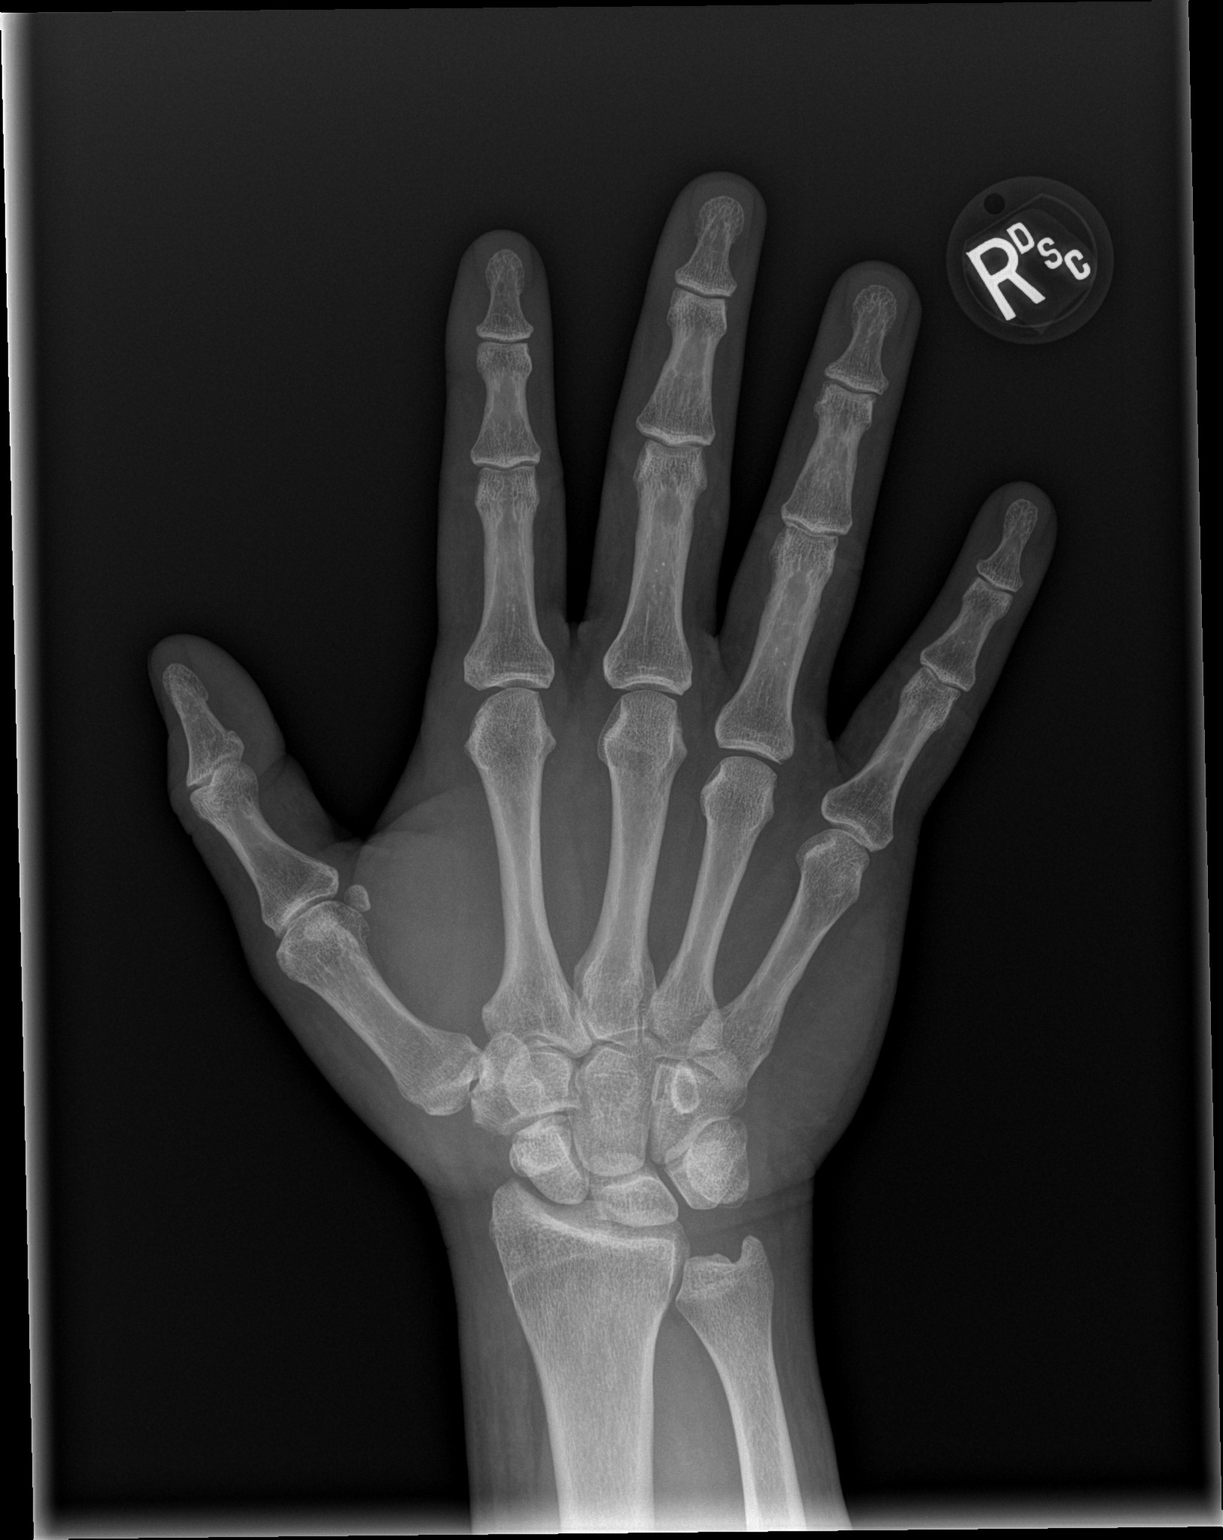

[x hand obl right]
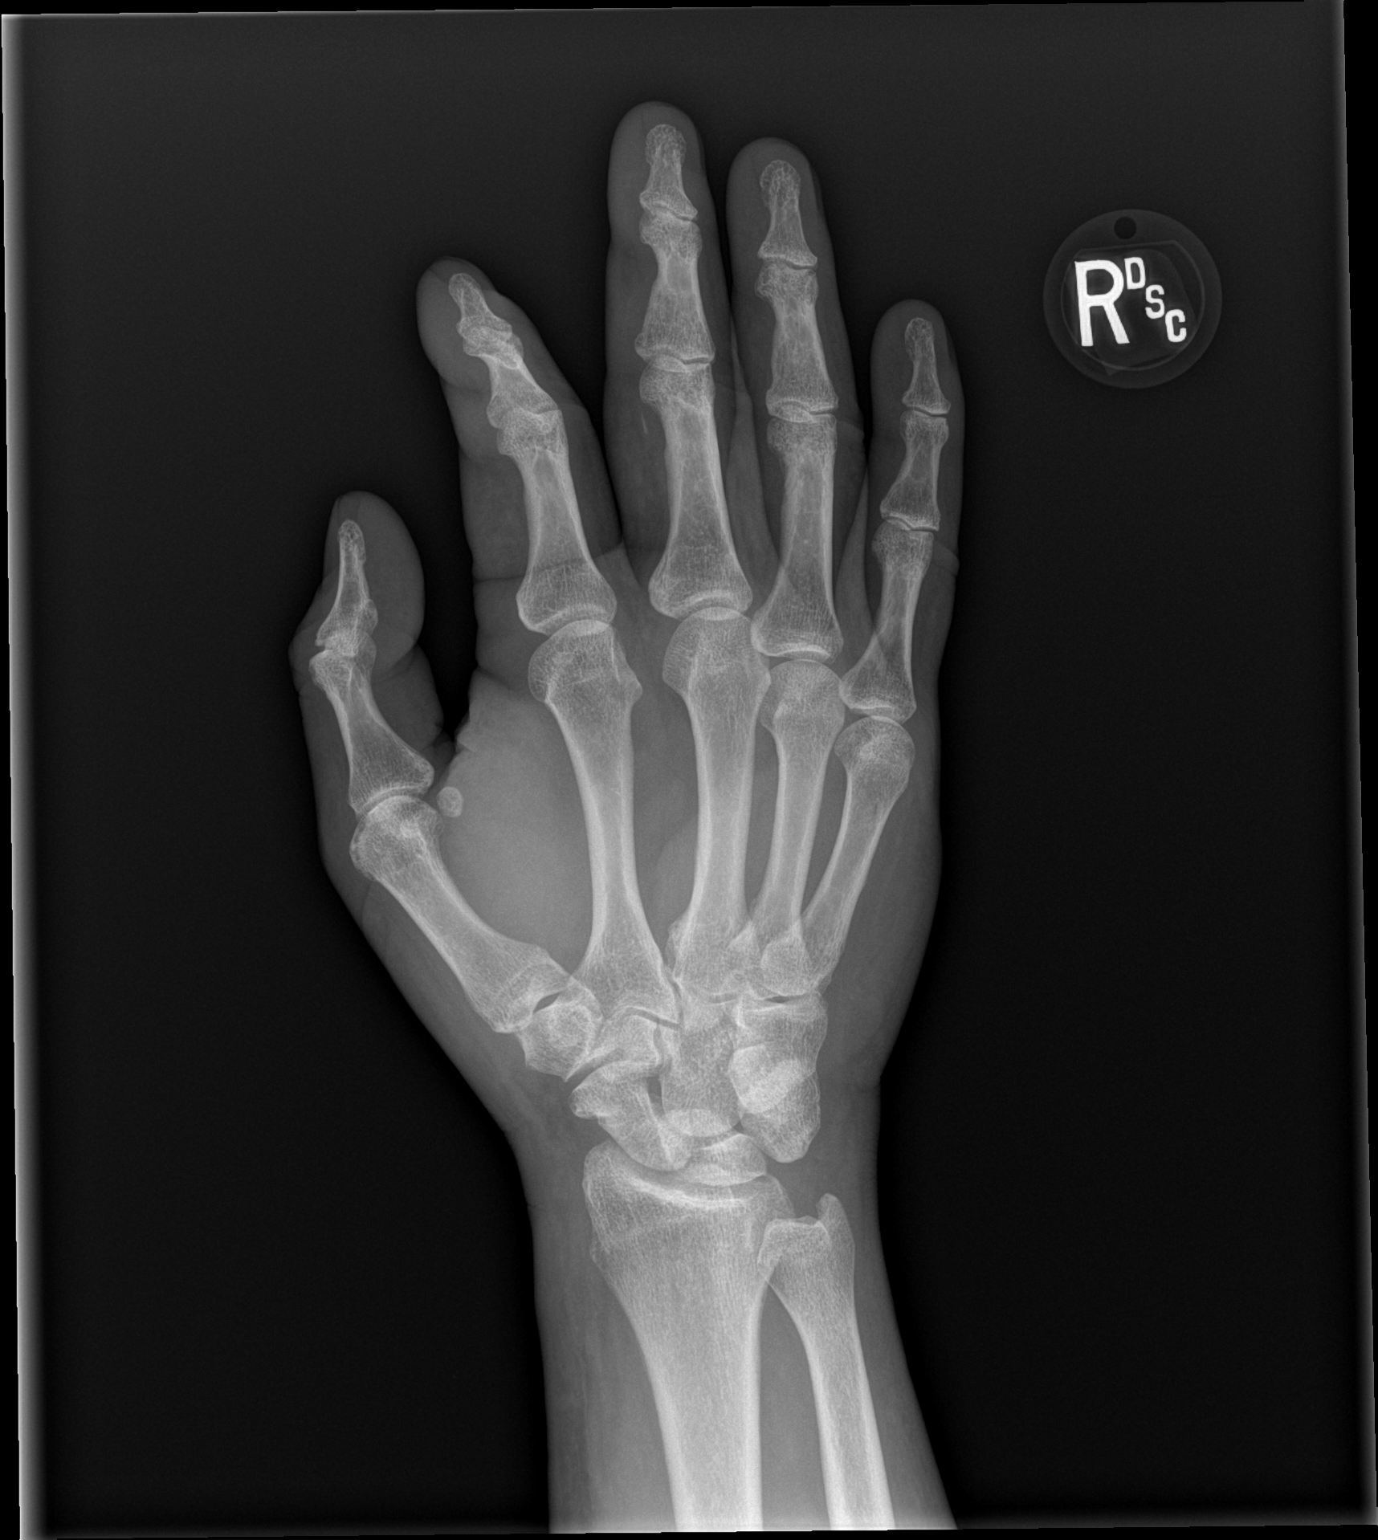

[x hand lat right]
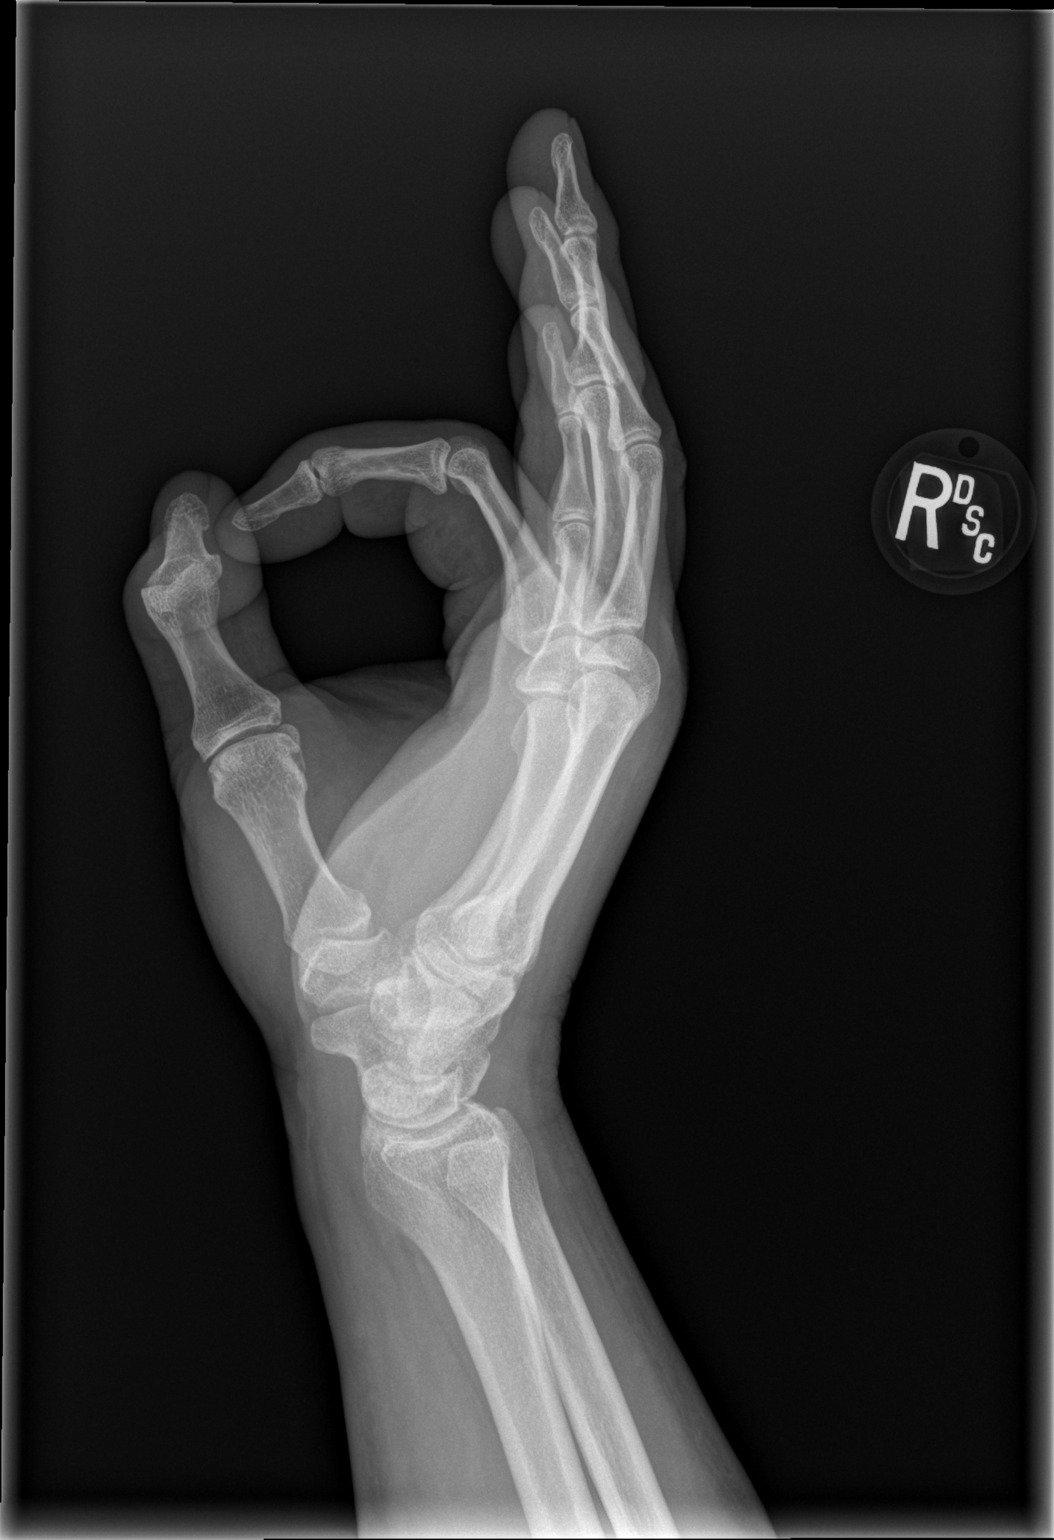

[3 of 3 positions shown; findings below may reference images not displayed]

FINDINGS: There is no acute fracture or dislocation. No significant
arthropathy. Mild soft tissue edema.
IMPRESSION: 1. No acute bone abnormality.
2. Soft tissue edema.

## 2023-01-14 ENCOUNTER — Emergency Department (HOSPITAL_COMMUNITY): Payer: BLUE CROSS/BLUE SHIELD

## 2023-01-14 ENCOUNTER — Encounter (HOSPITAL_COMMUNITY): Payer: Self-pay

## 2023-01-14 ENCOUNTER — Other Ambulatory Visit: Payer: Self-pay

## 2023-01-14 ENCOUNTER — Inpatient Hospital Stay (HOSPITAL_COMMUNITY)
Admission: EM | Admit: 2023-01-14 | Discharge: 2023-01-28 | DRG: 896 | Disposition: A | Discharge status: Home / Self Care | Payer: BLUE CROSS/BLUE SHIELD | Attending: Family Medicine | Admitting: Family Medicine

## 2023-01-14 DIAGNOSIS — F05 Delirium due to known physiological condition: Secondary | ICD-10-CM | POA: Diagnosis present

## 2023-01-14 DIAGNOSIS — Z9911 Dependence on respirator [ventilator] status: Secondary | ICD-10-CM | POA: Diagnosis not present

## 2023-01-14 DIAGNOSIS — R197 Diarrhea, unspecified: Secondary | ICD-10-CM | POA: Diagnosis present

## 2023-01-14 DIAGNOSIS — J9611 Chronic respiratory failure with hypoxia: Secondary | ICD-10-CM | POA: Diagnosis not present

## 2023-01-14 DIAGNOSIS — B379 Candidiasis, unspecified: Secondary | ICD-10-CM | POA: Diagnosis not present

## 2023-01-14 DIAGNOSIS — J690 Pneumonitis due to inhalation of food and vomit: Secondary | ICD-10-CM | POA: Diagnosis not present

## 2023-01-14 DIAGNOSIS — R7401 Elevation of levels of liver transaminase levels: Secondary | ICD-10-CM | POA: Diagnosis not present

## 2023-01-14 DIAGNOSIS — Z9889 Other specified postprocedural states: Secondary | ICD-10-CM

## 2023-01-14 DIAGNOSIS — Z9152 Personal history of nonsuicidal self-harm: Secondary | ICD-10-CM

## 2023-01-14 DIAGNOSIS — R339 Retention of urine, unspecified: Secondary | ICD-10-CM | POA: Diagnosis not present

## 2023-01-14 DIAGNOSIS — Z1611 Resistance to penicillins: Secondary | ICD-10-CM | POA: Diagnosis not present

## 2023-01-14 DIAGNOSIS — R410 Disorientation, unspecified: Secondary | ICD-10-CM | POA: Diagnosis not present

## 2023-01-14 DIAGNOSIS — F10930 Alcohol use, unspecified with withdrawal, uncomplicated: Secondary | ICD-10-CM | POA: Diagnosis not present

## 2023-01-14 DIAGNOSIS — F10231 Alcohol dependence with withdrawal delirium: Principal | ICD-10-CM | POA: Diagnosis present

## 2023-01-14 DIAGNOSIS — F419 Anxiety disorder, unspecified: Secondary | ICD-10-CM | POA: Diagnosis present

## 2023-01-14 DIAGNOSIS — I10 Essential (primary) hypertension: Secondary | ICD-10-CM | POA: Diagnosis present

## 2023-01-14 DIAGNOSIS — F10931 Alcohol use, unspecified with withdrawal delirium: Secondary | ICD-10-CM | POA: Diagnosis not present

## 2023-01-14 DIAGNOSIS — R571 Hypovolemic shock: Secondary | ICD-10-CM | POA: Diagnosis not present

## 2023-01-14 DIAGNOSIS — Z1152 Encounter for screening for COVID-19: Secondary | ICD-10-CM | POA: Diagnosis not present

## 2023-01-14 DIAGNOSIS — N200 Calculus of kidney: Secondary | ICD-10-CM | POA: Diagnosis present

## 2023-01-14 DIAGNOSIS — F32A Depression, unspecified: Secondary | ICD-10-CM | POA: Diagnosis present

## 2023-01-14 DIAGNOSIS — J9601 Acute respiratory failure with hypoxia: Secondary | ICD-10-CM | POA: Diagnosis not present

## 2023-01-14 DIAGNOSIS — G40909 Epilepsy, unspecified, not intractable, without status epilepticus: Secondary | ICD-10-CM | POA: Diagnosis present

## 2023-01-14 DIAGNOSIS — I9589 Other hypotension: Secondary | ICD-10-CM | POA: Diagnosis not present

## 2023-01-14 DIAGNOSIS — Z781 Physical restraint status: Secondary | ICD-10-CM

## 2023-01-14 DIAGNOSIS — A419 Sepsis, unspecified organism: Secondary | ICD-10-CM | POA: Diagnosis not present

## 2023-01-14 DIAGNOSIS — E876 Hypokalemia: Secondary | ICD-10-CM | POA: Diagnosis not present

## 2023-01-14 DIAGNOSIS — Y908 Blood alcohol level of 240 mg/100 ml or more: Secondary | ICD-10-CM | POA: Diagnosis present

## 2023-01-14 DIAGNOSIS — I952 Hypotension due to drugs: Secondary | ICD-10-CM | POA: Diagnosis not present

## 2023-01-14 DIAGNOSIS — R569 Unspecified convulsions: Secondary | ICD-10-CM | POA: Diagnosis not present

## 2023-01-14 DIAGNOSIS — R739 Hyperglycemia, unspecified: Secondary | ICD-10-CM | POA: Diagnosis present

## 2023-01-14 DIAGNOSIS — G9341 Metabolic encephalopathy: Secondary | ICD-10-CM | POA: Diagnosis not present

## 2023-01-14 DIAGNOSIS — Z602 Problems related to living alone: Secondary | ICD-10-CM | POA: Diagnosis present

## 2023-01-14 DIAGNOSIS — E669 Obesity, unspecified: Secondary | ICD-10-CM | POA: Diagnosis present

## 2023-01-14 DIAGNOSIS — R4182 Altered mental status, unspecified: Secondary | ICD-10-CM | POA: Diagnosis not present

## 2023-01-14 DIAGNOSIS — Z884 Allergy status to anesthetic agent status: Secondary | ICD-10-CM

## 2023-01-14 DIAGNOSIS — J9811 Atelectasis: Secondary | ICD-10-CM | POA: Diagnosis not present

## 2023-01-14 DIAGNOSIS — R6521 Severe sepsis with septic shock: Secondary | ICD-10-CM | POA: Diagnosis not present

## 2023-01-14 DIAGNOSIS — J150 Pneumonia due to Klebsiella pneumoniae: Secondary | ICD-10-CM | POA: Diagnosis not present

## 2023-01-14 DIAGNOSIS — R502 Drug induced fever: Secondary | ICD-10-CM | POA: Diagnosis not present

## 2023-01-14 DIAGNOSIS — Z6832 Body mass index (BMI) 32.0-32.9, adult: Secondary | ICD-10-CM

## 2023-01-14 DIAGNOSIS — F1010 Alcohol abuse, uncomplicated: Secondary | ICD-10-CM | POA: Diagnosis present

## 2023-01-14 DIAGNOSIS — D6959 Other secondary thrombocytopenia: Secondary | ICD-10-CM | POA: Diagnosis present

## 2023-01-14 DIAGNOSIS — Z79899 Other long term (current) drug therapy: Secondary | ICD-10-CM

## 2023-01-14 DIAGNOSIS — D696 Thrombocytopenia, unspecified: Secondary | ICD-10-CM | POA: Diagnosis present

## 2023-01-14 DIAGNOSIS — F10939 Alcohol use, unspecified with withdrawal, unspecified: Secondary | ICD-10-CM | POA: Diagnosis present

## 2023-01-14 DIAGNOSIS — Z9181 History of falling: Secondary | ICD-10-CM

## 2023-01-14 DIAGNOSIS — Z87891 Personal history of nicotine dependence: Secondary | ICD-10-CM

## 2023-01-14 LAB — CBC WITH DIFFERENTIAL/PLATELET
Abs Immature Granulocytes: 0.06 10*3/uL (ref 0.00–0.07)
Basophils Absolute: 0.1 10*3/uL (ref 0.0–0.1)
Basophils Relative: 1 %
Eosinophils Absolute: 0 10*3/uL (ref 0.0–0.5)
Eosinophils Relative: 0 %
HCT: 52.8 % — ABNORMAL HIGH (ref 39.0–52.0)
Hemoglobin: 18.3 g/dL — ABNORMAL HIGH (ref 13.0–17.0)
Immature Granulocytes: 1 %
Lymphocytes Relative: 15 %
Lymphs Abs: 1.5 10*3/uL (ref 0.7–4.0)
MCH: 31.6 pg (ref 26.0–34.0)
MCHC: 34.7 g/dL (ref 30.0–36.0)
MCV: 91 fL (ref 80.0–100.0)
Monocytes Absolute: 0.6 10*3/uL (ref 0.1–1.0)
Monocytes Relative: 7 %
Neutro Abs: 7.5 10*3/uL (ref 1.7–7.7)
Neutrophils Relative %: 76 %
Platelets: 133 10*3/uL — ABNORMAL LOW (ref 150–400)
RBC: 5.8 MIL/uL (ref 4.22–5.81)
RDW: 13.5 % (ref 11.5–15.5)
WBC: 9.7 10*3/uL (ref 4.0–10.5)
nRBC: 0 % (ref 0.0–0.2)

## 2023-01-14 LAB — HIV ANTIBODY (ROUTINE TESTING W REFLEX): HIV Screen 4th Generation wRfx: NONREACTIVE

## 2023-01-14 LAB — COMPREHENSIVE METABOLIC PANEL
ALT: 38 U/L (ref 0–44)
AST: 51 U/L — ABNORMAL HIGH (ref 15–41)
Albumin: 4.3 g/dL (ref 3.5–5.0)
Alkaline Phosphatase: 56 U/L (ref 38–126)
Anion gap: 15 (ref 5–15)
BUN: 22 mg/dL — ABNORMAL HIGH (ref 6–20)
CO2: 25 mmol/L (ref 22–32)
Calcium: 8.1 mg/dL — ABNORMAL LOW (ref 8.9–10.3)
Chloride: 100 mmol/L (ref 98–111)
Creatinine, Ser: 1.11 mg/dL (ref 0.61–1.24)
GFR, Estimated: >60 mL/min (ref >60)
Glucose, Bld: 103 mg/dL — ABNORMAL HIGH (ref 70–99)
Potassium: 3.7 mmol/L (ref 3.5–5.1)
Sodium: 140 mmol/L (ref 135–145)
Total Bilirubin: 0.9 mg/dL (ref 0.3–1.2)
Total Protein: 7.3 g/dL (ref 6.5–8.1)

## 2023-01-14 LAB — LIPASE, BLOOD: Lipase: 57 U/L — ABNORMAL HIGH (ref 11–51)

## 2023-01-14 LAB — ETHANOL: Alcohol, Ethyl (B): 342 mg/dL (ref <10)

## 2023-01-14 LAB — TROPONIN I (HIGH SENSITIVITY)
Troponin I (High Sensitivity): 15 ng/L (ref <18)
Troponin I (High Sensitivity): 17 ng/L (ref <18)

## 2023-01-14 MED ORDER — THIAMINE HCL 100 MG/ML IJ SOLN
100.0000 mg | Freq: Every day | INTRAMUSCULAR | Status: DC
  Administered 2023-01-16 – 2023-01-19 (×4): 100 mg via INTRAVENOUS
  Filled 2023-01-14 (×5): qty 2

## 2023-01-14 MED ORDER — ONDANSETRON HCL 4 MG/2ML IJ SOLN
4.0000 mg | Freq: Once | INTRAMUSCULAR | Status: AC
  Administered 2023-01-14: 4 mg via INTRAVENOUS
  Filled 2023-01-14: qty 2

## 2023-01-14 MED ORDER — LACTATED RINGERS IV BOLUS
1000.0000 mL | Freq: Once | INTRAVENOUS | Status: AC
  Administered 2023-01-14: 1000 mL via INTRAVENOUS

## 2023-01-14 MED ORDER — LORAZEPAM 1 MG PO TABS: 1.0000 mg | ORAL_TABLET | ORAL | Status: DC | PRN

## 2023-01-14 MED ORDER — LORAZEPAM 2 MG/ML IJ SOLN
1.0000 mg | INTRAMUSCULAR | Status: DC | PRN
  Administered 2023-01-14 (×2): 2 mg via INTRAVENOUS
  Administered 2023-01-15: 3 mg via INTRAVENOUS
  Administered 2023-01-15 (×3): 4 mg via INTRAVENOUS
  Administered 2023-01-15: 2 mg via INTRAVENOUS
  Administered 2023-01-15: 3 mg via INTRAVENOUS
  Administered 2023-01-15 (×4): 4 mg via INTRAVENOUS
  Administered 2023-01-15: 2 mg via INTRAVENOUS
  Administered 2023-01-16 (×3): 4 mg via INTRAVENOUS
  Administered 2023-01-16: 2 mg via INTRAVENOUS
  Filled 2023-01-14 (×2): qty 1
  Filled 2023-01-14 (×2): qty 2
  Filled 2023-01-14: qty 1
  Filled 2023-01-14: qty 2
  Filled 2023-01-14: qty 1
  Filled 2023-01-14 (×5): qty 2
  Filled 2023-01-14: qty 1
  Filled 2023-01-14 (×5): qty 2

## 2023-01-14 MED ORDER — ALBUTEROL SULFATE (2.5 MG/3ML) 0.083% IN NEBU
2.5000 mg | INHALATION_SOLUTION | RESPIRATORY_TRACT | Status: DC | PRN
  Administered 2023-01-15 (×2): 2.5 mg via RESPIRATORY_TRACT
  Filled 2023-01-14 (×2): qty 3

## 2023-01-14 MED ORDER — IBUPROFEN 200 MG PO TABS
400.0000 mg | ORAL_TABLET | Freq: Four times a day (QID) | ORAL | Status: DC | PRN
  Administered 2023-01-15: 400 mg via ORAL
  Filled 2023-01-14: qty 2

## 2023-01-14 MED ORDER — SODIUM CHLORIDE (PF) 0.9 % IJ SOLN
INTRAMUSCULAR | Status: AC
  Filled 2023-01-14: qty 50

## 2023-01-14 MED ORDER — THIAMINE MONONITRATE 100 MG PO TABS
100.0000 mg | ORAL_TABLET | Freq: Every day | ORAL | Status: DC
  Administered 2023-01-14 – 2023-01-15 (×2): 100 mg via ORAL
  Filled 2023-01-14 (×2): qty 1

## 2023-01-14 MED ORDER — ADULT MULTIVITAMIN W/MINERALS CH
1.0000 | ORAL_TABLET | Freq: Every day | ORAL | Status: DC
  Administered 2023-01-14 – 2023-01-15 (×2): 1 via ORAL
  Filled 2023-01-14 (×2): qty 1

## 2023-01-14 MED ORDER — LORAZEPAM 2 MG/ML IJ SOLN
0.0000 mg | Freq: Four times a day (QID) | INTRAMUSCULAR | Status: AC
  Administered 2023-01-14: 4 mg via INTRAVENOUS
  Administered 2023-01-14 (×3): 2 mg via INTRAVENOUS
  Administered 2023-01-15: 4 mg via INTRAVENOUS
  Administered 2023-01-15 (×3): 2 mg via INTRAVENOUS
  Filled 2023-01-14 (×2): qty 2
  Filled 2023-01-14 (×5): qty 1

## 2023-01-14 MED ORDER — IOHEXOL 300 MG/ML  SOLN
100.0000 mL | Freq: Once | INTRAMUSCULAR | Status: AC | PRN
  Administered 2023-01-14: 100 mL via INTRAVENOUS

## 2023-01-14 MED ORDER — SODIUM CHLORIDE 0.9 % IV SOLN
25.0000 mg | Freq: Once | INTRAVENOUS | Status: AC
  Administered 2023-01-14: 25 mg via INTRAVENOUS
  Filled 2023-01-14: qty 25

## 2023-01-14 MED ORDER — LORAZEPAM 2 MG/ML IJ SOLN
0.0000 mg | Freq: Two times a day (BID) | INTRAMUSCULAR | Status: DC
  Filled 2023-01-14: qty 2

## 2023-01-14 MED ORDER — ENOXAPARIN SODIUM 40 MG/0.4ML IJ SOSY
40.0000 mg | PREFILLED_SYRINGE | INTRAMUSCULAR | Status: DC
  Administered 2023-01-14 – 2023-01-17 (×4): 40 mg via SUBCUTANEOUS
  Filled 2023-01-14 (×4): qty 0.4

## 2023-01-14 MED ORDER — ONDANSETRON HCL 4 MG/2ML IJ SOLN
4.0000 mg | Freq: Four times a day (QID) | INTRAMUSCULAR | Status: DC | PRN
  Administered 2023-01-15 (×2): 4 mg via INTRAVENOUS
  Filled 2023-01-14 (×2): qty 2

## 2023-01-14 MED ORDER — FOLIC ACID 1 MG PO TABS
1.0000 mg | ORAL_TABLET | Freq: Every day | ORAL | Status: DC
  Administered 2023-01-14 – 2023-01-15 (×2): 1 mg via ORAL
  Filled 2023-01-14 (×2): qty 1

## 2023-01-14 MED ORDER — ONDANSETRON HCL 4 MG PO TABS: 4.0000 mg | ORAL_TABLET | Freq: Four times a day (QID) | ORAL | Status: DC | PRN

## 2023-01-14 MED ORDER — SODIUM CHLORIDE 0.9 % IV SOLN: INTRAVENOUS | Status: DC

## 2023-01-14 MED ORDER — TRAZODONE HCL 50 MG PO TABS
25.0000 mg | ORAL_TABLET | Freq: Every evening | ORAL | Status: DC | PRN
  Administered 2023-01-14: 25 mg via ORAL
  Filled 2023-01-14: qty 1

## 2023-01-14 NOTE — ED Notes (Signed)
ED TO INPATIENT HANDOFF REPORT  Name/Age/Gender Jermaine [name] 57 y.o. male  Code Status    Code Status Orders  (From admission, onward)           Start     Ordered   01/14/23 0752  Full code  Continuous       Question:  By:  Answer:  Consent: discussion documented in EHR   01/14/23 0752           Code Status History     Date Active Date Inactive Code Status Order ID Comments User Context   05/12/2022 2228 05/22/2022 1647 Full Code [id number]  Jacques Navy, MD ED   05/12/2022 1741 05/12/2022 2228 Full Code [id number]  Su Monks, PA-C ED   04/22/2022 2241 04/26/2022 1844 Full Code [id number]  Therisa Doyne, MD ED   09/14/2021 1224 09/17/2021 1813 Full Code [id number]  Bobette Mo, MD ED   07/01/2021 2003 07/05/2021 1623 Full Code [id number]  Hillary Bow, DO ED   03/18/2019 0927 03/22/2019 1546 Full Code [id number]  Alwyn Ren, MD ED   02/05/2019 1104 02/06/2019 1513 Full Code [id number]  Briscoe Deutscher, MD ED   11/19/2018 1331 11/23/2018 1741 Full Code [id number]  Hughie Closs, MD Inpatient   11/16/2018 1329 11/18/2018 1846 Full Code [id number]  Burnadette Pop, MD ED   11/16/2018 0803 11/16/2018 1329 Full Code [id number]  Eulah Pont Barbaraann Boys ED       Home/SNF/Other Home  Chief Complaint Alcohol withdrawal [facility]) [F10.939]  Level of Care/Admitting Diagnosis ED Disposition     ED Disposition  Admit   Condition  --   Comment  Hospital Area: [facility] [[id number]]  Level of Care: Progressive [102]  Admit to Progressive based on following criteria: MULTISYSTEM THREATS such as stable sepsis, metabolic/electrolyte imbalance with or without encephalopathy that is responding to early treatment.  May admit patient to [name] or [facility] if equivalent level of care is available:: Yes  Covid Evaluation: Asymptomatic - no recent exposure (last 10 days) testing not required  Diagnosis: Alcohol withdrawal (HCC)  [291.81.ICD-9-CM]  Admitting Physician: Maryln Gottron [[id number]]  Attending Physician: Kirby Crigler, MIR [email or web address] [[id number]]  Certification:: I certify this patient will need inpatient services for at least 2 midnights  Expected Medical Readiness: 01/17/2023          Medical History Past Medical History:  Diagnosis Date   Alcohol abuse    Anxiety    Class 1 obesity 09/14/2021   Depression     Allergies No Known Allergies  IV Location/Drains/Wounds Patient Lines/Drains/Airways Status     Active Line/Drains/Airways     Name Placement date Placement time Site Days   Peripheral IV 01/14/23 20 G 1" Left Antecubital 01/14/23  0405  Antecubital  less than 1            Labs/Imaging Results for orders placed or performed during the hospital encounter of 01/14/23 (from the past 48 hour(s))  Comprehensive metabolic panel     Status: Abnormal   Collection Time: 01/14/23  4:17 AM  Result Value Ref Range   Sodium 140 135 - 145 mmol/L   Potassium 3.7 3.5 - 5.1 mmol/L   Chloride 100 98 - 111 mmol/L   CO2 25 22 - 32 mmol/L   Glucose, Bld 103 (H) 70 - 99 mg/dL    Comment: Glucose reference range applies only to samples taken after fasting for at least 8 hours.   BUN 22 (H)  6 - 20 mg/dL   Creatinine, Ser 5.40 0.61 - 1.24 mg/dL   Calcium 8.1 (L) 8.9 - 10.3 mg/dL   Total Protein 7.3 6.5 - 8.1 g/dL   Albumin 4.3 3.5 - 5.0 g/dL   AST 51 (H) 15 - 41 U/L   ALT 38 0 - 44 U/L   Alkaline Phosphatase 56 38 - 126 U/L   Total Bilirubin 0.9 0.3 - 1.2 mg/dL   GFR, Estimated >98 >11 mL/min    Comment: (NOTE) Calculated using the CKD-EPI Creatinine Equation (2021)    Anion gap 15 5 - 15    Comment: Performed at [facility], [address], [place], [place] [address]  Ethanol     Status: Abnormal   Collection Time: 01/14/23  4:17 AM  Result Value Ref Range   Alcohol, Ethyl (B) 342 (HH) <10 mg/dL    Comment: CRITICAL RESULT CALLED TO, READ BACK BY AND VERIFIED  WITH JOHNSON,J. RN AT 2956 01/14/23 MULLINS,T (NOTE) Lowest detectable limit for serum alcohol is 10 mg/dL.  For medical purposes only. Performed at [facility], [address], [place], [place] [address]   CBC with Differential     Status: Abnormal   Collection Time: 01/14/23  4:17 AM  Result Value Ref Range   WBC 9.7 4.0 - 10.5 K/uL   RBC 5.80 4.22 - 5.81 MIL/uL   Hemoglobin 18.3 (H) 13.0 - 17.0 g/dL   HCT 65.7 (H) 84.6 - 96.2 %   MCV 91.0 80.0 - 100.0 fL   MCH 31.6 26.0 - 34.0 pg   MCHC 34.7 30.0 - 36.0 g/dL   RDW 95.2 84.1 - 32.4 %   Platelets 133 (L) 150 - 400 K/uL   nRBC 0.0 0.0 - 0.2 %   Neutrophils Relative % 76 %   Neutro Abs 7.5 1.7 - 7.7 K/uL   Lymphocytes Relative 15 %   Lymphs Abs 1.5 0.7 - 4.0 K/uL   Monocytes Relative 7 %   Monocytes Absolute 0.6 0.1 - 1.0 K/uL   Eosinophils Relative 0 %   Eosinophils Absolute 0.0 0.0 - 0.5 K/uL   Basophils Relative 1 %   Basophils Absolute 0.1 0.0 - 0.1 K/uL   Immature Granulocytes 1 %   Abs Immature Granulocytes 0.06 0.00 - 0.07 K/uL    Comment: Performed at [facility], [address] [place], [place] [address]  Lipase, blood     Status: Abnormal   Collection Time: 01/14/23  4:17 AM  Result Value Ref Range   Lipase 57 (H) 11 - 51 U/L    Comment: Performed at [facility], [address] [place], [place] [address]  Troponin I (High Sensitivity)     Status: None   Collection Time: 01/14/23  4:17 AM  Result Value Ref Range   Troponin I (High Sensitivity) 15 <18 ng/L    Comment: (NOTE) Elevated high sensitivity troponin I (hsTnI) values and significant  changes across serial measurements may suggest ACS but many other  chronic and acute conditions are known to elevate hsTnI results.  Refer to the "Links" section for chest pain algorithms and additional  guidance. Performed at [facility], [address], [place], [place] [address]    Troponin I (High Sensitivity)     Status: None   Collection Time: 01/14/23  6:00 AM  Result Value Ref Range   Troponin I (High Sensitivity) 17 <18 ng/L    Comment: (NOTE) Elevated high  sensitivity troponin I (hsTnI) values and significant  changes across serial measurements may suggest ACS but many other  chronic and acute conditions are known to elevate hsTnI results.  Refer to the "Links" section for chest pain algorithms and additional  guidance. Performed at [facility], [address] [place], [place] [address]    CT ABDOMEN PELVIS W CONTRAST  Result Date: 01/14/2023 CLINICAL DATA:  Abdominal pain, acute, nonlocalized EXAM: CT ABDOMEN AND PELVIS WITH CONTRAST TECHNIQUE: Multidetector CT imaging of the abdomen and pelvis was performed using the standard protocol following bolus administration of intravenous contrast. RADIATION DOSE REDUCTION: This exam was performed according to the departmental dose-optimization program which includes automated exposure control, adjustment of the mA and/or kV according to patient size and/or use of iterative reconstruction technique. CONTRAST:  OMNIPAQUE IOHEXOL 300 MG/ML  SOLN COMPARISON:  CT AP 04/22/22 FINDINGS: Lower chest: Right basilar atelectasis. Hepatobiliary: Liver has a normal contour. Portal and hepatic veins are contrast opacify. Interval increase in size of the hypodense lesion in the right hepatic lobe (series 2, image 27) compared to 04/22/2022. no evidence of perihepatic fluid. Gallbladder is distended with a small amount of layering hyperdense debris (series 2, image 35). There is no evidence of of pericholecystic fat stranding or definite evidence of wall thickening suggest acute cholecystitis. No evidence of intra or extrahepatic biliary ductal dilatation. Pancreas: There is no evidence of peripancreatic fat stranding to suggest pancreatitis. No evidence of pancreatic ductal dilatation. No definite evidence of  focal pancreatic lesion. Spleen: Spleen is normal in size. No focal splenic lesions are visualized. Adrenals/Urinary Tract: Bilateral adrenal glands are normal in appearance. Bilateral kidneys are normal in size without evidence of hydronephrosis. There is likely a small 2 mm renal stone of the upper pole of the right kidney. No focal renal lesions are visualized. The urinary bladder is fluid-filled without focal abnormalities. Stomach/Bowel: No evidence of bowel obstruction. There is diverticulosis without diverticulitis. The appendix is normal in appearance. No evidence of focal wall thickening to suggest end-organ ischemia. No pneumatosis. No portal venous gas. Vascular/Lymphatic: There is an apparent filling defect in the proximal aspect of SMA (series 2, image 42). This is incompletely assessed due to the degree of respiratory motion artifact. If there is clinical concern for acute mesenteric ischemia, with a CTA is recommended. No evidence of retroperitoneal, pelvic, or inguinal lymphadenopathy. Reproductive: Prostate is unremarkable. Other: No abdominal wall hernia or abnormality. No abdominopelvic ascites. Musculoskeletal: No acute or significant osseous findings. IMPRESSION: 1. Apparent filling defect in the proximal aspect of the SMA. This is incompletely assessed due to the degree of respiratory motion artifact and is likely artifactual. No evidence of end-organ ischemia. If there is clinical concern for acute mesenteric ischemia, further evaluation with a CTA abdomen/pelvis is recommended. 2. Interval increase in size of the hypodense lesion in the right hepatic lobe compared to 04/22/2022. Recommend further evaluation with a nonemergent outpatient liver protocol MRI. 3. Distended gallbladder with a small amount of layering hyperdense debris. No evidence of pericholecystic fat stranding or definite evidence of wall thickening to suggest acute cholecystitis. Electronically Signed   By: Lorenza Cambridge M.D.    On: 01/14/2023 08:17    Pending Labs Unresulted Labs (From admission, onward)     Start     Ordered   01/15/23 0500  Comprehensive metabolic panel  Tomorrow morning,   R        01/14/23 0752   01/15/23 0500  CBC  Tomorrow  morning,   R        01/14/23 0752   01/14/23 0752  HIV Antibody (routine testing w rflx)  (HIV Antibody (Routine testing w reflex) panel)  Once,   R        01/14/23 0752   01/14/23 0324  Rapid urine drug screen (hospital performed)  Once,   STAT        01/14/23 0323            Vitals/Pain Today's Vitals   01/14/23 0615 01/14/23 0645 01/14/23 0715 01/14/23 0807  BP: (!) 142/94 (!) 149/97 (!) 147/105   Pulse: (!) 107 (!) 106 (!) 110   Resp: 20 (!) 21 (!) 23   Temp:    98.1 F (36.7 C)  TempSrc:    Oral  SpO2: 94% 95% 95%   PainSc:        Isolation Precautions No active isolations  Medications Medications  LORazepam (ATIVAN) tablet 1-4 mg (has no administration in time range)    Or  LORazepam (ATIVAN) injection 1-4 mg (has no administration in time range)  thiamine (VITAMIN B1) tablet 100 mg (has no administration in time range)    Or  thiamine (VITAMIN B1) injection 100 mg (has no administration in time range)  folic acid (FOLVITE) tablet 1 mg (has no administration in time range)  multivitamin with minerals tablet 1 tablet (has no administration in time range)  LORazepam (ATIVAN) injection 0-4 mg (4 mg Intravenous Given 01/14/23 0408)    Followed by  LORazepam (ATIVAN) injection 0-4 mg (has no administration in time range)  enoxaparin (LOVENOX) injection 40 mg (has no administration in time range)  0.9 %  sodium chloride infusion (has no administration in time range)  ibuprofen (ADVIL) tablet 400 mg (has no administration in time range)  traZODone (DESYREL) tablet 25 mg (has no administration in time range)  ondansetron (ZOFRAN) tablet 4 mg (has no administration in time range)    Or  ondansetron (ZOFRAN) injection 4 mg (has no administration  in time range)  albuterol (PROVENTIL) (2.5 MG/3ML) 0.083% nebulizer solution 2.5 mg (has no administration in time range)  lactated ringers bolus 1,000 mL (1,000 mLs Intravenous Bolus 01/14/23 0407)  ondansetron (ZOFRAN) injection 4 mg (4 mg Intravenous Given 01/14/23 0409)  promethazine (PHENERGAN) 25 mg in sodium chloride 0.9 % 50 mL IVPB (0 mg Intravenous Stopped 01/14/23 0513)  iohexol (OMNIPAQUE) 300 MG/ML solution 100 mL (100 mLs Intravenous Contrast Given 01/14/23 0741)    Mobility walks with person assist

## 2023-01-14 NOTE — H&P (Signed)
History and Physical  Jermaine [name] SWF:[id number] DOB: [birth year] DOA: 01/14/2023  PCP: Farris Has, MD   Chief Complaint: Nausea   HPI: Jermaine [name] is a 57 y.o. male with medical history significant for anxiety and depression, as well as alcohol abuse being admitted to the hospital with alcohol withdrawal.  The patient has a history of trying to quit drinking, has gone several months at a time without drinking.  States that for the last 2 to 3 months unfortunately has been on a binge, drinking 4 Loco and his last drink was yesterday 8/19.  Says that he started to get nauseous, was vomiting, and is " sick of it" and wants to stop drinking.  Denies any hematemesis, any diarrhea, has very minimal diffuse abdominal pain.  Denies any fevers or chills.  Apparently on initial arrival to the emergency department, he was tremulous, shaky, tachycardic and diaphoretic.  He received 4 mg Ativan and is now somewhat somnolent, calm.  Labs are significant for platelets 133, AST 51, lipase 57.  Hospitalist was contacted for admission, he just had CT of the abdomen and pelvis the interpretation of which is pending.  Review of Systems: Please see HPI for pertinent positives and negatives. A complete 10 system review of systems are otherwise negative.  Past Medical History:  Diagnosis Date   Alcohol abuse    Anxiety    Class 1 obesity 09/14/2021   Depression    Past Surgical History:  Procedure Laterality Date   HERNIA REPAIR      Social History:  reports that he has never smoked. He has never used smokeless tobacco. He reports current alcohol use of about 20.0 standard drinks of alcohol per week. He reports that he does not currently use drugs.   No Known Allergies  Family History  Adopted: Yes     Prior to Admission medications   Medication Sig Start Date End Date Taking? Authorizing Provider  escitalopram (LEXAPRO) 20 MG tablet Take 20 mg by mouth daily. 02/27/19   [provider]   folic acid (FOLVITE) 1 MG tablet Take 1 tablet (1 mg total) by mouth daily. 05/20/22   [name], MD  hydrOXYzine (ATARAX) 25 MG tablet Take 1 tablet (25 mg total) by mouth 3 (three) times daily as needed for itching or anxiety. 05/20/22   [name], MD  LORazepam (ATIVAN) 1 MG tablet Take 1 tablet (1 mg total) by mouth every 6 (six) hours as needed for anxiety. 05/22/22   [name], MD  Multiple Vitamin (MULTIVITAMIN WITH MINERALS) TABS tablet Take 1 tablet by mouth daily. 05/20/22   [name], MD  thiamine (VITAMIN B-1) 100 MG tablet Take 1 tablet (100 mg total) by mouth daily. 05/20/22   [name], MD    Physical Exam: BP (!) 147/105   Pulse (!) 110   Temp 98.6 F (37 C) (Oral)   Resp (!) 23   SpO2 95%   General:  Alert, oriented, calm, in no acute distress, somewhat sleepy, disheveled in appearance Eyes: EOMI, clear conjuctivae, white sclerea Neck: supple, no masses, trachea mildline  Cardiovascular: RRR, no murmurs or rubs, no peripheral edema  Respiratory: clear to auscultation bilaterally, no wheezes, no crackles  Abdomen: soft, nontender, distended, normal bowel tones heard  Skin: dry, no rashes  Musculoskeletal: no joint effusions, normal range of motion  Psychiatric: appropriate affect, normal speech  Neurologic: extraocular muscles intact, clear speech, moving all extremities with intact sensorium  Labs on Admission:  Basic Metabolic Panel: Recent Labs  Lab 01/14/23 0417  NA 140  K 3.7  CL 100  CO2 25  GLUCOSE 103*  BUN 22*  CREATININE 1.11  CALCIUM 8.1*   Liver Function Tests: Recent Labs  Lab 01/14/23 0417  AST 51*  ALT 38  ALKPHOS 56  BILITOT 0.9  PROT 7.3  ALBUMIN 4.3   Recent Labs  Lab 01/14/23 0417  LIPASE 57*   No results for input(s): "AMMONIA" in the last 168 hours. CBC: Recent Labs  Lab 01/14/23 0417  WBC 9.7  NEUTROABS 7.5  HGB 18.3*  HCT 52.8*  MCV 91.0  PLT 133*   Cardiac Enzymes: No results  for input(s): "CKTOTAL", "CKMB", "CKMBINDEX", "TROPONINI" in the last 168 hours.  BNP (last 3 results) No results for input(s): "BNP" in the last 8760 hours.  ProBNP (last 3 results) No results for input(s): "PROBNP" in the last 8760 hours.  CBG: No results for input(s): "GLUCAP" in the last 168 hours.  Radiological Exams on Admission: No results found.  Assessment/Plan 57 year old male with a history of anxiety/depression, alcohol abuse being admitted to the hospital requesting detox.  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 Inpatient status is judged to be reasonable and necessary in order to provide the required intensity of service to ensure the patient's safety. The patient's presenting symptoms, physical exam findings, and initial radiographic and laboratory data in the context of their chronic comorbidities is felt to place them at high risk for further clinical deterioration. Furthermore, it is not anticipated that the patient will be medically stable for discharge from the hospital within 2 midnights of admission.    I certify that at the point of admission it is my clinical judgment that the patient will require inpatient hospital care spanning beyond 2 midnights from the point of admission due to high intensity of service, high risk for further deterioration and high frequency of surveillance required  Time spent: 48  minutes  Iyauna Sing Sharlette Dense MD [facility] Pager [phone]  If 7PM-7AM, please contact night-coverage [email or web address] Password [facility]  01/14/2023, 7:52 AM

## 2023-01-14 NOTE — ED Notes (Signed)
Pt appears to be sleeping, VSS, in NAD.

## 2023-01-14 NOTE — ED Triage Notes (Signed)
Pt. Arrives for alcohol withdrawal. Pt experiencing NVD, and irritability. Last drink was an hour prior to calling EMS. BP 152/74 HR 100 O2 99 CBG 143

## 2023-01-14 NOTE — ED Provider Notes (Signed)
[place] EMERGENCY DEPARTMENT AT [facility] Provider Note   CSN: [id number] Arrival date & time: 01/14/23  0310     History  Chief Complaint  Patient presents with   Alcohol Problem    Jermaine [name] is a 57 y.o. male.  Level 5 caveat for dry heaving, actively vomiting.  Patient concern for alcohol withdrawal.  States he is a chronic alcoholic and has gone through detox many times in the past.  He normally drinks 4 Loco about 5 or 6 a day but has been trying to cut back.  Last drink was about 5 or 6 hours ago.  States he has had intractable nausea and vomiting for the past 5 days with diarrhea.  He is tremulous, shaky, tachycardic, diaphoretic.  Complains of pain all over involving his chest and his abdomen.  Dry heaving on arrival.  States he feels sore all over.  States he has been trying to cut back for the past 4 to 5 days but has had significant amount of nausea and vomiting and diarrhea.  Denies any suicidal thoughts or homicidal thoughts.  Denies hearing any voices.  The history is provided by the patient and the EMS personnel.  Alcohol Problem Associated symptoms include abdominal pain. Pertinent negatives include no chest pain, no headaches and no shortness of breath.       Home Medications Prior to Admission medications   Medication Sig Start Date End Date Taking? Authorizing Provider  escitalopram (LEXAPRO) 20 MG tablet Take 20 mg by mouth daily. 02/27/19   [provider]  folic acid (FOLVITE) 1 MG tablet Take 1 tablet (1 mg total) by mouth daily. 05/20/22   [name], MD  hydrOXYzine (ATARAX) 25 MG tablet Take 1 tablet (25 mg total) by mouth 3 (three) times daily as needed for itching or anxiety. 05/20/22   [name], MD  LORazepam (ATIVAN) 1 MG tablet Take 1 tablet (1 mg total) by mouth every 6 (six) hours as needed for anxiety. 05/22/22   [name], MD  Multiple Vitamin (MULTIVITAMIN WITH MINERALS) TABS tablet Take 1 tablet by mouth  daily. 05/20/22   [name], MD  thiamine (VITAMIN B-1) 100 MG tablet Take 1 tablet (100 mg total) by mouth daily. 05/20/22   [name], MD      Allergies    Patient has no known allergies.    Review of Systems   Review of Systems  Constitutional:  Positive for activity change and appetite change. Negative for fever.  HENT:  Negative for congestion and rhinorrhea.   Respiratory:  Negative for cough, chest tightness and shortness of breath.   Cardiovascular:  Negative for chest pain.  Gastrointestinal:  Positive for abdominal pain, diarrhea, nausea and vomiting.  Genitourinary:  Negative for dysuria and hematuria.  Musculoskeletal:  Negative for arthralgias and myalgias.  Skin:  Negative for rash.  Neurological:  Negative for dizziness, weakness and headaches.   all other systems are negative except as noted in the HPI and PMH.    Physical Exam Updated Vital Signs BP (!) 142/116   Pulse (!) 124   Temp 98.6 F (37 C) (Oral)   Resp 20   SpO2 96%  Physical Exam Vitals and nursing note reviewed.  Constitutional:      General: He is in acute distress.     Appearance: He is well-developed. He is ill-appearing and diaphoretic.     Comments: Actively vomiting, tremulous, diaphoretic, tachycardia  HENT:     Head: Normocephalic  and atraumatic.     Mouth/Throat:     Pharynx: No oropharyngeal exudate.  Eyes:     Conjunctiva/sclera: Conjunctivae normal.     Pupils: Pupils are equal, round, and reactive to light.  Neck:     Comments: No meningismus. Cardiovascular:     Rate and Rhythm: Regular rhythm. Tachycardia present.     Heart sounds: Normal heart sounds. No murmur heard. Pulmonary:     Effort: Pulmonary effort is normal. No respiratory distress.     Breath sounds: Normal breath sounds.  Abdominal:     Palpations: Abdomen is soft.     Tenderness: There is abdominal tenderness. There is no guarding or rebound.     Comments: Diffuse tenderness, voluntary guarding  throughout  Musculoskeletal:        General: No tenderness. Normal range of motion.     Cervical back: Normal range of motion and neck supple.  Skin:    General: Skin is warm.  Neurological:     Mental Status: He is alert and oriented to person, place, and time.     Cranial Nerves: No cranial nerve deficit.     Motor: No abnormal muscle tone.     Coordination: Coordination normal.     Comments: No ataxia on finger to nose bilaterally. No pronator drift. 5/5 strength throughout. CN 2-12 intact.Equal grip strength. Sensation intact.   Tremors of arms and legs against gravity  Psychiatric:        Behavior: Behavior normal.     ED Results / Procedures / Treatments   Labs (all labs ordered are listed, but only abnormal results are displayed) Labs Reviewed  COMPREHENSIVE METABOLIC PANEL - Abnormal; Notable for the following components:      Result Value   Glucose, Bld 103 (*)    BUN 22 (*)    Calcium 8.1 (*)    AST 51 (*)    All other components within normal limits  ETHANOL - Abnormal; Notable for the following components:   Alcohol, Ethyl (B) 342 (*)    All other components within normal limits  CBC WITH DIFFERENTIAL/PLATELET - Abnormal; Notable for the following components:   Hemoglobin 18.3 (*)    HCT 52.8 (*)    Platelets 133 (*)    All other components within normal limits  LIPASE, BLOOD - Abnormal; Notable for the following components:   Lipase 57 (*)    All other components within normal limits  RAPID URINE DRUG SCREEN, HOSP PERFORMED  HIV ANTIBODY (ROUTINE TESTING W REFLEX)  TROPONIN I (HIGH SENSITIVITY)  TROPONIN I (HIGH SENSITIVITY)    EKG EKG Interpretation Date/Time:  Monday January 14 2023 03:51:50 EDT Ventricular Rate:  104 PR Interval:  131 QRS Duration:  98 QT Interval:  337 QTC Calculation: 444 R Axis:   95  Text Interpretation: Sinus tachycardia Borderline right axis deviation No significant change was found Confirmed by Glynn Octave [phone]) on  01/14/2023 3:53:25 AM  Radiology CT ABDOMEN PELVIS W CONTRAST  Result Date: 01/14/2023 CLINICAL DATA:  Abdominal pain, acute, nonlocalized EXAM: CT ABDOMEN AND PELVIS WITH CONTRAST TECHNIQUE: Multidetector CT imaging of the abdomen and pelvis was performed using the standard protocol following bolus administration of intravenous contrast. RADIATION DOSE REDUCTION: This exam was performed according to the departmental dose-optimization program which includes automated exposure control, adjustment of the mA and/or kV according to patient size and/or use of iterative reconstruction technique. CONTRAST:  OMNIPAQUE IOHEXOL 300 MG/ML  SOLN COMPARISON:  CT AP 04/22/22 FINDINGS:  Lower chest: Right basilar atelectasis. Hepatobiliary: Liver has a normal contour. Portal and hepatic veins are contrast opacify. Interval increase in size of the hypodense lesion in the right hepatic lobe (series 2, image 27) compared to 04/22/2022. no evidence of perihepatic fluid. Gallbladder is distended with a small amount of layering hyperdense debris (series 2, image 35). There is no evidence of of pericholecystic fat stranding or definite evidence of wall thickening suggest acute cholecystitis. No evidence of intra or extrahepatic biliary ductal dilatation. Pancreas: There is no evidence of peripancreatic fat stranding to suggest pancreatitis. No evidence of pancreatic ductal dilatation. No definite evidence of focal pancreatic lesion. Spleen: Spleen is normal in size. No focal splenic lesions are visualized. Adrenals/Urinary Tract: Bilateral adrenal glands are normal in appearance. Bilateral kidneys are normal in size without evidence of hydronephrosis. There is likely a small 2 mm renal stone of the upper pole of the right kidney. No focal renal lesions are visualized. The urinary bladder is fluid-filled without focal abnormalities. Stomach/Bowel: No evidence of bowel obstruction. There is diverticulosis without diverticulitis.  The appendix is normal in appearance. No evidence of focal wall thickening to suggest end-organ ischemia. No pneumatosis. No portal venous gas. Vascular/Lymphatic: There is an apparent filling defect in the proximal aspect of SMA (series 2, image 42). This is incompletely assessed due to the degree of respiratory motion artifact. If there is clinical concern for acute mesenteric ischemia, with a CTA is recommended. No evidence of retroperitoneal, pelvic, or inguinal lymphadenopathy. Reproductive: Prostate is unremarkable. Other: No abdominal wall hernia or abnormality. No abdominopelvic ascites. Musculoskeletal: No acute or significant osseous findings. IMPRESSION: 1. Apparent filling defect in the proximal aspect of the SMA. This is incompletely assessed due to the degree of respiratory motion artifact and is likely artifactual. No evidence of end-organ ischemia. If there is clinical concern for acute mesenteric ischemia, further evaluation with a CTA abdomen/pelvis is recommended. 2. Interval increase in size of the hypodense lesion in the right hepatic lobe compared to 04/22/2022. Recommend further evaluation with a nonemergent outpatient liver protocol MRI. 3. Distended gallbladder with a small amount of layering hyperdense debris. No evidence of pericholecystic fat stranding or definite evidence of wall thickening to suggest acute cholecystitis. Electronically Signed   By: Lorenza Cambridge M.D.   On: 01/14/2023 08:17    Procedures .Critical Care  Performed by: Glynn Octave, MD Authorized by: Glynn Octave, MD   Critical care provider statement:    Critical care time (minutes):  45   Critical care time was exclusive of:  Separately billable procedures and treating other patients   Critical care was necessary to treat or prevent imminent or life-threatening deterioration of the following conditions: alcohol withdrawal.   Critical care was time spent personally by me on the following activities:   Development of treatment plan with patient or surrogate, discussions with consultants, evaluation of patient's response to treatment, examination of patient, ordering and review of laboratory studies, ordering and review of radiographic studies, ordering and performing treatments and interventions, pulse oximetry, re-evaluation of patient's condition, review of old charts, blood draw for specimens and obtaining history from patient or surrogate   I assumed direction of critical care for this patient from another provider in my specialty: no     Care discussed with: admitting provider       Medications Ordered in ED Medications  LORazepam (ATIVAN) tablet 1-4 mg (has no administration in time range)    Or  LORazepam (ATIVAN) injection 1-4 mg (has  no administration in time range)  thiamine (VITAMIN B1) tablet 100 mg (has no administration in time range)    Or  thiamine (VITAMIN B1) injection 100 mg (has no administration in time range)  folic acid (FOLVITE) tablet 1 mg (has no administration in time range)  multivitamin with minerals tablet 1 tablet (has no administration in time range)  LORazepam (ATIVAN) injection 0-4 mg (has no administration in time range)    Followed by  LORazepam (ATIVAN) injection 0-4 mg (has no administration in time range)  lactated ringers bolus 1,000 mL (has no administration in time range)  ondansetron (ZOFRAN) injection 4 mg (has no administration in time range)  promethazine (PHENERGAN) 25 mg in sodium chloride 0.9 % 50 mL IVPB (has no administration in time range)    ED Course/ Medical Decision Making/ A&P                                 Medical Decision Making Amount and/or Complexity of Data Reviewed Independent Historian: EMS Labs: ordered. Decision-making details documented in ED Course. Radiology: ordered and independent interpretation performed. Decision-making details documented in ED Course. ECG/medicine tests: ordered and independent  interpretation performed. Decision-making details documented in ED Course.  Risk OTC drugs. Prescription drug management. Decision regarding hospitalization.   Nausea vomiting diarrhea, tachycardia, diaphoresis concern for alcohol withdrawal.  Last drink about 5 hours ago.  He is tremulous and tachycardic.  Establish IV access, antiemetics, Ativan per CIWA protocol, labs to be obtained  Lab significant for alcohol level of 342.  Patient tachycardic and tremulous despite this.  LFTs improved from previous.  No leukocytosis.  Hemoconcentrated noted.  Concern for alcohol withdrawal with elevated CIWA score of 31.  Patient given IV fluids, IV Ativan, thiamine and folate per protocol.  CIWA score has improved with IV Ativan.  Patient resting comfortably.  Remains tachycardic with tremors have improved and diaphoresis has improved.  Admission for alcohol withdrawal d/w Dr. Julian Reil.  CT abdomen/pelvis pending.         Final Clinical Impression(s) / ED Diagnoses Final diagnoses:  Alcohol withdrawal syndrome, with delirium [facility])    Rx / DC Orders ED Discharge Orders     None         Abigayl Hor, Jeannett Senior, MD 01/14/23 [phone]

## 2023-01-14 NOTE — TOC Initial Note (Signed)
Transition of Care [facility]) - Initial/Assessment Note    Patient Details  Name: Jermaine [name] MRN: [id number] Date of Birth: 11-28-1965  Transition of Care [facility]) CM/SW Contact:    Howell Rucks, RN Phone Number: 01/14/2023, 1:50 PM  Clinical Narrative: Met with pt at bedside to introduce role of TOC/NCM and review for dc planning, pt confirmed he has a PCP and pharmacy in place, pt reports he lives alone, has support from friends, reports no home care services or home DME, pt unsure if he will have transportation at discharge. TOC will continue to follow for transportation needs. Pt declined SA resources, reports he has received them before.                     Expected Discharge Plan: Home/Self Care Barriers to Discharge: Continued Medical Work up   Patient Goals and CMS Choice Patient states their goals for this hospitalization and ongoing recovery are:: return home with support from friends          Expected Discharge Plan and Services       Living arrangements for the past 2 months: Single Family Home                                      Prior Living Arrangements/Services Living arrangements for the past 2 months: Single Family Home Lives with:: Self Patient language and need for interpreter reviewed:: Yes Do you feel safe going back to the place where you live?: Yes      Need for Family Participation in Patient Care: Yes (Comment) Care giver support system in place?: Yes (comment)   Criminal Activity/Legal Involvement Pertinent to Current Situation/Hospitalization: No - Comment as needed  Activities of Daily Living Home Assistive Devices/Equipment: None ADL Screening (condition at time of admission) Patient's cognitive ability adequate to safely complete daily activities?: No Is the patient deaf or have difficulty hearing?: No Does the patient have difficulty seeing, even when wearing glasses/contacts?: No Does the patient have difficulty concentrating,  remembering, or making decisions?: No Patient able to express need for assistance with ADLs?: No Does the patient have difficulty dressing or bathing?: No Independently performs ADLs?: Yes (appropriate for developmental age) Does the patient have difficulty walking or climbing stairs?: No Weakness of Legs: None Weakness of Arms/Hands: None  Permission Sought/Granted Permission sought to share information with : Case Manager Permission granted to share information with : Yes, Verbal Permission Granted  Share Information with NAME: Fannie Knee, RN           Emotional Assessment Appearance:: Appears stated age Attitude/Demeanor/Rapport: Gracious Affect (typically observed): Accepting Orientation: : Oriented to Self, Oriented to Place, Oriented to  Time, Oriented to Situation Alcohol / Substance Use: Alcohol Use Psych Involvement: No (comment)  Admission diagnosis:  Alcohol withdrawal (HCC) [F10.939] Alcohol withdrawal syndrome, with delirium (HCC) [F10.931] Patient Active Problem List   Diagnosis Date Noted   Suicide ideation 04/22/2022   Alcohol withdrawal delirium (HCC) 04/22/2022   Diverticulitis 04/22/2022   Dehydration 04/22/2022   Class 1 obesity 09/14/2021   Secondary polycythemia 09/14/2021   Sinus tachycardia 07/01/2021   Anxiety and depression 11/20/2018   Alcohol withdrawal (HCC) 11/19/2018   Hepatic steatosis 11/19/2018   Alcohol abuse with intoxication (HCC) 11/16/2018   Chronic alcohol abuse 11/16/2018   Elevated liver enzymes 11/16/2018   Thrombocytopenia (HCC) 11/16/2018   PCP:  Farris Has, MD Pharmacy:  [id number] [place], [place] - [address] [place] [address] Phone: [phone] Fax: [phone]     Social Determinants of Health (SDOH) Social History: SDOH Screenings   Food Insecurity: No Food Insecurity (01/14/2023)  Housing: Medium Risk (01/14/2023)  Transportation Needs: No Transportation  Needs (01/14/2023)  Utilities: Not At Risk (01/14/2023)  Tobacco Use: Low Risk  (01/14/2023)   SDOH Interventions:     Readmission Risk Interventions    01/14/2023    1:49 PM  Readmission Risk Prevention Plan  Post Dischage Appt Complete  Medication Screening Complete  Transportation Screening Complete

## 2023-01-15 ENCOUNTER — Inpatient Hospital Stay (HOSPITAL_COMMUNITY): Payer: BLUE CROSS/BLUE SHIELD

## 2023-01-15 DIAGNOSIS — F10931 Alcohol use, unspecified with withdrawal delirium: Secondary | ICD-10-CM

## 2023-01-15 LAB — CBC
HCT: 43.8 % (ref 39.0–52.0)
Hemoglobin: 15 g/dL (ref 13.0–17.0)
MCH: 32.5 pg (ref 26.0–34.0)
MCHC: 34.2 g/dL (ref 30.0–36.0)
MCV: 94.8 fL (ref 80.0–100.0)
Platelets: 111 10*3/uL — ABNORMAL LOW (ref 150–400)
RBC: 4.62 MIL/uL (ref 4.22–5.81)
RDW: 13.7 % (ref 11.5–15.5)
WBC: 5.9 10*3/uL (ref 4.0–10.5)
nRBC: 0 % (ref 0.0–0.2)

## 2023-01-15 LAB — COMPREHENSIVE METABOLIC PANEL
ALT: 27 U/L (ref 0–44)
AST: 39 U/L (ref 15–41)
Albumin: 3.4 g/dL — ABNORMAL LOW (ref 3.5–5.0)
Alkaline Phosphatase: 65 U/L (ref 38–126)
Anion gap: 7 (ref 5–15)
BUN: 22 mg/dL — ABNORMAL HIGH (ref 6–20)
CO2: 29 mmol/L (ref 22–32)
Calcium: 7.9 mg/dL — ABNORMAL LOW (ref 8.9–10.3)
Chloride: 99 mmol/L (ref 98–111)
Creatinine, Ser: 1.01 mg/dL (ref 0.61–1.24)
GFR, Estimated: >60 mL/min (ref >60)
Glucose, Bld: 99 mg/dL (ref 70–99)
Potassium: 3.5 mmol/L (ref 3.5–5.1)
Sodium: 135 mmol/L (ref 135–145)
Total Bilirubin: 1.5 mg/dL — ABNORMAL HIGH (ref 0.3–1.2)
Total Protein: 6 g/dL — ABNORMAL LOW (ref 6.5–8.1)

## 2023-01-15 LAB — RAPID URINE DRUG SCREEN, HOSP PERFORMED
Amphetamines: NOT DETECTED
Barbiturates: NOT DETECTED
Benzodiazepines: POSITIVE — AB
Cocaine: NOT DETECTED
Opiates: NOT DETECTED
Tetrahydrocannabinol: NOT DETECTED

## 2023-01-15 LAB — SARS CORONAVIRUS 2 BY RT PCR: SARS Coronavirus 2 by RT PCR: NEGATIVE

## 2023-01-15 LAB — MRSA NEXT GEN BY PCR, NASAL: MRSA by PCR Next Gen: NOT DETECTED

## 2023-01-15 MED ORDER — SODIUM CHLORIDE 0.9 % IV SOLN: 0.4000 ug/kg/h | INTRAVENOUS | Status: DC

## 2023-01-15 MED ORDER — IPRATROPIUM-ALBUTEROL 0.5-2.5 (3) MG/3ML IN SOLN
3.0000 mL | RESPIRATORY_TRACT | Status: AC
  Administered 2023-01-15 – 2023-01-16 (×4): 3 mL via RESPIRATORY_TRACT
  Filled 2023-01-15 (×4): qty 3

## 2023-01-15 MED ORDER — IPRATROPIUM-ALBUTEROL 0.5-2.5 (3) MG/3ML IN SOLN
3.0000 mL | RESPIRATORY_TRACT | Status: AC | PRN
  Administered 2023-01-15 – 2023-01-16 (×3): 3 mL via RESPIRATORY_TRACT
  Filled 2023-01-15 (×3): qty 3

## 2023-01-15 MED ORDER — LIDOCAINE HCL URETHRAL/MUCOSAL 2 % EX GEL
1.0000 | Freq: Once | CUTANEOUS | Status: DC
  Filled 2023-01-15: qty 5

## 2023-01-15 MED ORDER — CHLORHEXIDINE GLUCONATE CLOTH 2 % EX PADS
6.0000 | MEDICATED_PAD | Freq: Every day | CUTANEOUS | Status: DC
  Administered 2023-01-15 – 2023-01-26 (×11): 6 via TOPICAL

## 2023-01-15 MED ORDER — DEXMEDETOMIDINE HCL IN NACL 400 MCG/100ML IV SOLN
0.0000 ug/kg/h | INTRAVENOUS | Status: DC
  Filled 2023-01-15: qty 100

## 2023-01-15 MED ORDER — ORAL CARE MOUTH RINSE: 15.0000 mL | OROMUCOSAL | Status: DC | PRN

## 2023-01-15 MED ORDER — OLANZAPINE 10 MG IM SOLR: 2.5000 mg | Freq: Once | INTRAMUSCULAR | Status: DC | PRN

## 2023-01-15 MED ORDER — DEXMEDETOMIDINE HCL IN NACL 200 MCG/50ML IV SOLN: 0.0000 ug/kg/h | INTRAVENOUS | Status: DC

## 2023-01-15 MED ORDER — DEXMEDETOMIDINE HCL IN NACL 200 MCG/50ML IV SOLN
0.2000 ug/kg/h | INTRAVENOUS | Status: DC
  Filled 2023-01-15: qty 50

## 2023-01-15 MED ORDER — DEXMEDETOMIDINE HCL IN NACL 400 MCG/100ML IV SOLN
0.0000 ug/kg/h | INTRAVENOUS | Status: DC
  Administered 2023-01-15: 0.2 ug/kg/h via INTRAVENOUS
  Administered 2023-01-15: 1.2 ug/kg/h via INTRAVENOUS
  Administered 2023-01-15: 0.5 ug/kg/h via INTRAVENOUS
  Administered 2023-01-16 (×2): 1.2 ug/kg/h via INTRAVENOUS
  Filled 2023-01-15 (×4): qty 100

## 2023-01-15 MED ORDER — OLANZAPINE 10 MG IM SOLR
2.5000 mg | Freq: Once | INTRAMUSCULAR | Status: AC | PRN
  Administered 2023-01-16: 2.5 mg via INTRAMUSCULAR
  Filled 2023-01-15: qty 10

## 2023-01-15 NOTE — Progress Notes (Signed)
Pt placed on 3L nasal cannula due to desaturation during sleep. Will assess when patient awake if O2 is needed.

## 2023-01-15 NOTE — Progress Notes (Signed)
Patient became acutely agitated after scanning his bladder, patient retaining 715 mL per bladder scanner. While RN was alone in the room, patient became aggressive, continually attempting to get out of the bed. Attempted to verbally deescalate patient without success. Attempted other comfort measures including repositioning and reorientation. Patient is acutely agitated and combative, hallucinating, and unable to be reoriented. Patient began attempting to grab and kick at this RN. Called for other staff to help. Staff arrived and patient restrained for staff and patient safety. Patient minimally responsive to ativan, CIWA elevated. Johann Capers, NP made aware. Given order for restraints. Patient's precedex also increased.    2100: Patient able to be redirected to urinate, which decreased some agitation. Patient urinated appropriate amount according to bladder scan.   2220: Patient audibly inspiratory and expiratory wheezing. RT called and patient assessed. Spoke with NP, scheduled nebs ordered.   0010: Patient is still agitated, fighting and pulling against restraints, still not redirectable. PRN ativan administered. NP made aware of minimal symptom relief.   0120: Patient again became acutely agitated, attempted to bite through EKG cords, and when unsuccessful, attempted to rip them apart. Patient medicated with PRN medications. Was able to successfully start 2nd PIV. Patient repositioned, cords reattached, and breathing tx given for wheezing.

## 2023-01-15 NOTE — Progress Notes (Signed)
PROGRESS NOTE    Simmon Rieken  HYQ:[id number] DOB: 1965/07/23 DOA: 01/14/2023 PCP: Farris Has, MD    Brief Narrative:  57 year old with history of anxiety and depression, alcohol abuse who was binge drinking at least for the last few months decided to quit as he got sick of it.  Last drink 8/19.  In the emergency room tremulous, shaky, tachycardic and diaphoretic.  Responded to Ativan.  Admitted with alcohol withdrawal syndrome. 8/20, with florid alcohol withdrawal syndrome and delirium tremens.  Transferring to ICU to start on Precedex infusion along with Ativan.   Assessment & Plan:   Alcohol withdrawal syndrome in a patient with alcohol abuse: Delirium tremens. On benzodiazepine, Ativan 4 mg every hour and despite this patient is delirious and hallucinating. Transferred to ICU.  start Precedex infusion titrate up.  Continue high-dose benzodiazepine. Continue multivitamins thiamine folic acid Seizure precautions.  Fall precautions.  Ongoing counseling. Apply soft restraints to prevent from fall and protecting IV lines as needed. Close monitoring of electrolytes, liver function test, liver renal function test. Maintenance IV fluids  Anxiety and depression: Home Lexapro.  Continue.   DVT prophylaxis: enoxaparin (LOVENOX) injection 40 mg Start: 01/14/23 1000 SCDs Start: 01/14/23 9528   Code Status: Full code Family Communication: None. Disposition Plan: Status is: Inpatient Remains inpatient appropriate because: Severe alcohol withdrawal     Consultants:  None  Procedures:  None  Antimicrobials:  None   Subjective: Patient seen multiple times today.  Tremulous.  Seeing cats and dogs in his room.  He just wants this hallucination to go away.  Restless.  Transferred to ICU.  Objective: Vitals:   01/15/23 0810 01/15/23 1033 01/15/23 1100 01/15/23 1116  BP: 106/79 (!) 136/112 (!) 164/98   Pulse: 89 (!) 101 (!) 108   Resp:  (!) 26 (!) 30   Temp:  (!) 101.3 F  (38.5 C)  99.6 F (37.6 C)  TempSrc:  Oral  Oral  SpO2:  97% 97%   Weight:  93.5 kg    Height:  5\' 10"  (1.778 m)      Intake/Output Summary (Last 24 hours) at 01/15/2023 1158 Last data filed at 01/15/2023 1142 Gross per 24 hour  Intake 2287.39 ml  Output 400 ml  Net 1887.39 ml   Filed Weights   01/14/23 1315 01/15/23 1033  Weight: 92.1 kg 93.5 kg    Examination:  General exam: Appears anxious, tremulous and shaky. Respiratory system: No added sounds. Cardiovascular system: S1 & S2 heard, RRR.  Tachycardic.   Gastrointestinal system: Soft.  Nontender bowel sound present.   Central nervous system: Alert and oriented.  Moves all extremities.  No focal deficits. Alert awake and oriented.  Anxious.  Tremulous.  Denies any delusions.  Has been having visual hallucinations.    Data Reviewed: I have personally reviewed following labs and imaging studies  CBC: Recent Labs  Lab 01/14/23 0417 01/15/23 0401  WBC 9.7 5.9  NEUTROABS 7.5  --   HGB 18.3* 15.0  HCT 52.8* 43.8  MCV 91.0 94.8  PLT 133* 111*   Basic Metabolic Panel: Recent Labs  Lab 01/14/23 0417 01/15/23 0401  NA 140 135  K 3.7 3.5  CL 100 99  CO2 25 29  GLUCOSE 103* 99  BUN 22* 22*  CREATININE 1.11 1.01  CALCIUM 8.1* 7.9*   GFR: Estimated Creatinine Clearance: 92.7 mL/min (by C-G formula based on SCr of 1.01 mg/dL). Liver Function Tests: Recent Labs  Lab 01/14/23 0417 01/15/23 0401  AST 51*  39  ALT 38 27  ALKPHOS 56 65  BILITOT 0.9 1.5*  PROT 7.3 6.0*  ALBUMIN 4.3 3.4*   Recent Labs  Lab 01/14/23 0417  LIPASE 57*   No results for input(s): "AMMONIA" in the last 168 hours. Coagulation Profile: No results for input(s): "INR", "PROTIME" in the last 168 hours. Cardiac Enzymes: No results for input(s): "CKTOTAL", "CKMB", "CKMBINDEX", "TROPONINI" in the last 168 hours. BNP (last 3 results) No results for input(s): "PROBNP" in the last 8760 hours. HbA1C: No results for input(s): "HGBA1C" in  the last 72 hours. CBG: No results for input(s): "GLUCAP" in the last 168 hours. Lipid Profile: No results for input(s): "CHOL", "HDL", "LDLCALC", "TRIG", "CHOLHDL", "LDLDIRECT" in the last 72 hours. Thyroid Function Tests: No results for input(s): "TSH", "T4TOTAL", "FREET4", "T3FREE", "THYROIDAB" in the last 72 hours. Anemia Panel: No results for input(s): "VITAMINB12", "FOLATE", "FERRITIN", "TIBC", "IRON", "RETICCTPCT" in the last 72 hours. Sepsis Labs: No results for input(s): "PROCALCITON", "LATICACIDVEN" in the last 168 hours.  No results found for this or any previous visit (from the past 240 hour(s)).       Radiology Studies: CT ABDOMEN PELVIS W CONTRAST  Result Date: 01/14/2023 CLINICAL DATA:  Abdominal pain, acute, nonlocalized EXAM: CT ABDOMEN AND PELVIS WITH CONTRAST TECHNIQUE: Multidetector CT imaging of the abdomen and pelvis was performed using the standard protocol following bolus administration of intravenous contrast. RADIATION DOSE REDUCTION: This exam was performed according to the departmental dose-optimization program which includes automated exposure control, adjustment of the mA and/or kV according to patient size and/or use of iterative reconstruction technique. CONTRAST:  OMNIPAQUE IOHEXOL 300 MG/ML  SOLN COMPARISON:  CT AP 04/22/22 FINDINGS: Lower chest: Right basilar atelectasis. Hepatobiliary: Liver has a normal contour. Portal and hepatic veins are contrast opacify. Interval increase in size of the hypodense lesion in the right hepatic lobe (series 2, image 27) compared to 04/22/2022. no evidence of perihepatic fluid. Gallbladder is distended with a small amount of layering hyperdense debris (series 2, image 35). There is no evidence of of pericholecystic fat stranding or definite evidence of wall thickening suggest acute cholecystitis. No evidence of intra or extrahepatic biliary ductal dilatation. Pancreas: There is no evidence of peripancreatic fat stranding  to suggest pancreatitis. No evidence of pancreatic ductal dilatation. No definite evidence of focal pancreatic lesion. Spleen: Spleen is normal in size. No focal splenic lesions are visualized. Adrenals/Urinary Tract: Bilateral adrenal glands are normal in appearance. Bilateral kidneys are normal in size without evidence of hydronephrosis. There is likely a small 2 mm renal stone of the upper pole of the right kidney. No focal renal lesions are visualized. The urinary bladder is fluid-filled without focal abnormalities. Stomach/Bowel: No evidence of bowel obstruction. There is diverticulosis without diverticulitis. The appendix is normal in appearance. No evidence of focal wall thickening to suggest end-organ ischemia. No pneumatosis. No portal venous gas. Vascular/Lymphatic: There is an apparent filling defect in the proximal aspect of SMA (series 2, image 42). This is incompletely assessed due to the degree of respiratory motion artifact. If there is clinical concern for acute mesenteric ischemia, with a CTA is recommended. No evidence of retroperitoneal, pelvic, or inguinal lymphadenopathy. Reproductive: Prostate is unremarkable. Other: No abdominal wall hernia or abnormality. No abdominopelvic ascites. Musculoskeletal: No acute or significant osseous findings. IMPRESSION: 1. Apparent filling defect in the proximal aspect of the SMA. This is incompletely assessed due to the degree of respiratory motion artifact and is likely artifactual. No evidence of end-organ ischemia.  If there is clinical concern for acute mesenteric ischemia, further evaluation with a CTA abdomen/pelvis is recommended. 2. Interval increase in size of the hypodense lesion in the right hepatic lobe compared to 04/22/2022. Recommend further evaluation with a nonemergent outpatient liver protocol MRI. 3. Distended gallbladder with a small amount of layering hyperdense debris. No evidence of pericholecystic fat stranding or definite evidence of  wall thickening to suggest acute cholecystitis. Electronically Signed   By: Lorenza Cambridge M.D.   On: 01/14/2023 08:17        Scheduled Meds:  Chlorhexidine Gluconate Cloth  6 each Topical Daily   enoxaparin (LOVENOX) injection  40 mg Subcutaneous Q24H   folic acid  1 mg Oral Daily   LORazepam  0-4 mg Intravenous Q6H   Followed by   Melene Muller ON 01/16/2023] LORazepam  0-4 mg Intravenous Q12H   multivitamin with minerals  1 tablet Oral Daily   thiamine  100 mg Oral Daily   Or   thiamine  100 mg Intravenous Daily   Continuous Infusions:  sodium chloride 100 mL/hr at 01/15/23 1142   dexmedetomidine (PRECEDEX) IV infusion 0.4 mcg/kg/hr (01/15/23 1142)     LOS: 1 day    Time spent: 50 minutes    Dorcas Carrow, MD [facility]

## 2023-01-15 NOTE — TOC Progression Note (Signed)
Transition of Care [facility]) - Progression Note    Patient Details  Name: Jaramiah Jud MRN: [id number] Date of Birth: 1965-11-29  Transition of Care [facility]) CM/SW Contact  Howell Rucks, RN Phone Number: 01/15/2023, 10:38 AM  Clinical Narrative:  [facility] consult received form SA resources/counseling. Initial assessment completed on 01/14/23, pt declined SA resources. TOC will continue to follow.      Expected Discharge Plan: Home/Self Care Barriers to Discharge: Continued Medical Work up  Expected Discharge Plan and Services       Living arrangements for the past 2 months: Single Family Home                                       Social Determinants of Health (SDOH) Interventions SDOH Screenings   Food Insecurity: No Food Insecurity (01/14/2023)  Housing: Medium Risk (01/14/2023)  Transportation Needs: No Transportation Needs (01/14/2023)  Utilities: Not At Risk (01/14/2023)  Tobacco Use: Low Risk  (01/14/2023)    Readmission Risk Interventions    01/14/2023    1:49 PM  Readmission Risk Prevention Plan  Post Dischage Appt Complete  Medication Screening Complete  Transportation Screening Complete

## 2023-01-15 NOTE — Plan of Care (Signed)
  Problem: Safety: Goal: Ability to remain free from injury will improve Outcome: Progressing   Problem: Education: Goal: Knowledge of General Education information will improve Description: Including pain rating scale, medication(s)/side effects and non-pharmacologic comfort measures Outcome: Not Progressing   Problem: Health Behavior/Discharge Planning: Goal: Ability to manage health-related needs will improve Outcome: Not Progressing   Problem: Clinical Measurements: Goal: Ability to maintain clinical measurements within normal limits will improve Outcome: Not Progressing

## 2023-01-15 NOTE — Progress Notes (Signed)
       Overnight   NAME: Pj Bartschi MRN: [id number] DOB : 12/26/1965    Date of Service   01/15/2023   HPI/Events of Note    Notified by RN for aggression and combativeness in spite of Precedex and periodic Ativan.   Signs and Symptoms of DTs/withdrawal. Patient is having intermittent hallucinations and combativeness requiring restraints with Medications.  57 year old with history of anxiety depression, alcohol abuse who has been binge drinking at least for the past few months decided to quit. Last drink was 8/19. Patient was admitted through the ER where he was shaky(tremulous), tachycardic, diaphoretic Admitted with alcohol withdrawal syndrome DTs. Transferred to ICU and started on Precedex fusion with intermittent Ativan    Interventions/ Plan   Precedex increased  Restraints ordered- Remove when able per protocol  Foley due to retention      Chinita Greenland BSN MSNA MSN ACNPC-AG Acute Care Nurse Practitioner [facility]

## 2023-01-15 NOTE — Progress Notes (Signed)
   01/14/23 2300  Assess: MEWS Score  Temp 98.4 F (36.9 C)  BP (!) 141/102  MAP (mmHg) 115  Pulse Rate (!) 111  Resp 20  SpO2 98 %  O2 Device Room Air  Assess: MEWS Score  MEWS Temp 0  MEWS Systolic 0  MEWS Pulse 2  MEWS RR 0  MEWS LOC 0  MEWS Score 2  MEWS Score Color Yellow  Assess: if the MEWS score is Yellow or Red  Were vital signs accurate and taken at a resting state? Yes  Does the patient meet 2 or more of the SIRS criteria? No  MEWS guidelines implemented  No, previously yellow, continue vital signs every 4 hours  Notify: Charge Nurse/RN  Name of Charge Nurse/RN Notified Geryl Rankins RN  Provider Notification  Provider Name/Title Luiz Iron, NP  Date Provider Notified 01/15/23  Method of Notification  (Chat)  Notification Reason Other (Comment) ([place])  Assess: SIRS CRITERIA  SIRS Temperature  0  SIRS Pulse 1  SIRS Respirations  0  SIRS WBC 0  SIRS Score Sum  1   Patient AxOx4, CIWA, agitated, restless, tachycardia, hypertension. Ativan administered.

## 2023-01-16 ENCOUNTER — Inpatient Hospital Stay (HOSPITAL_COMMUNITY): Payer: BLUE CROSS/BLUE SHIELD

## 2023-01-16 DIAGNOSIS — G9341 Metabolic encephalopathy: Secondary | ICD-10-CM | POA: Diagnosis not present

## 2023-01-16 DIAGNOSIS — F10930 Alcohol use, unspecified with withdrawal, uncomplicated: Secondary | ICD-10-CM | POA: Diagnosis not present

## 2023-01-16 LAB — BLOOD GAS, ARTERIAL
Acid-Base Excess: 1.7 mmol/L (ref 0.0–2.0)
Bicarbonate: 25.8 mmol/L (ref 20.0–28.0)
Drawn by: 560031
FIO2: 100 %
MECHVT: 580 mL
O2 Saturation: 99.8 %
PEEP: 5 cmH2O
Patient temperature: 37
RATE: 20 resp/min
pCO2 arterial: 38 mmHg (ref 32–48)
pH, Arterial: 7.44 (ref 7.35–7.45)
pO2, Arterial: 292 mmHg — ABNORMAL HIGH (ref 83–108)

## 2023-01-16 LAB — COMPREHENSIVE METABOLIC PANEL
ALT: 24 U/L (ref 0–44)
AST: 37 U/L (ref 15–41)
Albumin: 3.4 g/dL — ABNORMAL LOW (ref 3.5–5.0)
Alkaline Phosphatase: 45 U/L (ref 38–126)
Anion gap: 10 (ref 5–15)
BUN: 13 mg/dL (ref 6–20)
CO2: 24 mmol/L (ref 22–32)
Calcium: 7.7 mg/dL — ABNORMAL LOW (ref 8.9–10.3)
Chloride: 102 mmol/L (ref 98–111)
Creatinine, Ser: 0.86 mg/dL (ref 0.61–1.24)
GFR, Estimated: >60 mL/min (ref >60)
Glucose, Bld: 129 mg/dL — ABNORMAL HIGH (ref 70–99)
Potassium: 4.1 mmol/L (ref 3.5–5.1)
Sodium: 136 mmol/L (ref 135–145)
Total Bilirubin: 0.8 mg/dL (ref 0.3–1.2)
Total Protein: 6.3 g/dL — ABNORMAL LOW (ref 6.5–8.1)

## 2023-01-16 LAB — CBC WITH DIFFERENTIAL/PLATELET
Abs Immature Granulocytes: 0.07 10*3/uL (ref 0.00–0.07)
Basophils Absolute: 0 10*3/uL (ref 0.0–0.1)
Basophils Relative: 0 %
Eosinophils Absolute: 0 10*3/uL (ref 0.0–0.5)
Eosinophils Relative: 0 %
HCT: 43.2 % (ref 39.0–52.0)
Hemoglobin: 14.6 g/dL (ref 13.0–17.0)
Immature Granulocytes: 1 %
Lymphocytes Relative: 11 %
Lymphs Abs: 1 10*3/uL (ref 0.7–4.0)
MCH: 32.1 pg (ref 26.0–34.0)
MCHC: 33.8 g/dL (ref 30.0–36.0)
MCV: 94.9 fL (ref 80.0–100.0)
Monocytes Absolute: 0.4 10*3/uL (ref 0.1–1.0)
Monocytes Relative: 5 %
Neutro Abs: 7.5 10*3/uL (ref 1.7–7.7)
Neutrophils Relative %: 83 %
Platelets: 88 10*3/uL — ABNORMAL LOW (ref 150–400)
RBC: 4.55 MIL/uL (ref 4.22–5.81)
RDW: 13.3 % (ref 11.5–15.5)
WBC: 9 10*3/uL (ref 4.0–10.5)
nRBC: 0 % (ref 0.0–0.2)

## 2023-01-16 LAB — GLUCOSE, CAPILLARY
Glucose-Capillary: 103 mg/dL — ABNORMAL HIGH (ref 70–99)
Glucose-Capillary: 104 mg/dL — ABNORMAL HIGH (ref 70–99)
Glucose-Capillary: 106 mg/dL — ABNORMAL HIGH (ref 70–99)
Glucose-Capillary: 124 mg/dL — ABNORMAL HIGH (ref 70–99)
Glucose-Capillary: 93 mg/dL (ref 70–99)

## 2023-01-16 LAB — MAGNESIUM: Magnesium: 2 mg/dL (ref 1.7–2.4)

## 2023-01-16 LAB — HEMOGLOBIN A1C
Hgb A1c MFr Bld: 5.4 % (ref 4.8–5.6)
Mean Plasma Glucose: 108.28 mg/dL

## 2023-01-16 LAB — PHOSPHORUS: Phosphorus: 2.4 mg/dL — ABNORMAL LOW (ref 2.5–4.6)

## 2023-01-16 MED ORDER — PHENOBARBITAL SODIUM 130 MG/ML IJ SOLN
97.5000 mg | Freq: Three times a day (TID) | INTRAMUSCULAR | Status: AC
  Administered 2023-01-16 – 2023-01-18 (×6): 97.5 mg via INTRAVENOUS
  Filled 2023-01-16 (×6): qty 1

## 2023-01-16 MED ORDER — ORAL CARE MOUTH RINSE: 15.0000 mL | OROMUCOSAL | Status: DC | PRN

## 2023-01-16 MED ORDER — FENTANYL CITRATE (PF) 100 MCG/2ML IJ SOLN
INTRAMUSCULAR | Status: AC
  Administered 2023-01-16: 100 ug via INTRAVENOUS
  Filled 2023-01-16: qty 2

## 2023-01-16 MED ORDER — FENTANYL CITRATE (PF) 100 MCG/2ML IJ SOLN: 100.0000 ug | Freq: Once | INTRAMUSCULAR | Status: AC

## 2023-01-16 MED ORDER — ETOMIDATE 2 MG/ML IV SOLN: 20.0000 mg | Freq: Once | INTRAVENOUS | Status: AC

## 2023-01-16 MED ORDER — ROCURONIUM BROMIDE 10 MG/ML (PF) SYRINGE
PREFILLED_SYRINGE | INTRAVENOUS | Status: AC
  Administered 2023-01-16: 100 mg via INTRAVENOUS
  Filled 2023-01-16: qty 10

## 2023-01-16 MED ORDER — ACETAMINOPHEN 325 MG PO TABS: 650.0000 mg | ORAL_TABLET | Freq: Four times a day (QID) | ORAL | Status: DC | PRN

## 2023-01-16 MED ORDER — KETOROLAC TROMETHAMINE 15 MG/ML IJ SOLN
15.0000 mg | Freq: Once | INTRAMUSCULAR | Status: AC
  Administered 2023-01-16: 15 mg via INTRAVENOUS
  Filled 2023-01-16: qty 1

## 2023-01-16 MED ORDER — ADULT MULTIVITAMIN W/MINERALS CH
1.0000 | ORAL_TABLET | Freq: Every day | ORAL | Status: DC
  Administered 2023-01-16 – 2023-01-26 (×11): 1
  Filled 2023-01-16 (×11): qty 1

## 2023-01-16 MED ORDER — MIDAZOLAM HCL 2 MG/2ML IJ SOLN: 2.0000 mg | Freq: Once | INTRAMUSCULAR | Status: AC

## 2023-01-16 MED ORDER — FENTANYL BOLUS VIA INFUSION
50.0000 ug | INTRAVENOUS | Status: DC | PRN
  Administered 2023-01-16: 75 ug via INTRAVENOUS
  Administered 2023-01-16 (×2): 100 ug via INTRAVENOUS
  Administered 2023-01-16: 75 ug via INTRAVENOUS
  Administered 2023-01-17 – 2023-01-18 (×11): 100 ug via INTRAVENOUS
  Administered 2023-01-18: 50 ug via INTRAVENOUS

## 2023-01-16 MED ORDER — PANTOPRAZOLE SODIUM 40 MG IV SOLR
40.0000 mg | Freq: Every day | INTRAVENOUS | Status: DC
  Administered 2023-01-16 – 2023-01-24 (×9): 40 mg via INTRAVENOUS
  Filled 2023-01-16 (×9): qty 10

## 2023-01-16 MED ORDER — FAMOTIDINE 20 MG PO TABS
20.0000 mg | ORAL_TABLET | Freq: Two times a day (BID) | ORAL | Status: DC
  Administered 2023-01-16 (×2): 20 mg
  Filled 2023-01-16 (×2): qty 1

## 2023-01-16 MED ORDER — ETOMIDATE 2 MG/ML IV SOLN
INTRAVENOUS | Status: AC
  Administered 2023-01-16: 20 mg via INTRAVENOUS
  Filled 2023-01-16: qty 20

## 2023-01-16 MED ORDER — NOREPINEPHRINE 4 MG/250ML-% IV SOLN
INTRAVENOUS | Status: AC
  Administered 2023-01-16: 5 ug/min via INTRAVENOUS
  Filled 2023-01-16: qty 250

## 2023-01-16 MED ORDER — PROPOFOL 1000 MG/100ML IV EMUL
5.0000 ug/kg/min | INTRAVENOUS | Status: DC
  Administered 2023-01-16: 40 ug/kg/min via INTRAVENOUS
  Administered 2023-01-16: 25 ug/kg/min via INTRAVENOUS
  Administered 2023-01-16: 35 ug/kg/min via INTRAVENOUS
  Administered 2023-01-16: 5 ug/kg/min via INTRAVENOUS
  Administered 2023-01-17: 40 ug/kg/min via INTRAVENOUS
  Administered 2023-01-17: 50 ug/kg/min via INTRAVENOUS
  Administered 2023-01-17: 35 ug/kg/min via INTRAVENOUS
  Administered 2023-01-17: 50 ug/kg/min via INTRAVENOUS
  Administered 2023-01-17: 35 ug/kg/min via INTRAVENOUS
  Administered 2023-01-17 – 2023-01-18 (×4): 50 ug/kg/min via INTRAVENOUS
  Administered 2023-01-19: 5 ug/kg/min via INTRAVENOUS
  Filled 2023-01-16 (×15): qty 100

## 2023-01-16 MED ORDER — ROCURONIUM BROMIDE 50 MG/5ML IV SOLN
100.0000 mg | Freq: Once | INTRAVENOUS | Status: AC
  Filled 2023-01-16: qty 10

## 2023-01-16 MED ORDER — MIDAZOLAM HCL 2 MG/2ML IJ SOLN
INTRAMUSCULAR | Status: AC
  Administered 2023-01-16: 2 mg via INTRAVENOUS
  Filled 2023-01-16: qty 2

## 2023-01-16 MED ORDER — STERILE WATER FOR INJECTION IJ SOLN
INTRAMUSCULAR | Status: AC
  Administered 2023-01-16: 2.1 mL
  Filled 2023-01-16: qty 10

## 2023-01-16 MED ORDER — PHENOBARBITAL 32.4 MG PO TABS: 97.2000 mg | ORAL_TABLET | Freq: Three times a day (TID) | ORAL | Status: DC

## 2023-01-16 MED ORDER — ACETAMINOPHEN 160 MG/5ML PO SOLN
650.0000 mg | Freq: Four times a day (QID) | ORAL | Status: DC | PRN
  Administered 2023-01-17 – 2023-01-25 (×7): 650 mg
  Filled 2023-01-16 (×7): qty 20.3

## 2023-01-16 MED ORDER — ALBUTEROL SULFATE (2.5 MG/3ML) 0.083% IN NEBU
2.5000 mg | INHALATION_SOLUTION | RESPIRATORY_TRACT | Status: DC
  Administered 2023-01-16 – 2023-01-18 (×9): 2.5 mg via RESPIRATORY_TRACT
  Filled 2023-01-16 (×10): qty 3

## 2023-01-16 MED ORDER — FENTANYL CITRATE PF 50 MCG/ML IJ SOSY
50.0000 ug | PREFILLED_SYRINGE | Freq: Once | INTRAMUSCULAR | Status: AC
  Administered 2023-01-16: 50 ug via INTRAVENOUS

## 2023-01-16 MED ORDER — FENTANYL 2500MCG IN NS 250ML (10MCG/ML) PREMIX INFUSION
50.0000 ug/h | INTRAVENOUS | Status: DC
  Administered 2023-01-16: 50 ug/h via INTRAVENOUS
  Administered 2023-01-17: 175 ug/h via INTRAVENOUS
  Filled 2023-01-16 (×2): qty 250

## 2023-01-16 MED ORDER — PHENOBARBITAL SODIUM 65 MG/ML IJ SOLN
65.0000 mg | Freq: Three times a day (TID) | INTRAMUSCULAR | Status: DC
  Filled 2023-01-16: qty 1

## 2023-01-16 MED ORDER — PHENOBARBITAL 32.4 MG PO TABS: 64.8000 mg | ORAL_TABLET | Freq: Three times a day (TID) | ORAL | Status: DC

## 2023-01-16 MED ORDER — FOLIC ACID 1 MG PO TABS
1.0000 mg | ORAL_TABLET | Freq: Every day | ORAL | Status: DC
  Administered 2023-01-16 – 2023-01-24 (×9): 1 mg
  Filled 2023-01-16 (×9): qty 1

## 2023-01-16 MED ORDER — ORAL CARE MOUTH RINSE
15.0000 mL | OROMUCOSAL | Status: DC
  Administered 2023-01-16 – 2023-01-17 (×10): 15 mL via OROMUCOSAL

## 2023-01-16 MED ORDER — SODIUM CHLORIDE 0.9 % IV SOLN
3.0000 g | Freq: Four times a day (QID) | INTRAVENOUS | Status: AC
  Administered 2023-01-16 – 2023-01-21 (×20): 3 g via INTRAVENOUS
  Filled 2023-01-16 (×20): qty 8

## 2023-01-16 MED ORDER — VITAL AF 1.2 CAL PO LIQD
1000.0000 mL | ORAL | Status: DC
  Administered 2023-01-17 – 2023-01-21 (×5): 1000 mL

## 2023-01-16 MED ORDER — PHENOBARBITAL 32.4 MG PO TABS: 32.4000 mg | ORAL_TABLET | Freq: Three times a day (TID) | ORAL | Status: DC

## 2023-01-16 MED ORDER — GUAIFENESIN 100 MG/5ML PO LIQD
15.0000 mL | ORAL | Status: DC
  Administered 2023-01-16 – 2023-01-26 (×53): 15 mL
  Filled 2023-01-16: qty 20
  Filled 2023-01-16 (×6): qty 15
  Filled 2023-01-16: qty 20
  Filled 2023-01-16 (×5): qty 15
  Filled 2023-01-16: qty 20
  Filled 2023-01-16 (×12): qty 15
  Filled 2023-01-16: qty 20
  Filled 2023-01-16 (×6): qty 15
  Filled 2023-01-16: qty 20
  Filled 2023-01-16 (×5): qty 15
  Filled 2023-01-16 (×2): qty 20
  Filled 2023-01-16 (×3): qty 15
  Filled 2023-01-16: qty 20
  Filled 2023-01-16: qty 15
  Filled 2023-01-16: qty 20
  Filled 2023-01-16 (×4): qty 15

## 2023-01-16 MED ORDER — DOCUSATE SODIUM 50 MG/5ML PO LIQD
100.0000 mg | Freq: Two times a day (BID) | ORAL | Status: DC
  Administered 2023-01-16 – 2023-01-22 (×10): 100 mg
  Filled 2023-01-16 (×10): qty 10

## 2023-01-16 MED ORDER — ACETAMINOPHEN 160 MG/5ML PO SOLN
ORAL | Status: AC
  Administered 2023-01-16: 650 mg
  Filled 2023-01-16: qty 20.3

## 2023-01-16 MED ORDER — NOREPINEPHRINE 4 MG/250ML-% IV SOLN: 1.0000 ug/min | INTRAVENOUS | Status: DC

## 2023-01-16 MED ORDER — PHENOBARBITAL SODIUM 65 MG/ML IJ SOLN: 32.5000 mg | Freq: Three times a day (TID) | INTRAMUSCULAR | Status: DC

## 2023-01-16 MED ORDER — VITAL HIGH PROTEIN PO LIQD
1000.0000 mL | ORAL | Status: DC
  Administered 2023-01-16 – 2023-01-17 (×2): 1000 mL

## 2023-01-16 MED ORDER — POLYETHYLENE GLYCOL 3350 17 G PO PACK
17.0000 g | PACK | Freq: Every day | ORAL | Status: DC
  Administered 2023-01-16 – 2023-01-19 (×3): 17 g
  Filled 2023-01-16 (×4): qty 1

## 2023-01-16 NOTE — Consult Note (Signed)
NAME:  Jermaine [name], MRN:  [id number], DOB:  12-06-1965, LOS: 2 ADMISSION DATE:  01/14/2023, CONSULTATION DATE:  01/16/23 REFERRING MD:  TRH, CHIEF COMPLAINT:  ETOH withdrawal   History of Present Illness:  HPI obtained from EMR as pt remains encephalopathic and agitated.  57 year old male with PMH of anxiety, depression, and ETOH abuse who presented 8/19 after reports of binge drinking for the last 2-3 months and decided to quit.  Last ETOH 8/19.  Admitted to [facility] in active ETOH withdrawal, ETOH 342 on admit, started on withdrawal protocol, however worsening agitation and secretions overnight 8/20, requiring 4L [place].  Was started on precedex but CIWAs remained 18-43.  Phenobarbital taper ordered by but not able to be given while NPO.  PCCM consulted for further evaluation.    Pertinent  Medical History   Past Medical History:  Diagnosis Date   Alcohol abuse    Anxiety    Class 1 obesity 09/14/2021   Depression     Significant Hospital Events: Including procedures, antibiotic start and stop dates in addition to other pertinent events   8/19 admitted ETOH withdrawal  Interim History / Subjective:  Copious secretions overnight and ongoing, on 4L [place] Phenobarb taper ordered but not given due to NPO Dex 1.2, remains intermittent agitated and requiring 4 point soft wrist restraints.   Objective   Blood pressure (!) 178/101, pulse 81, temperature 98.8 F (37.1 C), temperature source Axillary, resp. rate (!) 34, height 5\' 10"  (1.778 m), weight 93.5 kg, SpO2 98%.        Intake/Output Summary (Last 24 hours) at 01/16/2023 0743 Last data filed at 01/16/2023 0445 Gross per 24 hour  Intake 4004.87 ml  Output 1675 ml  Net 2329.87 ml   Filed Weights   01/14/23 1315 01/15/23 1033  Weight: 92.1 kg 93.5 kg    Examination: Dex 1.2 General:  gurgling and ill appearing older male sitting upright in bed in mild distress, in four point soft restraints, easily agitated HEENT: pupils 2/reactive,  anicteric, unable to assess orally due to agitation, audible airway secretions despite decent cough, swallows Neuro:  resting but easily awakens, agitated, verbally aggressive, confused, MAE, not following commands CV: rr, SR PULM:  tachypneic in 20s, mildly labored, audible gurgling despite pt's attempts to clear airway, diffuse rhonchi R> L, [place] 4L, thick tan secretions expectorated GI: some abd accessory use otherwise soft, hypobs, condom cath Extremities: warm/dry, no LE edema  Skin: no rashes  Resolved Hospital Problem list    Assessment & Plan:   Delirium tremens  ETOH abuse - airway protection as below - PAD protocol as below - daily thiamine/ folate/ MVI - seizure precautions - CBGs q4, consider starting TF  - monitor for urinary retention while sedated  - ETOH cessation counseling when appropriate    Acute hypoxic respiratory insufficiency  Suspected aspiration pneumonitis/ pneumonia - unable to maintain airway due to secretions or follow commands, will proceed with intubation  - full MV support, 4-8cc/kg IBW with goal Pplat <30 and DP<15  - VAP prevention protocol/ PPI - PAD protocol for sedation> change precedex over to propofol, fentanyl gtt> minimize to PRN as able and start IV phenobarb taper with scheduled bowel regimen - post intubation CXR/ ABG - CXR in am - wean FiO2 as able for SpO2 >92% - daily SAT & SBT  - prn BD - send trach asp - unasyn per pharmacy.  MRSA PCR neg    Best Practice (right click and "Reselect all  SmartList Selections" daily)   Diet/type: NPO DVT prophylaxis: LMWH GI prophylaxis: PPI Lines: N/A Foley:  N/A Code Status:  full code Last date of multidisciplinary goals of care discussion [pta]  - pending family update 8/21.  NOK listed as Jontez Stitt at [phone] number not in service.  Will work on getting NOK notified, possible TOC consult.   Labs   CBC: Recent Labs  Lab 01/14/23 0417 01/15/23 0401 01/16/23 0257  WBC 9.7  5.9 9.0  NEUTROABS 7.5  --  7.5  HGB 18.3* 15.0 14.6  HCT 52.8* 43.8 43.2  MCV 91.0 94.8 94.9  PLT 133* 111* 88*    Basic Metabolic Panel: Recent Labs  Lab 01/14/23 0417 01/15/23 0401 01/16/23 0257  NA 140 135 136  K 3.7 3.5 4.1  CL 100 99 102  CO2 25 29 24   GLUCOSE 103* 99 129*  BUN 22* 22* 13  CREATININE 1.11 1.01 0.86  CALCIUM 8.1* 7.9* 7.7*  MG  --   --  2.0  PHOS  --   --  2.4*   GFR: Estimated Creatinine Clearance: 108.8 mL/min (by C-G formula based on SCr of 0.86 mg/dL). Recent Labs  Lab 01/14/23 0417 01/15/23 0401 01/16/23 0257  WBC 9.7 5.9 9.0    Liver Function Tests: Recent Labs  Lab 01/14/23 0417 01/15/23 0401 01/16/23 0257  AST 51* 39 37  ALT 38 27 24  ALKPHOS 56 65 45  BILITOT 0.9 1.5* 0.8  PROT 7.3 6.0* 6.3*  ALBUMIN 4.3 3.4* 3.4*   Recent Labs  Lab 01/14/23 0417  LIPASE 57*   No results for input(s): "AMMONIA" in the last 168 hours.  ABG    Component Value Date/Time   HCO3 29.2 (H) 04/22/2022 2325   O2SAT 97.5 04/22/2022 2325     Coagulation Profile: No results for input(s): "INR", "PROTIME" in the last 168 hours.  Cardiac Enzymes: No results for input(s): "CKTOTAL", "CKMB", "CKMBINDEX", "TROPONINI" in the last 168 hours.  HbA1C: Hgb A1c MFr Bld  Date/Time Value Ref Range Status  03/20/2019 03:46 AM 5.4 4.8 - 5.6 % Final    Comment:    (NOTE) Pre diabetes:          5.7%-6.4% Diabetes:              >6.4% Glycemic control for   <7.0% adults with diabetes     CBG: No results for input(s): "GLUCAP" in the last 168 hours.  Review of Systems:   unable  Past Medical History:  He,  has a past medical history of Alcohol abuse, Anxiety, Class 1 obesity (09/14/2021), and Depression.   Surgical History:   Past Surgical History:  Procedure Laterality Date   HERNIA REPAIR       Social History:   reports that he has never smoked. He has never used smokeless tobacco. He reports current alcohol use of about 20.0 standard  drinks of alcohol per week. He reports that he does not currently use drugs.   Family History:  His family history is not on file. He was adopted.   Allergies No Known Allergies   Home Medications  Prior to Admission medications   Medication Sig Start Date End Date Taking? Authorizing Provider  escitalopram (LEXAPRO) 20 MG tablet Take 20 mg by mouth daily. 02/27/19  Yes [provider]  Multiple Vitamin (MULTIVITAMIN WITH MINERALS) TABS tablet Take 1 tablet by mouth daily. 05/20/22  Yes [name], MD  thiamine (VITAMIN B-1) 100 MG tablet Take 1 tablet (  100 mg total) by mouth daily. 05/20/22  Yes [name], MD  folic acid (FOLVITE) 1 MG tablet Take 1 tablet (1 mg total) by mouth daily. Patient not taking: Reported on 01/14/2023 05/20/22   [name], MD  hydrOXYzine (ATARAX) 25 MG tablet Take 1 tablet (25 mg total) by mouth 3 (three) times daily as needed for itching or anxiety. Patient not taking: Reported on 01/14/2023 05/20/22   [name], MD  LORazepam (ATIVAN) 1 MG tablet Take 1 tablet (1 mg total) by mouth every 6 (six) hours as needed for anxiety. Patient not taking: Reported on 01/14/2023 05/22/22   [name], MD     Critical care time: 45 mins       Posey Boyer, MSN, NP, AG-ACNP-BC  Pulmonary & Critical Care 01/16/2023, 7:43 AM  See Amion for pager If no response to pager , please call [phone] until 7pm After 7:00 pm call Elink  [phone]

## 2023-01-16 NOTE — Progress Notes (Signed)
Pharmacy Antibiotic Note  Jermaine [name] is a 57 y.o. male admitted on 01/14/2023 with  aspiration PNA .  Pharmacy has been consulted for Unasyn dosing.  Plan: Unasyn 3g IV q6 Will sign off  Height: 5\' 10"  (177.8 cm) Weight: 93.5 kg (206 lb 2.1 oz) IBW/kg (Calculated) : 73  Temp (24hrs), Avg:99.5 F (37.5 C), Min:97.6 F (36.4 C), Max:101.3 F (38.5 C)  Recent Labs  Lab 01/14/23 0417 01/15/23 0401 01/16/23 0257  WBC 9.7 5.9 9.0  CREATININE 1.11 1.01 0.86    Estimated Creatinine Clearance: 108.8 mL/min (by C-G formula based on SCr of 0.86 mg/dL).    No Known Allergies   Thank you for allowing pharmacy to be a part of this patient's care.  Berkley Harvey 01/16/2023 8:13 AM

## 2023-01-16 NOTE — Progress Notes (Signed)
Brief PCCM Note: Contacted by on-call hospitalist team.  Placed on Precedex at 11 AM of 8/20.  Multiple doses of Ativan.  Still restless intermittently.  Recommend starting on phenobarbital taper.  Continue to score with CIWA and give benzodiazepines as needed.  Continue Precedex.  Suspect will not be able to wean from Precedex within 24 hours and will need formal PCCM consultation in the morning.

## 2023-01-16 NOTE — Progress Notes (Signed)
Patient overnight with severe alcohol withdrawal and during early morning rounds noted to be lethargic, unable to protect airway with secretions and requiring very high doses of Precedex Ativan along with phenobarbital.  Critical care at bedside. care transferred to ICU services.  Patient was ultimately intubated to protect airway.  TRH will sign off.  Will take over when able to come out of ICU.  No charge visit.

## 2023-01-16 NOTE — Progress Notes (Signed)
Sister (and mother on speaker) updated by phone and plan of care.  Both live in [place].  No family locally.   Sister reports pt has struggled with ETOH abuse and been in rehab at least 3 times prior.

## 2023-01-16 NOTE — Procedures (Signed)
Intubation Procedure Note  Jermaine [name]  [id number]  July 03, 1965  Date:01/16/23  Time:8:19 AM   Provider Performing:Brees Hounshell Mechele Collin, MD    Procedure: Intubation (31500)  Indication(s) Respiratory Failure  Consent Risks of the procedure as well as the alternatives and risks of each were explained to the patient and/or caregiver.  Consent for the procedure was obtained and is signed in the bedside chart   Anesthesia Etomidate, Versed, Fentanyl, and Rocuronium   Time Out Verified patient identification, verified procedure, site/side was marked, verified correct patient position, special equipment/implants available, medications/allergies/relevant history reviewed, required imaging and test results available.   Sterile Technique Usual hand hygeine, masks, and gloves were used   Procedure Description Patient positioned in bed supine.  Sedation given as noted above.  Patient was intubated with endotracheal tube using Glidescope.  View was Grade 1 full glottis .  Number of attempts was 1.  Colorimetric CO2 detector was consistent with tracheal placement.   Complications/Tolerance None; patient tolerated the procedure well. Chest X-ray is ordered to verify placement.   EBL Minimal   Specimen(s) None

## 2023-01-16 NOTE — Plan of Care (Signed)
  Problem: Clinical Measurements: Goal: Respiratory complications will improve 01/16/2023 1516 by Donnita Falls, RN Outcome: Not Progressing 01/16/2023 1516 by Donnita Falls, RN Outcome: Not Progressing   Problem: Activity: Goal: Risk for activity intolerance will decrease 01/16/2023 1516 by Donnita Falls, RN Outcome: Not Progressing 01/16/2023 1516 by Donnita Falls, RN Outcome: Not Progressing

## 2023-01-16 NOTE — Plan of Care (Signed)
  Problem: Pain Managment: Goal: General experience of comfort will improve Outcome: Progressing   Problem: Health Behavior/Discharge Planning: Goal: Ability to manage health-related needs will improve Outcome: Not Progressing   Problem: Clinical Measurements: Goal: Respiratory complications will improve Outcome: Not Progressing   Problem: Coping: Goal: Level of anxiety will decrease Outcome: Not Progressing   Problem: Safety: Goal: Non-violent Restraint(s) Outcome: Not Progressing

## 2023-01-16 NOTE — Progress Notes (Signed)
ICU Consult    NAME:  Jermaine [name], MRN:  [id number], DOB:  [birth year], LOS: 2 ADMISSION DATE:  01/14/2023, CONSULTATION DATE:  01/16/2023 REFERRING:  Johann Capers ACNPC-AG  CHIEF COMPLAINT:  DTs  Brief History   57 year old male history significant of anxiety depression as well as alcohol abuse admitted through the ER with alcohol withdrawal. Patient has a history of trying to stop drinking and he has gone several months at a time without drinking.  Stated in ER that he has been on a binge for 2 to 3 months. Last drink was stated as 01/14/2023  History of present illness   Marked DTs with hallucinations and tremor requiring frequent Ativan. Patient has had greater than 20 mg of Ativan since 1830 hrs. 01/15/2023. Patient has also had Zyprexa. Patient is currently on Precedex drip. Currently in restraints.  Past Medical History  Alcohol abuse Anxiety Class I obesity Depression  Significant Hospital Events   DTs with hallucinations and tremor. Requiring drip and IV push medication.  Consults:  CCM  Significant Diagnostic Tests:  Imaging:  "IMPRESSION: 1. Low lung volumes with no active disease. 2. Persistent enlarged main pulmonary artery-correlate for pulmonary hypertension.     Electronically Signed   By: Tish Frederickson M.D.   On: 01/15/2023 22:20   Objective   Blood pressure (!) 167/92, pulse 76, temperature 99.5 F (37.5 C), temperature source Axillary, resp. rate (!) 34, height 5\' 10"  (1.778 m), weight 93.5 kg, SpO2 98%.        Intake/Output Summary (Last 24 hours) at 01/16/2023 0245 Last data filed at 01/15/2023 2345 Gross per 24 hour  Intake 2926.89 ml  Output 1675 ml  Net 1251.89 ml   Filed Weights   01/14/23 1315 01/15/23 1033  Weight: 92.1 kg 93.5 kg    Examination: General: sedated Lungs: Rhonchi,  tachypnea Cardiovascular: RRR, no murmur, no JVD  Abdomen: soft, nontender, normal bowel sounds Extremities: warm, well-perfused without cyanosis, edema Neuro: Movement of extremities x 4, strong x 4  Assessment & Plan:  Withdrawal-continue Precedex, continue Ativan, phenobarbital taper.  Best practice:  Diet: NPO due to agitation/sedation, Regular otherwise  Pain/Anxiety/Delirium protocol  VAP protocol  DVT prophylaxis: Lovenox4 Code Status: FULL Disposition: ICU   Labs   CBC: Recent Labs  Lab 01/14/23 0417 01/15/23 0401  WBC 9.7 5.9  NEUTROABS 7.5  --   HGB 18.3* 15.0  HCT 52.8* 43.8  MCV 91.0 94.8  PLT 133* 111*    Basic Metabolic Panel: Recent Labs  Lab 01/14/23 0417 01/15/23 0401  NA 140 135  K 3.7 3.5  CL 100 99  CO2 25 29  GLUCOSE 103* 99  BUN 22* 22*  CREATININE 1.11 1.01  CALCIUM 8.1* 7.9*   GFR: Estimated Creatinine Clearance: 92.7 mL/min (by C-G formula based on SCr of 1.01 mg/dL). Recent Labs  Lab 01/14/23 0417 01/15/23 0401  WBC 9.7 5.9    Liver Function Tests: Recent Labs  Lab 01/14/23 0417 01/15/23 0401  AST 51* 39  ALT 38 27  ALKPHOS 56 65  BILITOT 0.9 1.5*  PROT 7.3 6.0*  ALBUMIN 4.3 3.4*   Recent Labs  Lab 01/14/23 0417  LIPASE 57*   No results for input(s): "AMMONIA" in the last 168 hours.  ABG    Component Value Date/Time   HCO3 29.2 (H) 04/22/2022 2325   O2SAT 97.5 04/22/2022 2325     Coagulation Profile: No results for input(s): "INR", "PROTIME" in the last 168 hours.  Cardiac Enzymes: No results for input(s): "CKTOTAL", "CKMB", "CKMBINDEX", "TROPONINI" in the last 168 hours.  HbA1C: Hgb A1c MFr Bld  Date/Time Value Ref Range Status  03/20/2019 03:46 AM 5.4 4.8 - 5.6 % Final    Comment:    (NOTE) Pre diabetes:          5.7%-6.4% Diabetes:              >6.4% Glycemic control for   <7.0% adults with diabetes     CBG: No results for input(s): "GLUCAP" in the last 168 hours.  Review of Systems:    unable to obtain  Past Medical History  He,  has a past medical history of Alcohol abuse, Anxiety, Class 1 obesity (09/14/2021), and Depression.   Surgical History    Past Surgical History:  Procedure Laterality Date   HERNIA REPAIR       Social History   reports that he has never smoked. He has never used smokeless tobacco. He reports current alcohol use of about 20.0 standard drinks of alcohol per week. He reports that he does not currently use drugs.   Family History   His family history is not on file. He was adopted.   Allergies No Known Allergies   Home Medications  Prior to Admission medications   Medication Sig Start Date End Date Taking? Authorizing Provider  escitalopram (LEXAPRO) 20 MG tablet Take 20 mg by mouth daily. 02/27/19  Yes [provider]  Multiple Vitamin (MULTIVITAMIN WITH MINERALS) TABS tablet Take 1 tablet by mouth daily. 05/20/22  Yes [name], MD  thiamine (VITAMIN B-1) 100 MG tablet Take 1 tablet (100 mg total) by mouth daily. 05/20/22  Yes [name], MD  folic acid (FOLVITE) 1 MG tablet Take 1 tablet (1 mg total) by mouth daily. Patient not taking: Reported on 01/14/2023 05/20/22   [name], MD  hydrOXYzine (ATARAX) 25 MG tablet Take 1 tablet (25 mg total) by mouth 3 (three) times daily as needed for itching or anxiety. Patient not taking: Reported on 01/14/2023 05/20/22   [name], MD  LORazepam (ATIVAN) 1 MG tablet Take 1 tablet (1 mg total) by mouth every 6 (six) hours as needed for anxiety. Patient not taking: Reported on 01/14/2023 05/22/22   [name], MD     Chinita Greenland MSNA MSN ACNPC-AG Acute Care Nurse Practitioner [facility]

## 2023-01-16 NOTE — Progress Notes (Signed)
Initial Nutrition Assessment  DOCUMENTATION CODES:   Not applicable  INTERVENTION:   OG tube tip noted to be in the distal stomach per X-ray  Initiate TF's  Vital 1.2 @ 65 ml/hr (1560 ml/day)  Start @20  ml/hr and advance by 10ml q8 hrs to goal rate -TF at goal rate provides 1872 kcal, 117 g protein, 1265 ml fluid    NUTRITION DIAGNOSIS:   Inadequate oral intake related to inability to eat as evidenced by NPO status.  GOAL:   Patient will meet greater than or equal to 90% of their needs   MONITOR:   TF tolerance, Skin, I & O's, Labs, Weight trends  REASON FOR ASSESSMENT:   Consult Enteral/tube feeding initiation and management  ASSESSMENT:   57 y.o. male with PMHx including anxiety, depression, EtOH abuse who was admitted for alcohol withdrawal  RD visited patient at bedside who was sedated and intubated. RD reviewed chart for nutrition hx:  Agitated/restless, intubated @8AM    Patient has tried to stop drinking several times and will quit for months at a time. Recently, he has been on a binge drinking 4 Loco's the past 2-3 months. Last drink was 8/19 when he grew nauseous and started to vomit. Patient wants to stop drinking.    Labs: CBG 124, phos 2.4, lipase 57 Meds: colace, pepcid, folvite, protonix, MVI, miralax, thiamine  Continuous meds: NS, fentanyl, propofol @22 .4 ml/hr (provides 591 additional calories in lipids)   Wt:  01/15/23 93.5 kg  05/12/22 88.1 kg  04/23/22 92.6 kg  09/20/21 101.2 kg    I/O's: +3 L since admission   NUTRITION - FOCUSED PHYSICAL EXAM:  Flowsheet Row Most Recent Value  Orbital Region No depletion  Upper Arm Region No depletion  Thoracic and Lumbar Region No depletion  Buccal Region No depletion  Temple Region No depletion  Clavicle Bone Region No depletion  Clavicle and Acromion Bone Region No depletion  Scapular Bone Region Unable to assess  Dorsal Hand No depletion  Patellar Region No depletion  Anterior Thigh  Region No depletion  Posterior Calf Region No depletion  Edema (RD Assessment) None  Hair Reviewed  Eyes Unable to assess  Mouth Unable to assess  Skin Reviewed  Nails Unable to assess       Diet Order:   Diet Order     None       EDUCATION NEEDS:   Not appropriate for education at this time  Skin:  Skin Assessment: Reviewed RN Assessment  Last BM:  8/20  type 5  Height:   Ht Readings from Last 1 Encounters:  01/15/23 5\' 10"  (1.778 m)    Weight:   Wt Readings from Last 1 Encounters:  01/15/23 93.5 kg    Ideal Body Weight:     BMI:  Body mass index is 29.58 kg/m.  Estimated Nutritional Needs:   Kcal:  1860-2050  Protein:  110-140  Fluid:  >/= 2L    Leodis Rains, RDN, LDN  Clinical Nutrition'

## 2023-01-16 NOTE — Progress Notes (Signed)
eLink Physician-Brief Progress Note Patient Name: Jermaine [name] DOB: [birth year] MRN: [id number]   Date of Service  01/16/2023  HPI/Events of Note  Hypotension in the context of sedation, patient is + 3000 ml fluid-wise.  eICU Interventions  Levophed gtt started, bedside RN instructed to titrate down Propofol gtt and if necessary increase Fentanyl gtt for overall lowered hemodynamic impact. MAP now > 70 mmHg.        Migdalia Dk 01/16/2023, 7:54 PM

## 2023-01-17 ENCOUNTER — Inpatient Hospital Stay (HOSPITAL_COMMUNITY): Payer: BLUE CROSS/BLUE SHIELD

## 2023-01-17 ENCOUNTER — Other Ambulatory Visit: Payer: Self-pay

## 2023-01-17 ENCOUNTER — Encounter (HOSPITAL_COMMUNITY): Payer: Self-pay | Admitting: Internal Medicine

## 2023-01-17 DIAGNOSIS — R6521 Severe sepsis with septic shock: Secondary | ICD-10-CM | POA: Diagnosis not present

## 2023-01-17 DIAGNOSIS — A419 Sepsis, unspecified organism: Secondary | ICD-10-CM | POA: Diagnosis not present

## 2023-01-17 DIAGNOSIS — R569 Unspecified convulsions: Secondary | ICD-10-CM

## 2023-01-17 DIAGNOSIS — F10930 Alcohol use, unspecified with withdrawal, uncomplicated: Secondary | ICD-10-CM | POA: Diagnosis not present

## 2023-01-17 LAB — CBC
HCT: 41.1 % (ref 39.0–52.0)
HCT: 42.3 % (ref 39.0–52.0)
Hemoglobin: 13.6 g/dL (ref 13.0–17.0)
Hemoglobin: 14 g/dL (ref 13.0–17.0)
MCH: 31.7 pg (ref 26.0–34.0)
MCH: 32.5 pg (ref 26.0–34.0)
MCHC: 33.1 g/dL (ref 30.0–36.0)
MCHC: 33.1 g/dL (ref 30.0–36.0)
MCV: 95.8 fL (ref 80.0–100.0)
MCV: 98.1 fL (ref 80.0–100.0)
Platelets: 100 10*3/uL — ABNORMAL LOW (ref 150–400)
Platelets: 101 10*3/uL — ABNORMAL LOW (ref 150–400)
RBC: 4.29 MIL/uL (ref 4.22–5.81)
RBC: 4.31 MIL/uL (ref 4.22–5.81)
RDW: 13.9 % (ref 11.5–15.5)
RDW: 13.8 % (ref 11.5–15.5)
WBC: 8.2 10*3/uL (ref 4.0–10.5)
WBC: 4.5 10*3/uL (ref 4.0–10.5)
nRBC: 0 % (ref 0.0–0.2)
nRBC: 0 % (ref 0.0–0.2)

## 2023-01-17 LAB — GLUCOSE, CAPILLARY
Glucose-Capillary: 105 mg/dL — ABNORMAL HIGH (ref 70–99)
Glucose-Capillary: 112 mg/dL — ABNORMAL HIGH (ref 70–99)
Glucose-Capillary: 147 mg/dL — ABNORMAL HIGH (ref 70–99)
Glucose-Capillary: 185 mg/dL — ABNORMAL HIGH (ref 70–99)
Glucose-Capillary: 174 mg/dL — ABNORMAL HIGH (ref 70–99)

## 2023-01-17 LAB — CK: Total CK: 440 U/L — ABNORMAL HIGH (ref 49–397)

## 2023-01-17 LAB — MAGNESIUM
Magnesium: 2.5 mg/dL — ABNORMAL HIGH (ref 1.7–2.4)
Magnesium: 2.7 mg/dL — ABNORMAL HIGH (ref 1.7–2.4)

## 2023-01-17 LAB — COMPREHENSIVE METABOLIC PANEL
ALT: 22 U/L (ref 0–44)
AST: 37 U/L (ref 15–41)
Albumin: 3.2 g/dL — ABNORMAL LOW (ref 3.5–5.0)
Alkaline Phosphatase: 42 U/L (ref 38–126)
Anion gap: 7 (ref 5–15)
BUN: 11 mg/dL (ref 6–20)
CO2: 25 mmol/L (ref 22–32)
Calcium: 7.6 mg/dL — ABNORMAL LOW (ref 8.9–10.3)
Chloride: 105 mmol/L (ref 98–111)
Creatinine, Ser: 0.97 mg/dL (ref 0.61–1.24)
GFR, Estimated: >60 mL/min (ref >60)
Glucose, Bld: 173 mg/dL — ABNORMAL HIGH (ref 70–99)
Potassium: 4.5 mmol/L (ref 3.5–5.1)
Sodium: 137 mmol/L (ref 135–145)
Total Bilirubin: 0.5 mg/dL (ref 0.3–1.2)
Total Protein: 6.7 g/dL (ref 6.5–8.1)

## 2023-01-17 LAB — PHOSPHORUS
Phosphorus: 2.1 mg/dL — ABNORMAL LOW (ref 2.5–4.6)
Phosphorus: 3 mg/dL (ref 2.5–4.6)

## 2023-01-17 LAB — URINALYSIS, ROUTINE W REFLEX MICROSCOPIC
Bilirubin Urine: NEGATIVE
Glucose, UA: NEGATIVE mg/dL
Ketones, ur: NEGATIVE mg/dL
Leukocytes,Ua: NEGATIVE
Nitrite: NEGATIVE
Protein, ur: 100 mg/dL — AB
RBC / HPF: >50 RBC/hpf (ref 0–5)
Specific Gravity, Urine: 1.03 (ref 1.005–1.030)
pH: 5 (ref 5.0–8.0)

## 2023-01-17 LAB — PROTIME-INR
INR: 1.1 (ref 0.8–1.2)
Prothrombin Time: 14.8 seconds (ref 11.4–15.2)

## 2023-01-17 LAB — BASIC METABOLIC PANEL
Anion gap: 9 (ref 5–15)
BUN: 15 mg/dL (ref 6–20)
CO2: 23 mmol/L (ref 22–32)
Calcium: 7.3 mg/dL — ABNORMAL LOW (ref 8.9–10.3)
Chloride: 103 mmol/L (ref 98–111)
Creatinine, Ser: 1.05 mg/dL (ref 0.61–1.24)
GFR, Estimated: >60 mL/min (ref >60)
Glucose, Bld: 120 mg/dL — ABNORMAL HIGH (ref 70–99)
Potassium: 2.9 mmol/L — ABNORMAL LOW (ref 3.5–5.1)
Sodium: 135 mmol/L (ref 135–145)

## 2023-01-17 LAB — TRIGLYCERIDES: Triglycerides: 100 mg/dL (ref <150)

## 2023-01-17 LAB — AMMONIA: Ammonia: <10 umol/L (ref 9–35)

## 2023-01-17 MED ORDER — METHYLPREDNISOLONE SODIUM SUCC 40 MG IJ SOLR
40.0000 mg | Freq: Every day | INTRAMUSCULAR | Status: AC
  Administered 2023-01-17 – 2023-01-21 (×5): 40 mg via INTRAVENOUS
  Filled 2023-01-17 (×4): qty 1

## 2023-01-17 MED ORDER — GADOBUTROL 1 MMOL/ML IV SOLN
10.0000 mL | Freq: Once | INTRAVENOUS | Status: AC | PRN
  Administered 2023-01-17: 9 mL via INTRAVENOUS

## 2023-01-17 MED ORDER — VECURONIUM BROMIDE 10 MG IV SOLR: 10.0000 mg | Freq: Once | INTRAVENOUS | Status: AC

## 2023-01-17 MED ORDER — ARFORMOTEROL TARTRATE 15 MCG/2ML IN NEBU
15.0000 ug | INHALATION_SOLUTION | Freq: Two times a day (BID) | RESPIRATORY_TRACT | Status: DC
  Administered 2023-01-17 – 2023-01-27 (×21): 15 ug via RESPIRATORY_TRACT
  Filled 2023-01-17 (×23): qty 2

## 2023-01-17 MED ORDER — STERILE WATER FOR INJECTION IJ SOLN
INTRAMUSCULAR | Status: AC
  Administered 2023-01-17: 10 mL
  Filled 2023-01-17: qty 10

## 2023-01-17 MED ORDER — VECURONIUM BROMIDE 10 MG IV SOLR
INTRAVENOUS | Status: AC
  Administered 2023-01-17: 10 mg via INTRAVENOUS
  Filled 2023-01-17: qty 10

## 2023-01-17 MED ORDER — MIDAZOLAM HCL 2 MG/2ML IJ SOLN: 4.0000 mg | Freq: Once | INTRAMUSCULAR | Status: AC

## 2023-01-17 MED ORDER — ALBUTEROL SULFATE (2.5 MG/3ML) 0.083% IN NEBU
INHALATION_SOLUTION | RESPIRATORY_TRACT | Status: AC
  Administered 2023-01-17: 2.5 mg
  Filled 2023-01-17: qty 3

## 2023-01-17 MED ORDER — METHYLPREDNISOLONE SODIUM SUCC 40 MG IJ SOLR
40.0000 mg | Freq: Two times a day (BID) | INTRAMUSCULAR | Status: DC
  Filled 2023-01-17: qty 1

## 2023-01-17 MED ORDER — LEVETIRACETAM IN NACL 1000 MG/100ML IV SOLN
1000.0000 mg | Freq: Two times a day (BID) | INTRAVENOUS | Status: DC
  Administered 2023-01-18 – 2023-01-19 (×3): 1000 mg via INTRAVENOUS
  Filled 2023-01-17 (×3): qty 100

## 2023-01-17 MED ORDER — MIDAZOLAM HCL 2 MG/2ML IJ SOLN
2.0000 mg | INTRAMUSCULAR | Status: DC | PRN
  Administered 2023-01-17 – 2023-01-18 (×5): 2 mg via INTRAVENOUS
  Filled 2023-01-17 (×3): qty 2

## 2023-01-17 MED ORDER — PHENYLEPHRINE 80 MCG/ML (10ML) SYRINGE FOR IV PUSH (FOR BLOOD PRESSURE SUPPORT)
PREFILLED_SYRINGE | INTRAVENOUS | Status: AC
  Filled 2023-01-17: qty 10

## 2023-01-17 MED ORDER — SODIUM CHLORIDE 3 % IN NEBU
4.0000 mL | INHALATION_SOLUTION | Freq: Two times a day (BID) | RESPIRATORY_TRACT | Status: AC
  Administered 2023-01-17 – 2023-01-19 (×6): 4 mL via RESPIRATORY_TRACT
  Filled 2023-01-17: qty 4
  Filled 2023-01-17: qty 15
  Filled 2023-01-17: qty 4
  Filled 2023-01-17 (×2): qty 15
  Filled 2023-01-17: qty 4

## 2023-01-17 MED ORDER — LACTATED RINGERS IV BOLUS
1000.0000 mL | Freq: Once | INTRAVENOUS | Status: AC
  Administered 2023-01-17: 1000 mL via INTRAVENOUS

## 2023-01-17 MED ORDER — PHENOBARBITAL SODIUM 130 MG/ML IJ SOLN
130.0000 mg | Freq: Once | INTRAMUSCULAR | Status: AC
  Administered 2023-01-17: 130 mg via INTRAVENOUS
  Filled 2023-01-17: qty 1

## 2023-01-17 MED ORDER — POTASSIUM CHLORIDE 20 MEQ PO PACK: 40.0000 meq | PACK | Freq: Two times a day (BID) | ORAL | Status: DC

## 2023-01-17 MED ORDER — POTASSIUM CHLORIDE 20 MEQ PO PACK
40.0000 meq | PACK | Freq: Once | ORAL | Status: AC
  Administered 2023-01-17: 40 meq
  Filled 2023-01-17: qty 2

## 2023-01-17 MED ORDER — ORAL CARE MOUTH RINSE
15.0000 mL | OROMUCOSAL | Status: DC
  Administered 2023-01-17 – 2023-01-26 (×104): 15 mL via OROMUCOSAL

## 2023-01-17 MED ORDER — OXYCODONE HCL 5 MG PO TABS
10.0000 mg | ORAL_TABLET | Freq: Four times a day (QID) | ORAL | Status: DC
  Administered 2023-01-17 – 2023-01-24 (×28): 10 mg
  Filled 2023-01-17 (×28): qty 2

## 2023-01-17 MED ORDER — OXYCODONE HCL 5 MG PO TABS
5.0000 mg | ORAL_TABLET | ORAL | Status: DC
  Administered 2023-01-17: 5 mg
  Filled 2023-01-17: qty 1

## 2023-01-17 MED ORDER — MIDAZOLAM HCL 2 MG/2ML IJ SOLN
INTRAMUSCULAR | Status: AC
  Administered 2023-01-17: 2 mg via INTRAVENOUS
  Filled 2023-01-17: qty 2

## 2023-01-17 MED ORDER — SODIUM CHLORIDE 0.9 % IV SOLN
3000.0000 mg | Freq: Once | INTRAVENOUS | Status: AC
  Administered 2023-01-17: 3000 mg via INTRAVENOUS
  Filled 2023-01-17: qty 30

## 2023-01-17 MED ORDER — FENTANYL 2500MCG IN NS 250ML (10MCG/ML) PREMIX INFUSION
50.0000 ug/h | INTRAVENOUS | Status: DC
  Administered 2023-01-17 (×2): 300 ug/h via INTRAVENOUS
  Administered 2023-01-17: 200 ug/h via INTRAVENOUS
  Administered 2023-01-18 (×3): 300 ug/h via INTRAVENOUS
  Administered 2023-01-19: 250 ug/h via INTRAVENOUS
  Administered 2023-01-19: 300 ug/h via INTRAVENOUS
  Filled 2023-01-17 (×7): qty 250

## 2023-01-17 MED ORDER — BUDESONIDE 0.5 MG/2ML IN SUSP
0.5000 mg | Freq: Two times a day (BID) | RESPIRATORY_TRACT | Status: DC
  Administered 2023-01-17 – 2023-01-27 (×21): 0.5 mg via RESPIRATORY_TRACT
  Filled 2023-01-17 (×23): qty 2

## 2023-01-17 MED ORDER — ALBUTEROL SULFATE (2.5 MG/3ML) 0.083% IN NEBU
2.5000 mg | INHALATION_SOLUTION | RESPIRATORY_TRACT | Status: DC | PRN
  Administered 2023-01-17 – 2023-01-19 (×3): 2.5 mg via RESPIRATORY_TRACT
  Filled 2023-01-17 (×2): qty 3

## 2023-01-17 MED ORDER — ORAL CARE MOUTH RINSE: 15.0000 mL | OROMUCOSAL | Status: DC | PRN

## 2023-01-17 MED ORDER — POTASSIUM PHOSPHATES 15 MMOLE/5ML IV SOLN
30.0000 mmol | Freq: Once | INTRAVENOUS | Status: AC
  Administered 2023-01-17: 30 mmol via INTRAVENOUS
  Filled 2023-01-17: qty 10

## 2023-01-17 MED ORDER — MIDAZOLAM HCL 2 MG/2ML IJ SOLN
2.0000 mg | INTRAMUSCULAR | Status: DC | PRN
  Administered 2023-01-17: 2 mg via INTRAVENOUS
  Filled 2023-01-17 (×3): qty 2

## 2023-01-17 NOTE — Progress Notes (Signed)
eLink Physician-Brief Progress Note Patient Name: Jermaine [name] DOB: Oct 18, 1965 MRN: [id number]   Date of Service  01/17/2023  HPI/Events of Note  Brain MRI result reviewed.  eICU Interventions          Migdalia Dk 01/17/2023, 11:49 PM

## 2023-01-17 NOTE — Progress Notes (Signed)
Nutrition Brief Note  Visited patient at bedside who is sedated and intubated. Tube feeds Vital 1.2 infusing @20  ml/hr. It is best to continue at trickle rate until pressors are dc'd and propofol is reduced.   Discussed plan with RN at bedside   Labs: CBG 147, K+ 2.9, Mag 2.5, Phos 2.1 Meds: Unasyn, colace, folvite, solu-medrol, MVI, protonix, miralax, potassium phosphate  I/O's: +4.8 L since admission   Leodis Rains, RDN, LDN  Clinical Nutrition

## 2023-01-17 NOTE — Progress Notes (Addendum)
  Called to bedside r/t generalized seizure like activity No change in cont sedation, remains on propofol 30, fent, and phenobarbital taper (still on 97.5mg  q8 and s/p additional 130mg  this am).   Found with pt having generalized tonic clonic movements with right sided decorticate posturing.  Urine now blood tinged.  Pupils 2/ minimally reactive. Glucose 185.  No large hemodynamic shifts today or HTN episodes.  Mild desaturations to 88-92%> placed on 1.0 FiO2, PIP normal, no vent dyssynchrony  Of note, after speaking to sister earlier> she reported hx of pt binge drinking till he passes out.  Unknown hx of trauma pta ER presentation.  No obvious bruising or signs of recurrent falls on admit.    Plts had improved to 100k this am.  Pt was moving all extremities requiring 4pt restraints on CCM am consult 8/21, since required heavier sedation due to agitation and vent dyssynchrony.    Blood pressure 119/70, pulse 69, temperature 98.8 F (37.1 C), resp. rate 20, height 5\' 10"  (1.778 m), weight 95.7 kg, SpO2 97%.  - off peripheral NE    Plan - versed 2mg  now and additional 2mg  as needed to abate/ further suspected seizure like activity.  - increase propofol gtt to 50 for now - stat CTH  - stat CT cervical given unknown hx of trauma pta/ hx of ETOH intoxication.   - stat EEG - stat CBC, CMET, coags and ammonia - stat MRI brain w/o contrast when stable - consider further CNS workup/  LP pending coags/ imaging if above unrevealing.  Has been febrile, but attributed to aspiration/ tan secretions> tx with unasyn thus far - hold lovenox - consider neurology consult   Additional CCT 25 mins  Will update family pending imaging/ labs  Addendum: CTH and cervical negative.  In MRI now.  Sister called and updated on events.  Pending labs and ceribell to be placed on return to ICU.   Dr. Otelia Limes with Neurology requesting pt be transferred to [facility] for LTM monitoring. Pending load with keppra as well.   Sister updated  Posey Boyer, MSN, NP, AG-ACNP-BC [facility] 01/17/2023, 4:08 PM  See Amion for pager If no response to pager , please call [phone] until 7pm After 7:00 pm call Elink  [phone]

## 2023-01-17 NOTE — Progress Notes (Signed)
NAME:  Jermaine [name], MRN:  [id number], DOB:  25-Sep-1965, LOS: 3 ADMISSION DATE:  01/14/2023, CONSULTATION DATE:  01/16/23 REFERRING MD:  TRH, CHIEF COMPLAINT:  ETOH withdrawal   History of Present Illness:  57 year old male with PMH of anxiety, depression, and ETOH abuse who presented 8/19 after reports of binge drinking for the last 2-3 months and decided to quit.  Last ETOH 8/19.  Admitted to [facility] in active ETOH withdrawal, ETOH 342 on admit, started on withdrawal protocol, however worsening agitation and secretions overnight 8/20, requiring 4L [place].  Was started on precedex but CIWAs remained 18-43.  Phenobarbital taper ordered by but not able to be given while NPO.  PCCM consulted for further evaluation.  Required intubation for respiratory secretions and airway protection.   Pertinent  Medical History   Past Medical History:  Diagnosis Date   Alcohol abuse    Anxiety    Class 1 obesity 09/14/2021   Depression     Significant Hospital Events: Including procedures, antibiotic start and stop dates in addition to other pertinent events   8/19 admitted ETOH withdrawal 8/21 PCCM consult, intubated  Interim History / Subjective:  Foley placed for urinary retention yest On NE overnight, currently on 4 mcg Tmax 101.7 Thick secretions with episode of mucous plug/ thick secretions and vent dyssynchrony, biting > ?holding his breath  Objective   Blood pressure 104/66, pulse 82, temperature 99.3 F (37.4 C), resp. rate 20, height 5\' 10"  (1.778 m), weight 95.7 kg, SpO2 98%.    Vent Mode: PRVC FiO2 (%):  [40 %-100 %] 40 % Set Rate:  [20 bmp] 20 bmp Vt Set:  [580 mL] 580 mL PEEP:  [5 cmH20] 5 cmH20 Plateau Pressure:  [19 cmH20-22 cmH20] 20 cmH20   Intake/Output Summary (Last 24 hours) at 01/17/2023 0724 Last data filed at 01/17/2023 1478 Gross per 24 hour  Intake 3011.28 ml  Output 1840 ml  Net 1171.28 ml   Filed Weights   01/14/23 1315 01/15/23 1033 01/17/23 0500  Weight: 92.1 kg  93.5 kg 95.7 kg   Examination: Propofol 35, fent 200 General:  critically ill adult male sedated on MV HEENT: MM pink/moist, ETT/ OGT, pupils pinpoint, anicteric Neuro: sedated, not open eyes or f/c, moves with suctioning/ stimulation CV: rr ir, no murmur PULM:  synchronous with MV, coarse with exp wheeze L > R, diminished in bases, thick tan/ brown secretions w/ some pinkish secretions in canister GI: soft, bs hyper, NT/ ND, brown stool noted on pad, foley with dk amber urine with sediment Extremities: warm/dry, no LE edema, delayed cap refill in feet Skin: no rashes   UOP 1.8L /24hrs Net +3.8 Labs> K 2.9, sCr 0.86> 1.05, phos 2.1, mag 2.5, wBC 8.2, plts 88> 100 CXR reviewed> stable from yesterday  Resolved Hospital Problem list    Assessment & Plan:   Delirium tremens  ETOH abuse - airway protection as below - PAD protocol as below> additional 130mg  of phenobarb this am and continue taper due to ongoing increased sedation needs on propofol and fentanyl.  Will add enteral oxy IR to minimize fentanyl.  - scheduled bowel regimen - daily thiamine/ folate/ MVI - seizure precautions - ETOH cessation counseling when appropriate    Acute hypoxic respiratory insufficiency  Aspiration pneumonia Mucous plugging Vent dyssynchrony  - vent dyssynchrony ongoing issue this am despite maximum sedation s/p vecuronium with normal PIP afterwards. Additional phenob as above, adding enteral sedation and PAD protocol ongoing.  Monitor secretions, may consider  bronch - still wheezing but no significant bronchospasm after paralytic, adding brovana and solumedrol 40mg  daily, cont albuterol scheduled and PRN.   unknown smoking hx - cont HTS nebs for 3 days, guaifenesin, CPT - follow trach asp, cont unasyn  - FiO2 as able for SpO2 >92% - daily SAT & SBT when appropriate, not a candidate today   Shock - suspected multifactorial 2/2 hypovolemia, sepsis and sedation - cont peripheral NE for MAP goal  > 65, remains low dose - 1L LR now, may need additional fluids  - strict I/Os - consider CVL if increasing pressor needs - check UA and send BC - tylenol prn  - trending WBC / fever curve    At risk for AKI Urinary retention- most likely related to heavy sedation - hemodynamic support as above - IVF bolus- urine more amber today with sediment - continue foley for now - trend renal indices  - strict I/Os, daily wts - avoid nephrotoxins, renal dose meds, hemodynamic support as above   Hypokalemia Hypophos - monitor for refeeding syndrome given hx of ETOH abuse - IV Kphos and KCL per tube   - trend phos BID starting TF - goal K> 4, Mag > 2   At risk for malnutrition - TF per RD.  Electrolytes as above - CBGs q 4 with prn SSI, goal 140-180  Best Practice (right click and "Reselect all SmartList Selections" daily)   Diet/type: tubefeeds DVT prophylaxis: LMWH GI prophylaxis: PPI Lines: N/A Foley:  Yes, and it is still needed Code Status:  full code Last date of multidisciplinary goals of care discussion [pta]  - pending update 8/22.  Mother and sister updated 8/21 by phone as they live in [place].  Pt has no family locally.   Labs   CBC: Recent Labs  Lab 01/14/23 0417 01/15/23 0401 01/16/23 0257  WBC 9.7 5.9 9.0  NEUTROABS 7.5  --  7.5  HGB 18.3* 15.0 14.6  HCT 52.8* 43.8 43.2  MCV 91.0 94.8 94.9  PLT 133* 111* 88*    Basic Metabolic Panel: Recent Labs  Lab 01/14/23 0417 01/15/23 0401 01/16/23 0257  NA 140 135 136  K 3.7 3.5 4.1  CL 100 99 102  CO2 25 29 24   GLUCOSE 103* 99 129*  BUN 22* 22* 13  CREATININE 1.11 1.01 0.86  CALCIUM 8.1* 7.9* 7.7*  MG  --   --  2.0  PHOS  --   --  2.4*   GFR: Estimated Creatinine Clearance: 110.1 mL/min (by C-G formula based on SCr of 0.86 mg/dL). Recent Labs  Lab 01/14/23 0417 01/15/23 0401 01/16/23 0257  WBC 9.7 5.9 9.0    Liver Function Tests: Recent Labs  Lab 01/14/23 0417 01/15/23 0401  01/16/23 0257  AST 51* 39 37  ALT 38 27 24  ALKPHOS 56 65 45  BILITOT 0.9 1.5* 0.8  PROT 7.3 6.0* 6.3*  ALBUMIN 4.3 3.4* 3.4*   Recent Labs  Lab 01/14/23 0417  LIPASE 57*   No results for input(s): "AMMONIA" in the last 168 hours.  ABG    Component Value Date/Time   PHART 7.44 01/16/2023 0900   PCO2ART 38 01/16/2023 0900   PO2ART 292 (H) 01/16/2023 0900   HCO3 25.8 01/16/2023 0900   O2SAT 99.8 01/16/2023 0900     Coagulation Profile: No results for input(s): "INR", "PROTIME" in the last 168 hours.  Cardiac Enzymes: No results for input(s): "CKTOTAL", "CKMB", "CKMBINDEX", "TROPONINI" in the last 168 hours.  HbA1C:  Hgb A1c MFr Bld  Date/Time Value Ref Range Status  01/16/2023 02:57 AM 5.4 4.8 - 5.6 % Final    Comment:    (NOTE) Pre diabetes:          5.7%-6.4%  Diabetes:              >6.4%  Glycemic control for   <7.0% adults with diabetes   03/20/2019 03:46 AM 5.4 4.8 - 5.6 % Final    Comment:    (NOTE) Pre diabetes:          5.7%-6.4% Diabetes:              >6.4% Glycemic control for   <7.0% adults with diabetes     CBG: Recent Labs  Lab 01/16/23 1254 01/16/23 1554 01/16/23 1954 01/16/23 2352 01/17/23 0341  GLUCAP 103* 93 106* 104* 105*    Critical care time: 35 mins     Posey Boyer, MSN, NP, AG-ACNP-BC [facility] 01/17/2023, 7:24 AM  See Amion for pager If no response to pager , please call [phone] until 7pm After 7:00 pm call Elink  [phone]

## 2023-01-17 NOTE — Plan of Care (Signed)
  Problem: Education: Goal: Knowledge of General Education information will improve Description: Including pain rating scale, medication(s)/side effects and non-pharmacologic comfort measures Outcome: Progressing   Problem: Health Behavior/Discharge Planning: Goal: Ability to manage health-related needs will improve Outcome: Progressing   Problem: Clinical Measurements: Goal: Ability to maintain clinical measurements within normal limits will improve Outcome: Progressing Goal: Will remain free from infection Outcome: Progressing Goal: Diagnostic test results will improve Outcome: Progressing Goal: Respiratory complications will improve Outcome: Progressing Goal: Cardiovascular complication will be avoided Outcome: Progressing   Problem: Activity: Goal: Risk for activity intolerance will decrease Outcome: Progressing   Problem: Nutrition: Goal: Adequate nutrition will be maintained Outcome: Progressing   Problem: Coping: Goal: Level of anxiety will decrease Outcome: Progressing   Problem: Elimination: Goal: Will not experience complications related to bowel motility Outcome: Progressing Goal: Will not experience complications related to urinary retention Outcome: Progressing   Problem: Pain Managment: Goal: General experience of comfort will improve Outcome: Progressing   Problem: Safety: Goal: Ability to remain free from injury will improve Outcome: Progressing   Problem: Skin Integrity: Goal: Risk for impaired skin integrity will decrease Outcome: Progressing   Problem: Safety: Goal: Non-violent Restraint(s) Outcome: Progressing   Problem: Activity: Goal: Ability to tolerate increased activity will improve Outcome: Progressing   Problem: Respiratory: Goal: Ability to maintain a clear airway and adequate ventilation will improve Outcome: Progressing   Problem: Role Relationship: Goal: Method of communication will improve Outcome: Progressing   

## 2023-01-17 NOTE — Procedures (Addendum)
Patient Name: Jermaine [name]  MRN: [id number]  Epilepsy Attending: Charlsie Quest  Referring Physician/Provider: Luciano Cutter, MD  Duration: 01/17/2023 1803 to 01/17/2023  2059  Patient history: 57yo Fpt having generalized tonic clonic movements with right sided decorticate posturing. EEG to evaluate for seizure.  Level of alertness: Awake, asleep-->comatose  AEDs during EEG study: Phenobarb, propofol, versed  Technical aspects: This EEG was obtained using a 10 lead EEG system positioned circumferentially without any parasagittal coverage (rapid EEG). Computer selected EEG is reviewed as  well as background features and all clinically significant events.  Description: EEG initially showed posterior dominant rhythm of 9 Hz activity of moderate voltage (25-35 uV) seen predominantly in posterior head regions, symmetric and reactive to eye opening and eye closing. Sleep was characterized by vertex waves, sleep spindles (12 to 14 Hz), maximal frontocentral region. Gradually as sedation was adjusted, EEG showed continuous generalized 3 to 6 Hz theta-delta slowing admixed with an excessive amount of 15 to 18 Hz beta activity distributed symmetrically and diffusely. Hyperventilation and photic stimulation were not performed.     IMPRESSION: This limited ceribell EEG was initially within normal limits. Gradually as sedation was adjusted, EEG was suggestive of severe diffuse encephalopathy, likely related to sedation. No seizures or epileptiform discharges were seen throughout the recording.  If suspicion for interictal activity remains a concern, a conventional EEG be considered.   Jermaine [name]

## 2023-01-17 NOTE — Care Management (Signed)
Transition of Care [facility]) - Inpatient Brief Assessment   Patient Details  Name: Jermaine [name] MRN: [id number] Date of Birth: [birth year]  Transition of Care [facility]) CM/SW Contact:    Lavenia Atlas, RN Phone Number: 01/17/2023, 4:04 PM   Clinical Narrative: Per chart review patient currently intubated (full vent support) at [facility] ICU for severe alcohol withdrawal. OG tube for tube feeds. Prior to being admitted to ICU patient declined SA resources. No current TOC needs.   TOC will continue to follow for discharge needs.   Transition of Care Asessment: Insurance and Status: Insurance coverage has been reviewed Patient has primary care physician: Yes Home environment has been reviewed: from home alone Prior level of function:: independent Prior/Current Home Services: No current home services Social Determinants of Health Reivew: SDOH reviewed no interventions necessary Readmission risk has been reviewed: Yes Transition of care needs: no transition of care needs at this time

## 2023-01-17 NOTE — Progress Notes (Addendum)
eLink Physician-Brief Progress Note Patient Name: Jermaine [name] DOB: 01-30-1966 MRN: [id number]   Date of Service  01/17/2023  HPI/Events of Note  Patient with ventilator dyssynchrony and wheezing likely related to sub-optimal sedation.  eICU Interventions  Fentanyl gtt ceiling increased to 300 mcg. CXR reviewed.        Migdalia Dk 01/17/2023, 5:16 AM

## 2023-01-17 NOTE — Progress Notes (Signed)
Pt's sister, Corrie Dandy, updated by phone.  Voices long hx of ETOH abuse, several bouts in rehab, and concerns of prior self harm as patient has been IVC'd before due to SI.  Wonders if pt started drinking again when his mother moved to [facility] and her health deteriorated 3 months ago.  Mother is currently hospitalized per    - if any deterioration, call sister as they might not extended/ heroic measures but continue full code as this should be reversible/ treatable process given no further complications.   - consider psych eval/ clearance after extubation to ensure this was not SI attempt given reported hx per sister.       Posey Boyer, MSN, NP, AG-ACNP-BC [facility] 01/17/2023, 2:32 PM  See Amion for pager If no response to pager , please call [phone] until 7pm After 7:00 pm call Elink  [phone]

## 2023-01-17 NOTE — Progress Notes (Signed)
MB received message from [facility] at [facility] after speaking to Dr. Otelia Limes, Pt will not be available for STAT EEG due to being an additional waiting time of 45 min.- 1 hour in CT/MRI exam. Neurology will place a [facility] order for EEG. SS will continue on to next patient and EEG will f/u as schedule permits if needed.

## 2023-01-18 ENCOUNTER — Inpatient Hospital Stay (HOSPITAL_COMMUNITY): Payer: BLUE CROSS/BLUE SHIELD

## 2023-01-18 DIAGNOSIS — I9589 Other hypotension: Secondary | ICD-10-CM | POA: Diagnosis not present

## 2023-01-18 DIAGNOSIS — R569 Unspecified convulsions: Secondary | ICD-10-CM | POA: Diagnosis not present

## 2023-01-18 DIAGNOSIS — F10931 Alcohol use, unspecified with withdrawal delirium: Secondary | ICD-10-CM | POA: Diagnosis not present

## 2023-01-18 DIAGNOSIS — J9611 Chronic respiratory failure with hypoxia: Secondary | ICD-10-CM | POA: Diagnosis not present

## 2023-01-18 LAB — CULTURE, RESPIRATORY W GRAM STAIN: Culture: NORMAL

## 2023-01-18 LAB — BLOOD GAS, ARTERIAL
Acid-base deficit: 1.1 mmol/L (ref 0.0–2.0)
Bicarbonate: 23.5 mmol/L (ref 20.0–28.0)
FIO2: 40 %
O2 Content: 40 L/min
O2 Saturation: 99.5 %
Patient temperature: 37
pCO2 arterial: 38 mmHg (ref 32–48)
pH, Arterial: 7.4 (ref 7.35–7.45)
pO2, Arterial: 96 mmHg (ref 83–108)

## 2023-01-18 LAB — ECHOCARDIOGRAM COMPLETE
Height: 70 in
S' Lateral: 4.1 cm
Weight: 3375.68 [oz_av]

## 2023-01-18 LAB — GLUCOSE, CAPILLARY
Glucose-Capillary: 105 mg/dL — ABNORMAL HIGH (ref 70–99)
Glucose-Capillary: 111 mg/dL — ABNORMAL HIGH (ref 70–99)
Glucose-Capillary: 123 mg/dL — ABNORMAL HIGH (ref 70–99)
Glucose-Capillary: 126 mg/dL — ABNORMAL HIGH (ref 70–99)
Glucose-Capillary: 136 mg/dL — ABNORMAL HIGH (ref 70–99)
Glucose-Capillary: 154 mg/dL — ABNORMAL HIGH (ref 70–99)
Glucose-Capillary: 91 mg/dL (ref 70–99)
Glucose-Capillary: 93 mg/dL (ref 70–99)

## 2023-01-18 LAB — BASIC METABOLIC PANEL
Anion gap: 5 (ref 5–15)
BUN: 10 mg/dL (ref 6–20)
CO2: 21 mmol/L — ABNORMAL LOW (ref 22–32)
Calcium: 7.7 mg/dL — ABNORMAL LOW (ref 8.9–10.3)
Chloride: 113 mmol/L — ABNORMAL HIGH (ref 98–111)
Creatinine, Ser: 0.84 mg/dL (ref 0.61–1.24)
GFR, Estimated: >60 mL/min (ref >60)
Glucose, Bld: 143 mg/dL — ABNORMAL HIGH (ref 70–99)
Potassium: 3.6 mmol/L (ref 3.5–5.1)
Sodium: 139 mmol/L (ref 135–145)

## 2023-01-18 LAB — MAGNESIUM
Magnesium: 2.6 mg/dL — ABNORMAL HIGH (ref 1.7–2.4)
Magnesium: 2.5 mg/dL — ABNORMAL HIGH (ref 1.7–2.4)

## 2023-01-18 LAB — CBC
HCT: 38.4 % — ABNORMAL LOW (ref 39.0–52.0)
Hemoglobin: 12.9 g/dL — ABNORMAL LOW (ref 13.0–17.0)
MCH: 32 pg (ref 26.0–34.0)
MCHC: 33.6 g/dL (ref 30.0–36.0)
MCV: 95.3 fL (ref 80.0–100.0)
Platelets: 97 10*3/uL — ABNORMAL LOW (ref 150–400)
RBC: 4.03 MIL/uL — ABNORMAL LOW (ref 4.22–5.81)
RDW: 14 % (ref 11.5–15.5)
WBC: 5.2 10*3/uL (ref 4.0–10.5)
nRBC: 0 % (ref 0.0–0.2)

## 2023-01-18 LAB — PHOSPHORUS
Phosphorus: 1.8 mg/dL — ABNORMAL LOW (ref 2.5–4.6)
Phosphorus: 3 mg/dL (ref 2.5–4.6)

## 2023-01-18 LAB — CK: Total CK: 318 U/L (ref 49–397)

## 2023-01-18 MED ORDER — MIDAZOLAM BOLUS VIA INFUSION
10.0000 mg | Freq: Once | INTRAVENOUS | Status: AC
  Administered 2023-01-18: 10 mg via INTRAVENOUS
  Filled 2023-01-18: qty 10

## 2023-01-18 MED ORDER — MIDAZOLAM-SODIUM CHLORIDE 100-0.9 MG/100ML-% IV SOLN
0.5000 mg/h | INTRAVENOUS | Status: DC
  Administered 2023-01-18: 10 mg/h via INTRAVENOUS
  Administered 2023-01-18: 20 mg/h via INTRAVENOUS
  Administered 2023-01-18: 10 mg/h via INTRAVENOUS
  Administered 2023-01-19: 20 mg/h via INTRAVENOUS
  Administered 2023-01-19: 18 mg/h via INTRAVENOUS
  Administered 2023-01-19: 20 mg/h via INTRAVENOUS
  Administered 2023-01-19: 15 mg/h via INTRAVENOUS
  Administered 2023-01-19: 20 mg/h via INTRAVENOUS
  Administered 2023-01-20: 12 mg/h via INTRAVENOUS
  Administered 2023-01-21: 9 mg/h via INTRAVENOUS
  Administered 2023-01-21: 10 mg/h via INTRAVENOUS
  Administered 2023-01-22 (×2): 9 mg/h via INTRAVENOUS
  Administered 2023-01-23: 6 mg/h via INTRAVENOUS
  Filled 2023-01-18 (×15): qty 100

## 2023-01-18 MED ORDER — MIDAZOLAM BOLUS VIA INFUSION
0.0000 mg | INTRAVENOUS | Status: DC | PRN
  Administered 2023-01-18: 4 mg via INTRAVENOUS

## 2023-01-18 MED ORDER — PERFLUTREN LIPID MICROSPHERE
1.0000 mL | INTRAVENOUS | Status: AC | PRN
  Administered 2023-01-18: 2 mL via INTRAVENOUS

## 2023-01-18 MED ORDER — POTASSIUM PHOSPHATES 15 MMOLE/5ML IV SOLN
30.0000 mmol | Freq: Once | INTRAVENOUS | Status: AC
  Administered 2023-01-18: 30 mmol via INTRAVENOUS
  Filled 2023-01-18: qty 10

## 2023-01-18 MED ORDER — PHENOBARBITAL SODIUM 65 MG/ML IJ SOLN: 32.5000 mg | Freq: Three times a day (TID) | INTRAMUSCULAR | Status: DC

## 2023-01-18 MED ORDER — ENOXAPARIN SODIUM 40 MG/0.4ML IJ SOSY
40.0000 mg | PREFILLED_SYRINGE | INTRAMUSCULAR | Status: DC
  Administered 2023-01-18 – 2023-01-28 (×10): 40 mg via SUBCUTANEOUS
  Filled 2023-01-18 (×10): qty 0.4

## 2023-01-18 MED ORDER — PHENOBARBITAL SODIUM 65 MG/ML IJ SOLN
65.0000 mg | Freq: Three times a day (TID) | INTRAMUSCULAR | Status: DC
  Administered 2023-01-18 – 2023-01-19 (×3): 65 mg via INTRAVENOUS
  Filled 2023-01-18 (×2): qty 1

## 2023-01-18 NOTE — Progress Notes (Signed)
[facility] ADULT ICU REPLACEMENT PROTOCOL   The patient does apply for the [facility] Adult ICU Electrolyte Replacment Protocol based on the criteria listed below:   1.Exclusion criteria: TCTS, ECMO, Dialysis, and Myasthenia Gravis patients 2. Is GFR >/= 30 ml/min? Yes.    Patient's GFR today is >60 3. Is SCr </= 2? Yes.   Patient's SCr is 0.84 mg/dL 4. Did SCr increase >/= 0.5 in 24 hours? No. 5.Pt's weight >40kg  Yes.   6. Abnormal electrolyte(s): phos 1.8, potassium 3.6  7. Electrolytes replaced per protocol 8.  Call MD STAT for K+ </= 2.5, Phos </= 1, or Mag </= 1 Physician:  Dr. Matt Holmes 01/18/2023 3:51 AM

## 2023-01-18 NOTE — Progress Notes (Signed)
Nutrition Follow-up  DOCUMENTATION CODES:   Not applicable  INTERVENTION:   Tube feeding via OG tube: - Resume Vital AF 1.2 @ 20 ml/hr and advance rate by 10 ml every 12 hours to goal rate of 65 ml/hr (1560 ml/day)  Tube feeding regimen at goal rate provides 1872 kcal, 117 grams of protein, and 1265 ml of H2O.   Continue to monitor magnesium, potassium, and phosphorus every 12 hours for at least 4 additional occurrences as tube feeds are being advance to goal rate. MD to replete as needed as pt appears to be actively refeeding with hypophosphatemia on trickle feeds.  - Continue MVI with minerals, folic acid, and thiamine daily per tube  NUTRITION DIAGNOSIS:   Inadequate oral intake related to inability to eat as evidenced by NPO status.  Ongoing, being addressed via TF  GOAL:   Patient will meet greater than or equal to 90% of their needs  Met via TF at goal rate  MONITOR:   Vent status, Labs, Weight trends, TF tolerance, I & O's  REASON FOR ASSESSMENT:   Consult Enteral/tube feeding initiation and management  ASSESSMENT:   57 y.o. male with PMHx including anxiety, depression, EtOH abuse who was admitted for alcohol withdrawal.  8/21 - tube feeds started 8/22 - tube feeds kept at trickle rate 8/23 - transfer to [facility] for LTM, begin advancing TF to goal  Discussed pt with RN and during ICU rounds. Pt transferred to [facility]M this morning from [facility] ICU. Tube feeds had been turned off for transfer. RN preparing to resume tube feeds via OG tube. Plan to resume tube feeds at trickle rate given phosphorus of 1.8. Replacement for low phosphorus has been ordered. Will plan to keep rate at 20 ml/hr for 12 hours; then will advance rate to 30 ml/hr and continue to advance q 12 hours to goal.  Weight up 20 lbs this admission. Pt is net positive 5.7 L. Weight up 6 kg after transfer from [facility] to [facility]; suspect this difference is due to difference in bed scale.  Admit  weight: 92.1 kg Current weight: 101.8 kg  Current TF: Vital AF 1.2 @ 20 ml/hr  Patient is currently intubated on ventilator support Temp (24hrs), Avg:98 F (36.7 C), Min:97.2 F (36.2 C), Max:98.6 F (37 C)  Drips: Propofol: 30 mcg/kg/min (provides 444 kcal daily from lipid) Fentanyl Versed Levophed: off  Medications reviewed and include: colace, folic acid, IV solu-medrol, MVI with minerals, IV protonix, IV phenobarbital, miralax, IV thiamine, IV abx, IV potassium phosphate 30 mmol x 1  Labs reviewed: chloride 113, phosphorus 1.8, magnesium 2.6, platelets 97 CBG's: 91-185 x 24 hours  UOP: 4250 ml x 24 hours I/O's: +5.7 L since admit  Diet Order:   Diet Order     None       EDUCATION NEEDS:   Not appropriate for education at this time  Skin:  Skin Assessment: Reviewed RN Assessment  Last BM:  01/18/23 large type 6  Height:   Ht Readings from Last 1 Encounters:  01/15/23 5\' 10"  (1.778 m)    Weight:   Wt Readings from Last 1 Encounters:  01/18/23 101.8 kg    Ideal Body Weight:  75.5 kg  BMI:  Body mass index is 32.2 kg/m.  Estimated Nutritional Needs:   Kcal:  1900-2100  Protein:  110-130 grams  Fluid:  >/= 2L    Mertie Clause, MS, RD, LDN Registered Dietitian II Please see AMiON for contact information.

## 2023-01-18 NOTE — Progress Notes (Signed)
  Echocardiogram 2D Echocardiogram has been performed.  Milda Smart 01/18/2023, 9:23 AM

## 2023-01-18 NOTE — Consult Note (Signed)
NEURO HOSPITALIST CONSULT NOTE   Requestig physician: Dr. Everardo All  Reason for Consult: Inpatient seizure-like episodes with posturing in a patient who initially presented on 8/19 with alcohol withdrawal symptoms  History obtained from:  CCM and Chart     HPI:                                                                                                                                          Dequann Schleeter is a 57 y.o. male with a past medical history of ETOH abuse, anxiety and depression who was admitted to [facility] on 8/19 for alcohol withdrawal. Per chart review, for the last few months he has been binge drinking and recently began to get nauseous with vomiting. He stated that he wanted to stop drinking and was "sick of it".  ETOH level on admit was 342. He was noted to be tremulous, very shaky, tachycardic and diaphoretic in the emergency room.  Last drink was on 8/19, the day he presented.  On 8/20 he had worsening agitation and DTs with hallucinations requiring frequent Ativan. He also had increased secretions requiring oxygen overnight 8/20, requiring 4L [place]. He was started on precedex but CIWAs remained in the 18 - 43 range. Phenobarbital taper was ordered but was not able to be given while NPO. He was diagnosed with aspiration pneumonia at time of CCM consult in the morning on 8/21. He was noted at that time to be unable to maintain his airway and was subsequently intubated. Precedex was changed to propofol and IV phenobarbital taper was started.   On Thursday afternoon, CCM NP was called to the bedside for generalized seizure-like activity. On her exam, the patient was having  generalized tonic clonic movements with right sided decorticate posturing. STAT CT head was negative, as was follow up MRI brain. Ceribell EEG was normal. He was loaded with 3 g of IV Keppra with maintenance dose of 1 g twice daily. He was again noted this morning to have 2 episodes of arm posturing and  seizure-like activity and was placed back on ceribell EEG. He was then transferred to the ICU at [facility] for LTM EEG monitoring.  Labs: Ammonia normal  CK 440-> 318 Phos 1.8 Mg 2.6   On exam today he is on 100  mcg of fentanyl IV, 30 mcg of propofol, 10 mg of Versed IV, and norepinephrine. He is currently on ceribell  EEG. His eyes are closed, does not open eyes to stimuli.he does not follow commands.  Pupils are small, equal and reactive.  Cough and gag present. Corneal reflex intact bilaterally.  He is moving all extremities spontaneously, and withdraws to pain in all 4 extremities. RN at the bedside and noted what she thought was some posturing and tremor-like activity in  his right arm while Neurology NP was in the room examining him, however patient was noted by Neurology NP at that time to be agitated with associated semipurposeful movements, fighting against restraints and not adequately sedated.  It did not appear to be posturing or seizure like activity, per Neurology NP.    Past Medical History:  Diagnosis Date   Alcohol abuse    Anxiety    Class 1 obesity 09/14/2021   Depression     Past Surgical History:  Procedure Laterality Date   HERNIA REPAIR      Family History  Adopted: Yes              Social History:  reports that he has never smoked. He has never used smokeless tobacco. He reports current alcohol use of about 20.0 standard drinks of alcohol per week. He reports that he does not currently use drugs.  No Known Allergies  MEDICATIONS:                                                                                                                      Current Facility-Administered Medications:    acetaminophen (TYLENOL) 160 MG/5ML solution 650 mg, 650 mg, Per Tube, Q6H PRN, Selmer Dominion B, NP, 650 mg at 01/17/23 0950   albuterol (PROVENTIL) (2.5 MG/3ML) 0.083% nebulizer solution 2.5 mg, 2.5 mg, Nebulization, Q2H PRN, Luciano Cutter, MD, 2.5 mg at 01/18/23  1202   Ampicillin-Sulbactam (UNASYN) 3 g in sodium chloride 0.9 % 100 mL IVPB, 3 g, Intravenous, Q6H, Cherylin Mylar, RPH, Stopped at 01/18/23 1459   arformoterol (BROVANA) nebulizer solution 15 mcg, 15 mcg, Nebulization, BID, Selmer Dominion B, NP, 15 mcg at 01/18/23 1909   budesonide (PULMICORT) nebulizer solution 0.5 mg, 0.5 mg, Nebulization, BID, Selmer Dominion B, NP, 0.5 mg at 01/18/23 1909   Chlorhexidine Gluconate Cloth 2 % PADS 6 each, 6 each, Topical, Daily, Dorcas Carrow, MD, 6 each at 01/18/23 1130   docusate (COLACE) 50 MG/5ML liquid 100 mg, 100 mg, Per Tube, BID, Selmer Dominion B, NP, 100 mg at 01/18/23 1149   enoxaparin (LOVENOX) injection 40 mg, 40 mg, Subcutaneous, Q24H, Simpson, Paula B, NP, 40 mg at 01/18/23 1208   feeding supplement (VITAL AF 1.2 CAL) liquid 1,000 mL, 1,000 mL, Per Tube, Continuous, Selmer Dominion B, NP, Last Rate: 20 mL/hr at 01/18/23 1800, Infusion Verify at 01/18/23 1800   fentaNYL (SUBLIMAZE) bolus via infusion 50-100 mcg, 50-100 mcg, Intravenous, Q15 min PRN, Selmer Dominion B, NP, 100 mcg at 01/18/23 1823   fentaNYL in NS (23mcg/ml) infusion-PREMIX, 50-300 mcg/hr, Intravenous, Continuous, Ogan, Okoronkwo U, MD, Last Rate: 30 mL/hr at 01/18/23 1813, 300 mcg/hr at 01/18/23 1813   folic acid (FOLVITE) tablet 1 mg, 1 mg, Per Tube, Daily, Selmer Dominion B, NP, 1 mg at 01/18/23 1149   guaiFENesin (ROBITUSSIN) 100 MG/5ML liquid 15 mL, 15 mL, Per Tube, Q4H, Simpson, Paula B, NP, 15 mL at 01/18/23 1546   levETIRAcetam (KEPPRA) IVPB 1000 mg/100  mL premix, 1,000 mg, Intravenous, Q12H, Luciano Cutter, MD, Last Rate: 400 mL/hr at 01/18/23 1809, 1,000 mg at 01/18/23 1809   methylPREDNISolone sodium succinate (SOLU-MEDROL) 40 mg/mL injection 40 mg, 40 mg, Intravenous, Daily, Selmer Dominion B, NP, 40 mg at 01/18/23 1201   midazolam (VERSED) 100 mg/100 mL (1 mg/mL) premix infusion, 20 mg/hr, Intravenous, Continuous, Caryl Pina, MD, Last Rate: 20 mL/hr at  01/18/23 1800, 20 mg/hr at 01/18/23 1800   midazolam (VERSED) bolus via infusion 0-5 mg, 0-5 mg, Intravenous, Q1H PRN, Selmer Dominion B, NP, 4 mg at 01/18/23 2841   multivitamin with minerals tablet 1 tablet, 1 tablet, Per Tube, Daily, Selmer Dominion B, NP, 1 tablet at 01/18/23 1149   norepinephrine (LEVOPHED) 4mg  in (0.016 mg/mL) premix infusion, 1-10 mcg/min, Intravenous, Titrated, Luciano Cutter, MD, Stopped at 01/18/23 1701   Oral care mouth rinse, 15 mL, Mouth Rinse, Q2H, Luciano Cutter, MD, 15 mL at 01/18/23 1808   Oral care mouth rinse, 15 mL, Mouth Rinse, PRN, Luciano Cutter, MD   oxyCODONE (Oxy IR/ROXICODONE) immediate release tablet 10 mg, 10 mg, Per Tube, Q6H, Selmer Dominion B, NP, 10 mg at 01/18/23 1808   pantoprazole (PROTONIX) injection 40 mg, 40 mg, Intravenous, Daily, Selmer Dominion B, NP, 40 mg at 01/18/23 1156   [COMPLETED] PHENObarbital (LUMINAL) injection 97.5 mg, 97.5 mg, Intravenous, Q8H, 97.5 mg at 01/18/23 0153 **FOLLOWED BY** PHENObarbital (LUMINAL) injection 65 mg, 65 mg, Intravenous, Q8H, 65 mg at 01/18/23 1221 **FOLLOWED BY** [START ON 01/20/2023] PHENObarbital (LUMINAL) injection 32.5 mg, 32.5 mg, Intravenous, Q8H, Mitchell, Madeline I, RPH   polyethylene glycol (MIRALAX / GLYCOLAX) packet 17 g, 17 g, Per Tube, Daily, Selmer Dominion B, NP, 17 g at 01/17/23 0932   propofol (DIPRIVAN) 1000 MG/100ML infusion, 5-50 mcg/kg/min, Intravenous, Continuous, Selmer Dominion B, NP, Last Rate: 5.61 mL/hr at 01/18/23 1800, 10 mcg/kg/min at 01/18/23 1800   sodium chloride HYPERTONIC 3 % nebulizer solution 4 mL, 4 mL, Nebulization, BID, Selmer Dominion B, NP, 4 mL at 01/18/23 3244   [DISCONTINUED] thiamine (VITAMIN B1) tablet 100 mg, 100 mg, Oral, Daily, 100 mg at 01/15/23 1032 **OR** thiamine (VITAMIN B1) injection 100 mg, 100 mg, Intravenous, Daily, Rancour, Stephen, MD, 100 mg at 01/18/23 1203 .   ROS:                                                                                                                                        ROS unobtainable from patient due to mental status   Blood pressure 112/69, pulse 60, temperature (!) 97.5 F (36.4 C), resp. rate 19, height 5\' 10"  (1.778 m), weight 95.7 kg, SpO2 99%.   General Examination:  Physical Exam  HEENT:  Intubated and on sedation, critically ill Lungs: Respirations unlabored Extremities: Warm and well perfused.   Neurological Examination Mental Status: Intubated and on 100  mcg of fentanyl IV, 30 mcg of propofol, 10 mg of Versed IV, and norepinephrine. eyes are closed, does not open eyes to stimuli. He does not follow commands.  Pupils are small equal and reactive.  Cough and gag present.  Corneal reflex intact bilaterally.   Cranial Nerves: II: Visual fields tested with no blink to threat bilaterally  III,IV, VI: No ptosis. EOMI. No nystagmus.  V, VII: Corneal reflex intact bilaterally  VIII: Unable to assess IX,X: Unable to assess  XI: unable to assess XII: unable to assess  Motor/Sensory: Withdraws to painful stimuli, moving all 4 extremities spontaneously and antigravity  No asymmetry in the context of spontaneous movements.  Unable to formally assess strength in the context of sedation.  Cerebellar/Gait: Unable to assess     Lab Results: Basic Metabolic Panel: Recent Labs  Lab 01/15/23 0401 01/16/23 0257 01/17/23 0715 01/17/23 1757 01/18/23 0300  NA 135 136 135 137 139  K 3.5 4.1 2.9* 4.5 3.6  CL 99 102 103 105 113*  CO2 29 24 23 25  21*  GLUCOSE 99 129* 120* 173* 143*  BUN 22* 13 15 11 10   CREATININE 1.01 0.86 1.05 0.97 0.84  CALCIUM 7.9* 7.7* 7.3* 7.6* 7.7*  MG  --  2.0 2.5* 2.7* 2.6*  PHOS  --  2.4* 2.1* 3.0 1.8*    CBC: Recent Labs  Lab 01/14/23 0417 01/15/23 0401 01/16/23 0257 01/17/23 0715 01/17/23 1757 01/18/23 0300  WBC 9.7 5.9 9.0 8.2 4.5 5.2  NEUTROABS  7.5  --  7.5  --   --   --   HGB 18.3* 15.0 14.6 13.6 14.0 12.9*  HCT 52.8* 43.8 43.2 41.1 42.3 38.4*  MCV 91.0 94.8 94.9 95.8 98.1 95.3  PLT 133* 111* 88* 100* 101* 97*    Cardiac Enzymes: Recent Labs  Lab 01/17/23 1757 01/18/23 0257  CKTOTAL 440* 318    Lipid Panel: Recent Labs  Lab 01/17/23 0715  TRIG 100    Imaging: Rapid EEG  Result Date: 01/17/2023 Charlsie Quest, MD     01/17/2023  7:56 PM Patient Name: Izaak Arms MRN: [id number] Epilepsy Attending: Charlsie Quest Referring Physician/Provider: Luciano Cutter, MD Duration: 2 hours Patient history: 57yo Fpt having generalized tonic clonic movements with right sided decorticate posturing. EEG to evaluate for seizure. Level of alertness: Awake, asleep AEDs during EEG study: Phenobarb Technical aspects: This EEG was obtained using a 10 lead EEG system positioned circumferentially without any parasagittal coverage (rapid EEG). Computer selected EEG is reviewed as  well as background features and all clinically significant events. Description: The posterior dominant rhythm consists of 9 Hz activity of moderate voltage (25-35 uV) seen predominantly in posterior head regions, symmetric and reactive to eye opening and eye closing. Sleep was characterized by vertex waves, sleep spindles (12 to 14 Hz), maximal frontocentral region. Hyperventilation and photic stimulation were not performed.   IMPRESSION: This limited ceribell EEG is within normal limits. No seizures or epileptiform discharges were seen throughout the recording. If suspicion for interictal activity remains a concern, a conventional EEG be considered. Charlsie Quest   MR BRAIN W WO CONTRAST  Result Date: 01/17/2023 CLINICAL DATA:  Seizure, new-onset, no history of trauma unknown hx pta EXAM: MRI HEAD WITHOUT AND WITH CONTRAST TECHNIQUE: Multiplanar, multiecho pulse sequences of the brain  and surrounding structures were obtained without and with intravenous contrast.  CONTRAST:  9mL GADAVIST GADOBUTROL 1 MMOL/ML IV SOLN COMPARISON:  CT head from today. FINDINGS: Brain: No acute infarction, hemorrhage, hydrocephalus, extra-axial collection or mass lesion. Mild scattered small T2/FLAIR hyperintensities the white matter, nonspecific but considered within normal limits for patient age. Unremarkable hippocampi. No pathologic enhancement. Vascular: Major arterial flow voids are maintained skull base. Skull and upper cervical spine: Normal marrow signal. Sinuses/Orbits: Mild paranasal sinus mucosal thickening. No acute orbital findings. Other: Trace bilateral mastoid effusions. IMPRESSION: Unremarkable brain MRI.  No evidence of acute abnormality. Electronically Signed   By: Feliberto Harts M.D.   On: 01/17/2023 18:03   CT HEAD WO CONTRAST ( )  Result Date: 01/17/2023 CLINICAL DATA:  Seizure, new-onset, no history of trauma R sided decorticate posturing with seizure like activity; Neck trauma, intoxicated or obtunded (Age >= 16y) possible fall prior to arrival. EXAM: CT HEAD WITHOUT CONTRAST CT CERVICAL SPINE WITHOUT CONTRAST TECHNIQUE: Multidetector CT imaging of the head and cervical spine was performed following the standard protocol without intravenous contrast. Multiplanar CT image reconstructions of the cervical spine were also generated. RADIATION DOSE REDUCTION: This exam was performed according to the departmental dose-optimization program which includes automated exposure control, adjustment of the mA and/or kV according to patient size and/or use of iterative reconstruction technique. COMPARISON:  None Available. FINDINGS: CT HEAD FINDINGS Brain: No acute intracranial hemorrhage. Gray-white differentiation is preserved. No hydrocephalus or extra-axial collection. No mass effect or midline shift. Vascular: No hyperdense vessel or unexpected calcification. Skull: No calvarial fracture or suspicious bone lesion. Skull base is unremarkable. Sinuses/Orbits: No acute  finding. Other: None. CT CERVICAL SPINE FINDINGS Alignment: Normal. Skull base and vertebrae: No acute fracture. Normal craniocervical junction. No suspicious bone lesions. Soft tissues and spinal canal: No prevertebral fluid or swelling. No visible canal hematoma. Disc levels:  No significant degenerative change. Upper chest: No acute findings. Other: None. IMPRESSION: 1. No acute intracranial abnormality. 2. No acute cervical spine fracture or traumatic listhesis. Electronically Signed   By: Orvan Falconer M.D.   On: 01/17/2023 17:08   CT CERVICAL SPINE WO CONTRAST  Result Date: 01/17/2023 CLINICAL DATA:  Seizure, new-onset, no history of trauma R sided decorticate posturing with seizure like activity; Neck trauma, intoxicated or obtunded (Age >= 16y) possible fall prior to arrival. EXAM: CT HEAD WITHOUT CONTRAST CT CERVICAL SPINE WITHOUT CONTRAST TECHNIQUE: Multidetector CT imaging of the head and cervical spine was performed following the standard protocol without intravenous contrast. Multiplanar CT image reconstructions of the cervical spine were also generated. RADIATION DOSE REDUCTION: This exam was performed according to the departmental dose-optimization program which includes automated exposure control, adjustment of the mA and/or kV according to patient size and/or use of iterative reconstruction technique. COMPARISON:  None Available. FINDINGS: CT HEAD FINDINGS Brain: No acute intracranial hemorrhage. Gray-white differentiation is preserved. No hydrocephalus or extra-axial collection. No mass effect or midline shift. Vascular: No hyperdense vessel or unexpected calcification. Skull: No calvarial fracture or suspicious bone lesion. Skull base is unremarkable. Sinuses/Orbits: No acute finding. Other: None. CT CERVICAL SPINE FINDINGS Alignment: Normal. Skull base and vertebrae: No acute fracture. Normal craniocervical junction. No suspicious bone lesions. Soft tissues and spinal canal: No prevertebral  fluid or swelling. No visible canal hematoma. Disc levels:  No significant degenerative change. Upper chest: No acute findings. Other: None. IMPRESSION: 1. No acute intracranial abnormality. 2. No acute cervical spine fracture or traumatic listhesis. Electronically Signed   By: Zollie Beckers  Wiggins M.D.   On: 01/17/2023 17:08   DG Chest Port 1 View  Result Date: 01/17/2023 CLINICAL DATA:  Respiratory failure EXAM: PORTABLE CHEST 1 VIEW COMPARISON:  CXR 01/16/23 FINDINGS: Endotracheal tube terminates below the thoracic inlet, likely in the midtrachea. Enteric tube courses below diaphragm with the tip out of the field of view. No pleural effusion. No pneumothorax. Low lung volumes. Compared to prior exam there appears to be increased mediastinal widening. This may be partially due to low lung volumes possibly venous congestion, but further evaluation with a chest CT is recommended for more definitive characterization. No radiographically apparent displaced rib fractures. Visualized upper abdomen is unremarkable. IMPRESSION: Compared to prior exam there appears to be increased mediastinal widening. This may partially the due to low lung volumes and venous congestion, but further evaluation with a chest CT is recommended for more definitive characterization. Electronically Signed   By: Lorenza Cambridge M.D.   On: 01/17/2023 09:24   [place] EKG SITE RITE  Result Date: 01/17/2023 If [facility] image not attached, placement could not be confirmed due to current cardiac rhythm.    Assessment: Inpatient seizure-like episodes with posturing in a patient who initially presented on 8/19 with alcohol withdrawal symptoms - Exam reveals a sedated and intubated patient who remains agitated despite high levels of sedating IV medications.  - CT head and neck: . No acute intracranial abnormality. 2. No acute cervical spine fracture or traumatic listhesis.  - MRI brain: Unremarkable brain MRI. No evidence of acute abnormality.  -  EEGs: - Initial Ceribell rapid EEG (8/22): This limited ceribell EEG was initially within normal limits. Gradually as sedation was adjusted, EEG was suggestive of severe diffuse encephalopathy, likely related to sedation. No seizures or epileptiform discharges were seen throughout the recording.  - Subsequent Ceribell rapid EEG (8/23): This limited ceribell EEG was suggestive of severe diffuse encephalopathy, likely related to sedation. No seizures or epileptiform discharges were seen throughout the recording. - Due to initial suspicion for uncontrolled recurrent seizure activity, he was loaded with 3 g of IV Keppra, with maintenance dose of 1 g twice daily. Will most likely discontinue this medication while on LTM EEG and titrate Versed to agitation and possible electrographic seizure activity.     Recommendations: - Inpatient seizure precautions  - LTM EEG  - Continue Keppra 1000 mg IV BID for now - Continue phenobarb taper - Wean off propofol  - Start Versed gtt and increase as needed  - Continue Fentanyl gtt - Neurology will continue to follow   40 minutes spent in the neurological evaluation and management of this critically ill patient  Electronically signed: Dr. Caryl Pina 01/18/2023, 9:32 AM

## 2023-01-18 NOTE — Procedures (Signed)
Patient Name: Jermaine [name]  MRN: [id number]  Epilepsy Attending: Charlsie Quest  Referring Physician/Provider: Caryl Pina, MD  Duration: 44.58 mins   Patient history: 57yo Fpt having generalized tonic clonic movements with right sided decorticate posturing. EEG to evaluate for seizure.   Level of alertness: comatose   AEDs during EEG study: Phenobarb, propofol, versed   Technical aspects: This EEG was obtained using a 10 lead EEG system positioned circumferentially without any parasagittal coverage (rapid EEG). Computer selected EEG is reviewed as  well as background features and all clinically significant events.   Description: EEG showed continuous generalized 3 to 6 Hz theta-delta slowing admixed with an excessive amount of 15 to 18 Hz beta activity distributed symmetrically and diffusely. Hyperventilation and photic stimulation were not performed.      IMPRESSION: This limited ceribell EEG was suggestive of severe diffuse encephalopathy, likely related to sedation. No seizures or epileptiform discharges were seen throughout the recording.   If suspicion for interictal activity remains a concern, a conventional EEG be considered.    Jermaine [name] [name]

## 2023-01-18 NOTE — Progress Notes (Signed)
STAT LTM EEG running. Non MRI leads. [facility] monitoring. Test button tested.

## 2023-01-18 NOTE — Plan of Care (Signed)
  Problem: Education: Goal: Knowledge of General Education information will improve Description: Including pain rating scale, medication(s)/side effects and non-pharmacologic comfort measures Outcome: Progressing   Problem: Health Behavior/Discharge Planning: Goal: Ability to manage health-related needs will improve Outcome: Progressing   Problem: Clinical Measurements: Goal: Ability to maintain clinical measurements within normal limits will improve Outcome: Progressing Goal: Will remain free from infection Outcome: Progressing Goal: Diagnostic test results will improve Outcome: Progressing Goal: Respiratory complications will improve Outcome: Progressing Goal: Cardiovascular complication will be avoided Outcome: Progressing   Problem: Activity: Goal: Risk for activity intolerance will decrease Outcome: Progressing   Problem: Nutrition: Goal: Adequate nutrition will be maintained Outcome: Progressing   Problem: Coping: Goal: Level of anxiety will decrease Outcome: Progressing   Problem: Elimination: Goal: Will not experience complications related to bowel motility Outcome: Progressing Goal: Will not experience complications related to urinary retention Outcome: Progressing   Problem: Pain Managment: Goal: General experience of comfort will improve Outcome: Progressing   Problem: Safety: Goal: Ability to remain free from injury will improve Outcome: Progressing   Problem: Skin Integrity: Goal: Risk for impaired skin integrity will decrease Outcome: Progressing   Problem: Safety: Goal: Non-violent Restraint(s) Outcome: Progressing   Problem: Activity: Goal: Ability to tolerate increased activity will improve Outcome: Progressing   Problem: Respiratory: Goal: Ability to maintain a clear airway and adequate ventilation will improve Outcome: Progressing   Problem: Role Relationship: Goal: Method of communication will improve Outcome: Progressing   

## 2023-01-18 NOTE — Progress Notes (Addendum)
NAME:  Jermaine [name], MRN:  [id number], DOB:  1966-01-27, LOS: 4 ADMISSION DATE:  01/14/2023, CONSULTATION DATE:  01/16/23 REFERRING MD:  TRH, CHIEF COMPLAINT:  ETOH withdrawal   History of Present Illness:  57 year old male with PMH of anxiety, depression, and ETOH abuse who presented 8/19 after reports of binge drinking for the last 2-3 months and decided to quit.  Last ETOH 8/19.  Admitted to [facility] in active ETOH withdrawal, ETOH 342 on admit, started on withdrawal protocol, however worsening agitation and secretions overnight 8/20, requiring 4L [place].  Was started on precedex but CIWAs remained 18-43.  Phenobarbital taper ordered by but not able to be given while NPO.  PCCM consulted for further evaluation.  Required intubation for respiratory secretions and airway protection.   Pertinent  Medical History   Past Medical History:  Diagnosis Date   Alcohol abuse    Anxiety    Class 1 obesity 09/14/2021   Depression     Significant Hospital Events: Including procedures, antibiotic start and stop dates in addition to other pertinent events   8/19 admitted ETOH withdrawal 8/21 PCCM consult, intubated, fever, abx for aspiration 8/22 mucous plugging, seizure like activity, CTH/ MRI brain, ceribell neg  Interim History / Subjective:  CTH, CT cervical, and MRI brain neg Afebrile overnight Seizure like activity again this am x 2, abates after versed.  Ceribell placed back on Pending tx to [facility] for LTM  Objective   Blood pressure 112/69, pulse 60, temperature (!) 97.5 F (36.4 C), resp. rate 19, height 5\' 10"  (1.778 m), weight 95.7 kg, SpO2 99%.    Vent Mode: PRVC FiO2 (%):  [40 %] 40 % Set Rate:  [20 bmp] 20 bmp Vt Set:  [580 mL] 580 mL PEEP:  [5 cmH20] 5 cmH20 Plateau Pressure:  [17 cmH20-21 cmH20] 20 cmH20   Intake/Output Summary (Last 24 hours) at 01/18/2023 0932 Last data filed at 01/18/2023 4098 Gross per 24 hour  Intake 5872.89 ml  Output 4250 ml  Net 1622.89 ml   Filed Weights    01/15/23 1033 01/17/23 0500 01/18/23 0500  Weight: 93.5 kg 95.7 kg 95.7 kg   Examination: Propofol 50, fent 300 General:  critically ill male sedated on MV HEENT: MM pink/moist, ETT/ OGT, pupils 2/ sluggish Neuro: sedated CV: rr, NSR, no murmur PULM:  coarse R> L, minimal thick secretions, no wheeze, normal PIP GI: soft, bs+, foley- tea colored   Extremities: warm/dry, generalized +1 edema  Skin: skin appears more red localized around chest/ neck but no distinct rash  UOP 4.28L /24hrs Net +4.9L Labs/ micro reviewed > K 3.6, bicarb 21, sCr 0.84, phos 1.8, Mag 2.6, CK 440> 318, Hgb 12.9, WBC 5.2, Plt 101>97 Micro reviewed> ngtd    Resolved Hospital Problem list    Assessment & Plan:   Delirium tremens with suspected seizures  ETOH abuse - pending transfer to [facility] for LTM.  On ceribell currently.  Was not on when he had his two episodes this am.  - appreciate Neurology assistance/ input - AEDs per neurology > keppra - switch propofol over to versed gtt - cont phenobarb taper - CTH/ MRI brain imaging neg 8/22 - ammonia neg, CK normalized this am - seizure precautions - daily thiamine/ folate/ MVI - Maintain neuro protective measures; goal for eurothermia, euglycemia, eunatermia, normoxia, and PCO2 goal of 35-40 - Nutrition and bowel regiment  - CBG's q 4 with SSI prn for goal 140-180   Acute hypoxic respiratory insufficiency  Aspiration pneumonia Mucous plugging, improved  Vent dyssynchrony  - cont full MV support, 4-8cc/kg IBW with goal Pplat <30 and DP<15  - VAP prevention protocol/ PPI - PAD protocol for sedation> versed, fentanyl, oxy IR enteral - intermittent CXR - check VBG - wean FiO2 as able for SpO2 >92% - daily SAT & SBT> not current appropriate  - day 2 of HTS, cont mucinex, albuterol - solumedrol 40mg  daily, day 2> no further wheezing noted today - cont BD> albuterol prn, brovana, pulmicort> holding on LAMA with mucous plugging - follow trach asp -  cont unasyn    Shock, improved  - suspected multifactorial 2/2 hypovolemia, sepsis and sedation P:  -  off peripheral NE till this morning after episode, remains low dose, goal > 65 - likely will need CVL as PIV access becoming limited, sister has consented - follow cultures - cont unasyn for now - good UOP - tylenol prn  - trending WBC / fever curve    At risk for AKI Urinary retention- most likely related to heavy sedation - hemodynamic support as above - urine with less sediment but remains tea colored, good UOP, sCr remains normal - mod Hgb, > 50 RBC on UA> CT a/p noted non obstructing small 2mm right renal stone, no hydro   - continue foley for now - trend renal indices  - strict I/Os, daily wts - avoid nephrotoxins, renal dose meds, hemodynamic support as above   Hypokalemia Hypophos - monitor closely for refeeding syndrome given hx of ETOH abuse - s/p IV Kphos replete - optimize electrolytes  - goal K> 4, Mag > 2   Chronic thrombocytopenia - likely from ETOH abuse - plts tend stable - lovenox held 8/22 given concern for hematuria> better today - Hgb 14> 12.9, could be some component of dilution - restart lovenox today and monitor for bleeding.  Normal INR   At risk for malnutrition - TF per RD.  Electrolytes as above - CBGs q 4 with prn SSI, goal 140-180   Possible hx of falls - no obvious signs of trauma on exam - CTH/ cervical neg  Best Practice (right click and "Reselect all SmartList Selections" daily)   Diet/type: tubefeeds DVT prophylaxis: LMWH GI prophylaxis: PPI Lines: N/A Foley:  Yes, and it is still needed Code Status:  full code Last date of multidisciplinary goals of care discussion [8/22]  - Sister updated 8/23 by phone.  Sister and mother live in [place].  Pt has no family locally.    Labs   CBC: Recent Labs  Lab 01/14/23 0417 01/15/23 0401 01/16/23 0257 01/17/23 0715 01/17/23 1757 01/18/23 0300  WBC 9.7 5.9 9.0 8.2  4.5 5.2  NEUTROABS 7.5  --  7.5  --   --   --   HGB 18.3* 15.0 14.6 13.6 14.0 12.9*  HCT 52.8* 43.8 43.2 41.1 42.3 38.4*  MCV 91.0 94.8 94.9 95.8 98.1 95.3  PLT 133* 111* 88* 100* 101* 97*    Basic Metabolic Panel: Recent Labs  Lab 01/15/23 0401 01/16/23 0257 01/17/23 0715 01/17/23 1757 01/18/23 0300  NA 135 136 135 137 139  K 3.5 4.1 2.9* 4.5 3.6  CL 99 102 103 105 113*  CO2 29 24 23 25  21*  GLUCOSE 99 129* 120* 173* 143*  BUN 22* 13 15 11 10   CREATININE 1.01 0.86 1.05 0.97 0.84  CALCIUM 7.9* 7.7* 7.3* 7.6* 7.7*  MG  --  2.0 2.5* 2.7* 2.6*  PHOS  --  2.4* 2.1* 3.0 1.8*   GFR: Estimated Creatinine Clearance: 112.7 mL/min (by C-G formula based on SCr of 0.84 mg/dL). Recent Labs  Lab 01/16/23 0257 01/17/23 0715 01/17/23 1757 01/18/23 0300  WBC 9.0 8.2 4.5 5.2    Liver Function Tests: Recent Labs  Lab 01/14/23 0417 01/15/23 0401 01/16/23 0257 01/17/23 1757  AST 51* 39 37 37  ALT 38 27 24 22   ALKPHOS 56 65 45 42  BILITOT 0.9 1.5* 0.8 0.5  PROT 7.3 6.0* 6.3* 6.7  ALBUMIN 4.3 3.4* 3.4* 3.2*   Recent Labs  Lab 01/14/23 0417  LIPASE 57*   Recent Labs  Lab 01/17/23 1757  AMMONIA <10    ABG    Component Value Date/Time   PHART 7.44 01/16/2023 0900   PCO2ART 38 01/16/2023 0900   PO2ART 292 (H) 01/16/2023 0900   HCO3 25.8 01/16/2023 0900   O2SAT 99.8 01/16/2023 0900     Coagulation Profile: Recent Labs  Lab 01/17/23 1757  INR 1.1    Cardiac Enzymes: Recent Labs  Lab 01/17/23 1757 01/18/23 0257  CKTOTAL 440* 318    HbA1C: Hgb A1c MFr Bld  Date/Time Value Ref Range Status  01/16/2023 02:57 AM 5.4 4.8 - 5.6 % Final    Comment:    (NOTE) Pre diabetes:          5.7%-6.4%  Diabetes:              >6.4%  Glycemic control for   <7.0% adults with diabetes   03/20/2019 03:46 AM 5.4 4.8 - 5.6 % Final    Comment:    (NOTE) Pre diabetes:          5.7%-6.4% Diabetes:              >6.4% Glycemic control for   <7.0% adults with diabetes      CBG: Recent Labs  Lab 01/17/23 1908 01/18/23 0006 01/18/23 0302 01/18/23 0832 01/18/23 0906  GLUCAP 174* 154* 136* 111* 93    Critical care time: 45 mins     Posey Boyer, MSN, NP, AG-ACNP-BC [facility] 01/18/2023, 9:32 AM  See Amion for pager If no response to pager , please call [phone] until 7pm After 7:00 pm call Elink  [phone]

## 2023-01-19 ENCOUNTER — Inpatient Hospital Stay (HOSPITAL_COMMUNITY): Payer: BLUE CROSS/BLUE SHIELD

## 2023-01-19 DIAGNOSIS — G9341 Metabolic encephalopathy: Secondary | ICD-10-CM | POA: Diagnosis not present

## 2023-01-19 DIAGNOSIS — R569 Unspecified convulsions: Secondary | ICD-10-CM | POA: Diagnosis not present

## 2023-01-19 DIAGNOSIS — J9611 Chronic respiratory failure with hypoxia: Secondary | ICD-10-CM | POA: Diagnosis not present

## 2023-01-19 DIAGNOSIS — F10931 Alcohol use, unspecified with withdrawal delirium: Secondary | ICD-10-CM | POA: Diagnosis not present

## 2023-01-19 LAB — PHOSPHORUS
Phosphorus: 3.1 mg/dL (ref 2.5–4.6)
Phosphorus: 4.3 mg/dL (ref 2.5–4.6)

## 2023-01-19 LAB — GLUCOSE, CAPILLARY
Glucose-Capillary: 113 mg/dL — ABNORMAL HIGH (ref 70–99)
Glucose-Capillary: 128 mg/dL — ABNORMAL HIGH (ref 70–99)
Glucose-Capillary: 146 mg/dL — ABNORMAL HIGH (ref 70–99)
Glucose-Capillary: 80 mg/dL (ref 70–99)
Glucose-Capillary: 87 mg/dL (ref 70–99)
Glucose-Capillary: 88 mg/dL (ref 70–99)

## 2023-01-19 LAB — BASIC METABOLIC PANEL
Anion gap: 8 (ref 5–15)
BUN: 15 mg/dL (ref 6–20)
CO2: 22 mmol/L (ref 22–32)
Calcium: 7.9 mg/dL — ABNORMAL LOW (ref 8.9–10.3)
Chloride: 109 mmol/L (ref 98–111)
Creatinine, Ser: 0.95 mg/dL (ref 0.61–1.24)
GFR, Estimated: >60 mL/min (ref >60)
Glucose, Bld: 85 mg/dL (ref 70–99)
Potassium: 4.3 mmol/L (ref 3.5–5.1)
Sodium: 139 mmol/L (ref 135–145)

## 2023-01-19 LAB — URINE CULTURE: Culture: NO GROWTH

## 2023-01-19 LAB — CBC
HCT: 38.5 % — ABNORMAL LOW (ref 39.0–52.0)
Hemoglobin: 13 g/dL (ref 13.0–17.0)
MCH: 32.7 pg (ref 26.0–34.0)
MCHC: 33.8 g/dL (ref 30.0–36.0)
MCV: 97 fL (ref 80.0–100.0)
Platelets: 101 10*3/uL — ABNORMAL LOW (ref 150–400)
RBC: 3.97 MIL/uL — ABNORMAL LOW (ref 4.22–5.81)
RDW: 14.7 % (ref 11.5–15.5)
WBC: 5.9 10*3/uL (ref 4.0–10.5)
nRBC: 0 % (ref 0.0–0.2)

## 2023-01-19 LAB — MAGNESIUM
Magnesium: 2.4 mg/dL (ref 1.7–2.4)
Magnesium: 2.3 mg/dL (ref 1.7–2.4)

## 2023-01-19 MED ORDER — CHLORDIAZEPOXIDE HCL 25 MG PO CAPS: 25.0000 mg | ORAL_CAPSULE | Freq: Every day | ORAL | Status: DC

## 2023-01-19 MED ORDER — PHENOBARBITAL SODIUM 130 MG/ML IJ SOLN
130.0000 mg | Freq: Three times a day (TID) | INTRAMUSCULAR | Status: DC
  Administered 2023-01-19 – 2023-01-20 (×3): 130 mg via INTRAVENOUS
  Filled 2023-01-19 (×3): qty 1

## 2023-01-19 MED ORDER — THIAMINE MONONITRATE 100 MG PO TABS
100.0000 mg | ORAL_TABLET | Freq: Every day | ORAL | Status: DC
  Administered 2023-01-20 – 2023-01-26 (×7): 100 mg
  Filled 2023-01-19 (×8): qty 1

## 2023-01-19 MED ORDER — HYDRALAZINE HCL 20 MG/ML IJ SOLN
10.0000 mg | INTRAMUSCULAR | Status: DC | PRN
  Administered 2023-01-19 – 2023-01-21 (×3): 10 mg via INTRAVENOUS
  Filled 2023-01-19 (×3): qty 1

## 2023-01-19 MED ORDER — CHLORDIAZEPOXIDE HCL 25 MG PO CAPS: 25.0000 mg | ORAL_CAPSULE | Freq: Three times a day (TID) | ORAL | Status: DC

## 2023-01-19 MED ORDER — DEXMEDETOMIDINE HCL IN NACL 400 MCG/100ML IV SOLN
0.0000 ug/kg/h | INTRAVENOUS | Status: DC
  Administered 2023-01-19: 1.2 ug/kg/h via INTRAVENOUS
  Administered 2023-01-19: 0.4 ug/kg/h via INTRAVENOUS
  Administered 2023-01-19 – 2023-01-21 (×12): 1.2 ug/kg/h via INTRAVENOUS
  Filled 2023-01-19: qty 100
  Filled 2023-01-19: qty 200
  Filled 2023-01-19 (×8): qty 100
  Filled 2023-01-19: qty 200
  Filled 2023-01-19: qty 100

## 2023-01-19 MED ORDER — FUROSEMIDE 10 MG/ML IJ SOLN
40.0000 mg | Freq: Once | INTRAMUSCULAR | Status: AC
  Administered 2023-01-19: 40 mg via INTRAVENOUS
  Filled 2023-01-19: qty 4

## 2023-01-19 MED ORDER — CHLORDIAZEPOXIDE HCL 25 MG PO CAPS: 50.0000 mg | ORAL_CAPSULE | Freq: Once | ORAL | Status: DC

## 2023-01-19 MED ORDER — FENTANYL CITRATE PF 50 MCG/ML IJ SOSY
25.0000 ug | PREFILLED_SYRINGE | INTRAMUSCULAR | Status: DC | PRN
  Administered 2023-01-19 – 2023-01-20 (×7): 100 ug via INTRAVENOUS
  Administered 2023-01-20: 50 ug via INTRAVENOUS
  Administered 2023-01-20 – 2023-01-24 (×7): 100 ug via INTRAVENOUS
  Filled 2023-01-19 (×6): qty 2
  Filled 2023-01-19: qty 1
  Filled 2023-01-19 (×8): qty 2

## 2023-01-19 MED ORDER — CHLORDIAZEPOXIDE HCL 25 MG PO CAPS: 25.0000 mg | ORAL_CAPSULE | Freq: Four times a day (QID) | ORAL | Status: DC

## 2023-01-19 MED ORDER — FENTANYL CITRATE PF 50 MCG/ML IJ SOSY
25.0000 ug | PREFILLED_SYRINGE | INTRAMUSCULAR | Status: DC | PRN
  Filled 2023-01-19: qty 1

## 2023-01-19 MED ORDER — CHLORDIAZEPOXIDE HCL 25 MG PO CAPS: 25.0000 mg | ORAL_CAPSULE | ORAL | Status: DC

## 2023-01-19 NOTE — Progress Notes (Signed)
Brief Nutrition Support Note  Pt with concern for refeeding on admission. Received replacements and pt's labs appear stable on last twp draws. Will increase TF advancement to q8h as CBGs consistently in the 80's this AM.   Greig Castilla, RD, LDN Clinical Dietitian RD pager # available in AMION  After hours/weekend pager # available in [facility]

## 2023-01-19 NOTE — Progress Notes (Signed)
Subjective: Continues to be intubated and sedated. EEG leads in place.   Objective: Current vital signs: BP 129/77   Pulse 79   Temp (!) 100.6 F (38.1 C)   Resp 20   Ht 5\' 10"  (1.778 m)   Wt 101.6 kg   SpO2 95%   BMI 32.14 kg/m  Vital signs in last 24 hours: Temp:  [98.2 F (36.8 C)-100.6 F (38.1 C)] 100.6 F (38.1 C) (08/24 1103) Pulse Rate:  [54-103] 79 (08/24 1103) Resp:  [16-23] 20 (08/24 1103) BP: (92-154)/(56-110) 129/77 (08/24 1045) SpO2:  [92 %-100 %] 95 % (08/24 1103) FiO2 (%):  [40 %] 40 % (08/24 1103) Weight:  [101.6 kg-101.8 kg] 101.6 kg (08/24 0500)  Intake/Output from previous day: 08/23 0701 - 08/24 0700 In: 2859.7 [I.V.:1233.6; NG/GT:778.3; IV Piggyback:847.8] Out: 1070 [Urine:1070] Intake/Output this shift: Total I/O In: 522.9 [P.O.:100; I.V.:202.9; NG/GT:120; IV Piggyback:100] Out: 200 [Urine:200] Nutritional status:  Diet Order     None       Physical Exam  HEENT:  [place]/AT. EEG leads in place. No neck stiffness Lungs: Intubated and on sedation Extremities: Warm and well perfused.    Neurological Examination Mental Status: Intubated and on 300  mcg/hr of fentanyl IV, 20 mg/hr of Versed IV. eyes are closed, does not open eyes to stimuli. He does not follow commands. Does not localize to noxious stimuli Cranial Nerves: II: Visual fields tested with no blink to threat bilaterally. Pupils are pinpoint bilaterally (on high-dose fentanyl).    III,IV, VI: Eyes are midline without forced gaze deviation. No nystagmus.  V, VII: Corneal reflex intact bilaterally  VIII: Unable to assess IX,X: Unable to assess  XI: Unable to assess XII: Unable to assess  Motor/Sensory: Decreased tone x 4 in the context of sedation  Unable to formally assess strength in the context of sedation.  Reflexes: Unremarkable reflex exam in the context of sedation Cerebellar/Gait: Unable to assess   Lab Results: Results for orders placed or performed during the hospital  encounter of 01/14/23 (from the past 48 hour(s))  Glucose, capillary     Status: Abnormal   Collection Time: 01/17/23 11:28 AM  Result Value Ref Range   Glucose-Capillary 147 (H) 70 - 99 mg/dL    Comment: Glucose reference range applies only to samples taken after fasting for at least 8 hours.   Comment 1 Notify RN    Comment 2 Document in Chart   Glucose, capillary     Status: Abnormal   Collection Time: 01/17/23  3:45 PM  Result Value Ref Range   Glucose-Capillary 185 (H) 70 - 99 mg/dL    Comment: Glucose reference range applies only to samples taken after fasting for at least 8 hours.  Magnesium     Status: Abnormal   Collection Time: 01/17/23  5:57 PM  Result Value Ref Range   Magnesium 2.7 (H) 1.7 - 2.4 mg/dL    Comment: Performed at [facility], [address] [place], [place] [address]  Phosphorus     Status: None   Collection Time: 01/17/23  5:57 PM  Result Value Ref Range   Phosphorus 3.0 2.5 - 4.6 mg/dL    Comment: Performed at [facility], [address], [place], [place] [address]  Comprehensive metabolic panel     Status: Abnormal   Collection Time: 01/17/23  5:57 PM  Result Value Ref Range   Sodium 137 135 - 145 mmol/L   Potassium 4.5 3.5 - 5.1 mmol/L  Chloride 105 98 - 111 mmol/L   CO2 25 22 - 32 mmol/L   Glucose, Bld 173 (H) 70 - 99 mg/dL    Comment: Glucose reference range applies only to samples taken after fasting for at least 8 hours.   BUN 11 6 - 20 mg/dL   Creatinine, Ser 9.52 0.61 - 1.24 mg/dL   Calcium 7.6 (L) 8.9 - 10.3 mg/dL   Total Protein 6.7 6.5 - 8.1 g/dL   Albumin 3.2 (L) 3.5 - 5.0 g/dL   AST 37 15 - 41 U/L   ALT 22 0 - 44 U/L   Alkaline Phosphatase 42 38 - 126 U/L   Total Bilirubin 0.5 0.3 - 1.2 mg/dL   GFR, Estimated >84 >13 mL/min    Comment: (NOTE) Calculated using the CKD-EPI Creatinine Equation (2021)    Anion gap 7 5 - 15    Comment: Performed at [facility], [address] [place], [place] [address]  Ammonia     Status: None   Collection Time: 01/17/23  5:57 PM  Result Value Ref Range   Ammonia <10 9 - 35 umol/L    Comment: Performed at [facility], [address] [place], [place] [address]  Protime-INR     Status: None   Collection Time: 01/17/23  5:57 PM  Result Value Ref Range   Prothrombin Time 14.8 11.4 - 15.2 seconds   INR 1.1 0.8 - 1.2    Comment: (NOTE) INR goal varies based on device and disease states. Performed at [facility], [address], [place], [place] [address]   CBC     Status: Abnormal   Collection Time: 01/17/23  5:57 PM  Result Value Ref Range   WBC 4.5 4.0 - 10.5 K/uL   RBC 4.31 4.22 - 5.81 MIL/uL   Hemoglobin 14.0 13.0 - 17.0 g/dL   HCT 03.4 74.2 - 59.5 %   MCV 98.1 80.0 - 100.0 fL   MCH 32.5 26.0 - 34.0 pg   MCHC 33.1 30.0 - 36.0 g/dL   RDW 63.8 75.6 - 43.3 %   Platelets 101 (L) 150 - 400 K/uL    Comment: SPECIMEN CHECKED FOR CLOTS Immature Platelet Fraction may be clinically indicated, consider ordering this additional test [id number] REPEATED TO VERIFY    nRBC 0.0 0.0 - 0.2 %    Comment: Performed at [facility], [address] [place], [place] [address]  CK     Status: Abnormal   Collection Time: 01/17/23  5:57 PM  Result Value Ref Range   Total CK 440 (H) 49 - 397 U/L    Comment: Performed at [facility], [address], [place], [place] [address]  Glucose, capillary     Status: Abnormal   Collection Time: 01/17/23  7:08 PM  Result Value Ref Range   Glucose-Capillary 174 (H) 70 - 99 mg/dL    Comment: Glucose reference range applies only to samples taken after fasting for at least 8 hours.   Comment 1 Notify RN    Comment 2 Document in Chart   Glucose, capillary     Status: Abnormal   Collection Time: 01/18/23 12:06 AM  Result Value Ref Range   Glucose-Capillary 154 (H) 70 - 99 mg/dL    Comment: Glucose reference  range applies only to samples taken after fasting for at least 8 hours.  CK     Status: None   Collection Time: 01/18/23  2:57  AM  Result Value Ref Range   Total CK 318 49 - 397 U/L    Comment: Performed at [facility], [address], [place], [place] [address]  Magnesium     Status: Abnormal   Collection Time: 01/18/23  3:00 AM  Result Value Ref Range   Magnesium 2.6 (H) 1.7 - 2.4 mg/dL    Comment: Performed at [facility], [address], [place], [place] [address]  Phosphorus     Status: Abnormal   Collection Time: 01/18/23  3:00 AM  Result Value Ref Range   Phosphorus 1.8 (L) 2.5 - 4.6 mg/dL    Comment: Performed at [facility], [address], [place], [place] [address]  CBC     Status: Abnormal   Collection Time: 01/18/23  3:00 AM  Result Value Ref Range   WBC 5.2 4.0 - 10.5 K/uL   RBC 4.03 (L) 4.22 - 5.81 MIL/uL   Hemoglobin 12.9 (L) 13.0 - 17.0 g/dL   HCT 84.6 (L) 96.2 - 95.2 %   MCV 95.3 80.0 - 100.0 fL   MCH 32.0 26.0 - 34.0 pg   MCHC 33.6 30.0 - 36.0 g/dL   RDW 84.1 32.4 - 40.1 %   Platelets 97 (L) 150 - 400 K/uL    Comment: SPECIMEN CHECKED FOR CLOTS Immature Platelet Fraction may be clinically indicated, consider ordering this additional test [id number] REPEATED TO VERIFY PLATELET COUNT CONFIRMED BY SMEAR    nRBC 0.0 0.0 - 0.2 %    Comment: Performed at [facility], [address], [place], [place] [address]  Basic metabolic panel     Status: Abnormal   Collection Time: 01/18/23  3:00 AM  Result Value Ref Range   Sodium 139 135 - 145 mmol/L   Potassium 3.6 3.5 - 5.1 mmol/L   Chloride 113 (H) 98 - 111 mmol/L   CO2 21 (L) 22 - 32 mmol/L   Glucose, Bld 143 (H) 70 - 99 mg/dL    Comment: Glucose reference range applies only to samples taken after fasting for at least 8 hours.   BUN 10 6 - 20 mg/dL   Creatinine, Ser 7.42 0.61 - 1.24 mg/dL   Calcium 7.7 (L) 8.9 - 10.3 mg/dL   GFR,  Estimated >59 >56 mL/min    Comment: (NOTE) Calculated using the CKD-EPI Creatinine Equation (2021)    Anion gap 5 5 - 15    Comment: Performed at [facility], [address], [place], [place] [address]  Glucose, capillary     Status: Abnormal   Collection Time: 01/18/23  3:02 AM  Result Value Ref Range   Glucose-Capillary 136 (H) 70 - 99 mg/dL    Comment: Glucose reference range applies only to samples taken after fasting for at least 8 hours.  Glucose, capillary     Status: Abnormal   Collection Time: 01/18/23  8:32 AM  Result Value Ref Range   Glucose-Capillary 111 (H) 70 - 99 mg/dL    Comment: Glucose reference range applies only to samples taken after fasting for at least 8 hours.   Comment 1 Notify RN    Comment 2 Document in Chart   Glucose, capillary     Status: None   Collection Time: 01/18/23  9:06 AM  Result Value Ref Range   Glucose-Capillary 93 70 - 99 mg/dL    Comment: Glucose reference range applies only to samples taken after fasting for at least 8 hours.  Blood gas, arterial     Status: None   Collection Time: 01/18/23  9:50 AM  Result Value Ref Range   FIO2 40% %   O2 Content 40% L/min   Delivery systems VENTILATOR    Mode PRESSURE REGULATED VOLUME CONTROL    pH, Arterial 7.4 7.35 - 7.45   pCO2 arterial 38 32 - 48 mmHg   pO2, Arterial 96 83 - 108 mmHg   Bicarbonate 23.5 20.0 - 28.0 mmol/L   Acid-base deficit 1.1 0.0 - 2.0 mmol/L   O2 Saturation 99.5 %   Patient temperature 37.0    Collection site RIGHT RADIAL    Allens test (pass/fail) PASS PASS    Comment: Performed at [facility], [address] [place], [place] [address]  Glucose, capillary     Status: None   Collection Time: 01/18/23 11:31 AM  Result Value Ref Range   Glucose-Capillary 91 70 - 99 mg/dL    Comment: Glucose reference range applies only to samples taken after fasting for at least 8 hours.  Glucose, capillary     Status: Abnormal   Collection  Time: 01/18/23  3:34 PM  Result Value Ref Range   Glucose-Capillary 123 (H) 70 - 99 mg/dL    Comment: Glucose reference range applies only to samples taken after fasting for at least 8 hours.  Magnesium     Status: Abnormal   Collection Time: 01/18/23  6:10 PM  Result Value Ref Range   Magnesium 2.5 (H) 1.7 - 2.4 mg/dL    Comment: Performed at [facility], [address], [place], [place] [address]  Phosphorus     Status: None   Collection Time: 01/18/23  6:10 PM  Result Value Ref Range   Phosphorus 3.0 2.5 - 4.6 mg/dL    Comment: Performed at [facility], [address], [place], [place] [address]  Glucose, capillary     Status: Abnormal   Collection Time: 01/18/23  7:25 PM  Result Value Ref Range   Glucose-Capillary 126 (H) 70 - 99 mg/dL    Comment: Glucose reference range applies only to samples taken after fasting for at least 8 hours.  Glucose, capillary     Status: Abnormal   Collection Time: 01/18/23 11:17 PM  Result Value Ref Range   Glucose-Capillary 105 (H) 70 - 99 mg/dL    Comment: Glucose reference range applies only to samples taken after fasting for at least 8 hours.  Glucose, capillary     Status: None   Collection Time: 01/19/23  3:14 AM  Result Value Ref Range   Glucose-Capillary 88 70 - 99 mg/dL    Comment: Glucose reference range applies only to samples taken after fasting for at least 8 hours.  CBC     Status: Abnormal   Collection Time: 01/19/23  6:10 AM  Result Value Ref Range   WBC 5.9 4.0 - 10.5 K/uL   RBC 3.97 (L) 4.22 - 5.81 MIL/uL   Hemoglobin 13.0 13.0 - 17.0 g/dL   HCT 28.4 (L) 13.2 - 44.0 %   MCV 97.0 80.0 - 100.0 fL   MCH 32.7 26.0 - 34.0 pg   MCHC 33.8 30.0 - 36.0 g/dL   RDW 10.2 72.5 - 36.6 %   Platelets 101 (L) 150 - 400 K/uL    Comment: Immature Platelet Fraction may be clinically indicated, consider ordering this additional test [id number] REPEATED TO VERIFY    nRBC 0.0 0.0 - 0.2 %  Comment: Performed at [facility], [address], [place], [place] [address]  Basic metabolic panel     Status: Abnormal   Collection Time: 01/19/23  6:10 AM  Result Value Ref Range   Sodium 139 135 - 145 mmol/L   Potassium 4.3 3.5 - 5.1 mmol/L   Chloride 109 98 - 111 mmol/L   CO2 22 22 - 32 mmol/L   Glucose, Bld 85 70 - 99 mg/dL    Comment: Glucose reference range applies only to samples taken after fasting for at least 8 hours.   BUN 15 6 - 20 mg/dL   Creatinine, Ser 6.04 0.61 - 1.24 mg/dL   Calcium 7.9 (L) 8.9 - 10.3 mg/dL   GFR, Estimated >54 >09 mL/min    Comment: (NOTE) Calculated using the CKD-EPI Creatinine Equation (2021)    Anion gap 8 5 - 15    Comment: Performed at [facility], [address] [place], [place] [address]  Magnesium     Status: None   Collection Time: 01/19/23  6:10 AM  Result Value Ref Range   Magnesium 2.4 1.7 - 2.4 mg/dL    Comment: Performed at [facility], [address] [place], [place] [address]  Phosphorus     Status: None   Collection Time: 01/19/23  6:10 AM  Result Value Ref Range   Phosphorus 3.1 2.5 - 4.6 mg/dL    Comment: Performed at [facility], [address] [place], [place] [address]  Glucose, capillary     Status: None   Collection Time: 01/19/23  7:31 AM  Result Value Ref Range   Glucose-Capillary 80 70 - 99 mg/dL    Comment: Glucose reference range applies only to samples taken after fasting for at least 8 hours.    Recent Results (from the past 240 hour(s))  MRSA Next Gen by PCR, Nasal     Status: None   Collection Time: 01/15/23 10:27 AM   Specimen: Nasal Mucosa; Nasal Swab  Result Value Ref Range Status   MRSA by PCR Next Gen NOT DETECTED NOT DETECTED Final    Comment: (NOTE) The GeneXpert MRSA Assay (FDA approved for NASAL specimens only), is one component of a comprehensive MRSA colonization surveillance program. It is not intended to diagnose MRSA infection nor to guide or monitor treatment for MRSA infections. Test  performance is not FDA approved in patients less than 52 years old. Performed at [facility], [address] [place], [place] [address]   SARS Coronavirus 2 by RT PCR (hospital order, performed in [facility] hospital lab) *cepheid single result test* Anterior Nasal Swab     Status: None   Collection Time: 01/15/23  6:02 PM   Specimen: Anterior Nasal Swab  Result Value Ref Range Status   SARS Coronavirus 2 by RT PCR NEGATIVE NEGATIVE Final    Comment: (NOTE) SARS-CoV-2 target nucleic acids are NOT DETECTED.  The SARS-CoV-2 RNA is generally detectable in upper and lower respiratory specimens during the acute phase of infection. The lowest concentration of SARS-CoV-2 viral copies this assay can detect is 250 copies / mL. A negative result does not preclude SARS-CoV-2 infection and should not be used as the sole basis for treatment or other patient management decisions.  A negative result may occur with improper specimen collection / handling, submission of specimen other than nasopharyngeal swab, presence of viral mutation(s) within the areas targeted by this assay, and inadequate number of viral  copies (<250 copies / mL). A negative result must be combined with clinical observations, patient history, and epidemiological information.  Fact Sheet for Patients:   RoadLapTop.co.za  Fact Sheet for Healthcare Providers: http://kim-miller.com/  This test is not yet approved or  cleared by the [place] FDA and has been authorized for detection and/or diagnosis of SARS-CoV-2 by FDA under an Emergency Use Authorization (EUA).  This EUA will remain in effect (meaning this test can be used) for the duration of the COVID-19 declaration under Section 564(b)(1) of the Act, 21 U.S.C. section 360bbb-3(b)(1), unless the authorization is terminated or revoked sooner.  Performed at [facility], [address] [place], [place] [address]   Culture, Respiratory w Gram Stain     Status: None   Collection Time: 01/16/23  9:12 AM   Specimen: Tracheal Aspirate; Respiratory  Result Value Ref Range Status   Specimen Description   Final    TRACHEAL ASPIRATE Performed at [facility], [address], [place], [place] [address]    Special Requests   Final    NONE Performed at [facility], [address] [place], [place] [address]    Gram Stain   Final    FEW WBC PRESENT, PREDOMINANTLY PMN RARE GRAM POSITIVE RODS    Culture   Final    RARE Normal respiratory flora-no Staph aureus or Pseudomonas seen Performed at [facility], [address], [place], [place] [address]    Report Status 01/18/2023 FINAL  Final  Culture, blood (Routine X 2) w Reflex to ID Panel     Status: None (Preliminary result)   Collection Time: 01/17/23 10:23 AM   Specimen: BLOOD RIGHT ARM  Result Value Ref Range Status   Specimen Description   Final    BLOOD RIGHT ARM AEROBIC BOTTLE ONLY ANAEROBIC BOTTLE ONLY Performed at [facility], [address] [place], [place] [address]    Special Requests   Final    BOTTLES DRAWN AEROBIC AND ANAEROBIC Blood Culture adequate volume Performed at [facility], [address] [place], [place] [address]    Culture   Final    NO GROWTH 2 DAYS Performed at [facility], [address] [place], [place] [address]    Report Status PENDING  Incomplete  Culture, blood (Routine X 2) w Reflex to ID Panel     Status: None (Preliminary result)   Collection Time: 01/17/23 10:23 AM   Specimen: BLOOD RIGHT HAND  Result Value Ref Range Status   Specimen Description   Final    BLOOD RIGHT HAND AEROBIC BOTTLE ONLY ANAEROBIC BOTTLE ONLY Performed at [facility], [address] [place], [place] [address]    Special Requests   Final    BOTTLES DRAWN AEROBIC AND ANAEROBIC Blood Culture  adequate volume Performed at [facility], [address] [place], [place] [address]    Culture   Final    NO GROWTH 2 DAYS Performed at [facility], [address] [place], [place] [address]    Report Status PENDING  Incomplete  Urine Culture (for pregnant, neutropenic or urologic patients or patients with an indwelling urinary catheter)     Status: None   Collection Time: 01/17/23 10:35 AM   Specimen: Urine, Catheterized  Result Value Ref Range Status   Specimen Description   Final    URINE, CATHETERIZED Performed at [facility], [address] [place], [place] [address]    Special Requests   Final    NONE Performed at [facility], [address] [place], [place] [address]    Culture   Final    NO GROWTH Performed at [facility], [address] [place], [place] [address]    Report Status 01/19/2023 FINAL  Final    Lipid Panel Recent Labs    01/17/23 0715  TRIG 100    Studies/Results: Overnight EEG with video  Result Date: 01/19/2023 Charlsie Quest, MD     01/19/2023  6:30 AM Patient Name: Jermaine [name] MRN: [id number] Epilepsy Attending: Charlsie Quest Referring Physician/Provider: Caryl Pina, MD Duration: 01/18/2023 1644 to 01/19/2023 0630 Patient history: 57yo M with seizure-like episodes with posturing in a patient who initially presented on 8/19 with alcohol withdrawal symptoms. EEG to evaluate for seizure Level of alertness: comatose AEDs during EEG study: Phenobarb, LEV, propofol, Versed Technical aspects: This EEG study was done with scalp electrodes positioned according to the 10-20 International system of electrode placement. Electrical activity was reviewed with band pass filter of 1-70Hz , sensitivity of 7 uV/mm, display speed of 40mm/sec with a 60Hz  notched filter applied as appropriate. EEG data were recorded continuously and digitally stored.  Video monitoring was available and  reviewed as appropriate. Description: EEG showed continuous generalized 3 to 6 Hz theta-delta slowing admixed with an excessive amount of 15 to 18 Hz beta activity distributed symmetrically and diffusely. Hyperventilation and photic stimulation were not performed.    IMPRESSION: This study was suggestive of severe diffuse encephalopathy, likely related to sedation. No seizures or epileptiform discharges were seen throughout the recording.  Priyanka [name]   Rapid EEG  Result Date: 01/18/2023 Charlsie Quest, MD     01/18/2023 11:24 AM Patient Name: Jermaine [name] MRN: [id number] Epilepsy Attending: Charlsie Quest Referring Physician/Provider: Caryl Pina, MD Duration: 44.58 mins  Patient history: 57yo Fpt having generalized tonic clonic movements with right sided decorticate posturing. EEG to evaluate for seizure.  Level of alertness: comatose  AEDs during EEG study: Phenobarb, propofol, versed  Technical aspects: This EEG was obtained using a 10 lead EEG system positioned circumferentially without any parasagittal coverage (rapid EEG). Computer selected EEG is reviewed as  well as background features and all clinically significant events.  Description: EEG showed continuous generalized 3 to 6 Hz theta-delta slowing admixed with an excessive amount of 15 to 18 Hz beta activity distributed symmetrically and diffusely. Hyperventilation and photic stimulation were not performed.    IMPRESSION: This limited ceribell EEG was suggestive of severe diffuse encephalopathy, likely related to sedation. No seizures or epileptiform discharges were seen throughout the recording.  If suspicion for interictal activity remains a concern, a conventional EEG be considered.  Charlsie Quest   ECHOCARDIOGRAM COMPLETE  Result Date: 01/18/2023    ECHOCARDIOGRAM REPORT   Patient Name:   Jermaine [name] Date of Exam: 01/18/2023 Medical Rec #:  [id number]     Height:       70.0 in Accession #:    [id number]    Weight:       211.0 lb  Date of Birth:  08-06-1965     BSA:          2.135 m Patient Age:    57 years      BP:           125/84 mmHg Patient Gender: M  HR:           103 bpm. Exam Location:  Inpatient Procedure: 2D Echo, Color Doppler, Cardiac Doppler and Intracardiac            Opacification Agent Indications:    Hypotension  History:        Patient has no prior history of Echocardiogram examinations.                 Risk Factors:Obesity. ETOH, alcohol withdrawal.  Sonographer:    Milda Smart Referring Phys: [id number] PAULA B SIMPSON  Sonographer Comments: Echo performed with patient supine and on artificial respirator. Image acquisition challenging due to patient body habitus and Image acquisition challenging due to respiratory motion. IMPRESSIONS  1. Left ventricular ejection fraction, by estimation, is 65 to 70%. The left ventricle has normal function. The left ventricle has no regional wall motion abnormalities. Left ventricular diastolic parameters were normal.  2. Right ventricular systolic function is normal. The right ventricular size is normal.  3. The mitral valve is normal in structure. Trivial mitral valve regurgitation. No evidence of mitral stenosis.  4. The aortic valve is normal in structure. Aortic valve regurgitation is not visualized. No aortic stenosis is present.  5. The inferior vena cava is normal in size with greater than 50% respiratory variability, suggesting right atrial pressure of 3 mmHg.  6. Technically limited study due to poor sound wave transmission. FINDINGS  Left Ventricle: Left ventricular ejection fraction, by estimation, is 65 to 70%. The left ventricle has normal function. The left ventricle has no regional wall motion abnormalities. Definity contrast agent was given IV to delineate the left ventricular  endocardial borders. The left ventricular internal cavity size was normal in size. There is no left ventricular hypertrophy. Left ventricular diastolic parameters were normal. Right  Ventricle: The right ventricular size is normal. No increase in right ventricular wall thickness. Right ventricular systolic function is normal. Left Atrium: Left atrial size was normal in size. Right Atrium: Right atrial size was normal in size. Pericardium: There is no evidence of pericardial effusion. Mitral Valve: The mitral valve is normal in structure. Trivial mitral valve regurgitation. No evidence of mitral valve stenosis. Tricuspid Valve: The tricuspid valve is normal in structure. Tricuspid valve regurgitation is trivial. No evidence of tricuspid stenosis. Aortic Valve: The aortic valve is normal in structure. Aortic valve regurgitation is not visualized. No aortic stenosis is present. Pulmonic Valve: The pulmonic valve was normal in structure. Pulmonic valve regurgitation is not visualized. No evidence of pulmonic stenosis. Aorta: The aortic root is normal in size and structure. Venous: The inferior vena cava is normal in size with greater than 50% respiratory variability, suggesting right atrial pressure of 3 mmHg. IAS/Shunts: No atrial level shunt detected by color flow Doppler.  LEFT VENTRICLE PLAX 2D LVIDd:         5.10 cm   Diastology LVIDs:         4.10 cm   LV e' medial:  8.05 cm/s LV PW:         1.00 cm   LV e' lateral: 11.30 cm/s LV IVS:        1.00 cm LVOT diam:     2.50 cm LV SV:         134 LV SV Index:   63 LVOT Area:     4.91 cm  RIGHT VENTRICLE RV S prime:     23.20 cm/s TAPSE (M-mode): 2.6 cm LEFT ATRIUM  Index        RIGHT ATRIUM           Index LA diam:        3.00 cm 1.40 cm/m   RA Area:     18.90 cm LA Vol (A2C):   52.6 ml 24.63 ml/m  RA Volume:   60.50 ml  28.33 ml/m LA Vol (A4C):   43.6 ml 20.42 ml/m LA Biplane Vol: 48.0 ml 22.48 ml/m  AORTIC VALVE LVOT Vmax:   154.00 cm/s LVOT Vmean:  116.000 cm/s LVOT VTI:    0.272 m  AORTA Ao Root diam: 3.70 cm Ao Asc diam:  3.70 cm  SHUNTS Systemic VTI:  0.27 m Systemic Diam: 2.50 cm Arvilla Meres MD Electronically signed by  Arvilla Meres MD Signature Date/Time: 01/18/2023/9:48:44 AM    Final    Rapid EEG  Result Date: 01/17/2023 Charlsie Quest, MD     01/18/2023  9:59 AM Patient Name: Jermaine [name] MRN: [id number] Epilepsy Attending: Charlsie Quest Referring Physician/Provider: Luciano Cutter, MD Duration: 01/17/2023 1803 to 01/17/2023  2059 Patient history: 57yo Fpt having generalized tonic clonic movements with right sided decorticate posturing. EEG to evaluate for seizure. Level of alertness: Awake, asleep-->comatose AEDs during EEG study: Phenobarb, propofol, versed Technical aspects: This EEG was obtained using a 10 lead EEG system positioned circumferentially without any parasagittal coverage (rapid EEG). Computer selected EEG is reviewed as  well as background features and all clinically significant events. Description: EEG initially showed posterior dominant rhythm of 9 Hz activity of moderate voltage (25-35 uV) seen predominantly in posterior head regions, symmetric and reactive to eye opening and eye closing. Sleep was characterized by vertex waves, sleep spindles (12 to 14 Hz), maximal frontocentral region. Gradually as sedation was adjusted, EEG showed continuous generalized 3 to 6 Hz theta-delta slowing admixed with an excessive amount of 15 to 18 Hz beta activity distributed symmetrically and diffusely. Hyperventilation and photic stimulation were not performed.   IMPRESSION: This limited ceribell EEG was initially within normal limits. Gradually as sedation was adjusted, EEG was suggestive of severe diffuse encephalopathy, likely related to sedation. No seizures or epileptiform discharges were seen throughout the recording. If suspicion for interictal activity remains a concern, a conventional EEG be considered. Charlsie Quest   MR BRAIN W WO CONTRAST  Result Date: 01/17/2023 CLINICAL DATA:  Seizure, new-onset, no history of trauma unknown hx pta EXAM: MRI HEAD WITHOUT AND WITH CONTRAST TECHNIQUE:  Multiplanar, multiecho pulse sequences of the brain and surrounding structures were obtained without and with intravenous contrast. CONTRAST:  9mL GADAVIST GADOBUTROL 1 MMOL/ML IV SOLN COMPARISON:  CT head from today. FINDINGS: Brain: No acute infarction, hemorrhage, hydrocephalus, extra-axial collection or mass lesion. Mild scattered small T2/FLAIR hyperintensities the white matter, nonspecific but considered within normal limits for patient age. Unremarkable hippocampi. No pathologic enhancement. Vascular: Major arterial flow voids are maintained skull base. Skull and upper cervical spine: Normal marrow signal. Sinuses/Orbits: Mild paranasal sinus mucosal thickening. No acute orbital findings. Other: Trace bilateral mastoid effusions. IMPRESSION: Unremarkable brain MRI.  No evidence of acute abnormality. Electronically Signed   By: Feliberto Harts M.D.   On: 01/17/2023 18:03   CT HEAD WO CONTRAST ( )  Result Date: 01/17/2023 CLINICAL DATA:  Seizure, new-onset, no history of trauma R sided decorticate posturing with seizure like activity; Neck trauma, intoxicated or obtunded (Age >= 16y) possible fall prior to arrival. EXAM: CT HEAD WITHOUT CONTRAST CT CERVICAL SPINE WITHOUT CONTRAST TECHNIQUE: Multidetector CT  imaging of the head and cervical spine was performed following the standard protocol without intravenous contrast. Multiplanar CT image reconstructions of the cervical spine were also generated. RADIATION DOSE REDUCTION: This exam was performed according to the departmental dose-optimization program which includes automated exposure control, adjustment of the mA and/or kV according to patient size and/or use of iterative reconstruction technique. COMPARISON:  None Available. FINDINGS: CT HEAD FINDINGS Brain: No acute intracranial hemorrhage. Gray-white differentiation is preserved. No hydrocephalus or extra-axial collection. No mass effect or midline shift. Vascular: No hyperdense vessel or  unexpected calcification. Skull: No calvarial fracture or suspicious bone lesion. Skull base is unremarkable. Sinuses/Orbits: No acute finding. Other: None. CT CERVICAL SPINE FINDINGS Alignment: Normal. Skull base and vertebrae: No acute fracture. Normal craniocervical junction. No suspicious bone lesions. Soft tissues and spinal canal: No prevertebral fluid or swelling. No visible canal hematoma. Disc levels:  No significant degenerative change. Upper chest: No acute findings. Other: None. IMPRESSION: 1. No acute intracranial abnormality. 2. No acute cervical spine fracture or traumatic listhesis. Electronically Signed   By: Orvan Falconer M.D.   On: 01/17/2023 17:08   CT CERVICAL SPINE WO CONTRAST  Result Date: 01/17/2023 CLINICAL DATA:  Seizure, new-onset, no history of trauma R sided decorticate posturing with seizure like activity; Neck trauma, intoxicated or obtunded (Age >= 16y) possible fall prior to arrival. EXAM: CT HEAD WITHOUT CONTRAST CT CERVICAL SPINE WITHOUT CONTRAST TECHNIQUE: Multidetector CT imaging of the head and cervical spine was performed following the standard protocol without intravenous contrast. Multiplanar CT image reconstructions of the cervical spine were also generated. RADIATION DOSE REDUCTION: This exam was performed according to the departmental dose-optimization program which includes automated exposure control, adjustment of the mA and/or kV according to patient size and/or use of iterative reconstruction technique. COMPARISON:  None Available. FINDINGS: CT HEAD FINDINGS Brain: No acute intracranial hemorrhage. Gray-white differentiation is preserved. No hydrocephalus or extra-axial collection. No mass effect or midline shift. Vascular: No hyperdense vessel or unexpected calcification. Skull: No calvarial fracture or suspicious bone lesion. Skull base is unremarkable. Sinuses/Orbits: No acute finding. Other: None. CT CERVICAL SPINE FINDINGS Alignment: Normal. Skull base and  vertebrae: No acute fracture. Normal craniocervical junction. No suspicious bone lesions. Soft tissues and spinal canal: No prevertebral fluid or swelling. No visible canal hematoma. Disc levels:  No significant degenerative change. Upper chest: No acute findings. Other: None. IMPRESSION: 1. No acute intracranial abnormality. 2. No acute cervical spine fracture or traumatic listhesis. Electronically Signed   By: Orvan Falconer M.D.   On: 01/17/2023 17:08    Medications: Scheduled:  arformoterol  15 mcg Nebulization BID   budesonide (PULMICORT) nebulizer solution  0.5 mg Nebulization BID   chlordiazePOXIDE  25 mg Oral QID   Followed by   Melene Muller ON 01/20/2023] chlordiazePOXIDE  25 mg Oral TID   Followed by   Melene Muller ON 01/21/2023] chlordiazePOXIDE  25 mg Oral BH-qamhs   Followed by   Melene Muller ON 01/22/2023] chlordiazePOXIDE  25 mg Oral Daily   chlordiazePOXIDE  50 mg Oral Once   Chlorhexidine Gluconate Cloth  6 each Topical Daily   docusate  100 mg Per Tube BID   enoxaparin (LOVENOX) injection  40 mg Subcutaneous Q24H   folic acid  1 mg Per Tube Daily   guaiFENesin  15 mL Per Tube Q4H   methylPREDNISolone (SOLU-MEDROL) injection  40 mg Intravenous Daily   multivitamin with minerals  1 tablet Per Tube Daily   mouth rinse  15 mL Mouth Rinse  Q2H   oxyCODONE  10 mg Per Tube Q6H   pantoprazole (PROTONIX) IV  40 mg Intravenous Daily   PHENObarbital  65 mg Intravenous Q8H   Followed by   [START ON 01/20/2023] PHENObarbital  32.5 mg Intravenous Q8H   polyethylene glycol  17 g Per Tube Daily   sodium chloride HYPERTONIC  4 mL Nebulization BID   thiamine  100 mg Intravenous Daily   Continuous:  ampicillin-sulbactam (UNASYN) IV Stopped (01/19/23 0856)   feeding supplement (VITAL AF 1.2 CAL) 30 mL/hr at 01/19/23 1000   levETIRAcetam Stopped (01/19/23 0539)   midazolam 20 mg/hr (01/19/23 1000)   norepinephrine (LEVOPHED) Adult infusion Stopped (01/18/23 1701)   Assessment: Inpatient seizure-like  episodes with posturing in a patient who initially presented on 8/19 with alcohol withdrawal symptoms - Exam reveals a sedated and intubated patient without posturing or seizure-like activity. Versed currently at 20 mg/hr, with fentanyl at 300 mcg/hr.   - Imaging was unremarkable:  - CT head and neck: No acute intracranial abnormality. No acute cervical spine fracture or traumatic listhesis.  - MRI brain: Unremarkable brain MRI. No evidence of acute abnormality.  - EEGs: - Initial Ceribell rapid EEG (8/22): This limited ceribell EEG was initially within normal limits. Gradually as sedation was adjusted, EEG was suggestive of severe diffuse encephalopathy, likely related to sedation. No seizures or epileptiform discharges were seen throughout the recording.  - Subsequent Ceribell rapid EEG (8/23): This limited ceribell EEG was suggestive of severe diffuse encephalopathy, likely related to sedation. No seizures or epileptiform discharges were seen throughout the recording. - LTM EEG for today AM: EEG showed continuous generalized 3 to 6 Hz theta-delta slowing admixed with an excessive amount of 15 to 18 Hz beta activity distributed symmetrically and diffusely. This study was suggestive of severe diffuse encephalopathy, likely related to sedation. No seizures or epileptiform discharges were seen throughout the recording. - Overall impression: Severe alcohol withdrawal syndrome requiring high levels of sedating medications. Had possible clinical withdrawal seizures during initial ICU stay at [facility], but subsequent EEGs have been negative for electrographic seizure activity. The movements described more likely were due to agitation in the setting of insufficient sedation in this patient with unusually high EtOH intake, even for an EtOH abuser.    Recommendations: - Inpatient seizure precautions  - Continue LTM EEG  - Continue phenobarb taper - Now also on Librium - Discontinuing IV Keppra.  - Titrate Versed  to agitation and possible electrographic seizure activity.  - Attempt to wean off fentanyl gtt - Discussed with CCM - Neurology will continue to follow    35 minutes spent in the neurological evaluation and management of this critically ill patient   LOS: 5 days   @Electronically  signed: Dr. Caryl Pina 01/19/2023  11:18 AM

## 2023-01-19 NOTE — Progress Notes (Addendum)
NAME:  Jermaine [name], MRN:  [id number], DOB:  04/08/1966, LOS: 5 ADMISSION DATE:  01/14/2023, CONSULTATION DATE:  01/16/23 REFERRING MD:  TRH, CHIEF COMPLAINT:  ETOH withdrawal   History of Present Illness:  57 year old male with PMH of anxiety, depression, and ETOH abuse who presented 8/19 after reports of binge drinking for the last 2-3 months and decided to quit.  Last ETOH 8/19.  Admitted to [facility] in active ETOH withdrawal, ETOH 342 on admit, started on withdrawal protocol, however worsening agitation and secretions overnight 8/20, requiring 4L [place].  Was started on precedex but CIWAs remained 18-43.  Phenobarbital taper ordered by but not able to be given while NPO.  PCCM consulted for further evaluation.  Required intubation for respiratory secretions and airway protection.   Pertinent  Medical History   Past Medical History:  Diagnosis Date   Alcohol abuse    Anxiety    Class 1 obesity 09/14/2021   Depression     Significant Hospital Events: Including procedures, antibiotic start and stop dates in addition to other pertinent events   8/19 admitted ETOH withdrawal 8/21 PCCM consult, intubated, fever, abx for aspiration 8/22 mucous plugging, seizure like activity, CTH/ MRI brain, ceribell neg 8/23 seen by neurology.  All EEGs negative.  No evidence of seizure.  Felt to be primarily metabolic encephalopathy 8/23 EEGs all negative for seizure.  Weaning fentanyl to off, weaning Versed, initiating Librium taper Interim History / Subjective:  Heavily sedated.  Spoke with neurology no evidence of seizure  Objective   Blood pressure 100/64, pulse 80, temperature 100.2 F (37.9 C), resp. rate 20, height 5\' 10"  (1.778 m), weight 101.6 kg, SpO2 93%.    Vent Mode: PRVC FiO2 (%):  [40 %] 40 % Set Rate:  [20 bmp] 20 bmp Vt Set:  [580 mL] 580 mL PEEP:  [5 cmH20] 5 cmH20 Plateau Pressure:  [19 cmH20-20 cmH20] 20 cmH20   Intake/Output Summary (Last 24 hours) at 01/19/2023 1046 Last data filed at  01/19/2023 0800 Gross per 24 hour  Intake 2492.7 ml  Output 1070 ml  Net 1422.7 ml   Filed Weights   01/18/23 0500 01/18/23 1137 01/19/23 0500  Weight: 95.7 kg 101.8 kg 101.6 kg   Examination:  General 57 year old male patient currently heavily sedated on Versed HEENT normocephalic atraumatic orally intubated Neuro heavily sedated.  He grimaces to pain, earlier stuck tongue out to command, I cannot get him to reproduce this Pulmonary some scattered rhonchi Cardiac regular rate and rhythm Abdomen soft nontender Extremities warm dry with brisk capillary refill  Resolved Hospital Problem list   Shock (resolved. Mixed picture of sepsis and hypovolemia)   Assessment & Plan:   Acute metabolic encephalopathy due to alcohol withdrawal with severe delirium tremens  EEG negative for seizure, neurology thinks this was all severe withdrawal CT head and MRI negative Plan Discontinue fentanyl infusion Initiate Librium taper Will increase phenobarb and hold at 130mg /d then start taper once we are at sedation goal. If needs agitation support would actually increase phenobarb Wean Versed to off Once versed off if not able to extubate and still needs sedation will add precedex  Change RASS goal to -1 Continue thiamine and folate Avoid fever, keep euglycemic Continuing Keppra for now   Acute hypoxic respiratory insufficiency  Aspiration pneumonia Mucous plugging w/ associated atelectasis  Sputum with normal flora Last chest x-ray on the 22nd stable Plan Continuing full ventilator support, with plan to attempt pressure support trial once Versed infusion off  VAP bundle PAD protocol RASS goal -1 Continue via tube Mucinex complete 3d Nebulized HTS Complete 5 d solumedrol  Continue scheduled Brovana and Pulmicort As needed albuterol Day 4 of 5 Unasyn  Intermittent fluid and electrolyte imbalance  Plan monitor closely for refeeding syndrome given hx of ETOH abuse goal K> 4, Mag >  2   Chronic thrombocytopenia - likely from ETOH abuse - plts tend stable, no evidence of bleeding Plan Trend cbc   At risk for malnutrition Plan TF per RD.   Possible hx of falls - no obvious signs of trauma on exam - CTH/ cervical neg Plan Monitor   Best Practice (right click and "Reselect all SmartList Selections" daily)   Diet/type: tubefeeds DVT prophylaxis: LMWH GI prophylaxis: PPI Lines: N/A Foley:  Yes, and it is still needed Code Status:  full code Last date of multidisciplinary goals of care discussion [8/22]     Critical care time: 32 min     Simonne Martinet ACNP-BC [facility] Pulmonary/Critical Care Pager # [phone] OR # [phone] if no answer

## 2023-01-19 NOTE — Plan of Care (Signed)
  Problem: Pain Managment: Goal: General experience of comfort will improve Outcome: Progressing   Problem: Education: Goal: Knowledge of General Education information will improve Description: Including pain rating scale, medication(s)/side effects and non-pharmacologic comfort measures Outcome: Not Progressing   Problem: Health Behavior/Discharge Planning: Goal: Ability to manage health-related needs will improve Outcome: Not Progressing   Problem: Clinical Measurements: Goal: Ability to maintain clinical measurements within normal limits will improve Outcome: Not Progressing Goal: Will remain free from infection Outcome: Not Progressing Goal: Diagnostic test results will improve Outcome: Not Progressing Goal: Respiratory complications will improve Outcome: Not Progressing Goal: Cardiovascular complication will be avoided Outcome: Not Progressing   Problem: Activity: Goal: Risk for activity intolerance will decrease Outcome: Not Progressing   Problem: Nutrition: Goal: Adequate nutrition will be maintained Outcome: Not Progressing   Problem: Coping: Goal: Level of anxiety will decrease Outcome: Not Progressing   Problem: Elimination: Goal: Will not experience complications related to bowel motility Outcome: Not Progressing Goal: Will not experience complications related to urinary retention Outcome: Not Progressing   Problem: Safety: Goal: Ability to remain free from injury will improve Outcome: Not Progressing   Problem: Skin Integrity: Goal: Risk for impaired skin integrity will decrease Outcome: Not Progressing   Problem: Safety: Goal: Non-violent Restraint(s) Outcome: Not Progressing   Problem: Activity: Goal: Ability to tolerate increased activity will improve Outcome: Not Progressing   Problem: Respiratory: Goal: Ability to maintain a clear airway and adequate ventilation will improve Outcome: Not Progressing   Problem: Role Relationship: Goal:  Method of communication will improve Outcome: Not Progressing

## 2023-01-19 NOTE — Plan of Care (Signed)
  Problem: Education: Goal: Knowledge of General Education information will improve Description: Including pain rating scale, medication(s)/side effects and non-pharmacologic comfort measures Outcome: Progressing   Problem: Health Behavior/Discharge Planning: Goal: Ability to manage health-related needs will improve Outcome: Progressing   Problem: Clinical Measurements: Goal: Ability to maintain clinical measurements within normal limits will improve Outcome: Progressing Goal: Will remain free from infection Outcome: Progressing Goal: Diagnostic test results will improve Outcome: Progressing Goal: Respiratory complications will improve Outcome: Progressing Goal: Cardiovascular complication will be avoided Outcome: Progressing   Problem: Activity: Goal: Risk for activity intolerance will decrease Outcome: Progressing   Problem: Nutrition: Goal: Adequate nutrition will be maintained Outcome: Progressing   Problem: Coping: Goal: Level of anxiety will decrease Outcome: Progressing   Problem: Elimination: Goal: Will not experience complications related to bowel motility Outcome: Progressing Goal: Will not experience complications related to urinary retention Outcome: Progressing   Problem: Pain Managment: Goal: General experience of comfort will improve Outcome: Progressing   Problem: Safety: Goal: Ability to remain free from injury will improve Outcome: Progressing   Problem: Skin Integrity: Goal: Risk for impaired skin integrity will decrease Outcome: Progressing   Problem: Safety: Goal: Non-violent Restraint(s) Outcome: Progressing   Problem: Activity: Goal: Ability to tolerate increased activity will improve Outcome: Progressing   Problem: Respiratory: Goal: Ability to maintain a clear airway and adequate ventilation will improve Outcome: Progressing   Problem: Role Relationship: Goal: Method of communication will improve Outcome: Progressing   

## 2023-01-19 NOTE — Progress Notes (Signed)
eLink Physician-Brief Progress Note Patient Name: Jermaine [name] DOB: 1965-10-15 MRN: [id number]   Date of Service  01/19/2023  HPI/Events of Note  Hypertension with SBP 188-190. Has been hypertensive since this afternoon and BP has been rising. Is on precedex and versed drips, not agitated on camera, was given PRN fentanyl dose also which did not change. Do not see  history of HTN documented but not sure if he was not diagnosed earlier.   eICU Interventions  Since BP is high without him being agitated or in distress, will add hydralazine. HR is 56      Intervention Category Major Interventions: Hypertension - evaluation and management  Samreet Edenfield G Kaid Seeberger 01/19/2023, 11:03 PM

## 2023-01-19 NOTE — Procedures (Signed)
Patient Name: Shauntez Constante  MRN: [id number]  Epilepsy Attending: Charlsie Quest  Referring Physician/Provider: Caryl Pina, MD  Duration: 01/18/2023 1644 to 01/19/2023 1644  Patient history: 57yo M with seizure-like episodes with posturing in a patient who initially presented on 8/19 with alcohol withdrawal symptoms. EEG to evaluate for seizure  Level of alertness: comatose  AEDs during EEG study: Phenobarb, LEV, propofol, Versed  Technical aspects: This EEG study was done with scalp electrodes positioned according to the 10-20 International system of electrode placement. Electrical activity was reviewed with band pass filter of 1-70Hz , sensitivity of 7 uV/mm, display speed of 41mm/sec with a 60Hz  notched filter applied as appropriate. EEG data were recorded continuously and digitally stored.  Video monitoring was available and reviewed as appropriate.  Description: EEG showed continuous generalized 3 to 6 Hz theta-delta slowing admixed with an excessive amount of 15 to 18 Hz beta activity distributed symmetrically and diffusely. Hyperventilation and photic stimulation were not performed.     Event button was pressed on 01/19/2023 at 9826 and 1235 for agitation. Concomitant EEG before, during and after the event did not show any EEG changes suggest seizure.    IMPRESSION: This study was suggestive of severe diffuse encephalopathy, likely related to sedation. No seizures or epileptiform discharges were seen throughout the recording.  Event button was pressed on 01/19/2023 at 9826 and 1235 for agitation without concomitant EEG change. These events were NOT epileptic.    Jayana Kotula [name]

## 2023-01-20 ENCOUNTER — Inpatient Hospital Stay (HOSPITAL_COMMUNITY): Payer: BLUE CROSS/BLUE SHIELD

## 2023-01-20 DIAGNOSIS — F10931 Alcohol use, unspecified with withdrawal delirium: Secondary | ICD-10-CM | POA: Diagnosis not present

## 2023-01-20 DIAGNOSIS — R4182 Altered mental status, unspecified: Secondary | ICD-10-CM | POA: Diagnosis not present

## 2023-01-20 DIAGNOSIS — R410 Disorientation, unspecified: Secondary | ICD-10-CM

## 2023-01-20 DIAGNOSIS — R569 Unspecified convulsions: Secondary | ICD-10-CM | POA: Diagnosis not present

## 2023-01-20 DIAGNOSIS — J9611 Chronic respiratory failure with hypoxia: Secondary | ICD-10-CM | POA: Diagnosis not present

## 2023-01-20 LAB — MAGNESIUM: Magnesium: 2.2 mg/dL (ref 1.7–2.4)

## 2023-01-20 LAB — CBC
HCT: 42.4 % (ref 39.0–52.0)
Hemoglobin: 14.3 g/dL (ref 13.0–17.0)
MCH: 32.6 pg (ref 26.0–34.0)
MCHC: 33.7 g/dL (ref 30.0–36.0)
MCV: 96.8 fL (ref 80.0–100.0)
Platelets: 139 10*3/uL — ABNORMAL LOW (ref 150–400)
RBC: 4.38 MIL/uL (ref 4.22–5.81)
RDW: 13.9 % (ref 11.5–15.5)
WBC: 6.4 10*3/uL (ref 4.0–10.5)
nRBC: 0 % (ref 0.0–0.2)

## 2023-01-20 LAB — COMPREHENSIVE METABOLIC PANEL
ALT: 24 U/L (ref 0–44)
AST: 35 U/L (ref 15–41)
Albumin: 2.9 g/dL — ABNORMAL LOW (ref 3.5–5.0)
Alkaline Phosphatase: 43 U/L (ref 38–126)
Anion gap: 6 (ref 5–15)
BUN: 17 mg/dL (ref 6–20)
CO2: 29 mmol/L (ref 22–32)
Calcium: 8.7 mg/dL — ABNORMAL LOW (ref 8.9–10.3)
Chloride: 103 mmol/L (ref 98–111)
Creatinine, Ser: 0.88 mg/dL (ref 0.61–1.24)
GFR, Estimated: >60 mL/min (ref >60)
Glucose, Bld: 90 mg/dL (ref 70–99)
Potassium: 3.6 mmol/L (ref 3.5–5.1)
Sodium: 138 mmol/L (ref 135–145)
Total Bilirubin: 0.8 mg/dL (ref 0.3–1.2)
Total Protein: 6.4 g/dL — ABNORMAL LOW (ref 6.5–8.1)

## 2023-01-20 LAB — PHOSPHORUS: Phosphorus: 4.1 mg/dL (ref 2.5–4.6)

## 2023-01-20 LAB — TRIGLYCERIDES: Triglycerides: 131 mg/dL (ref <150)

## 2023-01-20 LAB — GLUCOSE, CAPILLARY
Glucose-Capillary: 103 mg/dL — ABNORMAL HIGH (ref 70–99)
Glucose-Capillary: 112 mg/dL — ABNORMAL HIGH (ref 70–99)
Glucose-Capillary: 123 mg/dL — ABNORMAL HIGH (ref 70–99)
Glucose-Capillary: 137 mg/dL — ABNORMAL HIGH (ref 70–99)
Glucose-Capillary: 91 mg/dL (ref 70–99)
Glucose-Capillary: 95 mg/dL (ref 70–99)

## 2023-01-20 MED ORDER — HALOPERIDOL LACTATE 5 MG/ML IJ SOLN
INTRAMUSCULAR | Status: AC
  Administered 2023-01-20: 10 mg
  Filled 2023-01-20: qty 2

## 2023-01-20 MED ORDER — QUETIAPINE FUMARATE 50 MG PO TABS: 50.0000 mg | ORAL_TABLET | Freq: Two times a day (BID) | ORAL | Status: DC

## 2023-01-20 MED ORDER — ESCITALOPRAM OXALATE 10 MG PO TABS
20.0000 mg | ORAL_TABLET | Freq: Every day | ORAL | Status: DC
  Administered 2023-01-20 – 2023-01-26 (×7): 20 mg
  Filled 2023-01-20 (×8): qty 2

## 2023-01-20 MED ORDER — QUETIAPINE FUMARATE 100 MG PO TABS
100.0000 mg | ORAL_TABLET | Freq: Two times a day (BID) | ORAL | Status: DC
  Administered 2023-01-20 – 2023-01-26 (×12): 100 mg
  Filled 2023-01-20 (×12): qty 1

## 2023-01-20 MED ORDER — MIDAZOLAM HCL 2 MG/2ML IJ SOLN
2.0000 mg | INTRAMUSCULAR | Status: DC | PRN
  Administered 2023-01-20 – 2023-01-21 (×4): 2 mg via INTRAVENOUS

## 2023-01-20 MED ORDER — POTASSIUM CHLORIDE 20 MEQ PO PACK
40.0000 meq | PACK | ORAL | Status: AC
  Administered 2023-01-20 (×2): 40 meq
  Filled 2023-01-20 (×2): qty 2

## 2023-01-20 MED ORDER — MIDAZOLAM HCL 2 MG/2ML IJ SOLN
2.0000 mg | INTRAMUSCULAR | Status: DC | PRN
  Administered 2023-01-21 – 2023-01-24 (×5): 2 mg via INTRAVENOUS
  Filled 2023-01-20 (×3): qty 2

## 2023-01-20 MED ORDER — PHENOBARBITAL SODIUM 130 MG/ML IJ SOLN
130.0000 mg | Freq: Four times a day (QID) | INTRAMUSCULAR | Status: DC
  Administered 2023-01-20 – 2023-01-25 (×20): 130 mg via INTRAVENOUS
  Filled 2023-01-20 (×20): qty 1

## 2023-01-20 NOTE — Progress Notes (Signed)
eLink Physician-Brief Progress Note Patient Name: Jermaine [name] DOB: [birth year] MRN: [id number]   Date of Service  01/20/2023  HPI/Events of Note  57 year old male initially admitted on alcohol withdrawal requiring mechanical ventilation.  Patient is currently receiving phenobarbital, Versed, and is persistently agitated and restless.  Maximized on 6 of Versed and Precedex at 1.2.  eICU Interventions  Increase Versed maximum to 10 per standard order.  Increase Versed as needed to nightly hours as needed   0400 -persistent agitation refractory to changes in.  Requiring 4 point restraints-order updated.  Intervention Category Minor Interventions: Agitation / anxiety - evaluation and management  Aliyyah Riese 01/20/2023, 11:01 PM

## 2023-01-20 NOTE — Progress Notes (Signed)
Subjective: Intubated and sedated. LTM EEG leads in place. Agitation occurred during tapering of sedation, requiring Haldol administration.   Objective: Current vital signs: BP 130/77   Pulse 69   Temp 98.4 F (36.9 C)   Resp 20   Ht 5\' 10"  (1.778 m)   Wt 101 kg   SpO2 96%   BMI 31.95 kg/m  Vital signs in last 24 hours: Temp:  [98.1 F (36.7 C)-101.3 F (38.5 C)] 98.4 F (36.9 C) (08/25 0700) Pulse Rate:  [48-111] 69 (08/25 0700) Resp:  [6-31] 20 (08/25 0700) BP: (95-194)/(61-117) 130/77 (08/25 0700) SpO2:  [91 %-97 %] 96 % (08/25 0700) FiO2 (%):  [40 %] 40 % (08/25 0400) Weight:  [101 kg] 101 kg (08/25 0500)  Intake/Output from previous day: 08/24 0701 - 08/25 0700 In: 2674.6 [P.O.:100; I.V.:1066.9; NG/GT:1107.7; IV Piggyback:400] Out: 7010 [Urine:7010] Intake/Output this shift: No intake/output data recorded. Nutritional status:  Diet Order     None       Physical Exam  HEENT:  [place]/AT. EEG leads in place. No neck stiffness Lungs: Intubated and on sedation Extremities: Warm and well perfused.    Neurological Examination Mental Status: Intubated and sedated on Versed at a rate of 6 mg/hr, Precedex at a rate of 1.2. Was recently administered Haldol. Eyes are closed, does not open eyes to stimuli. He does not follow commands. Does not localize to noxious stimuli, but does move extremities weakly in agitated fashion.  Cranial Nerves: II: Visual fields tested with no blink to threat bilaterally. Pupils 2 mm bilaterally and sluggish.    III,IV, VI: Eyes are midline without forced gaze deviation. No nystagmus.  V, VII: Does not blink to eyelash stimulation.  VIII: Unable to assess IX,X: Unable to assess  XI: Unable to assess XII: Unable to assess  Motor/Sensory: Decreased tone x 4 in the context of sedation  Unable to formally assess strength in the context of sedation. Weakly moves all 4 extremities in agitated fashion to noxious stimulation, without asymmetry.  The movements are nonpurposeful.   Reflexes: Unremarkable reflex exam in the context of sedation Cerebellar/Gait: Unable to assess   Lab Results: Results for orders placed or performed during the hospital encounter of 01/14/23 (from the past 48 hour(s))  Glucose, capillary     Status: Abnormal   Collection Time: 01/18/23  8:32 AM  Result Value Ref Range   Glucose-Capillary 111 (H) 70 - 99 mg/dL    Comment: Glucose reference range applies only to samples taken after fasting for at least 8 hours.   Comment 1 Notify RN    Comment 2 Document in Chart   Glucose, capillary     Status: None   Collection Time: 01/18/23  9:06 AM  Result Value Ref Range   Glucose-Capillary 93 70 - 99 mg/dL    Comment: Glucose reference range applies only to samples taken after fasting for at least 8 hours.  Blood gas, arterial     Status: None   Collection Time: 01/18/23  9:50 AM  Result Value Ref Range   FIO2 40% %   O2 Content 40% L/min   Delivery systems VENTILATOR    Mode PRESSURE REGULATED VOLUME CONTROL    pH, Arterial 7.4 7.35 - 7.45   pCO2 arterial 38 32 - 48 mmHg   pO2, Arterial 96 83 - 108 mmHg   Bicarbonate 23.5 20.0 - 28.0 mmol/L   Acid-base deficit 1.1 0.0 - 2.0 mmol/L   O2 Saturation 99.5 %   Patient  temperature 37.0    Collection site RIGHT RADIAL    Allens test (pass/fail) PASS PASS    Comment: Performed at [facility], [address] [place], [place] [address]  Glucose, capillary     Status: None   Collection Time: 01/18/23 11:31 AM  Result Value Ref Range   Glucose-Capillary 91 70 - 99 mg/dL    Comment: Glucose reference range applies only to samples taken after fasting for at least 8 hours.  Glucose, capillary     Status: Abnormal   Collection Time: 01/18/23  3:34 PM  Result Value Ref Range   Glucose-Capillary 123 (H) 70 - 99 mg/dL    Comment: Glucose reference range applies only to samples taken after fasting for at least 8 hours.  Magnesium     Status:  Abnormal   Collection Time: 01/18/23  6:10 PM  Result Value Ref Range   Magnesium 2.5 (H) 1.7 - 2.4 mg/dL    Comment: Performed at [facility], [address], [place], [place] [address]  Phosphorus     Status: None   Collection Time: 01/18/23  6:10 PM  Result Value Ref Range   Phosphorus 3.0 2.5 - 4.6 mg/dL    Comment: Performed at [facility], [address], [place], [place] [address]  Glucose, capillary     Status: Abnormal   Collection Time: 01/18/23  7:25 PM  Result Value Ref Range   Glucose-Capillary 126 (H) 70 - 99 mg/dL    Comment: Glucose reference range applies only to samples taken after fasting for at least 8 hours.  Glucose, capillary     Status: Abnormal   Collection Time: 01/18/23 11:17 PM  Result Value Ref Range   Glucose-Capillary 105 (H) 70 - 99 mg/dL    Comment: Glucose reference range applies only to samples taken after fasting for at least 8 hours.  Glucose, capillary     Status: None   Collection Time: 01/19/23  3:14 AM  Result Value Ref Range   Glucose-Capillary 88 70 - 99 mg/dL    Comment: Glucose reference range applies only to samples taken after fasting for at least 8 hours.  CBC     Status: Abnormal   Collection Time: 01/19/23  6:10 AM  Result Value Ref Range   WBC 5.9 4.0 - 10.5 K/uL   RBC 3.97 (L) 4.22 - 5.81 MIL/uL   Hemoglobin 13.0 13.0 - 17.0 g/dL   HCT 95.6 (L) 38.7 - 56.4 %   MCV 97.0 80.0 - 100.0 fL   MCH 32.7 26.0 - 34.0 pg   MCHC 33.8 30.0 - 36.0 g/dL   RDW 33.2 95.1 - 88.4 %   Platelets 101 (L) 150 - 400 K/uL    Comment: Immature Platelet Fraction may be clinically indicated, consider ordering this additional test [id number] REPEATED TO VERIFY    nRBC 0.0 0.0 - 0.2 %    Comment: Performed at [facility], [address], [place], [place] [address]  Basic metabolic panel     Status: Abnormal   Collection Time: 01/19/23  6:10 AM  Result Value Ref Range   Sodium 139 135 - 145 mmol/L   Potassium 4.3 3.5 - 5.1  mmol/L   Chloride 109 98 - 111 mmol/L   CO2 22 22 - 32 mmol/L   Glucose, Bld 85 70 - 99 mg/dL    Comment: Glucose reference range applies only to samples taken after fasting for at least 8 hours.  BUN 15 6 - 20 mg/dL   Creatinine, Ser 6.96 0.61 - 1.24 mg/dL   Calcium 7.9 (L) 8.9 - 10.3 mg/dL   GFR, Estimated >29 >52 mL/min    Comment: (NOTE) Calculated using the CKD-EPI Creatinine Equation (2021)    Anion gap 8 5 - 15    Comment: Performed at [facility], [address], [place], [place] [address]  Magnesium     Status: None   Collection Time: 01/19/23  6:10 AM  Result Value Ref Range   Magnesium 2.4 1.7 - 2.4 mg/dL    Comment: Performed at [facility], [address], [place], [place] [address]  Phosphorus     Status: None   Collection Time: 01/19/23  6:10 AM  Result Value Ref Range   Phosphorus 3.1 2.5 - 4.6 mg/dL    Comment: Performed at [facility], [address] [place], [place] [address]  Glucose, capillary     Status: None   Collection Time: 01/19/23  7:31 AM  Result Value Ref Range   Glucose-Capillary 80 70 - 99 mg/dL    Comment: Glucose reference range applies only to samples taken after fasting for at least 8 hours.  Glucose, capillary     Status: None   Collection Time: 01/19/23 11:28 AM  Result Value Ref Range   Glucose-Capillary 87 70 - 99 mg/dL    Comment: Glucose reference range applies only to samples taken after fasting for at least 8 hours.  Glucose, capillary     Status: Abnormal   Collection Time: 01/19/23  3:38 PM  Result Value Ref Range   Glucose-Capillary 113 (H) 70 - 99 mg/dL    Comment: Glucose reference range applies only to samples taken after fasting for at least 8 hours.  Magnesium     Status: None   Collection Time: 01/19/23  5:43 PM  Result Value Ref Range   Magnesium 2.3 1.7 - 2.4 mg/dL    Comment: Performed at [facility], [address], [place], [place] [address]  Phosphorus     Status: None   Collection  Time: 01/19/23  5:43 PM  Result Value Ref Range   Phosphorus 4.3 2.5 - 4.6 mg/dL    Comment: Performed at [facility], [address], [place], [place] [address]  Glucose, capillary     Status: Abnormal   Collection Time: 01/19/23  7:30 PM  Result Value Ref Range   Glucose-Capillary 146 (H) 70 - 99 mg/dL    Comment: Glucose reference range applies only to samples taken after fasting for at least 8 hours.  Glucose, capillary     Status: Abnormal   Collection Time: 01/19/23 11:36 PM  Result Value Ref Range   Glucose-Capillary 128 (H) 70 - 99 mg/dL    Comment: Glucose reference range applies only to samples taken after fasting for at least 8 hours.  Glucose, capillary     Status: Abnormal   Collection Time: 01/20/23  3:22 AM  Result Value Ref Range   Glucose-Capillary 137 (H) 70 - 99 mg/dL    Comment: Glucose reference range applies only to samples taken after fasting for at least 8 hours.  Triglycerides     Status: None   Collection Time: 01/20/23  6:30 AM  Result Value Ref Range   Triglycerides 131 <150 mg/dL    Comment: Performed at [facility], [address] [place], [place] [address]  Magnesium  Status: None   Collection Time: 01/20/23  6:30 AM  Result Value Ref Range   Magnesium 2.2 1.7 - 2.4 mg/dL    Comment: Performed at [facility], [address], [place], [place] [address]  Phosphorus     Status: None   Collection Time: 01/20/23  6:30 AM  Result Value Ref Range   Phosphorus 4.1 2.5 - 4.6 mg/dL    Comment: Performed at [facility], [address], [place], [place] [address]  Comprehensive metabolic panel     Status: Abnormal   Collection Time: 01/20/23  6:30 AM  Result Value Ref Range   Sodium 138 135 - 145 mmol/L   Potassium 3.6 3.5 - 5.1 mmol/L   Chloride 103 98 - 111 mmol/L   CO2 29 22 - 32 mmol/L   Glucose, Bld 90 70 - 99 mg/dL    Comment: Glucose reference range applies only to samples taken after fasting for at least 8 hours.    BUN 17 6 - 20 mg/dL   Creatinine, Ser 0.98 0.61 - 1.24 mg/dL   Calcium 8.7 (L) 8.9 - 10.3 mg/dL   Total Protein 6.4 (L) 6.5 - 8.1 g/dL   Albumin 2.9 (L) 3.5 - 5.0 g/dL   AST 35 15 - 41 U/L   ALT 24 0 - 44 U/L   Alkaline Phosphatase 43 38 - 126 U/L   Total Bilirubin 0.8 0.3 - 1.2 mg/dL   GFR, Estimated >11 >91 mL/min    Comment: (NOTE) Calculated using the CKD-EPI Creatinine Equation (2021)    Anion gap 6 5 - 15    Comment: Performed at [facility], [address], [place], [place] [address]  CBC     Status: Abnormal   Collection Time: 01/20/23  6:30 AM  Result Value Ref Range   WBC 6.4 4.0 - 10.5 K/uL   RBC 4.38 4.22 - 5.81 MIL/uL   Hemoglobin 14.3 13.0 - 17.0 g/dL   HCT 56.2 13.0 - 86.5 %   MCV 96.8 80.0 - 100.0 fL   MCH 32.6 26.0 - 34.0 pg   MCHC 33.7 30.0 - 36.0 g/dL   RDW 78.4 69.6 - 29.5 %   Platelets 139 (L) 150 - 400 K/uL   nRBC 0.0 0.0 - 0.2 %    Comment: Performed at [facility], [address] [place], [place] [address]    Recent Results (from the past 240 hour(s))  MRSA Next Gen by PCR, Nasal     Status: None   Collection Time: 01/15/23 10:27 AM   Specimen: Nasal Mucosa; Nasal Swab  Result Value Ref Range Status   MRSA by PCR Next Gen NOT DETECTED NOT DETECTED Final    Comment: (NOTE) The GeneXpert MRSA Assay (FDA approved for NASAL specimens only), is one component of a comprehensive MRSA colonization surveillance program. It is not intended to diagnose MRSA infection nor to guide or monitor treatment for MRSA infections. Test performance is not FDA approved in patients less than 26 years old. Performed at [facility], [address] [place], [place] [address]   SARS Coronavirus 2 by RT PCR (hospital order, performed in [facility] hospital lab) *cepheid single result test* Anterior Nasal Swab     Status: None   Collection Time: 01/15/23  6:02 PM   Specimen: Anterior Nasal Swab  Result Value Ref Range Status   SARS  Coronavirus 2 by RT PCR NEGATIVE NEGATIVE Final  Comment: (NOTE) SARS-CoV-2 target nucleic acids are NOT DETECTED.  The SARS-CoV-2 RNA is generally detectable in upper and lower respiratory specimens during the acute phase of infection. The lowest concentration of SARS-CoV-2 viral copies this assay can detect is 250 copies / mL. A negative result does not preclude SARS-CoV-2 infection and should not be used as the sole basis for treatment or other patient management decisions.  A negative result may occur with improper specimen collection / handling, submission of specimen other than nasopharyngeal swab, presence of viral mutation(s) within the areas targeted by this assay, and inadequate number of viral copies (<250 copies / mL). A negative result must be combined with clinical observations, patient history, and epidemiological information.  Fact Sheet for Patients:   RoadLapTop.co.za  Fact Sheet for Healthcare Providers: http://kim-miller.com/  This test is not yet approved or  cleared by the [place] FDA and has been authorized for detection and/or diagnosis of SARS-CoV-2 by FDA under an Emergency Use Authorization (EUA).  This EUA will remain in effect (meaning this test can be used) for the duration of the COVID-19 declaration under Section 564(b)(1) of the Act, 21 U.S.C. section 360bbb-3(b)(1), unless the authorization is terminated or revoked sooner.  Performed at [facility], [address], [place], [place] [address]   Culture, Respiratory w Gram Stain     Status: None   Collection Time: 01/16/23  9:12 AM   Specimen: Tracheal Aspirate; Respiratory  Result Value Ref Range Status   Specimen Description   Final    TRACHEAL ASPIRATE Performed at [facility], [address], [place], [place] [address]    Special Requests   Final    NONE Performed at [facility],  [address] [place], [place] [address]    Gram Stain   Final    FEW WBC PRESENT, PREDOMINANTLY PMN RARE GRAM POSITIVE RODS    Culture   Final    RARE Normal respiratory flora-no Staph aureus or Pseudomonas seen Performed at [facility], [address], [place], [place] [address]    Report Status 01/18/2023 FINAL  Final  Culture, blood (Routine X 2) w Reflex to ID Panel     Status: None (Preliminary result)   Collection Time: 01/17/23 10:23 AM   Specimen: BLOOD RIGHT ARM  Result Value Ref Range Status   Specimen Description   Final    BLOOD RIGHT ARM AEROBIC BOTTLE ONLY ANAEROBIC BOTTLE ONLY Performed at [facility], [address], [place], [place] [address]    Special Requests   Final    BOTTLES DRAWN AEROBIC AND ANAEROBIC Blood Culture adequate volume Performed at [facility], [address], [place], [place] [address]    Culture   Final    NO GROWTH 2 DAYS Performed at [facility], [address], [place], [place] [address]    Report Status PENDING  Incomplete  Culture, blood (Routine X 2) w Reflex to ID Panel     Status: None (Preliminary result)   Collection Time: 01/17/23 10:23 AM   Specimen: BLOOD RIGHT HAND  Result Value Ref Range Status   Specimen Description   Final    BLOOD RIGHT HAND AEROBIC BOTTLE ONLY ANAEROBIC BOTTLE ONLY Performed at [facility], [address], [place], [place] [address]    Special Requests   Final    BOTTLES DRAWN AEROBIC AND ANAEROBIC Blood Culture adequate volume Performed at [facility], [address] [place], [place] [address]    Culture   Final    NO GROWTH 2 DAYS Performed at [facility], [address], [place], [place] [address]    Report Status PENDING  Incomplete  Urine Culture (for pregnant, neutropenic or urologic patients or patients with an indwelling urinary catheter)     Status: None   Collection Time: 01/17/23  10:35 AM   Specimen: Urine, Catheterized  Result Value Ref Range Status   Specimen Description   Final    URINE, CATHETERIZED Performed at [facility], [address] [place], [place] [address]    Special Requests   Final    NONE Performed at [facility], [address], [place], [place] [address]    Culture   Final    NO GROWTH Performed at [facility], [address] [place], [place] [address]    Report Status 01/19/2023 FINAL  Final    Lipid Panel Recent Labs    01/20/23 0630  TRIG 131    Studies/Results: DG Chest Port 1 View  Result Date: 01/19/2023 CLINICAL DATA:  Respiratory failure EXAM: PORTABLE CHEST 1 VIEW COMPARISON:  01/17/2023 FINDINGS: The patient is rotated to the right on today's radiograph, reducing diagnostic sensitivity and specificity. Endotracheal tube tip is 3.8 cm above the carina. Nasogastric tube terminates in the stomach antrum. Low lung volumes are present, causing crowding of the pulmonary vasculature. Mild scarring or atelectasis in the right mid lung. Prominent main pulmonary artery, raising the possibility of pulmonary arterial hypertension. Borderline elevation of the right hemidiaphragm. No blunting of the costophrenic angles. IMPRESSION: 1. Endotracheal tube tip 3.8 cm above the carina. Nasogastric tube terminates in the stomach antrum. 2. Low lung volumes with mild scarring or atelectasis in the right mid lung. 3. Prominent main pulmonary artery, raising the possibility of pulmonary arterial hypertension. Electronically Signed   By: Gaylyn Rong M.D.   On: 01/19/2023 14:36   Overnight EEG with video  Result Date: 01/19/2023 Charlsie Quest, MD     01/20/2023  5:59 AM Patient Name: Jermaine [name] MRN: [id number] Epilepsy Attending: Charlsie Quest Referring Physician/Provider: Caryl Pina, MD Duration: 01/18/2023 1644 to 01/19/2023 1644 Patient history: 57yo M with seizure-like episodes with  posturing in a patient who initially presented on 8/19 with alcohol withdrawal symptoms. EEG to evaluate for seizure Level of alertness: comatose AEDs during EEG study: Phenobarb, LEV, propofol, Versed Technical aspects: This EEG study was done with scalp electrodes positioned according to the 10-20 International system of electrode placement. Electrical activity was reviewed with band pass filter of 1-70Hz , sensitivity of 7 uV/mm, display speed of 62mm/sec with a 60Hz  notched filter applied as appropriate. EEG data were recorded continuously and digitally stored.  Video monitoring was available and reviewed as appropriate. Description: EEG showed continuous generalized 3 to 6 Hz theta-delta slowing admixed with an excessive amount of 15 to 18 Hz beta activity distributed symmetrically and diffusely. Hyperventilation and photic stimulation were not performed.   Event button was pressed on 01/19/2023 at 9826 and 1235 for agitation. Concomitant EEG before, during and after the event did not show any EEG changes suggest seizure.  IMPRESSION: This study was suggestive of severe diffuse encephalopathy, likely related to sedation. No seizures or epileptiform discharges were seen throughout the recording. Event button was pressed on 01/19/2023 at 9826 and 1235 for agitation without concomitant EEG change. These events were NOT epileptic.  Priyanka [name]  Rapid EEG  Result Date: 01/18/2023 Charlsie Quest, MD     01/18/2023 11:24 AM Patient Name: Jermaine [name] MRN: [id number] Epilepsy Attending: Charlsie Quest Referring Physician/Provider: Caryl Pina, MD Duration: 44.58 mins  Patient history: 57yo Fpt having generalized tonic clonic movements with right sided decorticate posturing. EEG to evaluate for seizure.  Level of alertness: comatose  AEDs during EEG study: Phenobarb, propofol, versed  Technical aspects: This EEG was obtained using a 10 lead EEG system positioned circumferentially without any parasagittal  coverage (rapid EEG). Computer selected EEG is reviewed as  well as background features and all clinically significant events.  Description: EEG showed continuous generalized 3 to 6 Hz theta-delta slowing admixed with an excessive amount of 15 to 18 Hz beta activity distributed symmetrically and diffusely. Hyperventilation and photic stimulation were not performed.    IMPRESSION: This limited ceribell EEG was suggestive of severe diffuse encephalopathy, likely related to sedation. No seizures or epileptiform discharges were seen throughout the recording.  If suspicion for interictal activity remains a concern, a conventional EEG be considered.  Charlsie Quest   ECHOCARDIOGRAM COMPLETE  Result Date: 01/18/2023    ECHOCARDIOGRAM REPORT   Patient Name:   Jermaine [name] Date of Exam: 01/18/2023 Medical Rec #:  [id number]     Height:       70.0 in Accession #:    [id number]    Weight:       211.0 lb Date of Birth:  Jan 28, 1966     BSA:          2.135 m Patient Age:    57 years      BP:           125/84 mmHg Patient Gender: M             HR:           103 bpm. Exam Location:  Inpatient Procedure: 2D Echo, Color Doppler, Cardiac Doppler and Intracardiac            Opacification Agent Indications:    Hypotension  History:        Patient has no prior history of Echocardiogram examinations.                 Risk Factors:Obesity. ETOH, alcohol withdrawal.  Sonographer:    Milda Smart Referring Phys: [id number] PAULA B SIMPSON  Sonographer Comments: Echo performed with patient supine and on artificial respirator. Image acquisition challenging due to patient body habitus and Image acquisition challenging due to respiratory motion. IMPRESSIONS  1. Left ventricular ejection fraction, by estimation, is 65 to 70%. The left ventricle has normal function. The left ventricle has no regional wall motion abnormalities. Left ventricular diastolic parameters were normal.  2. Right ventricular systolic function is normal. The right  ventricular size is normal.  3. The mitral valve is normal in structure. Trivial mitral valve regurgitation. No evidence of mitral stenosis.  4. The aortic valve is normal in structure. Aortic valve regurgitation is not visualized. No aortic stenosis is present.  5. The inferior vena cava is normal in size with greater than 50% respiratory variability, suggesting right atrial pressure of 3 mmHg.  6. Technically limited study due to poor sound wave transmission. FINDINGS  Left Ventricle: Left ventricular ejection fraction, by estimation, is 65 to 70%. The left ventricle has normal function. The left ventricle has no regional wall motion abnormalities. Definity contrast agent was given IV to delineate the left ventricular  endocardial borders. The left ventricular  internal cavity size was normal in size. There is no left ventricular hypertrophy. Left ventricular diastolic parameters were normal. Right Ventricle: The right ventricular size is normal. No increase in right ventricular wall thickness. Right ventricular systolic function is normal. Left Atrium: Left atrial size was normal in size. Right Atrium: Right atrial size was normal in size. Pericardium: There is no evidence of pericardial effusion. Mitral Valve: The mitral valve is normal in structure. Trivial mitral valve regurgitation. No evidence of mitral valve stenosis. Tricuspid Valve: The tricuspid valve is normal in structure. Tricuspid valve regurgitation is trivial. No evidence of tricuspid stenosis. Aortic Valve: The aortic valve is normal in structure. Aortic valve regurgitation is not visualized. No aortic stenosis is present. Pulmonic Valve: The pulmonic valve was normal in structure. Pulmonic valve regurgitation is not visualized. No evidence of pulmonic stenosis. Aorta: The aortic root is normal in size and structure. Venous: The inferior vena cava is normal in size with greater than 50% respiratory variability, suggesting right atrial pressure of 3  mmHg. IAS/Shunts: No atrial level shunt detected by color flow Doppler.  LEFT VENTRICLE PLAX 2D LVIDd:         5.10 cm   Diastology LVIDs:         4.10 cm   LV e' medial:  8.05 cm/s LV PW:         1.00 cm   LV e' lateral: 11.30 cm/s LV IVS:        1.00 cm LVOT diam:     2.50 cm LV SV:         134 LV SV Index:   63 LVOT Area:     4.91 cm  RIGHT VENTRICLE RV S prime:     23.20 cm/s TAPSE (M-mode): 2.6 cm LEFT ATRIUM             Index        RIGHT ATRIUM           Index LA diam:        3.00 cm 1.40 cm/m   RA Area:     18.90 cm LA Vol (A2C):   52.6 ml 24.63 ml/m  RA Volume:   60.50 ml  28.33 ml/m LA Vol (A4C):   43.6 ml 20.42 ml/m LA Biplane Vol: 48.0 ml 22.48 ml/m  AORTIC VALVE LVOT Vmax:   154.00 cm/s LVOT Vmean:  116.000 cm/s LVOT VTI:    0.272 m  AORTA Ao Root diam: 3.70 cm Ao Asc diam:  3.70 cm  SHUNTS Systemic VTI:  0.27 m Systemic Diam: 2.50 cm Arvilla Meres MD Electronically signed by Arvilla Meres MD Signature Date/Time: 01/18/2023/9:48:44 AM    Final     Medications: Scheduled:  arformoterol  15 mcg Nebulization BID   budesonide (PULMICORT) nebulizer solution  0.5 mg Nebulization BID   Chlorhexidine Gluconate Cloth  6 each Topical Daily   docusate  100 mg Per Tube BID   enoxaparin (LOVENOX) injection  40 mg Subcutaneous Q24H   folic acid  1 mg Per Tube Daily   guaiFENesin  15 mL Per Tube Q4H   methylPREDNISolone (SOLU-MEDROL) injection  40 mg Intravenous Daily   multivitamin with minerals  1 tablet Per Tube Daily   mouth rinse  15 mL Mouth Rinse Q2H   oxyCODONE  10 mg Per Tube Q6H   pantoprazole (PROTONIX) IV  40 mg Intravenous Daily   PHENObarbital  130 mg Intravenous Q8H   polyethylene glycol  17 g Per Tube Daily  thiamine  100 mg Per Tube Daily   Continuous:  ampicillin-sulbactam (UNASYN) IV Stopped (01/20/23 0140)   dexmedetomidine (PRECEDEX) IV infusion 1.2 mcg/kg/hr (01/20/23 0707)   feeding supplement (VITAL AF 1.2 CAL) 65 mL/hr at 01/20/23 0700   midazolam 13 mg/hr  (01/20/23 0700)   norepinephrine (LEVOPHED) Adult infusion Stopped (01/18/23 1701)    Assessment: Inpatient seizure-like episodes with posturing in a 57 year old male who initially presented on 8/19 with alcohol withdrawal symptoms - Exam reveals a sedated and intubated patient without posturing or seizure-like activity.  - Imaging was unremarkable:  - CT head and neck: No acute intracranial abnormality. No acute cervical spine fracture or traumatic listhesis.  - MRI brain: Unremarkable brain MRI. No evidence of acute abnormality.  - EEGs: - Initial Ceribell rapid EEG (8/22): This limited ceribell EEG was initially within normal limits. Gradually as sedation was adjusted, EEG was suggestive of severe diffuse encephalopathy, likely related to sedation. No seizures or epileptiform discharges were seen throughout the recording.  - Subsequent Ceribell rapid EEG (8/23): This limited ceribell EEG was suggestive of severe diffuse encephalopathy, likely related to sedation. No seizures or epileptiform discharges were seen throughout the recording. - LTM EEG for Saturday AM (8/24): EEG showed continuous generalized 3 to 6 Hz theta-delta slowing admixed with an excessive amount of 15 to 18 Hz beta activity distributed symmetrically and diffusely. This study was suggestive of severe diffuse encephalopathy, likely related to sedation. No seizures or epileptiform discharges were seen throughout the recording. - LTM EEG report for today (01/19/2023 1644 to 01/20/2023 0600): EEG showed continuous generalized 3 to 6 Hz theta-delta slowing admixed with an excessive amount of 15 to 18 Hz beta activity distributed symmetrically and diffusely. This study was suggestive of severe diffuse encephalopathy, likely related to sedation. No seizures or epileptiform discharges were seen throughout the recording (findings essentially unchanged from 8/24 LTM EEG report).  - Overall impression: Severe alcohol withdrawal syndrome  requiring high levels of sedating medications. Had possible clinical withdrawal seizures during initial ICU stay at [facility], but subsequent EEGs have been negative for electrographic seizure activity. The movements described more likely were due to agitation in the setting of insufficient sedation in this patient with unusually high EtOH intake, even for an EtOH abuser.    Recommendations: - Inpatient seizure precautions  - Continue LTM EEG  - Continue phenobarb taper - Now also on Librium - Discontinuing IV Keppra.  - Titrate Versed to agitation and possible electrographic seizure activity.  - Attempt to wean off Precedex gtt - Discussed with CCM - Neurology will continue to follow   35 minutes spent in the neurological evaluation and management of this critically ill patient   LOS: 6 days   @Electronically  signed: Dr. Caryl Pina 01/20/2023  7:40 AM

## 2023-01-20 NOTE — Progress Notes (Signed)
NAME:  Jermaine [name], MRN:  [id number], DOB:  May 11, 1966, LOS: 6 ADMISSION DATE:  01/14/2023, CONSULTATION DATE:  01/16/23 REFERRING MD:  TRH, CHIEF COMPLAINT:  ETOH withdrawal   History of Present Illness:  57 year old male with PMH of anxiety, depression, and ETOH abuse who presented 8/19 after reports of binge drinking for the last 2-3 months and decided to quit.  Last ETOH 8/19.  Admitted to [facility] in active ETOH withdrawal, ETOH 342 on admit, started on withdrawal protocol, however worsening agitation and secretions overnight 8/20, requiring 4L [place].  Was started on precedex but CIWAs remained 18-43.  Phenobarbital taper ordered by but not able to be given while NPO.  PCCM consulted for further evaluation.  Required intubation for respiratory secretions and airway protection.   Pertinent  Medical History   Past Medical History:  Diagnosis Date   Alcohol abuse    Anxiety    Class 1 obesity 09/14/2021   Depression     Significant Hospital Events: Including procedures, antibiotic start and stop dates in addition to other pertinent events   8/19 admitted ETOH withdrawal 8/21 PCCM consult, intubated, fever, abx for aspiration 8/22 mucous plugging, seizure like activity, CTH/ MRI brain, ceribell neg 8/23 seen by neurology.  All EEGs negative.  No evidence of seizure.  Felt to be primarily metabolic encephalopathy 8/24 EEGs all negative for seizure.  Weaning fentanyl to off, weaning Versed, phenobarb dose adjusted 8/25 completing versed wean (increased to more rapid wean), cont scheduled q 6 phenobarb. Added seroquel. Tolerating PSV working towards vent liberation  Interim History / Subjective:  More interactive. Still on versed. Weaning w/ good volumes  Objective   Blood pressure (!) 145/86, pulse 71, temperature 98.8 F (37.1 C), resp. rate 17, height 5\' 10"  (1.778 m), weight 101 kg, SpO2 95%.    Vent Mode: PSV FiO2 (%):  [40 %] 40 % Set Rate:  [20 bmp] 20 bmp Vt Set:  [580 mL] 580  mL PEEP:  [5 cmH20] 5 cmH20 Pressure Support:  [5 cmH20-10 cmH20] 10 cmH20 Plateau Pressure:  [16 cmH20] 16 cmH20   Intake/Output Summary (Last 24 hours) at 01/20/2023 1016 Last data filed at 01/20/2023 0700 Gross per 24 hour  Intake 2151.62 ml  Output 6810 ml  Net -4658.38 ml   Filed Weights   01/18/23 1137 01/19/23 0500 01/20/23 0500  Weight: 101.8 kg 101.6 kg 101 kg   Examination: General chronically ill appearing 57 year old male currently weaning from versed gtt. More awake. Will follow commands Neuro eyes open. Moves all ext. Equal st. Will reach for ETT. Intermittently follows commands Pulm some scattered rhonchi. VTs in 400-500 range on PSV 10. No accessory use.  PCXR Right mid lung atx. Ett good position    Resolved Hospital Problem list   Shock (resolved. Mixed picture of sepsis and hypovolemia)   Assessment & Plan:   Acute metabolic encephalopathy due to alcohol withdrawal with severe delirium tremens complicated further by delirium  EEG negative for seizure, neurology thinks this was all severe withdrawal CT head and MRI negative Plan Cont current phenobarb q 6; will see how he looks off versed before starting phenobarb taper  Resume lexapro Cont precedex may eventually transition to clonidine  Add seroquel to cover for possible delirium component  Change RASS goal to -1 Continue thiamine and folate Avoid fever, keep euglycemic   Acute hypoxic respiratory insufficiency  Aspiration pneumonia Mucous plugging w/ associated atelectasis  Sputum with normal flora Plan Cont PSV as  tolerated w/ hope to transition to SBT later today assuming Mental status will allow.  Suspect he will need extubation on dex.  VAP bundle PAD protocol RASS goal -1 Continue via tube Mucinex completed 3d of HT NaCl nebs Complete 5 d solumedrol (stop date placed)  Continue scheduled Brovana and Pulmicort As needed albuterol Day 5 of 5 unasyn   Intermittent fluid and electrolyte  imbalance  Plan monitor closely for refeeding syndrome given hx of ETOH abuse goal K> 4, Mag > 2; getting KCL this am    Chronic thrombocytopenia - likely from ETOH abuse - plts tend stable, no evidence of bleeding, plts better  Plan Trend cbc   At risk for malnutrition Plan TF per RD.   Possible hx of falls - no obvious signs of trauma on exam - CTH/ cervical neg Plan Monitor   Best Practice (right click and "Reselect all SmartList Selections" daily)   Diet/type: tubefeeds DVT prophylaxis: LMWH GI prophylaxis: PPI Lines: N/A Foley:  Yes, and it is still needed Code Status:  full code Last date of multidisciplinary goals of care discussion [8/22]     Critical care time: 33 min     Simonne Martinet ACNP-BC [facility] Pulmonary/Critical Care Pager # [phone] OR # [phone] if no answer

## 2023-01-20 NOTE — Progress Notes (Addendum)
Pt became very agitated and combative after reducing versed by 50%. Attempting to kick and pull ETT. Despite bilateral wrist restraints and safety mittens. Pt is not redirectable.   Anders Simmonds at [facility] and ordered 10mg  Haldol IV. QTC 462. And Bolus from bag of 2mg  versed IV. Pt still agitated but not attempting to kick at this time. Pt remains in safety mittens and Bilateral Wrist restraints.

## 2023-01-20 NOTE — Procedures (Signed)
Patient Name: Tanishq Kellough  MRN: [id number]  Epilepsy Attending: Charlsie Quest  Referring Physician/Provider: Caryl Pina, MD  Duration: 01/19/2023 1644 to 01/20/2023 1644   Patient history: 57yo M with seizure-like episodes with posturing in a patient who initially presented on 8/19 with alcohol withdrawal symptoms. EEG to evaluate for seizure   Level of alertness: comatose   AEDs during EEG study: Phenobarb, Versed   Technical aspects: This EEG study was done with scalp electrodes positioned according to the 10-20 International system of electrode placement. Electrical activity was reviewed with band pass filter of 1-70Hz , sensitivity of 7 uV/mm, display speed of 71mm/sec with a 60Hz  notched filter applied as appropriate. EEG data were recorded continuously and digitally stored.  Video monitoring was available and reviewed as appropriate.   Description: EEG showed continuous generalized 3 to 6 Hz theta-delta slowing admixed with an excessive amount of 15 to 18 Hz beta activity distributed symmetrically and diffusely. Hyperventilation and photic stimulation were not performed.       IMPRESSION: This study was suggestive of severe diffuse encephalopathy, likely related to sedation. No seizures or epileptiform discharges were seen throughout the recording.    Analiz Tvedt [name]

## 2023-01-21 ENCOUNTER — Inpatient Hospital Stay (HOSPITAL_COMMUNITY): Payer: BLUE CROSS/BLUE SHIELD

## 2023-01-21 DIAGNOSIS — F10931 Alcohol use, unspecified with withdrawal delirium: Secondary | ICD-10-CM | POA: Diagnosis not present

## 2023-01-21 DIAGNOSIS — R569 Unspecified convulsions: Secondary | ICD-10-CM | POA: Diagnosis not present

## 2023-01-21 LAB — BASIC METABOLIC PANEL
Anion gap: 15 (ref 5–15)
BUN: 22 mg/dL — ABNORMAL HIGH (ref 6–20)
CO2: 25 mmol/L (ref 22–32)
Calcium: 9.3 mg/dL (ref 8.9–10.3)
Chloride: 96 mmol/L — ABNORMAL LOW (ref 98–111)
Creatinine, Ser: 1.02 mg/dL (ref 0.61–1.24)
GFR, Estimated: >60 mL/min (ref >60)
Glucose, Bld: 136 mg/dL — ABNORMAL HIGH (ref 70–99)
Potassium: 3.8 mmol/L (ref 3.5–5.1)
Sodium: 136 mmol/L (ref 135–145)

## 2023-01-21 LAB — CBC
HCT: 42.8 % (ref 39.0–52.0)
Hemoglobin: 14.4 g/dL (ref 13.0–17.0)
MCH: 31.9 pg (ref 26.0–34.0)
MCHC: 33.6 g/dL (ref 30.0–36.0)
MCV: 94.7 fL (ref 80.0–100.0)
Platelets: 182 10*3/uL (ref 150–400)
RBC: 4.52 MIL/uL (ref 4.22–5.81)
RDW: 13.6 % (ref 11.5–15.5)
WBC: 9.6 10*3/uL (ref 4.0–10.5)
nRBC: 0 % (ref 0.0–0.2)

## 2023-01-21 LAB — GLUCOSE, CAPILLARY
Glucose-Capillary: 103 mg/dL — ABNORMAL HIGH (ref 70–99)
Glucose-Capillary: 97 mg/dL (ref 70–99)
Glucose-Capillary: 131 mg/dL — ABNORMAL HIGH (ref 70–99)
Glucose-Capillary: 88 mg/dL (ref 70–99)
Glucose-Capillary: 97 mg/dL (ref 70–99)

## 2023-01-21 LAB — EXPECTORATED SPUTUM ASSESSMENT W GRAM STAIN, RFLX TO RESP C

## 2023-01-21 LAB — MAGNESIUM: Magnesium: 2.2 mg/dL (ref 1.7–2.4)

## 2023-01-21 LAB — PHOSPHORUS: Phosphorus: 5.3 mg/dL — ABNORMAL HIGH (ref 2.5–4.6)

## 2023-01-21 LAB — MRSA NEXT GEN BY PCR, NASAL: MRSA by PCR Next Gen: NOT DETECTED

## 2023-01-21 MED ORDER — PIPERACILLIN-TAZOBACTAM 3.375 G IVPB
3.3750 g | Freq: Three times a day (TID) | INTRAVENOUS | Status: DC
  Administered 2023-01-21 – 2023-01-23 (×5): 3.375 g via INTRAVENOUS
  Filled 2023-01-21 (×5): qty 50

## 2023-01-21 MED ORDER — VANCOMYCIN HCL 2000 MG/400ML IV SOLN
2000.0000 mg | Freq: Once | INTRAVENOUS | Status: AC
  Administered 2023-01-21: 2000 mg via INTRAVENOUS
  Filled 2023-01-21: qty 400

## 2023-01-21 MED ORDER — KETAMINE HCL-SODIUM CHLORIDE 1000-0.9 MG/100ML-% IV SOLN
1.0000 mg/kg/h | INTRAVENOUS | Status: DC
  Administered 2023-01-21 – 2023-01-22 (×3): 1 mg/kg/h via INTRAVENOUS
  Filled 2023-01-21 (×3): qty 100

## 2023-01-21 MED ORDER — VANCOMYCIN HCL 1250 MG/250ML IV SOLN
1250.0000 mg | Freq: Two times a day (BID) | INTRAVENOUS | Status: DC
  Administered 2023-01-22: 1250 mg via INTRAVENOUS
  Filled 2023-01-21: qty 250

## 2023-01-21 MED ORDER — PROPOFOL 1000 MG/100ML IV EMUL
0.0000 ug/kg/min | INTRAVENOUS | Status: DC
  Administered 2023-01-21: 40 ug/kg/min via INTRAVENOUS
  Administered 2023-01-21: 45 ug/kg/min via INTRAVENOUS
  Administered 2023-01-21: 40 ug/kg/min via INTRAVENOUS
  Administered 2023-01-21: 20 ug/kg/min via INTRAVENOUS
  Administered 2023-01-22: 40 ug/kg/min via INTRAVENOUS
  Administered 2023-01-22: 10 ug/kg/min via INTRAVENOUS
  Administered 2023-01-22 (×2): 40 ug/kg/min via INTRAVENOUS
  Administered 2023-01-23 (×2): 50 ug/kg/min via INTRAVENOUS
  Administered 2023-01-23 (×2): 30 ug/kg/min via INTRAVENOUS
  Administered 2023-01-23: 40 ug/kg/min via INTRAVENOUS
  Administered 2023-01-23: 30 ug/kg/min via INTRAVENOUS
  Administered 2023-01-24: 50 ug/kg/min via INTRAVENOUS
  Filled 2023-01-21 (×16): qty 100

## 2023-01-21 MED ORDER — FUROSEMIDE 10 MG/ML IJ SOLN
40.0000 mg | Freq: Once | INTRAMUSCULAR | Status: AC
  Administered 2023-01-21: 40 mg via INTRAVENOUS
  Filled 2023-01-21: qty 4

## 2023-01-21 MED ORDER — INSULIN ASPART 100 UNIT/ML IJ SOLN
1.0000 [IU] | INTRAMUSCULAR | Status: DC
  Administered 2023-01-22: 1 [IU] via SUBCUTANEOUS

## 2023-01-21 NOTE — Progress Notes (Addendum)
NAME:  Jermaine [name], MRN:  [id number], DOB:  December 08, 1965, LOS: 7 ADMISSION DATE:  01/14/2023, CONSULTATION DATE:  01/16/23 REFERRING MD:  TRH, CHIEF COMPLAINT:  ETOH withdrawal   History of Present Illness:  57 year old male with PMH of anxiety, depression, and ETOH abuse who presented 8/19 after reports of binge drinking for the last 2-3 months and decided to quit.  Last ETOH 8/19.  Admitted to [facility] in active ETOH withdrawal, ETOH 342 on admit, started on withdrawal protocol, however worsening agitation and secretions overnight 8/20, requiring 4L .  Was started on precedex but CIWAs remained 18-43.  Phenobarbital taper ordered by but not able to be given while NPO.  PCCM consulted for further evaluation.  Required intubation for respiratory secretions and airway protection.   Pertinent  Medical History   Past Medical History:  Diagnosis Date   Alcohol abuse    Anxiety    Class 1 obesity 09/14/2021   Depression     Significant Hospital Events: Including procedures, antibiotic start and stop dates in addition to other pertinent events   8/19 admitted ETOH withdrawal 8/21 PCCM consult, intubated, fever, abx for aspiration 8/22 mucous plugging, seizure like activity, CTH/ MRI brain, ceribell neg 8/23 seen by neurology.  All EEGs negative.  No evidence of seizure.  Felt to be primarily metabolic encephalopathy 8/24 EEGs all negative for seizure.  Weaning fentanyl to off, weaning Versed, phenobarb dose adjusted 8/25 completing versed wean (increased to more rapid wean), cont scheduled q 6 phenobarb. Added seroquel. Tolerating PSV working towards vent liberation  8/26 DC precedex. Off steroids and antibiotics. Restarting propofol, continue versed wean, continue phenobarb and seroquel. Lasix 40mg  IV    Interim History / Subjective:   Continued agitation overnight. Versed gtt increased and also given PRN versed, placed in 4 point restraints.  Objective   Blood pressure 106/69, pulse 78,  temperature 99.7 F (37.6 C), resp. rate (!) 25, height 5\' 10"  (1.778 m), weight 96.8 kg, SpO2 96%.    Vent Mode: PSV;CPAP FiO2 (%):  [40 %] 40 % Set Rate:  [20 bmp] 20 bmp Vt Set:  [580 mL] 580 mL PEEP:  [5 cmH20] 5 cmH20 Pressure Support:  [10 cmH20] 10 cmH20 Plateau Pressure:  [16 cmH20-20 cmH20] 16 cmH20   Intake/Output Summary (Last 24 hours) at 01/21/2023 1041 Last data filed at 01/21/2023 1000 Gross per 24 hour  Intake 2889.3 ml  Output 3170 ml  Net -280.7 ml   Filed Weights   01/19/23 0500 01/20/23 0500 01/21/23 0447  Weight: 101.6 kg 101 kg 96.8 kg   Examination: Gen: NAD, laying flat in bed, intubated HEENT: ETT CV: RRR no murmurs Pulm: Scattered rhonchi bilaterally, comfortable WOB on vent Neuro: sedated, inconsistently follows commands - opens eyes to verbal stimulus, shakes legs when asked to wiggle toes Ext: no significant edema  CXR 8/26: Radiologist impression: 1. Support apparatus stable. 2. Low lung volumes with perihilar atelectasis, unchanged. 3. Stable mild cardiomegaly without evidence of vascular congestion or edema. Electronically Signed   By: Almira Bar M.D.   On: 01/21/2023 07:24  Resolved Hospital Problem list   Shock (resolved. Mixed picture of sepsis and hypovolemia)   Assessment & Plan:   Acute metabolic encephalopathy due to alcohol withdrawal with severe delirium tremens complicated further by delirium  EEG negative for seizure, per neurology likely 2/2 severe withdrawal CT head and MRI negative Plan Appreciate neurology Cont current phenobarb q 6 Cont versed, wean as tolerated. PRN versed for seizure  Restart propofol DC precedex Cont lexapro Cont seroquel to cover for possible delirium component  Continue thiamine and folate Avoid fever, keep euglycemic   Acute hypoxic respiratory insufficiency  Aspiration pneumonia Mucous plugging w/ associated atelectasis  Sputum with normal flora Stable CXR today Plan Lasix 40mg   IV x1 today  Cont PSV as tolerated, continues to require ventilator support due to agitation and sedative medications Repeat sputum cx VAP bundle PAD protocol RASS goal -1, PRN fentanyl Continue scheduled Brovana and Pulmicort As needed albuterol S/p 5 d solumedrol  S/p unasyn x5d, now off abx  Intermittent fluid and electrolyte imbalance  Plan monitor closely for refeeding syndrome given hx of ETOH abuse goal K> 4, Mag > 2   Chronic thrombocytopenia - likely from ETOH abuse - awaiting AM labs Plan F/u cbc   At risk for malnutrition Plan TF per RD.   Possible hx of falls - CTH/ cervical neg Plan Monitor   Best Practice (right click and "Reselect all SmartList Selections" daily)   Diet/type: tubefeeds DVT prophylaxis: LMWH GI prophylaxis: PPI Lines: N/A Foley:  Yes, and it is still needed Code Status:  full code Last date of multidisciplinary goals of care discussion [8/22]        Vonna Drafts, MD
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
## 2023-01-21 NOTE — Progress Notes (Signed)
vLTM maintenance  All impedances below 10kohm   No skin breakdown at  C3 C4  F4  F7

## 2023-01-21 NOTE — Procedures (Addendum)
Patient Name: Jermaine [name]  MRN: [id number]  Epilepsy Attending: Charlsie Quest  Referring Physician/Provider: Caryl Pina, MD  Duration: 01/20/2023 1644 to 01/21/2023 6962   Patient history: 57yo M with seizure-like episodes with posturing in a patient who initially presented on 8/19 with alcohol withdrawal symptoms. EEG to evaluate for seizure   Level of alertness: comatose   AEDs during EEG study: Phenobarb, Versed   Technical aspects: This EEG study was done with scalp electrodes positioned according to the 10-20 International system of electrode placement. Electrical activity was reviewed with band pass filter of 1-70Hz , sensitivity of 7 uV/mm, display speed of 26mm/sec with a 60Hz  notched filter applied as appropriate. EEG data were recorded continuously and digitally stored.  Video monitoring was available and reviewed as appropriate.   Description: EEG showed continuous generalized 3 to 6 Hz theta-delta slowing admixed with an excessive amount of 15 to 18 Hz beta activity distributed symmetrically and diffusely. Hyperventilation and photic stimulation were not performed.       IMPRESSION: This study was suggestive of severe diffuse encephalopathy, likely related to sedation. No seizures or epileptiform discharges were seen throughout the recording.    [name] [name]

## 2023-01-21 NOTE — Progress Notes (Addendum)
Subjective: No seizures, continues to have significant agitation even after adjusting phenobarb, adding seroquel so unable to wean versed.  ROS: Unable to obtain due to poor mental status  Examination  Vital signs in last 24 hours: Temp:  [99 F (37.2 C)-100.6 F (38.1 C)] 99.9 F (37.7 C) (08/26 0900) Pulse Rate:  [51-183] 183 (08/26 0900) Resp:  [14-33] 16 (08/26 0900) BP: (103-179)/(67-162) 107/82 (08/26 0900) SpO2:  [93 %-100 %] 100 % (08/26 0900) FiO2 (%):  [40 %] 40 % (08/26 0724) Weight:  [96.8 kg] 96.8 kg (08/26 0447)  General: lying in bed, not in apparent distress Neuro: Opens eyes to repeated tactile stimulation, follows simple one-step commands like wiggling his toes, pupils appear equally round and reactive, spontaneously moving all 4 extremities in bed  Basic Metabolic Panel: Recent Labs  Lab 01/17/23 0715 01/17/23 1757 01/18/23 0300 01/18/23 1810 01/19/23 0610 01/19/23 1743 01/20/23 0630  NA 135 137 139  --  139  --  138  K 2.9* 4.5 3.6  --  4.3  --  3.6  CL 103 105 113*  --  109  --  103  CO2 23 25 21*  --  22  --  29  GLUCOSE 120* 173* 143*  --  85  --  90  BUN 15 11 10   --  15  --  17  CREATININE 1.05 0.97 0.84  --  0.95  --  0.88  CALCIUM 7.3* 7.6* 7.7*  --  7.9*  --  8.7*  MG 2.5* 2.7* 2.6* 2.5* 2.4 2.3 2.2  PHOS 2.1* 3.0 1.8* 3.0 3.1 4.3 4.1    CBC: Recent Labs  Lab 01/16/23 0257 01/17/23 0715 01/17/23 1757 01/18/23 0300 01/19/23 0610 01/20/23 0630  WBC 9.0 8.2 4.5 5.2 5.9 6.4  NEUTROABS 7.5  --   --   --   --   --   HGB 14.6 13.6 14.0 12.9* 13.0 14.3  HCT 43.2 41.1 42.3 38.4* 38.5* 42.4  MCV 94.9 95.8 98.1 95.3 97.0 96.8  PLT 88* 100* 101* 97* 101* 139*     Coagulation Studies: No results for input(s): "LABPROT", "INR" in the last 72 hours.  Imaging personally reviewed  MR Brai wo contrast: Unremarkable brain MRI. No evidence of acute abnormality.    ASSESSMENT AND PLAN:  Inpatient seizure-like episodes with posturing in a 57  year old male who initially presented on 8/19 with alcohol withdrawal symptoms.   Seizure like activity Alcohol use disorder with alcohol withdrawal - no seizures overnight  Recommendations - dc ltm eeg as no seizures for over 48 hours - first seizure like episode may have been a provoked seizure due to alcohol withdrawal vs posturing and jerking due to delirium tremens and agitation - agree with phenobabr - wean versed as tolerated - alcohol cessation counselling when extubated -- continue seizure precautions - prn iv versed for clinical seizure - discussed plan with ICU team via secure chat - neuro will sign off, please call for questions  Seizure precautions: Per [facility] statutes, patients with seizures are not allowed to drive until they have been seizure-free for six months and cleared by a physician    Use caution when using heavy equipment or power tools. Avoid working on ladders or at heights. Take showers instead of baths. Ensure the water temperature is not too high on the home water heater. Do not go swimming alone. Do not lock yourself in a room alone (i.e. bathroom). When caring for infants  or small children, sit down when holding, feeding, or changing them to minimize risk of injury to the child in the event you have a seizure. Maintain good sleep hygiene. Avoid alcohol.    If patient has another seizure, call 911 and bring them back to the ED if: A.  The seizure lasts longer than 5 minutes.      B.  The patient doesn't wake shortly after the seizure or has new problems such as difficulty seeing, speaking or moving following the seizure C.  The patient was injured during the seizure D.  The patient has a temperature over 102 F (39C) E.  The patient vomited during the seizure and now is having trouble breathing    During the Seizure   - First, ensure adequate ventilation and place patients on the floor on their left side  Loosen clothing around the neck and  ensure the airway is patent. If the patient is clenching the teeth, do not force the mouth open with any object as this can cause severe damage - Remove all items from the surrounding that can be hazardous. The patient may be oblivious to what's happening and may not even know what he or she is doing. If the patient is confused and wandering, either gently guide him/her away and block access to outside areas - Reassure the individual and be comforting - Call 911. In most cases, the seizure ends before EMS arrives. However, there are cases when seizures may last over 3 to 5 minutes. Or the individual may have developed breathing difficulties or severe injuries. If a pregnant patient or a person with diabetes develops a seizure, it is prudent to call an ambulance. - Finally, if the patient does not regain full consciousness, then call EMS. Most patients will remain confused for about 45 to 90 minutes after a seizure, so you must use judgment in calling for help.    After the Seizure (Postictal Stage)   After a seizure, most patients experience confusion, fatigue, muscle pain and/or a headache. Thus, one should permit the individual to sleep. For the next few days, reassurance is essential. Being calm and helping reorient the person is also of importance.   Most seizures are painless and end spontaneously. Seizures are not harmful to others but can lead to complications such as stress on the lungs, brain and the heart. Individuals with prior lung problems may develop labored breathing and respiratory distress.   I have spent a total of  36  minutes with the patient reviewing hospital notes,  test results, labs and examining the patient as well as establishing an assessment and plan.  > 50% of time was spent in direct patient care.     Lindie Spruce Epilepsy [facility] For questions after 5pm please refer to AMION to reach the Neurologist on call

## 2023-01-21 NOTE — Progress Notes (Signed)
Advanced ET tube 2 cm to 25 cm at the lip, replaced ET tube holder, in line suction and resecured oral bite block.  Tolerated well. Obtained sputum culture as well

## 2023-01-21 NOTE — Progress Notes (Signed)
Pharmacy Antibiotic Note  Jermaine [name] is a 57 y.o. male admitted on 01/14/2023 with alcohol withdrawal syndrome and concern for aspiration pneumonia. Patient has been on unasyn since 8/21 and now will broaden antibiotics with concern for worsening. Pharmacy has been consulted for vancomycin (goal AUC 400-550) and zosyn dosing.  Plan: Vancomycin 2000mg  x 1 followed by vancomycin 1250mg  q12h (eAUC 464, Scr 1. 02) Zosyn 3.375g q8h F/u renal function, MRSA nares result, and ability to narrow therapy  Height: 5\' 10"  (177.8 cm) Weight: 96.8 kg (213 lb 6.5 oz) IBW/kg (Calculated) : 73  Temp (24hrs), Avg:99.9 F (37.7 C), Min:99.5 F (37.5 C), Max:100.9 F (38.3 C)  Recent Labs  Lab 01/17/23 1757 01/18/23 0300 01/19/23 0610 01/20/23 0630 01/21/23 1114  WBC 4.5 5.2 5.9 6.4 9.6  CREATININE 0.97 0.84 0.95 0.88 1.02    Estimated Creatinine Clearance: 93.2 mL/min (by C-G formula based on SCr of 1.02 mg/dL).    No Known Allergies  Antimicrobials this admission: Unasyn 8/21 > 8/26 Zosyn 8/26 > Vancomycin 8/26 >  Microbiology results: 8/22 BCx: NGTD 8/26 Sputum: few GPC in pairs, rare GNR  8/26 MRSA PCR: pending  Thank you for allowing pharmacy to be a part of this patient's care.  Marja Kays 01/21/2023 7:29 PM

## 2023-01-21 NOTE — TOC CM/SW Note (Signed)
Transition of Care [facility]) - Inpatient Brief Assessment   Patient Details  Name: Jermaine [name] MRN: [id number] Date of Birth: 02-Sep-1965  Transition of Care [facility]) CM/SW Contact:    Tom-Johnson, Hershal Coria, RN Phone Number: 01/21/2023, 3:18 PM   Clinical Narrative:  Patient continues to be intubated and sedated. MRI with no acute findings. EEG negative for Seizures. Neurology signed off.   No TOC needs or recommendations noted at this time.  Patient not Medically ready for discharge.  CM will continue to follow as patient progresses with care towards discharge.        Transition of Care Asessment: Insurance and Status: Insurance coverage has been reviewed Patient has primary care physician: Yes Home environment has been reviewed: from home alone Prior level of function:: independent Prior/Current Home Services: No current home services Social Determinants of Health Reivew: SDOH reviewed no interventions necessary Readmission risk has been reviewed: Yes Transition of care needs: no transition of care needs at this time

## 2023-01-21 NOTE — Progress Notes (Signed)
LTM EEG discontinued - no skin breakdown at unhook. [facility] notified 
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
## 2023-01-22 DIAGNOSIS — R502 Drug induced fever: Secondary | ICD-10-CM

## 2023-01-22 DIAGNOSIS — F10931 Alcohol use, unspecified with withdrawal delirium: Secondary | ICD-10-CM | POA: Diagnosis not present

## 2023-01-22 DIAGNOSIS — Z9911 Dependence on respirator [ventilator] status: Secondary | ICD-10-CM | POA: Diagnosis not present

## 2023-01-22 DIAGNOSIS — J150 Pneumonia due to Klebsiella pneumoniae: Secondary | ICD-10-CM

## 2023-01-22 DIAGNOSIS — J9601 Acute respiratory failure with hypoxia: Secondary | ICD-10-CM

## 2023-01-22 LAB — CBC WITH DIFFERENTIAL/PLATELET
Abs Immature Granulocytes: 0 10*3/uL (ref 0.00–0.07)
Basophils Absolute: 0 10*3/uL (ref 0.0–0.1)
Basophils Relative: 0 %
Eosinophils Absolute: 0.1 10*3/uL (ref 0.0–0.5)
Eosinophils Relative: 1 %
HCT: 46.7 % (ref 39.0–52.0)
Hemoglobin: 15.5 g/dL (ref 13.0–17.0)
Lymphocytes Relative: 23 %
Lymphs Abs: 2.6 10*3/uL (ref 0.7–4.0)
MCH: 32.1 pg (ref 26.0–34.0)
MCHC: 33.2 g/dL (ref 30.0–36.0)
MCV: 96.7 fL (ref 80.0–100.0)
Monocytes Absolute: 1.3 10*3/uL — ABNORMAL HIGH (ref 0.1–1.0)
Monocytes Relative: 12 %
Neutro Abs: 7.1 10*3/uL (ref 1.7–7.7)
Neutrophils Relative %: 64 %
Platelets: 245 10*3/uL (ref 150–400)
RBC: 4.83 MIL/uL (ref 4.22–5.81)
RDW: 13.5 % (ref 11.5–15.5)
WBC: 11.1 10*3/uL — ABNORMAL HIGH (ref 4.0–10.5)
nRBC: 0 % (ref 0.0–0.2)
nRBC: 0 /100 WBC

## 2023-01-22 LAB — HEPATIC FUNCTION PANEL
ALT: 50 U/L — ABNORMAL HIGH (ref 0–44)
AST: 54 U/L — ABNORMAL HIGH (ref 15–41)
Albumin: 3.2 g/dL — ABNORMAL LOW (ref 3.5–5.0)
Alkaline Phosphatase: 53 U/L (ref 38–126)
Bilirubin, Direct: <0.1 mg/dL (ref 0.0–0.2)
Total Bilirubin: 0.6 mg/dL (ref 0.3–1.2)
Total Protein: 6.9 g/dL (ref 6.5–8.1)

## 2023-01-22 LAB — BASIC METABOLIC PANEL
Anion gap: 12 (ref 5–15)
BUN: 22 mg/dL — ABNORMAL HIGH (ref 6–20)
CO2: 26 mmol/L (ref 22–32)
Calcium: 9.2 mg/dL (ref 8.9–10.3)
Chloride: 98 mmol/L (ref 98–111)
Creatinine, Ser: 1.06 mg/dL (ref 0.61–1.24)
GFR, Estimated: >60 mL/min (ref >60)
Glucose, Bld: 83 mg/dL (ref 70–99)
Potassium: 3.8 mmol/L (ref 3.5–5.1)
Sodium: 136 mmol/L (ref 135–145)

## 2023-01-22 LAB — GLUCOSE, CAPILLARY
Glucose-Capillary: 85 mg/dL (ref 70–99)
Glucose-Capillary: 83 mg/dL (ref 70–99)
Glucose-Capillary: 133 mg/dL — ABNORMAL HIGH (ref 70–99)
Glucose-Capillary: 85 mg/dL (ref 70–99)
Glucose-Capillary: 96 mg/dL (ref 70–99)
Glucose-Capillary: 107 mg/dL — ABNORMAL HIGH (ref 70–99)

## 2023-01-22 MED ORDER — HYDROMORPHONE HCL-NACL 50-0.9 MG/50ML-% IV SOLN
0.5000 mg/h | INTRAVENOUS | Status: DC
  Administered 2023-01-22: 0.5 mg/h via INTRAVENOUS
  Administered 2023-01-23: 2 mg/h via INTRAVENOUS
  Administered 2023-01-24: 4 mg/h via INTRAVENOUS
  Filled 2023-01-22 (×3): qty 50

## 2023-01-22 MED ORDER — NOREPINEPHRINE 4 MG/250ML-% IV SOLN
INTRAVENOUS | Status: AC
  Administered 2023-01-22: 2 ug/min via INTRAVENOUS
  Filled 2023-01-22: qty 250

## 2023-01-22 MED ORDER — FUROSEMIDE 10 MG/ML IJ SOLN
40.0000 mg | Freq: Once | INTRAMUSCULAR | Status: AC
  Administered 2023-01-22: 40 mg via INTRAVENOUS
  Filled 2023-01-22: qty 4

## 2023-01-22 MED ORDER — BANATROL TF EN LIQD
60.0000 mL | Freq: Two times a day (BID) | ENTERAL | Status: DC
  Administered 2023-01-22 – 2023-01-27 (×10): 60 mL
  Filled 2023-01-22 (×12): qty 60

## 2023-01-22 MED ORDER — DOCUSATE SODIUM 50 MG/5ML PO LIQD: 100.0000 mg | Freq: Two times a day (BID) | ORAL | Status: DC

## 2023-01-22 MED ORDER — HYDROMORPHONE BOLUS VIA INFUSION
0.2500 mg | INTRAVENOUS | Status: DC | PRN
  Administered 2023-01-23: 2 mg via INTRAVENOUS

## 2023-01-22 MED ORDER — HYDROMORPHONE HCL 1 MG/ML IJ SOLN
1.0000 mg | Freq: Once | INTRAMUSCULAR | Status: AC
  Administered 2023-01-22: 1 mg via INTRAVENOUS
  Filled 2023-01-22: qty 1

## 2023-01-22 MED ORDER — POLYETHYLENE GLYCOL 3350 17 G PO PACK: 17.0000 g | PACK | Freq: Every day | ORAL | Status: DC

## 2023-01-22 MED ORDER — NOREPINEPHRINE 4 MG/250ML-% IV SOLN: 0.0000 ug/min | INTRAVENOUS | Status: DC

## 2023-01-22 MED ORDER — NOREPINEPHRINE 4 MG/250ML-% IV SOLN
2.0000 ug/min | INTRAVENOUS | Status: DC
  Filled 2023-01-22: qty 250

## 2023-01-22 MED ORDER — HYALURONIDASE HUMAN 150 UNIT/ML IJ SOLN
150.0000 [IU] | INTRAMUSCULAR | Status: AC
  Administered 2023-01-22: 150 [IU] via SUBCUTANEOUS
  Filled 2023-01-22: qty 1

## 2023-01-22 MED ORDER — SODIUM CHLORIDE 0.9 % IV SOLN
250.0000 mL | INTRAVENOUS | Status: DC
  Administered 2023-01-25: 250 mL via INTRAVENOUS

## 2023-01-22 MED ORDER — POTASSIUM CHLORIDE 20 MEQ PO PACK
40.0000 meq | PACK | Freq: Once | ORAL | Status: AC
  Administered 2023-01-22: 40 meq
  Filled 2023-01-22: qty 2

## 2023-01-22 NOTE — Progress Notes (Signed)
NAME:  Binnie Living, MRN:  [id number], DOB:  07/11/1965, LOS: 8 ADMISSION DATE:  01/14/2023, CONSULTATION DATE:  01/16/23 REFERRING MD:  TRH, CHIEF COMPLAINT:  ETOH withdrawal   History of Present Illness:  57 year old male with PMH of anxiety, depression, and ETOH abuse who presented 8/19 after reports of binge drinking for the last 2-3 months and decided to quit.  Last ETOH 8/19.  Admitted to [facility] in active ETOH withdrawal, ETOH 342 on admit, started on withdrawal protocol, however worsening agitation and secretions overnight 8/20, requiring 4L [place].  Was started on precedex but CIWAs remained 18-43.  Phenobarbital taper ordered by but not able to be given while NPO.  PCCM consulted for further evaluation.  Required intubation for respiratory secretions and airway protection.   Pertinent  Medical History   Past Medical History:  Diagnosis Date   Alcohol abuse    Anxiety    Class 1 obesity 09/14/2021   Depression     Significant Hospital Events: Including procedures, antibiotic start and stop dates in addition to other pertinent events   8/19 admitted ETOH withdrawal 8/21 PCCM consult, intubated, fever, abx for aspiration 8/22 mucous plugging, seizure like activity, CTH/ MRI brain, ceribell neg 8/23 seen by neurology.  All EEGs negative.  No evidence of seizure.  Felt to be primarily metabolic encephalopathy 8/24 EEGs all negative for seizure.  Weaning fentanyl to off, weaning Versed, phenobarb dose adjusted 8/25 completing versed wean (increased to more rapid wean), cont scheduled q 6 phenobarb. Added seroquel. Tolerating PSV working towards vent liberation  8/26 DC precedex. Off steroids and antibiotics. Restarting propofol, continue versed wean, continue phenobarb and seroquel. Lasix 40mg  IV. Started ketamine due to continued agitation. Became febrile in PM > abx switched from Unasyn to Vanc/Zosyn 8/27 persistently febrile, ?drug-induced. DC ketamine, started continuous hydromorphone. Lasix  40mg  IV. DC vanc due to neg MRSA.   Interim History / Subjective:   Febrile yesterday and overnight, tmax 101.7. Abx broadened yesterday to Vanc/Zosyn.   Objective   Blood pressure 103/77, pulse (!) 102, temperature (!) 100.9 F (38.3 C), resp. rate 20, height 5\' 10"  (1.778 m), weight 96.9 kg, SpO2 98%.    Vent Mode: PRVC FiO2 (%):  [40 %] 40 % Set Rate:  [20 bmp] 20 bmp Vt Set:  [580 mL] 580 mL PEEP:  [5 cmH20] 5 cmH20 Plateau Pressure:  [16 cmH20] 16 cmH20   Intake/Output Summary (Last 24 hours) at 01/22/2023 1048 Last data filed at 01/22/2023 0800 Gross per 24 hour  Intake 3316.04 ml  Output 4201 ml  Net -884.96 ml   Filed Weights   01/20/23 0500 01/21/23 0447 01/22/23 0318  Weight: 101 kg 96.8 kg 96.9 kg   Examination: Gen: NAD, laying flat in bed, intubated and sedated HEENT: NCAT, ETT in place CV: RRR no murmurs Pulm: Slightly diminished RLL, scattered rhonchi, comfortable WOB on vent Neuro: sedated, does not awaken to verbal stimuli. PERRLA Ext: no significant edema  CBC    Component Value Date/Time   WBC 11.1 (H) 01/22/2023 0458   RBC 4.83 01/22/2023 0458   HGB 15.5 01/22/2023 0458   HCT 46.7 01/22/2023 0458   PLT 245 01/22/2023 0458   MCV 96.7 01/22/2023 0458   MCH 32.1 01/22/2023 0458   MCHC 33.2 01/22/2023 0458   RDW 13.5 01/22/2023 0458   LYMPHSABS 2.6 01/22/2023 0458   MONOABS 1.3 (H) 01/22/2023 0458   EOSABS 0.1 01/22/2023 0458   BASOSABS 0.0 01/22/2023 0458  CMP     Component Value Date/Time   NA 136 01/22/2023 0458   K 3.8 01/22/2023 0458   CL 98 01/22/2023 0458   CO2 26 01/22/2023 0458   GLUCOSE 83 01/22/2023 0458   BUN 22 (H) 01/22/2023 0458   CREATININE 1.06 01/22/2023 0458   CALCIUM 9.2 01/22/2023 0458   PROT 6.9 01/22/2023 0455   ALBUMIN 3.2 (L) 01/22/2023 0455   AST 54 (H) 01/22/2023 0455   ALT 50 (H) 01/22/2023 0455   ALKPHOS 53 01/22/2023 0455   BILITOT 0.6 01/22/2023 0455   GFRNONAA >60 01/22/2023 0458     Sputum cx  - few GPC in pairs, rare GNR MRSA neg  Resolved Hospital Problem list   Shock (resolved. Mixed picture of sepsis and hypovolemia)   Assessment & Plan:   Acute metabolic encephalopathy due to alcohol withdrawal with severe delirium tremens complicated further by delirium  Elevated LFTs EEG negative for seizure, per neurology likely 2/2 severe withdrawal CT head and MRI negative LFTs mildly elevated possibly 2/2 phenobarb Plan DC ketamine due to fever, start continuous hydromorphone Appreciate neurology (signed off) Cont current phenobarb q 6 Cont versed, wean as tolerated. PRN versed for seizure Continue propofol  Off precedex Cont lexapro Cont seroquel to cover for possible delirium component  Continue thiamine and folate Avoid fever, keep euglycemic Trend LFTs - AM CMP  Acute hypoxic respiratory insufficiency  Aspiration pneumonia Mucous plugging w/ associated atelectasis  Fever and leukocytosis Febrile tmax 101.7, leukocytosis 11.1 Given lack of clinical change, suspect fever may be drug-induced due to ketamine or propofol which were started yesterday, right before he became febrile. Sputum 8/26 growing few GPC in pairs, rare GNR, MRSA neg -1.1L last 24h with Lasix yesterday, +1.1L since admit Plan DC ketamine to see if fever resolves Continue Zosyn, DC vanc (neg MRSA), s/p unasyn x5d Lasix 40mg  IV x1 today  Cont PSV as tolerated, continues to require ventilator support due to agitation and sedative medications F/u sputum cx VAP bundle PAD protocol RASS goal -1, PRN fentanyl Continue scheduled Brovana and Pulmicort As needed albuterol S/p 5 d solumedrol   Hypotension Stable, off pressors since 8/23 norepinephrine to keep MAP > 65  Intermittent fluid and electrolyte imbalance  Plan monitor closely for refeeding syndrome given hx of ETOH abuse goal K> 4, Mag > 2   Chronic thrombocytopenia - likely from ETOH abuse Resolved  At risk for malnutrition Plan TF  per RD.   Possible hx of falls - CTH/ cervical neg Plan Monitor   Best Practice (right click and "Reselect all SmartList Selections" daily)   Diet/type: tubefeeds DVT prophylaxis: LMWH GI prophylaxis: PPI Lines: N/A Foley:  Yes, and it is still needed Code Status:  full code Last date of multidisciplinary goals of care discussion [8/22]        Vonna Drafts, MD

## 2023-01-22 NOTE — Progress Notes (Deleted)
Wasted 80ml of Ketamine IV bag with Multimedia programmer in steri-cycle

## 2023-01-22 NOTE — Progress Notes (Signed)
Wasted of Ketamine into the steri-cycle with Lennox Grumbles, RN

## 2023-01-22 NOTE — Progress Notes (Signed)
Consult for vasopressor uspiv.   LLFA currently with g22 1.75 uspiv ,infusing IV drips. RFA with some swelling. Attempted LLFA medial but unsuccesful. USPIV placed succesfully on LUA cephalic with good blood return, Informed RN upper arm piv can be used for other meds and only use LLFA uspiv for vasopressor. Pt with bilateral wrist restraints and on diprivan and versed.

## 2023-01-23 DIAGNOSIS — F10930 Alcohol use, unspecified with withdrawal, uncomplicated: Secondary | ICD-10-CM | POA: Diagnosis not present

## 2023-01-23 DIAGNOSIS — J9601 Acute respiratory failure with hypoxia: Secondary | ICD-10-CM | POA: Diagnosis not present

## 2023-01-23 DIAGNOSIS — Z9911 Dependence on respirator [ventilator] status: Secondary | ICD-10-CM | POA: Diagnosis not present

## 2023-01-23 LAB — CULTURE, BLOOD (ROUTINE X 2)
Culture: NO GROWTH
Culture: NO GROWTH
Special Requests: ADEQUATE
Special Requests: ADEQUATE

## 2023-01-23 LAB — CBC WITH DIFFERENTIAL/PLATELET
Abs Immature Granulocytes: 0.71 10*3/uL — ABNORMAL HIGH (ref 0.00–0.07)
Basophils Absolute: 0 10*3/uL (ref 0.0–0.1)
Basophils Relative: 1 %
Eosinophils Absolute: 0.3 10*3/uL (ref 0.0–0.5)
Eosinophils Relative: 3 %
HCT: 40.9 % (ref 39.0–52.0)
Hemoglobin: 14.1 g/dL (ref 13.0–17.0)
Immature Granulocytes: 9 %
Lymphocytes Relative: 28 %
Lymphs Abs: 2.3 10*3/uL (ref 0.7–4.0)
MCH: 32.9 pg (ref 26.0–34.0)
MCHC: 34.5 g/dL (ref 30.0–36.0)
MCV: 95.3 fL (ref 80.0–100.0)
Monocytes Absolute: 1 10*3/uL (ref 0.1–1.0)
Monocytes Relative: 12 %
Neutro Abs: 3.8 10*3/uL (ref 1.7–7.7)
Neutrophils Relative %: 47 %
Platelets: 302 10*3/uL (ref 150–400)
RBC: 4.29 MIL/uL (ref 4.22–5.81)
RDW: 13.8 % (ref 11.5–15.5)
WBC: 8.1 10*3/uL (ref 4.0–10.5)
nRBC: 0 % (ref 0.0–0.2)

## 2023-01-23 LAB — COMPREHENSIVE METABOLIC PANEL
ALT: 31 U/L (ref 0–44)
AST: 22 U/L (ref 15–41)
Albumin: 2.8 g/dL — ABNORMAL LOW (ref 3.5–5.0)
Alkaline Phosphatase: 44 U/L (ref 38–126)
Anion gap: 9 (ref 5–15)
BUN: 26 mg/dL — ABNORMAL HIGH (ref 6–20)
CO2: 24 mmol/L (ref 22–32)
Calcium: 8.6 mg/dL — ABNORMAL LOW (ref 8.9–10.3)
Chloride: 102 mmol/L (ref 98–111)
Creatinine, Ser: 1.06 mg/dL (ref 0.61–1.24)
GFR, Estimated: >60 mL/min (ref >60)
Glucose, Bld: 105 mg/dL — ABNORMAL HIGH (ref 70–99)
Potassium: 3.7 mmol/L (ref 3.5–5.1)
Sodium: 135 mmol/L (ref 135–145)
Total Bilirubin: 0.6 mg/dL (ref 0.3–1.2)
Total Protein: 6 g/dL — ABNORMAL LOW (ref 6.5–8.1)

## 2023-01-23 LAB — TRIGLYCERIDES: Triglycerides: 126 mg/dL (ref <150)

## 2023-01-23 LAB — GLUCOSE, CAPILLARY
Glucose-Capillary: 100 mg/dL — ABNORMAL HIGH (ref 70–99)
Glucose-Capillary: 101 mg/dL — ABNORMAL HIGH (ref 70–99)
Glucose-Capillary: 123 mg/dL — ABNORMAL HIGH (ref 70–99)

## 2023-01-23 LAB — CULTURE, RESPIRATORY W GRAM STAIN

## 2023-01-23 LAB — PHOSPHORUS: Phosphorus: 2.9 mg/dL (ref 2.5–4.6)

## 2023-01-23 LAB — MAGNESIUM: Magnesium: 2.4 mg/dL (ref 1.7–2.4)

## 2023-01-23 MED ORDER — POTASSIUM CHLORIDE 20 MEQ PO PACK
40.0000 meq | PACK | Freq: Once | ORAL | Status: AC
  Administered 2023-01-23: 40 meq
  Filled 2023-01-23: qty 2

## 2023-01-23 MED ORDER — SODIUM CHLORIDE 0.9 % IV SOLN
2.0000 g | INTRAVENOUS | Status: AC
  Administered 2023-01-23 – 2023-01-27 (×5): 2 g via INTRAVENOUS
  Filled 2023-01-23 (×5): qty 20

## 2023-01-23 NOTE — Progress Notes (Signed)
Nutrition Follow-up  DOCUMENTATION CODES:   Not applicable  INTERVENTION:  Continue tube feeds via OG-tube: Vital AF 1.2 at 65 mL/hr (1560 mL per day) Regimen provides 1872 kcal, 117 gm protein, and 1265 mL free water daily. Continue Folic acid, thiamine, and Multivitamin w/ minerals daily Banatrol TF - BID  NUTRITION DIAGNOSIS:   Inadequate oral intake related to inability to eat as evidenced by NPO status. - Ongoing, being addressed via TF  GOAL:   Patient will meet greater than or equal to 90% of their needs - Met via TF  MONITOR:   Vent status, Labs, Weight trends, TF tolerance, I & O's  REASON FOR ASSESSMENT:   Consult Enteral/tube feeding initiation and management  ASSESSMENT:   57 y.o. male with PMHx including anxiety, depression, EtOH abuse who was admitted for alcohol withdrawal  8/21 - tube feeds started 8/22 - tube feeds kept at trickle rate 8/23 - transfer to [facility] for LTM, begin advancing TF to goal 8/26 - FMS placed  Patient remains intubated on ventilator support. No family at bedside. TF running at goal at time of RD visit. Discussed with RN, pt tolerating feeds.   MV: 11.4 L/min MAP (cuff): 82 Temp (24hrs), Avg:98.6 F (37 C), Min:97.7 F (36.5 C), Max:100.2 F (37.9 C)  Drips  Dilaudid Versed Levophed Propofol: 17.4 ml/hr (459 kcal/day)  Admission Weight: 92.1 kg Current Weight: 95.3 kg   Medications reviewed and include: Folic acid, MVI, Protonix, Thiamine, IV antibiotics  Labs reviewed: Sodium 135, Potassium 3.7, BUN 26, Creatinine 1.06, Phosphorus 2.9, Magnesium 2.4 CBG: 85-123 x 24 hrs   UOP: 1770 mL x 24 hrs OG output: 425 mL x 24 hrs    Diet Order:   Diet Order     None       EDUCATION NEEDS:   Not appropriate for education at this time  Skin:  Skin Assessment: Reviewed RN Assessment  Last BM:  8/27 via FMS - 50 mL  Height:  Ht Readings from Last 1 Encounters:  01/15/23 5\' 10"  (1.778 m)   Weight:  Wt Readings  from Last 1 Encounters:  01/23/23 95.3 kg    Ideal Body Weight:  75.5 kg  BMI:  Body mass index is 30.15 kg/m.  Estimated Nutritional Needs:  Kcal:  1900-2100 Protein:  110-130 grams Fluid:  >/= 2L   Kirby Crigler RD, LDN Clinical Dietitian See [facility] for contact information.

## 2023-01-23 NOTE — Progress Notes (Signed)
NAME:  Jermaine [name], MRN:  [id number], DOB:  [birth year], LOS: 9 ADMISSION DATE:  01/14/2023, CONSULTATION DATE:  01/16/23 REFERRING MD:  TRH, CHIEF COMPLAINT:  ETOH withdrawal   History of Present Illness:  57 year old male with PMH of anxiety, depression, and ETOH abuse who presented 8/19 after reports of binge drinking for the last 2-3 months and decided to quit.  Last ETOH 8/19.  Admitted to [facility] in active ETOH withdrawal, ETOH 342 on admit, started on withdrawal protocol, however worsening agitation and secretions overnight 8/20, requiring 4L [place].  Was started on precedex but CIWAs remained 18-43.  Phenobarbital taper ordered by but not able to be given while NPO.  PCCM consulted for further evaluation.  Required intubation for respiratory secretions and airway protection.   Pertinent  Medical History   Past Medical History:  Diagnosis Date   Alcohol abuse    Anxiety    Class 1 obesity 09/14/2021   Depression     Significant Hospital Events: Including procedures, antibiotic start and stop dates in addition to other pertinent events   8/19 admitted ETOH withdrawal 8/21 PCCM consult, intubated, fever, abx for aspiration 8/22 mucous plugging, seizure like activity, CTH/ MRI brain, ceribell neg 8/23 seen by neurology.  All EEGs negative.  No evidence of seizure.  Felt to be primarily metabolic encephalopathy 8/24 EEGs all negative for seizure.  Weaning fentanyl to off, weaning Versed, phenobarb dose adjusted 8/25 completing versed wean (increased to more rapid wean), cont scheduled q 6 phenobarb. Added seroquel. Tolerating PSV working towards vent liberation  8/26 DC precedex. Off steroids and antibiotics. Restarting propofol, continue versed wean, continue phenobarb and seroquel. Lasix 40mg  IV. Started ketamine due to continued agitation. Became febrile in PM > abx switched from Unasyn to Vanc/Zosyn 8/27 persistently febrile, ?drug-induced. DC ketamine, started continuous hydromorphone. Lasix  40mg  IV. DC vanc due to neg MRSA. Sputum cx grew K pneumoniae 8/28 afebrile off ketamine. Weaning versed. Switched to ceftriaxone.   Interim History / Subjective:   Afebrile, NAEON. Off ketamine. Requiring some levo to keep MAP >65. Remained sedated through the night.  Objective   Blood pressure 91/64, pulse 61, temperature 98.2 F (36.8 C), resp. rate 20, height 5\' 10"  (1.778 m), weight 95.3 kg, SpO2 99%.    Vent Mode: PRVC FiO2 (%):  [40 %] 40 % Set Rate:  [20 bmp] 20 bmp Vt Set:  [580 mL] 580 mL PEEP:  [5 cmH20] 5 cmH20 Plateau Pressure:  [16 cmH20-17 cmH20] 17 cmH20   Intake/Output Summary (Last 24 hours) at 01/23/2023 1107 Last data filed at 01/23/2023 0900 Gross per 24 hour  Intake 1575.19 ml  Output 1915 ml  Net -339.81 ml   Filed Weights   01/21/23 0447 01/22/23 0318 01/23/23 0500  Weight: 96.8 kg 96.9 kg 95.3 kg   Examination: Gen: NAD, laying flat in bed, intubated and sedated HEENT: NCAT, ETT in place CV: RRR no murmurs Pulm: CTAB, comfortable WOB on vent Neuro: sedated, does not awaken to verbal stimuli. PERRLA Ext: no significant edema     Latest Ref Rng & Units 01/23/2023    9:16 AM 01/22/2023    4:58 AM 01/21/2023   11:14 AM  CBC  WBC 4.0 - 10.5 K/uL 8.1  11.1  9.6   Hemoglobin 13.0 - 17.0 g/dL 40.9  81.1  91.4   Hematocrit 39.0 - 52.0 % 40.9  46.7  42.8   Platelets 150 - 400 K/uL 302  245  182  Latest Ref Rng & Units 01/23/2023    9:16 AM 01/22/2023    4:58 AM 01/22/2023    4:55 AM  CMP  Glucose 70 - 99 mg/dL 161  83    BUN 6 - 20 mg/dL 26  22    Creatinine 0.96 - 1.24 mg/dL 0.45  4.09    Sodium 811 - 145 mmol/L 135  136    Potassium 3.5 - 5.1 mmol/L 3.7  3.8    Chloride 98 - 111 mmol/L 102  98    CO2 22 - 32 mmol/L 24  26    Calcium 8.9 - 10.3 mg/dL 8.6  9.2    Total Protein 6.5 - 8.1 g/dL 6.0   6.9   Total Bilirubin 0.3 - 1.2 mg/dL 0.6   0.6   Alkaline Phos 38 - 126 U/L 44   53   AST 15 - 41 U/L 22   54   ALT 0 - 44 U/L 31   50      Sputum cx - klebsiella pneumoniae MRSA neg  Resolved Hospital Problem list   Shock (resolved. Mixed picture of sepsis and hypovolemia)   Assessment & Plan:   Acute metabolic encephalopathy due to alcohol withdrawal with severe delirium tremens complicated further by delirium  EEG negative for seizure, per neurology likely 2/2 severe withdrawal CT head and MRI negative LFTs normalized Plan Dc'd ketamine due to fever Appreciate neurology (signed off) Continue continuous hydromorphone Cont current phenobarb q 6 Wean versed today. PRN versed for seizure Continue propofol  Off precedex Cont lexapro Cont seroquel to cover for possible delirium component  Continue thiamine and folate Avoid fever, keep euglycemic  Acute hypoxic respiratory insufficiency  Klebsiella pneumonia Mucous plugging w/ associated atelectasis  Fever Afebrile overnight after stopping ketamine Sputum 8/26 growing K pneumoniae MRSA neg No leukocytosis - last 24h with Lasix yesterday, +875mL since admit Plan Off ketamine Switch to Rocephin for total course of 7d s/p unasyn and zosyn Cont PSV as tolerated, continues to require ventilator support due to agitation and sedative medications VAP bundle PAD protocol RASS goal -1, PRN fentanyl Continue scheduled Brovana and Pulmicort As needed albuterol S/p 5 d solumedrol  Daily SAT/SBT as able  Hypotension Stable norepinephrine to keep MAP > 65  Intermittent fluid and electrolyte imbalance  Plan monitor closely for refeeding syndrome given hx of ETOH abuse goal K> 4, Mag > 2   Chronic thrombocytopenia - likely from ETOH abuse Resolved  At risk for malnutrition Plan TF per RD.   Possible hx of falls - CTH/ cervical neg Plan Monitor   Best Practice (right click and "Reselect all SmartList Selections" daily)   Diet/type: tubefeeds DVT prophylaxis: LMWH GI prophylaxis: PPI Lines: N/A Foley:  Yes, and it is still needed Code Status:   full code Last date of multidisciplinary goals of care discussion 8/28 updated family via phone        Vonna Drafts, MD

## 2023-01-24 DIAGNOSIS — R410 Disorientation, unspecified: Secondary | ICD-10-CM

## 2023-01-24 LAB — BASIC METABOLIC PANEL
Anion gap: 13 (ref 5–15)
BUN: 27 mg/dL — ABNORMAL HIGH (ref 6–20)
CO2: 21 mmol/L — ABNORMAL LOW (ref 22–32)
Calcium: 8.8 mg/dL — ABNORMAL LOW (ref 8.9–10.3)
Chloride: 103 mmol/L (ref 98–111)
Creatinine, Ser: 1.11 mg/dL (ref 0.61–1.24)
GFR, Estimated: >60 mL/min (ref >60)
Glucose, Bld: 92 mg/dL (ref 70–99)
Potassium: 4.8 mmol/L (ref 3.5–5.1)
Sodium: 137 mmol/L (ref 135–145)

## 2023-01-24 LAB — CBC
HCT: 47.9 % (ref 39.0–52.0)
Hemoglobin: 15.7 g/dL (ref 13.0–17.0)
MCH: 32.3 pg (ref 26.0–34.0)
MCHC: 32.8 g/dL (ref 30.0–36.0)
MCV: 98.6 fL (ref 80.0–100.0)
Platelets: 294 10*3/uL (ref 150–400)
RBC: 4.86 MIL/uL (ref 4.22–5.81)
RDW: 14 % (ref 11.5–15.5)
WBC: 15.4 10*3/uL — ABNORMAL HIGH (ref 4.0–10.5)
nRBC: 0 % (ref 0.0–0.2)

## 2023-01-24 LAB — MAGNESIUM: Magnesium: 2.4 mg/dL (ref 1.7–2.4)

## 2023-01-24 LAB — PHOSPHORUS: Phosphorus: 4.6 mg/dL (ref 2.5–4.6)

## 2023-01-24 MED ORDER — METOPROLOL TARTRATE 5 MG/5ML IV SOLN: 5.0000 mg | Freq: Four times a day (QID) | INTRAVENOUS | Status: DC

## 2023-01-24 MED ORDER — OLANZAPINE 10 MG IM SOLR
5.0000 mg | Freq: Four times a day (QID) | INTRAMUSCULAR | Status: DC | PRN
  Administered 2023-01-24 – 2023-01-27 (×5): 5 mg via INTRAMUSCULAR
  Filled 2023-01-24 (×8): qty 10

## 2023-01-24 MED ORDER — STERILE WATER FOR INJECTION IJ SOLN
INTRAMUSCULAR | Status: AC
  Administered 2023-01-24: 10 mL
  Filled 2023-01-24: qty 10

## 2023-01-24 MED ORDER — DEXMEDETOMIDINE HCL IN NACL 400 MCG/100ML IV SOLN
0.0000 ug/kg/h | INTRAVENOUS | Status: DC
  Administered 2023-01-24 (×2): 1.2 ug/kg/h via INTRAVENOUS
  Administered 2023-01-24: 0.9 ug/kg/h via INTRAVENOUS
  Administered 2023-01-24: 0.4 ug/kg/h via INTRAVENOUS
  Administered 2023-01-25 (×2): 1.2 ug/kg/h via INTRAVENOUS
  Administered 2023-01-25: 1 ug/kg/h via INTRAVENOUS
  Administered 2023-01-25: 1.2 ug/kg/h via INTRAVENOUS
  Administered 2023-01-25: 1.1 ug/kg/h via INTRAVENOUS
  Administered 2023-01-25 – 2023-01-26 (×3): 1.2 ug/kg/h via INTRAVENOUS
  Administered 2023-01-26: 1 ug/kg/h via INTRAVENOUS
  Administered 2023-01-26: 1.2 ug/kg/h via INTRAVENOUS
  Filled 2023-01-24 (×5): qty 100
  Filled 2023-01-24: qty 200
  Filled 2023-01-24 (×6): qty 100

## 2023-01-24 MED ORDER — POTASSIUM CHLORIDE 20 MEQ PO PACK: 40.0000 meq | PACK | Freq: Once | ORAL | Status: DC

## 2023-01-24 MED ORDER — METOPROLOL TARTRATE 5 MG/5ML IV SOLN
INTRAVENOUS | Status: AC
  Administered 2023-01-24: 5 mg
  Filled 2023-01-24: qty 5

## 2023-01-24 NOTE — Progress Notes (Signed)
PT Cancellation Note  Patient Details Name: Jermaine [name] MRN: [id number] DOB: March 23, 1966   Cancelled Treatment:    Reason Eval/Treat Not Completed: Patient not medically ready (chart reviewed and discussed with RN post extubation with RN stating pt still agitated and not yet appropriate)   Yarlin Breisch B Chantalle Defilippo 01/24/2023, 12:31 PM Merryl Hacker, PT Acute Rehabilitation Services Office: [phone]

## 2023-01-24 NOTE — Progress Notes (Signed)
Nutrition Follow-up  DOCUMENTATION CODES:   Not applicable  INTERVENTION:  Consider placing Cortrak and restarting tube feeds if unable to advance diet; Vital AF 1.2 at 65 mL/hr (1560 mL per day) Regimen provides 1872 kcal, 117 gm protein, and 1265 mL free water daily. Continue thiamine and Multivitamin w/ minerals daily Banatrol TF - BID  NUTRITION DIAGNOSIS:   Inadequate oral intake related to inability to eat as evidenced by NPO status. - Ongoing  GOAL:   Patient will meet greater than or equal to 90% of their needs - Not met  MONITOR:   Vent status, Labs, Weight trends, TF tolerance, I & O's  REASON FOR ASSESSMENT:   Consult Enteral/tube feeding initiation and management  ASSESSMENT:   57 y.o. male with PMHx including anxiety, depression, EtOH abuse who was admitted for alcohol withdrawal  8/21 - tube feeds started 8/22 - tube feeds kept at trickle rate 8/23 - transfer to [facility] for LTM, begin advancing TF to goal 8/26 - FMS placed 8/29 - Extubated; failed bedside swallow  Pt laying in bed, mentation still poor. Spoke with RN, shares that pt has a good cough, needs SLP eval. Asked resident to order SLP eval; pt failed bedside swallow with SLP. Remains on Precedex.   Admission Weight: 92.1 kg Current Weight: 998.6 kg   Medications reviewed and include: MVI, Thiamine, IV antibiotics, Precedex Labs reviewed: Sodium 137, Potassium 4.8, BUN 27, Creatinine 1.11, Phosphorus 4.6, Magnesium 2.4  UOP: 1725 mL x 24 hrs  Diet Order:   Diet Order             Diet NPO time specified Except for: Ice Chips  Diet effective now                  EDUCATION NEEDS:   Not appropriate for education at this time  Skin:  Skin Assessment: Reviewed RN Assessment  Last BM:  8/29  via FMS - 100 mL  Height:  Ht Readings from Last 1 Encounters:  01/15/23 5\' 10"  (1.778 m)   Weight:  Wt Readings from Last 1 Encounters:  01/24/23 98.6 kg   Ideal Body Weight:  75.5  kg  BMI:  Body mass index is 31.19 kg/m.  Estimated Nutritional Needs:  Kcal:  1900-2100 Protein:  110-130 grams Fluid:  >/= 2L   Kirby Crigler RD, LDN Clinical Dietitian See [facility] for contact information.

## 2023-01-24 NOTE — Evaluation (Signed)
Clinical/Bedside Swallow Evaluation Patient Details  Name: Jermaine [name] MRN: [id number] Date of Birth: [birth year]  Today's Date: 01/24/2023 Time: SLP Start Time (ACUTE ONLY): 1340 SLP Stop Time (ACUTE ONLY): 1350 SLP Time Calculation (min) (ACUTE ONLY): 10 min  Past Medical History:  Past Medical History:  Diagnosis Date   Alcohol abuse    Anxiety    Class 1 obesity 09/14/2021   Depression    Past Surgical History:  Past Surgical History:  Procedure Laterality Date   HERNIA REPAIR     HPI:  57 year old male with PMH of anxiety, depression, and ETOH abuse who presented 8/19 after reports of binge drinking for the last 2-3 months and decided to quit.  Last ETOH 8/19.  Admitted to [facility] in active ETOH withdrawal, ETOH 342 on admit, started on withdrawal protocol, however worsening agitation and secretions overnight 8/20, requiring 4L [place].  Was started on precedex but CIWAs remained 18-43.  Phenobarbital taper ordered by but not able to be given while NPO.   Required intubation for respiratory secretions and airway protection. 8/21-8/29    Assessment / Plan / Recommendation  Clinical Impression  Pt presents as very confused, deconditioned and dysphonic. He was able to follow some simple commands during assessment, but also had episodes of no response and staring without focus. Pt accepted bites of ice and masticated and swallowed. However, when given a sip of water pt seemed to have a very late swallow, several swallows and then wet respiration with no immediate cough. SLP cued a cough which sounded productive. Pt needs more time post extubation for mentation and airway to imrprove. Recommend NPO except for ice chips for now. Will f/u for tolerance. SLP Visit Diagnosis: Dysphagia, oropharyngeal phase (R13.12)    Aspiration Risk  Moderate aspiration risk    Diet Recommendation      Liquid Administration via: Cup;Straw Supervision: Patient able to self feed    Other  Recommendations       Recommendations for follow up therapy are one component of a multi-disciplinary discharge planning process, led by the attending physician.  Recommendations may be updated based on patient status, additional functional criteria and insurance authorization.  Follow up Recommendations        Assistance Recommended at Discharge    Functional Status Assessment    Frequency and Duration min 2x/week  2 weeks       Prognosis        Swallow Study   General HPI: 57 year old male with PMH of anxiety, depression, and ETOH abuse who presented 8/19 after reports of binge drinking for the last 2-3 months and decided to quit.  Last ETOH 8/19.  [facility], ETOH 342 on admit, started on withdrawal protocol, however worsening agitation and secretions overnight 8/20, requiring 4L [place].  Was started on precedex but CIWAs remained 18-43.  Phenobarbital taper ordered by but not able to be given while NPO.   Required intubation for respiratory secretions and airway protection. 8/21-8/29 Type of Study: Bedside Swallow Evaluation Previous Swallow Assessment: none Diet Prior to this Study: NPO Temperature Spikes Noted: No Respiratory Status: Room air History of Recent Intubation: No Behavior/Cognition: Alert;Requires cueing;Confused Oral Cavity Assessment: Dry Oral Care Completed by SLP: No Oral Cavity - Dentition: Adequate natural dentition Vision: Impaired for self-feeding Self-Feeding Abilities: Total assist Patient Positioning: Partially reclined Baseline Vocal Quality: Hoarse Volitional Cough: Congested Volitional Swallow: Unable to elicit    Oral/Motor/Sensory Function Overall Oral Motor/Sensory Function: Within  functional limits   Ice Chips Ice chips: Within functional limits Presentation: Spoon   Thin Liquid Thin Liquid: Impaired Presentation: Straw Pharyngeal  Phase Impairments: Multiple swallows;Wet Vocal Quality;Suspected delayed Swallow    Nectar Thick  Nectar Thick Liquid: Not tested   Honey Thick Honey Thick Liquid: Not tested   Puree Puree: Not tested   Solid     Solid: Not tested      Davon Abdelaziz, Riley Nearing 01/24/2023,2:28 PM

## 2023-01-24 NOTE — Procedures (Addendum)
Extubation Procedure Note  Patient Details:   Name: Jermaine [name] DOB: [birth year] MRN: [id number]   Airway Documentation:  Airway 8 mm (Active)  Secured at (cm) 25 cm 01/24/23 0758  Measured From Lips 01/24/23 0758  Secured Location Center 01/24/23 0758  Secured By [employer] 01/24/23 0758  Tube Holder Repositioned Yes 01/24/23 0758  Prone position No 01/24/23 0758  Head position Left 01/18/23 0755  Cuff Pressure (cm H2O) Clear OR 27-39 CmH2O 01/24/23 0758  Site Condition Dry 01/24/23 0758   Vent end date: 01/24/2023  Vent end time: 10:10 AM  Evaluation  O2 sats: stable throughout Complications: No apparent complications Patient did tolerate procedure well. Bilateral Breath Sounds: Diminished, Clear   Yes Positive cuff leak noted PT extubated to 4L [place] w/o complications  Caelan Atchley V 01/24/2023, 10:10 AM

## 2023-01-24 NOTE — Progress Notes (Addendum)
NAME:  Jermaine [name], MRN:  [id number], DOB:  Apr 18, 1966, LOS: 10 ADMISSION DATE:  01/14/2023, CONSULTATION DATE:  01/16/23 REFERRING MD:  TRH, CHIEF COMPLAINT:  ETOH withdrawal   History of Present Illness:  57 year old male with PMH of anxiety, depression, and ETOH abuse who presented 8/19 after reports of binge drinking for the last 2-3 months and decided to quit.  Last ETOH 8/19.  Admitted to [facility] in active ETOH withdrawal, ETOH 342 on admit, started on withdrawal protocol, however worsening agitation and secretions overnight 8/20, requiring 4L [place].  Was started on precedex but CIWAs remained 18-43.  Phenobarbital taper ordered by but not able to be given while NPO.  PCCM consulted for further evaluation.  Required intubation for respiratory secretions and airway protection.   Pertinent  Medical History   Past Medical History:  Diagnosis Date   Alcohol abuse    Anxiety    Class 1 obesity 09/14/2021   Depression     Significant Hospital Events: Including procedures, antibiotic start and stop dates in addition to other pertinent events   8/19 admitted ETOH withdrawal 8/21 PCCM consult, intubated, fever, abx for aspiration 8/22 mucous plugging, seizure like activity, CTH/ MRI brain, ceribell neg 8/23 seen by neurology.  All EEGs negative.  No evidence of seizure.  Felt to be primarily metabolic encephalopathy 8/24 EEGs all negative for seizure.  Weaning fentanyl to off, weaning Versed, phenobarb dose adjusted 8/25 completing versed wean (increased to more rapid wean), cont scheduled q 6 phenobarb. Added seroquel. Tolerating PSV working towards vent liberation  8/26 DC precedex. Off steroids and antibiotics. Restarting propofol, continue versed wean, continue phenobarb and seroquel. Lasix 40mg  IV. Started ketamine due to continued agitation. Became febrile in PM > abx switched from Unasyn to Vanc/Zosyn 8/27 persistently febrile, ?drug-induced. DC ketamine, started continuous hydromorphone.  Lasix 40mg  IV. DC vanc due to neg MRSA. Sputum cx grew K pneumoniae 8/28 afebrile off ketamine. Weaning versed. Switched to ceftriaxone. 8/29 off sedation, attempting extubation. Restarted precedex   Interim History / Subjective:   Intermittently febrile 100.4 overnight. Received prn versed overnight but off continuous versed.  Objective   Blood pressure (!) 152/95, pulse (!) 121, temperature (!) 102 F (38.9 C), resp. rate 16, height 5\' 10"  (1.778 m), weight 98.6 kg, SpO2 93%.    Vent Mode: PSV;CPAP FiO2 (%):  [30 %-40 %] 30 % Set Rate:  [20 bmp] 20 bmp Vt Set:  [580 mL] 580 mL PEEP:  [5 cmH20] 5 cmH20 Pressure Support:  [8 cmH20] 8 cmH20 Plateau Pressure:  [16 cmH20-19 cmH20] 17 cmH20   Intake/Output Summary (Last 24 hours) at 01/24/2023 1013 Last data filed at 01/24/2023 4166 Gross per 24 hour  Intake 2147.92 ml  Output 1985 ml  Net 162.92 ml   Filed Weights   01/22/23 0318 01/23/23 0500 01/24/23 0500  Weight: 96.9 kg 95.3 kg 98.6 kg   Examination: Gen: NAD, laying flat in bed, intubated  HEENT: NCAT, ETT in place CV: RRR no murmurs Pulm: CTAB, comfortable WOB on vent Neuro: sedated but opens eyes and moves all four extremities spontaneously. Not following commands Ext: no significant edema. 4 point restraints     Latest Ref Rng & Units 01/24/2023    8:45 AM 01/23/2023    9:16 AM 01/22/2023    4:58 AM  CBC  WBC 4.0 - 10.5 K/uL 15.4  8.1  11.1   Hemoglobin 13.0 - 17.0 g/dL 06.3  01.6  01.0   Hematocrit 39.0 -  52.0 % 47.9  40.9  46.7   Platelets 150 - 400 K/uL 294  302  245       Latest Ref Rng & Units 01/24/2023    8:45 AM 01/23/2023    9:16 AM 01/22/2023    4:58 AM  CMP  Glucose 70 - 99 mg/dL 92  696  83   BUN 6 - 20 mg/dL 27  26  22    Creatinine 0.61 - 1.24 mg/dL 2.95  2.84  1.32   Sodium 135 - 145 mmol/L 137  135  136   Potassium 3.5 - 5.1 mmol/L 4.8  3.7  3.8   Chloride 98 - 111 mmol/L 103  102  98   CO2 22 - 32 mmol/L 21  24  26    Calcium 8.9 - 10.3  mg/dL 8.8  8.6  9.2   Total Protein 6.5 - 8.1 g/dL  6.0    Total Bilirubin 0.3 - 1.2 mg/dL  0.6    Alkaline Phos 38 - 126 U/L  44    AST 15 - 41 U/L  22    ALT 0 - 44 U/L  31      Sputum cx - klebsiella pneumoniae MRSA neg  Resolved Hospital Problem list   Shock (resolved. Mixed picture of sepsis and hypovolemia)   Assessment & Plan:   Acute metabolic encephalopathy due to alcohol withdrawal with severe delirium tremens complicated further by delirium  EEG negative for seizure, per neurology likely 2/2 severe withdrawal CT head and MRI negative LFTs normalized Plan Appreciate neurology (signed off) Restart precedex Cont current phenobarb q 6, likely start taper tomorrow PRN olanzapine for agitation Off dilaudid, propofol, versed, ketamine Cont lexapro Cont seroquel to cover for possible delirium component  Continue thiamine and folate Avoid fever, keep euglycemic  Acute hypoxic respiratory insufficiency  Klebsiella pneumonia Mucous plugging w/ associated atelectasis  Fever Tachycardia Febrile to 100.4 this morning, leukocytosis 15.4 tachycardic to 140s with breathing trial Sputum 8/26 growing K pneumoniae MRSA neg +171mL last 24h, +1L since admit Plan Start IV lopressor 5mg  q6h to control HR Attempt extubation today Off ketamine, propofol, versed, dilaudid Continue Rocephin for total course of 7d s/p unasyn and zosyn Continue scheduled Brovana and Pulmicort As needed albuterol S/p 5 d solumedrol  PRN tylenol  Hypotension Stable, now off sedation norepinephrine to keep MAP > 65  Intermittent fluid and electrolyte imbalance  Plan monitor closely for refeeding syndrome given hx of ETOH abuse goal K> 4, Mag > 2   Chronic thrombocytopenia - likely from ETOH abuse Resolved  At risk for malnutrition Plan TF per RD.   Possible hx of falls - CTH/ cervical neg Plan Monitor   Best Practice (right click and "Reselect all SmartList Selections" daily)    Diet/type: NPO off tube feeds DVT prophylaxis: LMWH GI prophylaxis: N/A Lines: N/A Foley:  Yes, and it is still needed Code Status:  full code Last date of multidisciplinary goals of care discussion 8/28 updated family via phone        Vonna Drafts, MD

## 2023-01-25 ENCOUNTER — Inpatient Hospital Stay (HOSPITAL_COMMUNITY): Payer: BLUE CROSS/BLUE SHIELD

## 2023-01-25 LAB — CBC
HCT: 40 % (ref 39.0–52.0)
Hemoglobin: 13.1 g/dL (ref 13.0–17.0)
MCH: 31.3 pg (ref 26.0–34.0)
MCHC: 32.8 g/dL (ref 30.0–36.0)
MCV: 95.7 fL (ref 80.0–100.0)
Platelets: 351 10*3/uL (ref 150–400)
RBC: 4.18 MIL/uL — ABNORMAL LOW (ref 4.22–5.81)
RDW: 13.1 % (ref 11.5–15.5)
WBC: 9.1 10*3/uL (ref 4.0–10.5)
nRBC: 0 % (ref 0.0–0.2)

## 2023-01-25 LAB — BASIC METABOLIC PANEL
Anion gap: 10 (ref 5–15)
BUN: 22 mg/dL — ABNORMAL HIGH (ref 6–20)
CO2: 24 mmol/L (ref 22–32)
Calcium: 8.8 mg/dL — ABNORMAL LOW (ref 8.9–10.3)
Chloride: 102 mmol/L (ref 98–111)
Creatinine, Ser: 0.94 mg/dL (ref 0.61–1.24)
GFR, Estimated: >60 mL/min (ref >60)
Glucose, Bld: 89 mg/dL (ref 70–99)
Potassium: 4 mmol/L (ref 3.5–5.1)
Sodium: 136 mmol/L (ref 135–145)

## 2023-01-25 MED ORDER — MAGIC MOUTHWASH
10.0000 mL | Freq: Three times a day (TID) | ORAL | Status: DC | PRN
  Administered 2023-01-25: 10 mL via ORAL
  Filled 2023-01-25 (×2): qty 10

## 2023-01-25 MED ORDER — STERILE WATER FOR INJECTION IJ SOLN
INTRAMUSCULAR | Status: AC
  Administered 2023-01-25: 5 mL
  Filled 2023-01-25: qty 10

## 2023-01-25 MED ORDER — STERILE WATER FOR INJECTION IJ SOLN
INTRAMUSCULAR | Status: AC
  Filled 2023-01-25: qty 10

## 2023-01-25 MED ORDER — HALOPERIDOL LACTATE 5 MG/ML IJ SOLN
INTRAMUSCULAR | Status: AC
  Filled 2023-01-25: qty 1

## 2023-01-25 MED ORDER — VITAL AF 1.2 CAL PO LIQD
1000.0000 mL | ORAL | Status: DC
  Administered 2023-01-25: 1000 mL
  Filled 2023-01-25 (×4): qty 1000

## 2023-01-25 MED ORDER — MIDAZOLAM HCL 2 MG/2ML IJ SOLN
INTRAMUSCULAR | Status: AC
  Filled 2023-01-25: qty 4

## 2023-01-25 MED ORDER — HALOPERIDOL LACTATE 5 MG/ML IJ SOLN
5.0000 mg | Freq: Once | INTRAMUSCULAR | Status: AC
  Administered 2023-01-25: 5 mg via INTRAVENOUS

## 2023-01-25 MED ORDER — PHENOBARBITAL SODIUM 65 MG/ML IJ SOLN
65.0000 mg | Freq: Four times a day (QID) | INTRAMUSCULAR | Status: DC
  Administered 2023-01-25 – 2023-01-26 (×5): 65 mg via INTRAVENOUS
  Filled 2023-01-25 (×5): qty 1

## 2023-01-25 MED ORDER — OXYCODONE HCL 5 MG PO TABS
5.0000 mg | ORAL_TABLET | Freq: Four times a day (QID) | ORAL | Status: DC | PRN
  Administered 2023-01-25 – 2023-01-26 (×3): 5 mg via ORAL
  Filled 2023-01-25 (×3): qty 1

## 2023-01-25 NOTE — Plan of Care (Signed)
  Problem: Clinical Measurements: Goal: Ability to maintain clinical measurements within normal limits will improve Outcome: Progressing   Problem: Clinical Measurements: Goal: Will remain free from infection Outcome: Progressing   Problem: Clinical Measurements: Goal: Diagnostic test results will improve Outcome: Progressing   Problem: Clinical Measurements: Goal: Cardiovascular complication will be avoided Outcome: Progressing   Problem: Activity: Goal: Risk for activity intolerance will decrease Outcome: Progressing

## 2023-01-25 NOTE — Procedures (Signed)
Cortrak  Person Inserting Tube:  Pitts, Heather C, RD Tube Type:  Cortrak - 43 inches Tube Size:  10 Tube Location:  Left nare Secured by: Bridle Technique Used to Measure Tube Placement:  Marking at nare/corner of mouth Cortrak Secured At:  68 cm   Cortrak Tube Team Note:  Consult received to place a Cortrak feeding tube.   X-ray is required, abdominal x-ray has been ordered by the Cortrak team. Please confirm tube placement before using the Cortrak tube.   If the tube becomes dislodged please keep the tube and contact the Cortrak team at [email or web address] for replacement.  If after hours and replacement cannot be delayed, place a NG tube and confirm placement with an abdominal x-ray.    Heather P., RD, LDN, CNSC See AMiON for contact information    

## 2023-01-25 NOTE — Progress Notes (Signed)
NAME:  Jermaine [name], MRN:  [id number], DOB:  1965-07-09, LOS: 11 ADMISSION DATE:  01/14/2023, CONSULTATION DATE:  01/16/23 REFERRING MD:  TRH, CHIEF COMPLAINT:  ETOH withdrawal   History of Present Illness:  57 year old male with PMH of anxiety, depression, and ETOH abuse who presented 8/19 after reports of binge drinking for the last 2-3 months and decided to quit.  Last ETOH 8/19.  Admitted to [facility] in active ETOH withdrawal, ETOH 342 on admit, started on withdrawal protocol, however worsening agitation and secretions overnight 8/20, requiring 4L [place].  Was started on precedex but CIWAs remained 18-43.  Phenobarbital taper ordered by but not able to be given while NPO.  PCCM consulted for further evaluation.  Required intubation for respiratory secretions and airway protection.   Pertinent  Medical History   Past Medical History:  Diagnosis Date   Alcohol abuse    Anxiety    Class 1 obesity 09/14/2021   Depression     Significant Hospital Events: Including procedures, antibiotic start and stop dates in addition to other pertinent events   8/19 admitted ETOH withdrawal 8/21 PCCM consult, intubated, fever, abx for aspiration 8/22 mucous plugging, seizure like activity, CTH/ MRI brain, ceribell neg 8/23 seen by neurology.  All EEGs negative.  No evidence of seizure.  Felt to be primarily metabolic encephalopathy 8/24 EEGs all negative for seizure.  Weaning fentanyl to off, weaning Versed, phenobarb dose adjusted 8/25 completing versed wean (increased to more rapid wean), cont scheduled q 6 phenobarb. Added seroquel. Tolerating PSV working towards vent liberation  8/26 DC precedex. Off steroids and antibiotics. Restarting propofol, continue versed wean, continue phenobarb and seroquel. Lasix 40mg  IV. Started ketamine due to continued agitation. Became febrile in PM > abx switched from Unasyn to Vanc/Zosyn 8/27 persistently febrile, ?drug-induced. DC ketamine, started continuous hydromorphone.  Lasix 40mg  IV. DC vanc due to neg MRSA. Sputum cx grew K pneumoniae 8/28 afebrile off ketamine. Weaning versed. Switched to ceftriaxone. 8/29 off sedation. Extubated. Restarted precedex   Interim History / Subjective:   Extubated yesterday. Received Zyprexa overnight. This morning was awake, calm, and responsive in bed. However, after getting up with PT, started becoming combative/agitated and required haldol. Afterwards placed back in bed with restraints.  Objective   Blood pressure (!) 142/87, pulse 66, temperature 98.8 F (37.1 C), temperature source Axillary, resp. rate (!) 35, height 5\' 10"  (1.778 m), weight 91.8 kg, SpO2 95%.        Intake/Output Summary (Last 24 hours) at 01/25/2023 0927 Last data filed at 01/25/2023 0600 Gross per 24 hour  Intake 824.73 ml  Output 1900 ml  Net -1075.27 ml   Filed Weights   01/23/23 0500 01/24/23 0500 01/25/23 0500  Weight: 95.3 kg 98.6 kg 91.8 kg   Examination: Gen: NAD, laying flat in bed, in restraints HEENT: NCAT CV: RRR no murmurs Pulm: CTAB, comfortable WOB on room air Neuro: Awake, alert, responsive. Oriented to self, setting, and time.  Ext: no significant edema. 4 point restraints. Moves all extremities spontaneously.     Latest Ref Rng & Units 01/25/2023    2:36 AM 01/24/2023    8:45 AM 01/23/2023    9:16 AM  CBC  WBC 4.0 - 10.5 K/uL 9.1  15.4  8.1   Hemoglobin 13.0 - 17.0 g/dL 19.1  47.8  29.5   Hematocrit 39.0 - 52.0 % 40.0  47.9  40.9   Platelets 150 - 400 K/uL 351  294  302  Latest Ref Rng & Units 01/25/2023    2:36 AM 01/24/2023    8:45 AM 01/23/2023    9:16 AM  CMP  Glucose 70 - 99 mg/dL 89  92  829   BUN 6 - 20 mg/dL 22  27  26    Creatinine 0.61 - 1.24 mg/dL 5.62  1.30  8.65   Sodium 135 - 145 mmol/L 136  137  135   Potassium 3.5 - 5.1 mmol/L 4.0  4.8  3.7   Chloride 98 - 111 mmol/L 102  103  102   CO2 22 - 32 mmol/L 24  21  24    Calcium 8.9 - 10.3 mg/dL 8.8  8.8  8.6   Total Protein 6.5 - 8.1 g/dL   6.0    Total Bilirubin 0.3 - 1.2 mg/dL   0.6   Alkaline Phos 38 - 126 U/L   44   AST 15 - 41 U/L   22   ALT 0 - 44 U/L   31     Sputum cx - klebsiella pneumoniae MRSA neg  Resolved Hospital Problem list   Shock (resolved. Mixed picture of sepsis and hypovolemia)   Assessment & Plan:   Acute metabolic encephalopathy due to alcohol withdrawal with severe delirium tremens complicated further by delirium  EEG negative for seizure, per neurology likely 2/2 severe withdrawal CT head and MRI negative Feel he is out of the window for withdrawal and his current behavior/agitation is more so related to delirium Plan Appreciate neurology (signed off) Continue precedex Decrease phenobarb to 65mg  q6 PRN olanzapine for agitation PRN oxycodone Off dilaudid, propofol, versed, ketamine Cont lexapro Cont seroquel to cover for possible delirium component  Continue thiamine and folate Avoid fever, keep euglycemic  Acute hypoxic respiratory insufficiency  Klebsiella pneumonia Mucous plugging w/ associated atelectasis  Extubated 8/29 Afebrile, leukocytosis resolved this morning Tachycardia resolved after extubation Sputum 8/26 growing K pneumoniae MRSA neg -688cc last 24h, +374cc since admit Plan DC IV lopressor Off ketamine, propofol, versed, dilaudid Continue Rocephin for total course of 7d s/p unasyn and zosyn Continue scheduled Brovana and Pulmicort As needed albuterol S/p 5 d solumedrol  PRN tylenol for fevers  Hypotension Stable, now off sedation and no longer requiring NE -monitor  Intermittent fluid and electrolyte imbalance  Plan monitor closely for refeeding syndrome given hx of ETOH abuse goal K> 4, Mag > 2   Chronic thrombocytopenia - likely from ETOH abuse Resolved  At risk for malnutrition Plan Appreciate SLP eval to advance diet  Possible hx of falls - CTH/ cervical neg Plan Monitor   Best Practice (right click and "Reselect all SmartList Selections" daily)    Diet/type: NPO pending SLP eval DVT prophylaxis: LMWH GI prophylaxis: N/A Lines: N/A Foley:  Yes, and it is still needed Code Status:  full code Last date of multidisciplinary goals of care discussion 8/28 updated family via phone        Vonna Drafts, MD

## 2023-01-25 NOTE — Progress Notes (Signed)
eLink Physician-Brief Progress Note Patient Name: Mukesh Wunschel DOB: Jul 18, 1965 MRN: [id number]   Date of Service  01/25/2023  HPI/Events of Note  RN reporting thrush.   eICU Interventions  Magic mouthwash ordered as per request.     Intervention Category Minor Interventions: Other:  Larinda Buttery 01/25/2023, 9:35 PM

## 2023-01-25 NOTE — Evaluation (Signed)
Physical Therapy Evaluation Patient Details Name: Jermaine [name] MRN: [id number] DOB: January 29, 1966 Today's Date: 01/25/2023  History of Present Illness  57 y/o male admitted 8/19 in ETOH withdrawal after binge drinking for several months. Pt requiring aggressive sedation and Intubated 8/21-8/29. 8/22 mucous plugging, seizure like activity. 8/23 CT head/MRI neg. PMHx: ETOH abuse, depression, anxiety  Clinical Impression  Pt initially calm, confused and appropriately responding and following commands. Pt able to transfer to EOB, stand and walk with min +2 assist for safety. Pt with statements of delusions and stating we were trying to trick him and trap him in a room when pt became agitated with gait. Able to return to room via recliner with increased agitation and ultimately 5 people and posey belt along with bil wrist restraints to contain in chair. RN gave haldol and able to perform min +2 transfer back to bed end of session with 5 pt restraints returned. Pt significantly limited by cognition and agitation. Pt will benefit from acute therapy as able to tolerate and participate to improved transfers, gait, function and  safety. Patient will benefit from continued inpatient follow up therapy, <3 hours/day as he states he lives alone.         If plan is discharge home, recommend the following: A lot of help with bathing/dressing/bathroom;Assistance with cooking/housework;Assist for transportation;Direct supervision/assist for medications management;Supervision due to cognitive status;A lot of help with walking and/or transfers   Can travel by private vehicle   No    Equipment Recommendations Rolling walker (2 wheels)  Recommendations for Other Services       Functional Status Assessment Patient has had a recent decline in their functional status and/or demonstrates limited ability to make significant improvements in function in a reasonable and predictable amount of time     Precautions /  Restrictions Precautions Precautions: Fall;Other (comment) Precaution Comments: agitation      Mobility  Bed Mobility Overal bed mobility: Needs Assistance Bed Mobility: Supine to Sit, Sit to Supine     Supine to sit: Min assist, +2 for safety/equipment Sit to supine: Min assist, +2 for safety/equipment   General bed mobility comments: pt able to transition from supine to sitting HOB 30 degrees, upon return to bed pt with min assist +3 for safety due to agitation    Transfers Overall transfer level: Needs assistance   Transfers: Sit to/from Stand Sit to Stand: Contact guard assist           General transfer comment: initial stand from bed with CGA, mod cues for hand placement with RW and assist for lines. Mod +2 to sit in chair with max cues with agitation during gait. Pt then attempting to stand from chair multiple times with agitation with +3 assist to maintain in chair. RN provided meds then pt able to stand with min +2 assist for pivot back to bed    Ambulation/Gait Ambulation/Gait assistance: Min assist, +2 safety/equipment Gait Distance (Feet): 40 Feet Assistive device: Rolling walker (2 wheels) Gait Pattern/deviations: Step-through pattern, Decreased stride length, Trunk flexed   Gait velocity interpretation: <1.8 ft/sec, indicate of risk for recurrent falls   General Gait Details: cues for posture, position in RW and safety. pt with rapid flip to agitation during gait letting go to RW and stating we were shoving him in a room (while in the hallway) +2 for chair follow, lines and safety  Stairs            Wheelchair Mobility     Tilt Bed  Modified Rankin (Stroke Patients Only)       Balance Overall balance assessment: Needs assistance Sitting-balance support: No upper extremity supported, Feet supported Sitting balance-Leahy Scale: Fair     Standing balance support: Bilateral upper extremity supported Standing balance-Leahy Scale:  Poor Standing balance comment: Rw for safety in standing                             Pertinent Vitals/Pain Pain Assessment Pain Assessment: No/denies pain    Home Living Family/patient expects to be discharged to:: Private residence Living Arrangements: Alone   Type of Home: Apartment Home Access: Level entry       Home Layout: One level Home Equipment: None      Prior Function Prior Level of Function : Independent/Modified Independent                     Extremity/Trunk Assessment   Upper Extremity Assessment Upper Extremity Assessment: Overall WFL for tasks assessed    Lower Extremity Assessment Lower Extremity Assessment: Overall WFL for tasks assessed    Cervical / Trunk Assessment Cervical / Trunk Assessment: Normal  Communication   Communication Communication: No apparent difficulties  Cognition Arousal: Alert Behavior During Therapy: Agitated, Impulsive Overall Cognitive Status: Impaired/Different from baseline Area of Impairment: Memory, Attention, Orientation, Awareness, Following commands, Safety/judgement, Problem solving                 Orientation Level: Disoriented to, Time, Situation Current Attention Level: Sustained Memory: Decreased short-term memory Following Commands: Follows one step commands inconsistently, Follows one step commands with increased time Safety/Judgement: Decreased awareness of deficits, Decreased awareness of safety Awareness: Intellectual Problem Solving: Requires verbal cues, Requires tactile cues, Slow processing General Comments: pt initially calm and able to answer questions and follow commands. Pt with quick change to agitation after limited gait in hall. In room pt with severe agitation requiring 5 person assist to restrain and haldol. pt oriented to place but thinks "people are giving me opiates from the ceiling", perseverating on calling mom Aurea Graff after gait        General Comments       Exercises     Assessment/Plan    PT Assessment Patient needs continued PT services  PT Problem List Decreased mobility;Decreased safety awareness;Decreased activity tolerance;Decreased balance;Decreased coordination;Decreased cognition;Decreased knowledge of use of DME       PT Treatment Interventions DME instruction;Therapeutic exercise;Gait training;Balance training;Functional mobility training;Cognitive remediation;Therapeutic activities;Patient/family education;Neuromuscular re-education    PT Goals (Current goals can be found in the Care Plan section)  Acute Rehab PT Goals Patient Stated Goal: return home PT Goal Formulation: With patient Time For Goal Achievement: 02/08/23 Potential to Achieve Goals: Fair    Frequency Min 1X/week     Co-evaluation               AM-PAC PT "6 Clicks" Mobility  Outcome Measure Help needed turning from your back to your side while in a flat bed without using bedrails?: A Little Help needed moving from lying on your back to sitting on the side of a flat bed without using bedrails?: A Little Help needed moving to and from a bed to a chair (including a wheelchair)?: A Lot Help needed standing up from a chair using your arms (e.g., wheelchair or bedside chair)?: A Lot Help needed to walk in hospital room?: Total Help needed climbing 3-5 steps with a railing? : Total 6 Click Score:  12    End of Session Equipment Utilized During Treatment: Gait belt Activity Tolerance: Treatment limited secondary to agitation Patient left: in bed;with call bell/phone within reach;with bed alarm set;with nursing/sitter in room;with restraints reapplied Nurse Communication: Mobility status;Other (comment) (agitation with mobility) PT Visit Diagnosis: Other abnormalities of gait and mobility (R26.89);Other symptoms and signs involving the nervous system (R29.898)    Time: 1610-9604 PT Time Calculation (min) (ACUTE ONLY): 39 min   Charges:   PT  Evaluation $PT Eval Moderate Complexity: 1 Mod PT Treatments $Therapeutic Activity: 23-37 mins PT General Charges $$ ACUTE PT VISIT: 1 Visit         Merryl Hacker, PT Acute Rehabilitation Services Office: [phone]   Enedina Finner Jess Sulak 01/25/2023, 9:20 AM

## 2023-01-25 NOTE — Progress Notes (Signed)
SLP Cancellation Note  Patient Details Name: Jermaine [name] MRN: [id number] DOB: [birth year]   Cancelled treatment:       Reason Eval/Treat Not Completed: Patient not medically ready. Given haldol, no significant improvement yet to warrant trials today. Cortrak placed. Will f/u next week unless SLP contacted over the weekend.    Birl Lobello, Riley Nearing 01/25/2023, 10:56 AM

## 2023-01-25 NOTE — Progress Notes (Signed)
Nutrition Follow-up  DOCUMENTATION CODES:   Not applicable  INTERVENTION:  Restart tube feeds via Cortrak; Vital AF 1.2 at 35 mL/hr and advance by 10 mL every 4 hours to goal rate of 65 mL/hr (1560 mL per day) Regimen provides 1872 kcal, 117 gm protein, and 1265 mL free water daily. Continue thiamine and Multivitamin w/ minerals daily Banatrol TF - BID  NUTRITION DIAGNOSIS:   Inadequate oral intake related to inability to eat as evidenced by NPO status. - Ongoing  GOAL:   Patient will meet greater than or equal to 90% of their needs - Not met  MONITOR:   Vent status, Labs, Weight trends, TF tolerance, I & O's  REASON FOR ASSESSMENT:   Consult Enteral/tube feeding initiation and management  ASSESSMENT:   57 y.o. male with PMHx including anxiety, depression, EtOH abuse who was admitted for alcohol withdrawal  8/21 - tube feeds started 8/22 - tube feeds kept at trickle rate 8/23 - transfer to [facility] for LTM, begin advancing TF to goal 8/26 - FMS placed 8/29 - Extubated; failed bedside swallow 8/30 - Cortrak placed (tip stomach)  Spoke with RN, pt became very agitated while working with PT and required restraints in bed. Cortrak tube placed.  Discussed restarting feeds with Dr. Barb Merino; MD agreeable since pt is not able to have diet advanced.   Admission Weight: 92.1 kg Current Weight: 91.8 kg   Medications reviewed and include: MVI, Thiamine, IV antibiotics, Precedex Labs reviewed: Sodium 136, Potassium 4.0, BUN 22, Creatinine 0.94  UOP: 2025 mL x 24 hrs  Diet Order:   Diet Order             Diet NPO time specified Except for: Ice Chips  Diet effective now                  EDUCATION NEEDS:   Not appropriate for education at this time  Skin:  Skin Assessment: Reviewed RN Assessment  Last BM:  8/29  via FMS - 100 mL  Height:  Ht Readings from Last 1 Encounters:  01/15/23 5\' 10"  (1.778 m)   Weight:  Wt Readings from Last 1 Encounters:  01/25/23  91.8 kg   Ideal Body Weight:  75.5 kg  BMI:  Body mass index is 29.04 kg/m.  Estimated Nutritional Needs:  Kcal:  1900-2100 Protein:  110-130 grams Fluid:  >/= 2L   Kirby Crigler RD, LDN Clinical Dietitian See [facility] for contact information.

## 2023-01-26 LAB — BASIC METABOLIC PANEL
Anion gap: 10 (ref 5–15)
BUN: 20 mg/dL (ref 6–20)
CO2: 24 mmol/L (ref 22–32)
Calcium: 8.9 mg/dL (ref 8.9–10.3)
Chloride: 103 mmol/L (ref 98–111)
Creatinine, Ser: 0.94 mg/dL (ref 0.61–1.24)
GFR, Estimated: >60 mL/min (ref >60)
Glucose, Bld: 116 mg/dL — ABNORMAL HIGH (ref 70–99)
Potassium: 3.8 mmol/L (ref 3.5–5.1)
Sodium: 137 mmol/L (ref 135–145)

## 2023-01-26 LAB — CBC
HCT: 40.9 % (ref 39.0–52.0)
Hemoglobin: 13.8 g/dL (ref 13.0–17.0)
MCH: 32 pg (ref 26.0–34.0)
MCHC: 33.7 g/dL (ref 30.0–36.0)
MCV: 94.9 fL (ref 80.0–100.0)
Platelets: 415 10*3/uL — ABNORMAL HIGH (ref 150–400)
RBC: 4.31 MIL/uL (ref 4.22–5.81)
RDW: 12.6 % (ref 11.5–15.5)
WBC: 7.4 10*3/uL (ref 4.0–10.5)
nRBC: 0 % (ref 0.0–0.2)

## 2023-01-26 LAB — PHOSPHORUS: Phosphorus: 2.2 mg/dL — ABNORMAL LOW (ref 2.5–4.6)

## 2023-01-26 LAB — MAGNESIUM: Magnesium: 2.1 mg/dL (ref 1.7–2.4)

## 2023-01-26 MED ORDER — HALOPERIDOL LACTATE 5 MG/ML IJ SOLN
INTRAMUSCULAR | Status: AC
  Filled 2023-01-26: qty 1

## 2023-01-26 MED ORDER — LORAZEPAM 2 MG/ML IJ SOLN
1.0000 mg | INTRAMUSCULAR | Status: DC | PRN
  Administered 2023-01-26 – 2023-01-27 (×3): 1 mg via INTRAVENOUS
  Filled 2023-01-26 (×2): qty 1

## 2023-01-26 MED ORDER — ACETAMINOPHEN 160 MG/5ML PO SOLN: 650.0000 mg | Freq: Four times a day (QID) | ORAL | Status: DC | PRN

## 2023-01-26 MED ORDER — LORAZEPAM 1 MG PO TABS
1.0000 mg | ORAL_TABLET | ORAL | Status: DC | PRN
  Administered 2023-01-26: 1 mg via ORAL
  Filled 2023-01-26: qty 1

## 2023-01-26 MED ORDER — THIAMINE MONONITRATE 100 MG PO TABS
100.0000 mg | ORAL_TABLET | Freq: Every day | ORAL | Status: DC
  Administered 2023-01-28: 100 mg via ORAL
  Filled 2023-01-26: qty 1

## 2023-01-26 MED ORDER — PHENOBARBITAL SODIUM 65 MG/ML IJ SOLN
65.0000 mg | Freq: Once | INTRAMUSCULAR | Status: AC
  Administered 2023-01-26: 65 mg via INTRAVENOUS
  Filled 2023-01-26: qty 1

## 2023-01-26 MED ORDER — GUAIFENESIN 100 MG/5ML PO LIQD
15.0000 mL | ORAL | Status: DC
  Administered 2023-01-26 – 2023-01-28 (×7): 15 mL via ORAL
  Filled 2023-01-26: qty 20
  Filled 2023-01-26: qty 15
  Filled 2023-01-26 (×6): qty 20

## 2023-01-26 MED ORDER — QUETIAPINE FUMARATE 100 MG PO TABS
100.0000 mg | ORAL_TABLET | Freq: Two times a day (BID) | ORAL | Status: DC
  Administered 2023-01-26: 100 mg via ORAL
  Filled 2023-01-26: qty 1

## 2023-01-26 MED ORDER — PHENOBARBITAL 32.4 MG PO TABS: 64.8000 mg | ORAL_TABLET | Freq: Four times a day (QID) | ORAL | Status: DC

## 2023-01-26 MED ORDER — ESCITALOPRAM OXALATE 10 MG PO TABS
20.0000 mg | ORAL_TABLET | Freq: Every day | ORAL | Status: DC
  Administered 2023-01-28: 20 mg via ORAL
  Filled 2023-01-26: qty 2

## 2023-01-26 MED ORDER — CARMEX CLASSIC LIP BALM EX OINT
TOPICAL_OINTMENT | CUTANEOUS | Status: DC | PRN
  Filled 2023-01-26: qty 10

## 2023-01-26 MED ORDER — PHENOBARBITAL 32.4 MG PO TABS
64.8000 mg | ORAL_TABLET | Freq: Two times a day (BID) | ORAL | Status: DC
  Administered 2023-01-26 – 2023-01-28 (×3): 64.8 mg via ORAL
  Filled 2023-01-26 (×3): qty 2

## 2023-01-26 MED ORDER — HALOPERIDOL LACTATE 5 MG/ML IJ SOLN
5.0000 mg | Freq: Once | INTRAMUSCULAR | Status: AC | PRN
  Administered 2023-01-26: 5 mg via INTRAVENOUS

## 2023-01-26 MED ORDER — ADULT MULTIVITAMIN W/MINERALS CH
1.0000 | ORAL_TABLET | Freq: Every day | ORAL | Status: DC
  Administered 2023-01-28: 1 via ORAL
  Filled 2023-01-26: qty 1

## 2023-01-26 MED ORDER — POTASSIUM PHOSPHATES 15 MMOLE/5ML IV SOLN
15.0000 mmol | Freq: Once | INTRAVENOUS | Status: AC
  Administered 2023-01-26: 15 mmol via INTRAVENOUS
  Filled 2023-01-26: qty 5

## 2023-01-26 NOTE — Plan of Care (Signed)
  Problem: Education: Goal: Knowledge of General Education information will improve Description: Including pain rating scale, medication(s)/side effects and non-pharmacologic comfort measures Outcome: Not Progressing   Problem: Health Behavior/Discharge Planning: Goal: Ability to manage health-related needs will improve Outcome: Not Progressing   Problem: Clinical Measurements: Goal: Ability to maintain clinical measurements within normal limits will improve Outcome: Not Progressing   Problem: Activity: Goal: Risk for activity intolerance will decrease Outcome: Not Progressing   Problem: Nutrition: Goal: Adequate nutrition will be maintained Outcome: Progressing   Problem: Coping: Goal: Level of anxiety will decrease Outcome: Not Progressing

## 2023-01-26 NOTE — Progress Notes (Signed)
NAME:  Jermaine [name], MRN:  [id number], DOB:  08-01-1965, LOS: 12 ADMISSION DATE:  01/14/2023, CONSULTATION DATE:  01/16/23 REFERRING MD:  TRH, CHIEF COMPLAINT:  ETOH withdrawal   History of Present Illness:  57 year old male with PMH of anxiety, depression, and ETOH abuse who presented 8/19 after reports of binge drinking for the last 2-3 months and decided to quit.  Last ETOH 8/19.  Admitted to [facility] in active ETOH withdrawal, ETOH 342 on admit, started on withdrawal protocol, however worsening agitation and secretions overnight 8/20, requiring 4L [place].  Was started on precedex but CIWAs remained 18-43.  Phenobarbital taper ordered by but not able to be given while NPO.  PCCM consulted for further evaluation.  Required intubation for respiratory secretions and airway protection.   Pertinent  Medical History   Past Medical History:  Diagnosis Date   Alcohol abuse    Anxiety    Class 1 obesity 09/14/2021   Depression     Significant Hospital Events: Including procedures, antibiotic start and stop dates in addition to other pertinent events   8/19 admitted ETOH withdrawal 8/21 PCCM consult, intubated, fever, abx for aspiration 8/22 mucous plugging, seizure like activity, CTH/ MRI brain, ceribell neg 8/23 seen by neurology.  All EEGs negative.  No evidence of seizure.  Felt to be primarily metabolic encephalopathy 8/24 EEGs all negative for seizure.  Weaning fentanyl to off, weaning Versed, phenobarb dose adjusted 8/25 completing versed wean (increased to more rapid wean), cont scheduled q 6 phenobarb. Added seroquel. Tolerating PSV working towards vent liberation  8/26 DC precedex. Off steroids and antibiotics. Restarting propofol, continue versed wean, continue phenobarb and seroquel. Lasix 40mg  IV. Started ketamine due to continued agitation. Became febrile in PM > abx switched from Unasyn to Vanc/Zosyn 8/27 persistently febrile, ?drug-induced. DC ketamine, started continuous hydromorphone.  Lasix 40mg  IV. DC vanc due to neg MRSA. Sputum cx grew K pneumoniae 8/28 afebrile off ketamine. Weaning versed. Switched to ceftriaxone. 8/29 off sedation. Extubated. Restarted precedex 8/30 requiring intermittent Haldol 8/31 no issues overnight mentation improved and is now out of restraints  Interim History / Subjective:  Able to state name and location this a.m. reports he feels " fuzzy" mentally  Objective   Blood pressure 130/83, pulse 60, temperature 98.4 F (36.9 C), temperature source Oral, resp. rate (!) 26, height 5\' 10"  (1.778 m), weight 91.8 kg, SpO2 97%.        Intake/Output Summary (Last 24 hours) at 01/26/2023 8295 Last data filed at 01/26/2023 0700 Gross per 24 hour  Intake 2354.12 ml  Output 1850 ml  Net 504.12 ml   Filed Weights   01/24/23 0500 01/25/23 0500 01/26/23 0342  Weight: 98.6 kg 91.8 kg 91.8 kg   Examination: General: Acute on chronic ill-appearing elderly male lying in bed in no acute distress HEENT: [place]/AT, MM pink/moist, PERRL,  Neuro: Alert and oriented x 1, nonfocal CV: s1s2 regular rate and rhythm, no murmur, rubs, or gallops,  PULM: Clear to auscultation bilaterally, no increased work of breathing, no added breath sounds GI: soft, bowel sounds active in all 4 quadrants, non-tender, non-distended, tolerating TF Extremities: warm/dry, no edema  Skin: no rashes or lesions  Resolved Hospital Problem list   Shock (resolved. Mixed picture of sepsis and hypovolemia)  Alcohol withdrawal with severe DTs Seizure-like activity Acute hypoxic respiratory insufficiency  Mucous plugging w/ associated atelectasis  Extubated 8/29 Hypotension Thrombocytopenia  Assessment & Plan:   Acute metabolic encephalopathy  -EEG negative for seizure, per  neurology likely 2/2 severe withdrawal -CT head and MRI negative -He is now out of the window for any further alcohol withdrawal, ongoing confusion likely ICU delirium/medication effect  P: Seizure  precautions Continue scheduled Seroquel, scheduled phenobarbital and Lexapro As needed Zyprexa As needed opioids Continue to supplement thiamine folate and multivitamin Wean Precedex as able  Klebsiella pneumonia -Sputum 8/26 growing K pneumoniae P: Continue ceftriaxone x 5 days Aspiration precautions Continue cortrak till able to safely consume orals  Intermittent fluid and electrolyte imbalance  P: Supplement as needed Potassium goal greater than 4, mag greater than 2  At risk for malnutrition P: Continue tube feeds, as mentation allows increase oral diet  Possible hx of falls - CTH/ cervical neg P: PT/OT efforts as able  Best Practice (right click and "Reselect all SmartList Selections" daily)   Diet/type: NPO pending SLP eval DVT prophylaxis: LMWH GI prophylaxis: N/A Lines: N/A Foley:  Yes, and it is still needed Code Status:  full code Last date of multidisciplinary goals of care discussion 8/28 updated family via phone  Critical care  CRITICAL CARE Performed by: Tanee Henery D. Harris  Total critical care time: 38 minutes  Critical care time was exclusive of separately billable procedures and treating other patients.  Critical care was necessary to treat or prevent imminent or life-threatening deterioration.  Critical care was time spent personally by me on the following activities: development of treatment plan with patient and/or surrogate as well as nursing, discussions with consultants, evaluation of patient's response to treatment, examination of patient, obtaining history from patient or surrogate, ordering and performing treatments and interventions, ordering and review of laboratory studies, ordering and review of radiographic studies, pulse oximetry and re-evaluation of patient's condition.  Aubre Quincy D. Harris, NP-C [facility] Personal contact information can be found on Amion  If no contact or response made please call  [phone] 01/26/2023, 8:42 AM

## 2023-01-26 NOTE — Progress Notes (Signed)
eLink Physician-Brief Progress Note Patient Name: Crystian Timbers DOB: [birth year] MRN: [id number]   Date of Service  01/26/2023  HPI/Events of Note  Notified of increased agitation despite precedex gtt and zyprexa.  On camera assessment, pt is more calm and has settled down.    eICU Interventions  Haldol IV PRN ordered.     Intervention Category Minor Interventions: Agitation / anxiety - evaluation and management  Larinda Buttery 01/26/2023, 1:35 AM

## 2023-01-27 ENCOUNTER — Inpatient Hospital Stay (HOSPITAL_COMMUNITY): Payer: BLUE CROSS/BLUE SHIELD

## 2023-01-27 MED ORDER — CLONIDINE HCL 0.1 MG PO TABS
0.1000 mg | ORAL_TABLET | Freq: Every day | ORAL | Status: DC
  Administered 2023-01-28: 0.1 mg via ORAL
  Filled 2023-01-27: qty 1

## 2023-01-27 MED ORDER — FOLIC ACID 1 MG PO TABS
1.0000 mg | ORAL_TABLET | Freq: Every day | ORAL | Status: DC
  Administered 2023-01-28: 1 mg via ORAL
  Filled 2023-01-27: qty 1

## 2023-01-27 MED ORDER — LACTATED RINGERS IV SOLN: INTRAVENOUS | Status: DC

## 2023-01-27 MED ORDER — LORAZEPAM 2 MG/ML IJ SOLN
1.0000 mg | INTRAMUSCULAR | Status: DC | PRN
  Administered 2023-01-27: 2 mg via INTRAVENOUS
  Filled 2023-01-27: qty 1

## 2023-01-27 MED ORDER — CARBAMAZEPINE 200 MG PO TABS
200.0000 mg | ORAL_TABLET | Freq: Two times a day (BID) | ORAL | Status: DC
  Administered 2023-01-27 – 2023-01-28 (×2): 200 mg via ORAL
  Filled 2023-01-27 (×4): qty 1

## 2023-01-27 MED ORDER — LORAZEPAM 1 MG PO TABS
1.0000 mg | ORAL_TABLET | ORAL | Status: DC | PRN
  Administered 2023-01-27: 1 mg via ORAL
  Filled 2023-01-27: qty 1

## 2023-01-27 NOTE — Plan of Care (Signed)
  Problem: Education: Goal: Knowledge of General Education information will improve Description: Including pain rating scale, medication(s)/side effects and non-pharmacologic comfort measures Outcome: Not Progressing   Problem: Health Behavior/Discharge Planning: Goal: Ability to manage health-related needs will improve Outcome: Not Progressing   Problem: Clinical Measurements: Goal: Ability to maintain clinical measurements within normal limits will improve Outcome: Not Progressing Goal: Will remain free from infection Outcome: Not Progressing Goal: Diagnostic test results will improve Outcome: Not Progressing Goal: Respiratory complications will improve Outcome: Not Progressing Goal: Cardiovascular complication will be avoided Outcome: Not Progressing   Problem: Activity: Goal: Risk for activity intolerance will decrease Outcome: Not Progressing   Problem: Nutrition: Goal: Adequate nutrition will be maintained Outcome: Not Progressing   Problem: Coping: Goal: Level of anxiety will decrease Outcome: Not Progressing   Problem: Elimination: Goal: Will not experience complications related to bowel motility Outcome: Not Progressing Goal: Will not experience complications related to urinary retention Outcome: Not Progressing   Problem: Pain Managment: Goal: General experience of comfort will improve Outcome: Not Progressing   Problem: Safety: Goal: Ability to remain free from injury will improve Outcome: Not Progressing   Problem: Skin Integrity: Goal: Risk for impaired skin integrity will decrease Outcome: Not Progressing   Problem: Safety: Goal: Non-violent Restraint(s) Outcome: Not Progressing   Problem: Activity: Goal: Ability to tolerate increased activity will improve Outcome: Not Progressing   Problem: Respiratory: Goal: Ability to maintain a clear airway and adequate ventilation will improve Outcome: Not Progressing   Problem: Role  Relationship: Goal: Method of communication will improve Outcome: Not Progressing

## 2023-01-27 NOTE — Progress Notes (Signed)
Patient is here for alcohol withdrawals. Was transferred here from [facility] after stopping Precedex on yesterday. Patient has been trying to get out of bed and having visual hallucinations.Pulled off all of tele leads, pulse ox and bp cuff. Exhausted all meds on patient and he continues to hallucinate and trying to get out of bed often. Not redirectable. Contacted MD on call. Received order for sitter and an order for ativan based on the CIWA scale. ICU providers came onto unit to assess patient. Patient was calm during that time, however, had been restless and agitated before providers arrived.

## 2023-01-27 NOTE — Assessment & Plan Note (Signed)
Completed your course of antibiotics.  At present no fever or leukocytosis.  High risk for aspiration again however.

## 2023-01-27 NOTE — Assessment & Plan Note (Signed)
Continue escitalopram 

## 2023-01-27 NOTE — Hospital Course (Signed)
Mr. Jermaine [name] is a 57 y.o. M with hx alcohol dependence, complicated EtOH withdrawal, and depression who presented after few months binging and desiring to stop alcohol.  EtOH >300 on presentation and already in withdrawal.   8/19: Adimtted to hospitalist service 8/21: Escalating confusion, patient intubated and transferred to ICU, fevering 8/22: Neurology consulted, EEG and MRI unremarkable 8/23-28: S/p propofol, ketamine, Precedex  8/29: Extubated 8/31: Transferred [facility]     Significant studies: 8/22 MRI brain: unremarkable 8/22 EEG: encephalopathy 8/23 Echo: normal EF, normal valves 8/24 overnight EEG: nonspecific encephalopathy   Significant microbiology data: 8/20 MRSA nares: negative 8/20 COVID: negative 8/21 Sputum: NG 8/22 Blood cx x2: NG 8/22 Urine cx: NG 8/26 Sputum: Klebsiella 8/26 MRSA nares: negative    Procedures: 8/21: Intubation 8/29: Extubation 8/30: Cortrak insertion 8/31: Cortrak removal    Consults: Neurology Psychiatry

## 2023-01-27 NOTE — Consult Note (Signed)
Patient is too sleepy, sedated, lethargic and unable to participate in psychiatric evaluation this morning. However, medication review revealed that patient is on Lorazepam withdrawal protocol and Seroquel 100 mg twice daily which may be contributing to the sedating effects of Benzodiazepine. Consultation request is based on medication assistance.  Recommendations: Verbally communicated to the hospitalist -D/C Seroquel 100 mg twice daily due to excessive sedation when combine with Benzodiazepine -Consider adding Carbamazepine 200 mg twice daily for alcohol withdrawal and to prevent psychomotor agitation, DTs and seizures. Monitor patient for side effects such as skin rashes. -Continue Lorazepam alcohol withdrawal protocol -Re-consult psych consult service as needed  Thedore Mins, MD Attending psychiatrist

## 2023-01-27 NOTE — Assessment & Plan Note (Addendum)
Respiratory insufficiency due to alcohol delirium Respiratory failure ruled out Ongoing alcohol withdrawal. - Continue phenobarbital - Continue PRN Ativan - Stop seroquel - Replace with Tegretol - Continue thimaine, MVI -Continue clonidine - Start folate

## 2023-01-27 NOTE — Progress Notes (Signed)
Patient is now asleep. No interventions needed and CIWA is 0.

## 2023-01-27 NOTE — Assessment & Plan Note (Signed)
Consult TOC.

## 2023-01-27 NOTE — Progress Notes (Signed)
Patient trying to get out of bed and hallucinating again. CIWA score is 12. Will administer ativan 2mg .

## 2023-01-27 NOTE — Progress Notes (Signed)
  Progress Note   Patient: Jermaine [name] BJY:[id number] DOB: Sep 23, 1965 DOA: 01/14/2023     13 DOS: the patient was seen and examined on 01/27/2023 at 10:36 AM      Brief hospital course: Mr. Scheuneman is a 57 y.o. M with hx alcohol dependence, complicated EtOH withdrawal, and depression who presented after few months binging and desiring to stop alcohol.  EtOH >300 on presentation and already in withdrawal.   8/19: Adimtted to hospitalist service 8/21: Escalating confusion, patient intubated and transferred to ICU, fevering 8/22: Neurology consulted, EEG and MRI unremarkable 8/23-28: S/p propofol, ketamine, Precedex  8/29: Extubated 8/31: Transferred [facility]     Significant studies: 8/22 MRI brain: unremarkable 8/22 EEG: encephalopathy 8/23 Echo: normal EF, normal valves 8/24 overnight EEG: nonspecific encephalopathy   Significant microbiology data: 8/20 MRSA nares: negative 8/20 COVID: negative 8/21 Sputum: NG 8/22 Blood cx x2: NG 8/22 Urine cx: NG 8/26 Sputum: Klebsiella 8/26 MRSA nares: negative    Procedures: 8/21: Intubation 8/29: Extubation 8/30: Cortrak insertion 8/31: Cortrak removal    Consults: Neurology Psychiatry      Assessment and Plan: * Acute metabolic encephalopathy due to alcohol withdrawal delirium requiring mechanical ventilation Respiratory insufficiency due to alcohol delirium Respiratory failure ruled out Ongoing alcohol withdrawal. - Continue phenobarbital - Continue PRN Ativan - Stop seroquel - Replace with Tegretol - Continue thimaine, MVI -Continue clonidine - Start folate    Pneumonia of right lower lobe due to Klebsiella pneumoniae [facility]) Completed your course of antibiotics.  At present no fever or leukocytosis.  High risk for aspiration again however.  Anxiety and depression - Continue escitalopram  Thrombocytopenia (HCC)    Chronic alcohol abuse - Consult TOC          Subjective: Patient is somnolent and  difficult to arouse, opens his eyes and stares sluggishly then falls back asleep.  Nursing note that he has been somnolent and sleeping/snoring since shift change.  Had an apneic event around 930, which resolved and he is now on 2 L oxygen, and appears stable and comfortable/sleeping     Physical Exam: BP 137/84   Pulse 93   Temp 98.3 F (36.8 C) (Axillary)   Resp (!) 23   Ht 5\' 10"  (1.778 m)   Wt 87.9 kg   SpO2 97%   BMI 27.81 kg/m   Adult male, hygiene appears poor, somnolent, snoring RRR, no murmurs, no peripheral edema Respiratory rate seems slightly increased, he is sleeping/snoring comfortably, no wheezes or rales appreciated on my exam Abdomen without grimace to palpation or rigidity Somnolent and does not respond to questions    Data Reviewed: Discussed case with psychiatry Patient metabolic panel and CBC yesterday were unremarkable       Disposition: Status is: Inpatient         Author: Alberteen Sam, MD 01/27/2023 1:32 PM  For on call review [email or web address].

## 2023-01-27 NOTE — Progress Notes (Signed)
PCCM INTERVAL PROGRESS NOTE   Called to bedisde to evaluate patient with agitation  76M with ETOH history admitting 8/19 with withdrawal. Requiring intubation and precedex infusion. Was very difficult to sedate due to ongoing agitation. Extubated 8/29. Precedex was discontinued 8/31 and he was transferred out of the unit. Now overnight he has been agitated and climbing out of bed posing risk to himself despite maximization of the ordered PRN agents.  Add CIWA dose ativan Safety sitter Add clonidine for smoother transition from precedex.     Joneen Roach, AGACNP-BC [facility]  See Amion for personal pager PCCM on call pager [phone] until 7pm. Please call Elink 7p-7a. [phone]  01/27/2023 3:30 AM

## 2023-01-28 ENCOUNTER — Other Ambulatory Visit: Payer: Self-pay | Admitting: Family Medicine

## 2023-01-28 LAB — COMPREHENSIVE METABOLIC PANEL
ALT: 93 U/L — ABNORMAL HIGH (ref 0–44)
AST: 60 U/L — ABNORMAL HIGH (ref 15–41)
Albumin: 3.3 g/dL — ABNORMAL LOW (ref 3.5–5.0)
Alkaline Phosphatase: 57 U/L (ref 38–126)
Anion gap: 7 (ref 5–15)
BUN: 16 mg/dL (ref 6–20)
CO2: 26 mmol/L (ref 22–32)
Calcium: 8.9 mg/dL (ref 8.9–10.3)
Chloride: 105 mmol/L (ref 98–111)
Creatinine, Ser: 0.87 mg/dL (ref 0.61–1.24)
GFR, Estimated: >60 mL/min (ref >60)
Glucose, Bld: 103 mg/dL — ABNORMAL HIGH (ref 70–99)
Potassium: 3.6 mmol/L (ref 3.5–5.1)
Sodium: 138 mmol/L (ref 135–145)
Total Bilirubin: 0.5 mg/dL (ref 0.3–1.2)
Total Protein: 7.1 g/dL (ref 6.5–8.1)

## 2023-01-28 LAB — CBC
HCT: 43.8 % (ref 39.0–52.0)
Hemoglobin: 15 g/dL (ref 13.0–17.0)
MCH: 33 pg (ref 26.0–34.0)
MCHC: 34.2 g/dL (ref 30.0–36.0)
MCV: 96.3 fL (ref 80.0–100.0)
Platelets: 541 10*3/uL — ABNORMAL HIGH (ref 150–400)
RBC: 4.55 MIL/uL (ref 4.22–5.81)
RDW: 12.5 % (ref 11.5–15.5)
WBC: 10.3 10*3/uL (ref 4.0–10.5)
nRBC: 0 % (ref 0.0–0.2)

## 2023-01-28 MED ORDER — GABAPENTIN 400 MG PO CAPS: 400.0000 mg | ORAL_CAPSULE | Freq: Three times a day (TID) | ORAL | 1 refills | Status: DC

## 2023-01-28 MED ORDER — FOLIC ACID 1 MG PO TABS: 1.0000 mg | ORAL_TABLET | Freq: Every day | ORAL | Status: DC

## 2023-01-28 MED ORDER — GABAPENTIN 400 MG PO CAPS: 400.0000 mg | ORAL_CAPSULE | Freq: Three times a day (TID) | ORAL | Status: DC

## 2023-01-28 MED ORDER — BANATROL TF EN LIQD
60.0000 mL | Freq: Two times a day (BID) | ENTERAL | Status: DC
  Filled 2023-01-28: qty 60

## 2023-01-28 MED ORDER — GABAPENTIN 400 MG PO CAPS
400.0000 mg | ORAL_CAPSULE | Freq: Once | ORAL | Status: AC
  Administered 2023-01-28: 400 mg via ORAL
  Filled 2023-01-28: qty 1

## 2023-01-28 MED ORDER — NALTREXONE HCL 50 MG PO TABS: 50.0000 mg | ORAL_TABLET | Freq: Every day | ORAL | 2 refills | Status: DC

## 2023-01-28 MED ORDER — HYDROXYZINE HCL 25 MG PO TABS: 25.0000 mg | ORAL_TABLET | Freq: Three times a day (TID) | ORAL | 0 refills | Status: DC | PRN

## 2023-01-28 NOTE — Progress Notes (Signed)
Discharged home accompanied by a friend . Discharge instructions given to pt.

## 2023-01-28 NOTE — Plan of Care (Signed)
  Problem: Education: Goal: Knowledge of General Education information will improve Description: Including pain rating scale, medication(s)/side effects and non-pharmacologic comfort measures Outcome: Progressing   Problem: Health Behavior/Discharge Planning: Goal: Ability to manage health-related needs will improve Outcome: Progressing   Problem: Clinical Measurements: Goal: Ability to maintain clinical measurements within normal limits will improve Outcome: Progressing Goal: Will remain free from infection Outcome: Progressing Goal: Diagnostic test results will improve Outcome: Progressing Goal: Respiratory complications will improve Outcome: Progressing Goal: Cardiovascular complication will be avoided Outcome: Progressing   Problem: Activity: Goal: Risk for activity intolerance will decrease Outcome: Progressing   Problem: Nutrition: Goal: Adequate nutrition will be maintained Outcome: Progressing   Problem: Coping: Goal: Level of anxiety will decrease Outcome: Progressing   Problem: Elimination: Goal: Will not experience complications related to bowel motility Outcome: Progressing Goal: Will not experience complications related to urinary retention Outcome: Progressing   Problem: Pain Managment: Goal: General experience of comfort will improve Outcome: Progressing   Problem: Safety: Goal: Ability to remain free from injury will improve Outcome: Progressing   Problem: Skin Integrity: Goal: Risk for impaired skin integrity will decrease Outcome: Progressing   Problem: Safety: Goal: Non-violent Restraint(s) Outcome: Progressing   Problem: Activity: Goal: Ability to tolerate increased activity will improve Outcome: Progressing   Problem: Respiratory: Goal: Ability to maintain a clear airway and adequate ventilation will improve Outcome: Progressing   Problem: Role Relationship: Goal: Method of communication will improve Outcome: Progressing   

## 2023-01-28 NOTE — Progress Notes (Signed)
Speech Language Pathology Treatment: Dysphagia  Patient Details Name: Jermaine [name] MRN: [id number] DOB: December 30, 1965 Today's Date: 01/28/2023 Time: 8413-2440 SLP Time Calculation (min) (ACUTE ONLY): 8 min  Assessment / Plan / Recommendation Clinical Impression  Pt up and walking around unit. Tolerating meals well. Vocal quality WNL. Observed pt with consecutive sips of thin liquids. No signs of dysphagia. Will sign off.   HPI HPI: 57 year old male with PMH of anxiety, depression, and ETOH abuse who presented 8/19 after reports of binge drinking for the last 2-3 months and decided to quit.  Last ETOH 8/19.  Admitted to [facility] in active ETOH withdrawal, ETOH 342 on admit, started on withdrawal protocol, however worsening agitation and secretions overnight 8/20, requiring 4L [place].  Was started on precedex but CIWAs remained 18-43.  Phenobarbital taper ordered by but not able to be given while NPO.   Required intubation for respiratory secretions and airway protection. 8/21-8/29      SLP Plan  All goals met      Recommendations for follow up therapy are one component of a multi-disciplinary discharge planning process, led by the attending physician.  Recommendations may be updated based on patient status, additional functional criteria and insurance authorization.    Recommendations  Diet recommendations: Thin liquid;Regular                              All goals met     Claudine Mouton  01/28/2023, 11:12 AM

## 2023-01-28 NOTE — Discharge Summary (Signed)
Physician Discharge Summary   Patient: Jermaine [name] MRN: [id number] DOB: [birth year]  Admit date:     01/14/2023  Discharge date: 01/28/23  Discharge Physician: Alberteen Sam   PCP: Farris Has, MD     Recommendations at discharge:  Follow up with PCP Dr. Kateri Plummer in 1 week Dr. Kateri Plummer: Please taper off gabapentin in 2 months Continue naltrexone as appropriate or refer for substance use treatment follow up     Discharge Diagnoses: Principal Problem:   Alcohol withdrawal delirium requiring mechanical ventilation Active Problems:   Acute metabolic encephalopathy    Chronic alcohol abuse   Thrombocytopenia (HCC)   Anxiety and depression   Pneumonia of right lower lobe due to Klebsiella pneumoniae [facility])    Hospital Course: Jermaine [name] is a 57 y.o. M with hx alcohol dependence, complicated EtOH withdrawal, and depression who presented after few months binging and desiring to stop alcohol.  EtOH >300 on presentation and already in withdrawal.   8/19: Adimtted to hospitalist service 8/21: Escalating confusion, patient intubated and transferred to ICU, fevering 8/22: Neurology consulted, EEG and MRI unremarkable 8/23-28: S/p propofol, ketamine, Precedex  8/29: Extubated 8/31: Transferred [facility] 9/1: Somnolent but improving gradually 9/2: Mentating well, ambulating well, oral intake normal, discharged on gabapentin     Significant studies: 8/22 MRI brain: unremarkable 8/22 EEG: encephalopathy 8/23 Echo: normal EF, normal valves 8/24 overnight EEG: nonspecific encephalopathy   Significant microbiology data: 8/20 MRSA nares: negative 8/20 COVID: negative 8/21 Sputum: NG 8/22 Blood cx x2: NG 8/22 Urine cx: NG 8/26 Sputum: Klebsiella 8/26 MRSA nares: negative    Procedures: 8/21: Intubation 8/29: Extubation 8/30: Cortrak insertion 8/31: Cortrak removal    Consults: Neurology Psychiatry                   [facility] Controlled  Substances Registry was reviewed for this patient prior to discharge.  Consultants:  Critical Care Psychiatry   Disposition: Home Diet recommendation:  Discharge Diet Orders (From admission, onward)     Start     Ordered   01/28/23 0000  Diet - low sodium heart healthy        01/28/23 1401             DISCHARGE MEDICATION: Allergies as of 01/28/2023       Reactions   Ketamine    Hx of causing fever in 2024         Medication List     STOP taking these medications    LORazepam 1 MG tablet Commonly known as: ATIVAN       TAKE these medications    escitalopram 20 MG tablet Commonly known as: LEXAPRO Take 20 mg by mouth daily.   folic acid 1 MG tablet Commonly known as: FOLVITE Take 1 tablet (1 mg total) by mouth daily.   gabapentin 400 MG capsule Commonly known as: NEURONTIN Take 1 capsule (400 mg total) by mouth 3 (three) times daily.   naltrexone 50 MG tablet Commonly known as: DEPADE Take 1 tablet (50 mg total) by mouth daily.   hydrOXYzine 25 MG tablet Commonly known as: ATARAX Take 1 tablet (25 mg total) by mouth 3 (three) times daily as needed for itching or anxiety.   multivitamin with minerals Tabs tablet Take 1 tablet by mouth daily.   thiamine 100 MG tablet Commonly known as: Vitamin B-1 Take 1 tablet (100 mg total) by mouth daily.        Follow-up Information  Farris Has, MD. Schedule an appointment as soon as possible for a visit in 1 week(s).   Specialty: Family Medicine Contact information: [address] [place] [address] [phone]                 Discharge Instructions     Diet - low sodium heart healthy   Complete by: As directed    Discharge instructions   Complete by: As directed    **IMPORTANT DISCHARGE INSTRUCTIONS**   From Dr. Maryfrances Bunnell:  To treat symptoms of withdrawal, take gabapentin 400 mg three times daily, starting tonight Take this for 2 months When you are  close to the end of the second month (like 10 tablets left), taper off of it:  Reduce to twice daily for 3 days Then once daily for 4 days then stop    Also, if you are having hard to control anxiety, try a hydroxyzine  Take folate and thiamine supplements  Resume your home medicines and see Dr. Kateri Plummer in 1 week If you need help with sleep, take melatonin 2-3 mg  You might need to redose this in the middle of the night  Diphenhydramine 25 mg is also a good over the counter sleep aid   Increase activity slowly   Complete by: As directed        Discharge Exam: Filed Weights   01/26/23 0342 01/27/23 0430 01/28/23 0520  Weight: 91.8 kg 87.9 kg 86.5 kg    General: Pt is alert, awake, not in acute distress Cardiovascular: RRR, nl S1-S2, no murmurs appreciated.   No LE edema.   Respiratory: Normal respiratory rate and rhythm.  CTAB without rales or wheezes. Abdominal: Abdomen soft and non-tender.  No distension or HSM.   Neuro/Psych: Strength symmetric in upper and lower extremities.  Judgment and insight appear normal.   Condition at discharge: good  The results of significant diagnostics from this hospitalization (including imaging, microbiology, ancillary and laboratory) are listed below for reference.   Imaging Studies: DG CHEST PORT 1 VIEW  Result Date: 01/27/2023 CLINICAL DATA:  Hypoxia, somnolent EXAM: PORTABLE CHEST 1 VIEW COMPARISON:  01/21/2023 FINDINGS: Single frontal view of the chest demonstrates an unremarkable cardiac silhouette. No acute airspace disease, effusion, or pneumothorax. No acute bony abnormality. IMPRESSION: 1. No acute intrathoracic process. Electronically Signed   By: Sharlet Salina M.D.   On: 01/27/2023 18:23   DG Abd Portable 1V  Result Date: 01/25/2023 CLINICAL DATA:  [id number] Encounter for feeding tube placement [id number] EXAM: PORTABLE ABDOMEN - 1 VIEW COMPARISON:  01/20/2023. FINDINGS: The bowel gas pattern is non-obstructive. No evidence of  pneumoperitoneum, within the limitations of a supine film. No acute osseous abnormalities. The soft tissues are within normal limits. Surgical changes, devices, tubes and lines: Provided history of interval placement of enteric tube. The tube courses below the left hemidiaphragm with its tip overlying the right upper quadrant, within the distal stomach. IMPRESSION: 1. Enteric tube tip within the distal stomach. Electronically Signed   By: Jules Schick M.D.   On: 01/25/2023 10:34   DG Chest Port 1 View  Result Date: 01/21/2023 CLINICAL DATA:  [id number] with ventilator dependent respiratory failure. EXAM: PORTABLE CHEST 1 VIEW COMPARISON:  Portable chest 01/19/2023 at 1:34 p.m. FINDINGS: 4:14 a.m. ETT tip is 4.6 cm from the carina. NGT is well into the stomach but neither the side-hole or tip are in the film. There is a tangle of overlying monitor wires. Again noted low lung volumes and  elevated right diaphragm with perihilar linear atelectasis. No focal pneumonia is evident. There is no substantial pleural effusion. There is mild cardiomegaly with stable mediastinum no vascular congestion findings. No new osseous abnormality.  Overall aeration seems unchanged. IMPRESSION: 1. Support apparatus stable. 2. Low lung volumes with perihilar atelectasis, unchanged. 3. Stable mild cardiomegaly without evidence of vascular congestion or edema. Electronically Signed   By: Almira Bar M.D.   On: 01/21/2023 07:24   DG Abd 1 View  Result Date: 01/20/2023 CLINICAL DATA:  Nasogastric tube placement EXAM: ABDOMEN - 1 VIEW COMPARISON:  01/16/2023 FINDINGS: A single nasogastric tube is present entering the stomach and terminating in the stomach antrum with side port in the stomach body. This is a similar position to the 01/16/2023 exam. The lung bases appear clear. Upper abdominal bowel gas pattern unremarkable. IMPRESSION: 1. Nasogastric tube terminates in the stomach antrum with side port in the stomach body. Electronically  Signed   By: Gaylyn Rong M.D.   On: 01/20/2023 20:03   DG Chest Port 1 View  Result Date: 01/19/2023 CLINICAL DATA:  Respiratory failure EXAM: PORTABLE CHEST 1 VIEW COMPARISON:  01/17/2023 FINDINGS: The patient is rotated to the right on today's radiograph, reducing diagnostic sensitivity and specificity. Endotracheal tube tip is 3.8 cm above the carina. Nasogastric tube terminates in the stomach antrum. Low lung volumes are present, causing crowding of the pulmonary vasculature. Mild scarring or atelectasis in the right mid lung. Prominent main pulmonary artery, raising the possibility of pulmonary arterial hypertension. Borderline elevation of the right hemidiaphragm. No blunting of the costophrenic angles. IMPRESSION: 1. Endotracheal tube tip 3.8 cm above the carina. Nasogastric tube terminates in the stomach antrum. 2. Low lung volumes with mild scarring or atelectasis in the right mid lung. 3. Prominent main pulmonary artery, raising the possibility of pulmonary arterial hypertension. Electronically Signed   By: Gaylyn Rong M.D.   On: 01/19/2023 14:36   Overnight EEG with video  Result Date: 01/19/2023 Charlsie Quest, MD     01/20/2023  5:59 AM Patient Name: Jermaine [name] MRN: [id number] Epilepsy Attending: Charlsie Quest Referring Physician/Provider: Caryl Pina, MD Duration: 01/18/2023 1644 to 01/19/2023 1644 Patient history: 57yo M with seizure-like episodes with posturing in a patient who initially presented on 8/19 with alcohol withdrawal symptoms. EEG to evaluate for seizure Level of alertness: comatose AEDs during EEG study: Phenobarb, LEV, propofol, Versed Technical aspects: This EEG study was done with scalp electrodes positioned according to the 10-20 International system of electrode placement. Electrical activity was reviewed with band pass filter of 1-70Hz , sensitivity of 7 uV/mm, display speed of 21mm/sec with a 60Hz  notched filter applied as appropriate. EEG data were  recorded continuously and digitally stored.  Video monitoring was available and reviewed as appropriate. Description: EEG showed continuous generalized 3 to 6 Hz theta-delta slowing admixed with an excessive amount of 15 to 18 Hz beta activity distributed symmetrically and diffusely. Hyperventilation and photic stimulation were not performed.   Event button was pressed on 01/19/2023 at 9826 and 1235 for agitation. Concomitant EEG before, during and after the event did not show any EEG changes suggest seizure.  IMPRESSION: This study was suggestive of severe diffuse encephalopathy, likely related to sedation. No seizures or epileptiform discharges were seen throughout the recording. Event button was pressed on 01/19/2023 at 9826 and 1235 for agitation without concomitant EEG change. These events were NOT epileptic.  Priyanka [name]   Rapid EEG  Result Date: 01/18/2023 Charlsie Quest,  MD     01/18/2023 11:24 AM Patient Name: Jermaine [name] MRN: [id number] Epilepsy Attending: Charlsie Quest Referring Physician/Provider: Caryl Pina, MD Duration: 44.58 mins  Patient history: 57yo Fpt having generalized tonic clonic movements with right sided decorticate posturing. EEG to evaluate for seizure.  Level of alertness: comatose  AEDs during EEG study: Phenobarb, propofol, versed  Technical aspects: This EEG was obtained using a 10 lead EEG system positioned circumferentially without any parasagittal coverage (rapid EEG). Computer selected EEG is reviewed as  well as background features and all clinically significant events.  Description: EEG showed continuous generalized 3 to 6 Hz theta-delta slowing admixed with an excessive amount of 15 to 18 Hz beta activity distributed symmetrically and diffusely. Hyperventilation and photic stimulation were not performed.    IMPRESSION: This limited ceribell EEG was suggestive of severe diffuse encephalopathy, likely related to sedation. No seizures or epileptiform discharges were  seen throughout the recording.  If suspicion for interictal activity remains a concern, a conventional EEG be considered.  Charlsie Quest   ECHOCARDIOGRAM COMPLETE  Result Date: 01/18/2023    ECHOCARDIOGRAM REPORT   Patient Name:   Jermaine [name] Date of Exam: 01/18/2023 Medical Rec #:  [id number]     Height:       70.0 in Accession #:    [id number]    Weight:       211.0 lb Date of Birth:  [birth year]     BSA:          2.135 m Patient Age:    57 years      BP:           125/84 mmHg Patient Gender: M             HR:           103 bpm. Exam Location:  Inpatient Procedure: 2D Echo, Color Doppler, Cardiac Doppler and Intracardiac            Opacification Agent Indications:    Hypotension  History:        Patient has no prior history of Echocardiogram examinations.                 Risk Factors:Obesity. ETOH, alcohol withdrawal.  Sonographer:    Milda Smart Referring Phys: [id number] PAULA B SIMPSON  Sonographer Comments: Echo performed with patient supine and on artificial respirator. Image acquisition challenging due to patient body habitus and Image acquisition challenging due to respiratory motion. IMPRESSIONS  1. Left ventricular ejection fraction, by estimation, is 65 to 70%. The left ventricle has normal function. The left ventricle has no regional wall motion abnormalities. Left ventricular diastolic parameters were normal.  2. Right ventricular systolic function is normal. The right ventricular size is normal.  3. The mitral valve is normal in structure. Trivial mitral valve regurgitation. No evidence of mitral stenosis.  4. The aortic valve is normal in structure. Aortic valve regurgitation is not visualized. No aortic stenosis is present.  5. The inferior vena cava is normal in size with greater than 50% respiratory variability, suggesting right atrial pressure of 3 mmHg.  6. Technically limited study due to poor sound wave transmission. FINDINGS  Left Ventricle: Left ventricular ejection fraction, by  estimation, is 65 to 70%. The left ventricle has normal function. The left ventricle has no regional wall motion abnormalities. Definity contrast agent was given IV to delineate the left ventricular  endocardial borders. The left ventricular internal cavity size was normal in size. There is  no left ventricular hypertrophy. Left ventricular diastolic parameters were normal. Right Ventricle: The right ventricular size is normal. No increase in right ventricular wall thickness. Right ventricular systolic function is normal. Left Atrium: Left atrial size was normal in size. Right Atrium: Right atrial size was normal in size. Pericardium: There is no evidence of pericardial effusion. Mitral Valve: The mitral valve is normal in structure. Trivial mitral valve regurgitation. No evidence of mitral valve stenosis. Tricuspid Valve: The tricuspid valve is normal in structure. Tricuspid valve regurgitation is trivial. No evidence of tricuspid stenosis. Aortic Valve: The aortic valve is normal in structure. Aortic valve regurgitation is not visualized. No aortic stenosis is present. Pulmonic Valve: The pulmonic valve was normal in structure. Pulmonic valve regurgitation is not visualized. No evidence of pulmonic stenosis. Aorta: The aortic root is normal in size and structure. Venous: The inferior vena cava is normal in size with greater than 50% respiratory variability, suggesting right atrial pressure of 3 mmHg. IAS/Shunts: No atrial level shunt detected by color flow Doppler.  LEFT VENTRICLE PLAX 2D LVIDd:         5.10 cm   Diastology LVIDs:         4.10 cm   LV e' medial:  8.05 cm/s LV PW:         1.00 cm   LV e' lateral: 11.30 cm/s LV IVS:        1.00 cm LVOT diam:     2.50 cm LV SV:         134 LV SV Index:   63 LVOT Area:     4.91 cm  RIGHT VENTRICLE RV S prime:     23.20 cm/s TAPSE (M-mode): 2.6 cm LEFT ATRIUM             Index        RIGHT ATRIUM           Index LA diam:        3.00 cm 1.40 cm/m   RA Area:     18.90  cm LA Vol (A2C):   52.6 ml 24.63 ml/m  RA Volume:   60.50 ml  28.33 ml/m LA Vol (A4C):   43.6 ml 20.42 ml/m LA Biplane Vol: 48.0 ml 22.48 ml/m  AORTIC VALVE LVOT Vmax:   154.00 cm/s LVOT Vmean:  116.000 cm/s LVOT VTI:    0.272 m  AORTA Ao Root diam: 3.70 cm Ao Asc diam:  3.70 cm  SHUNTS Systemic VTI:  0.27 m Systemic Diam: 2.50 cm Arvilla Meres MD Electronically signed by Arvilla Meres MD Signature Date/Time: 01/18/2023/9:48:44 AM    Final    Rapid EEG  Result Date: 01/17/2023 Charlsie Quest, MD     01/18/2023  9:59 AM Patient Name: Jermaine [name] MRN: [id number] Epilepsy Attending: Charlsie Quest Referring Physician/Provider: Luciano Cutter, MD Duration: 01/17/2023 1803 to 01/17/2023  2059 Patient history: 57yo Fpt having generalized tonic clonic movements with right sided decorticate posturing. EEG to evaluate for seizure. Level of alertness: Awake, asleep-->comatose AEDs during EEG study: Phenobarb, propofol, versed Technical aspects: This EEG was obtained using a 10 lead EEG system positioned circumferentially without any parasagittal coverage (rapid EEG). Computer selected EEG is reviewed as  well as background features and all clinically significant events. Description: EEG initially showed posterior dominant rhythm of 9 Hz activity of moderate voltage (25-35 uV) seen predominantly in posterior head regions, symmetric and reactive to eye opening and eye closing. Sleep was characterized by vertex waves, sleep  spindles (12 to 14 Hz), maximal frontocentral region. Gradually as sedation was adjusted, EEG showed continuous generalized 3 to 6 Hz theta-delta slowing admixed with an excessive amount of 15 to 18 Hz beta activity distributed symmetrically and diffusely. Hyperventilation and photic stimulation were not performed.   IMPRESSION: This limited ceribell EEG was initially within normal limits. Gradually as sedation was adjusted, EEG was suggestive of severe diffuse encephalopathy, likely  related to sedation. No seizures or epileptiform discharges were seen throughout the recording. If suspicion for interictal activity remains a concern, a conventional EEG be considered. Charlsie Quest   MR BRAIN W WO CONTRAST  Result Date: 01/17/2023 CLINICAL DATA:  Seizure, new-onset, no history of trauma unknown hx pta EXAM: MRI HEAD WITHOUT AND WITH CONTRAST TECHNIQUE: Multiplanar, multiecho pulse sequences of the brain and surrounding structures were obtained without and with intravenous contrast. CONTRAST:  9mL GADAVIST GADOBUTROL 1 MMOL/ML IV SOLN COMPARISON:  CT head from today. FINDINGS: Brain: No acute infarction, hemorrhage, hydrocephalus, extra-axial collection or mass lesion. Mild scattered small T2/FLAIR hyperintensities the white matter, nonspecific but considered within normal limits for patient age. Unremarkable hippocampi. No pathologic enhancement. Vascular: Major arterial flow voids are maintained skull base. Skull and upper cervical spine: Normal marrow signal. Sinuses/Orbits: Mild paranasal sinus mucosal thickening. No acute orbital findings. Other: Trace bilateral mastoid effusions. IMPRESSION: Unremarkable brain MRI.  No evidence of acute abnormality. Electronically Signed   By: Feliberto Harts M.D.   On: 01/17/2023 18:03   CT HEAD WO CONTRAST ( )  Result Date: 01/17/2023 CLINICAL DATA:  Seizure, new-onset, no history of trauma R sided decorticate posturing with seizure like activity; Neck trauma, intoxicated or obtunded (Age >= 16y) possible fall prior to arrival. EXAM: CT HEAD WITHOUT CONTRAST CT CERVICAL SPINE WITHOUT CONTRAST TECHNIQUE: Multidetector CT imaging of the head and cervical spine was performed following the standard protocol without intravenous contrast. Multiplanar CT image reconstructions of the cervical spine were also generated. RADIATION DOSE REDUCTION: This exam was performed according to the departmental dose-optimization program which includes automated  exposure control, adjustment of the mA and/or kV according to patient size and/or use of iterative reconstruction technique. COMPARISON:  None Available. FINDINGS: CT HEAD FINDINGS Brain: No acute intracranial hemorrhage. Gray-white differentiation is preserved. No hydrocephalus or extra-axial collection. No mass effect or midline shift. Vascular: No hyperdense vessel or unexpected calcification. Skull: No calvarial fracture or suspicious bone lesion. Skull base is unremarkable. Sinuses/Orbits: No acute finding. Other: None. CT CERVICAL SPINE FINDINGS Alignment: Normal. Skull base and vertebrae: No acute fracture. Normal craniocervical junction. No suspicious bone lesions. Soft tissues and spinal canal: No prevertebral fluid or swelling. No visible canal hematoma. Disc levels:  No significant degenerative change. Upper chest: No acute findings. Other: None. IMPRESSION: 1. No acute intracranial abnormality. 2. No acute cervical spine fracture or traumatic listhesis. Electronically Signed   By: Orvan Falconer M.D.   On: 01/17/2023 17:08   CT CERVICAL SPINE WO CONTRAST  Result Date: 01/17/2023 CLINICAL DATA:  Seizure, new-onset, no history of trauma R sided decorticate posturing with seizure like activity; Neck trauma, intoxicated or obtunded (Age >= 16y) possible fall prior to arrival. EXAM: CT HEAD WITHOUT CONTRAST CT CERVICAL SPINE WITHOUT CONTRAST TECHNIQUE: Multidetector CT imaging of the head and cervical spine was performed following the standard protocol without intravenous contrast. Multiplanar CT image reconstructions of the cervical spine were also generated. RADIATION DOSE REDUCTION: This exam was performed according to the departmental dose-optimization program which includes automated exposure control, adjustment  of the mA and/or kV according to patient size and/or use of iterative reconstruction technique. COMPARISON:  None Available. FINDINGS: CT HEAD FINDINGS Brain: No acute intracranial  hemorrhage. Gray-white differentiation is preserved. No hydrocephalus or extra-axial collection. No mass effect or midline shift. Vascular: No hyperdense vessel or unexpected calcification. Skull: No calvarial fracture or suspicious bone lesion. Skull base is unremarkable. Sinuses/Orbits: No acute finding. Other: None. CT CERVICAL SPINE FINDINGS Alignment: Normal. Skull base and vertebrae: No acute fracture. Normal craniocervical junction. No suspicious bone lesions. Soft tissues and spinal canal: No prevertebral fluid or swelling. No visible canal hematoma. Disc levels:  No significant degenerative change. Upper chest: No acute findings. Other: None. IMPRESSION: 1. No acute intracranial abnormality. 2. No acute cervical spine fracture or traumatic listhesis. Electronically Signed   By: Orvan Falconer M.D.   On: 01/17/2023 17:08   DG Chest Port 1 View  Result Date: 01/17/2023 CLINICAL DATA:  Respiratory failure EXAM: PORTABLE CHEST 1 VIEW COMPARISON:  CXR 01/16/23 FINDINGS: Endotracheal tube terminates below the thoracic inlet, likely in the midtrachea. Enteric tube courses below diaphragm with the tip out of the field of view. No pleural effusion. No pneumothorax. Low lung volumes. Compared to prior exam there appears to be increased mediastinal widening. This may be partially due to low lung volumes possibly venous congestion, but further evaluation with a chest CT is recommended for more definitive characterization. No radiographically apparent displaced rib fractures. Visualized upper abdomen is unremarkable. IMPRESSION: Compared to prior exam there appears to be increased mediastinal widening. This may partially the due to low lung volumes and venous congestion, but further evaluation with a chest CT is recommended for more definitive characterization. Electronically Signed   By: Lorenza Cambridge M.D.   On: 01/17/2023 09:24   [place] EKG SITE RITE  Result Date: 01/17/2023 If [facility] image not attached,  placement could not be confirmed due to current cardiac rhythm.  DG CHEST PORT 1 VIEW  Result Date: 01/16/2023 CLINICAL DATA:  Intubation. EXAM: PORTABLE CHEST 1 VIEW COMPARISON:  January 15, 2023. FINDINGS: Stable cardiomediastinal silhouette. Endotracheal tube is in grossly good position. Nasogastric tube is seen entering stomach. Left lung is clear. Minimal right basilar subsegmental atelectasis is noted. Bony thorax is unremarkable. IMPRESSION: Endotracheal and nasogastric tubes are in grossly good position. Minimal right basilar subsegmental atelectasis. Electronically Signed   By: Lupita Raider M.D.   On: 01/16/2023 10:11   DG Abd 1 View  Result Date: 01/16/2023 CLINICAL DATA:  Orogastric tube placement. EXAM: ABDOMEN - 1 VIEW COMPARISON:  None Available. FINDINGS: Distal tip of nasogastric tube is seen in expected position of distal stomach. IMPRESSION: Distal tip of nasogastric tube seen in expected position of distal stomach. Electronically Signed   By: Lupita Raider M.D.   On: 01/16/2023 10:10   DG CHEST PORT 1 VIEW  Result Date: 01/15/2023 CLINICAL DATA:  [id number] [id number] EXAM: PORTABLE CHEST 1 VIEW COMPARISON:  Chest x-ray 04/22/2022, CT chest 07/01/2021, CT chest angio 07/01/2021. FINDINGS: The heart and mediastinal contours are unchanged with prominent main pulmonary artery. Low lung volumes. No focal consolidation. No pulmonary edema. No pleural effusion. No pneumothorax. No acute osseous abnormality. IMPRESSION: 1. Low lung volumes with no active disease. 2. Persistent enlarged main pulmonary artery-correlate for pulmonary hypertension. Electronically Signed   By: Tish Frederickson M.D.   On: 01/15/2023 22:20   CT ABDOMEN PELVIS W CONTRAST  Result Date: 01/14/2023 CLINICAL DATA:  Abdominal pain, acute, nonlocalized EXAM: CT  ABDOMEN AND PELVIS WITH CONTRAST TECHNIQUE: Multidetector CT imaging of the abdomen and pelvis was performed using the standard protocol following bolus  administration of intravenous contrast. RADIATION DOSE REDUCTION: This exam was performed according to the departmental dose-optimization program which includes automated exposure control, adjustment of the mA and/or kV according to patient size and/or use of iterative reconstruction technique. CONTRAST:  OMNIPAQUE IOHEXOL 300 MG/ML  SOLN COMPARISON:  CT AP 04/22/22 FINDINGS: Lower chest: Right basilar atelectasis. Hepatobiliary: Liver has a normal contour. Portal and hepatic veins are contrast opacify. Interval increase in size of the hypodense lesion in the right hepatic lobe (series 2, image 27) compared to 04/22/2022. no evidence of perihepatic fluid. Gallbladder is distended with a small amount of layering hyperdense debris (series 2, image 35). There is no evidence of of pericholecystic fat stranding or definite evidence of wall thickening suggest acute cholecystitis. No evidence of intra or extrahepatic biliary ductal dilatation. Pancreas: There is no evidence of peripancreatic fat stranding to suggest pancreatitis. No evidence of pancreatic ductal dilatation. No definite evidence of focal pancreatic lesion. Spleen: Spleen is normal in size. No focal splenic lesions are visualized. Adrenals/Urinary Tract: Bilateral adrenal glands are normal in appearance. Bilateral kidneys are normal in size without evidence of hydronephrosis. There is likely a small 2 mm renal stone of the upper pole of the right kidney. No focal renal lesions are visualized. The urinary bladder is fluid-filled without focal abnormalities. Stomach/Bowel: No evidence of bowel obstruction. There is diverticulosis without diverticulitis. The appendix is normal in appearance. No evidence of focal wall thickening to suggest end-organ ischemia. No pneumatosis. No portal venous gas. Vascular/Lymphatic: There is an apparent filling defect in the proximal aspect of SMA (series 2, image 42). This is incompletely assessed due to the degree of  respiratory motion artifact. If there is clinical concern for acute mesenteric ischemia, with a CTA is recommended. No evidence of retroperitoneal, pelvic, or inguinal lymphadenopathy. Reproductive: Prostate is unremarkable. Other: No abdominal wall hernia or abnormality. No abdominopelvic ascites. Musculoskeletal: No acute or significant osseous findings. IMPRESSION: 1. Apparent filling defect in the proximal aspect of the SMA. This is incompletely assessed due to the degree of respiratory motion artifact and is likely artifactual. No evidence of end-organ ischemia. If there is clinical concern for acute mesenteric ischemia, further evaluation with a CTA abdomen/pelvis is recommended. 2. Interval increase in size of the hypodense lesion in the right hepatic lobe compared to 04/22/2022. Recommend further evaluation with a nonemergent outpatient liver protocol MRI. 3. Distended gallbladder with a small amount of layering hyperdense debris. No evidence of pericholecystic fat stranding or definite evidence of wall thickening to suggest acute cholecystitis. Electronically Signed   By: Lorenza Cambridge M.D.   On: 01/14/2023 08:17    Microbiology: Results for orders placed or performed during the hospital encounter of 01/14/23  MRSA Next Gen by PCR, Nasal     Status: None   Collection Time: 01/15/23 10:27 AM   Specimen: Nasal Mucosa; Nasal Swab  Result Value Ref Range Status   MRSA by PCR Next Gen NOT DETECTED NOT DETECTED Final    Comment: (NOTE) The GeneXpert MRSA Assay (FDA approved for NASAL specimens only), is one component of a comprehensive MRSA colonization surveillance program. It is not intended to diagnose MRSA infection nor to guide or monitor treatment for MRSA infections. Test performance is not FDA approved in patients less than 13 years old. Performed at [facility], [address] [place], [place]  [address]   SARS Coronavirus 2 by RT PCR (hospital order, performed in  [facility] hospital lab) *cepheid single result test* Anterior Nasal Swab     Status: None   Collection Time: 01/15/23  6:02 PM   Specimen: Anterior Nasal Swab  Result Value Ref Range Status   SARS Coronavirus 2 by RT PCR NEGATIVE NEGATIVE Final    Comment: (NOTE) SARS-CoV-2 target nucleic acids are NOT DETECTED.  The SARS-CoV-2 RNA is generally detectable in upper and lower respiratory specimens during the acute phase of infection. The lowest concentration of SARS-CoV-2 viral copies this assay can detect is 250 copies / mL. A negative result does not preclude SARS-CoV-2 infection and should not be used as the sole basis for treatment or other patient management decisions.  A negative result may occur with improper specimen collection / handling, submission of specimen other than nasopharyngeal swab, presence of viral mutation(s) within the areas targeted by this assay, and inadequate number of viral copies (<250 copies / mL). A negative result must be combined with clinical observations, patient history, and epidemiological information.  Fact Sheet for Patients:   RoadLapTop.co.za  Fact Sheet for Healthcare Providers: http://kim-miller.com/  This test is not yet approved or  cleared by the [place] FDA and has been authorized for detection and/or diagnosis of SARS-CoV-2 by FDA under an Emergency Use Authorization (EUA).  This EUA will remain in effect (meaning this test can be used) for the duration of the COVID-19 declaration under Section 564(b)(1) of the Act, 21 U.S.C. section 360bbb-3(b)(1), unless the authorization is terminated or revoked sooner.  Performed at [facility] [address] [place], [place] [address]   Culture, Respiratory w Gram Stain     Status: None   Collection Time: 01/16/23  9:12 AM   Specimen: Tracheal Aspirate; Respiratory  Result Value Ref Range Status   Specimen Description    Final    TRACHEAL ASPIRATE Performed at [facility], [address] [place], [place] [address]    Special Requests   Final    NONE Performed at [facility], [address] [place], [place] [address]    Gram Stain   Final    FEW WBC PRESENT, PREDOMINANTLY PMN RARE GRAM POSITIVE RODS    Culture   Final    RARE Normal respiratory flora-no Staph aureus or Pseudomonas seen Performed at [facility], [address], [place], [place] [address]    Report Status 01/18/2023 FINAL  Final  Culture, blood (Routine X 2) w Reflex to ID Panel     Status: None   Collection Time: 01/17/23 10:23 AM   Specimen: BLOOD RIGHT ARM  Result Value Ref Range Status   Specimen Description   Final    BLOOD RIGHT ARM AEROBIC BOTTLE ONLY ANAEROBIC BOTTLE ONLY Performed at [facility], [address], [place], [place] [address]    Special Requests   Final    BOTTLES DRAWN AEROBIC AND ANAEROBIC Blood Culture adequate volume Performed at [facility], [address] [place], [place] [address]    Culture   Final    NO GROWTH 6 DAYS Performed at [facility], [address], [place], [place] [address]    Report Status 01/23/2023 FINAL  Final  Culture, blood (Routine X 2) w Reflex to ID Panel     Status: None   Collection Time: 01/17/23 10:23 AM   Specimen: BLOOD RIGHT HAND  Result Value Ref Range Status   Specimen Description   Final    BLOOD RIGHT HAND AEROBIC BOTTLE ONLY ANAEROBIC BOTTLE ONLY Performed at [facility], [address] [place], [place] [address]    Special Requests   Final    BOTTLES DRAWN AEROBIC AND ANAEROBIC Blood Culture adequate volume Performed at [facility], [address] [place], [place] [address]    Culture   Final    NO GROWTH 6 DAYS Performed at [facility], [address], [place], [place] [address]    Report Status 01/23/2023 FINAL   Final  Urine Culture (for pregnant, neutropenic or urologic patients or patients with an indwelling urinary catheter)     Status: None   Collection Time: 01/17/23 10:35 AM   Specimen: Urine, Catheterized  Result Value Ref Range Status   Specimen Description   Final    URINE, CATHETERIZED Performed at [facility], [address] [place], [place] [address]    Special Requests   Final    NONE Performed at [facility], [address] [place], [place] [address]    Culture   Final    NO GROWTH Performed at [facility], [address], [place], [place] [address]    Report Status 01/19/2023 FINAL  Final  Expectorated Sputum Assessment w Gram Stain, Rflx to Resp Cult     Status: None   Collection Time: 01/21/23 10:28 AM   Specimen: Sputum  Result Value Ref Range Status   Specimen Description SPUTUM  Final   Special Requests NONE  Final   Sputum evaluation   Final    THIS SPECIMEN IS ACCEPTABLE FOR SPUTUM CULTURE Performed at [facility], [address], [place], [place] [address]    Report Status 01/21/2023 FINAL  Final  Culture, Respiratory w Gram Stain     Status: None   Collection Time: 01/21/23 10:28 AM   Specimen: SPU  Result Value Ref Range Status   Specimen Description SPUTUM  Final   Special Requests NONE Reflexed from [id number]  Final   Gram Stain   Final    RARE SQUAMOUS EPITHELIAL CELLS PRESENT RARE WBC PRESENT,BOTH PMN AND MONONUCLEAR FEW GRAM POSITIVE COCCI IN PAIRS RARE GRAM NEGATIVE RODS Performed at [facility], [address], [place], [place] [address]    Culture MODERATE KLEBSIELLA PNEUMONIAE  Final   Report Status 01/23/2023 FINAL  Final   Organism ID, Bacteria KLEBSIELLA PNEUMONIAE  Final      Susceptibility   Klebsiella pneumoniae - MIC*    AMPICILLIN RESISTANT Resistant     CEFEPIME <=0.12 SENSITIVE Sensitive     CEFTAZIDIME <=1 SENSITIVE Sensitive     CEFTRIAXONE <=0.25 SENSITIVE Sensitive      CIPROFLOXACIN <=0.25 SENSITIVE Sensitive     GENTAMICIN <=1 SENSITIVE Sensitive     IMIPENEM <=0.25 SENSITIVE Sensitive     TRIMETH/SULFA <=20 SENSITIVE Sensitive     AMPICILLIN/SULBACTAM 4 SENSITIVE Sensitive     PIP/TAZO <=4 SENSITIVE Sensitive     * MODERATE KLEBSIELLA PNEUMONIAE  MRSA Next Gen by PCR, Nasal     Status: None   Collection Time: 01/21/23  7:20 PM   Specimen: Nasal Mucosa; Nasal Swab  Result Value Ref Range Status   MRSA by PCR Next Gen NOT DETECTED NOT DETECTED Final    Comment: (NOTE) The GeneXpert MRSA Assay (FDA approved for NASAL specimens only), is one component of a comprehensive  MRSA colonization surveillance program. It is not intended to diagnose MRSA infection nor to guide or monitor treatment for MRSA infections. Test performance is not FDA approved in patients less than 60 years old. Performed at [facility], [address] [place], [place] [address]     Labs: CBC: Recent Labs  Lab 01/22/23 [phone] 01/23/23 0916 01/24/23 0845 01/25/23 0236 01/26/23 0221 01/28/23 0208  WBC 11.1* 8.1 15.4* 9.1 7.4 10.3  NEUTROABS 7.1 3.8  --   --   --   --   HGB 15.5 14.1 15.7 13.1 13.8 15.0  HCT 46.7 40.9 47.9 40.0 40.9 43.8  MCV 96.7 95.3 98.6 95.7 94.9 96.3  PLT 245 302 294 351 415* 541*   Basic Metabolic Panel: Recent Labs  Lab 01/23/23 0916 01/24/23 0845 01/25/23 0236 01/26/23 0221 01/28/23 0208  NA 135 137 136 137 138  K 3.7 4.8 4.0 3.8 3.6  CL 102 103 102 103 105  CO2 24 21* 24 24 26   GLUCOSE 105* 92 89 116* 103*  BUN 26* 27* 22* 20 16  CREATININE 1.06 1.11 0.94 0.94 0.87  CALCIUM 8.6* 8.8* 8.8* 8.9 8.9  MG 2.4 2.4  --  2.1  --   PHOS 2.9 4.6  --  2.2*  --    Liver Function Tests: Recent Labs  Lab 01/22/23 0455 01/23/23 0916 01/28/23 0208  AST 54* 22 60*  ALT 50* 31 93*  ALKPHOS 53 44 57  BILITOT 0.6 0.6 0.5  PROT 6.9 6.0* 7.1  ALBUMIN 3.2* 2.8* 3.3*   CBG: Recent Labs  Lab 01/22/23 1942 01/22/23 2322 01/23/23 0343  01/23/23 0721 01/23/23 1131  GLUCAP 96 107* 100* 101* 123*    Discharge time spent: approximately 35 minutes spent on discharge counseling, evaluation of patient on day of discharge, and coordination of discharge planning with nursing, social work, pharmacy and case management  Signed: Alberteen Sam, MD [facility] 01/28/2023

## 2023-01-28 NOTE — Progress Notes (Addendum)
Patient is alert and oriented x2. Disoriented to time and situation. He is fixated on going home today. Shows improvement from the behavior he displayed on the previous night. Patient ambulated several times to restroom and had bowel movement/urination. Did not sleep any during the night due to sleeping the entire day on yesterday. Was up in chair at shift start and tearful when speaking about his alcoholism. Still gets confused and forgetful periodically regarding hospital stay and plan of care. Stated that alcohol made him feel better. Denied pain. CIWA scores last night were 4, 6, 1 and 0. Received prn ativan x1. Was on 2L [place]; now back on room air. Denied additional needs.

## 2023-01-28 NOTE — Progress Notes (Signed)
Nurse requested Mobility Specialist to perform oxygen saturation test with pt which includes removing pt from oxygen both at rest and while ambulating.  Below are the results from that testing.     Patient Saturations on Room Air at Rest = spO2 99%  Patient Saturations on Room Air while Ambulating = sp02 91% .  Rested and performed pursed lip breathing for 1 minute with sp02 at 92%.   Reported results to nurse.

## 2023-01-28 NOTE — Progress Notes (Signed)
Mobility Specialist Progress Note:   01/28/23 1000  Mobility  Activity Ambulated with assistance in hallway  Level of Assistance Standby assist, set-up cues, supervision of patient - no hands on  Assistive Device Other (Comment) (IV Pole)  Distance Ambulated (ft) 340 ft  Activity Response Tolerated well  Mobility Referral Yes  $Mobility charge 1 Mobility  Mobility Specialist Start Time (ACUTE ONLY) 0914  Mobility Specialist Stop Time (ACUTE ONLY) 0930  Mobility Specialist Time Calculation (min) (ACUTE ONLY) 16 min    Pre Mobility: 106 HR,  99% SpO2 During Mobility: 113 HR,  91% SpO2 Post Mobility:  103 HR,  92% SpO2  Pt received in chair, agreeable to mobility. Asymptomatic throughout w/ no complaints. Pt left in chair with call bell and all needs met.  Jermaine [name] Mobility Specialist Please contact via Special educational needs teacher or Rehab office at [phone]

## 2023-01-28 NOTE — Progress Notes (Signed)
Level of care changed to med -surg. Cardiac monitoring d/ced.

## 2023-01-28 NOTE — TOC Transition Note (Signed)
Transition of Care [facility]) - CM/SW Discharge Note   Patient Details  Name: Jermaine [name] MRN: [id number] Date of Birth: 1965-10-21  [facility]) CM/SW Contact:  Leone Haven, RN Phone Number: 01/28/2023, 2:20 PM   Clinical Narrative:    Patient is for dc today, NCM gave him SA resources. He has transportation.     Barriers to Discharge: Continued Medical Work up   Patient Goals and CMS Choice      Discharge Placement                         Discharge Plan and Services Additional resources added to the After Visit Summary for                                       Social Determinants of Health (SDOH) Interventions SDOH Screenings   Food Insecurity: No Food Insecurity (01/14/2023)  Housing: Medium Risk (01/14/2023)  Transportation Needs: No Transportation Needs (01/14/2023)  Utilities: Not At Risk (01/14/2023)  Tobacco Use: Low Risk  (01/14/2023)     Readmission Risk Interventions    01/17/2023    4:03 PM 01/14/2023    1:49 PM  Readmission Risk Prevention Plan  Post Dischage Appt  Complete  Medication Screening  Complete  Transportation Screening Complete Complete  PCP or Specialist Appt within 5-7 Days Complete   Home Care Screening Complete   Medication Review (RN CM) Complete

## 2023-05-01 ENCOUNTER — Emergency Department (HOSPITAL_COMMUNITY)
Admission: EM | Admit: 2023-05-01 | Discharge: 2023-05-01 | Discharge status: Against Medical Advice | Payer: BLUE CROSS/BLUE SHIELD | Source: Home / Self Care

## 2023-05-01 ENCOUNTER — Encounter (HOSPITAL_COMMUNITY): Payer: Self-pay | Admitting: *Deleted

## 2023-05-01 ENCOUNTER — Inpatient Hospital Stay (HOSPITAL_COMMUNITY)
Admission: EM | Admit: 2023-05-01 | Discharge: 2023-05-03 | DRG: 894 | Discharge status: Against Medical Advice | Payer: BLUE CROSS/BLUE SHIELD | Attending: Internal Medicine | Admitting: Internal Medicine

## 2023-05-01 ENCOUNTER — Other Ambulatory Visit: Payer: Self-pay

## 2023-05-01 ENCOUNTER — Emergency Department (HOSPITAL_COMMUNITY): Payer: BLUE CROSS/BLUE SHIELD

## 2023-05-01 DIAGNOSIS — F10939 Alcohol use, unspecified with withdrawal, unspecified: Secondary | ICD-10-CM | POA: Diagnosis present

## 2023-05-01 DIAGNOSIS — Z6828 Body mass index (BMI) 28.0-28.9, adult: Secondary | ICD-10-CM

## 2023-05-01 DIAGNOSIS — R000 Tachycardia, unspecified: Secondary | ICD-10-CM | POA: Diagnosis present

## 2023-05-01 DIAGNOSIS — Z79899 Other long term (current) drug therapy: Secondary | ICD-10-CM

## 2023-05-01 DIAGNOSIS — F10929 Alcohol use, unspecified with intoxication, unspecified: Principal | ICD-10-CM

## 2023-05-01 DIAGNOSIS — F32A Depression, unspecified: Secondary | ICD-10-CM | POA: Diagnosis present

## 2023-05-01 DIAGNOSIS — S0231XA Fracture of orbital floor, right side, initial encounter for closed fracture: Secondary | ICD-10-CM

## 2023-05-01 DIAGNOSIS — F10229 Alcohol dependence with intoxication, unspecified: Secondary | ICD-10-CM | POA: Diagnosis present

## 2023-05-01 DIAGNOSIS — R0902 Hypoxemia: Secondary | ICD-10-CM | POA: Diagnosis present

## 2023-05-01 DIAGNOSIS — F1010 Alcohol abuse, uncomplicated: Secondary | ICD-10-CM | POA: Diagnosis present

## 2023-05-01 DIAGNOSIS — Z5321 Procedure and treatment not carried out due to patient leaving prior to being seen by health care provider: Secondary | ICD-10-CM | POA: Insufficient documentation

## 2023-05-01 DIAGNOSIS — S0230XA Fracture of orbital floor, unspecified side, initial encounter for closed fracture: Secondary | ICD-10-CM | POA: Insufficient documentation

## 2023-05-01 DIAGNOSIS — Z888 Allergy status to other drugs, medicaments and biological substances status: Secondary | ICD-10-CM

## 2023-05-01 DIAGNOSIS — F10239 Alcohol dependence with withdrawal, unspecified: Principal | ICD-10-CM | POA: Diagnosis present

## 2023-05-01 DIAGNOSIS — E66811 Obesity, class 1: Secondary | ICD-10-CM | POA: Diagnosis present

## 2023-05-01 DIAGNOSIS — R296 Repeated falls: Secondary | ICD-10-CM | POA: Diagnosis present

## 2023-05-01 DIAGNOSIS — S022XXA Fracture of nasal bones, initial encounter for closed fracture: Secondary | ICD-10-CM

## 2023-05-01 DIAGNOSIS — F419 Anxiety disorder, unspecified: Secondary | ICD-10-CM | POA: Diagnosis present

## 2023-05-01 DIAGNOSIS — S0292XA Unspecified fracture of facial bones, initial encounter for closed fracture: Secondary | ICD-10-CM | POA: Insufficient documentation

## 2023-05-01 DIAGNOSIS — F10129 Alcohol abuse with intoxication, unspecified: Secondary | ICD-10-CM | POA: Diagnosis present

## 2023-05-01 DIAGNOSIS — Z9181 History of falling: Secondary | ICD-10-CM

## 2023-05-01 DIAGNOSIS — R451 Restlessness and agitation: Secondary | ICD-10-CM

## 2023-05-01 DIAGNOSIS — Y908 Blood alcohol level of 240 mg/100 ml or more: Secondary | ICD-10-CM | POA: Diagnosis present

## 2023-05-01 LAB — CBC WITH DIFFERENTIAL/PLATELET
Abs Immature Granulocytes: 0.05 10*3/uL (ref 0.00–0.07)
Basophils Absolute: 0.1 10*3/uL (ref 0.0–0.1)
Basophils Relative: 1 %
Eosinophils Absolute: 0 10*3/uL (ref 0.0–0.5)
Eosinophils Relative: 0 %
HCT: 48 % (ref 39.0–52.0)
Hemoglobin: 16.4 g/dL (ref 13.0–17.0)
Immature Granulocytes: 1 %
Lymphocytes Relative: 26 %
Lymphs Abs: 2.6 10*3/uL (ref 0.7–4.0)
MCH: 32.3 pg (ref 26.0–34.0)
MCHC: 34.2 g/dL (ref 30.0–36.0)
MCV: 94.5 fL (ref 80.0–100.0)
Monocytes Absolute: 1 10*3/uL (ref 0.1–1.0)
Monocytes Relative: 10 %
Neutro Abs: 6.3 10*3/uL (ref 1.7–7.7)
Neutrophils Relative %: 62 %
Platelets: 248 10*3/uL (ref 150–400)
RBC: 5.08 MIL/uL (ref 4.22–5.81)
RDW: 12.9 % (ref 11.5–15.5)
WBC: 9.9 10*3/uL (ref 4.0–10.5)
nRBC: 0 % (ref 0.0–0.2)

## 2023-05-01 LAB — COMPREHENSIVE METABOLIC PANEL
ALT: 40 U/L (ref 0–44)
AST: 46 U/L — ABNORMAL HIGH (ref 15–41)
Albumin: 4 g/dL (ref 3.5–5.0)
Alkaline Phosphatase: 66 U/L (ref 38–126)
Anion gap: 12 (ref 5–15)
BUN: 18 mg/dL (ref 6–20)
CO2: 26 mmol/L (ref 22–32)
Calcium: 8.3 mg/dL — ABNORMAL LOW (ref 8.9–10.3)
Chloride: 103 mmol/L (ref 98–111)
Creatinine, Ser: 1.13 mg/dL (ref 0.61–1.24)
GFR, Estimated: >60 mL/min (ref >60)
Glucose, Bld: 128 mg/dL — ABNORMAL HIGH (ref 70–99)
Potassium: 3.9 mmol/L (ref 3.5–5.1)
Sodium: 141 mmol/L (ref 135–145)
Total Bilirubin: 0.6 mg/dL (ref <1.2)
Total Protein: 6.4 g/dL — ABNORMAL LOW (ref 6.5–8.1)

## 2023-05-01 LAB — SALICYLATE LEVEL: Salicylate Lvl: <7 mg/dL — ABNORMAL LOW (ref 7.0–30.0)

## 2023-05-01 LAB — ETHANOL: Alcohol, Ethyl (B): 368 mg/dL (ref <10)

## 2023-05-01 LAB — ACETAMINOPHEN LEVEL: Acetaminophen (Tylenol), Serum: <10 ug/mL — ABNORMAL LOW (ref 10–30)

## 2023-05-01 MED ORDER — SODIUM CHLORIDE 0.9 % IV BOLUS
1000.0000 mL | Freq: Once | INTRAVENOUS | Status: AC
  Administered 2023-05-01: 1000 mL via INTRAVENOUS

## 2023-05-01 MED ORDER — LORAZEPAM 2 MG/ML IJ SOLN
1.0000 mg | INTRAMUSCULAR | Status: DC | PRN
  Administered 2023-05-01 – 2023-05-02 (×2): 2 mg via INTRAVENOUS
  Administered 2023-05-02: 4 mg via INTRAVENOUS
  Administered 2023-05-02 (×2): 2 mg via INTRAVENOUS
  Administered 2023-05-02: 4 mg via INTRAVENOUS
  Filled 2023-05-01 (×3): qty 1
  Filled 2023-05-01: qty 2
  Filled 2023-05-01: qty 1
  Filled 2023-05-01: qty 2

## 2023-05-01 MED ORDER — THIAMINE HCL 100 MG/ML IJ SOLN: 100.0000 mg | Freq: Every day | INTRAMUSCULAR | Status: DC

## 2023-05-01 MED ORDER — ADULT MULTIVITAMIN W/MINERALS CH: 1.0000 | ORAL_TABLET | Freq: Every day | ORAL | Status: DC

## 2023-05-01 MED ORDER — FOLIC ACID 1 MG PO TABS
1.0000 mg | ORAL_TABLET | Freq: Every day | ORAL | Status: DC
Start: 2023-05-02 — End: 2023-05-02

## 2023-05-01 MED ORDER — THIAMINE MONONITRATE 100 MG PO TABS: 100.0000 mg | ORAL_TABLET | Freq: Every day | ORAL | Status: DC

## 2023-05-01 MED ORDER — LORAZEPAM 2 MG/ML IJ SOLN
2.0000 mg | Freq: Once | INTRAMUSCULAR | Status: AC
  Administered 2023-05-01: 2 mg via INTRAVENOUS
  Filled 2023-05-01: qty 1

## 2023-05-01 MED ORDER — LORAZEPAM 1 MG PO TABS
1.0000 mg | ORAL_TABLET | ORAL | Status: DC | PRN
  Administered 2023-05-02: 2 mg via ORAL
  Administered 2023-05-02: 3 mg via ORAL
  Administered 2023-05-02: 1 mg via ORAL
  Administered 2023-05-03: 2 mg via ORAL
  Filled 2023-05-01: qty 3
  Filled 2023-05-01 (×2): qty 2
  Filled 2023-05-01: qty 1

## 2023-05-01 NOTE — ED Notes (Signed)
Patient transported to CT 

## 2023-05-01 NOTE — ED Provider Notes (Incomplete)
  Physical Exam  BP (!) 103/59   Pulse (!) 110   Temp 98.4 F (36.9 C)   Resp 16   SpO2 97%   Physical Exam  Procedures  Procedures  ED Course / MDM    Medical Decision Making Amount and/or Complexity of Data Reviewed Radiology: ordered.  Risk OTC drugs. Prescription drug management.   ***

## 2023-05-01 NOTE — ED Provider Notes (Signed)
  Physical Exam  BP (!) 103/59   Pulse (!) 110   Temp 98.4 F (36.9 C)   Resp 16   SpO2 97%   Physical Exam Vitals and nursing note reviewed.  Constitutional:      General: He is not in acute distress.    Appearance: He is well-developed.  Eyes:     Extraocular Movements: Extraocular movements intact.     Conjunctiva/sclera: Conjunctivae normal.     Pupils: Pupils are equal, round, and reactive to light.  Cardiovascular:     Rate and Rhythm: Normal rate and regular rhythm.     Heart sounds: No murmur heard. Pulmonary:     Effort: Pulmonary effort is normal. No respiratory distress.     Breath sounds: Normal breath sounds.  Abdominal:     Palpations: Abdomen is soft.     Tenderness: There is no abdominal tenderness.  Musculoskeletal:        General: No swelling. Normal range of motion.     Cervical back: Neck supple.  Skin:    General: Skin is warm and dry.     Capillary Refill: Capillary refill takes less than 2 seconds.  Neurological:     General: No focal deficit present.     Mental Status: He is alert and oriented to person, place, and time.  Psychiatric:        Mood and Affect: Mood normal.     Procedures  Procedures  ED Course / MDM    Medical Decision Making Amount and/or Complexity of Data Reviewed Labs: ordered. Radiology: ordered.  Risk OTC drugs. Prescription drug management. Decision regarding hospitalization.  Taken from [employer].  Please see his note for full details.  Patient has history of alcohol abuse.  Presented today stating he was trying to stop drinking.  Was intoxicated here with alcohol of 368.  Was calm and cooperative initially but became very agitated with staff and did ultimately get 2 mg of Ativan and IV fluids because he was tachycardic.  Patient has subacute fractures of his facial bones and will be outpatient ENT follow-up no acute extraocular muscle entrapment.  Plan is to allow patient to metabolize, if he has  persistent vital sign abnormalities, has any significant withdrawal symptoms or was wanting to stay for withdrawal given his significant history of withdrawal requiring intubation for complicated withdrawal we will consult hospitalist at that time.  Reassessment at 1:30 AM revealed normal blood pressure 120/60.  Patient very somnolent at this time, heart rate is 1 to 2 bpm on the monitor.  Patient is not diaphoretic or tremulous.  He awakens to touch and will say his name but then goes back to sleep.  Will continue to monitor.  Reevaluation at 3 AM, patient is more arousable, heart rate is 102, states he wants to be admitted for detox especially worried about severe withdrawal given prior intubation.  I reviewed prior records, he did have a normal EEG and MRI of his brain while intubated in the past for increasing confusion related to his alcohol withdrawal   Discussed with hospitalist for admission.       Ma Rings, PA-C 05/02/23 0401    Terald Sleeper, MD 05/02/23 [phone]

## 2023-05-01 NOTE — ED Provider Triage Note (Signed)
Emergency Medicine Provider Triage Evaluation Note  Dashaun Daskal , a 57 y.o. male  was evaluated in triage.  Pt complains of alcohol intoxication.  States he relapsed over Thanksgiving.  He was sober for 3 months prior.  He wants to stop.  Has a significant history including alcohol withdrawal with coma.  He is asymptomatic right now.  Last drink just prior to arrival.  Denies SI, HI, AVH.  Review of Systems  Positive: As above Negative: As above  Physical Exam  BP (!) 148/107 (BP Location: Left Arm)   Pulse (!) 121   Temp 98.5 F (36.9 C)   Resp 18   SpO2 94%  Gen:   Awake, no distress   Resp:  Normal effort  MSK:   Moves extremities without difficulty  Other:    Medical Decision Making  Medically screening exam initiated at 7:57 PM.  Appropriate orders placed.  Jancarlos Sorrento was informed that the remainder of the evaluation will be completed by another provider, this initial triage assessment does not replace that evaluation, and the importance of remaining in the ED until their evaluation is complete.  Workup initiated   Mora Bellman 05/01/23 1958

## 2023-05-01 NOTE — ED Notes (Signed)
Pt. Out of bed very agitated and anxious due to withdrawal. Verbal de escalation efforts made by staff for pt. To get back in bed and let staff care for him. PA Geiple made aware. New orders placed.

## 2023-05-01 NOTE — ED Provider Notes (Signed)
[place] EMERGENCY DEPARTMENT AT [facility] Provider Note   CSN: [id number] Arrival date & time: 05/01/23  1932     History  Chief Complaint  Patient presents with   Alcohol Problem    Jermaine [name] is a 57 y.o. male.  Patient with history of alcoholism, severe withdrawals requiring hospitalization --presents to the emergency department over concerns of withdrawals.  Patient had a fall about 4 days ago.  He was seen at an urgent care and had Steri-Strips applied to his nose yesterday.  Patient is reporting nausea, vomiting, diarrhea.  He states that he is trying to stop drinking and knows that when he drinks this much, he will need to come into the hospital due to severe withdrawal symptoms.  Denies severe headache or vision change.  No weakness, numbness, or tingling in the arms of the legs.  No chest pain.       Home Medications Prior to Admission medications   Medication Sig Start Date End Date Taking? Authorizing Provider  escitalopram (LEXAPRO) 20 MG tablet Take 20 mg by mouth daily. 02/27/19   [provider]  folic acid (FOLVITE) 1 MG tablet Take 1 tablet (1 mg total) by mouth daily. 01/28/23   Danford, Earl Lites, MD  gabapentin (NEURONTIN) 400 MG capsule Take 1 capsule (400 mg total) by mouth 3 (three) times daily. 01/28/23   Danford, Earl Lites, MD  hydrOXYzine (ATARAX) 25 MG tablet Take 1 tablet (25 mg total) by mouth 3 (three) times daily as needed for itching or anxiety. 01/28/23   Danford, Earl Lites, MD  Multiple Vitamin (MULTIVITAMIN WITH MINERALS) TABS tablet Take 1 tablet by mouth daily. 05/20/22   [name], MD  naltrexone (DEPADE) 50 MG tablet Take 1 tablet (50 mg total) by mouth daily. 01/28/23   Danford, Earl Lites, MD  thiamine (VITAMIN B-1) 100 MG tablet Take 1 tablet (100 mg total) by mouth daily. 05/20/22   [name], MD      Allergies    Ketamine    Review of Systems   Review of Systems  Physical Exam Updated Vital  Signs BP (!) 138/110 (BP Location: Right Arm)   Pulse (!) 124   Temp 98.4 F (36.9 C)   Resp 16   SpO2 96%  Physical Exam Vitals and nursing note reviewed.  Constitutional:      General: He is not in acute distress.    Appearance: He is well-developed.  HENT:     Head: Normocephalic.     Comments: Patient with dried blood along the linear laceration on the bridge of the nose.  Patient has several Steri-Strips that are in place.  No active bleeding.    Right Ear: External ear normal.     Left Ear: External ear normal.     Mouth/Throat:     Mouth: Mucous membranes are dry.  Eyes:     General:        Right eye: No discharge.        Left eye: No discharge.     Extraocular Movements: Extraocular movements intact.     Conjunctiva/sclera: Conjunctivae normal.     Comments: Globes appear normal, EOMI. no sign of entrapment today.  Cardiovascular:     Rate and Rhythm: Regular rhythm. Tachycardia present.     Heart sounds: Normal heart sounds.  Pulmonary:     Effort: Pulmonary effort is normal.     Breath sounds: Normal breath sounds.  Abdominal:     Palpations: Abdomen  is soft.     Tenderness: There is no abdominal tenderness. There is no guarding or rebound.  Musculoskeletal:     Cervical back: Normal range of motion and neck supple.  Skin:    General: Skin is warm and dry.  Neurological:     General: No focal deficit present.     Mental Status: He is alert. Mental status is at baseline.  Psychiatric:        Mood and Affect: Affect is labile.     Comments: Pt very labile. Was calm and cooperative with me. Then became very agitated with staff.      ED Results / Procedures / Treatments   Labs (all labs ordered are listed, but only abnormal results are displayed) Labs Reviewed  COMPREHENSIVE METABOLIC PANEL - Abnormal; Notable for the following components:      Result Value   Glucose, Bld 128 (*)    Calcium 8.3 (*)    Total Protein 6.4 (*)    AST 46 (*)    All other  components within normal limits  ETHANOL - Abnormal; Notable for the following components:   Alcohol, Ethyl (B) 368 (*)    All other components within normal limits  SALICYLATE LEVEL - Abnormal; Notable for the following components:   Salicylate Lvl <7.0 (*)    All other components within normal limits  ACETAMINOPHEN LEVEL - Abnormal; Notable for the following components:   Acetaminophen (Tylenol), Serum <10 (*)    All other components within normal limits  CBC WITH DIFFERENTIAL/PLATELET  RAPID URINE DRUG SCREEN, HOSP PERFORMED    EKG None  Radiology CT Head Wo Contrast  Result Date: 05/01/2023 CLINICAL DATA:  Recent fall EXAM: CT HEAD WITHOUT CONTRAST CT MAXILLOFACIAL WITHOUT CONTRAST TECHNIQUE: Multidetector CT imaging of the head and maxillofacial structures were performed using the standard protocol without intravenous contrast. Multiplanar CT image reconstructions of the maxillofacial structures were also generated. RADIATION DOSE REDUCTION: This exam was performed according to the departmental dose-optimization program which includes automated exposure control, adjustment of the mA and/or kV according to patient size and/or use of iterative reconstruction technique. COMPARISON:  None Available. FINDINGS: CT HEAD FINDINGS Brain: There is no mass, hemorrhage or extra-axial collection. The size and configuration of the ventricles and extra-axial CSF spaces are normal. The brain parenchyma is normal, without evidence of acute or chronic infarction. Vascular: No hyperdense vessel or unexpected vascular calcification. Skull: The visualized skull base, calvarium and extracranial soft tissues are normal. CT MAXILLOFACIAL FINDINGS Osseous: Minimally displaced nasal bone fractures. Mildly depressed fracture of the right orbital floor and the anterior wall of the right maxillary sinus. Orbits: The globes and optic nerves are intact. Normal extraocular muscles and intraorbital fat. Sinuses: Blood in  the right maxillary sinus cavity Soft tissues: Right facial swelling IMPRESSION: 1. No acute intracranial abnormality. 2. Mildly displaced fractures of the right orbital floor and the anterior wall of the right maxillary sinus. 3. Minimally displaced nasal bone fractures. Electronically Signed   By: Deatra Robinson M.D.   On: 05/01/2023 22:22   CT Maxillofacial Wo Contrast  Result Date: 05/01/2023 CLINICAL DATA:  Recent fall EXAM: CT HEAD WITHOUT CONTRAST CT MAXILLOFACIAL WITHOUT CONTRAST TECHNIQUE: Multidetector CT imaging of the head and maxillofacial structures were performed using the standard protocol without intravenous contrast. Multiplanar CT image reconstructions of the maxillofacial structures were also generated. RADIATION DOSE REDUCTION: This exam was performed according to the departmental dose-optimization program which includes automated exposure control, adjustment of the mA  and/or kV according to patient size and/or use of iterative reconstruction technique. COMPARISON:  None Available. FINDINGS: CT HEAD FINDINGS Brain: There is no mass, hemorrhage or extra-axial collection. The size and configuration of the ventricles and extra-axial CSF spaces are normal. The brain parenchyma is normal, without evidence of acute or chronic infarction. Vascular: No hyperdense vessel or unexpected vascular calcification. Skull: The visualized skull base, calvarium and extracranial soft tissues are normal. CT MAXILLOFACIAL FINDINGS Osseous: Minimally displaced nasal bone fractures. Mildly depressed fracture of the right orbital floor and the anterior wall of the right maxillary sinus. Orbits: The globes and optic nerves are intact. Normal extraocular muscles and intraorbital fat. Sinuses: Blood in the right maxillary sinus cavity Soft tissues: Right facial swelling IMPRESSION: 1. No acute intracranial abnormality. 2. Mildly displaced fractures of the right orbital floor and the anterior wall of the right maxillary  sinus. 3. Minimally displaced nasal bone fractures. Electronically Signed   By: Deatra Robinson M.D.   On: 05/01/2023 22:22    Procedures Procedures    Medications Ordered in ED Medications  LORazepam (ATIVAN) tablet 1-4 mg ( Oral See Alternative 05/01/23 2217)    Or  LORazepam (ATIVAN) injection 1-4 mg (2 mg Intravenous Given 05/01/23 2217)  thiamine (VITAMIN B1) tablet 100 mg (has no administration in time range)    Or  thiamine (VITAMIN B1) injection 100 mg (has no administration in time range)  folic acid (FOLVITE) tablet 1 mg (has no administration in time range)  multivitamin with minerals tablet 1 tablet (has no administration in time range)  sodium chloride 0.9 % bolus 1,000 mL (has no administration in time range)  sodium chloride 0.9 % bolus 1,000 mL (1,000 mLs Intravenous New Bag/Given 05/01/23 2117)  LORazepam (ATIVAN) injection 2 mg (2 mg Intravenous Given 05/01/23 2117)    ED Course/ Medical Decision Making/ A&P    Patient seen and examined. History obtained directly from patient.  Reviewed previous hospitalization notes.  Labs/EKG: Labs ordered in triage reviewed including CBC which is unremarkable.  CMP, ethanol, salicylate and acetaminophen levels are pending.  Imaging: Ordered CT of the head and CT of the maxillofacial bones given recent trauma.  Medications/Fluids: Ordered: IV fluid bolus.   Most recent vital signs reviewed and are as follows: BP (!) 140/95   Pulse (!) 112   Temp 98.4 F (36.9 C)   Resp 10   SpO2 94%   Initial impression: Tachycardia, question of early withdrawal symptoms however patient just stopped drinking alcohol several hours ago.  Will evaluate extent of facial injuries.   11:06 PM Reassessment performed. Patient appears stable, sleepy after 2nd dose of ativan.   Labs personally reviewed and interpreted including: CMP with normal electrolytes, glucose 128, creatinine 1.13, AST 46 with normal ALT and bili; ethanol critically elevated at  368; salicylate and Tylenol undetectable.  Imaging personally visualized and interpreted including: CT head negative; CT maxillofacial agree with orbital floor and sinus wall fracture, nasal bone fractures.  Reviewed pertinent lab work and imaging with patient at bedside.  He was updated on findings on CT imaging and need for ENT follow-up.  Confirmed no signs of entrapment.  Most current vital signs reviewed and are as follows: BP 112/76   Pulse (!) 109   Temp 98.4 F (36.9 C)   Resp 16   SpO2 97%   Plan: Additional IV fluids, allow patient time to metabolize.  Reassess.  If abnormal vital signs or continues to be significantly agitated, consider admission for  alcohol withdrawal given high likelihood of decompensation.  If improved and he wants to go, may be stable for discharge.   Signout to [employer] at shift change.                                Medical Decision Making Amount and/or Complexity of Data Reviewed Radiology: ordered.  Risk OTC drugs. Prescription drug management.   Facial laceration/trauma: Subacute.  Diagnosed multiple facial fractures.  No signs of entrapment.  Steri-Strips in place over nose laceration.  CT head negative.  Alcohol intoxication/question of early withdrawal, patient agitated, likely due to intoxication.  Given Ativan, stable currently.        Final Clinical Impression(s) / ED Diagnoses Final diagnoses:  Agitation  Closed fracture of right orbital floor, initial encounter [facility])  Closed fracture of nasal bone, initial encounter  Alcoholic intoxication with complication [facility])    Rx / DC Orders ED Discharge Orders     None         Renne Crigler, PA-C 05/01/23 2312    Benjiman Core, MD 05/04/23 1452

## 2023-05-01 NOTE — ED Triage Notes (Signed)
Pt arrives requesting help with his alcohol use. Last etoh about 2 hours ago. He has dried blood and steri strips on his nose, says he fell about 3 days ago and was seen at [facility] for the same. Alert.

## 2023-05-02 DIAGNOSIS — F1090 Alcohol use, unspecified, uncomplicated: Secondary | ICD-10-CM | POA: Diagnosis present

## 2023-05-02 DIAGNOSIS — Y909 Presence of alcohol in blood, level not specified: Secondary | ICD-10-CM | POA: Diagnosis not present

## 2023-05-02 DIAGNOSIS — W19XXXA Unspecified fall, initial encounter: Secondary | ICD-10-CM

## 2023-05-02 DIAGNOSIS — R0902 Hypoxemia: Secondary | ICD-10-CM | POA: Diagnosis present

## 2023-05-02 DIAGNOSIS — Z888 Allergy status to other drugs, medicaments and biological substances status: Secondary | ICD-10-CM | POA: Diagnosis not present

## 2023-05-02 DIAGNOSIS — Z79899 Other long term (current) drug therapy: Secondary | ICD-10-CM | POA: Diagnosis not present

## 2023-05-02 DIAGNOSIS — E66811 Obesity, class 1: Secondary | ICD-10-CM | POA: Diagnosis present

## 2023-05-02 DIAGNOSIS — F10239 Alcohol dependence with withdrawal, unspecified: Secondary | ICD-10-CM | POA: Diagnosis present

## 2023-05-02 DIAGNOSIS — Z5321 Procedure and treatment not carried out due to patient leaving prior to being seen by health care provider: Secondary | ICD-10-CM | POA: Diagnosis not present

## 2023-05-02 DIAGNOSIS — F10939 Alcohol use, unspecified with withdrawal, unspecified: Secondary | ICD-10-CM | POA: Diagnosis present

## 2023-05-02 DIAGNOSIS — F10229 Alcohol dependence with intoxication, unspecified: Secondary | ICD-10-CM | POA: Diagnosis present

## 2023-05-02 DIAGNOSIS — F10930 Alcohol use, unspecified with withdrawal, uncomplicated: Secondary | ICD-10-CM | POA: Diagnosis not present

## 2023-05-02 DIAGNOSIS — S0292XA Unspecified fracture of facial bones, initial encounter for closed fracture: Secondary | ICD-10-CM

## 2023-05-02 DIAGNOSIS — Z6828 Body mass index (BMI) 28.0-28.9, adult: Secondary | ICD-10-CM | POA: Diagnosis not present

## 2023-05-02 DIAGNOSIS — F419 Anxiety disorder, unspecified: Secondary | ICD-10-CM | POA: Diagnosis present

## 2023-05-02 DIAGNOSIS — F1010 Alcohol abuse, uncomplicated: Secondary | ICD-10-CM | POA: Diagnosis not present

## 2023-05-02 DIAGNOSIS — Y908 Blood alcohol level of 240 mg/100 ml or more: Secondary | ICD-10-CM | POA: Diagnosis present

## 2023-05-02 DIAGNOSIS — R296 Repeated falls: Secondary | ICD-10-CM | POA: Diagnosis present

## 2023-05-02 DIAGNOSIS — F32A Depression, unspecified: Secondary | ICD-10-CM | POA: Diagnosis present

## 2023-05-02 DIAGNOSIS — Z9181 History of falling: Secondary | ICD-10-CM | POA: Diagnosis not present

## 2023-05-02 DIAGNOSIS — R000 Tachycardia, unspecified: Secondary | ICD-10-CM | POA: Diagnosis present

## 2023-05-02 LAB — RAPID URINE DRUG SCREEN, HOSP PERFORMED
Amphetamines: NOT DETECTED
Barbiturates: NOT DETECTED
Benzodiazepines: POSITIVE — AB
Cocaine: NOT DETECTED
Opiates: NOT DETECTED
Tetrahydrocannabinol: NOT DETECTED

## 2023-05-02 LAB — MAGNESIUM: Magnesium: 2.5 mg/dL — ABNORMAL HIGH (ref 1.7–2.4)

## 2023-05-02 MED ORDER — ACETAMINOPHEN 325 MG PO TABS
650.0000 mg | ORAL_TABLET | ORAL | Status: DC | PRN
  Administered 2023-05-03: 650 mg via ORAL
  Filled 2023-05-02: qty 2

## 2023-05-02 MED ORDER — ACETAMINOPHEN 500 MG PO TABS
1000.0000 mg | ORAL_TABLET | Freq: Once | ORAL | Status: AC
  Administered 2023-05-02: 1000 mg via ORAL
  Filled 2023-05-02: qty 2

## 2023-05-02 MED ORDER — PHENOBARBITAL 32.4 MG PO TABS: 64.8000 mg | ORAL_TABLET | Freq: Three times a day (TID) | ORAL | Status: DC

## 2023-05-02 MED ORDER — ADULT MULTIVITAMIN W/MINERALS CH
1.0000 | ORAL_TABLET | Freq: Every day | ORAL | Status: DC
Start: 2023-05-02 — End: 2023-05-03
  Administered 2023-05-02 – 2023-05-03 (×2): 1 via ORAL
  Filled 2023-05-02 (×2): qty 1

## 2023-05-02 MED ORDER — ADULT MULTIVITAMIN W/MINERALS CH: 1.0000 | ORAL_TABLET | Freq: Every day | ORAL | Status: DC

## 2023-05-02 MED ORDER — PHENOBARBITAL 32.4 MG PO TABS: 16.2000 mg | ORAL_TABLET | Freq: Three times a day (TID) | ORAL | Status: DC

## 2023-05-02 MED ORDER — THIAMINE MONONITRATE 100 MG PO TABS
100.0000 mg | ORAL_TABLET | Freq: Every day | ORAL | Status: DC
  Administered 2023-05-02 – 2023-05-03 (×2): 100 mg via ORAL
  Filled 2023-05-02 (×2): qty 1

## 2023-05-02 MED ORDER — ESCITALOPRAM OXALATE 20 MG PO TABS
20.0000 mg | ORAL_TABLET | Freq: Every day | ORAL | Status: DC
  Administered 2023-05-02 – 2023-05-03 (×2): 20 mg via ORAL
  Filled 2023-05-02: qty 2
  Filled 2023-05-02: qty 1

## 2023-05-02 MED ORDER — PHENOBARBITAL 32.4 MG PO TABS
97.2000 mg | ORAL_TABLET | Freq: Three times a day (TID) | ORAL | Status: DC
  Administered 2023-05-02 – 2023-05-03 (×2): 97.2 mg via ORAL
  Filled 2023-05-02 (×2): qty 3

## 2023-05-02 MED ORDER — PHENOBARBITAL 32.4 MG PO TABS: 32.4000 mg | ORAL_TABLET | Freq: Three times a day (TID) | ORAL | Status: DC

## 2023-05-02 MED ORDER — FOLIC ACID 1 MG PO TABS
1.0000 mg | ORAL_TABLET | Freq: Every day | ORAL | Status: DC
  Administered 2023-05-02 – 2023-05-03 (×2): 1 mg via ORAL
  Filled 2023-05-02 (×2): qty 1

## 2023-05-02 MED ORDER — SODIUM CHLORIDE 0.9 % IV SOLN
300.0000 mg | Freq: Once | INTRAVENOUS | Status: AC
  Administered 2023-05-02: 300 mg via INTRAVENOUS
  Filled 2023-05-02: qty 2.31

## 2023-05-02 MED ORDER — MELATONIN 5 MG PO TABS: 10.0000 mg | ORAL_TABLET | Freq: Every evening | ORAL | Status: DC | PRN

## 2023-05-02 MED ORDER — ONDANSETRON HCL 4 MG/2ML IJ SOLN: 4.0000 mg | Freq: Four times a day (QID) | INTRAMUSCULAR | Status: DC | PRN

## 2023-05-02 NOTE — Hospital Course (Signed)
HPI: Jermaine [name] is a 57 y.o. male with medical history significant of  Anxiety depression, obesity. Patient presents to ED seeking medically supervised detox. He states due his drinking he has had multiple falls, most recent of which was 3 days ago where he sustained facial trauma for which he was seen at [facility].  He states he is unable to quit on his own and his last drink was 2 hours PTA. Patient notes he typically has severe withdrawal. Per chart patient has severe withdrawal requiring icu sedation and intubation. Patient currently notes no sob/ chest pain / belly pain , diarrhea or dysuria but has had vomiting.  He denies blood or black stools.      ED Course:  Vitals:  Afeb, bp 157/101, hr 133 rr18 sat 95%  Of note patient ed course complicated by hypoxemia ( 88%)s/p treatment with ativan for which he was placed on o2 with recovery.  Labs Wbc 9.9/ hgb 16.4, plt248 Na 141, K 3.9, cl 103, glu 128, cr 1.13 AST 46 ETOH 368 Salicylate <7 Acetaminophen <10 CTH: IMPRESSION: 1. No acute intracranial abnormality. 2. Mildly displaced fractures of the right orbital floor and the anterior wall of the right maxillary sinus. 3. Minimally displaced nasal bone fractures. UDS:+ benzoTx  Ativan 2mg  iv,ivfs  Significant Events: Admitted 05/01/2023 for acute etoh withdrawal   Significant Labs: Admission WBC 9.9, HgB 16.4, BUN 18, Scr 1.13 Admission ETOH level 368, UDS positive for Benzos  Significant Imaging Studies: Admission CT head, face shows No acute intracranial abnormality. 2. Mildly displaced fractures of the right orbital floor and the anterior wall of the right maxillary sinus. 3. Minimally displaced nasal bone fractures.  Antibiotic Therapy: Anti-infectives (From admission, onward)    None       Procedures:   Consultants:

## 2023-05-02 NOTE — Assessment & Plan Note (Signed)
05-02-2023 stable.

## 2023-05-02 NOTE — Discharge Instructions (Signed)

## 2023-05-02 NOTE — Assessment & Plan Note (Signed)
05-02-2023 pt on CIWA but using excessive amounts of IV ativan. Just received 4 mg IV ativan and he is still awake and tremulous. Will load with 300 mg IV phenobarbital and start scheduled phenobarb taper.

## 2023-05-02 NOTE — ED Notes (Signed)
Intermittent restless with intermittent bed alarms, sleeping, NAD.

## 2023-05-02 NOTE — Assessment & Plan Note (Signed)
05-02-2023 will need CM for etoh abuse resources

## 2023-05-02 NOTE — Progress Notes (Signed)
Resting quietly.  Just recently given care after set up a the sink.  No distress noted.  Unsteady gait.  Assisted back to bed and verbalized understanding to call for assistance if he needs to get OOB.  Steristrips to nose intact and dry.  Mild drainage noted on right side of nose.  PIV right AC intact and patent and without redness, edema, and infiltration.

## 2023-05-02 NOTE — Assessment & Plan Note (Signed)
05-02-2023 pt able to move his eyes in all directions. No ocular muscle entrapment.

## 2023-05-02 NOTE — Progress Notes (Signed)
PROGRESS NOTE    Jermaine [name]  ION:[id number] DOB: 19-Feb-1966 DOA: 05/01/2023 PCP: Farris Has, MD  Subjective: Pt seen and examined. Just had 4 mg IV ativan for high CIWA scores. Pt is still awake and tremulous. Pt state drinks 1.5 liters of liquor Plus 24 beers per day. Has been doing this for several weeks.   Hospital Course: HPI: Jermaine [name] is a 57 y.o. male with medical history significant of  Anxiety depression, obesity. Patient presents to ED seeking medically supervised detox. He states due his drinking he has had multiple falls, most recent of which was 3 days ago where he sustained facial trauma for which he was seen at [facility].  He states he is unable to quit on his own and his last drink was 2 hours PTA. Patient notes he typically has severe withdrawal. Per chart patient has severe withdrawal requiring icu sedation and intubation. Patient currently notes no sob/ chest pain / belly pain , diarrhea or dysuria but has had vomiting.  He denies blood or black stools.      ED Course:  Vitals:  Afeb, bp 157/101, hr 133 rr18 sat 95%  Of note patient ed course complicated by hypoxemia ( 88%)s/p treatment with ativan for which he was placed on o2 with recovery.  Labs Wbc 9.9/ hgb 16.4, plt248 Na 141, K 3.9, cl 103, glu 128, cr 1.13 AST 46 ETOH 368 Salicylate <7 Acetaminophen <10 CTH: IMPRESSION: 1. No acute intracranial abnormality. 2. Mildly displaced fractures of the right orbital floor and the anterior wall of the right maxillary sinus. 3. Minimally displaced nasal bone fractures. UDS:+ benzoTx  Ativan 2mg  iv,ivfs  Significant Events: Admitted 05/01/2023 for acute etoh withdrawal   Significant Labs: Admission WBC 9.9, HgB 16.4, BUN 18, Scr 1.13 Admission ETOH level 368, UDS positive for Benzos  Significant Imaging Studies: Admission CT head, face shows No acute intracranial abnormality. 2. Mildly displaced fractures of the right orbital floor and the anterior wall of  the right maxillary sinus. 3. Minimally displaced nasal bone fractures.  Antibiotic Therapy: Anti-infectives (From admission, onward)    None       Procedures:   Consultants:     Assessment and Plan: * Alcohol withdrawal (HCC) 05-02-2023 pt on CIWA but using excessive amounts of IV ativan. Just received 4 mg IV ativan and he is still awake and tremulous. Will load with 300 mg IV phenobarbital and start scheduled phenobarb taper.  Chronic alcohol abuse 05-02-2023 will need CM for etoh abuse resources  Alcohol abuse with intoxication (HCC) 05-02-2023 pt on CIWA but using excessive amounts of IV ativan. Just received 4 mg IV ativan and he is still awake and tremulous. Will load with 300 mg IV phenobarbital and start scheduled phenobarb taper.  Facial fracture due to fall [facility]) 05-02-2023 pt able to move his eyes in all directions. No ocular muscle entrapment.  Class 1 obesity 05-02-2023 stable.   DVT prophylaxis: SCD     Code Status: Prior Family Communication: no family at bedside Disposition Plan: return home Reason for continuing need for hospitalization: remains on CIWA protocol.  Objective: Vitals:   05/02/23 1330 05/02/23 1345 05/02/23 1400 05/02/23 1442  BP: (!) 138/104 (!) 144/82 (!) 138/90 (!) 149/103  Pulse: (!) 103 (!) 102 (!) 102 100  Resp: 11 13 18 13   Temp:      TempSrc:      SpO2: 93% 98% 96% 94%    Intake/Output Summary (Last 24 hours) at 05/02/2023 1519 Last data  filed at 05/02/2023 5784 Gross per 24 hour  Intake 2000 ml  Output 375 ml  Net 1625 ml   There were no vitals filed for this visit.  Examination:  Physical Exam Vitals and nursing note reviewed.  HENT:     Head: Normocephalic.     Comments: Swelling of his nasal bridge    Nose:     Comments: Dried blood from right nare Cardiovascular:     Rate and Rhythm: Regular rhythm. Tachycardia present.  Pulmonary:     Effort: Pulmonary effort is normal.     Breath sounds: Normal breath  sounds.  Abdominal:     General: Abdomen is protuberant. Bowel sounds are normal.     Palpations: Abdomen is soft.     Tenderness: There is no abdominal tenderness.  Skin:    General: Skin is warm and dry.     Capillary Refill: Capillary refill takes less than 2 seconds.  Neurological:     Mental Status: He is alert and oriented to person, place, and time.     Data Reviewed: I have personally reviewed following labs and imaging studies  CBC: Recent Labs  Lab 05/01/23 2005  WBC 9.9  NEUTROABS 6.3  HGB 16.4  HCT 48.0  MCV 94.5  PLT 248   Basic Metabolic Panel: Recent Labs  Lab 05/01/23 2005  NA 141  K 3.9  CL 103  CO2 26  GLUCOSE 128*  BUN 18  CREATININE 1.13  CALCIUM 8.3*  MG 2.5*   GFR: CrCl cannot be calculated (Unknown ideal weight.). Liver Function Tests: Recent Labs  Lab 05/01/23 2005  AST 46*  ALT 40  ALKPHOS 66  BILITOT 0.6  PROT 6.4*  ALBUMIN 4.0    Radiology Studies: CT Head Wo Contrast  Result Date: 05/01/2023 CLINICAL DATA:  Recent fall EXAM: CT HEAD WITHOUT CONTRAST CT MAXILLOFACIAL WITHOUT CONTRAST TECHNIQUE: Multidetector CT imaging of the head and maxillofacial structures were performed using the standard protocol without intravenous contrast. Multiplanar CT image reconstructions of the maxillofacial structures were also generated. RADIATION DOSE REDUCTION: This exam was performed according to the departmental dose-optimization program which includes automated exposure control, adjustment of the mA and/or kV according to patient size and/or use of iterative reconstruction technique. COMPARISON:  None Available. FINDINGS: CT HEAD FINDINGS Brain: There is no mass, hemorrhage or extra-axial collection. The size and configuration of the ventricles and extra-axial CSF spaces are normal. The brain parenchyma is normal, without evidence of acute or chronic infarction. Vascular: No hyperdense vessel or unexpected vascular calcification. Skull: The  visualized skull base, calvarium and extracranial soft tissues are normal. CT MAXILLOFACIAL FINDINGS Osseous: Minimally displaced nasal bone fractures. Mildly depressed fracture of the right orbital floor and the anterior wall of the right maxillary sinus. Orbits: The globes and optic nerves are intact. Normal extraocular muscles and intraorbital fat. Sinuses: Blood in the right maxillary sinus cavity Soft tissues: Right facial swelling IMPRESSION: 1. No acute intracranial abnormality. 2. Mildly displaced fractures of the right orbital floor and the anterior wall of the right maxillary sinus. 3. Minimally displaced nasal bone fractures. Electronically Signed   By: Deatra Robinson M.D.   On: 05/01/2023 22:22   CT Maxillofacial Wo Contrast  Result Date: 05/01/2023 CLINICAL DATA:  Recent fall EXAM: CT HEAD WITHOUT CONTRAST CT MAXILLOFACIAL WITHOUT CONTRAST TECHNIQUE: Multidetector CT imaging of the head and maxillofacial structures were performed using the standard protocol without intravenous contrast. Multiplanar CT image reconstructions of the maxillofacial structures were also generated.  RADIATION DOSE REDUCTION: This exam was performed according to the departmental dose-optimization program which includes automated exposure control, adjustment of the mA and/or kV according to patient size and/or use of iterative reconstruction technique. COMPARISON:  None Available. FINDINGS: CT HEAD FINDINGS Brain: There is no mass, hemorrhage or extra-axial collection. The size and configuration of the ventricles and extra-axial CSF spaces are normal. The brain parenchyma is normal, without evidence of acute or chronic infarction. Vascular: No hyperdense vessel or unexpected vascular calcification. Skull: The visualized skull base, calvarium and extracranial soft tissues are normal. CT MAXILLOFACIAL FINDINGS Osseous: Minimally displaced nasal bone fractures. Mildly depressed fracture of the right orbital floor and the anterior  wall of the right maxillary sinus. Orbits: The globes and optic nerves are intact. Normal extraocular muscles and intraorbital fat. Sinuses: Blood in the right maxillary sinus cavity Soft tissues: Right facial swelling IMPRESSION: 1. No acute intracranial abnormality. 2. Mildly displaced fractures of the right orbital floor and the anterior wall of the right maxillary sinus. 3. Minimally displaced nasal bone fractures. Electronically Signed   By: Deatra Robinson M.D.   On: 05/01/2023 22:22    Scheduled Meds:  escitalopram  20 mg Oral Daily   folic acid  1 mg Oral Daily   multivitamin with minerals  1 tablet Oral Daily   phenobarbital  97.2 mg Oral TID   Followed by   Melene Muller ON 05/03/2023] phenobarbital  64.8 mg Oral TID   Followed by   Melene Muller ON 05/04/2023] phenobarbital  32.4 mg Oral TID   Followed by   Melene Muller ON 05/05/2023] phenobarbital  16.2 mg Oral TID   thiamine  100 mg Oral Daily   Continuous Infusions:  PHENObarbital       LOS: 0 days   Time spent: 40 minutes  Carollee Herter, DO  [facility]  05/02/2023, 3:19 PM

## 2023-05-02 NOTE — ED Notes (Signed)
Up to b/r with assistance, unsteady gait.

## 2023-05-02 NOTE — ED Notes (Signed)
Poor insight, states, "feel fine", asking about leaving, admission with rationale explained, moved to [place] [address], report given.

## 2023-05-02 NOTE — ED Notes (Signed)
Lab notified of Magnesium add on

## 2023-05-02 NOTE — H&P (Addendum)
History and Physical    Jermaine [name]:[id number] DOB: 1965-11-02 DOA: 05/01/2023  PCP: Farris Has, MD  Patient coming from: home  I have personally briefly reviewed patient's old medical records in [facility]  Chief Complaint: etoh w/d seeking medically assisted detox  HPI: Jermaine [name] is a 57 y.o. male with medical history significant of  Anxiety depression, obesity. Patient presents to ED seeking medically supervised detox. He states due his drinking he has had multiple falls, most recent of which was 3 days ago where he sustained facial trauma for which he was seen at [facility].  He states he is unable to quit on his own and his last drink was 2 hours PTA. Patient notes he typically has severe withdrawal. Per chart patient has severe withdrawal requiring icu sedation and intubation. Patient currently notes no sob/ chest pain / belly pain , diarrhea or dysuria but has had vomiting.  He denies blood or black stools.    ED Course:  Vitals:  Afeb, bp 157/101, hr 133 rr18 sat 95%  Of note patient ed course complicated by hypoxemia ( 88%)s/p treatment with ativan for which he was placed on o2 with recovery.  Labs Wbc 9.9/ hgb 16.4, plt248 Na 141, K 3.9, cl 103, glu 128, cr 1.13 AST 46 ETOH 368 Salicylate <7 Acetaminophen <10 CTH: IMPRESSION: 1. No acute intracranial abnormality. 2. Mildly displaced fractures of the right orbital floor and the anterior wall of the right maxillary sinus. 3. Minimally displaced nasal bone fractures. UDS:+ benzoTx  Ativan 2mg  iv,ivfs   Review of Systems: As per HPI otherwise 10 point review of systems negative.   Past Medical History:  Diagnosis Date   Alcohol abuse    Anxiety    Class 1 obesity 09/14/2021   Depression     Past Surgical History:  Procedure Laterality Date   HERNIA REPAIR       reports that he has never smoked. He has never used smokeless tobacco. He reports current alcohol use of about 20.0 standard drinks of alcohol  per week. He reports that he does not currently use drugs.  Allergies  Allergen Reactions   Ketamine     Hx of causing fever in 2024     Family History  Adopted: Yes    Prior to Admission medications   Medication Sig Start Date End Date Taking? Authorizing Provider  escitalopram (LEXAPRO) 20 MG tablet Take 20 mg by mouth daily. 02/27/19  Yes [provider]  folic acid (FOLVITE) 1 MG tablet Take 1 tablet (1 mg total) by mouth daily. 01/28/23  Yes Danford, Earl Lites, MD  hydrOXYzine (ATARAX) 25 MG tablet Take 1 tablet (25 mg total) by mouth 3 (three) times daily as needed for itching or anxiety. 01/28/23  Yes Danford, Earl Lites, MD  Multiple Vitamin (MULTIVITAMIN WITH MINERALS) TABS tablet Take 1 tablet by mouth daily. 05/20/22  Yes [name], MD  thiamine (VITAMIN B-1) 100 MG tablet Take 1 tablet (100 mg total) by mouth daily. 05/20/22  Yes [name], MD  clindamycin (CLEOCIN) 300 MG capsule Take 300 mg by mouth 3 (three) times daily. For 10 days Patient not taking: Reported on 05/02/2023 04/30/23 05/10/23  [provider]  naltrexone (DEPADE) 50 MG tablet Take 1 tablet (50 mg total) by mouth daily. Patient not taking: Reported on 05/02/2023 01/28/23   Alberteen Sam, MD    Physical Exam: Vitals:   05/02/23 0430 05/02/23 0443 05/02/23 0500 05/02/23 0600  BP: (!) 138/97   Marland Kitchen)  143/85  Pulse: (!) 104   (!) 102  Resp: 16  13 18   Temp:  98.4 F (36.9 C)    TempSrc:  Oral    SpO2: 100%  95% 98%    Constitutional: NAD, calm, comfortable Vitals:   05/02/23 0430 05/02/23 0443 05/02/23 0500 05/02/23 0600  BP: (!) 138/97   (!) 143/85  Pulse: (!) 104   (!) 102  Resp: 16  13 18   Temp:  98.4 F (36.9 C)    TempSrc:  Oral    SpO2: 100%  95% 98%   Eyes: PERRL, lids and conjunctivae normal ENMT: blood in nares, face/nose with area of ecchymosis  Neck: normal, supple, no masses, no thyromegaly Respiratory: clear to auscultation bilaterally, no  wheezing, no crackles. Normal respiratory effort. No accessory muscle use.  Cardiovascular: Regular rate and rhythm, no murmurs / rubs / gallops. No extremity edema. 2+ pedal pulses.  Abdomen: no tenderness, no masses palpated. No hepatosplenomegaly. Bowel sounds positive.  Musculoskeletal: no clubbing / cyanosis. No joint deformity upper and lower extremities. Good ROM, no contractures. Normal muscle tone.  Skin: no rashes, lesions, ulcers. No induration Neurologic: CN 2-12 grossly intact. Sensation intact,  Strength 5/5 in all 4.  Psychiatric: Normal judgment and insight. Alert and oriented x 3. Normal mood.    Labs on Admission: I have personally reviewed following labs and imaging studies  CBC: Recent Labs  Lab 05/01/23 2005  WBC 9.9  NEUTROABS 6.3  HGB 16.4  HCT 48.0  MCV 94.5  PLT 248   Basic Metabolic Panel: Recent Labs  Lab 05/01/23 2005  NA 141  K 3.9  CL 103  CO2 26  GLUCOSE 128*  BUN 18  CREATININE 1.13  CALCIUM 8.3*  MG 2.5*   GFR: CrCl cannot be calculated (Unknown ideal weight.). Liver Function Tests: Recent Labs  Lab 05/01/23 2005  AST 46*  ALT 40  ALKPHOS 66  BILITOT 0.6  PROT 6.4*  ALBUMIN 4.0   No results for input(s): "LIPASE", "AMYLASE" in the last 168 hours. No results for input(s): "AMMONIA" in the last 168 hours. Coagulation Profile: No results for input(s): "INR", "PROTIME" in the last 168 hours. Cardiac Enzymes: No results for input(s): "CKTOTAL", "CKMB", "CKMBINDEX", "TROPONINI" in the last 168 hours. BNP (last 3 results) No results for input(s): "PROBNP" in the last 8760 hours. HbA1C: No results for input(s): "HGBA1C" in the last 72 hours. CBG: No results for input(s): "GLUCAP" in the last 168 hours. Lipid Profile: No results for input(s): "CHOL", "HDL", "LDLCALC", "TRIG", "CHOLHDL", "LDLDIRECT" in the last 72 hours. Thyroid Function Tests: No results for input(s): "TSH", "T4TOTAL", "FREET4", "T3FREE", "THYROIDAB" in the last  72 hours. Anemia Panel: No results for input(s): "VITAMINB12", "FOLATE", "FERRITIN", "TIBC", "IRON", "RETICCTPCT" in the last 72 hours. Urine analysis:    Component Value Date/Time   COLORURINE AMBER (A) 01/17/2023 1035   APPEARANCEUR CLOUDY (A) 01/17/2023 1035   LABSPEC 1.030 01/17/2023 1035   PHURINE 5.0 01/17/2023 1035   GLUCOSEU NEGATIVE 01/17/2023 1035   HGBUR MODERATE (A) 01/17/2023 1035   BILIRUBINUR NEGATIVE 01/17/2023 1035   KETONESUR NEGATIVE 01/17/2023 1035   PROTEINUR 100 (A) 01/17/2023 1035   NITRITE NEGATIVE 01/17/2023 1035   LEUKOCYTESUR NEGATIVE 01/17/2023 1035    Radiological Exams on Admission: CT Head Wo Contrast  Result Date: 05/01/2023 CLINICAL DATA:  Recent fall EXAM: CT HEAD WITHOUT CONTRAST CT MAXILLOFACIAL WITHOUT CONTRAST TECHNIQUE: Multidetector CT imaging of the head and maxillofacial structures were performed using the standard  protocol without intravenous contrast. Multiplanar CT image reconstructions of the maxillofacial structures were also generated. RADIATION DOSE REDUCTION: This exam was performed according to the departmental dose-optimization program which includes automated exposure control, adjustment of the mA and/or kV according to patient size and/or use of iterative reconstruction technique. COMPARISON:  None Available. FINDINGS: CT HEAD FINDINGS Brain: There is no mass, hemorrhage or extra-axial collection. The size and configuration of the ventricles and extra-axial CSF spaces are normal. The brain parenchyma is normal, without evidence of acute or chronic infarction. Vascular: No hyperdense vessel or unexpected vascular calcification. Skull: The visualized skull base, calvarium and extracranial soft tissues are normal. CT MAXILLOFACIAL FINDINGS Osseous: Minimally displaced nasal bone fractures. Mildly depressed fracture of the right orbital floor and the anterior wall of the right maxillary sinus. Orbits: The globes and optic nerves are intact.  Normal extraocular muscles and intraorbital fat. Sinuses: Blood in the right maxillary sinus cavity Soft tissues: Right facial swelling IMPRESSION: 1. No acute intracranial abnormality. 2. Mildly displaced fractures of the right orbital floor and the anterior wall of the right maxillary sinus. 3. Minimally displaced nasal bone fractures. Electronically Signed   By: Deatra Robinson M.D.   On: 05/01/2023 22:22   CT Maxillofacial Wo Contrast  Result Date: 05/01/2023 CLINICAL DATA:  Recent fall EXAM: CT HEAD WITHOUT CONTRAST CT MAXILLOFACIAL WITHOUT CONTRAST TECHNIQUE: Multidetector CT imaging of the head and maxillofacial structures were performed using the standard protocol without intravenous contrast. Multiplanar CT image reconstructions of the maxillofacial structures were also generated. RADIATION DOSE REDUCTION: This exam was performed according to the departmental dose-optimization program which includes automated exposure control, adjustment of the mA and/or kV according to patient size and/or use of iterative reconstruction technique. COMPARISON:  None Available. FINDINGS: CT HEAD FINDINGS Brain: There is no mass, hemorrhage or extra-axial collection. The size and configuration of the ventricles and extra-axial CSF spaces are normal. The brain parenchyma is normal, without evidence of acute or chronic infarction. Vascular: No hyperdense vessel or unexpected vascular calcification. Skull: The visualized skull base, calvarium and extracranial soft tissues are normal. CT MAXILLOFACIAL FINDINGS Osseous: Minimally displaced nasal bone fractures. Mildly depressed fracture of the right orbital floor and the anterior wall of the right maxillary sinus. Orbits: The globes and optic nerves are intact. Normal extraocular muscles and intraorbital fat. Sinuses: Blood in the right maxillary sinus cavity Soft tissues: Right facial swelling IMPRESSION: 1. No acute intracranial abnormality. 2. Mildly displaced fractures of  the right orbital floor and the anterior wall of the right maxillary sinus. 3. Minimally displaced nasal bone fractures. Electronically Signed   By: Deatra Robinson M.D.   On: 05/01/2023 22:22    EKG: Independently reviewed. pending  Assessment/Plan  ETOH abuse with active withdrawal  -in setting of hx of severe withdrawal requiring intubation -admit to progressive care  -continue on ciwa  - monitor on continuous pulse ox  - mvi/thiamine /folate   Hx of Fall with facial fracture -non-surgical  -supportive care for pain   Anxiety  Depression -resume home regimen  with lexapro     DVT prophylaxis: heparin Code Status: full/ as discussed per patient wishes in event of cardiac arrest  Family Communication: none at bedside Disposition Plan: patient  expected to be admitted greater than 2 midnights  Consults called: n/a Admission status: progressive care   Lurline Del MD [facility]   If 7PM-7AM, please contact night-coverage [email or web address] Password [facility]  05/02/2023, 6:37 AM

## 2023-05-02 NOTE — Subjective & Objective (Signed)
Pt seen and examined. Just had 4 mg IV ativan for high CIWA scores. Pt is still awake and tremulous. Pt state drinks 1.5 liters of liquor Plus 24 beers per day. Has been doing this for several weeks.

## 2023-05-02 NOTE — Progress Notes (Signed)
CSW received consult for substance abuse resources - resources were added to AVS.  Edwin Dada, MSW, LCSW Transitions of Care  Clinical Social Worker II [phone]

## 2023-05-02 NOTE — ED Notes (Signed)
ED TO INPATIENT HANDOFF REPORT  ED Nurse Name and Phone #: Topher RN [phone]  S Name/Age/Gender Jermaine [name] 57 y.o. male Room/Bed: 002C/002C  Code Status   Code Status: Prior  Home/SNF/Other Home Patient oriented to: self, place, time, and situation Is this baseline? Yes   Triage Complete: Triage complete  Chief Complaint Alcohol withdrawal [facility]) [F10.939]  Triage Note Pt arrives requesting help with his alcohol use. Last etoh about 2 hours ago. He has dried blood and steri strips on his nose, says he fell about 3 days ago and was seen at [facility] for the same. Alert.    Allergies Allergies  Allergen Reactions   Ketamine     Hx of causing fever in 2024     Level of Care/Admitting Diagnosis ED Disposition     ED Disposition  Admit   Condition  --   Comment  Hospital Area: [facility] [[id number]]  Level of Care: Progressive [102]  Admit to Progressive based on following criteria: Other see comments  Comments: etoh w/d  May admit patient to [name] or [facility] if equivalent level of care is available:: No  Covid Evaluation: Asymptomatic - no recent exposure (last 10 days) testing not required  Diagnosis: Alcohol withdrawal (HCC) [291.81.ICD-9-CM]  Admitting Physician: Lurline Del [[id number]]  Attending Physician: Lurline Del [[id number]]  Certification:: I certify this patient will need inpatient services for at least 2 midnights  Expected Medical Readiness: 05/07/2023          B Medical/Surgery History Past Medical History:  Diagnosis Date   Alcohol abuse    Anxiety    Class 1 obesity 09/14/2021   Depression    Past Surgical History:  Procedure Laterality Date   HERNIA REPAIR       A IV Location/Drains/Wounds Patient Lines/Drains/Airways Status     Active Line/Drains/Airways     Name Placement date Placement time Site Days   Peripheral IV 05/01/23 20 G Right Antecubital 05/01/23  2117  Antecubital  1             Intake/Output Last 24 hours  Intake/Output Summary (Last 24 hours) at 05/02/2023 1334 Last data filed at 05/02/2023 4696 Gross per 24 hour  Intake 2000 ml  Output 375 ml  Net 1625 ml    Labs/Imaging Results for orders placed or performed during the hospital encounter of 05/01/23 (from the past 48 hour(s))  Comprehensive metabolic panel     Status: Abnormal   Collection Time: 05/01/23  8:05 PM  Result Value Ref Range   Sodium 141 135 - 145 mmol/L   Potassium 3.9 3.5 - 5.1 mmol/L   Chloride 103 98 - 111 mmol/L   CO2 26 22 - 32 mmol/L   Glucose, Bld 128 (H) 70 - 99 mg/dL    Comment: Glucose reference range applies only to samples taken after fasting for at least 8 hours.   BUN 18 6 - 20 mg/dL   Creatinine, Ser 2.95 0.61 - 1.24 mg/dL   Calcium 8.3 (L) 8.9 - 10.3 mg/dL   Total Protein 6.4 (L) 6.5 - 8.1 g/dL   Albumin 4.0 3.5 - 5.0 g/dL   AST 46 (H) 15 - 41 U/L   ALT 40 0 - 44 U/L   Alkaline Phosphatase 66 38 - 126 U/L   Total Bilirubin 0.6 <1.2 mg/dL   GFR, Estimated >28 >41 mL/min    Comment: (NOTE) Calculated using the CKD-EPI Creatinine Equation (2021)    Anion gap  12 5 - 15    Comment: Performed at [facility], [address], [place], [place] [address]  Ethanol     Status: Abnormal   Collection Time: 05/01/23  8:05 PM  Result Value Ref Range   Alcohol, Ethyl (B) 368 (HH) <10 mg/dL    Comment: CRITICAL RESULT CALLED TO, READ BACK BY AND VERIFIED WITH L.NORDELL RN 2103 04/01/2023 [facility]NA[facility] (NOTE) Lowest detectable limit for serum alcohol is 10 mg/dL.  For medical purposes only. Performed at [facility], [address], [place], [place] [address]   CBC with Diff     Status: None   Collection Time: 05/01/23  8:05 PM  Result Value Ref Range   WBC 9.9 4.0 - 10.5 K/uL   RBC 5.08 4.22 - 5.81 MIL/uL   Hemoglobin 16.4 13.0 - 17.0 g/dL   HCT 09.8 11.9 - 14.7 %   MCV 94.5 80.0 - 100.0 fL   MCH 32.3 26.0 - 34.0 pg   MCHC 34.2 30.0 - 36.0 g/dL   RDW  82.9 56.2 - 13.0 %   Platelets 248 150 - 400 K/uL   nRBC 0.0 0.0 - 0.2 %   Neutrophils Relative % 62 %   Neutro Abs 6.3 1.7 - 7.7 K/uL   Lymphocytes Relative 26 %   Lymphs Abs 2.6 0.7 - 4.0 K/uL   Monocytes Relative 10 %   Monocytes Absolute 1.0 0.1 - 1.0 K/uL   Eosinophils Relative 0 %   Eosinophils Absolute 0.0 0.0 - 0.5 K/uL   Basophils Relative 1 %   Basophils Absolute 0.1 0.0 - 0.1 K/uL   Immature Granulocytes 1 %   Abs Immature Granulocytes 0.05 0.00 - 0.07 K/uL    Comment: Performed at [facility], [address] [place], [place] [address]  Salicylate level     Status: Abnormal   Collection Time: 05/01/23  8:05 PM  Result Value Ref Range   Salicylate Lvl <7.0 (L) 7.0 - 30.0 mg/dL    Comment: Performed at [facility], [address] [place], [place] [address]  Acetaminophen level     Status: Abnormal   Collection Time: 05/01/23  8:05 PM  Result Value Ref Range   Acetaminophen (Tylenol), Serum <10 (L) 10 - 30 ug/mL    Comment: (NOTE) Therapeutic concentrations vary significantly. A range of 10-30 ug/mL  may be an effective concentration for many patients. However, some  are best treated at concentrations outside of this range. Acetaminophen concentrations >150 ug/mL at 4 hours after ingestion  and >50 ug/mL at 12 hours after ingestion are often associated with  toxic reactions.  Performed at [facility], [address] [place], [place] [address]   Magnesium     Status: Abnormal   Collection Time: 05/01/23  8:05 PM  Result Value Ref Range   Magnesium 2.5 (H) 1.7 - 2.4 mg/dL    Comment: Performed at [facility], [address], [place], [place] [address]  Urine rapid drug screen (hosp performed)     Status: Abnormal   Collection Time: 05/02/23  4:36 AM  Result Value Ref Range   Opiates NONE DETECTED NONE DETECTED   Cocaine NONE DETECTED NONE DETECTED   Benzodiazepines POSITIVE (A) NONE DETECTED   Amphetamines NONE DETECTED NONE DETECTED    Tetrahydrocannabinol NONE DETECTED NONE DETECTED   Barbiturates NONE DETECTED NONE DETECTED    Comment: (NOTE) DRUG SCREEN FOR MEDICAL PURPOSES ONLY.  IF CONFIRMATION  IS NEEDED FOR ANY PURPOSE, NOTIFY LAB WITHIN 5 DAYS.  LOWEST DETECTABLE LIMITS FOR URINE DRUG SCREEN Drug Class                     Cutoff (ng/mL) Amphetamine and metabolites    1000 Barbiturate and metabolites    200 Benzodiazepine                 200 Opiates and metabolites        300 Cocaine and metabolites        300 THC                            50 Performed at [facility], [address], [place], [place] [address]    CT Head Wo Contrast  Result Date: 05/01/2023 CLINICAL DATA:  Recent fall EXAM: CT HEAD WITHOUT CONTRAST CT MAXILLOFACIAL WITHOUT CONTRAST TECHNIQUE: Multidetector CT imaging of the head and maxillofacial structures were performed using the standard protocol without intravenous contrast. Multiplanar CT image reconstructions of the maxillofacial structures were also generated. RADIATION DOSE REDUCTION: This exam was performed according to the departmental dose-optimization program which includes automated exposure control, adjustment of the mA and/or kV according to patient size and/or use of iterative reconstruction technique. COMPARISON:  None Available. FINDINGS: CT HEAD FINDINGS Brain: There is no mass, hemorrhage or extra-axial collection. The size and configuration of the ventricles and extra-axial CSF spaces are normal. The brain parenchyma is normal, without evidence of acute or chronic infarction. Vascular: No hyperdense vessel or unexpected vascular calcification. Skull: The visualized skull base, calvarium and extracranial soft tissues are normal. CT MAXILLOFACIAL FINDINGS Osseous: Minimally displaced nasal bone fractures. Mildly depressed fracture of the right orbital floor and the anterior wall of the right maxillary sinus. Orbits: The globes and optic nerves are intact. Normal  extraocular muscles and intraorbital fat. Sinuses: Blood in the right maxillary sinus cavity Soft tissues: Right facial swelling IMPRESSION: 1. No acute intracranial abnormality. 2. Mildly displaced fractures of the right orbital floor and the anterior wall of the right maxillary sinus. 3. Minimally displaced nasal bone fractures. Electronically Signed   By: Deatra Robinson M.D.   On: 05/01/2023 22:22   CT Maxillofacial Wo Contrast  Result Date: 05/01/2023 CLINICAL DATA:  Recent fall EXAM: CT HEAD WITHOUT CONTRAST CT MAXILLOFACIAL WITHOUT CONTRAST TECHNIQUE: Multidetector CT imaging of the head and maxillofacial structures were performed using the standard protocol without intravenous contrast. Multiplanar CT image reconstructions of the maxillofacial structures were also generated. RADIATION DOSE REDUCTION: This exam was performed according to the departmental dose-optimization program which includes automated exposure control, adjustment of the mA and/or kV according to patient size and/or use of iterative reconstruction technique. COMPARISON:  None Available. FINDINGS: CT HEAD FINDINGS Brain: There is no mass, hemorrhage or extra-axial collection. The size and configuration of the ventricles and extra-axial CSF spaces are normal. The brain parenchyma is normal, without evidence of acute or chronic infarction. Vascular: No hyperdense vessel or unexpected vascular calcification. Skull: The visualized skull base, calvarium and extracranial soft tissues are normal. CT MAXILLOFACIAL FINDINGS Osseous: Minimally displaced nasal bone fractures. Mildly depressed fracture of the right orbital floor and the anterior wall of the right maxillary sinus. Orbits: The globes and optic nerves are intact. Normal extraocular muscles and intraorbital fat. Sinuses: Blood in the right maxillary sinus cavity Soft tissues: Right facial swelling IMPRESSION: 1. No acute intracranial abnormality. 2. Mildly displaced fractures  of the right  orbital floor and the anterior wall of the right maxillary sinus. 3. Minimally displaced nasal bone fractures. Electronically Signed   By: Deatra Robinson M.D.   On: 05/01/2023 22:22    Pending Labs Unresulted Labs (From admission, onward)    None       Vitals/Pain Today's Vitals   05/02/23 1215 05/02/23 1230 05/02/23 1245 05/02/23 1300  BP: (!) 137/93 (!) 134/92 (!) 145/94 135/87  Pulse: 96 91 99 (!) 108  Resp: 19 (!) 23  16  Temp:    99 F (37.2 C)  TempSrc:      SpO2: 90% 91%  96%  PainSc:        Isolation Precautions No active isolations  Medications Medications  LORazepam (ATIVAN) tablet 1-4 mg (2 mg Oral Given 05/02/23 1332)    Or  LORazepam (ATIVAN) injection 1-4 mg ( Intravenous See Alternative 05/02/23 1332)  escitalopram (LEXAPRO) tablet 20 mg (20 mg Oral Given 05/02/23 1028)  folic acid (FOLVITE) tablet 1 mg (1 mg Oral Given 05/02/23 1028)  multivitamin with minerals tablet 1 tablet (1 tablet Oral Given 05/02/23 1028)  thiamine (VITAMIN B1) tablet 100 mg (100 mg Oral Given 05/02/23 1029)  sodium chloride 0.9 % bolus 1,000 mL (0 mLs Intravenous Stopped 05/02/23 0004)  LORazepam (ATIVAN) injection 2 mg (2 mg Intravenous Given 05/01/23 2117)  sodium chloride 0.9 % bolus 1,000 mL (0 mLs Intravenous Stopped 05/02/23 0058)    Mobility walks      R Recommendations: See Admitting Provider Note  Report given to: 1H08

## 2023-05-02 NOTE — Plan of Care (Signed)
  Problem: Education: Goal: Knowledge of General Education information will improve Description: Including pain rating scale, medication(s)/side effects and non-pharmacologic comfort measures Outcome: Progressing   Problem: Clinical Measurements: Goal: Ability to maintain clinical measurements within normal limits will improve Outcome: Progressing Goal: Will remain free from infection Outcome: Progressing Goal: Cardiovascular complication will be avoided Outcome: Progressing   Problem: Nutrition: Goal: Adequate nutrition will be maintained Outcome: Progressing   Problem: Coping: Goal: Level of anxiety will decrease Outcome: Progressing   Problem: Pain Management: Goal: General experience of comfort will improve Outcome: Progressing

## 2023-05-02 NOTE — Progress Notes (Signed)
Late entry: 1600 - Reassessed pts CIWA score after giving ativan, score increased to 27. MD Imogene Burn notified. IVPB phenobarbital 300mg  has been ordered and is given.

## 2023-05-03 ENCOUNTER — Other Ambulatory Visit: Payer: Self-pay

## 2023-05-03 ENCOUNTER — Emergency Department (HOSPITAL_COMMUNITY)
Admission: EM | Admit: 2023-05-03 | Discharge: 2023-05-04 | Discharge status: Against Medical Advice | Payer: BLUE CROSS/BLUE SHIELD | Attending: Emergency Medicine | Admitting: Emergency Medicine

## 2023-05-03 ENCOUNTER — Encounter (HOSPITAL_COMMUNITY): Payer: Self-pay

## 2023-05-03 ENCOUNTER — Inpatient Hospital Stay (HOSPITAL_COMMUNITY): Payer: BLUE CROSS/BLUE SHIELD

## 2023-05-03 DIAGNOSIS — S0230XA Fracture of orbital floor, unspecified side, initial encounter for closed fracture: Secondary | ICD-10-CM | POA: Insufficient documentation

## 2023-05-03 DIAGNOSIS — F10930 Alcohol use, unspecified with withdrawal, uncomplicated: Secondary | ICD-10-CM | POA: Diagnosis not present

## 2023-05-03 DIAGNOSIS — Y909 Presence of alcohol in blood, level not specified: Secondary | ICD-10-CM | POA: Insufficient documentation

## 2023-05-03 DIAGNOSIS — F10939 Alcohol use, unspecified with withdrawal, unspecified: Secondary | ICD-10-CM | POA: Diagnosis not present

## 2023-05-03 DIAGNOSIS — R000 Tachycardia, unspecified: Secondary | ICD-10-CM | POA: Diagnosis not present

## 2023-05-03 DIAGNOSIS — Z5321 Procedure and treatment not carried out due to patient leaving prior to being seen by health care provider: Secondary | ICD-10-CM | POA: Insufficient documentation

## 2023-05-03 DIAGNOSIS — F1090 Alcohol use, unspecified, uncomplicated: Secondary | ICD-10-CM | POA: Diagnosis present

## 2023-05-03 MED ORDER — SALINE SPRAY 0.65 % NA SOLN: 2.0000 | NASAL | Status: DC | PRN

## 2023-05-03 NOTE — Consult Note (Signed)
Reason for Consult: Facial trauma Referring Physician: Leroy Sea, MD  Jermaine [name] is an 57 y.o. male.  HPI: History of alcohol withdrawal, fell on his face on Monday.  Complains of the numbness of the right and left midface area.  No visual changes.  No other complaints.  Past Medical History:  Diagnosis Date   Alcohol abuse    Anxiety    Class 1 obesity 09/14/2021   Depression     Past Surgical History:  Procedure Laterality Date   HERNIA REPAIR      Family History  Adopted: Yes    Social History:  reports that he has never smoked. He has never used smokeless tobacco. He reports current alcohol use of about 20.0 standard drinks of alcohol per week. He reports that he does not currently use drugs.  Allergies:  Allergies  Allergen Reactions   Ketamine     Hx of causing fever in 2024     Medications: Reviewed  Results for orders placed or performed during the hospital encounter of 05/01/23 (from the past 48 hour(s))  Comprehensive metabolic panel     Status: Abnormal   Collection Time: 05/01/23  8:05 PM  Result Value Ref Range   Sodium 141 135 - 145 mmol/L   Potassium 3.9 3.5 - 5.1 mmol/L   Chloride 103 98 - 111 mmol/L   CO2 26 22 - 32 mmol/L   Glucose, Bld 128 (H) 70 - 99 mg/dL    Comment: Glucose reference range applies only to samples taken after fasting for at least 8 hours.   BUN 18 6 - 20 mg/dL   Creatinine, Ser 8.29 0.61 - 1.24 mg/dL   Calcium 8.3 (L) 8.9 - 10.3 mg/dL   Total Protein 6.4 (L) 6.5 - 8.1 g/dL   Albumin 4.0 3.5 - 5.0 g/dL   AST 46 (H) 15 - 41 U/L   ALT 40 0 - 44 U/L   Alkaline Phosphatase 66 38 - 126 U/L   Total Bilirubin 0.6 <1.2 mg/dL   GFR, Estimated >56 >21 mL/min    Comment: (NOTE) Calculated using the CKD-EPI Creatinine Equation (2021)    Anion gap 12 5 - 15    Comment: Performed at [facility], [address] [place], [place] [address]  Ethanol     Status: Abnormal   Collection Time: 05/01/23  8:05 PM  Result Value  Ref Range   Alcohol, Ethyl (B) 368 (HH) <10 mg/dL    Comment: CRITICAL RESULT CALLED TO, READ BACK BY AND VERIFIED WITH L.NORDELL RN 2103 04/01/2023 [facility]GANADEN (NOTE) Lowest detectable limit for serum alcohol is 10 mg/dL.  For medical purposes only. Performed at [facility], [address] [place], [place] [address]   CBC with Diff     Status: None   Collection Time: 05/01/23  8:05 PM  Result Value Ref Range   WBC 9.9 4.0 - 10.5 K/uL   RBC 5.08 4.22 - 5.81 MIL/uL   Hemoglobin 16.4 13.0 - 17.0 g/dL   HCT 62.9 52.8 - 41.3 %   MCV 94.5 80.0 - 100.0 fL   MCH 32.3 26.0 - 34.0 pg   MCHC 34.2 30.0 - 36.0 g/dL   RDW 24.4 01.0 - 27.2 %   Platelets 248 150 - 400 K/uL   nRBC 0.0 0.0 - 0.2 %   Neutrophils Relative % 62 %   Neutro Abs 6.3 1.7 - 7.7 K/uL   Lymphocytes Relative 26 %   Lymphs  Abs 2.6 0.7 - 4.0 K/uL   Monocytes Relative 10 %   Monocytes Absolute 1.0 0.1 - 1.0 K/uL   Eosinophils Relative 0 %   Eosinophils Absolute 0.0 0.0 - 0.5 K/uL   Basophils Relative 1 %   Basophils Absolute 0.1 0.0 - 0.1 K/uL   Immature Granulocytes 1 %   Abs Immature Granulocytes 0.05 0.00 - 0.07 K/uL    Comment: Performed at [facility], [address] [place], [place] [address]  Salicylate level     Status: Abnormal   Collection Time: 05/01/23  8:05 PM  Result Value Ref Range   Salicylate Lvl <7.0 (L) 7.0 - 30.0 mg/dL    Comment: Performed at [facility], [address] [place], [place] [address]  Acetaminophen level     Status: Abnormal   Collection Time: 05/01/23  8:05 PM  Result Value Ref Range   Acetaminophen (Tylenol), Serum <10 (L) 10 - 30 ug/mL    Comment: (NOTE) Therapeutic concentrations vary significantly. A range of 10-30 ug/mL  may be an effective concentration for many patients. However, some  are best treated at concentrations outside of this range. Acetaminophen concentrations >150 ug/mL at 4 hours after ingestion  and >50 ug/mL at 12 hours after  ingestion are often associated with  toxic reactions.  Performed at [facility], [address] [place], [place] [address]   Magnesium     Status: Abnormal   Collection Time: 05/01/23  8:05 PM  Result Value Ref Range   Magnesium 2.5 (H) 1.7 - 2.4 mg/dL    Comment: Performed at [facility], [address] [place], [place] [address]  Urine rapid drug screen (hosp performed)     Status: Abnormal   Collection Time: 05/02/23  4:36 AM  Result Value Ref Range   Opiates NONE DETECTED NONE DETECTED   Cocaine NONE DETECTED NONE DETECTED   Benzodiazepines POSITIVE (A) NONE DETECTED   Amphetamines NONE DETECTED NONE DETECTED   Tetrahydrocannabinol NONE DETECTED NONE DETECTED   Barbiturates NONE DETECTED NONE DETECTED    Comment: (NOTE) DRUG SCREEN FOR MEDICAL PURPOSES ONLY.  IF CONFIRMATION IS NEEDED FOR ANY PURPOSE, NOTIFY LAB WITHIN 5 DAYS.  LOWEST DETECTABLE LIMITS FOR URINE DRUG SCREEN Drug Class                     Cutoff (ng/mL) Amphetamine and metabolites    1000 Barbiturate and metabolites    200 Benzodiazepine                 200 Opiates and metabolites        300 Cocaine and metabolites        300 THC                            50 Performed at [facility], [address], [place], [place] [address]     DG Chest Port 1 View  Result Date: 05/03/2023 CLINICAL DATA:  Short of breath. EXAM: PORTABLE CHEST 1 VIEW COMPARISON:  01/27/2023. FINDINGS: Cardiac silhouette normal in size.  No mediastinal or masses. Clear lungs.  No pleural effusion or pneumothorax. Skeletal structures grossly intact. IMPRESSION: No active disease. Electronically Signed   By: Amie Portland M.D.   On: 05/03/2023 07:26   CT Head Wo Contrast  Result Date: 05/01/2023 CLINICAL DATA:  Recent fall EXAM: CT HEAD WITHOUT CONTRAST CT MAXILLOFACIAL WITHOUT  CONTRAST TECHNIQUE: Multidetector CT imaging of the head and maxillofacial structures were performed using the standard protocol without  intravenous contrast. Multiplanar CT image reconstructions of the maxillofacial structures were also generated. RADIATION DOSE REDUCTION: This exam was performed according to the departmental dose-optimization program which includes automated exposure control, adjustment of the mA and/or kV according to patient size and/or use of iterative reconstruction technique. COMPARISON:  None Available. FINDINGS: CT HEAD FINDINGS Brain: There is no mass, hemorrhage or extra-axial collection. The size and configuration of the ventricles and extra-axial CSF spaces are normal. The brain parenchyma is normal, without evidence of acute or chronic infarction. Vascular: No hyperdense vessel or unexpected vascular calcification. Skull: The visualized skull base, calvarium and extracranial soft tissues are normal. CT MAXILLOFACIAL FINDINGS Osseous: Minimally displaced nasal bone fractures. Mildly depressed fracture of the right orbital floor and the anterior wall of the right maxillary sinus. Orbits: The globes and optic nerves are intact. Normal extraocular muscles and intraorbital fat. Sinuses: Blood in the right maxillary sinus cavity Soft tissues: Right facial swelling IMPRESSION: 1. No acute intracranial abnormality. 2. Mildly displaced fractures of the right orbital floor and the anterior wall of the right maxillary sinus. 3. Minimally displaced nasal bone fractures. Electronically Signed   By: Deatra Robinson M.D.   On: 05/01/2023 22:22   CT Maxillofacial Wo Contrast  Result Date: 05/01/2023 CLINICAL DATA:  Recent fall EXAM: CT HEAD WITHOUT CONTRAST CT MAXILLOFACIAL WITHOUT CONTRAST TECHNIQUE: Multidetector CT imaging of the head and maxillofacial structures were performed using the standard protocol without intravenous contrast. Multiplanar CT image reconstructions of the maxillofacial structures were also generated. RADIATION DOSE REDUCTION: This exam was performed according to the departmental dose-optimization program  which includes automated exposure control, adjustment of the mA and/or kV according to patient size and/or use of iterative reconstruction technique. COMPARISON:  None Available. FINDINGS: CT HEAD FINDINGS Brain: There is no mass, hemorrhage or extra-axial collection. The size and configuration of the ventricles and extra-axial CSF spaces are normal. The brain parenchyma is normal, without evidence of acute or chronic infarction. Vascular: No hyperdense vessel or unexpected vascular calcification. Skull: The visualized skull base, calvarium and extracranial soft tissues are normal. CT MAXILLOFACIAL FINDINGS Osseous: Minimally displaced nasal bone fractures. Mildly depressed fracture of the right orbital floor and the anterior wall of the right maxillary sinus. Orbits: The globes and optic nerves are intact. Normal extraocular muscles and intraorbital fat. Sinuses: Blood in the right maxillary sinus cavity Soft tissues: Right facial swelling IMPRESSION: 1. No acute intracranial abnormality. 2. Mildly displaced fractures of the right orbital floor and the anterior wall of the right maxillary sinus. 3. Minimally displaced nasal bone fractures. Electronically Signed   By: Deatra Robinson M.D.   On: 05/01/2023 22:22    ZOX:[id number] except as listed in admit H&P  Blood pressure (!) 138/91, pulse 93, temperature 98 F (36.7 C), temperature source Oral, resp. rate 16, height 5\' 10"  (1.778 m), weight 90.9 kg, SpO2 95%.  PHYSICAL EXAM: Overall appearance:  Healthy appearing, in no distress Head:  Normocephalic, atraumatic.  Zygomatic arches are symmetric. Eyes: Minimal right lower lid ecchymosis.  Orbital rims are intact.  No step-off.  EOMs are intact.  No diplopia. Ears: External ears look normal Nose: External nose is symmetric, there are Steri-Strips in place. Internal nasal exam with dried secretions and blood and crusting. Oral Cavity/Pharynx:  There are no mucosal lesions or masses  identified. Larynx/Hypopharynx: Deferred Neuro:  No identifiable neurologic deficits. Neck: No palpable  neck masses.  Studies Reviewed: Maxillofacial CT  Procedures: none   Assessment/Plan: Minimally displaced orbital fracture on the right and nasal fracture.  No surgical treatment necessary.  Avoid nose blowing for 6 weeks.  Follow-up as an outpatient as needed.   S02.30XA     Medical Decision Making: #/Complex Problems: 3  Data Reviewed:3  Management:3 (1-Straightforward, 2-Low, 3-Moderate, 4-High)   Serena Colonel 05/03/2023, 12:51 PM

## 2023-05-03 NOTE — Evaluation (Signed)
Clinical/Bedside Swallow Evaluation Patient Details  Name: Jermaine [name] MRN: [id number] Date of Birth: 11-10-1965  Today's Date: 05/03/2023 Time: SLP Start Time (ACUTE ONLY): 1200 SLP Stop Time (ACUTE ONLY): 1206 SLP Time Calculation (min) (ACUTE ONLY): 6 min  Past Medical History:  Past Medical History:  Diagnosis Date   Alcohol abuse    Anxiety    Class 1 obesity 09/14/2021   Depression    Past Surgical History:  Past Surgical History:  Procedure Laterality Date   HERNIA REPAIR     HPI:  Jermaine [name] is a 57 y.o. male who presented to ED seeking medically supervised detox. He states due his drinking he has had multiple falls, most recent of which was 3 days ago where he sustained facial trauma for which he was seen at [facility].  He was admitted for medical intoxication induced fall, facial trauma and fractures, now in DTs.  CXR 12/6 with no acute findings.  Pt with medical history significant of Anxiety depression, obesity.    Assessment / Plan / Recommendation  Clinical Impression  Pt presents with functional swallowing as assessed clinically.  He tolerated all consistencies trialed including straw sips of thin liquid without any clinical s/s of aspiration.  Pt exhbited good oral clearance of solids.  He reports hx of dysphagia with liquids specifically feeling like they won't go down and sometimes he has to bring them back up.  Based on his description of symptoms, use of relaxation techiniques, and occurence with liquids > solids, suspect esophageal spasm rather than stricture.  He did not experience these symptoms during today's eval.  If symptoms persist, consider further esophageal assessment.  Pt has no further ST needs at this time.  SLP will sign off.  Recommend continuing regular texture diet with thin liquids.   SLP Visit Diagnosis: Dysphagia, unspecified (R13.10)    Aspiration Risk  No limitations    Diet Recommendation Regular;Thin liquid    Liquid Administration via:  Cup;Straw Medication Administration: Whole meds with liquid Supervision: Patient able to self feed Compensations: Slow rate;Small sips/bites Postural Changes: Seated upright at 90 degrees;Remain upright for at least 30 minutes after po intake    Other  Recommendations Recommended Consults: Consider esophageal assessment (if symptoms recurr/persist) Oral Care Recommendations: Oral care BID    Recommendations for follow up therapy are one component of a multi-disciplinary discharge planning process, led by the attending physician.  Recommendations may be updated based on patient status, additional functional criteria and insurance authorization.  Follow up Recommendations No SLP follow up      Assistance Recommended at Discharge  N/A  Functional Status Assessment Patient has not had a recent decline in their functional status  Frequency and Duration  (n/a)          Prognosis Prognosis for improved oropharyngeal function:  (n/a)      Swallow Study   General HPI: Jermaine [name] is a 57 y.o. male who presented to ED seeking medically supervised detox. He states due his drinking he has had multiple falls, most recent of which was 3 days ago where he sustained facial trauma for which he was seen at [facility].  He was admitted for medical intoxication induced fall, facial trauma and fractures, now in DTs.  CXR 12/6 with no acute findings.  Pt with medical history significant of Anxiety depression, obesity. Previous Swallow Assessment: Cinical management Sept 2024 Diet Prior to this Study: Regular;Thin liquids (Level 0) Temperature Spikes Noted: No History of Recent Intubation: No Behavior/Cognition: Alert;Cooperative;Pleasant mood  Oral Cavity Assessment: Within Functional Limits Oral Care Completed by SLP: No Oral Cavity - Dentition: Adequate natural dentition Vision: Functional for self-feeding Self-Feeding Abilities: Able to feed self Patient Positioning: Upright in bed Baseline Vocal  Quality: Normal Volitional Cough: Strong Volitional Swallow: Able to elicit    Oral/Motor/Sensory Function Overall Oral Motor/Sensory Function: Within functional limits Facial ROM: Within Functional Limits Facial Symmetry: Within Functional Limits Lingual ROM: Within Functional Limits Lingual Symmetry: Within Functional Limits Lingual Strength: Within Functional Limits Velum: Within Functional Limits Mandible: Within Functional Limits   Ice Chips Ice chips: Not tested   Thin Liquid Thin Liquid: Within functional limits Presentation: Straw    Nectar Thick Nectar Thick Liquid: Not tested   Honey Thick Honey Thick Liquid: Not tested   Puree Puree: Within functional limits Presentation: Spoon   Solid     Presentation: Self Fed      Kerrie Pleasure, MA, CCC-SLP Acute Rehabilitation Services Office: [phone] 05/03/2023,12:33 PM

## 2023-05-03 NOTE — Evaluation (Signed)
Physical Therapy Evaluation Patient Details Name: Jermaine [name] MRN: [id number] DOB: 1965/09/06 Today's Date: 05/03/2023  History of Present Illness  Pt is a 57 y/o M admitted on 05/01/23 after presenting to the ED seeking medically supervised detox. Pt also reports multiple falls 2/2 drinking; 1 fall resulted in facial trauma. PMH: anxiety, depression, obesity  Clinical Impression  Pt seen for PT evaluation with pt agreeable to tx. Pt reports prior to admission he was independent without AD, driving, in between jobs. On this date, pt is able to complete bed mobility & STS with mod I. Pt ambulates 2 laps around nurses station without AD with CGA with impaired gait pattern as noted below. PT educates pt on benefits of RW to increase balance & reduce fall risk should his balance not improve as he continues to detox. Will continue to follow pt acutely to address high level balance & gait with LRAD.        If plan is discharge home, recommend the following: Assistance with cooking/housework;Assist for transportation   Can travel by private vehicle        Equipment Recommendations Rolling walker (2 wheels)  Recommendations for Other Services       Functional Status Assessment Patient has had a recent decline in their functional status and demonstrates the ability to make significant improvements in function in a reasonable and predictable amount of time.     Precautions / Restrictions Precautions Precautions: Fall Restrictions Weight Bearing Restrictions: No      Mobility  Bed Mobility Overal bed mobility: Modified Independent Bed Mobility: Supine to Sit, Sit to Supine     Supine to sit: HOB elevated, Modified independent (Device/Increase time) Sit to supine: Modified independent (Device/Increase time), HOB elevated        Transfers Overall transfer level: Needs assistance Equipment used: None Transfers: Sit to/from Stand Sit to Stand: Modified independent (Device/Increase  time)           General transfer comment: STS from EOB    Ambulation/Gait Ambulation/Gait assistance: Contact guard assist Gait Distance (Feet): 250 Feet Assistive device: None   Gait velocity: decreased     General Gait Details: decreased BUE reciprocal arm swing, decreased heel strike BLE, ataxia BLE, guarded gait  Stairs            Wheelchair Mobility     Tilt Bed    Modified Rankin (Stroke Patients Only)       Balance Overall balance assessment: Needs assistance Sitting-balance support: Feet supported Sitting balance-Leahy Scale: Good     Standing balance support: During functional activity, No upper extremity supported Standing balance-Leahy Scale: Fair                               Pertinent Vitals/Pain Pain Assessment Pain Assessment: Faces Faces Pain Scale: Hurts a little bit Pain Location: nose Pain Descriptors / Indicators: Discomfort Pain Intervention(s): Monitored during session    Home Living Family/patient expects to be discharged to:: Private residence Living Arrangements: Alone Available Help at Discharge: Friend(s);Available PRN/intermittently Type of Home: Apartment Home Access: Level entry       Home Layout: One level Home Equipment: None      Prior Function Prior Level of Function : Independent/Modified Independent;Driving             Mobility Comments: "in between jobs", reports no falls unrelated to drinking, all recent falls related to alcohol use  Extremity/Trunk Assessment   Upper Extremity Assessment Upper Extremity Assessment: Overall WFL for tasks assessed    Lower Extremity Assessment Lower Extremity Assessment: Overall WFL for tasks assessed (BLE ataxia during gait)       Communication   Communication Communication: No apparent difficulties  Cognition Arousal: Alert Behavior During Therapy: WFL for tasks assessed/performed Overall Cognitive Status: Within Functional Limits for  tasks assessed                                          General Comments      Exercises Other Exercises Other Exercises: Pt performed 10x STS from EOB without BUE support with supervision with cuing to not lean posteriorly on EOB with BLE; activity focused on strengthening & endurance training.   Assessment/Plan    PT Assessment Patient needs continued PT services  PT Problem List Decreased coordination;Decreased balance;Decreased mobility;Decreased knowledge of use of DME       PT Treatment Interventions DME instruction;Balance training;Gait training;Neuromuscular re-education;Stair training;Functional mobility training;Therapeutic activities;Therapeutic exercise;Patient/family education    PT Goals (Current goals can be found in the Care Plan section)  Acute Rehab PT Goals Patient Stated Goal: go home PT Goal Formulation: With patient Time For Goal Achievement: 05/17/23 Potential to Achieve Goals: Good    Frequency Min 1X/week     Co-evaluation               AM-PAC PT "6 Clicks" Mobility  Outcome Measure Help needed turning from your back to your side while in a flat bed without using bedrails?: None Help needed moving from lying on your back to sitting on the side of a flat bed without using bedrails?: None Help needed moving to and from a bed to a chair (including a wheelchair)?: A Little Help needed standing up from a chair using your arms (e.g., wheelchair or bedside chair)?: None Help needed to walk in hospital room?: A Little Help needed climbing 3-5 steps with a railing? : A Little 6 Click Score: 21    End of Session   Activity Tolerance: Patient tolerated treatment well Patient left: in bed;with call bell/phone within reach;with bed alarm set   PT Visit Diagnosis: Unsteadiness on feet (R26.81);Other abnormalities of gait and mobility (R26.89)    Time: 9147-8295 PT Time Calculation (min) (ACUTE ONLY): 13 min   Charges:   PT  Evaluation $PT Eval Low Complexity: 1 Low   PT General Charges $$ ACUTE PT VISIT: 1 Visit         Aleda Grana, PT, DPT 05/03/23, 10:37 AM   Sandi Mariscal 05/03/2023, 10:36 AM

## 2023-05-03 NOTE — Progress Notes (Signed)
AMA  Patient at this time expresses desire to leave the Hospital immidiately, patient has been warned that this is not Medically advisable at this time, and can result in Medical complications like Death and Disability, patient understands and accepts the risks involved and assumes full responsibilty of this decision.   Susa Raring M.D on 05/03/2023 at 4:23 PM  [facility]  Time < 30 minutes  Last Note Below                                                                      PROGRESS NOTE                                                                                                                                                                                                             Patient Demographics:    Jermaine [name], is a 57 y.o. male, DOB - 04-29-1966, [id number]  Outpatient Primary MD for the patient is Farris Has, MD    LOS - 1  Admit date - 05/01/2023    Chief Complaint  Patient presents with   Alcohol Problem       Brief Narrative (HPI from H&P)     57 y.o. male with medical history significant of Anxiety depression, obesity. Patient presents to ED seeking medically supervised detox. He states due his drinking he has had multiple falls, most recent of which was 3 days ago where he sustained facial trauma for which he was seen at [facility].  He states he is unable to quit on his own and his last drink was 2 hours PTA. Patient notes he typically has severe withdrawal. Per chart patient has severe withdrawal requiring icu sedation and intubation.  He was admitted for medical intoxication induced fall, facial trauma and fractures, now in DTs.   Subjective:    Jermaine [name] today has, No headache, No chest pain, No abdominal pain - No Nausea, No new weakness tingling or numbness, no shortness  of breath, no problems with  vision or hearing, eager to go home.   Assessment  & Plan :    Fall due to alcohol intoxication thereafter in DTs.  Ongoing alcohol abuse. He has been extensively counseled to quit alcohol, and severe DTs currently on phenobarb taper and CIWA protocol, continue to monitor.  Facial fracture due to fall [facility]) - pt able to move his eyes in all directions. No ocular muscle entrapment.  Stable vision, no headache, will inform trauma team as well to review the chart.  Class 1 obesity - BMI of 28.75.  Follow-up with PCP.       Condition -  Guarded  Family Communication  : None present  Code Status :  Full  Consults  : ENT/ facial trauma to see the patient, surgical trauma team reviewed the chart, do not need to get involved per surgical trauma team.  PUD Prophylaxis :    Procedures  :     CT head, maxillofacial- 1. No acute intracranial abnormality. 2. Mildly displaced fractures of the right orbital floor and the anterior wall of the right maxillary sinus. 3. Minimally displaced nasal bone fractures.      Disposition Plan  :    Status is: Inpatient   DVT Prophylaxis  :    Place and maintain sequential compression device Start: 05/02/23 1520   Lab Results  Component Value Date   PLT 248 05/01/2023    Diet :  Diet Order             Diet regular Room service appropriate? Yes; Fluid consistency: Thin  Diet effective now                    Inpatient Medications  Scheduled Meds:  escitalopram  20 mg Oral Daily   folic acid  1 mg Oral Daily   multivitamin with minerals  1 tablet Oral Daily   phenobarbital  97.2 mg Oral TID   Followed by   phenobarbital  64.8 mg Oral TID   Followed by   Melene Muller ON 05/04/2023] phenobarbital  32.4 mg Oral TID   Followed by   Melene Muller ON 05/05/2023] phenobarbital  16.2 mg Oral TID   thiamine  100 mg Oral Daily   Continuous Infusions: PRN Meds:.acetaminophen, LORazepam **OR** LORazepam, melatonin, ondansetron (ZOFRAN) IV, sodium  chloride  Antibiotics  :    Anti-infectives (From admission, onward)    None         Objective:   Vitals:   05/02/23 2340 05/03/23 0433 05/03/23 0800 05/03/23 1100  BP:  124/82 (!) 138/91 (!) 150/88  Pulse: 93 76 93 (!) 105  Resp: (!) 21 17 16    Temp:  97.7 F (36.5 C) 98 F (36.7 C)   TempSrc:  Axillary Oral   SpO2: 92% 99% 95%   Weight:      Height:        Wt Readings from Last 3 Encounters:  05/02/23 90.9 kg  01/28/23 86.5 kg  05/12/22 88.1 kg     Intake/Output Summary (Last 24 hours) at 05/03/2023 1623 Last data filed at 05/03/2023 0700 Gross per 24 hour  Intake 580 ml  Output 400 ml  Net 180 ml     Physical Exam  Awake Alert, No new F.N deficits, Normal affect [place].swollen nose with multiple Steri-Strips, good ocular movement, no diplopia or headache this morning Supple Neck, No JVD,   Symmetrical Chest wall movement, Good air movement bilaterally, CTAB RRR,No Gallops,Rubs or  new Murmurs,  +ve B.Sounds, Abd Soft, No tenderness,   No Cyanosis, Clubbing or edema       Data Review:    Recent Labs  Lab 05/01/23 2005  WBC 9.9  HGB 16.4  HCT 48.0  PLT 248  MCV 94.5  MCH 32.3  MCHC 34.2  RDW 12.9  LYMPHSABS 2.6  MONOABS 1.0  EOSABS 0.0  BASOSABS 0.1    Recent Labs  Lab 05/01/23 2005  NA 141  K 3.9  CL 103  CO2 26  ANIONGAP 12  GLUCOSE 128*  BUN 18  CREATININE 1.13  AST 46*  ALT 40  ALKPHOS 66  BILITOT 0.6  ALBUMIN 4.0  MG 2.5*  CALCIUM 8.3*      Recent Labs  Lab 05/01/23 2005  MG 2.5*  CALCIUM 8.3*    --------------------------------------------------------------------------------------------------------------- Lab Results  Component Value Date   TRIG 126 01/23/2023    Lab Results  Component Value Date   HGBA1C 5.4 01/16/2023   No results for input(s): "TSH", "T4TOTAL", "FREET4", "T3FREE", "THYROIDAB" in the last 72 hours. No results for input(s): "VITAMINB12", "FOLATE", "FERRITIN", "TIBC", "IRON",  "RETICCTPCT" in the last 72 hours. ------------------------------------------------------------------------------------------------------------------ Cardiac Enzymes No results for input(s): "CKMB", "TROPONINI", "MYOGLOBIN" in the last 168 hours.  Invalid input(s): "CK"  Micro Results No results found for this or any previous visit (from the past 240 hour(s)).  Radiology Reports DG Chest Port 1 View  Result Date: 05/03/2023 CLINICAL DATA:  Short of breath. EXAM: PORTABLE CHEST 1 VIEW COMPARISON:  01/27/2023. FINDINGS: Cardiac silhouette normal in size.  No mediastinal or masses. Clear lungs.  No pleural effusion or pneumothorax. Skeletal structures grossly intact. IMPRESSION: No active disease. Electronically Signed   By: Amie Portland M.D.   On: 05/03/2023 07:26   CT Head Wo Contrast  Result Date: 05/01/2023 CLINICAL DATA:  Recent fall EXAM: CT HEAD WITHOUT CONTRAST CT MAXILLOFACIAL WITHOUT CONTRAST TECHNIQUE: Multidetector CT imaging of the head and maxillofacial structures were performed using the standard protocol without intravenous contrast. Multiplanar CT image reconstructions of the maxillofacial structures were also generated. RADIATION DOSE REDUCTION: This exam was performed according to the departmental dose-optimization program which includes automated exposure control, adjustment of the mA and/or kV according to patient size and/or use of iterative reconstruction technique. COMPARISON:  None Available. FINDINGS: CT HEAD FINDINGS Brain: There is no mass, hemorrhage or extra-axial collection. The size and configuration of the ventricles and extra-axial CSF spaces are normal. The brain parenchyma is normal, without evidence of acute or chronic infarction. Vascular: No hyperdense vessel or unexpected vascular calcification. Skull: The visualized skull base, calvarium and extracranial soft tissues are normal. CT MAXILLOFACIAL FINDINGS Osseous: Minimally displaced nasal bone fractures. Mildly  depressed fracture of the right orbital floor and the anterior wall of the right maxillary sinus. Orbits: The globes and optic nerves are intact. Normal extraocular muscles and intraorbital fat. Sinuses: Blood in the right maxillary sinus cavity Soft tissues: Right facial swelling IMPRESSION: 1. No acute intracranial abnormality. 2. Mildly displaced fractures of the right orbital floor and the anterior wall of the right maxillary sinus. 3. Minimally displaced nasal bone fractures. Electronically Signed   By: Deatra Robinson M.D.   On: 05/01/2023 22:22   CT Maxillofacial Wo Contrast  Result Date: 05/01/2023 CLINICAL DATA:  Recent fall EXAM: CT HEAD WITHOUT CONTRAST CT MAXILLOFACIAL WITHOUT CONTRAST TECHNIQUE: Multidetector CT imaging of the head and maxillofacial structures were performed using the standard protocol without intravenous contrast. Multiplanar CT image reconstructions of  the maxillofacial structures were also generated. RADIATION DOSE REDUCTION: This exam was performed according to the departmental dose-optimization program which includes automated exposure control, adjustment of the mA and/or kV according to patient size and/or use of iterative reconstruction technique. COMPARISON:  None Available. FINDINGS: CT HEAD FINDINGS Brain: There is no mass, hemorrhage or extra-axial collection. The size and configuration of the ventricles and extra-axial CSF spaces are normal. The brain parenchyma is normal, without evidence of acute or chronic infarction. Vascular: No hyperdense vessel or unexpected vascular calcification. Skull: The visualized skull base, calvarium and extracranial soft tissues are normal. CT MAXILLOFACIAL FINDINGS Osseous: Minimally displaced nasal bone fractures. Mildly depressed fracture of the right orbital floor and the anterior wall of the right maxillary sinus. Orbits: The globes and optic nerves are intact. Normal extraocular muscles and intraorbital fat. Sinuses: Blood in the  right maxillary sinus cavity Soft tissues: Right facial swelling IMPRESSION: 1. No acute intracranial abnormality. 2. Mildly displaced fractures of the right orbital floor and the anterior wall of the right maxillary sinus. 3. Minimally displaced nasal bone fractures. Electronically Signed   By: Deatra Robinson M.D.   On: 05/01/2023 22:22      Signature  -   Susa Raring M.D on 05/03/2023 at 4:23 PM   -  To page go to [email or web address]

## 2023-05-03 NOTE — Progress Notes (Signed)
Mobility Specialist Progress Note;   05/03/23 1340  Mobility  Activity Ambulated with assistance in hallway  Level of Assistance Contact guard assist, steadying assist  Assistive Device Front wheel walker  Distance Ambulated (ft) 545 ft  Activity Response Tolerated well  Mobility Referral Yes  Mobility visit 1 Mobility  Mobility Specialist Start Time (ACUTE ONLY) 1340  Mobility Specialist Stop Time (ACUTE ONLY) 1400  Mobility Specialist Time Calculation (min) (ACUTE ONLY) 20 min   Pt eager for mobility. Required MinG assistance during ambulation for safety. Started ambulation w/ RW, however after ~154ft pt stated he didn't like it, doesn't use one at baseline, and felt stable enough to walk without it. Pt able to ambulate safely w/o AD remainder of session. VSS and no c/o during session. Pt returned back to bed with all needs met, eager for more mobility later on.   Jermaine [name] Mobility Specialist Please contact via SecureChat or Rehab Office [phone]

## 2023-05-03 NOTE — ED Triage Notes (Signed)
Pt BIB EMS with reports of ETOH. PT wants detox. Last drink was when EMS pulled up on the scene. Pt was drinking Titos.

## 2023-05-03 NOTE — Progress Notes (Addendum)
Patient sister called requesting information of reason of admission; notified patient was awake and able to communicate with her.  Notified of HIPAA law and that I could not provide that information without consent.  Verbalized understanding. Notified patient cell phone is now charged and he called her at [phone].

## 2023-05-03 NOTE — ED Notes (Signed)
PT refuse to stay in seat. PT able to walk with no assist

## 2023-05-03 NOTE — Progress Notes (Signed)
Pt met staff at front desk and was dressed in street clothes. Pt said he was leaving. This nurse explained that this would be deemed leaving against medical advice, pt stated understanding. Charge nurse aware, and MD P. Thedore Mins made aware. Staff called telemetry to ask if they knew pt was off of telemetry, responder stated, "I see he is off now. Is he going somewhere?" This nurse explained that the patient is leaving AMA.

## 2023-05-03 NOTE — Progress Notes (Signed)
PROGRESS NOTE                                                                                                                                                                                                             Patient Demographics:    Jermaine [name], is a 57 y.o. male, DOB - Aug 29, 1965, OZH:[id number]  Outpatient Primary MD for the patient is Farris Has, MD    LOS - 1  Admit date - 05/01/2023    Chief Complaint  Patient presents with   Alcohol Problem       Brief Narrative (HPI from H&P)     57 y.o. male with medical history significant of Anxiety depression, obesity. Patient presents to ED seeking medically supervised detox. He states due his drinking he has had multiple falls, most recent of which was 3 days ago where he sustained facial trauma for which he was seen at [facility].  He states he is unable to quit on his own and his last drink was 2 hours PTA. Patient notes he typically has severe withdrawal. Per chart patient has severe withdrawal requiring icu sedation and intubation.  He was admitted for medical intoxication induced fall, facial trauma and fractures, now in DTs.   Subjective:    Kongpheng Esperanza today has, No headache, No chest pain, No abdominal pain - No Nausea, No new weakness tingling or numbness, no shortness of breath, no problems with vision or hearing, eager to go home.   Assessment  & Plan :    Fall due to alcohol intoxication thereafter in DTs.  Ongoing alcohol abuse. He has been extensively counseled to quit alcohol, and severe DTs currently on phenobarb taper and CIWA protocol, continue to monitor.  Facial fracture due to fall [facility]) - pt able to move his eyes in all directions. No ocular muscle entrapment.  Stable vision, no headache, will inform trauma team as well to review the chart.  Class 1 obesity - BMI of 28.75.  Follow-up with PCP.       Condition -  Guarded  Family Communication   : None present  Code Status :  Full  Consults  : ENT/ facial trauma to see the patient, surgical trauma team reviewed the chart, do not need to get involved per surgical trauma team.  PUD Prophylaxis :    Procedures  :  CT head, maxillofacial- 1. No acute intracranial abnormality. 2. Mildly displaced fractures of the right orbital floor and the anterior wall of the right maxillary sinus. 3. Minimally displaced nasal bone fractures.      Disposition Plan  :    Status is: Inpatient   DVT Prophylaxis  :    Place and maintain sequential compression device Start: 05/02/23 1520   Lab Results  Component Value Date   PLT 248 05/01/2023    Diet :  Diet Order             Diet regular Room service appropriate? Yes; Fluid consistency: Thin  Diet effective now                    Inpatient Medications  Scheduled Meds:  escitalopram  20 mg Oral Daily   folic acid  1 mg Oral Daily   multivitamin with minerals  1 tablet Oral Daily   phenobarbital  97.2 mg Oral TID   Followed by   phenobarbital  64.8 mg Oral TID   Followed by   Melene Muller ON 05/04/2023] phenobarbital  32.4 mg Oral TID   Followed by   Melene Muller ON 05/05/2023] phenobarbital  16.2 mg Oral TID   thiamine  100 mg Oral Daily   Continuous Infusions: PRN Meds:.acetaminophen, LORazepam **OR** LORazepam, melatonin, ondansetron (ZOFRAN) IV  Antibiotics  :    Anti-infectives (From admission, onward)    None         Objective:   Vitals:   05/02/23 2335 05/02/23 2340 05/03/23 0433 05/03/23 0800  BP:   124/82 (!) 138/91  Pulse: 88 93 76 93  Resp: 20 (!) 21 17 16   Temp:   97.7 F (36.5 C) 98 F (36.7 C)  TempSrc:   Axillary Oral  SpO2: 92% 92% 99% 95%  Weight:      Height:        Wt Readings from Last 3 Encounters:  05/02/23 90.9 kg  01/28/23 86.5 kg  05/12/22 88.1 kg     Intake/Output Summary (Last 24 hours) at 05/03/2023 1117 Last data filed at 05/03/2023 0700 Gross per 24 hour  Intake 580 ml   Output 400 ml  Net 180 ml     Physical Exam  Awake Alert, No new F.N deficits, Normal affect [place].swollen nose with multiple Steri-Strips, good ocular movement, no diplopia or headache this morning Supple Neck, No JVD,   Symmetrical Chest wall movement, Good air movement bilaterally, CTAB RRR,No Gallops,Rubs or new Murmurs,  +ve B.Sounds, Abd Soft, No tenderness,   No Cyanosis, Clubbing or edema       Data Review:    Recent Labs  Lab 05/01/23 2005  WBC 9.9  HGB 16.4  HCT 48.0  PLT 248  MCV 94.5  MCH 32.3  MCHC 34.2  RDW 12.9  LYMPHSABS 2.6  MONOABS 1.0  EOSABS 0.0  BASOSABS 0.1    Recent Labs  Lab 05/01/23 2005  NA 141  K 3.9  CL 103  CO2 26  ANIONGAP 12  GLUCOSE 128*  BUN 18  CREATININE 1.13  AST 46*  ALT 40  ALKPHOS 66  BILITOT 0.6  ALBUMIN 4.0  MG 2.5*  CALCIUM 8.3*      Recent Labs  Lab 05/01/23 2005  MG 2.5*  CALCIUM 8.3*    --------------------------------------------------------------------------------------------------------------- Lab Results  Component Value Date   TRIG 126 01/23/2023    Lab Results  Component Value Date   HGBA1C 5.4 01/16/2023   No  results for input(s): "TSH", "T4TOTAL", "FREET4", "T3FREE", "THYROIDAB" in the last 72 hours. No results for input(s): "VITAMINB12", "FOLATE", "FERRITIN", "TIBC", "IRON", "RETICCTPCT" in the last 72 hours. ------------------------------------------------------------------------------------------------------------------ Cardiac Enzymes No results for input(s): "CKMB", "TROPONINI", "MYOGLOBIN" in the last 168 hours.  Invalid input(s): "CK"  Micro Results No results found for this or any previous visit (from the past 240 hour(s)).  Radiology Reports DG Chest Port 1 View  Result Date: 05/03/2023 CLINICAL DATA:  Short of breath. EXAM: PORTABLE CHEST 1 VIEW COMPARISON:  01/27/2023. FINDINGS: Cardiac silhouette normal in size.  No mediastinal or masses. Clear lungs.  No pleural  effusion or pneumothorax. Skeletal structures grossly intact. IMPRESSION: No active disease. Electronically Signed   By: Amie Portland M.D.   On: 05/03/2023 07:26   CT Head Wo Contrast  Result Date: 05/01/2023 CLINICAL DATA:  Recent fall EXAM: CT HEAD WITHOUT CONTRAST CT MAXILLOFACIAL WITHOUT CONTRAST TECHNIQUE: Multidetector CT imaging of the head and maxillofacial structures were performed using the standard protocol without intravenous contrast. Multiplanar CT image reconstructions of the maxillofacial structures were also generated. RADIATION DOSE REDUCTION: This exam was performed according to the departmental dose-optimization program which includes automated exposure control, adjustment of the mA and/or kV according to patient size and/or use of iterative reconstruction technique. COMPARISON:  None Available. FINDINGS: CT HEAD FINDINGS Brain: There is no mass, hemorrhage or extra-axial collection. The size and configuration of the ventricles and extra-axial CSF spaces are normal. The brain parenchyma is normal, without evidence of acute or chronic infarction. Vascular: No hyperdense vessel or unexpected vascular calcification. Skull: The visualized skull base, calvarium and extracranial soft tissues are normal. CT MAXILLOFACIAL FINDINGS Osseous: Minimally displaced nasal bone fractures. Mildly depressed fracture of the right orbital floor and the anterior wall of the right maxillary sinus. Orbits: The globes and optic nerves are intact. Normal extraocular muscles and intraorbital fat. Sinuses: Blood in the right maxillary sinus cavity Soft tissues: Right facial swelling IMPRESSION: 1. No acute intracranial abnormality. 2. Mildly displaced fractures of the right orbital floor and the anterior wall of the right maxillary sinus. 3. Minimally displaced nasal bone fractures. Electronically Signed   By: Deatra Robinson M.D.   On: 05/01/2023 22:22   CT Maxillofacial Wo Contrast  Result Date:  05/01/2023 CLINICAL DATA:  Recent fall EXAM: CT HEAD WITHOUT CONTRAST CT MAXILLOFACIAL WITHOUT CONTRAST TECHNIQUE: Multidetector CT imaging of the head and maxillofacial structures were performed using the standard protocol without intravenous contrast. Multiplanar CT image reconstructions of the maxillofacial structures were also generated. RADIATION DOSE REDUCTION: This exam was performed according to the departmental dose-optimization program which includes automated exposure control, adjustment of the mA and/or kV according to patient size and/or use of iterative reconstruction technique. COMPARISON:  None Available. FINDINGS: CT HEAD FINDINGS Brain: There is no mass, hemorrhage or extra-axial collection. The size and configuration of the ventricles and extra-axial CSF spaces are normal. The brain parenchyma is normal, without evidence of acute or chronic infarction. Vascular: No hyperdense vessel or unexpected vascular calcification. Skull: The visualized skull base, calvarium and extracranial soft tissues are normal. CT MAXILLOFACIAL FINDINGS Osseous: Minimally displaced nasal bone fractures. Mildly depressed fracture of the right orbital floor and the anterior wall of the right maxillary sinus. Orbits: The globes and optic nerves are intact. Normal extraocular muscles and intraorbital fat. Sinuses: Blood in the right maxillary sinus cavity Soft tissues: Right facial swelling IMPRESSION: 1. No acute intracranial abnormality. 2. Mildly displaced fractures of the right orbital floor and the  anterior wall of the right maxillary sinus. 3. Minimally displaced nasal bone fractures. Electronically Signed   By: Deatra Robinson M.D.   On: 05/01/2023 22:22      Signature  -   Susa Raring M.D on 05/03/2023 at 11:17 AM   -  To page go to [email or web address]

## 2023-05-04 ENCOUNTER — Encounter (HOSPITAL_COMMUNITY): Payer: Self-pay

## 2023-05-04 ENCOUNTER — Emergency Department (HOSPITAL_COMMUNITY)
Admission: EM | Admit: 2023-05-04 | Discharge: 2023-05-04 | Disposition: A | Discharge status: Home / Self Care | Payer: BLUE CROSS/BLUE SHIELD | Attending: Emergency Medicine | Admitting: Emergency Medicine

## 2023-05-04 ENCOUNTER — Other Ambulatory Visit: Payer: Self-pay

## 2023-05-04 DIAGNOSIS — R000 Tachycardia, unspecified: Secondary | ICD-10-CM | POA: Insufficient documentation

## 2023-05-04 DIAGNOSIS — F10930 Alcohol use, unspecified with withdrawal, uncomplicated: Secondary | ICD-10-CM | POA: Insufficient documentation

## 2023-05-04 LAB — CBC WITH DIFFERENTIAL/PLATELET
Abs Immature Granulocytes: 0.06 10*3/uL (ref 0.00–0.07)
Basophils Absolute: 0 10*3/uL (ref 0.0–0.1)
Basophils Relative: 1 %
Eosinophils Absolute: 0.1 10*3/uL (ref 0.0–0.5)
Eosinophils Relative: 2 %
HCT: 41.4 % (ref 39.0–52.0)
Hemoglobin: 14.4 g/dL (ref 13.0–17.0)
Immature Granulocytes: 1 %
Lymphocytes Relative: 19 %
Lymphs Abs: 1.5 10*3/uL (ref 0.7–4.0)
MCH: 33.6 pg (ref 26.0–34.0)
MCHC: 34.8 g/dL (ref 30.0–36.0)
MCV: 96.7 fL (ref 80.0–100.0)
Monocytes Absolute: 0.6 10*3/uL (ref 0.1–1.0)
Monocytes Relative: 8 %
Neutro Abs: 5.3 10*3/uL (ref 1.7–7.7)
Neutrophils Relative %: 69 %
Platelets: 168 10*3/uL (ref 150–400)
RBC: 4.28 MIL/uL (ref 4.22–5.81)
RDW: 12.9 % (ref 11.5–15.5)
WBC: 7.6 10*3/uL (ref 4.0–10.5)
nRBC: 0 % (ref 0.0–0.2)

## 2023-05-04 LAB — COMPREHENSIVE METABOLIC PANEL
ALT: 32 U/L (ref 0–44)
AST: 47 U/L — ABNORMAL HIGH (ref 15–41)
Albumin: 3.9 g/dL (ref 3.5–5.0)
Alkaline Phosphatase: 50 U/L (ref 38–126)
Anion gap: 14 (ref 5–15)
BUN: 18 mg/dL (ref 6–20)
CO2: 22 mmol/L (ref 22–32)
Calcium: 8.4 mg/dL — ABNORMAL LOW (ref 8.9–10.3)
Chloride: 103 mmol/L (ref 98–111)
Creatinine, Ser: 0.81 mg/dL (ref 0.61–1.24)
GFR, Estimated: >60 mL/min (ref >60)
Glucose, Bld: 75 mg/dL (ref 70–99)
Potassium: 3.7 mmol/L (ref 3.5–5.1)
Sodium: 139 mmol/L (ref 135–145)
Total Bilirubin: 0.8 mg/dL (ref <1.2)
Total Protein: 6.6 g/dL (ref 6.5–8.1)

## 2023-05-04 LAB — ETHANOL: Alcohol, Ethyl (B): <10 mg/dL (ref <10)

## 2023-05-04 MED ORDER — THIAMINE MONONITRATE 100 MG PO TABS
100.0000 mg | ORAL_TABLET | Freq: Every day | ORAL | Status: DC
  Administered 2023-05-04: 100 mg via ORAL
  Filled 2023-05-04: qty 1

## 2023-05-04 MED ORDER — CHLORDIAZEPOXIDE HCL 25 MG PO CAPS
50.0000 mg | ORAL_CAPSULE | Freq: Once | ORAL | Status: AC
  Administered 2023-05-04: 50 mg via ORAL
  Filled 2023-05-04: qty 2

## 2023-05-04 MED ORDER — CHLORDIAZEPOXIDE HCL 25 MG PO CAPS: ORAL_CAPSULE | ORAL | 0 refills | Status: DC

## 2023-05-04 MED ORDER — LORAZEPAM 1 MG PO TABS: 0.0000 mg | ORAL_TABLET | Freq: Two times a day (BID) | ORAL | Status: DC

## 2023-05-04 MED ORDER — THIAMINE HCL 100 MG/ML IJ SOLN
100.0000 mg | Freq: Every day | INTRAMUSCULAR | Status: DC
Start: 2023-05-04 — End: 2023-05-04
  Filled 2023-05-04: qty 2

## 2023-05-04 MED ORDER — LORAZEPAM 2 MG/ML IJ SOLN
0.0000 mg | Freq: Four times a day (QID) | INTRAMUSCULAR | Status: DC
  Administered 2023-05-04: 3 mg via INTRAVENOUS
  Filled 2023-05-04: qty 2

## 2023-05-04 MED ORDER — LORAZEPAM 1 MG PO TABS: 0.0000 mg | ORAL_TABLET | Freq: Four times a day (QID) | ORAL | Status: DC

## 2023-05-04 MED ORDER — LORAZEPAM 2 MG/ML IJ SOLN: 0.0000 mg | Freq: Two times a day (BID) | INTRAMUSCULAR | Status: DC

## 2023-05-04 NOTE — Discharge Instructions (Addendum)
You were seen in the emergency room for alcohol withdrawal related symptoms.  As discussed, currently you do not have any complications meriting admission to the hospital for detox.  However, your symptoms could get worse and if you start having hallucinations, feeling that something is crawling on you, confusion then you need to call 911.  Read the instructions provided on alcohol withdrawal.  Start taking the medications that are prescribed.  Make sure that you call your family members, and they are checking up on you.  Below are additional resources provided for substance use disorder.  You might want to contact them to see if they can help you with detox.  Substance Abuse Treatment Programs  Intensive Outpatient Programs [facility]     [address]      [place], [place]                   [phone]       [facility] [address] [place] [address] [place], [place] [phone]  [name]     (Inpatient and outpatient)     [address]           [phone]    [facility] [phone] (Suboxone and Methadone)  [address]      [place], [place] [address]      [phone]       [address] [place], [place] [phone]  [facility] (Outpatient/Inpatient, Chemical)    (insurance only) [phone]             [facility] (Groups & Residential) [place], [place] [phone]     [facility]     [address]     [place], [place]      [phone]       [facility] (for caregivers and family) [phone] [address] Laurell Joseph[address] [place], [place] [phone]      Residential Treatment Programs [facility]      [address], [place], [place] [address]  [phone]       [facility] [address] [place], [place] [address] [phone]  [facility] [place]        [phone]       [facility] [phone]  [facility] ([facility]              [address]                                         [place], [place]                                                [phone] or [phone]                               [facility] [place] [address] [place], [address] [phone]  [facility]    [address]      [place], [place]     [phone]       [facility] [address] [place], [place] [phone]  [facility]   [address] [place]  [place], [place] [address]     [phone]      Admissions: 8am-3pm M-F  [facility] ([facility]) [address] [place], [place] [phone]  [facility]: Residential Program ([address]   [place], [place]      [phone] or [phone]     [facility]: [facility] [place], [place] (Walk in Hours over the weekend or by referral)  [facility] [address] [place], [place], [place] [address] [phone]  Crisis Mobile: [facility]:  [phone] (for crisis response 24 hours a day) [facility] Hotline:      [phone] Outpatient Psychiatry and Counseling  [facility]: Mobile Crisis Management 24 hours:  [phone]  [facility] [employer] sliding scale fee and walk in schedule: M-F 8am-12pm/1pm-3pm [address]  [place], [place] [address] [phone]  [facility] [address] [place], [place] [address] [phone]  [facility] (Formerly [facility] as [facility] [employer])- new patient walk-in appointments available Monday - Friday 8am -3pm.          [address] [place], [place] [address] [phone] or crisis line- [phone]  [facility] Out[facility] Services/ Intensive Outpatient Therapy Program [address] [place], [place] [address] [phone]  [facility]                  Crisis Services      [phone] [address]     [place], [place] [address]                 [facility]   [facility] [phone]. [address] [place], [place] [address]   [facility]          [address] [place]  [place], [place] [address]       [phone]  [facility] [address] [place], [place] [address] [phone]  [facility]    [address]    [place], [place] [address]     [phone]       Andee Poles, MD     [address]     [place] [address]     [phone]       [facility] [address] [place] [address]  [facility]     [address] [place], [place]      [phone]       [facility] Eulogio Ditch, MD [address] [place], [place] [address] [phone]  [facility]     [address]     [place], [place] [address]     [phone]       [facility] [address] [place], [place] [address] [phone] Resources for Temporary Residential Assistance/Crisis Centers  [facility]) M-F 8am-3pm   [address] [place], [place] [address]   [phone] Services include: laundry, barbering, support groups, case management, phone  & computer access, showers, AA/NA mtgs, mental health/substance abuse nurse, job skills class, disability information, VA assistance, spiritual classes, etc.   HOMELESS SHELTERS  [facility]     [facility]   [address], [place]     [phone]              [employer] (  women and children)       [address] [place], [place] [address] [phone] [email or web address] for application and process Application Required  [facility]   [address]    [place] [address]     [phone]                     [facility] [place] [address] [place], [place] [address] [phone] application appt.) Application Required  [facility] (women only)    [address]     [place], [place] [address]     [phone]      Intake starts 6pm daily Need valid ID, SSC, & Police report [employer] [address] [place], [place] [phone] Application Required  [employer] (men only)     [address]      [place], [place]     [phone]       [facility] (Pregnant women only) [address] [place], [place] [phone]  [facility]      [address]      [place], [place] [address]     [phone]             [facility] [address] [place], [place] [phone] 90 day commitment/SA/Application process  [facility](men only)     [address]     [place], [place]     [phone]       Check-in at [facility] [address] [place], [place] [address] [phone] Men/Women/Women and Children must be there by 7 pm  [facility] [place], [place] [phone]

## 2023-05-04 NOTE — ED Triage Notes (Signed)
Patient reports he is going through alcohol withdrawal. Patient reports he drinks 2/5ths of vodka daily. Patient's last drink was 0300 this AM. Reports nausea, vomiting, tremors, and headache. Has a hx of seizures with withdrawal. CIWA 21 on arrival.

## 2023-05-04 NOTE — ED Notes (Signed)
Pt left. 

## 2023-05-04 NOTE — ED Notes (Addendum)
Pt called the cab and states he wants to go home and continue drinking I tried convincing him to stay because  his BP is bad. He refused

## 2023-05-04 NOTE — ED Provider Notes (Signed)
[place] [facility]ERGENCY DE[facility] AT [facility] Provider Note   CSN: [id number] Arrival date & time: 05/04/23  1005     History  Chief Complaint  Patient presents with   Alcohol Problem    Jermaine [name] is a 57 y.o. male.  HPI    57 y.o. male with medical history significant of  Anxiety depression, obesity, alcoholism comes in with chief complaint of detox.  Patient indicates that normally when he detox, he does not under medical supervision because of his previous history of seizures.  Earlier this year he required intubation.  Patient states that he relapsed a few days back.  He came to the ER on 12-4 and was admitted for detox.  He left AMA yesterday around 4:30 PM.  He thereafter came back to the ER early in the morning but again left prior to assessment.  He states that he went home and drank alcohol again, last alcoholic beverage was around 3 AM.  He came to the ER again seeking detox.  Patient was directly asked if he thinks he is ready to quit drinking, to which he responded " I do not know".  Patient states that he has sponsors and he goes to [employer] meeting at 8 AM every morning.  No specific trigger for this relapse.  Currently patient feels jittery, anxious and has some pain.  Home Medications Prior to Admission medications   Medication Sig Start Date End Date Taking? Authorizing Provider  clindamycin (CLEOCIN) 300 MG capsule Take 300 mg by mouth 3 (three) times daily. For 10 days Patient not taking: Reported on 05/02/2023 04/30/23 05/10/23  [provider]  escitalopram (LEXAPRO) 20 MG tablet Take 20 mg by mouth daily. 02/27/19   [provider]  folic acid (FOLVITE) 1 MG tablet Take 1 tablet (1 mg total) by mouth daily. 01/28/23   Danford, Earl Lites, MD  hydrOXYzine (ATARAX) 25 MG tablet Take 1 tablet (25 mg total) by mouth 3 (three) times daily as needed for itching or anxiety. 01/28/23   Danford, Earl Lites, MD  Multiple Vitamin (MULTIVITAMIN WITH  MINERALS) TABS tablet Take 1 tablet by mouth daily. 05/20/22   [name], MD  naltrexone (DEPADE) 50 MG tablet Take 1 tablet (50 mg total) by mouth daily. Patient not taking: Reported on 05/02/2023 01/28/23   Alberteen Sam, MD  thiamine (VITAMIN B-1) 100 MG tablet Take 1 tablet (100 mg total) by mouth daily. 05/20/22   [name], MD      Allergies    Ketamine    Review of Systems   Review of Systems  All other systems reviewed and are negative.   Physical Exam Updated Vital Signs BP 126/87   Pulse 93   Temp 98.2 F (36.8 C) (Oral)   Resp 17   Ht 5\' 10"  (1.778 m)   Wt 86.2 kg   SpO2 94%   BMI 27.26 kg/m  Physical Exam Vitals and nursing note reviewed.  Constitutional:      Appearance: He is well-developed.  HENT:     Head: Atraumatic.  Cardiovascular:     Rate and Rhythm: Tachycardia present.  Pulmonary:     Effort: Pulmonary effort is normal.  Musculoskeletal:     Cervical back: Neck supple.  Skin:    General: Skin is warm.  Neurological:     Mental Status: He is alert and oriented to person, place, and time. Mental status is at baseline.     Cranial Nerves: No cranial nerve  deficit.     Sensory: No sensory deficit.     Motor: No weakness.     Coordination: Coordination normal.     ED Results / Procedures / Treatments   Labs (all labs ordered are listed, but only abnormal results are displayed) Labs Reviewed  COMPREHENSIVE METABOLIC PANEL - Abnormal; Notable for the following components:      Result Value   Calcium 8.4 (*)    AST 47 (*)    All other components within normal limits  ETHANOL  CBC WITH DIFFERENTIAL/PLATELET  RAPID URINE DRUG SCREEN, HOSP PERFORMED    EKG None  Radiology DG Chest Port 1 View  Result Date: 05/03/2023 CLINICAL DATA:  Short of breath. EXAM: PORTABLE CHEST 1 VIEW COMPARISON:  01/27/2023. FINDINGS: Cardiac silhouette normal in size.  No mediastinal or masses. Clear lungs.  No pleural effusion or pneumothorax.  Skeletal structures grossly intact. IMPRESSION: No active disease. Electronically Signed   By: Amie Portland M.D.   On: 05/03/2023 07:26    Procedures Procedures    Medications Ordered in ED Medications  LORazepam (ATIVAN) injection 0-4 mg (3 mg Intravenous Given 05/04/23 1106)    Or  LORazepam (ATIVAN) tablet 0-4 mg ( Oral See Alternative 05/04/23 1106)  LORazepam (ATIVAN) injection 0-4 mg (has no administration in time range)    Or  LORazepam (ATIVAN) tablet 0-4 mg (has no administration in time range)  thiamine (VITAMIN B1) tablet 100 mg (100 mg Oral Given 05/04/23 1109)    Or  thiamine (VITAMIN B1) injection 100 mg ( Intravenous See Alternative 05/04/23 1109)  chlordiazePOXIDE (LIBRIUM) capsule 50 mg (has no administration in time range)    ED Course/ Medical Decision Making/ A&P                                 Medical Decision Making Amount and/or Complexity of Data Reviewed Labs: ordered.  Risk OTC drugs. Prescription drug management.   This patient presents to the ED with chief complaint(s) of alcohol detox with pertinent past medical history of alcohol use disorder with withdrawal symptoms that have included complications such as DTs and seizures.The complaint involves an extensive differential diagnosis and also carries with it a high risk of complications and morbidity.    The differential diagnosis includes : Alcohol withdrawal without complication, alcohol intoxication.  At this time patient is not having DTs simply given the fact that his last known alcoholic beverage was at 3 AM.  Of note, patient was admitted for over 48 hours, and it appears that he was stable enough to leave AMA yesterday.  The initial plan is to get basic labs, we will check with our behavioral health team to see if patient can be placed somewhere for detox.  I discussed with the patient that every time he seeks detox, in his case the risk of complications are much higher and that he has required  intubation in the past.  Intubation itself carries significant risk including infection, complications from the procedure and complications from the sedation.  In my judgment, patient currently does not have severe alcohol withdrawal and admitting someone with the pretense that they might have complication, especially when they are not sure if they are ready to quit increases the risk of unintended harm.  I will consult our psychiatry team to see if there is a bed available at a detox facility.  Patient has received 3 of Ativan for CIWA of  20 and on my previous assessment he has heart rate in the 90s, is AOx3 and denies any hallucinations.  Patient lives by himself, but states that he has sponsors that can check on him.  He is willing to try Librium taper as well.  He states that he has never tempted to sober up at home.  Additional history obtained: Records reviewed previous admission documents  Independent labs interpretation:  The following labs were independently interpreted: Patient has normal ethanol level, normal labs.  Consultation: - Consulted or discussed management/test interpretation with external professional: Consulted psychiatry team.  Dr. Cyndie Chime indicated that if patient has history of DTs, then they are not eligible for facility based care unit.  I called and spoke with patient's sister.  Discussed with her that patient is in the ER for relapse and withdrawal and discussed with her that currently patient is not having any symptoms mandating admission, but for them to check on him to ensure that he is trending in the right direction.  Advised patient to call family as well.  Patient also indicates that he has sponsors who can check on him.  Stable for discharge at this time with Librium.   Final Clinical Impression(s) / ED Diagnoses Final diagnoses:  Alcohol withdrawal syndrome without complication [facility])    Rx / DC Orders ED Discharge Orders     None          Derwood Kaplan, MD 05/04/23 1346

## 2023-05-09 ENCOUNTER — Telehealth (INDEPENDENT_AMBULATORY_CARE_PROVIDER_SITE_OTHER): Payer: Self-pay | Admitting: Otolaryngology

## 2023-05-09 NOTE — Telephone Encounter (Signed)
Tried calling patient to schedule appointment for an ED Follow Up.  I was unable to leave a message because the mailbox was full.

## 2023-05-12 ENCOUNTER — Other Ambulatory Visit: Payer: Self-pay

## 2023-05-12 ENCOUNTER — Inpatient Hospital Stay (HOSPITAL_COMMUNITY)
Admission: EM | Admit: 2023-05-12 | Discharge: 2023-05-19 | DRG: 894 | Discharge status: Against Medical Advice | Payer: BLUE CROSS/BLUE SHIELD | Attending: Family Medicine | Admitting: Family Medicine

## 2023-05-12 ENCOUNTER — Encounter (HOSPITAL_COMMUNITY): Payer: Self-pay | Admitting: Emergency Medicine

## 2023-05-12 DIAGNOSIS — W1830XA Fall on same level, unspecified, initial encounter: Secondary | ICD-10-CM | POA: Diagnosis present

## 2023-05-12 DIAGNOSIS — Z79899 Other long term (current) drug therapy: Secondary | ICD-10-CM

## 2023-05-12 DIAGNOSIS — A0472 Enterocolitis due to Clostridium difficile, not specified as recurrent: Secondary | ICD-10-CM | POA: Diagnosis not present

## 2023-05-12 DIAGNOSIS — Z5329 Procedure and treatment not carried out because of patient's decision for other reasons: Secondary | ICD-10-CM | POA: Diagnosis not present

## 2023-05-12 DIAGNOSIS — F10930 Alcohol use, unspecified with withdrawal, uncomplicated: Secondary | ICD-10-CM

## 2023-05-12 DIAGNOSIS — Y9301 Activity, walking, marching and hiking: Secondary | ICD-10-CM | POA: Diagnosis present

## 2023-05-12 DIAGNOSIS — F1010 Alcohol abuse, uncomplicated: Principal | ICD-10-CM | POA: Diagnosis present

## 2023-05-12 DIAGNOSIS — K760 Fatty (change of) liver, not elsewhere classified: Secondary | ICD-10-CM | POA: Diagnosis present

## 2023-05-12 DIAGNOSIS — F10939 Alcohol use, unspecified with withdrawal, unspecified: Secondary | ICD-10-CM | POA: Diagnosis present

## 2023-05-12 DIAGNOSIS — F10239 Alcohol dependence with withdrawal, unspecified: Principal | ICD-10-CM | POA: Diagnosis present

## 2023-05-12 DIAGNOSIS — S022XXA Fracture of nasal bones, initial encounter for closed fracture: Secondary | ICD-10-CM | POA: Diagnosis present

## 2023-05-12 DIAGNOSIS — F32A Depression, unspecified: Secondary | ICD-10-CM | POA: Diagnosis present

## 2023-05-12 DIAGNOSIS — F10229 Alcohol dependence with intoxication, unspecified: Secondary | ICD-10-CM | POA: Diagnosis present

## 2023-05-12 DIAGNOSIS — Z884 Allergy status to anesthetic agent status: Secondary | ICD-10-CM

## 2023-05-12 DIAGNOSIS — Y908 Blood alcohol level of 240 mg/100 ml or more: Secondary | ICD-10-CM | POA: Diagnosis present

## 2023-05-12 DIAGNOSIS — F419 Anxiety disorder, unspecified: Secondary | ICD-10-CM | POA: Diagnosis present

## 2023-05-12 DIAGNOSIS — K7010 Alcoholic hepatitis without ascites: Secondary | ICD-10-CM | POA: Diagnosis present

## 2023-05-12 DIAGNOSIS — D6959 Other secondary thrombocytopenia: Secondary | ICD-10-CM | POA: Diagnosis present

## 2023-05-12 DIAGNOSIS — R824 Acetonuria: Secondary | ICD-10-CM | POA: Diagnosis present

## 2023-05-12 DIAGNOSIS — Y92230 Patient room in hospital as the place of occurrence of the external cause: Secondary | ICD-10-CM | POA: Diagnosis present

## 2023-05-12 DIAGNOSIS — L03211 Cellulitis of face: Secondary | ICD-10-CM | POA: Diagnosis present

## 2023-05-12 DIAGNOSIS — R251 Tremor, unspecified: Secondary | ICD-10-CM | POA: Diagnosis present

## 2023-05-12 LAB — URINALYSIS, ROUTINE W REFLEX MICROSCOPIC
Bilirubin Urine: NEGATIVE
Glucose, UA: NEGATIVE mg/dL
Hgb urine dipstick: NEGATIVE
Ketones, ur: 20 mg/dL — AB
Leukocytes,Ua: NEGATIVE
Nitrite: NEGATIVE
Protein, ur: NEGATIVE mg/dL
Specific Gravity, Urine: 1.013 (ref 1.005–1.030)
pH: 5 (ref 5.0–8.0)

## 2023-05-12 LAB — COMPREHENSIVE METABOLIC PANEL
ALT: 212 U/L — ABNORMAL HIGH (ref 0–44)
AST: 292 U/L — ABNORMAL HIGH (ref 15–41)
Albumin: 4.8 g/dL (ref 3.5–5.0)
Alkaline Phosphatase: 47 U/L (ref 38–126)
Anion gap: 19 — ABNORMAL HIGH (ref 5–15)
BUN: 11 mg/dL (ref 6–20)
CO2: 26 mmol/L (ref 22–32)
Calcium: 8.2 mg/dL — ABNORMAL LOW (ref 8.9–10.3)
Chloride: 92 mmol/L — ABNORMAL LOW (ref 98–111)
Creatinine, Ser: 1.02 mg/dL (ref 0.61–1.24)
GFR, Estimated: >60 mL/min (ref >60)
Glucose, Bld: 84 mg/dL (ref 70–99)
Potassium: 3.6 mmol/L (ref 3.5–5.1)
Sodium: 137 mmol/L (ref 135–145)
Total Bilirubin: 0.6 mg/dL (ref <1.2)
Total Protein: 7.5 g/dL (ref 6.5–8.1)

## 2023-05-12 LAB — CBC WITH DIFFERENTIAL/PLATELET
Abs Immature Granulocytes: 0.02 10*3/uL (ref 0.00–0.07)
Basophils Absolute: 0 10*3/uL (ref 0.0–0.1)
Basophils Relative: 0 %
Eosinophils Absolute: 0 10*3/uL (ref 0.0–0.5)
Eosinophils Relative: 0 %
HCT: 51.3 % (ref 39.0–52.0)
Hemoglobin: 17.1 g/dL — ABNORMAL HIGH (ref 13.0–17.0)
Immature Granulocytes: 0 %
Lymphocytes Relative: 28 %
Lymphs Abs: 2.1 10*3/uL (ref 0.7–4.0)
MCH: 32.6 pg (ref 26.0–34.0)
MCHC: 33.3 g/dL (ref 30.0–36.0)
MCV: 97.9 fL (ref 80.0–100.0)
Monocytes Absolute: 0.7 10*3/uL (ref 0.1–1.0)
Monocytes Relative: 9 %
Neutro Abs: 4.5 10*3/uL (ref 1.7–7.7)
Neutrophils Relative %: 63 %
Platelets: 173 10*3/uL (ref 150–400)
RBC: 5.24 MIL/uL (ref 4.22–5.81)
RDW: 13.2 % (ref 11.5–15.5)
WBC: 7.3 10*3/uL (ref 4.0–10.5)
nRBC: 0 % (ref 0.0–0.2)

## 2023-05-12 LAB — RAPID URINE DRUG SCREEN, HOSP PERFORMED
Amphetamines: NOT DETECTED
Barbiturates: POSITIVE — AB
Benzodiazepines: POSITIVE — AB
Cocaine: NOT DETECTED
Opiates: NOT DETECTED
Tetrahydrocannabinol: NOT DETECTED

## 2023-05-12 LAB — ETHANOL: Alcohol, Ethyl (B): 374 mg/dL (ref <10)

## 2023-05-12 LAB — MAGNESIUM: Magnesium: 2.5 mg/dL — ABNORMAL HIGH (ref 1.7–2.4)

## 2023-05-12 MED ORDER — THIAMINE MONONITRATE 100 MG PO TABS
100.0000 mg | ORAL_TABLET | Freq: Every day | ORAL | Status: DC
Start: 2023-05-12 — End: 2023-05-12

## 2023-05-12 MED ORDER — LORAZEPAM 2 MG/ML IJ SOLN
0.0000 mg | Freq: Four times a day (QID) | INTRAMUSCULAR | Status: DC
  Administered 2023-05-12: 2 mg via INTRAVENOUS
  Filled 2023-05-12: qty 1

## 2023-05-12 MED ORDER — ADULT MULTIVITAMIN W/MINERALS CH
1.0000 | ORAL_TABLET | Freq: Every day | ORAL | Status: DC
  Administered 2023-05-12 – 2023-05-19 (×7): 1 via ORAL
  Filled 2023-05-12 (×8): qty 1

## 2023-05-12 MED ORDER — ONDANSETRON HCL 4 MG/2ML IJ SOLN
4.0000 mg | Freq: Four times a day (QID) | INTRAMUSCULAR | Status: DC | PRN
  Administered 2023-05-12 – 2023-05-14 (×2): 4 mg via INTRAVENOUS
  Filled 2023-05-12 (×2): qty 2

## 2023-05-12 MED ORDER — FOLIC ACID 1 MG PO TABS
1.0000 mg | ORAL_TABLET | Freq: Every day | ORAL | Status: DC
  Administered 2023-05-12 – 2023-05-19 (×7): 1 mg via ORAL
  Filled 2023-05-12 (×8): qty 1

## 2023-05-12 MED ORDER — CHLORDIAZEPOXIDE HCL 25 MG PO CAPS
25.0000 mg | ORAL_CAPSULE | ORAL | Status: AC
  Administered 2023-05-14 – 2023-05-15 (×2): 25 mg via ORAL
  Filled 2023-05-12 (×2): qty 1

## 2023-05-12 MED ORDER — CHLORDIAZEPOXIDE HCL 25 MG PO CAPS
25.0000 mg | ORAL_CAPSULE | Freq: Four times a day (QID) | ORAL | Status: AC
  Administered 2023-05-12 – 2023-05-13 (×4): 25 mg via ORAL
  Filled 2023-05-12 (×4): qty 1

## 2023-05-12 MED ORDER — CHLORDIAZEPOXIDE HCL 25 MG PO CAPS: 25.0000 mg | ORAL_CAPSULE | Freq: Every day | ORAL | Status: DC

## 2023-05-12 MED ORDER — LORAZEPAM 2 MG/ML IJ SOLN
1.0000 mg | INTRAMUSCULAR | Status: DC | PRN
  Administered 2023-05-12 – 2023-05-13 (×5): 3 mg via INTRAVENOUS
  Administered 2023-05-13: 2 mg via INTRAVENOUS
  Administered 2023-05-13 – 2023-05-14 (×2): 4 mg via INTRAVENOUS
  Administered 2023-05-14: 3 mg via INTRAVENOUS
  Administered 2023-05-14: 4 mg via INTRAVENOUS
  Administered 2023-05-14: 3 mg via INTRAVENOUS
  Administered 2023-05-14: 4 mg via INTRAVENOUS
  Administered 2023-05-14 (×2): 3 mg via INTRAVENOUS
  Administered 2023-05-15 (×2): 2 mg via INTRAVENOUS
  Filled 2023-05-12: qty 2
  Filled 2023-05-12: qty 1
  Filled 2023-05-12 (×7): qty 2
  Filled 2023-05-12: qty 1
  Filled 2023-05-12 (×4): qty 2
  Filled 2023-05-12: qty 1
  Filled 2023-05-12: qty 2
  Filled 2023-05-12: qty 1
  Filled 2023-05-12: qty 2

## 2023-05-12 MED ORDER — LOPERAMIDE HCL 2 MG PO CAPS: 2.0000 mg | ORAL_CAPSULE | ORAL | Status: AC | PRN

## 2023-05-12 MED ORDER — LORAZEPAM 2 MG/ML IJ SOLN
2.0000 mg | Freq: Once | INTRAMUSCULAR | Status: AC
  Administered 2023-05-12: 2 mg via INTRAVENOUS
  Filled 2023-05-12: qty 1

## 2023-05-12 MED ORDER — LORAZEPAM 1 MG PO TABS: 0.0000 mg | ORAL_TABLET | Freq: Two times a day (BID) | ORAL | Status: DC

## 2023-05-12 MED ORDER — CHLORDIAZEPOXIDE HCL 25 MG PO CAPS
25.0000 mg | ORAL_CAPSULE | Freq: Three times a day (TID) | ORAL | Status: AC
  Administered 2023-05-13 – 2023-05-14 (×3): 25 mg via ORAL
  Filled 2023-05-12 (×3): qty 1

## 2023-05-12 MED ORDER — SODIUM CHLORIDE 0.9 % IV BOLUS
1000.0000 mL | Freq: Once | INTRAVENOUS | Status: AC
  Administered 2023-05-12: 1000 mL via INTRAVENOUS

## 2023-05-12 MED ORDER — LORAZEPAM 1 MG PO TABS
0.0000 mg | ORAL_TABLET | Freq: Four times a day (QID) | ORAL | Status: DC
  Administered 2023-05-12: 2 mg via ORAL
  Filled 2023-05-12: qty 2

## 2023-05-12 MED ORDER — LORAZEPAM 1 MG PO TABS
1.0000 mg | ORAL_TABLET | ORAL | Status: DC | PRN
Start: 2023-05-12 — End: 2023-05-15
  Administered 2023-05-13 (×2): 3 mg via ORAL
  Administered 2023-05-13: 2 mg via ORAL
  Administered 2023-05-13: 3 mg via ORAL
  Filled 2023-05-12 (×2): qty 3
  Filled 2023-05-12: qty 2
  Filled 2023-05-12: qty 3

## 2023-05-12 MED ORDER — ESCITALOPRAM OXALATE 20 MG PO TABS
20.0000 mg | ORAL_TABLET | Freq: Every day | ORAL | Status: DC
  Administered 2023-05-12 – 2023-05-14 (×3): 20 mg via ORAL
  Filled 2023-05-12 (×2): qty 2
  Filled 2023-05-12: qty 1
  Filled 2023-05-12: qty 2

## 2023-05-12 MED ORDER — THIAMINE MONONITRATE 100 MG PO TABS
100.0000 mg | ORAL_TABLET | Freq: Every day | ORAL | Status: DC
  Administered 2023-05-13 – 2023-05-19 (×5): 100 mg via ORAL
  Filled 2023-05-12 (×6): qty 1

## 2023-05-12 MED ORDER — ACETAMINOPHEN 650 MG RE SUPP: 650.0000 mg | Freq: Four times a day (QID) | RECTAL | Status: DC | PRN

## 2023-05-12 MED ORDER — ONDANSETRON HCL 4 MG PO TABS
4.0000 mg | ORAL_TABLET | Freq: Four times a day (QID) | ORAL | Status: DC | PRN
  Administered 2023-05-18: 4 mg via ORAL
  Filled 2023-05-12: qty 1

## 2023-05-12 MED ORDER — ACETAMINOPHEN 325 MG PO TABS
650.0000 mg | ORAL_TABLET | Freq: Four times a day (QID) | ORAL | Status: DC | PRN
  Administered 2023-05-12 – 2023-05-17 (×5): 650 mg via ORAL
  Filled 2023-05-12 (×5): qty 2

## 2023-05-12 MED ORDER — SENNOSIDES-DOCUSATE SODIUM 8.6-50 MG PO TABS: 1.0000 | ORAL_TABLET | Freq: Every evening | ORAL | Status: DC | PRN

## 2023-05-12 MED ORDER — THIAMINE HCL 100 MG/ML IJ SOLN
100.0000 mg | Freq: Every day | INTRAMUSCULAR | Status: DC
  Administered 2023-05-14: 100 mg via INTRAVENOUS
  Filled 2023-05-12: qty 2

## 2023-05-12 MED ORDER — LORAZEPAM 2 MG/ML IJ SOLN: 0.0000 mg | Freq: Two times a day (BID) | INTRAMUSCULAR | Status: DC

## 2023-05-12 MED ORDER — HYDROXYZINE HCL 25 MG PO TABS
25.0000 mg | ORAL_TABLET | Freq: Four times a day (QID) | ORAL | Status: DC | PRN
  Administered 2023-05-12: 25 mg via ORAL
  Filled 2023-05-12: qty 1

## 2023-05-12 MED ORDER — ENOXAPARIN SODIUM 40 MG/0.4ML IJ SOSY
40.0000 mg | PREFILLED_SYRINGE | INTRAMUSCULAR | Status: DC
  Administered 2023-05-12 – 2023-05-18 (×7): 40 mg via SUBCUTANEOUS
  Filled 2023-05-12 (×7): qty 0.4

## 2023-05-12 MED ORDER — THIAMINE HCL 100 MG/ML IJ SOLN
100.0000 mg | Freq: Every day | INTRAMUSCULAR | Status: DC
  Administered 2023-05-12: 100 mg via INTRAVENOUS
  Filled 2023-05-12: qty 2

## 2023-05-12 NOTE — ED Notes (Signed)
Up to nurses station. Pt reiterates "ativan didn't do anything". Timeframe and process explained. Returned to Doctor, general practice.

## 2023-05-12 NOTE — ED Notes (Signed)
Sleeping after ativan. Remains restless, shaky, tremulous, agitated.

## 2023-05-12 NOTE — ED Notes (Signed)
Admitting remains at [facility]

## 2023-05-12 NOTE — H&P (Signed)
History and Physical    Patient: Jermaine [name] UEA:[id number] DOB: [birth year] DOA: 05/12/2023 DOS: the patient was seen and examined on 05/12/2023 PCP: Farris Has, MD  Patient coming from: Home  Chief Complaint:  Chief Complaint  Patient presents with   Alcohol Intoxication   HPI: Jermaine [name] is a 57 y.o. male with medical history significant of alcohol use disorder, EtOH withdrawal seizure, anxiety and depression who presents to the ED via EMS due to concern for EtOH withdrawal.  He was recently admitted for EtOH withdrawal from 12/4-12/6 but left AMA. He returned to the ED on 12/7 and was not found in significant withdrawal however psych did not think it was safe for patient to be admitted for detox due to his history of seizure and coma with EtOH withdrawals in the past.  He was discharged home on Librium which he states he took but had hallucinations while taking them. However, he is willing to try them again. 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 Reports drinking 1.5 case of white claw seltzer per day. He was found to be intoxicated on arrival but started going into withdrawal symptoms after more than 5 hours in the ED. He endorsed nervousness, anxiety and headache but denies any hallucinations, chest pain, shortness of breath or vision changes.  ED course: Initial vitals with temp 98.5, RR 20, HR 109, BP 117/91, SpO2 96% on room air Labs show sodium 137, K+ 3.6, bicarb 26, creatinine 1.02, AST/ALT 292/212, alk phos 47, T. bili 0.6, WBC 7.3, Hgb 17.1, platelet 173, ethanol level 374, mag 2.5, UDS positive for benzos and barbiturates, UA with ketonuria but no signs of infection. EKG shows normal sinus rhythm Patient received IV Ativan 2 mg x 1 and placed on CIWA protocol TRH was consulted for admission  Review of Systems: As mentioned in the history of present illness. All other systems reviewed and  are negative. Past Medical History:  Diagnosis Date   Alcohol abuse    Anxiety    Class 1 obesity 09/14/2021   Depression    Past Surgical History:  Procedure Laterality Date   HERNIA REPAIR     Social History:  reports that he has never smoked. He has never used smokeless tobacco. He reports current alcohol use of about 20.0 standard drinks of alcohol per week. He reports that he does not currently use drugs.  Allergies  Allergen Reactions   Ketamine     Hx of causing fever in 2024     Family History  Adopted: Yes    Prior to Admission medications   Medication Sig Start Date End Date Taking? Authorizing Provider  chlordiazePOXIDE (LIBRIUM) 25 MG capsule 50mg  PO TID x 1D, then 25-50mg  PO BID X 1D, then 25-50mg  PO QD X 1D 05/04/23   Nanavati, Ankit, MD  escitalopram (LEXAPRO) 20 MG tablet Take 20 mg by mouth daily. 02/27/19   [provider]  folic acid (FOLVITE) 1 MG tablet Take 1 tablet (1 mg total) by mouth daily. 01/28/23   Danford, Earl Lites, MD  hydrOXYzine (ATARAX) 25 MG tablet Take 1 tablet (25 mg total) by mouth 3 (three) times daily as needed for itching or anxiety. 01/28/23   Danford, Earl Lites, MD  Multiple Vitamin (MULTIVITAMIN WITH MINERALS) TABS tablet Take 1 tablet by mouth daily. 05/20/22   [name], MD  naltrexone (DEPADE) 50 MG tablet Take 1 tablet (50  mg total) by mouth daily. Patient not taking: Reported on 05/02/2023 01/28/23   Alberteen Sam, MD  thiamine (VITAMIN B-1) 100 MG tablet Take 1 tablet (100 mg total) by mouth daily. 05/20/22   [name], MD    Physical Exam: Vitals:   05/12/23 1003 05/12/23 1516 05/12/23 1520 05/12/23 1628  BP: (!) 128/104 (!) 104/96 (!) 140/96 128/74  Pulse: 93 (!) 108 (!) 108 96  Resp:   18 18  Temp:   99.2 F (37.3 C)   TempSrc:   Oral   SpO2:   99% 95%  Weight:      Height:       General: Pleasant, well-appearing middle-age man laying on stretcher in the hallway. No acute distress. HEENT:  Healing nasal fracture with dried blood in nares. Anicteric sclera CV: RRR. No murmurs, rubs, or gallops. No LE edema Pulmonary: Lungs CTAB. Normal effort. No wheezing or rales. Abdominal: Soft, nontender, nondistended. Normal bowel sounds. Extremities: Palpable radial and DP pulses. Mild tremors of the hands Skin: Warm and dry. No obvious rash or lesions. Neuro: A&Ox3. Moves all extremities. Normal sensation to light touch. No focal deficit. Psych: Slightly anxious  Data Reviewed: Labs show sodium 137, K+ 3.6, bicarb 26, creatinine 1.02, AST/ALT 292/212, alk phos 47, T. bili 0.6, WBC 7.3, Hgb 17.1, platelet 173, ethanol level 374, mag 2.5, UDS positive for benzos and barbiturates, UA with ketonuria but no signs of infection. EKG shows normal sinus rhythm   Assessment and Plan: Jermaine [name] is a 57 y.o. male with medical history significant of alcohol use disorder, EtOH withdrawal seizure, anxiety and depression who presents to the ED via EMS due to concern for EtOH withdrawal admitted for further management.  # EtOH abuse with active withdrawal Gentleman with AUD and EtOH withdrawal seizures presenting again to the ED found to be initially intoxicated but now going into EtOH withdrawal. CIWA scores 12->21. Psych has previously refused inpatient detox due to his history of withdrawal seizures and coma. States he goes to [employer] and has a sponsor but he has been having trouble with the 4th step in the 12-step plan. He was previously on Natrexone for few months without drinking so advised him he will have to try this again and work closely with his sponsor if inpatient detox is not an option. -Admit to progressive bed -Continue CIWA with Ativan -Librium taper -PRN hydroxyzine, Zofran and Imodium -MVI, folate and thiamine -Trend and replete electrolytes -Telemetry  # Anxiety and depression -Continue Lexapro -As needed hydroxyzine   Advance Care Planning:   Code Status: Full Code   Consults:  None  Family Communication: No family at bedside  Severity of Illness: The appropriate patient status for this patient is OBSERVATION. Observation status is judged to be reasonable and necessary in order to provide the required intensity of service to ensure the patient's safety. The patient's presenting symptoms, physical exam findings, and initial radiographic and laboratory data in the context of their medical condition is felt to place them at decreased risk for further clinical deterioration. Furthermore, it is anticipated that the patient will be medically stable for discharge from the hospital within 2 midnights of admission.   Author: Steffanie Rainwater, MD 05/12/2023 5:08 PM  For on call review [email or web address].

## 2023-05-12 NOTE — ED Provider Notes (Signed)
Patient seen after prior EDP.  Patient with signs concerning for early withdrawal.  Hospitalist service made aware of case and will evaluate for admission.   Wynetta Fines, MD 05/12/23 [phone]

## 2023-05-12 NOTE — ED Notes (Addendum)
EDP at [facility] with [employer] video [employer] interpreter

## 2023-05-12 NOTE — ED Notes (Signed)
Mildly calmer. EKG in progress.

## 2023-05-12 NOTE — ED Notes (Addendum)
Pt is sleeping, will get EKG and urine when he wakes up.

## 2023-05-12 NOTE — ED Notes (Signed)
EDP at [facility], speaking with pt. Pt alert, NAD, calm and restless, fidgety, agitated, restless, cooperative, shaky, polite.

## 2023-05-12 NOTE — ED Notes (Signed)
Unable to give urine sample at this time, Pt aware of needing one.

## 2023-05-12 NOTE — ED Notes (Signed)
Admitting MD at [facility]. Pt arousable to voice, interactive, participatory.

## 2023-05-12 NOTE — ED Notes (Signed)
Sleeping, restless, sonorous resps, NAD.

## 2023-05-12 NOTE — ED Notes (Signed)
Pt becoming very agitated.

## 2023-05-12 NOTE — ED Provider Notes (Signed)
[place] EMERGENCY DEPARTMENT AT [facility] Provider Note   CSN: [id number] Arrival date & time: 05/12/23  2841     History  Chief Complaint  Patient presents with   Alcohol Intoxication    Jermaine [name] is a 57 y.o. male.  Pt is a 57 yo male with pmhx significant for alcohol abuse, anxiety, and depression.  Pt has had seizure and coma with etoh withdrawal in the past.  He was admitted from 12/4-12/6 for w/dr, but signed out AMA on the 6th.  He came back to the ED on the 7th for the same.  He was not in w/dr at that time and psych did not want to admit to detox due to risk of w/dr.  So, he was sent home with Librium.  Pt said he's been drinking for the past 4 days.  His last drink was 4 days ago.         Home Medications Prior to Admission medications   Medication Sig Start Date End Date Taking? Authorizing Provider  chlordiazePOXIDE (LIBRIUM) 25 MG capsule 50mg  PO TID x 1D, then 25-50mg  PO BID X 1D, then 25-50mg  PO QD X 1D 05/04/23   Nanavati, Ankit, MD  escitalopram (LEXAPRO) 20 MG tablet Take 20 mg by mouth daily. 02/27/19   [provider]  folic acid (FOLVITE) 1 MG tablet Take 1 tablet (1 mg total) by mouth daily. 01/28/23   Danford, Earl Lites, MD  hydrOXYzine (ATARAX) 25 MG tablet Take 1 tablet (25 mg total) by mouth 3 (three) times daily as needed for itching or anxiety. 01/28/23   Danford, Earl Lites, MD  Multiple Vitamin (MULTIVITAMIN WITH MINERALS) TABS tablet Take 1 tablet by mouth daily. 05/20/22   [name], MD  naltrexone (DEPADE) 50 MG tablet Take 1 tablet (50 mg total) by mouth daily. Patient not taking: Reported on 05/02/2023 01/28/23   Alberteen Sam, MD  thiamine (VITAMIN B-1) 100 MG tablet Take 1 tablet (100 mg total) by mouth daily. 05/20/22   [name], MD      Allergies    Ketamine    Review of Systems   Review of Systems  Psychiatric/Behavioral:  The patient is nervous/anxious.   All other systems reviewed and are  negative.   Physical Exam Updated Vital Signs BP (!) 128/104   Pulse 93   Temp 98.5 F (36.9 C) (Oral)   Resp 20   Ht 5\' 10"  (1.778 m)   Wt 86.2 kg   SpO2 96%   BMI 27.26 kg/m  Physical Exam Vitals and nursing note reviewed.  Constitutional:      Appearance: Normal appearance.  HENT:     Head: Normocephalic and atraumatic.     Comments: Healing nasal lac    Right Ear: External ear normal.     Left Ear: External ear normal.     Nose: Nose normal.     Mouth/Throat:     Mouth: Mucous membranes are moist.     Pharynx: Oropharynx is clear.  Eyes:     Extraocular Movements: Extraocular movements intact.     Conjunctiva/sclera: Conjunctivae normal.     Pupils: Pupils are equal, round, and reactive to light.  Cardiovascular:     Rate and Rhythm: Regular rhythm. Tachycardia present.     Pulses: Normal pulses.     Heart sounds: Normal heart sounds.  Pulmonary:     Effort: Pulmonary effort is normal.     Breath sounds: Normal breath sounds.  Abdominal:  General: Abdomen is flat. Bowel sounds are normal.     Palpations: Abdomen is soft.  Musculoskeletal:        General: Normal range of motion.     Cervical back: Normal range of motion and neck supple.  Skin:    General: Skin is warm.     Capillary Refill: Capillary refill takes less than 2 seconds.  Neurological:     General: No focal deficit present.     Mental Status: He is alert and oriented to person, place, and time.  Psychiatric:        Mood and Affect: Mood is anxious.     ED Results / Procedures / Treatments   Labs (all labs ordered are listed, but only abnormal results are displayed) Labs Reviewed  COMPREHENSIVE METABOLIC PANEL - Abnormal; Notable for the following components:      Result Value   Chloride 92 (*)    Calcium 8.2 (*)    AST 292 (*)    ALT 212 (*)    Anion gap 19 (*)    All other components within normal limits  ETHANOL - Abnormal; Notable for the following components:   Alcohol, Ethyl  (B) 374 (*)    All other components within normal limits  CBC WITH DIFFERENTIAL/PLATELET - Abnormal; Notable for the following components:   Hemoglobin 17.1 (*)    All other components within normal limits  MAGNESIUM - Abnormal; Notable for the following components:   Magnesium 2.5 (*)    All other components within normal limits  RAPID URINE DRUG SCREEN, HOSP PERFORMED  URINALYSIS, ROUTINE W REFLEX MICROSCOPIC    EKG None  Radiology No results found.  Procedures Procedures    Medications Ordered in ED Medications  LORazepam (ATIVAN) injection 0-4 mg ( Intravenous Not Given 05/12/23 1100)    Or  LORazepam (ATIVAN) tablet 0-4 mg ( Oral See Alternative 05/12/23 1100)  LORazepam (ATIVAN) injection 0-4 mg (has no administration in time range)    Or  LORazepam (ATIVAN) tablet 0-4 mg (has no administration in time range)  thiamine (VITAMIN B1) tablet 100 mg ( Oral See Alternative 05/12/23 1012)    Or  thiamine (VITAMIN B1) injection 100 mg (100 mg Intravenous Given 05/12/23 1012)  sodium chloride 0.9 % bolus 1,000 mL (0 mLs Intravenous Stopped 05/12/23 1250)  LORazepam (ATIVAN) injection 2 mg (2 mg Intravenous Given 05/12/23 1010)    ED Course/ Medical Decision Making/ A&P                                 Medical Decision Making Amount and/or Complexity of Data Reviewed Labs: ordered.  Risk OTC drugs. Prescription drug management.   This patient presents to the ED for concern of etoh w/dr, this involves an extensive number of treatment options, and is a complaint that carries with it a high risk of complications and morbidity.  The differential diagnosis includes etoh abuse, w/dr, electrolyte abn   Co morbidities that complicate the patient evaluation  alcohol abuse, anxiety, and depression   Additional history obtained:  Additional history obtained from epic chart review External records from outside source obtained and reviewed including EMS report   Lab  Tests:  I Ordered, and personally interpreted labs.  The pertinent results include:  cmp nl, cbc nl, mg nl, etoh elevated at 374   Cardiac Monitoring:  The patient was maintained on a cardiac monitor.  I personally viewed and  interpreted the cardiac monitored which showed an underlying rhythm of: st initially, but now nsr   Medicines ordered and prescription drug management:  I ordered medication including ativan/ivfs  for sx  Reevaluation of the patient after these medicines showed that the patient improved I have reviewed the patients home medicines and have made adjustments as needed  Critical Interventions:  ativan  Problem List / ED Course:  Alcohol intoxication:  pt has been sleeping since he received his ativan.  Plan to reassess after he wakes up.  Usually, he wants to go.  If he is in withdrawal, he will need admission.     Reevaluation:  After the interventions noted above, I reevaluated the patient and found that they have :improved   Social Determinants of Health:  Lives at home   Dispostion:  Pending at shift change        Final Clinical Impression(s) / ED Diagnoses Final diagnoses:  Alcohol abuse    Rx / DC Orders ED Discharge Orders     None         Jacalyn Lefevre, MD 05/12/23 1450

## 2023-05-12 NOTE — ED Triage Notes (Signed)
PT BIB EMS for ETOH, alcohol withdrawal.  States that he was in had a relapse 3 months ago and ended up in a coma and is afraid that is going to happen again. Has been drinking for past 4 days. Last drink was 5 hrs ago. States his face feels swollen.

## 2023-05-12 NOTE — ED Notes (Signed)
Pt restless, agitated, shaking, bouncing, pacing. Verbalizes ativan not working for me (2mg  were given IV and 2mg  were given PO). EDP notified/ updated.

## 2023-05-13 ENCOUNTER — Inpatient Hospital Stay (HOSPITAL_COMMUNITY): Payer: BLUE CROSS/BLUE SHIELD

## 2023-05-13 DIAGNOSIS — F419 Anxiety disorder, unspecified: Secondary | ICD-10-CM | POA: Diagnosis present

## 2023-05-13 DIAGNOSIS — Y908 Blood alcohol level of 240 mg/100 ml or more: Secondary | ICD-10-CM | POA: Diagnosis present

## 2023-05-13 DIAGNOSIS — F10931 Alcohol use, unspecified with withdrawal delirium: Secondary | ICD-10-CM | POA: Diagnosis not present

## 2023-05-13 DIAGNOSIS — F10229 Alcohol dependence with intoxication, unspecified: Secondary | ICD-10-CM | POA: Diagnosis present

## 2023-05-13 DIAGNOSIS — R001 Bradycardia, unspecified: Secondary | ICD-10-CM | POA: Diagnosis not present

## 2023-05-13 DIAGNOSIS — Z5329 Procedure and treatment not carried out because of patient's decision for other reasons: Secondary | ICD-10-CM | POA: Diagnosis not present

## 2023-05-13 DIAGNOSIS — A0472 Enterocolitis due to Clostridium difficile, not specified as recurrent: Secondary | ICD-10-CM | POA: Diagnosis not present

## 2023-05-13 DIAGNOSIS — F32A Depression, unspecified: Secondary | ICD-10-CM | POA: Diagnosis present

## 2023-05-13 DIAGNOSIS — Y9301 Activity, walking, marching and hiking: Secondary | ICD-10-CM | POA: Diagnosis present

## 2023-05-13 DIAGNOSIS — K7010 Alcoholic hepatitis without ascites: Secondary | ICD-10-CM | POA: Diagnosis present

## 2023-05-13 DIAGNOSIS — D6959 Other secondary thrombocytopenia: Secondary | ICD-10-CM | POA: Diagnosis present

## 2023-05-13 DIAGNOSIS — Z884 Allergy status to anesthetic agent status: Secondary | ICD-10-CM | POA: Diagnosis not present

## 2023-05-13 DIAGNOSIS — R251 Tremor, unspecified: Secondary | ICD-10-CM | POA: Diagnosis present

## 2023-05-13 DIAGNOSIS — L03211 Cellulitis of face: Secondary | ICD-10-CM | POA: Diagnosis present

## 2023-05-13 DIAGNOSIS — W1830XA Fall on same level, unspecified, initial encounter: Secondary | ICD-10-CM | POA: Diagnosis present

## 2023-05-13 DIAGNOSIS — F10930 Alcohol use, unspecified with withdrawal, uncomplicated: Secondary | ICD-10-CM | POA: Diagnosis not present

## 2023-05-13 DIAGNOSIS — Z79899 Other long term (current) drug therapy: Secondary | ICD-10-CM | POA: Diagnosis not present

## 2023-05-13 DIAGNOSIS — S022XXA Fracture of nasal bones, initial encounter for closed fracture: Secondary | ICD-10-CM | POA: Diagnosis present

## 2023-05-13 DIAGNOSIS — K760 Fatty (change of) liver, not elsewhere classified: Secondary | ICD-10-CM | POA: Diagnosis present

## 2023-05-13 DIAGNOSIS — F10239 Alcohol dependence with withdrawal, unspecified: Secondary | ICD-10-CM | POA: Diagnosis present

## 2023-05-13 DIAGNOSIS — Y92230 Patient room in hospital as the place of occurrence of the external cause: Secondary | ICD-10-CM | POA: Diagnosis present

## 2023-05-13 DIAGNOSIS — R824 Acetonuria: Secondary | ICD-10-CM | POA: Diagnosis present

## 2023-05-13 LAB — COMPREHENSIVE METABOLIC PANEL
ALT: 289 U/L — ABNORMAL HIGH (ref 0–44)
AST: 386 U/L — ABNORMAL HIGH (ref 15–41)
Albumin: 4.1 g/dL (ref 3.5–5.0)
Alkaline Phosphatase: 46 U/L (ref 38–126)
Anion gap: 10 (ref 5–15)
BUN: 17 mg/dL (ref 6–20)
CO2: 32 mmol/L (ref 22–32)
Calcium: 8 mg/dL — ABNORMAL LOW (ref 8.9–10.3)
Chloride: 94 mmol/L — ABNORMAL LOW (ref 98–111)
Creatinine, Ser: 1.01 mg/dL (ref 0.61–1.24)
GFR, Estimated: >60 mL/min (ref >60)
Glucose, Bld: 102 mg/dL — ABNORMAL HIGH (ref 70–99)
Potassium: 3.8 mmol/L (ref 3.5–5.1)
Sodium: 136 mmol/L (ref 135–145)
Total Bilirubin: 1.1 mg/dL (ref <1.2)
Total Protein: 6.2 g/dL — ABNORMAL LOW (ref 6.5–8.1)

## 2023-05-13 LAB — LIPASE, BLOOD: Lipase: 47 U/L (ref 11–51)

## 2023-05-13 MED ORDER — MORPHINE SULFATE (PF) 2 MG/ML IV SOLN
2.0000 mg | INTRAVENOUS | Status: DC | PRN
  Administered 2023-05-14 – 2023-05-17 (×2): 2 mg via INTRAVENOUS
  Filled 2023-05-13 (×2): qty 1

## 2023-05-13 MED ORDER — AZELASTINE HCL 0.1 % NA SOLN
1.0000 | Freq: Two times a day (BID) | NASAL | Status: DC
  Administered 2023-05-13 – 2023-05-19 (×9): 1 via NASAL
  Filled 2023-05-13 (×2): qty 30

## 2023-05-13 MED ORDER — IOHEXOL 300 MG/ML  SOLN
30.0000 mL | Freq: Once | INTRAMUSCULAR | Status: AC | PRN
  Administered 2023-05-13: 30 mL via ORAL

## 2023-05-13 MED ORDER — SALINE SPRAY 0.65 % NA SOLN
1.0000 | NASAL | Status: DC | PRN
  Administered 2023-05-13: 1 via NASAL
  Filled 2023-05-13: qty 44

## 2023-05-13 NOTE — Plan of Care (Signed)
  Problem: Education: Goal: Knowledge of General Education information will improve Description: Including pain rating scale, medication(s)/side effects and non-pharmacologic comfort measures Outcome: Progressing   Problem: Health Behavior/Discharge Planning: Goal: Ability to manage health-related needs will improve Outcome: Progressing   Problem: Clinical Measurements: Goal: Ability to maintain clinical measurements within normal limits will improve Outcome: Progressing Goal: Diagnostic test results will improve Outcome: Progressing   Problem: Activity: Goal: Risk for activity intolerance will decrease Outcome: Progressing   Problem: Nutrition: Goal: Adequate nutrition will be maintained Outcome: Progressing   Problem: Coping: Goal: Level of anxiety will decrease Outcome: Progressing   Problem: Pain Management: Goal: General experience of comfort will improve Outcome: Progressing   Problem: Safety: Goal: Ability to remain free from injury will improve Outcome: Progressing   Problem: Skin Integrity: Goal: Risk for impaired skin integrity will decrease Outcome: Progressing   Problem: Clinical Measurements: Goal: Will remain free from infection Outcome: Adequate for Discharge Goal: Respiratory complications will improve Outcome: Adequate for Discharge Goal: Cardiovascular complication will be avoided Outcome: Adequate for Discharge   Problem: Elimination: Goal: Will not experience complications related to bowel motility Outcome: Adequate for Discharge Goal: Will not experience complications related to urinary retention Outcome: Adequate for Discharge

## 2023-05-13 NOTE — Progress Notes (Signed)
PROGRESS NOTE    Jermaine [name]  XLK:[id number] DOB: Mar 30, 1966 DOA: 05/12/2023 PCP: Farris Has, MD   Brief Narrative:  HPI: Jermaine [name] is a 57 y.o. male with medical history significant of alcohol use disorder, EtOH withdrawal seizure, anxiety and depression who presents to the ED via EMS due to concern for EtOH withdrawal.  He was recently admitted for EtOH withdrawal from 12/4-12/6 but left AMA. He returned to the ED on 12/7 and was not found in significant withdrawal however psych did not think it was safe for patient to be admitted for detox due to his history of seizure and coma with EtOH withdrawals in the past.  He was discharged home on Librium which he states he took but had hallucinations while taking them. However, he is willing to try them again. 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 Reports drinking 1.5 case of white claw seltzer per day. He was found to be intoxicated on arrival but started going into withdrawal symptoms after more than 5 hours in the ED. He endorsed nervousness, anxiety and headache but denies any hallucinations, chest pain, shortness of breath or vision changes.   ED course: Initial vitals with temp 98.5, RR 20, HR 109, BP 117/91, SpO2 96% on room air Labs show sodium 137, K+ 3.6, bicarb 26, creatinine 1.02, AST/ALT 292/212, alk phos 47, T. bili 0.6, WBC 7.3, Hgb 17.1, platelet 173, ethanol level 374, mag 2.5, UDS positive for benzos and barbiturates, UA with ketonuria but no signs of infection. EKG shows normal sinus rhythm Patient received IV Ativan 2 mg x 1 and placed on CIWA protocol TRH was consulted for admission  Assessment & Plan:   Principal Problem:   Alcohol withdrawal (HCC) Active Problems:   Alcohol abuse  EtOH abuse with active withdrawal Significant withdrawal, CIWA jumped up to 22 in last 24 hours.  Significant withdrawal with gross upper extremity  tremors which is not usual for him.  Continue scheduled Librium with as needed Ativan per CIWA scheduled with multivitamins.  He is at risk of needing Ativan or Precedex drip, will monitor closely.  Alcoholic liver disease/liver lesion: Significant elevated LFTs.  Monitor for now.  Interval increase in size of hypodense liver lesion on the right lobe was noted on the CT abdomen pelvis done in August 2024, nonemergent liver protocol MRI was recommended.  We will do this inpatient once patient is stabilized.   # Anxiety and depression -Continue Lexapro  Nasal trauma: Has vertical nasal slate, says that he fell recently and this was glued after he sought medical attention.  Appears to be healing well.  DVT prophylaxis: enoxaparin (LOVENOX) injection 40 mg Start: 05/12/23 2000   Code Status: Full Code  Family Communication:  None present at bedside.  Plan of care discussed with patient in length and he/she verbalized understanding and agreed with it.  Status is: Observation The patient will require care spanning > 2 midnights and should be moved to inpatient because: Significant alcohol withdrawal   Estimated body mass index is 27.26 kg/m as calculated from the following:   Height as of this encounter: 5\' 10"  (1.778 m).   Weight as of this encounter: 86.2 kg.    Nutritional Assessment: Body mass index is 27.26 kg/m.Marland Kitchen Seen by dietician.  I agree with the assessment and plan as outlined below: Nutrition Status:        . Skin Assessment:  I have examined the patient's skin and I agree with the wound assessment as performed by the wound care RN as outlined below:    Consultants:  None  Procedures:  None  Antimicrobials:  Anti-infectives (From admission, onward)    None         Subjective: Patient seen and examined, he says that he is not feeling well, feels jittery and very anxious.  No other complaint.  Discussed with him that it is very important for him to remain in the  hospital for next couple of days so we can help him go through the withdrawal symptoms remotely with the help of medication and that leaving AGAINST MEDICAL ADVICE is not going to be a good idea.  He understands that.  Objective: Vitals:   05/13/23 0226 05/13/23 0326 05/13/23 0349 05/13/23 0449  BP: (!) 141/95 138/80 131/87 131/87  Pulse: (!) 114 (!) 101 (!) 101 (!) 101  Resp:      Temp: 98.6 F (37 C) 98.2 F (36.8 C)    TempSrc: Oral Oral    SpO2:  94%  94%  Weight:      Height:        Intake/Output Summary (Last 24 hours) at 05/13/2023 0738 Last data filed at 05/12/2023 1250 Gross per 24 hour  Intake 1000 ml  Output --  Net 1000 ml   Filed Weights   05/12/23 0908  Weight: 86.2 kg    Examination:  General exam: Appears to have clear withdrawal symptoms with cross tremors in upper extremities. Respiratory system: Clear to auscultation. Respiratory effort normal. Cardiovascular system: S1 & S2 heard, RRR. No JVD, murmurs, rubs, gallops or clicks. No pedal edema. Gastrointestinal system: Abdomen is nondistended, soft and nontender. No organomegaly or masses felt. Normal bowel sounds heard. Central nervous system: Alert and oriented. No focal neurological deficits. Extremities: Symmetric 5 x 5 power. Skin: No rashes, lesions or ulcers   Data Reviewed: I have personally reviewed following labs and imaging studies  CBC: Recent Labs  Lab 05/12/23 0956  WBC 7.3  NEUTROABS 4.5  HGB 17.1*  HCT 51.3  MCV 97.9  PLT 173   Basic Metabolic Panel: Recent Labs  Lab 05/12/23 0956  NA 137  K 3.6  CL 92*  CO2 26  GLUCOSE 84  BUN 11  CREATININE 1.02  CALCIUM 8.2*  MG 2.5*   GFR: Estimated Creatinine Clearance: 82.5 mL/min (by C-G formula based on SCr of 1.02 mg/dL). Liver Function Tests: Recent Labs  Lab 05/12/23 0956  AST 292*  ALT 212*  ALKPHOS 47  BILITOT 0.6  PROT 7.5  ALBUMIN 4.8   No results for input(s): "LIPASE", "AMYLASE" in the last 168  hours. No results for input(s): "AMMONIA" in the last 168 hours. Coagulation Profile: No results for input(s): "INR", "PROTIME" in the last 168 hours. Cardiac Enzymes: No results for input(s): "CKTOTAL", "CKMB", "CKMBINDEX", "TROPONINI" in the last 168 hours. BNP (last 3 results) No results for input(s): "PROBNP" in the last 8760 hours. HbA1C: No results for input(s): "HGBA1C" in the last 72 hours. CBG: No results for input(s): "GLUCAP" in the last 168 hours. Lipid Profile: No results for input(s): "CHOL", "HDL", "LDLCALC", "TRIG", "CHOLHDL", "LDLDIRECT" in the last 72 hours. Thyroid Function Tests: No results for input(s): "TSH", "T4TOTAL", "FREET4", "T3FREE", "THYROIDAB" in the last 72 hours. Anemia Panel: No results for input(s): "VITAMINB12", "FOLATE", "FERRITIN", "TIBC", "IRON", "RETICCTPCT" in the last 72 hours. Sepsis Labs: No results for input(s): "PROCALCITON", "LATICACIDVEN" in the last  168 hours.  No results found for this or any previous visit (from the past 240 hours).   Radiology Studies: No results found.  Scheduled Meds:  chlordiazePOXIDE  25 mg Oral QID   Followed by   chlordiazePOXIDE  25 mg Oral TID   Followed by   Melene Muller ON 05/14/2023] chlordiazePOXIDE  25 mg Oral BH-qamhs   Followed by   Melene Muller ON 05/15/2023] chlordiazePOXIDE  25 mg Oral Daily   enoxaparin (LOVENOX) injection  40 mg Subcutaneous Q24H   escitalopram  20 mg Oral Daily   folic acid  1 mg Oral Daily   multivitamin with minerals  1 tablet Oral Daily   thiamine  100 mg Oral Daily   Or   thiamine  100 mg Intravenous Daily   Continuous Infusions:   LOS: 0 days   Jermaine Closs, MD [facility]  05/13/2023, 7:38 AM   *Please note that this is a verbal dictation therefore any spelling or grammatical errors are due to the "[employer]agon [employer] One" system interpretation.  Please page via Amion and do not message via secure chat for urgent patient care matters. Secure chat can be used for  non urgent patient care matters.  How to contact the [facility] Attending or Consulting provider 7A - 7P or covering provider during after hours 7P -7A, for this patient?  Check the care team in [facility] and look for a) attending/consulting [facility] provider listed and b) the [facility] team listed. Page or secure chat 7A-7P. Log into [email or web address] and use [facility] universal password to access. If you do not have the password, please contact the hospital operator. Locate the [facility] provider you are looking for under [facility] and page to a number that you can be directly reached. If you still have difficulty reaching the provider, please page the [facility] (Director on Call) for the Hospitalists listed on amion for assistance.

## 2023-05-13 NOTE — TOC CM/SW Note (Addendum)
Transition of Care [facility]) - Inpatient Brief Assessment   Patient Details  Name: Jermaine [name] MRN: [id number] Date of Birth: Jul 22, 1965  [facility] CM/SW Contact:    Howell Rucks, RN Phone Number: 05/13/2023, 2:49 PM   Clinical Narrative: Met with pt at bedside to introduce role of TOC/NCM and review for dc planning. Pt reports he has an established PCP and pharmacy, no current home care services or home DME, pt reports he feels safe returning home with support from  family, pt reports he has a car that is currently in parking deck at [facility], may require assistance with transporation at discharge.  TOC consult for Substance Abuse resources, pt declines, reports he has SA resources. TOC Brief Assessment completed. No TOC needs identified at this time.     Transition of Care Asessment: Insurance and Status: Insurance coverage has been reviewed Patient has primary care physician: Yes Home environment has been reviewed: resides in private residence Prior level of function:: Independent Prior/Current Home Services: No current home services Social Drivers of Health Review: SDOH reviewed no interventions necessary Readmission risk has been reviewed: Yes Transition of care needs: no transition of care needs at this time

## 2023-05-13 NOTE — Progress Notes (Signed)
Mobility Specialist - Progress Note   05/13/23 1552  Mobility  Activity Ambulated with assistance in hallway  Level of Assistance Contact guard assist, steadying assist  Assistive Device Front wheel walker  Distance Ambulated (ft) 200 ft  Range of Motion/Exercises Active  Activity Response Tolerated fair  Mobility Referral Yes  Mobility visit 1 Mobility  Mobility Specialist Start Time (ACUTE ONLY) 1540  Mobility Specialist Stop Time (ACUTE ONLY) 1552  Mobility Specialist Time Calculation (min) (ACUTE ONLY) 12 min   Pt was found in bed and agreeable to ambulate. Was a little shaky during session. At EOS was left sitting EOB with all needs met. Call bell in reach and RN in room.  Billey Chang Mobility Specialist

## 2023-05-13 NOTE — Plan of Care (Signed)
  Problem: Education: Goal: Knowledge of General Education information will improve Description: Including pain rating scale, medication(s)/side effects and non-pharmacologic comfort measures Outcome: Progressing   Problem: Health Behavior/Discharge Planning: Goal: Ability to manage health-related needs will improve Outcome: Progressing   Problem: Clinical Measurements: Goal: Ability to maintain clinical measurements within normal limits will improve Outcome: Progressing Goal: Will remain free from infection Outcome: Progressing Goal: Diagnostic test results will improve Outcome: Progressing Goal: Respiratory complications will improve Outcome: Progressing Goal: Cardiovascular complication will be avoided Outcome: Progressing   Problem: Activity: Goal: Risk for activity intolerance will decrease Outcome: Progressing   Problem: Nutrition: Goal: Adequate nutrition will be maintained Outcome: Progressing   Problem: Coping: Goal: Level of anxiety will decrease Outcome: Progressing   Problem: Elimination: Goal: Will not experience complications related to bowel motility Outcome: Progressing Goal: Will not experience complications related to urinary retention Outcome: Progressing   Problem: Pain Management: Goal: General experience of comfort will improve Outcome: Progressing   Problem: Safety: Goal: Ability to remain free from injury will improve Outcome: Progressing   Problem: Skin Integrity: Goal: Risk for impaired skin integrity will decrease Outcome: Progressing  Pt still has some tremors in his hands. Pt has been resting and very calm

## 2023-05-14 DIAGNOSIS — F10930 Alcohol use, unspecified with withdrawal, uncomplicated: Secondary | ICD-10-CM | POA: Diagnosis not present

## 2023-05-14 LAB — COMPREHENSIVE METABOLIC PANEL
ALT: 464 U/L — ABNORMAL HIGH (ref 0–44)
AST: 566 U/L — ABNORMAL HIGH (ref 15–41)
Albumin: 3.8 g/dL (ref 3.5–5.0)
Alkaline Phosphatase: 61 U/L (ref 38–126)
Anion gap: 11 (ref 5–15)
BUN: 11 mg/dL (ref 6–20)
CO2: 31 mmol/L (ref 22–32)
Calcium: 8.8 mg/dL — ABNORMAL LOW (ref 8.9–10.3)
Chloride: 96 mmol/L — ABNORMAL LOW (ref 98–111)
Creatinine, Ser: 0.87 mg/dL (ref 0.61–1.24)
GFR, Estimated: >60 mL/min (ref >60)
Glucose, Bld: 82 mg/dL (ref 70–99)
Potassium: 3.6 mmol/L (ref 3.5–5.1)
Sodium: 138 mmol/L (ref 135–145)
Total Bilirubin: 0.8 mg/dL (ref <1.2)
Total Protein: 6.4 g/dL — ABNORMAL LOW (ref 6.5–8.1)

## 2023-05-14 LAB — CBC WITH DIFFERENTIAL/PLATELET
Abs Immature Granulocytes: 0.04 10*3/uL (ref 0.00–0.07)
Basophils Absolute: 0 10*3/uL (ref 0.0–0.1)
Basophils Relative: 1 %
Eosinophils Absolute: 0.2 10*3/uL (ref 0.0–0.5)
Eosinophils Relative: 4 %
HCT: 48.7 % (ref 39.0–52.0)
Hemoglobin: 16.1 g/dL (ref 13.0–17.0)
Immature Granulocytes: 1 %
Lymphocytes Relative: 27 %
Lymphs Abs: 1.2 10*3/uL (ref 0.7–4.0)
MCH: 33.1 pg (ref 26.0–34.0)
MCHC: 33.1 g/dL (ref 30.0–36.0)
MCV: 100 fL (ref 80.0–100.0)
Monocytes Absolute: 0.2 10*3/uL (ref 0.1–1.0)
Monocytes Relative: 5 %
Neutro Abs: 2.7 10*3/uL (ref 1.7–7.7)
Neutrophils Relative %: 62 %
Platelets: 110 10*3/uL — ABNORMAL LOW (ref 150–400)
RBC: 4.87 MIL/uL (ref 4.22–5.81)
RDW: 13.2 % (ref 11.5–15.5)
WBC: 4.3 10*3/uL (ref 4.0–10.5)
nRBC: 0 % (ref 0.0–0.2)

## 2023-05-14 LAB — C DIFFICILE QUICK SCREEN W PCR REFLEX
C Diff antigen: POSITIVE — AB
C Diff toxin: NEGATIVE

## 2023-05-14 LAB — MAGNESIUM: Magnesium: 2.3 mg/dL (ref 1.7–2.4)

## 2023-05-14 LAB — MRSA NEXT GEN BY PCR, NASAL: MRSA by PCR Next Gen: NOT DETECTED

## 2023-05-14 LAB — HEPATITIS PANEL, ACUTE
HCV Ab: NONREACTIVE
Hep A IgM: NONREACTIVE
Hep B C IgM: NONREACTIVE
Hepatitis B Surface Ag: NONREACTIVE

## 2023-05-14 MED ORDER — SODIUM CHLORIDE 0.9 % IV SOLN: INTRAVENOUS | Status: AC

## 2023-05-14 MED ORDER — CHLORHEXIDINE GLUCONATE CLOTH 2 % EX PADS
6.0000 | MEDICATED_PAD | Freq: Every day | CUTANEOUS | Status: DC
  Administered 2023-05-14 – 2023-05-18 (×5): 6 via TOPICAL

## 2023-05-14 MED ORDER — IBUPROFEN 200 MG PO TABS
600.0000 mg | ORAL_TABLET | Freq: Three times a day (TID) | ORAL | Status: DC | PRN
  Administered 2023-05-14: 600 mg via ORAL
  Filled 2023-05-14: qty 3

## 2023-05-14 MED ORDER — DEXMEDETOMIDINE HCL IN NACL 200 MCG/50ML IV SOLN
0.0000 ug/kg/h | INTRAVENOUS | Status: AC
  Administered 2023-05-14: 0.4 ug/kg/h via INTRAVENOUS
  Administered 2023-05-15: 0.8 ug/kg/h via INTRAVENOUS
  Administered 2023-05-15 (×3): 1 ug/kg/h via INTRAVENOUS
  Filled 2023-05-14 (×5): qty 50

## 2023-05-14 MED ORDER — PIPERACILLIN-TAZOBACTAM 3.375 G IVPB
3.3750 g | Freq: Three times a day (TID) | INTRAVENOUS | Status: DC
  Administered 2023-05-14 – 2023-05-15 (×4): 3.375 g via INTRAVENOUS
  Filled 2023-05-14 (×4): qty 50

## 2023-05-14 MED ORDER — ORAL CARE MOUTH RINSE: 15.0000 mL | OROMUCOSAL | Status: DC | PRN

## 2023-05-14 NOTE — Progress Notes (Signed)
PROGRESS NOTE    Jermaine [name]  ZOX:[id number] DOB: Jan 26, 1966 DOA: 05/12/2023 PCP: Farris Has, MD   Brief Narrative:  HPI: Jermaine [name] is a 57 y.o. male with medical history significant of alcohol use disorder, EtOH withdrawal seizure, anxiety and depression who presents to the ED via EMS due to concern for EtOH withdrawal.  He was recently admitted for EtOH withdrawal from 12/4-12/6 but left AMA. He returned to the ED on 12/7 and was not found in significant withdrawal however psych did not think it was safe for patient to be admitted for detox due to his history of seizure and coma with EtOH withdrawals in the past.  He was discharged home on Librium which he states he took but had hallucinations while taking them. However, he is willing to try them again. 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 Reports drinking 1.5 case of white claw seltzer per day. He was found to be intoxicated on arrival but started going into withdrawal symptoms after more than 5 hours in the ED. He endorsed nervousness, anxiety and headache but denies any hallucinations, chest pain, shortness of breath or vision changes.   ED course: Initial vitals with temp 98.5, RR 20, HR 109, BP 117/91, SpO2 96% on room air Labs show sodium 137, K+ 3.6, bicarb 26, creatinine 1.02, AST/ALT 292/212, alk phos 47, T. bili 0.6, WBC 7.3, Hgb 17.1, platelet 173, ethanol level 374, mag 2.5, UDS positive for benzos and barbiturates, UA with ketonuria but no signs of infection. EKG shows normal sinus rhythm Patient received IV Ativan 2 mg x 1 and placed on CIWA protocol [facility] was consulted for admission  Assessment & Plan:   Principal Problem:   Alcohol withdrawal (HCC) Active Problems:   Alcohol abuse  EtOH abuse with active withdrawal Significant withdrawal, CIWA as high as 18 in last 24 hours but improved from previous 24 hours.  Received several  doses of as needed Ativan.  I was paged around 8:30 AM this morning that rapid response was called due to patient's CIWA jumping to 30.  I saw patient at the bedside right away.  I assessed his CIWA myself and found his CIWA to be 19.  Patient was alert and oriented.  We will continue Librium tapering protocol with as needed Ativan per CIWA protocol with multivitamins. He remains at risk of needing Ativan or Precedex drip, will monitor closely.  Acute colitis: After encounter with me, patient was complaining of abdominal pain.  Out of concern of acute pancreatitis secondary to alcoholism, lipase was checked which was normal, CT abdomen pelvis was then obtained which shows transverse colitis.  Patient is having some diarrhea as well.  Will check for C. difficile and GI pathogen panel and will start on Zosyn for acute colitis.  Alcoholic liver disease/liver lesion: Significant elevated LFTs which jumped significantly today.  CT completed 05/13/2023 shows fatty infiltration of the liver will check viral hepatitis panel.  Interval increase in size of hypodense liver lesion on the right lobe was noted on the CT abdomen pelvis done in August 2024, nonemergent liver protocol MRI was recommended.  We will do this inpatient once patient is stabilized.   # Anxiety and depression -Continue Lexapro  Nasal trauma: Has vertical nasal slate, says that he fell recently and this was glued after he sought medical attention.  Appears to be healing well.  Acute thrombocytopenia: Secondary to  alcoholism.  No signs of bleeding.  Hemoglobin stable.  Monitor.  DVT prophylaxis: enoxaparin (LOVENOX) injection 40 mg Start: 05/12/23 2000   Code Status: Full Code  Family Communication:  None present at bedside.  Plan of care discussed with patient in length and he/she verbalized understanding and agreed with it.  Status is: Inpatient Remains inpatient appropriate because: Still significant withdrawal with elevated  LFTs.     Estimated body mass index is 27.26 kg/m as calculated from the following:   Height as of this encounter: 5\' 10"  (1.778 m).   Weight as of this encounter: 86.2 kg.    Nutritional Assessment: Body mass index is 27.26 kg/m.Marland Kitchen Seen by dietician.  I agree with the assessment and plan as outlined below: Nutrition Status:        . Skin Assessment: I have examined the patient's skin and I agree with the wound assessment as performed by the wound care RN as outlined below:    Consultants:  None  Procedures:  None  Antimicrobials:  Anti-infectives (From admission, onward)    None         Subjective: Seen and examined.  Complains of anxiety, had significant obvious gross tremors.  Was complaining of headache, some tactile and visual hallucinations as well.  Despite of this, he was fully coherent and able to comprehend the necessity of remaining in the hospital so we can help him go through the withdrawal smoothly through medications.  He verbalized understanding.  I once again discussed with him and counseled him regarding any thoughts of leaving AGAINST MEDICAL ADVICE this time.  Patient's RN was at the bedside.  Objective: Vitals:   05/13/23 1706 05/13/23 2013 05/13/23 2245 05/14/23 0548  BP: (!) 140/93 (!) 141/85 (!) 127/90 134/86  Pulse: 87 93 88 78  Resp: 14 18  20   Temp: 98.6 F (37 C) 98.2 F (36.8 C)  97.8 F (36.6 C)  TempSrc: Oral Oral  Oral  SpO2: 97% 92%  96%  Weight:      Height:        Intake/Output Summary (Last 24 hours) at 05/14/2023 0752 Last data filed at 05/13/2023 1418 Gross per 24 hour  Intake 360 ml  Output --  Net 360 ml   Filed Weights   05/12/23 0908  Weight: 86.2 kg    Examination:  General exam: Appears anxious with cross upper extremity tremors. Respiratory system: Clear to auscultation. Respiratory effort normal. Cardiovascular system: S1 & S2 heard, RRR. No JVD, murmurs, rubs, gallops or clicks. No pedal  edema. Gastrointestinal system: Abdomen is nondistended, soft and nontender. No organomegaly or masses felt. Normal bowel sounds heard. Central nervous system: Alert and oriented. No focal neurological deficits. Extremities: Symmetric 5 x 5 power. Skin: No rashes, lesions or ulcers.  Psychiatry: Judgement and insight appear normal. Mood & affect appropriate.    Data Reviewed: I have personally reviewed following labs and imaging studies  CBC: Recent Labs  Lab 05/12/23 0956 05/14/23 0530  WBC 7.3 4.3  NEUTROABS 4.5 2.7  HGB 17.1* 16.1  HCT 51.3 48.7  MCV 97.9 100.0  PLT 173 110*   Basic Metabolic Panel: Recent Labs  Lab 05/12/23 0956 05/13/23 0751 05/14/23 0530  NA 137 136 138  K 3.6 3.8 3.6  CL 92* 94* 96*  CO2 26 32 31  GLUCOSE 84 102* 82  BUN 11 17 11   CREATININE 1.02 1.01 0.87  CALCIUM 8.2* 8.0* 8.8*  MG 2.5*  --  2.3   GFR:  Estimated Creatinine Clearance: 96.7 mL/min (by C-G formula based on SCr of 0.87 mg/dL). Liver Function Tests: Recent Labs  Lab 05/12/23 0956 05/13/23 0751 05/14/23 0530  AST 292* 386* 566*  ALT 212* 289* 464*  ALKPHOS 47 46 61  BILITOT 0.6 1.1 0.8  PROT 7.5 6.2* 6.4*  ALBUMIN 4.8 4.1 3.8   Recent Labs  Lab 05/13/23 1714  LIPASE 47   No results for input(s): "AMMONIA" in the last 168 hours. Coagulation Profile: No results for input(s): "INR", "PROTIME" in the last 168 hours. Cardiac Enzymes: No results for input(s): "CKTOTAL", "CKMB", "CKMBINDEX", "TROPONINI" in the last 168 hours. BNP (last 3 results) No results for input(s): "PROBNP" in the last 8760 hours. HbA1C: No results for input(s): "HGBA1C" in the last 72 hours. CBG: No results for input(s): "GLUCAP" in the last 168 hours. Lipid Profile: No results for input(s): "CHOL", "HDL", "LDLCALC", "TRIG", "CHOLHDL", "LDLDIRECT" in the last 72 hours. Thyroid Function Tests: No results for input(s): "TSH", "T4TOTAL", "FREET4", "T3FREE", "THYROIDAB" in the last 72  hours. Anemia Panel: No results for input(s): "VITAMINB12", "FOLATE", "FERRITIN", "TIBC", "IRON", "RETICCTPCT" in the last 72 hours. Sepsis Labs: No results for input(s): "PROCALCITON", "LATICACIDVEN" in the last 168 hours.  No results found for this or any previous visit (from the past 240 hours).   Radiology Studies: CT ABDOMEN PELVIS WO CONTRAST Result Date: 05/13/2023 CLINICAL DATA:  Acute abdominal pain EXAM: CT ABDOMEN AND PELVIS WITHOUT CONTRAST TECHNIQUE: Multidetector CT imaging of the abdomen and pelvis was performed following the standard protocol without IV contrast. RADIATION DOSE REDUCTION: This exam was performed according to the departmental dose-optimization program which includes automated exposure control, adjustment of the mA and/or kV according to patient size and/or use of iterative reconstruction technique. COMPARISON:  CT abdomen and pelvis 01/14/2023 FINDINGS: Lower chest: Twos 1 Hepatobiliary: There is diffuse fatty infiltration of the liver. Hypodense liver lesion measuring 6 mm image 2/24 has slightly decreased in size. No new liver lesions are identified. Gallbladder and bile ducts are within normal limits. Pancreas: Unremarkable. No pancreatic ductal dilatation or surrounding inflammatory changes. Spleen: Normal in size without focal abnormality. Adrenals/Urinary Tract: Adrenal glands are unremarkable. Kidneys are normal, without renal calculi, focal lesion, or hydronephrosis. Bladder is unremarkable. Stomach/Bowel: Stomach is within normal limits. Appendix appears normal. There some wall thickening and inflammatory stranding involving the transverse colon worrisome for colitis. There is descending and sigmoid colon diverticulosis. No dilated bowel loops are seen. Vascular/Lymphatic: Aortic atherosclerosis. No enlarged abdominal or pelvic lymph nodes. Reproductive: Prostate is unremarkable. Other: There are small fat containing bilateral inguinal hernias. Subcutaneous  hyperdensity and air in the lower left anterior abdominal wall is likely related to medication injection site. There is no ascites. Musculoskeletal: No acute or significant osseous findings. IMPRESSION: 1. Findings compatible with colitis involving the transverse colon. 2. Fatty infiltration of the liver. 3. Colonic diverticulosis. 4. Hypodense lesion in the liver has slightly decreased in size. This is indeterminate and can be further characterized with MRI as clinically warranted. Aortic Atherosclerosis (ICD10-I70.0). Electronically Signed   By: Darliss Cheney M.D.   On: 05/13/2023 23:08    Scheduled Meds:  azelastine  1 spray Each Nare BID   chlordiazePOXIDE  25 mg Oral TID   Followed by   chlordiazePOXIDE  25 mg Oral BH-qamhs   Followed by   Melene Muller ON 05/15/2023] chlordiazePOXIDE  25 mg Oral Daily   enoxaparin (LOVENOX) injection  40 mg Subcutaneous Q24H   escitalopram  20 mg Oral Daily  folic acid  1 mg Oral Daily   multivitamin with minerals  1 tablet Oral Daily   thiamine  100 mg Oral Daily   Or   thiamine  100 mg Intravenous Daily   Continuous Infusions:   LOS: 1 day   Hughie Closs, MD [facility]  05/14/2023, 7:52 AM   *Please note that this is a verbal dictation therefore any spelling or grammatical errors are due to the "Dragon [employer] One" system interpretation.  Please page via Amion and do not message via secure chat for urgent patient care matters. Secure chat can be used for non urgent patient care matters.  How to contact the [facility] Attending or Consulting provider 7A - 7P or covering provider during after hours 7P -7A, for this patient?  Check the care team in [facility] and look for a) attending/consulting [facility] provider listed and b) the [facility] team listed. Page or secure chat 7A-7P. Log into [email or web address] and use [facility]'s universal password to access. If you do not have the password, please contact the hospital operator. Locate the [facility] provider you are looking for  under [facility] and page to a number that you can be directly reached. If you still have difficulty reaching the provider, please page the [facility] (Director on Call) for the Hospitalists listed on amion for assistance.

## 2023-05-14 NOTE — Plan of Care (Signed)
  Problem: Education: Goal: Knowledge of General Education information will improve Description: Including pain rating scale, medication(s)/side effects and non-pharmacologic comfort measures Outcome: Progressing   Problem: Health Behavior/Discharge Planning: Goal: Ability to manage health-related needs will improve Outcome: Progressing   Problem: Clinical Measurements: Goal: Ability to maintain clinical measurements within normal limits will improve Outcome: Progressing Goal: Will remain free from infection Outcome: Progressing Goal: Diagnostic test results will improve Outcome: Progressing   Problem: Activity: Goal: Risk for activity intolerance will decrease Outcome: Progressing   Problem: Nutrition: Goal: Adequate nutrition will be maintained Outcome: Progressing   Problem: Coping: Goal: Level of anxiety will decrease Outcome: Progressing   Problem: Elimination: Goal: Will not experience complications related to bowel motility Outcome: Progressing   Problem: Safety: Goal: Ability to remain free from injury will improve Outcome: Progressing   Problem: Clinical Measurements: Goal: Respiratory complications will improve Outcome: Adequate for Discharge Goal: Cardiovascular complication will be avoided Outcome: Adequate for Discharge   Problem: Elimination: Goal: Will not experience complications related to urinary retention Outcome: Adequate for Discharge   Problem: Pain Management: Goal: General experience of comfort will improve Outcome: Adequate for Discharge   Problem: Skin Integrity: Goal: Risk for impaired skin integrity will decrease Outcome: Adequate for Discharge

## 2023-05-14 NOTE — Progress Notes (Signed)
Pharmacy Antibiotic Note  Jermaine [name] is a 57 y.o. male admitted on 05/12/2023 with EtOH withdrawal. Pharmacy has been consulted for Zosyn dosing for colitis.   Plan: Zosyn 3.375g IV q8h (each dose infused over 4 hours)  Need for further dosage adjustment appears unlikely at present, so pharmacy will sign off at this time.  Please reconsult if a change in clinical status warrants re-evaluation of dosage.     Height: 5\' 10"  (177.8 cm) Weight: 86.2 kg (190 lb) IBW/kg (Calculated) : 73  Temp (24hrs), Avg:98.2 F (36.8 C), Min:97.8 F (36.6 C), Max:98.6 F (37 C)  Recent Labs  Lab 05/12/23 0956 05/13/23 0751 05/14/23 0530  WBC 7.3  --  4.3  CREATININE 1.02 1.01 0.87    Estimated Creatinine Clearance: 96.7 mL/min (by C-G formula based on SCr of 0.87 mg/dL).    Allergies  Allergen Reactions   Ketamine     Hx of causing fever in 2024       Thank you for allowing pharmacy to be a part of this patient's care.   Greer Pickerel, PharmD, BCPS Clinical Pharmacist 05/14/2023 7:55 AM

## 2023-05-14 NOTE — Progress Notes (Signed)
       Overnight   NAME: Atom Herrera MRN: [id number] DOB : 07/10/1965    Date of Service   05/14/2023   HPI/Events of Note    Notified by RN for continued elevated CIWA score.  CIWA currently greater than 20 in spite of multiple administrations of IV Ativan.  57 year old male medical history significant of EtOH use disorder, EtOH withdrawal seizures, anxiety and depression admitted through the ER via EMS due to concern for EtOH withdrawal. Recently admitted for EtOH withdrawal from 12/4 to 12/6 but left AMA. He was originally discharged home on Librium which he states he took and had hallucinations while taking. Last stated drink was 0600 hrs. on 12/17. He reports drinking 1.5 cases of white claw seltzer per day.   Latest Reference Range & Units 05/12/23 09:56  Alcohol, Ethyl (B) <10 mg/dL 425 (HH)  (HH): Data is critically high   Latest Reference Range & Units 05/12/23 15:04  Amphetamines NONE DETECTED  NONE DETECTED  Barbiturates NONE DETECTED  POSITIVE !  Benzodiazepines NONE DETECTED  POSITIVE !  Opiates NONE DETECTED  NONE DETECTED  COCAINE NONE DETECTED  NONE DETECTED  Tetrahydrocannabinol NONE DETECTED  NONE DETECTED  !: Data is abnormal   Patient is experiencing elevated CIWA scores greater than 20 with outbursts of removing medical devices and intermittent confusion. Due to his known risk of seizures with withdrawal, Precedex is started as has been previously required.   Interventions/ Plan   Start Precedex Monitor QTc continuously in room Continue all previous attending orders.      Chinita Greenland BSN MSNA MSN ACNPC-AG Acute Care Nurse Practitioner [facility]

## 2023-05-14 NOTE — Progress Notes (Signed)
Mobility Specialist - Progress Note   05/14/23 0937  Mobility  Activity Ambulated with assistance in hallway  Level of Assistance Contact guard assist, steadying assist  Assistive Device Other (Comment) (IV Pole)  Distance Ambulated (ft) 600 ft  Activity Response Tolerated well  Mobility Referral Yes  Mobility visit 1 Mobility  Mobility Specialist Start Time (ACUTE ONLY) 0920  Mobility Specialist Stop Time (ACUTE ONLY) [id number]  Mobility Specialist Time Calculation (min) (ACUTE ONLY) 17 min   Pt received in bed and agreeable to mobility. No complaints during session. Pt to recliner after session with all needs met. Chair alarm on.   [facility]

## 2023-05-15 DIAGNOSIS — F10930 Alcohol use, unspecified with withdrawal, uncomplicated: Secondary | ICD-10-CM | POA: Diagnosis not present

## 2023-05-15 DIAGNOSIS — R001 Bradycardia, unspecified: Secondary | ICD-10-CM | POA: Diagnosis not present

## 2023-05-15 LAB — COMPREHENSIVE METABOLIC PANEL
ALT: 556 U/L — ABNORMAL HIGH (ref 0–44)
AST: 563 U/L — ABNORMAL HIGH (ref 15–41)
Albumin: 3.8 g/dL (ref 3.5–5.0)
Alkaline Phosphatase: 49 U/L (ref 38–126)
Anion gap: 12 (ref 5–15)
BUN: 11 mg/dL (ref 6–20)
CO2: 24 mmol/L (ref 22–32)
Calcium: 8.8 mg/dL — ABNORMAL LOW (ref 8.9–10.3)
Chloride: 100 mmol/L (ref 98–111)
Creatinine, Ser: 0.86 mg/dL (ref 0.61–1.24)
GFR, Estimated: >60 mL/min (ref >60)
Glucose, Bld: 112 mg/dL — ABNORMAL HIGH (ref 70–99)
Potassium: 4 mmol/L (ref 3.5–5.1)
Sodium: 136 mmol/L (ref 135–145)
Total Bilirubin: 0.8 mg/dL (ref <1.2)
Total Protein: 6.4 g/dL — ABNORMAL LOW (ref 6.5–8.1)

## 2023-05-15 LAB — CBC WITH DIFFERENTIAL/PLATELET
Abs Immature Granulocytes: 0.09 10*3/uL — ABNORMAL HIGH (ref 0.00–0.07)
Basophils Absolute: 0 10*3/uL (ref 0.0–0.1)
Basophils Relative: 1 %
Eosinophils Absolute: 0.2 10*3/uL (ref 0.0–0.5)
Eosinophils Relative: 4 %
HCT: 47.6 % (ref 39.0–52.0)
Hemoglobin: 15.6 g/dL (ref 13.0–17.0)
Immature Granulocytes: 2 %
Lymphocytes Relative: 24 %
Lymphs Abs: 1.2 10*3/uL (ref 0.7–4.0)
MCH: 33.5 pg (ref 26.0–34.0)
MCHC: 32.8 g/dL (ref 30.0–36.0)
MCV: 102.1 fL — ABNORMAL HIGH (ref 80.0–100.0)
Monocytes Absolute: 0.3 10*3/uL (ref 0.1–1.0)
Monocytes Relative: 6 %
Neutro Abs: 3.3 10*3/uL (ref 1.7–7.7)
Neutrophils Relative %: 63 %
Platelets: 113 10*3/uL — ABNORMAL LOW (ref 150–400)
RBC: 4.66 MIL/uL (ref 4.22–5.81)
RDW: 13.3 % (ref 11.5–15.5)
WBC: 5.1 10*3/uL (ref 4.0–10.5)
nRBC: 0 % (ref 0.0–0.2)

## 2023-05-15 LAB — GLUCOSE, CAPILLARY
Glucose-Capillary: 84 mg/dL (ref 70–99)
Glucose-Capillary: 90 mg/dL (ref 70–99)
Glucose-Capillary: 98 mg/dL (ref 70–99)

## 2023-05-15 LAB — GASTROINTESTINAL PANEL BY PCR, STOOL (REPLACES STOOL CULTURE)

## 2023-05-15 LAB — AMMONIA: Ammonia: 42 umol/L — ABNORMAL HIGH (ref 9–35)

## 2023-05-15 LAB — CLOSTRIDIUM DIFFICILE BY PCR, REFLEXED: Toxigenic C. Difficile by PCR: POSITIVE — AB

## 2023-05-15 MED ORDER — PHENOBARBITAL SODIUM 65 MG/ML IJ SOLN: 32.5000 mg | Freq: Three times a day (TID) | INTRAMUSCULAR | Status: DC

## 2023-05-15 MED ORDER — PHENOBARBITAL SODIUM 130 MG/ML IJ SOLN: 97.5000 mg | Freq: Three times a day (TID) | INTRAMUSCULAR | Status: DC

## 2023-05-15 MED ORDER — DEXMEDETOMIDINE HCL IN NACL 400 MCG/100ML IV SOLN
0.0000 ug/kg/h | INTRAVENOUS | Status: AC
  Administered 2023-05-15: 1 ug/kg/h via INTRAVENOUS
  Filled 2023-05-15: qty 100

## 2023-05-15 MED ORDER — PHENOBARBITAL SODIUM 130 MG/ML IJ SOLN
130.0000 mg | Freq: Once | INTRAMUSCULAR | Status: AC
  Administered 2023-05-15: 130 mg via INTRAVENOUS
  Filled 2023-05-15: qty 1

## 2023-05-15 MED ORDER — CLONIDINE HCL 0.1 MG PO TABS
0.1000 mg | ORAL_TABLET | Freq: Three times a day (TID) | ORAL | Status: DC
  Administered 2023-05-15 – 2023-05-16 (×4): 0.1 mg via ORAL
  Filled 2023-05-15 (×5): qty 1

## 2023-05-15 MED ORDER — PHENOBARBITAL SODIUM 130 MG/ML IJ SOLN
130.0000 mg | Freq: Once | INTRAMUSCULAR | Status: AC
  Administered 2023-05-15: 130 mg via INTRAVENOUS

## 2023-05-15 MED ORDER — PHENOBARBITAL SODIUM 65 MG/ML IJ SOLN
65.0000 mg | Freq: Two times a day (BID) | INTRAMUSCULAR | Status: DC
  Administered 2023-05-17 – 2023-05-18 (×3): 65 mg via INTRAVENOUS
  Filled 2023-05-15 (×3): qty 1

## 2023-05-15 MED ORDER — PHENOBARBITAL SODIUM 65 MG/ML IJ SOLN: 65.0000 mg | Freq: Three times a day (TID) | INTRAMUSCULAR | Status: DC

## 2023-05-15 MED ORDER — PHENOBARBITAL SODIUM 130 MG/ML IJ SOLN
130.0000 mg | Freq: Two times a day (BID) | INTRAMUSCULAR | Status: DC
  Administered 2023-05-16 – 2023-05-17 (×3): 130 mg via INTRAVENOUS
  Filled 2023-05-15 (×4): qty 1

## 2023-05-15 MED ORDER — VANCOMYCIN HCL 125 MG PO CAPS
125.0000 mg | ORAL_CAPSULE | Freq: Four times a day (QID) | ORAL | Status: DC
  Administered 2023-05-15 – 2023-05-19 (×15): 125 mg via ORAL
  Filled 2023-05-15 (×19): qty 1

## 2023-05-15 NOTE — Plan of Care (Signed)
  Problem: Clinical Measurements: Goal: Ability to maintain clinical measurements within normal limits will improve Outcome: Progressing Goal: Cardiovascular complication will be avoided Outcome: Progressing   Problem: Elimination: Goal: Will not experience complications related to urinary retention Outcome: Progressing   Problem: Pain Management: Goal: General experience of comfort will improve Outcome: Progressing   Problem: Safety: Goal: Ability to remain free from injury will improve Outcome: Progressing   Problem: Clinical Measurements: Goal: Diagnostic test results will improve Outcome: Not Progressing   Problem: Activity: Goal: Risk for activity intolerance will decrease Outcome: Not Progressing   Problem: Coping: Goal: Level of anxiety will decrease Outcome: Not Progressing   Problem: Elimination: Goal: Will not experience complications related to bowel motility Outcome: Not Progressing

## 2023-05-15 NOTE — Consult Note (Signed)
NAME:  Jermaine [name], MRN:  [id number], DOB:  12/28/1965, LOS: 2 ADMISSION DATE:  05/12/2023, CONSULTATION DATE:  12/18 REFERRING MD:  Jacqulyn Bath  CHIEF COMPLAINT:  ETOH Withdrawals   History of Present Illness:  Pt is a 57 yr old male with significant past medical hx of alcohol abuse, ETOH withdrawals with seizures, depression, anxiety and recent admission for ETOH withdrawals on 12/4 -12/6 but left AMA. However, patient returned back on 12/7 for withdrawal symptoms and placed on librium taper per psych recommendations due to concerns of developing seizures during detox. Patient returns back to [facility] ED on 12/15 by EMS with evidence of intoxication and beginning stages of ETOH withdrawals. Per chart, patient was self discontinued librium taper due to hallucinations and reported drinking 1.5 case of white claws/day. Patient was restarted on librium taper and CIWA protocol with ativan but despite these interventions patient developed worsening withdrawals with elevated CIWA scores requiring a precedex gtt on the evening of 12/17. PCCM was consulted by [facility] to assist with managing ETOH withdrawals and precedex infusion.   Pertinent  Medical History   Past Medical History:  Diagnosis Date   Alcohol abuse    Anxiety    Class 1 obesity 09/14/2021   Depression      Significant Hospital Events: Including procedures, antibiotic start and stop dates in addition to other pertinent events   12/15 admitted with ETOH withdrawals, librium taper and CIWA protocol started 12/17 Worsening withdrawals, elevated CIWA scores, precedex started, PCCM consult  12/18 on precedex gtt   Interim History / Subjective:  Following commands, disoriented x 3, on precedex   Objective   Blood pressure (!) 165/113, pulse (!) 51, temperature 97.7 F (36.5 C), temperature source Oral, resp. rate 19, height 5\' 10"  (1.778 m), weight 86.2 kg, SpO2 97%.        Intake/Output Summary (Last 24 hours) at 05/15/2023 1058 Last data  filed at 05/15/2023 0558 Gross per 24 hour  Intake 1101.09 ml  Output --  Net 1101.09 ml   Filed Weights   05/12/23 0908  Weight: 86.2 kg    Examination: General: disheveled middle aged male, sitting up in ICU bed HENT: Normocephalic, Poor dentition, Pink MM, healing scar on right nare to nasal bridge Lungs: clear/diminished throughout lung bases, no distress Cardiovascular: s1,s2, RRR, SB, no JVD, no MRG  Abdomen: BS active, soft  Extremities: moves all extremities, slight tremor Neuro: RASS-1 to -2, awakens to loud voice only oriented to self, disoriented to place/time  GU: intact   Resolved Hospital Problem list   N/a   Assessment & Plan:  Acute ETOH withdrawals  ETOH abuse disorder hx Patient drinks at least 1.5 case of white claws/day CIWA > 20 prior to precedex initiation Patient is currently RASS of -1 to -2  P: DC ativan, and librium  Wean precedex and once patient is less sedated will consider initiating phenobarb taper. Continue RASS goal of 0 to -1  Cdiff Collitis  Cdiff PCR positive  P:  On oral Vanc, may need to switch to IV antibiotics due mentation  Continue enteric precautions   Anxiety  Depression  P:  Hold lexapro, restart when appropriate   Best Practice  per primary    Labs   CBC: Recent Labs  Lab 05/12/23 0956 05/14/23 0530 05/15/23 0308  WBC 7.3 4.3 5.1  NEUTROABS 4.5 2.7 3.3  HGB 17.1* 16.1 15.6  HCT 51.3 48.7 47.6  MCV 97.9 100.0 102.1*  PLT 173 110* 113*  Basic Metabolic Panel: Recent Labs  Lab 05/12/23 0956 05/13/23 0751 05/14/23 0530 05/15/23 0308  NA 137 136 138 136  K 3.6 3.8 3.6 4.0  CL 92* 94* 96* 100  CO2 26 32 31 24  GLUCOSE 84 102* 82 112*  BUN 11 17 11 11   CREATININE 1.02 1.01 0.87 0.86  CALCIUM 8.2* 8.0* 8.8* 8.8*  MG 2.5*  --  2.3  --    GFR: Estimated Creatinine Clearance: 97.9 mL/min (by C-G formula based on SCr of 0.86 mg/dL). Recent Labs  Lab 05/12/23 0956 05/14/23 0530 05/15/23 0308  WBC  7.3 4.3 5.1    Liver Function Tests: Recent Labs  Lab 05/12/23 0956 05/13/23 0751 05/14/23 0530 05/15/23 0308  AST 292* 386* 566* 563*  ALT 212* 289* 464* 556*  ALKPHOS 47 46 61 49  BILITOT 0.6 1.1 0.8 0.8  PROT 7.5 6.2* 6.4* 6.4*  ALBUMIN 4.8 4.1 3.8 3.8   Recent Labs  Lab 05/13/23 1714  LIPASE 47   Recent Labs  Lab 05/15/23 0308  AMMONIA 42*    ABG    Component Value Date/Time   PHART 7.4 01/18/2023 0950   PCO2ART 38 01/18/2023 0950   PO2ART 96 01/18/2023 0950   HCO3 23.5 01/18/2023 0950   ACIDBASEDEF 1.1 01/18/2023 0950   O2SAT 99.5 01/18/2023 0950     Coagulation Profile: No results for input(s): "INR", "PROTIME" in the last 168 hours.  Cardiac Enzymes: No results for input(s): "CKTOTAL", "CKMB", "CKMBINDEX", "TROPONINI" in the last 168 hours.  HbA1C: Hgb A1c MFr Bld  Date/Time Value Ref Range Status  01/16/2023 02:57 AM 5.4 4.8 - 5.6 % Final    Comment:    (NOTE) Pre diabetes:          5.7%-6.4%  Diabetes:              >6.4%  Glycemic control for   <7.0% adults with diabetes   03/20/2019 03:46 AM 5.4 4.8 - 5.6 % Final    Comment:    (NOTE) Pre diabetes:          5.7%-6.4% Diabetes:              >6.4% Glycemic control for   <7.0% adults with diabetes     CBG: No results for input(s): "GLUCAP" in the last 168 hours.  Review of Systems:   See HPI, patient disoriented not able to fully assess   Past Medical History:  He,  has a past medical history of Alcohol abuse, Anxiety, Class 1 obesity (09/14/2021), and Depression.   Surgical History:   Past Surgical History:  Procedure Laterality Date   HERNIA REPAIR       Social History:   reports that he has never smoked. He has never used smokeless tobacco. He reports current alcohol use of about 20.0 standard drinks of alcohol per week. He reports that he does not currently use drugs.   Family History:  His family history is not on file. He was adopted.   Allergies Allergies   Allergen Reactions   Ketamine     Hx of causing fever in 2024      Home Medications  Prior to Admission medications   Medication Sig Start Date End Date Taking? Authorizing Provider  escitalopram (LEXAPRO) 20 MG tablet Take 20 mg by mouth daily. 02/27/19  Yes [provider]  folic acid (FOLVITE) 1 MG tablet Take 1 tablet (1 mg total) by mouth daily. 01/28/23  Yes Danford, Earl Lites, MD  hydrOXYzine (ATARAX) 25 MG tablet Take 1 tablet (25 mg total) by mouth 3 (three) times daily as needed for itching or anxiety. 01/28/23  Yes Danford, Earl Lites, MD  Multiple Vitamin (MULTIVITAMIN WITH MINERALS) TABS tablet Take 1 tablet by mouth daily. 05/20/22  Yes [name], MD  thiamine (VITAMIN B-1) 100 MG tablet Take 1 tablet (100 mg total) by mouth daily. 05/20/22  Yes [name], MD  chlordiazePOXIDE (LIBRIUM) 25 MG capsule 50mg  PO TID x 1D, then 25-50mg  PO BID X 1D, then 25-50mg  PO QD X 1D Patient not taking: Reported on 05/13/2023 05/04/23   Derwood Kaplan, MD  clindamycin (CLEOCIN) 300 MG capsule Take 300 mg by mouth 3 (three) times daily. Patient not taking: Reported on 05/13/2023    [provider]  naltrexone (DEPADE) 50 MG tablet Take 1 tablet (50 mg total) by mouth daily. Patient not taking: Reported on 05/02/2023 01/28/23   Alberteen Sam, MD     Critical care time: 35 min    Hazel Sams AGACNP-BC   [facility] 05/15/2023, 11:57 AM  Please see [email or web address] for pager details.  From 7A-7P if no response, please call [phone]. After hours, please call ELink [phone].

## 2023-05-15 NOTE — Progress Notes (Signed)
ETOH Withdrawal   Patient transitioned off precedex gtt while bridging onto phenobarbital taper  Patient will have received 130 mg IV phenobarb x 3 doses by 2200. Patient on continuous ETCO2 to monitor.  Starting 12/19 patient will receive 130mg  BID and then 12/20 will have 65mg  BID.  If patient is able to tolerate PO meds tomorrow, can transition phenobarb IV to PO route.   Hazel Sams AGACNP-BC   [facility] 05/15/2023, 5:48 PM  Please see [email or web address] for pager details.  From 7A-7P if no response, please call [phone]. After hours, please call ELink [phone].

## 2023-05-15 NOTE — Plan of Care (Signed)
  Problem: Education: Goal: Knowledge of General Education information will improve Description: Including pain rating scale, medication(s)/side effects and non-pharmacologic comfort measures Outcome: Not Progressing   Problem: Activity: Goal: Risk for activity intolerance will decrease Outcome: Not Progressing   Problem: Nutrition: Goal: Adequate nutrition will be maintained Outcome: Not Progressing   

## 2023-05-15 NOTE — Progress Notes (Addendum)
PROGRESS NOTE    Jermaine [name]  WUJ:[id number] DOB: [birth year] DOA: 05/12/2023 PCP: Farris Has, MD   Brief Narrative:  Jermaine [name] is a 57 y.o. male with medical history significant of alcohol use disorder, EtOH withdrawal seizure, anxiety and depression who presents to the ED via EMS due to concern for EtOH withdrawal.  He was recently admitted for EtOH withdrawal from 12/4-12/6 but left AMA. He returned to the ED on 12/7 and was not found in significant withdrawal however psych did not think it was safe for patient to be admitted for detox due to his history of seizure and coma with EtOH withdrawals in the past.  He was discharged home on Librium which he states he took but had hallucinations while taking them. Reports drinking 1.5 case of white claw seltzer per day and last drink was around 6 AM on the day of ED presentation. He was found to be intoxicated on arrival but started going into withdrawal symptoms after more than 5 hours in the ED. admitted under hospital service, managed on the floor with Librium protocol and as needed Ativan however early morning of 05/15/2023, he continued to have higher CIWA over 20, not manageable with IV Ativan, was eventually transferred to ICU/stepdown and started on Precedex drip.  PCCM consulted.  Assessment & Plan:   Principal Problem:   Alcohol withdrawal (HCC) Active Problems:   Alcohol abuse  EtOH abuse with active withdrawal Was started on Librium protocol with as needed Ativan, early morning total 1824, had significant withdrawal with CIWA persistently elevated over 20 despite of multiple doses of IV Ativan, was transferred to stepdown unit and started on Precedex drip.  Patient did not wake up for me due to being on Precedex drip.  I have consulted PCCM.  Acute C. difficile colitis: CT abdomen was obtained due to complaint of abdominal pain which showed colitis.  Patient was started on Zosyn while C. difficile is pending.  C. difficile  positive evening of 05/14/2023.  Started on vancomycin and Zosyn discontinued.  Alcoholic liver disease/liver lesion: Significant elevated LFTs which jumped significantly yesterday but stable today.  CT completed 05/13/2023 shows fatty infiltration of the liver, viral hepatitis panel negative.  Interval increase in size of hypodense liver lesion on the right lobe was noted on the CT abdomen pelvis done in August 2024, nonemergent liver protocol MRI was recommended.  We will do this inpatient once patient is stabilized.   # Anxiety and depression -Continue Lexapro  Nasal trauma: Has vertical nasal slate, says that he fell recently and this was glued after he sought medical attention.  Appears to be healing well.  Acute thrombocytopenia: Secondary to alcoholism.  No signs of bleeding.  Hemoglobin stable.  Monitor.  DVT prophylaxis: enoxaparin (LOVENOX) injection 40 mg Start: 05/12/23 2000   Code Status: Full Code  Family Communication:  None present at bedside.  Lengthy discussion with the sister, answered several questions and updated her about his current situation and expected length of stay.  Status is: Inpatient Remains inpatient appropriate because: Patient on Precedex drip  Estimated body mass index is 27.26 kg/m as calculated from the following:   Height as of this encounter: 5\' 10"  (1.778 m).   Weight as of this encounter: 86.2 kg.    Nutritional Assessment: Body mass index is 27.26 kg/m.Marland Kitchen Seen by dietician.  I agree with the assessment and plan as outlined below: Nutrition Status:        . Skin Assessment: I have examined the  patient's skin and I agree with the wound assessment as performed by the wound care RN as outlined below:    Consultants:  None  Procedures:  None  Antimicrobials:  Anti-infectives (From admission, onward)    Start     Dose/Rate Route Frequency Ordered Stop   05/15/23 1000  vancomycin (VANCOCIN) capsule 125 mg        125 mg Oral 4 times  daily 05/15/23 0741 05/25/23 0959   05/14/23 0800  piperacillin-tazobactam (ZOSYN) IVPB 3.375 g        3.375 g 12.5 mL/hr over 240 Minutes Intravenous Every 8 hours 05/14/23 0755           Subjective: Seen and examined in the stepdown unit.  Patient obtunded under the influence of Precedex drip.  Sitter at the bedside.  Objective: Vitals:   05/15/23 0500 05/15/23 0555 05/15/23 0600 05/15/23 0700  BP: (!) 160/109 (!) 162/119 (!) 165/113   Pulse: (!) 58 (!) 51 (!) 51   Resp:  20 19   Temp:    97.7 F (36.5 C)  TempSrc:    Oral  SpO2:  96% 97%   Weight:      Height:        Intake/Output Summary (Last 24 hours) at 05/15/2023 1138 Last data filed at 05/15/2023 0558 Gross per 24 hour  Intake 1101.09 ml  Output --  Net 1101.09 ml   Filed Weights   05/12/23 0908  Weight: 86.2 kg    Examination:  General exam: Appears obtunded but comfortable. Respiratory system: Clear to auscultation. Respiratory effort normal. Cardiovascular system: S1 & S2 heard, RRR. No JVD, murmurs, rubs, gallops or clicks. No pedal edema. Gastrointestinal system: Abdomen is nondistended, soft and nontender. No organomegaly or masses felt. Normal bowel sounds heard. Central nervous system: Obtunded but comfortable.  Unable to assess orientation.  Data Reviewed: I have personally reviewed following labs and imaging studies  CBC: Recent Labs  Lab 05/12/23 0956 05/14/23 0530 05/15/23 0308  WBC 7.3 4.3 5.1  NEUTROABS 4.5 2.7 3.3  HGB 17.1* 16.1 15.6  HCT 51.3 48.7 47.6  MCV 97.9 100.0 102.1*  PLT 173 110* 113*   Basic Metabolic Panel: Recent Labs  Lab 05/12/23 0956 05/13/23 0751 05/14/23 0530 05/15/23 0308  NA 137 136 138 136  K 3.6 3.8 3.6 4.0  CL 92* 94* 96* 100  CO2 26 32 31 24  GLUCOSE 84 102* 82 112*  BUN 11 17 11 11   CREATININE 1.02 1.01 0.87 0.86  CALCIUM 8.2* 8.0* 8.8* 8.8*  MG 2.5*  --  2.3  --    GFR: Estimated Creatinine Clearance: 97.9 mL/min (by C-G formula based on  SCr of 0.86 mg/dL). Liver Function Tests: Recent Labs  Lab 05/12/23 0956 05/13/23 0751 05/14/23 0530 05/15/23 0308  AST 292* 386* 566* 563*  ALT 212* 289* 464* 556*  ALKPHOS 47 46 61 49  BILITOT 0.6 1.1 0.8 0.8  PROT 7.5 6.2* 6.4* 6.4*  ALBUMIN 4.8 4.1 3.8 3.8   Recent Labs  Lab 05/13/23 1714  LIPASE 47   Recent Labs  Lab 05/15/23 0308  AMMONIA 42*   Coagulation Profile: No results for input(s): "INR", "PROTIME" in the last 168 hours. Cardiac Enzymes: No results for input(s): "CKTOTAL", "CKMB", "CKMBINDEX", "TROPONINI" in the last 168 hours. BNP (last 3 results) No results for input(s): "PROBNP" in the last 8760 hours. HbA1C: No results for input(s): "HGBA1C" in the last 72 hours. CBG: No results for input(s): "GLUCAP" in the  last 168 hours. Lipid Profile: No results for input(s): "CHOL", "HDL", "LDLCALC", "TRIG", "CHOLHDL", "LDLDIRECT" in the last 72 hours. Thyroid Function Tests: No results for input(s): "TSH", "T4TOTAL", "FREET4", "T3FREE", "THYROIDAB" in the last 72 hours. Anemia Panel: No results for input(s): "VITAMINB12", "FOLATE", "FERRITIN", "TIBC", "IRON", "RETICCTPCT" in the last 72 hours. Sepsis Labs: No results for input(s): "PROCALCITON", "LATICACIDVEN" in the last 168 hours.  Recent Results (from the past 240 hours)  C Difficile Quick Screen w PCR reflex     Status: Abnormal   Collection Time: 05/14/23  6:19 PM   Specimen: STOOL  Result Value Ref Range Status   C Diff antigen POSITIVE (A) NEGATIVE Final   C Diff toxin NEGATIVE NEGATIVE Final   C Diff interpretation Results are indeterminate. See PCR results.  Final    Comment: Performed at [facility], [address], [place], [place] [address]  C. Diff by PCR, Reflexed     Status: Abnormal   Collection Time: 05/14/23  6:19 PM  Result Value Ref Range Status   Toxigenic C. Difficile by PCR POSITIVE (A) NEGATIVE Final    Comment: Positive for toxigenic C. difficile with little  to no toxin production. Only treat if clinical presentation suggests symptomatic illness. Performed at [facility], [address], [place], [place] [address]   MRSA Next Gen by PCR, Nasal     Status: None   Collection Time: 05/14/23  7:31 PM   Specimen: Nasal Mucosa; Nasal Swab  Result Value Ref Range Status   MRSA by PCR Next Gen NOT DETECTED NOT DETECTED Final    Comment: (NOTE) The GeneXpert MRSA Assay (FDA approved for NASAL specimens only), is one component of a comprehensive MRSA colonization surveillance program. It is not intended to diagnose MRSA infection nor to guide or monitor treatment for MRSA infections. Test performance is not FDA approved in patients less than 29 years old. Performed at [facility], [address] [place], [place] [address]      Radiology Studies: CT ABDOMEN PELVIS WO CONTRAST Result Date: 05/13/2023 CLINICAL DATA:  Acute abdominal pain EXAM: CT ABDOMEN AND PELVIS WITHOUT CONTRAST TECHNIQUE: Multidetector CT imaging of the abdomen and pelvis was performed following the standard protocol without IV contrast. RADIATION DOSE REDUCTION: This exam was performed according to the departmental dose-optimization program which includes automated exposure control, adjustment of the mA and/or kV according to patient size and/or use of iterative reconstruction technique. COMPARISON:  CT abdomen and pelvis 01/14/2023 FINDINGS: Lower chest: Twos 1 Hepatobiliary: There is diffuse fatty infiltration of the liver. Hypodense liver lesion measuring 6 mm image 2/24 has slightly decreased in size. No new liver lesions are identified. Gallbladder and bile ducts are within normal limits. Pancreas: Unremarkable. No pancreatic ductal dilatation or surrounding inflammatory changes. Spleen: Normal in size without focal abnormality. Adrenals/Urinary Tract: Adrenal glands are unremarkable. Kidneys are normal, without renal calculi, focal lesion, or  hydronephrosis. Bladder is unremarkable. Stomach/Bowel: Stomach is within normal limits. Appendix appears normal. There some wall thickening and inflammatory stranding involving the transverse colon worrisome for colitis. There is descending and sigmoid colon diverticulosis. No dilated bowel loops are seen. Vascular/Lymphatic: Aortic atherosclerosis. No enlarged abdominal or pelvic lymph nodes. Reproductive: Prostate is unremarkable. Other: There are small fat containing bilateral inguinal hernias. Subcutaneous hyperdensity and air in the lower left anterior abdominal wall is likely related to medication injection site. There is no ascites. Musculoskeletal: No acute or significant osseous findings. IMPRESSION: 1. Findings compatible  with colitis involving the transverse colon. 2. Fatty infiltration of the liver. 3. Colonic diverticulosis. 4. Hypodense lesion in the liver has slightly decreased in size. This is indeterminate and can be further characterized with MRI as clinically warranted. Aortic Atherosclerosis (ICD10-I70.0). Electronically Signed   By: Darliss Cheney M.D.   On: 05/13/2023 23:08    Scheduled Meds:  azelastine  1 spray Each Nare BID   chlordiazePOXIDE  25 mg Oral Daily   Chlorhexidine Gluconate Cloth  6 each Topical Daily   cloNIDine  0.1 mg Oral TID   enoxaparin (LOVENOX) injection  40 mg Subcutaneous Q24H   escitalopram  20 mg Oral Daily   folic acid  1 mg Oral Daily   multivitamin with minerals  1 tablet Oral Daily   thiamine  100 mg Oral Daily   Or   thiamine  100 mg Intravenous Daily   vancomycin  125 mg Oral QID   Continuous Infusions:  sodium chloride 40 mL/hr at 05/15/23 0558   dexmedetomidine (PRECEDEX) IV infusion 1 mcg/kg/hr (05/15/23 1116)   piperacillin-tazobactam (ZOSYN)  IV 3.375 g (05/15/23 3875)     LOS: 2 days   Hughie Closs, MD [facility]  05/15/2023, 11:38 AM   *Please note that this is a verbal dictation therefore any spelling or grammatical  errors are due to the "Dragon [employer] One" system interpretation.  Please page via Amion and do not message via secure chat for urgent patient care matters. Secure chat can be used for non urgent patient care matters.  How to contact the [facility] Attending or Consulting provider 7A - 7P or covering provider during after hours 7P -7A, for this patient?  Check the care team in [facility] and look for a) attending/consulting TR[facility] provider listed and b) the [facility] team listed. Page or secure chat 7A-7P. Log into [email or web address] and use [facility] universal password to access. If you do not have the password, please contact the hospital operator. Locate the [facility] provider you are looking for under [facility] and page to a number that you can be directly reached. If you still have difficulty reaching the provider, please page the [facility] (Director on Call) for the Hospitalists listed on amion for assistance.

## 2023-05-16 ENCOUNTER — Telehealth (HOSPITAL_COMMUNITY): Payer: Self-pay

## 2023-05-16 ENCOUNTER — Other Ambulatory Visit (HOSPITAL_COMMUNITY): Payer: Self-pay

## 2023-05-16 DIAGNOSIS — F10931 Alcohol use, unspecified with withdrawal delirium: Secondary | ICD-10-CM | POA: Diagnosis not present

## 2023-05-16 LAB — GLUCOSE, CAPILLARY
Glucose-Capillary: 86 mg/dL (ref 70–99)
Glucose-Capillary: 83 mg/dL (ref 70–99)
Glucose-Capillary: 78 mg/dL (ref 70–99)
Glucose-Capillary: 85 mg/dL (ref 70–99)
Glucose-Capillary: 96 mg/dL (ref 70–99)
Glucose-Capillary: 160 mg/dL — ABNORMAL HIGH (ref 70–99)

## 2023-05-16 LAB — CBC WITH DIFFERENTIAL/PLATELET
Abs Immature Granulocytes: 0.11 10*3/uL — ABNORMAL HIGH (ref 0.00–0.07)
Basophils Absolute: 0 10*3/uL (ref 0.0–0.1)
Basophils Relative: 1 %
Eosinophils Absolute: 0.2 10*3/uL (ref 0.0–0.5)
Eosinophils Relative: 3 %
HCT: 50.9 % (ref 39.0–52.0)
Hemoglobin: 16.6 g/dL (ref 13.0–17.0)
Immature Granulocytes: 2 %
Lymphocytes Relative: 25 %
Lymphs Abs: 1.3 10*3/uL (ref 0.7–4.0)
MCH: 32.7 pg (ref 26.0–34.0)
MCHC: 32.6 g/dL (ref 30.0–36.0)
MCV: 100.4 fL — ABNORMAL HIGH (ref 80.0–100.0)
Monocytes Absolute: 0.4 10*3/uL (ref 0.1–1.0)
Monocytes Relative: 8 %
Neutro Abs: 3.2 10*3/uL (ref 1.7–7.7)
Neutrophils Relative %: 61 %
Platelets: 131 10*3/uL — ABNORMAL LOW (ref 150–400)
RBC: 5.07 MIL/uL (ref 4.22–5.81)
RDW: 13.1 % (ref 11.5–15.5)
WBC: 5.3 10*3/uL (ref 4.0–10.5)
nRBC: 0 % (ref 0.0–0.2)

## 2023-05-16 LAB — COMPREHENSIVE METABOLIC PANEL
ALT: 478 U/L — ABNORMAL HIGH (ref 0–44)
AST: 334 U/L — ABNORMAL HIGH (ref 15–41)
Albumin: 3.7 g/dL (ref 3.5–5.0)
Alkaline Phosphatase: 47 U/L (ref 38–126)
Anion gap: 9 (ref 5–15)
BUN: 9 mg/dL (ref 6–20)
CO2: 29 mmol/L (ref 22–32)
Calcium: 8.6 mg/dL — ABNORMAL LOW (ref 8.9–10.3)
Chloride: 98 mmol/L (ref 98–111)
Creatinine, Ser: 0.78 mg/dL (ref 0.61–1.24)
GFR, Estimated: >60 mL/min (ref >60)
Glucose, Bld: 84 mg/dL (ref 70–99)
Potassium: 3.9 mmol/L (ref 3.5–5.1)
Sodium: 136 mmol/L (ref 135–145)
Total Bilirubin: 0.8 mg/dL (ref <1.2)
Total Protein: 6.3 g/dL — ABNORMAL LOW (ref 6.5–8.1)

## 2023-05-16 MED ORDER — LORAZEPAM 2 MG/ML IJ SOLN
1.0000 mg | INTRAMUSCULAR | Status: DC | PRN
Start: 2023-05-16 — End: 2023-05-19
  Administered 2023-05-17 (×2): 4 mg via INTRAVENOUS
  Administered 2023-05-17: 2 mg via INTRAVENOUS
  Administered 2023-05-17: 4 mg via INTRAVENOUS
  Filled 2023-05-16: qty 1
  Filled 2023-05-16 (×3): qty 2

## 2023-05-16 MED ORDER — LORAZEPAM 1 MG PO TABS
1.0000 mg | ORAL_TABLET | ORAL | Status: DC | PRN
  Administered 2023-05-16 (×2): 3 mg via ORAL
  Administered 2023-05-16: 2 mg via ORAL
  Administered 2023-05-16 – 2023-05-17 (×2): 3 mg via ORAL
  Administered 2023-05-17 (×2): 1 mg via ORAL
  Administered 2023-05-17: 4 mg via ORAL
  Administered 2023-05-18 (×3): 2 mg via ORAL
  Administered 2023-05-18: 3 mg via ORAL
  Administered 2023-05-18: 2 mg via ORAL
  Administered 2023-05-18: 1 mg via ORAL
  Administered 2023-05-18: 3 mg via ORAL
  Administered 2023-05-18: 1 mg via ORAL
  Filled 2023-05-16: qty 2
  Filled 2023-05-16: qty 3
  Filled 2023-05-16: qty 1
  Filled 2023-05-16: qty 3
  Filled 2023-05-16 (×2): qty 1
  Filled 2023-05-16: qty 4
  Filled 2023-05-16: qty 2
  Filled 2023-05-16 (×2): qty 3
  Filled 2023-05-16: qty 1
  Filled 2023-05-16: qty 2
  Filled 2023-05-16 (×2): qty 3
  Filled 2023-05-16 (×2): qty 2

## 2023-05-16 NOTE — Progress Notes (Signed)
NAME:  Jermaine [name], MRN:  [id number], DOB:  01-Jan-1966, LOS: 3 ADMISSION DATE:  05/12/2023, CONSULTATION DATE:  12/18 REFERRING MD:  Jacqulyn Bath  CHIEF COMPLAINT:  ETOH Withdrawals   History of Present Illness:  Pt is a 57 yr old male with significant past medical hx of alcohol abuse, ETOH withdrawals with seizures, depression, anxiety and recent admission for ETOH withdrawals on 12/4 -12/6 but left AMA. However, patient returned back on 12/7 for withdrawal symptoms and placed on librium taper per psych recommendations due to concerns of developing seizures during detox. Patient returns back to [facility] ED on 12/15 by EMS with evidence of intoxication and beginning stages of ETOH withdrawals. Per chart, patient was self discontinued librium taper due to hallucinations and reported drinking 1.5 case of white claws/day. Patient was restarted on librium taper and CIWA protocol with ativan but despite these interventions patient developed worsening withdrawals with elevated CIWA scores requiring a precedex gtt on the evening of 12/17. PCCM was consulted by [facility] to assist with managing ETOH withdrawals and precedex infusion.   Pertinent  Medical History   Past Medical History:  Diagnosis Date   Alcohol abuse    Anxiety    Class 1 obesity 09/14/2021   Depression      Significant Hospital Events: Including procedures, antibiotic start and stop dates in addition to other pertinent events   12/15 admitted with ETOH withdrawals, librium taper and CIWA protocol started 12/17 Worsening withdrawals, elevated CIWA scores, precedex started, PCCM consult  12/18 on precedex gtt   Interim History / Subjective:  Following commands, disoriented x 3, on precedex   Objective   Blood pressure 123/84, pulse 68, temperature 98.9 F (37.2 C), temperature source Oral, resp. rate 18, height 5\' 10"  (1.778 m), weight 86.2 kg, SpO2 95%.        Intake/Output Summary (Last 24 hours) at 05/16/2023 6010 Last data filed at  05/16/2023 0116 Gross per 24 hour  Intake 554.57 ml  Output 1450 ml  Net -895.43 ml   Filed Weights   05/12/23 0908  Weight: 86.2 kg    Examination: General:  Adult male sitting upright in bed in NAD, jovial  HEENT: MM pink/moist, pupils 3/r, poor dentition, healing scar to right nare/ nasal bridge Neuro: Mildly fidgety, intermittent hand tremor,  oriented x3, but ?intermittent confusion, MAE CV: rr, NSR PULM:  non labored, clear anteriorly GI: soft, bs+, mild diffuse tenderness Extremities: warm/dry, no LE edema  Skin: no rashes or lesions  Improving LFTs  Resolved Hospital Problem list   N/a   Assessment & Plan:  Acute ETOH withdrawals  ETOH abuse disorder hx Patient drinks at least 1.5 case of white claws/day CIWA > 20 prior to precedex initiation 12/18 P: Off precedex 12/19 evening Continue CIWA/  phenobarbital taper Mentals status improving, remains hemodynamically stable Thiamine/ folate/ MVI Ongoing cessation counseling  Cdiff Collitis  Cdiff PCR positive  P:  Oral vanc enteric precautions   Anxiety  Depression  P:  - resume lexapro per primary   Nothing further to add.  PCCM will sign off.  Please call us back if we can be of any further assistance.  Best Practice  per primary    Labs   CBC: Recent Labs  Lab 05/12/23 0956 05/14/23 0530 05/15/23 0308 05/16/23 0259  WBC 7.3 4.3 5.1 5.3  NEUTROABS 4.5 2.7 3.3 3.2  HGB 17.1* 16.1 15.6 16.6  HCT 51.3 48.7 47.6 50.9  MCV 97.9 100.0 102.1* 100.4*  PLT 173 110*  113* 131*    Basic Metabolic Panel: Recent Labs  Lab 05/12/23 0956 05/13/23 0751 05/14/23 0530 05/15/23 0308 05/16/23 0259  NA 137 136 138 136 136  K 3.6 3.8 3.6 4.0 3.9  CL 92* 94* 96* 100 98  CO2 26 32 31 24 29   GLUCOSE 84 102* 82 112* 84  BUN 11 17 11 11 9   CREATININE 1.02 1.01 0.87 0.86 0.78  CALCIUM 8.2* 8.0* 8.8* 8.8* 8.6*  MG 2.5*  --  2.3  --   --    GFR: Estimated Creatinine Clearance: 105.2 mL/min (by C-G formula  based on SCr of 0.78 mg/dL). Recent Labs  Lab 05/12/23 0956 05/14/23 0530 05/15/23 0308 05/16/23 0259  WBC 7.3 4.3 5.1 5.3    Liver Function Tests: Recent Labs  Lab 05/12/23 0956 05/13/23 0751 05/14/23 0530 05/15/23 0308 05/16/23 0259  AST 292* 386* 566* 563* 334*  ALT 212* 289* 464* 556* 478*  ALKPHOS 47 46 61 49 47  BILITOT 0.6 1.1 0.8 0.8 0.8  PROT 7.5 6.2* 6.4* 6.4* 6.3*  ALBUMIN 4.8 4.1 3.8 3.8 3.7   Recent Labs  Lab 05/13/23 1714  LIPASE 47   Recent Labs  Lab 05/15/23 0308  AMMONIA 42*    ABG    Component Value Date/Time   PHART 7.4 01/18/2023 0950   PCO2ART 38 01/18/2023 0950   PO2ART 96 01/18/2023 0950   HCO3 23.5 01/18/2023 0950   ACIDBASEDEF 1.1 01/18/2023 0950   O2SAT 99.5 01/18/2023 0950     Coagulation Profile: No results for input(s): "INR", "PROTIME" in the last 168 hours.  Cardiac Enzymes: No results for input(s): "CKTOTAL", "CKMB", "CKMBINDEX", "TROPONINI" in the last 168 hours.  HbA1C: Hgb A1c MFr Bld  Date/Time Value Ref Range Status  01/16/2023 02:57 AM 5.4 4.8 - 5.6 % Final    Comment:    (NOTE) Pre diabetes:          5.7%-6.4%  Diabetes:              >6.4%  Glycemic control for   <7.0% adults with diabetes   03/20/2019 03:46 AM 5.4 4.8 - 5.6 % Final    Comment:    (NOTE) Pre diabetes:          5.7%-6.4% Diabetes:              >6.4% Glycemic control for   <7.0% adults with diabetes     CBG: Recent Labs  Lab 05/15/23 1516 05/15/23 2003 05/15/23 2352 05/16/23 0320 05/16/23 0754  GLUCAP 98 84 90 86 83    Past Medical History:  He,  has a past medical history of Alcohol abuse, Anxiety, Class 1 obesity (09/14/2021), and Depression.   Surgical History:   Past Surgical History:  Procedure Laterality Date   HERNIA REPAIR       Social History:   reports that he has never smoked. He has never used smokeless tobacco. He reports current alcohol use of about 20.0 standard drinks of alcohol per week. He reports  that he does not currently use drugs.   Family History:  His family history is not on file. He was adopted.   Allergies Allergies  Allergen Reactions   Ketamine     Hx of causing fever in 2024      Home Medications  Prior to Admission medications   Medication Sig Start Date End Date Taking? Authorizing Provider  escitalopram (LEXAPRO) 20 MG tablet Take 20 mg by mouth daily. 02/27/19  Yes [provider]  folic acid (FOLVITE) 1 MG tablet Take 1 tablet (1 mg total) by mouth daily. 01/28/23  Yes Danford, Earl Lites, MD  hydrOXYzine (ATARAX) 25 MG tablet Take 1 tablet (25 mg total) by mouth 3 (three) times daily as needed for itching or anxiety. 01/28/23  Yes Danford, Earl Lites, MD  Multiple Vitamin (MULTIVITAMIN WITH MINERALS) TABS tablet Take 1 tablet by mouth daily. 05/20/22  Yes [name], MD  thiamine (VITAMIN B-1) 100 MG tablet Take 1 tablet (100 mg total) by mouth daily. 05/20/22  Yes [name], MD  chlordiazePOXIDE (LIBRIUM) 25 MG capsule 50mg  PO TID x 1D, then 25-50mg  PO BID X 1D, then 25-50mg  PO QD X 1D Patient not taking: Reported on 05/13/2023 05/04/23   Derwood Kaplan, MD  clindamycin (CLEOCIN) 300 MG capsule Take 300 mg by mouth 3 (three) times daily. Patient not taking: Reported on 05/13/2023    [provider]  naltrexone (DEPADE) 50 MG tablet Take 1 tablet (50 mg total) by mouth daily. Patient not taking: Reported on 05/02/2023 01/28/23   Alberteen Sam, MD     Critical care time: n/a      Posey Boyer, MSN, AG-ACNP-BC [facility] 05/16/2023, 8:20 AM  See Amion for pager If no response to pager , please call [phone] until 7pm After 7:00 pm call Elink  [phone]

## 2023-05-16 NOTE — Plan of Care (Signed)

## 2023-05-16 NOTE — Telephone Encounter (Signed)
Pharmacy Patient Advocate Encounter  Insurance verification completed.    The patient is insured through [facility]     Ran test claim for Vancomycin and the current 30 day co-pay is $0.00.   This test claim was processed through [facility]- copay amounts may vary at other pharmacies due to pharmacy/plan contracts, or as the patient moves through the different stages of their insurance plan.

## 2023-05-16 NOTE — Progress Notes (Signed)
PROGRESS NOTE    Jermaine [name]  ZOX:[id number] DOB: May 07, 1966 DOA: 05/12/2023 PCP: Farris Has, MD   Brief Narrative:  Jermaine [name] is a 57 y.o. male with medical history significant of alcohol use disorder, EtOH withdrawal seizure, anxiety and depression who presents to the ED via EMS due to concern for EtOH withdrawal.  He was recently admitted for EtOH withdrawal from 12/4-12/6 but left AMA. He returned to the ED on 12/7 and was not found in significant withdrawal however psych did not think it was safe for patient to be admitted for detox due to his history of seizure and coma with EtOH withdrawals in the past.  He was discharged home on Librium which he states he took but had hallucinations while taking them. Reports drinking 1.5 case of white claw seltzer per day and last drink was around 6 AM on the day of ED presentation. He was found to be intoxicated on arrival but started going into withdrawal symptoms after more than 5 hours in the ED. admitted under hospital service, managed on the floor with Librium protocol and as needed Ativan however early morning of 05/15/2023, he continued to have higher CIWA over 20, not manageable with IV Ativan, was eventually transferred to ICU/stepdown and started on Precedex drip.  PCCM consulted.  Assessment & Plan:   Principal Problem:   Alcohol withdrawal (HCC) Active Problems:   Alcohol abuse  EtOH abuse with active withdrawal Was started on Librium protocol with as needed Ativan, early morning of 05/15/2023, he had significant withdrawal with CIWA persistently elevated over 20 despite of multiple doses of IV Ativan, was transferred to stepdown unit and started on Precedex drip.  PCCM consulted, he was then started on phenobarbital and eventually Precedex was discontinued.  Patient doing much better today.  Appreciate PCCM help.  Acute C. difficile colitis: CT abdomen was obtained due to complaint of abdominal pain which showed colitis.  Patient  was started on Zosyn while C. difficile was pending.  C. difficile eventually was positive evening of 05/14/2023, he was started on vancomycin and Zosyn discontinued.  Improving.  Alcoholic liver disease/liver lesion: LFTs improving.  CT completed 05/13/2023 shows fatty infiltration of the liver, viral hepatitis panel negative.  Interval increase in size of hypodense liver lesion on the right lobe was noted on the CT abdomen pelvis done in August 2024, nonemergent liver protocol MRI was recommended.  We will do this inpatient once patient is stabilized.   # Anxiety and depression -Continue Lexapro  Nasal trauma: Has vertical nasal slate, says that he fell recently and this was glued after he sought medical attention.  Appears to be healing well.  Acute thrombocytopenia: Secondary to alcoholism.  No signs of bleeding.  Hemoglobin stable.  Monitor.  DVT prophylaxis: enoxaparin (LOVENOX) injection 40 mg Start: 05/12/23 2000   Code Status: Full Code  Family Communication:  None present at bedside.  Lengthy discussion with the sister again today.  Status is: Inpatient Remains inpatient appropriate because: Patient on Precedex drip  Estimated body mass index is 27.26 kg/m as calculated from the following:   Height as of this encounter: 5\' 10"  (1.778 m).   Weight as of this encounter: 86.2 kg.    Nutritional Assessment: Body mass index is 27.26 kg/m.Marland Kitchen Seen by dietician.  I agree with the assessment and plan as outlined below: Nutrition Status:        . Skin Assessment: I have examined the patient's skin and I agree with the wound assessment as  performed by the wound care RN as outlined below:    Consultants:  None  Procedures:  None  Antimicrobials:  Anti-infectives (From admission, onward)    Start     Dose/Rate Route Frequency Ordered Stop   05/15/23 1000  vancomycin (VANCOCIN) capsule 125 mg        125 mg Oral 4 times daily 05/15/23 0741 05/25/23 0959   05/14/23 0800   piperacillin-tazobactam (ZOSYN) IVPB 3.375 g  Status:  Discontinued        3.375 g 12.5 mL/hr over 240 Minutes Intravenous Every 8 hours 05/14/23 0755 05/15/23 1140         Subjective: Patient seen and examined.  Patient is fully alert and oriented, although he still has gross tremors but he is significantly improved, he is joking and has good humor today.  Denies any complaints.  Objective: Vitals:   05/16/23 0440 05/16/23 0500 05/16/23 0600 05/16/23 0757  BP: 106/80 (!) 131/102 123/84   Pulse: 75 71 68   Resp: (!) 23 (!) 23 18   Temp:    98.9 F (37.2 C)  TempSrc:    Oral  SpO2: 99% 98% 95%   Weight:      Height:        Intake/Output Summary (Last 24 hours) at 05/16/2023 0807 Last data filed at 05/16/2023 0116 Gross per 24 hour  Intake 554.57 ml  Output 1450 ml  Net -895.43 ml   Filed Weights   05/12/23 0908  Weight: 86.2 kg    Examination:  General exam: Appears anxious because tenderness but still appears comfortable. Respiratory system: Clear to auscultation. Respiratory effort normal. Cardiovascular system: S1 & S2 heard, RRR. No JVD, murmurs, rubs, gallops or clicks. No pedal edema. Gastrointestinal system: Abdomen is nondistended, soft and nontender. No organomegaly or masses felt. Normal bowel sounds heard. Central nervous system: Alert and oriented. No focal neurological deficits. Extremities: Symmetric 5 x 5 power. Skin: No rashes, lesions or ulcers.    Data Reviewed: I have personally reviewed following labs and imaging studies  CBC: Recent Labs  Lab 05/12/23 0956 05/14/23 0530 05/15/23 0308 05/16/23 0259  WBC 7.3 4.3 5.1 5.3  NEUTROABS 4.5 2.7 3.3 3.2  HGB 17.1* 16.1 15.6 16.6  HCT 51.3 48.7 47.6 50.9  MCV 97.9 100.0 102.1* 100.4*  PLT 173 110* 113* 131*   Basic Metabolic Panel: Recent Labs  Lab 05/12/23 0956 05/13/23 0751 05/14/23 0530 05/15/23 0308 05/16/23 0259  NA 137 136 138 136 136  K 3.6 3.8 3.6 4.0 3.9  CL 92* 94* 96* 100  98  CO2 26 32 31 24 29   GLUCOSE 84 102* 82 112* 84  BUN 11 17 11 11 9   CREATININE 1.02 1.01 0.87 0.86 0.78  CALCIUM 8.2* 8.0* 8.8* 8.8* 8.6*  MG 2.5*  --  2.3  --   --    GFR: Estimated Creatinine Clearance: 105.2 mL/min (by C-G formula based on SCr of 0.78 mg/dL). Liver Function Tests: Recent Labs  Lab 05/12/23 0956 05/13/23 0751 05/14/23 0530 05/15/23 0308 05/16/23 0259  AST 292* 386* 566* 563* 334*  ALT 212* 289* 464* 556* 478*  ALKPHOS 47 46 61 49 47  BILITOT 0.6 1.1 0.8 0.8 0.8  PROT 7.5 6.2* 6.4* 6.4* 6.3*  ALBUMIN 4.8 4.1 3.8 3.8 3.7   Recent Labs  Lab 05/13/23 1714  LIPASE 47   Recent Labs  Lab 05/15/23 0308  AMMONIA 42*   Coagulation Profile: No results for input(s): "INR", "PROTIME" in the  last 168 hours. Cardiac Enzymes: No results for input(s): "CKTOTAL", "CKMB", "CKMBINDEX", "TROPONINI" in the last 168 hours. BNP (last 3 results) No results for input(s): "PROBNP" in the last 8760 hours. HbA1C: No results for input(s): "HGBA1C" in the last 72 hours. CBG: Recent Labs  Lab 05/15/23 1516 05/15/23 2003 05/15/23 2352 05/16/23 0320 05/16/23 0754  GLUCAP 98 84 90 86 83   Lipid Profile: No results for input(s): "CHOL", "HDL", "LDLCALC", "TRIG", "CHOLHDL", "LDLDIRECT" in the last 72 hours. Thyroid Function Tests: No results for input(s): "TSH", "T4TOTAL", "FREET4", "T3FREE", "THYROIDAB" in the last 72 hours. Anemia Panel: No results for input(s): "VITAMINB12", "FOLATE", "FERRITIN", "TIBC", "IRON", "RETICCTPCT" in the last 72 hours. Sepsis Labs: No results for input(s): "PROCALCITON", "LATICACIDVEN" in the last 168 hours.  Recent Results (from the past 240 hours)  C Difficile Quick Screen w PCR reflex     Status: Abnormal   Collection Time: 05/14/23  6:19 PM   Specimen: STOOL  Result Value Ref Range Status   C Diff antigen POSITIVE (A) NEGATIVE Final   C Diff toxin NEGATIVE NEGATIVE Final   C Diff interpretation Results are indeterminate. See PCR  results.  Final    Comment: Performed at [facility], [address] [place], [place] [address]  Gastrointestinal Panel by PCR , Stool     Status: None   Collection Time: 05/14/23  6:19 PM   Specimen: STOOL  Result Value Ref Range Status   Campylobacter species NOT DETECTED NOT DETECTED Final   Plesimonas shigelloides NOT DETECTED NOT DETECTED Final   Salmonella species NOT DETECTED NOT DETECTED Final   Yersinia enterocolitica NOT DETECTED NOT DETECTED Final   Vibrio species NOT DETECTED NOT DETECTED Final   Vibrio cholerae NOT DETECTED NOT DETECTED Final   Enteroaggregative E coli (EAEC) NOT DETECTED NOT DETECTED Final   Enteropathogenic E coli (EPEC) NOT DETECTED NOT DETECTED Final   Enterotoxigenic E coli (ETEC) NOT DETECTED NOT DETECTED Final   Shiga like toxin producing E coli (STEC) NOT DETECTED NOT DETECTED Final   Shigella/Enteroinvasive E coli (EIEC) NOT DETECTED NOT DETECTED Final   Cryptosporidium NOT DETECTED NOT DETECTED Final   Cyclospora cayetanensis NOT DETECTED NOT DETECTED Final   Entamoeba histolytica NOT DETECTED NOT DETECTED Final   Giardia lamblia NOT DETECTED NOT DETECTED Final   Adenovirus F40/41 NOT DETECTED NOT DETECTED Final   Astrovirus NOT DETECTED NOT DETECTED Final   Norovirus GI/GII NOT DETECTED NOT DETECTED Final   Rotavirus A NOT DETECTED NOT DETECTED Final   Sapovirus (I, II, IV, and V) NOT DETECTED NOT DETECTED Final    Comment: Performed at [facility], [address] [place], [place] [address]  C. Diff by PCR, Reflexed     Status: Abnormal   Collection Time: 05/14/23  6:19 PM  Result Value Ref Range Status   Toxigenic C. Difficile by PCR POSITIVE (A) NEGATIVE Final    Comment: Positive for toxigenic C. difficile with little to no toxin production. Only treat if clinical presentation suggests symptomatic illness. Performed at [facility], [address], [place], [place] [address]   MRSA Next Gen by PCR,  Nasal     Status: None   Collection Time: 05/14/23  7:31 PM   Specimen: Nasal Mucosa; Nasal Swab  Result Value Ref Range Status   MRSA by PCR Next Gen NOT DETECTED NOT DETECTED Final    Comment: (NOTE) The GeneXpert MRSA Assay (FDA approved for NASAL specimens only), is one component  of a comprehensive MRSA colonization surveillance program. It is not intended to diagnose MRSA infection nor to guide or monitor treatment for MRSA infections. Test performance is not FDA approved in patients less than 90 years old. Performed at [facility], [address] [place], [place] [address]      Radiology Studies: No results found.   Scheduled Meds:  azelastine  1 spray Each Nare BID   Chlorhexidine Gluconate Cloth  6 each Topical Daily   cloNIDine  0.1 mg Oral TID   enoxaparin (LOVENOX) injection  40 mg Subcutaneous Q24H   folic acid  1 mg Oral Daily   multivitamin with minerals  1 tablet Oral Daily   PHENObarbital  130 mg Intravenous BID   And   [START ON 05/17/2023] PHENObarbital  65 mg Intravenous BID   thiamine  100 mg Oral Daily   Or   thiamine  100 mg Intravenous Daily   vancomycin  125 mg Oral QID   Continuous Infusions:     LOS: 3 days   Hughie Closs, MD [facility]  05/16/2023, 8:07 AM   *Please note that this is a verbal dictation therefore any spelling or grammatical errors are due to the "Dragon Medical One" system interpretation.  Please page via Amion and do not message via secure chat for urgent patient care matters. Secure chat can be used for non urgent patient care matters.  How to contact the [facility] Attending or Consulting provider 7A - 7P or covering provider during after hours 7P -7A, for this patient?  Check the care team in [facility] and look for a) attending/consulting [facility] provider listed and b) the [facility] team listed. Page or secure chat 7A-7P. Log into [email or web address] and use [place]'s universal password to access. If you do not  have the password, please contact the hospital operator. Locate the [facility] provider you are looking for under [facility] and page to a number that you can be directly reached. If you still have difficulty reaching the provider, please page the [facility] (Director on Call) for the Hospitalists listed on amion for assistance.

## 2023-05-17 ENCOUNTER — Inpatient Hospital Stay (HOSPITAL_COMMUNITY): Payer: BLUE CROSS/BLUE SHIELD

## 2023-05-17 DIAGNOSIS — F10931 Alcohol use, unspecified with withdrawal delirium: Secondary | ICD-10-CM | POA: Diagnosis not present

## 2023-05-17 LAB — GLUCOSE, CAPILLARY
Glucose-Capillary: 90 mg/dL (ref 70–99)
Glucose-Capillary: 82 mg/dL (ref 70–99)
Glucose-Capillary: 119 mg/dL — ABNORMAL HIGH (ref 70–99)
Glucose-Capillary: 95 mg/dL (ref 70–99)
Glucose-Capillary: 133 mg/dL — ABNORMAL HIGH (ref 70–99)

## 2023-05-17 LAB — COMPREHENSIVE METABOLIC PANEL
ALT: 324 U/L — ABNORMAL HIGH (ref 0–44)
AST: 171 U/L — ABNORMAL HIGH (ref 15–41)
Albumin: 3.8 g/dL (ref 3.5–5.0)
Alkaline Phosphatase: 57 U/L (ref 38–126)
Anion gap: 9 (ref 5–15)
BUN: 18 mg/dL (ref 6–20)
CO2: 28 mmol/L (ref 22–32)
Calcium: 8.9 mg/dL (ref 8.9–10.3)
Chloride: 97 mmol/L — ABNORMAL LOW (ref 98–111)
Creatinine, Ser: 1.15 mg/dL (ref 0.61–1.24)
GFR, Estimated: >60 mL/min (ref >60)
Glucose, Bld: 130 mg/dL — ABNORMAL HIGH (ref 70–99)
Potassium: 3.5 mmol/L (ref 3.5–5.1)
Sodium: 134 mmol/L — ABNORMAL LOW (ref 135–145)
Total Bilirubin: 0.6 mg/dL (ref <1.2)
Total Protein: 6.5 g/dL (ref 6.5–8.1)

## 2023-05-17 MED ORDER — CEPHALEXIN 500 MG PO CAPS
500.0000 mg | ORAL_CAPSULE | Freq: Three times a day (TID) | ORAL | Status: DC
  Administered 2023-05-17 – 2023-05-19 (×6): 500 mg via ORAL
  Filled 2023-05-17 (×7): qty 1

## 2023-05-17 MED ORDER — LORAZEPAM 2 MG/ML IJ SOLN
2.0000 mg | Freq: Once | INTRAMUSCULAR | Status: AC
  Administered 2023-05-17: 2 mg via INTRAVENOUS
  Filled 2023-05-17: qty 1

## 2023-05-17 MED ORDER — IOHEXOL 300 MG/ML  SOLN
75.0000 mL | Freq: Once | INTRAMUSCULAR | Status: AC | PRN
  Administered 2023-05-17: 75 mL via INTRAVENOUS

## 2023-05-17 NOTE — Plan of Care (Signed)
  Problem: Clinical Measurements: Goal: Ability to maintain clinical measurements within normal limits will improve Outcome: Not Progressing Goal: Will remain free from infection Outcome: Progressing Goal: Diagnostic test results will improve Outcome: Progressing Goal: Respiratory complications will improve Outcome: Progressing Goal: Cardiovascular complication will be avoided Outcome: Progressing   Problem: Coping: Goal: Level of anxiety will decrease Outcome: Not Progressing   Problem: Pain Management: Goal: General experience of comfort will improve Outcome: Not Progressing

## 2023-05-17 NOTE — Progress Notes (Signed)
Patient not adhering to safety instructions, patient previously had witnessed fall. This charge nurse was called to bedside because patient unable to be redirected. Patient presented with unstable gait, demanding to walk the unit. Patient appeared anxious, and shaking, despite recent intervention to help with his etoh withdrawal. Patient pushed this RN, security was called. Patient was then agreeable to get back into bed. [facility] coverage paged and now at bedside.

## 2023-05-17 NOTE — Plan of Care (Signed)

## 2023-05-17 NOTE — Progress Notes (Signed)
   05/17/23 1836  What Happened  Was fall witnessed? Yes  Who witnessed fall? Priscilla Tech  Patients activity before fall to/from bed, chair, or stretcher;ambulating-assisted  Point of contact buttocks  Was patient injured? No  Patient found on floor  Found by Staff-comment Dorathy Daft Garcia-Contreras & Berdie Ogren)  Stated prior activity to/from bed, chair, or stretcher  Provider Notification  Provider Name/Title Dr. Jacqulyn Bath  Date Provider Notified 05/17/23  Time Provider Notified 1837  Method of Notification Page  Notification Reason Fall  Follow Up  Family notified No - patient refusal

## 2023-05-17 NOTE — Progress Notes (Signed)
Pt fell against bathroom door. Matts on door. Pt found on floor sitting up. Pt stated "I fell only on my butt, I didn't hit my head." Gershon Cull the tech in the room assisted with getting up. Pt refusing to adhere to safety precautions. Dr. Jacqulyn Bath notified

## 2023-05-17 NOTE — Progress Notes (Addendum)
PROGRESS NOTE    Jermaine [name]  ZOX:[id number] DOB: [birth year] DOA: 05/12/2023 PCP: Farris Has, MD   Brief Narrative:  Jermaine [name] is a 56 y.o. male with medical history significant of alcohol use disorder, EtOH withdrawal seizure, anxiety and depression who presents to the ED via EMS due to concern for EtOH withdrawal.  He was recently admitted for EtOH withdrawal from 12/4-12/6 but left AMA. He returned to the ED on 12/7 and was not found in significant withdrawal however psych did not think it was safe for patient to be admitted for detox due to his history of seizure and coma with EtOH withdrawals in the past.  He was discharged home on Librium which he states he took but had hallucinations while taking them. Reports drinking 1.5 case of white claw seltzer per day and last drink was around 6 AM on the day of ED presentation. He was found to be intoxicated on arrival but started going into withdrawal symptoms after more than 5 hours in the ED. admitted under hospital service, managed on the floor with Librium protocol and as needed Ativan however early morning of 05/15/2023, he continued to have higher CIWA over 20, not manageable with IV Ativan, was eventually transferred to ICU/stepdown and started on Precedex drip.  PCCM consulted.  Assessment & Plan:   Principal Problem:   Alcohol withdrawal (HCC) Active Problems:   Alcohol abuse  EtOH abuse with active withdrawal Was started on Librium protocol with as needed Ativan, early morning of 05/15/2023, he had significant withdrawal with CIWA persistently elevated over 20 despite of multiple doses of IV Ativan, was transferred to stepdown unit and started on Precedex drip.  PCCM consulted, he was then started on phenobarbital and eventually Precedex was discontinued same day.  Patient dramatically improved on 05/16/2023.  Despite of being tremulous, he was fully alert and oriented and despite of continuing phenobarbital, he was still  requiring as needed Ativan per CIWA protocol.  He is gradually improving.  Continue phenobarbital with as needed Ativan.  Acute C. difficile colitis: CT abdomen was obtained due to complaint of abdominal pain which showed colitis.  Patient was started on Zosyn while C. difficile was pending.  C. difficile eventually was positive evening of 05/14/2023, he was started on vancomycin and Zosyn discontinued.  Improving.  Alcoholic liver disease/liver lesion: LFTs improving further today.  CT completed 05/13/2023 shows fatty infiltration of the liver, viral hepatitis panel negative.  Interval increase in size of hypodense liver lesion on the right lobe was noted on the CT abdomen pelvis done in August 2024, nonemergent liver protocol MRI was recommended.  We will do this inpatient once patient is stabilized.   # Anxiety and depression -Continue Lexapro  Nasal trauma: Has vertical nasal slate, says that he fell recently and this was glued after he sought medical attention.  Appears to be healing well.  Acute thrombocytopenia: Secondary to alcoholism.  No signs of bleeding.  Hemoglobin stable.  Monitor.  Facial cellulitis: Patient recently had nasal fracture/trauma.  He was complaining of some tenderness and fullness at the right side of his nose.  Mildly tender upon examination.  Out of concern of abscess, CT maxillofacial with contrast was obtained which shows cellulitis but no abscess.  Will start him on Keflex.  DVT prophylaxis: enoxaparin (LOVENOX) injection 40 mg Start: 05/12/23 2000   Code Status: Full Code  Family Communication:  None present at bedside.  Lengthy discussion with the sister again today.  Status is: Inpatient Remains  inpatient appropriate because: Patient on Precedex drip  Estimated body mass index is 27.26 kg/m as calculated from the following:   Height as of this encounter: 5\' 10"  (1.778 m).   Weight as of this encounter: 86.2 kg.    Nutritional Assessment: Body mass  index is 27.26 kg/m.Marland Kitchen Seen by dietician.  I agree with the assessment and plan as outlined below: Nutrition Status:        . Skin Assessment: I have examined the patient's skin and I agree with the wound assessment as performed by the wound care RN as outlined below:    Consultants:  None  Procedures:  None  Antimicrobials:  Anti-infectives (From admission, onward)    Start     Dose/Rate Route Frequency Ordered Stop   05/15/23 1000  vancomycin (VANCOCIN) capsule 125 mg        125 mg Oral 4 times daily 05/15/23 0741 05/25/23 0959   05/14/23 0800  piperacillin-tazobactam (ZOSYN) IVPB 3.375 g  Status:  Discontinued        3.375 g 12.5 mL/hr over 240 Minutes Intravenous Every 8 hours 05/14/23 0755 05/15/23 1140         Subjective: Seen and examined.  Complains of anxiety and pain/tenderness at right facial area.  No other complaint.  Objective: Vitals:   05/17/23 0200 05/17/23 0349 05/17/23 0410 05/17/23 0808  BP: 102/79  100/60   Pulse: 78  71 86  Resp: (!) 21  19 17   Temp:  98 F (36.7 C)    TempSrc:  Oral    SpO2: 95%  94% 95%  Weight:      Height:        Intake/Output Summary (Last 24 hours) at 05/17/2023 0813 Last data filed at 05/16/2023 1700 Gross per 24 hour  Intake --  Output 1075 ml  Net -1075 ml   Filed Weights   05/12/23 0908  Weight: 86.2 kg    Examination:  General exam: Appears anxious and tremulous.  Very minimal tenderness at right side of the nose. Respiratory system: Clear to auscultation. Respiratory effort normal. Cardiovascular system: S1 & S2 heard, RRR. No JVD, murmurs, rubs, gallops or clicks. No pedal edema. Gastrointestinal system: Abdomen is nondistended, soft and nontender. No organomegaly or masses felt. Normal bowel sounds heard. Central nervous system: Alert and oriented. No focal neurological deficits. Extremities: Symmetric 5 x 5 power. Skin: No rashes, lesions or ulcers.    Data Reviewed: I have personally  reviewed following labs and imaging studies  CBC: Recent Labs  Lab 05/12/23 0956 05/14/23 0530 05/15/23 0308 05/16/23 0259  WBC 7.3 4.3 5.1 5.3  NEUTROABS 4.5 2.7 3.3 3.2  HGB 17.1* 16.1 15.6 16.6  HCT 51.3 48.7 47.6 50.9  MCV 97.9 100.0 102.1* 100.4*  PLT 173 110* 113* 131*   Basic Metabolic Panel: Recent Labs  Lab 05/12/23 0956 05/13/23 0751 05/14/23 0530 05/15/23 0308 05/16/23 0259  NA 137 136 138 136 136  K 3.6 3.8 3.6 4.0 3.9  CL 92* 94* 96* 100 98  CO2 26 32 31 24 29   GLUCOSE 84 102* 82 112* 84  BUN 11 17 11 11 9   CREATININE 1.02 1.01 0.87 0.86 0.78  CALCIUM 8.2* 8.0* 8.8* 8.8* 8.6*  MG 2.5*  --  2.3  --   --    GFR: Estimated Creatinine Clearance: 105.2 mL/min (by C-G formula based on SCr of 0.78 mg/dL). Liver Function Tests: Recent Labs  Lab 05/12/23 0956 05/13/23 0751 05/14/23 0530 05/15/23 0308 05/16/23  0259  AST 292* 386* 566* 563* 334*  ALT 212* 289* 464* 556* 478*  ALKPHOS 47 46 61 49 47  BILITOT 0.6 1.1 0.8 0.8 0.8  PROT 7.5 6.2* 6.4* 6.4* 6.3*  ALBUMIN 4.8 4.1 3.8 3.8 3.7   Recent Labs  Lab 05/13/23 1714  LIPASE 47   Recent Labs  Lab 05/15/23 0308  AMMONIA 42*   Coagulation Profile: No results for input(s): "INR", "PROTIME" in the last 168 hours. Cardiac Enzymes: No results for input(s): "CKTOTAL", "CKMB", "CKMBINDEX", "TROPONINI" in the last 168 hours. BNP (last 3 results) No results for input(s): "PROBNP" in the last 8760 hours. HbA1C: No results for input(s): "HGBA1C" in the last 72 hours. CBG: Recent Labs  Lab 05/16/23 1536 05/16/23 1932 05/16/23 2305 05/17/23 0346 05/17/23 0757  GLUCAP 85 96 160* 90 82   Lipid Profile: No results for input(s): "CHOL", "HDL", "LDLCALC", "TRIG", "CHOLHDL", "LDLDIRECT" in the last 72 hours. Thyroid Function Tests: No results for input(s): "TSH", "T4TOTAL", "FREET4", "T3FREE", "THYROIDAB" in the last 72 hours. Anemia Panel: No results for input(s): "VITAMINB12", "FOLATE", "FERRITIN",  "TIBC", "IRON", "RETICCTPCT" in the last 72 hours. Sepsis Labs: No results for input(s): "PROCALCITON", "LATICACIDVEN" in the last 168 hours.  Recent Results (from the past 240 hours)  C Difficile Quick Screen w PCR reflex     Status: Abnormal   Collection Time: 05/14/23  6:19 PM   Specimen: STOOL  Result Value Ref Range Status   C Diff antigen POSITIVE (A) NEGATIVE Final   C Diff toxin NEGATIVE NEGATIVE Final   C Diff interpretation Results are indeterminate. See PCR results.  Final    Comment: Performed at [facility], [address], [place], [place] [address]  Gastrointestinal Panel by PCR , Stool     Status: None   Collection Time: 05/14/23  6:19 PM   Specimen: STOOL  Result Value Ref Range Status   Campylobacter species NOT DETECTED NOT DETECTED Final   Plesimonas shigelloides NOT DETECTED NOT DETECTED Final   Salmonella species NOT DETECTED NOT DETECTED Final   Yersinia enterocolitica NOT DETECTED NOT DETECTED Final   Vibrio species NOT DETECTED NOT DETECTED Final   Vibrio cholerae NOT DETECTED NOT DETECTED Final   Enteroaggregative E coli (EAEC) NOT DETECTED NOT DETECTED Final   Enteropathogenic E coli (EPEC) NOT DETECTED NOT DETECTED Final   Enterotoxigenic E coli (ETEC) NOT DETECTED NOT DETECTED Final   Shiga like toxin producing E coli (STEC) NOT DETECTED NOT DETECTED Final   Shigella/Enteroinvasive E coli (EIEC) NOT DETECTED NOT DETECTED Final   Cryptosporidium NOT DETECTED NOT DETECTED Final   Cyclospora cayetanensis NOT DETECTED NOT DETECTED Final   Entamoeba histolytica NOT DETECTED NOT DETECTED Final   Giardia lamblia NOT DETECTED NOT DETECTED Final   Adenovirus F40/41 NOT DETECTED NOT DETECTED Final   Astrovirus NOT DETECTED NOT DETECTED Final   Norovirus GI/GII NOT DETECTED NOT DETECTED Final   Rotavirus A NOT DETECTED NOT DETECTED Final   Sapovirus (I, II, IV, and V) NOT DETECTED NOT DETECTED Final    Comment: Performed at [facility], [address] [place], [place] [address]  C. Diff by PCR, Reflexed     Status: Abnormal   Collection Time: 05/14/23  6:19 PM  Result Value Ref Range Status   Toxigenic C. Difficile by PCR POSITIVE (A) NEGATIVE Final    Comment: Positive for toxigenic C. difficile with little to no toxin production. Only treat if clinical presentation suggests symptomatic illness. Performed  at [facility], [address], [place], [place] [address]   MRSA Next Gen by PCR, Nasal     Status: None   Collection Time: 05/14/23  7:31 PM   Specimen: Nasal Mucosa; Nasal Swab  Result Value Ref Range Status   MRSA by PCR Next Gen NOT DETECTED NOT DETECTED Final    Comment: (NOTE) The GeneXpert MRSA Assay (FDA approved for NASAL specimens only), is one component of a comprehensive MRSA colonization surveillance program. It is not intended to diagnose MRSA infection nor to guide or monitor treatment for MRSA infections. Test performance is not FDA approved in patients less than 37 years old. Performed at [facility], [address], [place], [place] [address]      Radiology Studies: No results found.   Scheduled Meds:  azelastine  1 spray Each Nare BID   Chlorhexidine Gluconate Cloth  6 each Topical Daily   enoxaparin (LOVENOX) injection  40 mg Subcutaneous Q24H   folic acid  1 mg Oral Daily   multivitamin with minerals  1 tablet Oral Daily   PHENObarbital  130 mg Intravenous BID   And   PHENObarbital  65 mg Intravenous BID   thiamine  100 mg Oral Daily   Or   thiamine  100 mg Intravenous Daily   vancomycin  125 mg Oral QID   Continuous Infusions:     LOS: 4 days   Hughie Closs, MD [facility]  05/17/2023, 8:13 AM   *Please note that this is a verbal dictation therefore any spelling or grammatical errors are due to the "[employer]agon [employer] One" system interpretation.  Please page via Amion and do not message via secure chat for urgent patient care  matters. Secure chat can be used for non urgent patient care matters.  How to contact the [facility] Attending or Consulting provider 7A - 7P or covering provider during after hours 7P -7A, for this patient?  Check the care team in [facility] and look for a) attending/consulting [facility] provider listed and b) the [facility] team listed. Page or secure chat 7A-7P. Log into [email or web address] and use [facility] universal password to access. If you do not have the password, please contact the hospital operator. Locate the [facility] provider you are looking for under [facility] and page to a number that you can be directly reached. If you still have difficulty reaching the provider, please page the [facility] (Director on Call) for the Hospitalists listed on amion for assistance.

## 2023-05-17 NOTE — Progress Notes (Signed)
Tech walked in the hall. Pt refused to lay back down in bed. Pt sitting on edge of bed. Pt told tech, "I feel like throwing something." Notified RN. Dr. Jacqulyn Bath notified. CIWA 16. Ativan 3mg  given. Pain score of 10 out of 10. Tylenol given per requested. Pt stated "I need more than 3mg  of ativan." Pt educated on ciwa scoring. Pt stable at this time

## 2023-05-18 DIAGNOSIS — F10931 Alcohol use, unspecified with withdrawal delirium: Secondary | ICD-10-CM | POA: Diagnosis not present

## 2023-05-18 LAB — CBC WITH DIFFERENTIAL/PLATELET
Abs Immature Granulocytes: 0.14 10*3/uL — ABNORMAL HIGH (ref 0.00–0.07)
Basophils Absolute: 0.1 10*3/uL (ref 0.0–0.1)
Basophils Relative: 1 %
Eosinophils Absolute: 0.1 10*3/uL (ref 0.0–0.5)
Eosinophils Relative: 2 %
HCT: 48.6 % (ref 39.0–52.0)
Hemoglobin: 16 g/dL (ref 13.0–17.0)
Immature Granulocytes: 2 %
Lymphocytes Relative: 26 %
Lymphs Abs: 2 10*3/uL (ref 0.7–4.0)
MCH: 33.1 pg (ref 26.0–34.0)
MCHC: 32.9 g/dL (ref 30.0–36.0)
MCV: 100.6 fL — ABNORMAL HIGH (ref 80.0–100.0)
Monocytes Absolute: 0.6 10*3/uL (ref 0.1–1.0)
Monocytes Relative: 8 %
Neutro Abs: 4.6 10*3/uL (ref 1.7–7.7)
Neutrophils Relative %: 61 %
Platelets: 116 10*3/uL — ABNORMAL LOW (ref 150–400)
RBC: 4.83 MIL/uL (ref 4.22–5.81)
RDW: 13.2 % (ref 11.5–15.5)
WBC: 7.5 10*3/uL (ref 4.0–10.5)
nRBC: 0 % (ref 0.0–0.2)

## 2023-05-18 LAB — COMPREHENSIVE METABOLIC PANEL
ALT: 258 U/L — ABNORMAL HIGH (ref 0–44)
AST: 106 U/L — ABNORMAL HIGH (ref 15–41)
Albumin: 3.9 g/dL (ref 3.5–5.0)
Alkaline Phosphatase: 50 U/L (ref 38–126)
Anion gap: 8 (ref 5–15)
BUN: 15 mg/dL (ref 6–20)
CO2: 28 mmol/L (ref 22–32)
Calcium: 9 mg/dL (ref 8.9–10.3)
Chloride: 100 mmol/L (ref 98–111)
Creatinine, Ser: 0.94 mg/dL (ref 0.61–1.24)
GFR, Estimated: >60 mL/min (ref >60)
Glucose, Bld: 87 mg/dL (ref 70–99)
Potassium: 3.9 mmol/L (ref 3.5–5.1)
Sodium: 136 mmol/L (ref 135–145)
Total Bilirubin: 0.9 mg/dL (ref <1.2)
Total Protein: 6.4 g/dL — ABNORMAL LOW (ref 6.5–8.1)

## 2023-05-18 LAB — GLUCOSE, CAPILLARY
Glucose-Capillary: 83 mg/dL (ref 70–99)
Glucose-Capillary: 88 mg/dL (ref 70–99)
Glucose-Capillary: 92 mg/dL (ref 70–99)
Glucose-Capillary: 82 mg/dL (ref 70–99)
Glucose-Capillary: 114 mg/dL — ABNORMAL HIGH (ref 70–99)

## 2023-05-18 MED ORDER — PHENOBARBITAL 32.4 MG PO TABS
64.8000 mg | ORAL_TABLET | Freq: Two times a day (BID) | ORAL | Status: DC
  Administered 2023-05-18 – 2023-05-19 (×2): 64.8 mg via ORAL
  Filled 2023-05-18 (×2): qty 2

## 2023-05-18 MED ORDER — TRAZODONE HCL 50 MG PO TABS
25.0000 mg | ORAL_TABLET | Freq: Every evening | ORAL | Status: DC | PRN
  Administered 2023-05-18: 25 mg via ORAL
  Filled 2023-05-18: qty 1

## 2023-05-18 MED ORDER — TRAZODONE HCL 50 MG PO TABS
25.0000 mg | ORAL_TABLET | Freq: Once | ORAL | Status: AC
  Administered 2023-05-18: 25 mg via ORAL
  Filled 2023-05-18: qty 1

## 2023-05-18 NOTE — Plan of Care (Signed)
  Problem: Education: Goal: Knowledge of General Education information will improve Description: Including pain rating scale, medication(s)/side effects and non-pharmacologic comfort measures Outcome: Progressing   Problem: Coping: Goal: Level of anxiety will decrease Outcome: Progressing   

## 2023-05-18 NOTE — Progress Notes (Signed)
PROGRESS NOTE    Jermaine [name]  IEP:[id number] DOB: 1965-11-01 DOA: 05/12/2023 PCP: Farris Has, MD   Brief Narrative:  Jermaine [name] is a 57 y.o. male with medical history significant of alcohol use disorder, EtOH withdrawal seizure, anxiety and depression who presents to the ED via EMS due to concern for EtOH withdrawal.  He was recently admitted for EtOH withdrawal from 12/4-12/6 but left AMA. He returned to the ED on 12/7 and was not found in significant withdrawal however psych did not think it was safe for patient to be admitted for detox due to his history of seizure and coma with EtOH withdrawals in the past.  He was discharged home on Librium which he states he took but had hallucinations while taking them. Reports drinking 1.5 case of white claw seltzer per day and last drink was around 6 AM on the day of ED presentation. He was found to be intoxicated on arrival but started going into withdrawal symptoms after more than 5 hours in the ED. admitted under hospital service, managed on the floor with Librium protocol and as needed Ativan however early morning of 05/15/2023, he continued to have higher CIWA over 20, not manageable with IV Ativan, was eventually transferred to ICU/stepdown and started on Precedex drip.  PCCM consulted.  Assessment & Plan:   Principal Problem:   Alcohol withdrawal (HCC) Active Problems:   Alcohol abuse  EtOH abuse with active withdrawal Was started on Librium protocol with as needed Ativan, early morning of 05/15/2023, he had significant withdrawal with CIWA persistently elevated over 20 despite of multiple doses of IV Ativan, was transferred to stepdown unit and started on Precedex drip.  PCCM consulted, he was then started on phenobarbital and eventually Precedex was discontinued same day.  Patient dramatically improved on 05/16/2023.  Despite of being tremulous, he was fully alert and oriented and despite of continuing phenobarbital, he was still  requiring as needed Ativan per CIWA protocol.  However on the afternoon of 05/17/2023, patient became significantly tremulous, agitated and significant withdrawal, pushing staff, throwing stuff on them, soft restraints was started, patient needed at least 20 mg of Ativan and dose 12 hours.  This morning he is better, fully alert and oriented.  Remains on phenobarbital.  Acute C. difficile colitis: CT abdomen was obtained due to complaint of abdominal pain which showed colitis.  Patient was started on Zosyn while C. difficile was pending.  C. difficile eventually was positive evening of 05/14/2023, he was started on vancomycin and Zosyn discontinued.  Improving.  Alcoholic liver disease/liver lesion: LFTs improving further today.  CT completed 05/13/2023 shows fatty infiltration of the liver, viral hepatitis panel negative.  Interval increase in size of hypodense liver lesion on the right lobe was noted on the CT abdomen pelvis done in August 2024, nonemergent liver protocol MRI was recommended.  We will do this inpatient once patient is stabilized.   # Anxiety and depression -Continue Lexapro  Nasal trauma: Has vertical nasal slate, says that he fell recently and this was glued after he sought medical attention.  Appears to be healing well.  Acute thrombocytopenia: Secondary to alcoholism.  No signs of bleeding.  Hemoglobin stable.  Monitor.  Facial cellulitis: Patient recently had nasal fracture/trauma.  He was complaining of some tenderness and fullness at the right side of his nose.  Mildly tender upon examination.  Out of concern of abscess, CT maxillofacial with contrast was obtained which shows cellulitis but no abscess.  Started on Keflex 05/17/2023.  Witnessed fall in hospital: On afternoon of 05/17/2023, pt fell against bathroom door. Matts on door. Pt found on floor sitting up. Pt stated "I fell only on my butt, I didn't hit my head."  He has no complaints today.  DVT prophylaxis:  enoxaparin (LOVENOX) injection 40 mg Start: 05/12/23 2000   Code Status: Full Code  Family Communication:  None present at bedside.   Status is: Inpatient Remains inpatient appropriate because: Significant withdrawal.  Estimated body mass index is 27.26 kg/m as calculated from the following:   Height as of this encounter: 5\' 10"  (1.778 m).   Weight as of this encounter: 86.2 kg.    Nutritional Assessment: Body mass index is 27.26 kg/m.Marland Kitchen Seen by dietician.  I agree with the assessment and plan as outlined below: Nutrition Status:        . Skin Assessment: I have examined the patient's skin and I agree with the wound assessment as performed by the wound care RN as outlined below:    Consultants:  None  Procedures:  None  Antimicrobials:  Anti-infectives (From admission, onward)    Start     Dose/Rate Route Frequency Ordered Stop   05/17/23 1315  cephALEXin (KEFLEX) capsule 500 mg        500 mg Oral Every 8 hours 05/17/23 1228     05/15/23 1000  vancomycin (VANCOCIN) capsule 125 mg        125 mg Oral 4 times daily 05/15/23 0741 05/25/23 0959   05/14/23 0800  piperacillin-tazobactam (ZOSYN) IVPB 3.375 g  Status:  Discontinued        3.375 g 12.5 mL/hr over 240 Minutes Intravenous Every 8 hours 05/14/23 0755 05/15/23 1140         Subjective: Seen and examined.  Waking up from sleep.  Fully coherent.  No complaints.  Remembers everything about yesterday.  Objective: Vitals:   05/18/23 0300 05/18/23 0400 05/18/23 0613 05/18/23 0800  BP:  (!) 88/61 (!) 88/62   Pulse:  70    Resp:  14 13   Temp: 98.1 F (36.7 C)   (!) 96.4 F (35.8 C)  TempSrc: Axillary   Axillary  SpO2:  91%    Weight:      Height:        Intake/Output Summary (Last 24 hours) at 05/18/2023 0931 Last data filed at 05/18/2023 0500 Gross per 24 hour  Intake --  Output 2000 ml  Net -2000 ml   Filed Weights   05/12/23 0908  Weight: 86.2 kg    Examination:  General exam: Appears  tremulous Respiratory system: Clear to auscultation. Respiratory effort normal. Cardiovascular system: S1 & S2 heard, RRR. No JVD, murmurs, rubs, gallops or clicks. No pedal edema. Gastrointestinal system: Abdomen is nondistended, soft and nontender. No organomegaly or masses felt. Normal bowel sounds heard. Central nervous system: Alert and oriented. No focal neurological deficits. Extremities: Symmetric 5 x 5 power. Skin: No rashes, lesions or ulcers.    Data Reviewed: I have personally reviewed following labs and imaging studies  CBC: Recent Labs  Lab 05/12/23 0956 05/14/23 0530 05/15/23 0308 05/16/23 0259 05/18/23 0255  WBC 7.3 4.3 5.1 5.3 7.5  NEUTROABS 4.5 2.7 3.3 3.2 4.6  HGB 17.1* 16.1 15.6 16.6 16.0  HCT 51.3 48.7 47.6 50.9 48.6  MCV 97.9 100.0 102.1* 100.4* 100.6*  PLT 173 110* 113* 131* 116*   Basic Metabolic Panel: Recent Labs  Lab 05/12/23 0956 05/13/23 0751 05/14/23 0530 05/15/23 0308 05/16/23 0259 05/17/23 8119  05/18/23 0255  NA 137   < > 138 136 136 134* 136  K 3.6   < > 3.6 4.0 3.9 3.5 3.9  CL 92*   < > 96* 100 98 97* 100  CO2 26   < > 31 24 29 28 28   GLUCOSE 84   < > 82 112* 84 130* 87  BUN 11   < > 11 11 9 18 15   CREATININE 1.02   < > 0.87 0.86 0.78 1.15 0.94  CALCIUM 8.2*   < > 8.8* 8.8* 8.6* 8.9 9.0  MG 2.5*  --  2.3  --   --   --   --    < > = values in this interval not displayed.   GFR: Estimated Creatinine Clearance: 89.5 mL/min (by C-G formula based on SCr of 0.94 mg/dL). Liver Function Tests: Recent Labs  Lab 05/14/23 0530 05/15/23 0308 05/16/23 0259 05/17/23 0921 05/18/23 0255  AST 566* 563* 334* 171* 106*  ALT 464* 556* 478* 324* 258*  ALKPHOS 61 49 47 57 50  BILITOT 0.8 0.8 0.8 0.6 0.9  PROT 6.4* 6.4* 6.3* 6.5 6.4*  ALBUMIN 3.8 3.8 3.7 3.8 3.9   Recent Labs  Lab 05/13/23 1714  LIPASE 47   Recent Labs  Lab 05/15/23 0308  AMMONIA 42*   Coagulation Profile: No results for input(s): "INR", "PROTIME" in the last 168  hours. Cardiac Enzymes: No results for input(s): "CKTOTAL", "CKMB", "CKMBINDEX", "TROPONINI" in the last 168 hours. BNP (last 3 results) No results for input(s): "PROBNP" in the last 8760 hours. HbA1C: No results for input(s): "HGBA1C" in the last 72 hours. CBG: Recent Labs  Lab 05/17/23 1159 05/17/23 1721 05/17/23 1941 05/18/23 0254 05/18/23 0759  GLUCAP 119* 95 133* 83 88   Lipid Profile: No results for input(s): "CHOL", "HDL", "LDLCALC", "TRIG", "CHOLHDL", "LDLDIRECT" in the last 72 hours. Thyroid Function Tests: No results for input(s): "TSH", "T4TOTAL", "FREET4", "T3FREE", "THYROIDAB" in the last 72 hours. Anemia Panel: No results for input(s): "VITAMINB12", "FOLATE", "FERRITIN", "TIBC", "IRON", "RETICCTPCT" in the last 72 hours. Sepsis Labs: No results for input(s): "PROCALCITON", "LATICACIDVEN" in the last 168 hours.  Recent Results (from the past 240 hours)  C Difficile Quick Screen w PCR reflex     Status: Abnormal   Collection Time: 05/14/23  6:19 PM   Specimen: STOOL  Result Value Ref Range Status   C Diff antigen POSITIVE (A) NEGATIVE Final   C Diff toxin NEGATIVE NEGATIVE Final   C Diff interpretation Results are indeterminate. See PCR results.  Final    Comment: Performed at [facility], [address] [place], [place] [address]  Gastrointestinal Panel by PCR , Stool     Status: None   Collection Time: 05/14/23  6:19 PM   Specimen: STOOL  Result Value Ref Range Status   Campylobacter species NOT DETECTED NOT DETECTED Final   Plesimonas shigelloides NOT DETECTED NOT DETECTED Final   Salmonella species NOT DETECTED NOT DETECTED Final   Yersinia enterocolitica NOT DETECTED NOT DETECTED Final   Vibrio species NOT DETECTED NOT DETECTED Final   Vibrio cholerae NOT DETECTED NOT DETECTED Final   Enteroaggregative E coli (EAEC) NOT DETECTED NOT DETECTED Final   Enteropathogenic E coli (EPEC) NOT DETECTED NOT DETECTED Final   Enterotoxigenic E  coli (ETEC) NOT DETECTED NOT DETECTED Final   Shiga like toxin producing E coli (STEC) NOT DETECTED NOT DETECTED Final   Shigella/Enteroinvasive E coli (EIEC) NOT DETECTED NOT DETECTED  Final   Cryptosporidium NOT DETECTED NOT DETECTED Final   Cyclospora cayetanensis NOT DETECTED NOT DETECTED Final   Entamoeba histolytica NOT DETECTED NOT DETECTED Final   Giardia lamblia NOT DETECTED NOT DETECTED Final   Adenovirus F40/41 NOT DETECTED NOT DETECTED Final   Astrovirus NOT DETECTED NOT DETECTED Final   Norovirus GI/GII NOT DETECTED NOT DETECTED Final   Rotavirus A NOT DETECTED NOT DETECTED Final   Sapovirus (I, II, IV, and V) NOT DETECTED NOT DETECTED Final    Comment: Performed at [facility], [address] [place], [place] [address]  C. Diff by PCR, Reflexed     Status: Abnormal   Collection Time: 05/14/23  6:19 PM  Result Value Ref Range Status   Toxigenic C. Difficile by PCR POSITIVE (A) NEGATIVE Final    Comment: Positive for toxigenic C. difficile with little to no toxin production. Only treat if clinical presentation suggests symptomatic illness. Performed at [facility], [address] [place], [place] [address]   MRSA Next Gen by PCR, Nasal     Status: None   Collection Time: 05/14/23  7:31 PM   Specimen: Nasal Mucosa; Nasal Swab  Result Value Ref Range Status   MRSA by PCR Next Gen NOT DETECTED NOT DETECTED Final    Comment: (NOTE) The GeneXpert MRSA Assay (FDA approved for NASAL specimens only), is one component of a comprehensive MRSA colonization surveillance program. It is not intended to diagnose MRSA infection nor to guide or monitor treatment for MRSA infections. Test performance is not FDA approved in patients less than 57 years old. Performed at [facility], [address] [place], [place] [address]      Radiology Studies: CT MAXILLOFACIAL W CONTRAST Result Date: 05/17/2023 CLINICAL DATA:  Nasal abscess EXAM: CT  MAXILLOFACIAL WITH CONTRAST TECHNIQUE: Multidetector CT imaging of the maxillofacial structures was performed with intravenous contrast. Multiplanar CT image reconstructions were also generated. RADIATION DOSE REDUCTION: This exam was performed according to the departmental dose-optimization program which includes automated exposure control, adjustment of the mA and/or kV according to patient size and/or use of iterative reconstruction technique. CONTRAST:  75mL OMNIPAQUE IOHEXOL 300 MG/ML  SOLN COMPARISON:  05/01/2023 CT maxillofacial FINDINGS: Osseous: Redemonstrated comminuted nasal bone fractures. No evidence of fracture of the osseous nasal septum or anterior nasal spine. Redemonstrated mildly fracture of the right orbital floor and mildly displaced fracture of the anterior wall the right maxillary sinus. No new nasal bone fracture is seen. Orbits: Mildly depressed fracture of the right orbital floor, without evidence of entrapment of the inferior rectus muscle. The orbits are otherwise unremarkable. Sinuses: Mucosal thickening in the right maxillary sinus, ethmoid air cells, and right frontal sinus. The mastoids are well aerated. Soft tissues: Hyperenhancement and thickening of the soft tissues overlying the right aspect of the nose (series 3, image 50). No definite low-density, peripherally enhancing collection. Limited intracranial: No significant or unexpected finding. IMPRESSION: 1. Hyperenhancement and thickening of the soft tissues overlying the right aspect of the nose, which could represent cellulitis. No definite low-density, peripherally enhancing collection to suggest abscess. 2. Redemonstrated comminuted nasal bone fractures, mildly depressed fracture of the right orbital floor, and mildly displaced fracture of the anterior wall of the right maxillary sinus. Electronically Signed   By: Wiliam Ke M.D.   On: 05/17/2023 11:48     Scheduled Meds:  azelastine  1 spray Each Nare BID    cephALEXin  500 mg Oral Q8H   Chlorhexidine  Gluconate Cloth  6 each Topical Daily   enoxaparin (LOVENOX) injection  40 mg Subcutaneous Q24H   folic acid  1 mg Oral Daily   multivitamin with minerals  1 tablet Oral Daily   PHENObarbital  65 mg Intravenous BID   thiamine  100 mg Oral Daily   vancomycin  125 mg Oral QID   Continuous Infusions:     LOS: 5 days   Hughie Closs, MD [facility]  05/18/2023, 9:31 AM   *Please note that this is a verbal dictation therefore any spelling or grammatical errors are due to the "[employer] One" system interpretation.  Please page via Amion and do not message via secure chat for urgent patient care matters. Secure chat can be used for non urgent patient care matters.  How to contact the [facility] Attending or Consulting provider 7A - 7P or covering provider during after hours 7P -7A, for this patient?  Check the care team in [facility] and look for a) attending/consulting [facility] provider listed and b) the [facility] team listed. Page or secure chat 7A-7P. Log into [email or web address] and use [facility] universal password to access. If you do not have the password, please contact the hospital operator. Locate the [facility] provider you are looking for under [facility] and page to a number that you can be directly reached. If you still have difficulty reaching the provider, please page the [facility] (Director on Call) for the Hospitalists listed on amion for assistance.

## 2023-05-18 NOTE — Plan of Care (Signed)

## 2023-05-19 DIAGNOSIS — F10931 Alcohol use, unspecified with withdrawal delirium: Secondary | ICD-10-CM | POA: Diagnosis not present

## 2023-05-19 LAB — GLUCOSE, CAPILLARY
Glucose-Capillary: 85 mg/dL (ref 70–99)
Glucose-Capillary: 88 mg/dL (ref 70–99)
Glucose-Capillary: 95 mg/dL (ref 70–99)

## 2023-05-19 LAB — COMPREHENSIVE METABOLIC PANEL
ALT: 220 U/L — ABNORMAL HIGH (ref 0–44)
AST: 76 U/L — ABNORMAL HIGH (ref 15–41)
Albumin: 4.2 g/dL (ref 3.5–5.0)
Alkaline Phosphatase: 55 U/L (ref 38–126)
Anion gap: 12 (ref 5–15)
BUN: 14 mg/dL (ref 6–20)
CO2: 27 mmol/L (ref 22–32)
Calcium: 9.1 mg/dL (ref 8.9–10.3)
Chloride: 96 mmol/L — ABNORMAL LOW (ref 98–111)
Creatinine, Ser: 1.07 mg/dL (ref 0.61–1.24)
GFR, Estimated: >60 mL/min (ref >60)
Glucose, Bld: 90 mg/dL (ref 70–99)
Potassium: 4.1 mmol/L (ref 3.5–5.1)
Sodium: 135 mmol/L (ref 135–145)
Total Bilirubin: 0.8 mg/dL (ref <1.2)
Total Protein: 6.9 g/dL (ref 6.5–8.1)

## 2023-05-19 MED ORDER — VANCOMYCIN HCL 125 MG PO CAPS: 125.0000 mg | ORAL_CAPSULE | Freq: Four times a day (QID) | ORAL | 0 refills | Status: AC

## 2023-05-19 MED ORDER — CEPHALEXIN 500 MG PO CAPS: 500.0000 mg | ORAL_CAPSULE | Freq: Three times a day (TID) | ORAL | 0 refills | Status: DC

## 2023-05-19 NOTE — Progress Notes (Signed)
Pt leaving AMA. Educated by RN and provider on why he should stay. RN walked the unit with pt to calm him down. Pt still wanting to leave. Dr. Jacqulyn Bath notified, AMA papers signed by pt.
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
## 2023-05-20 ENCOUNTER — Inpatient Hospital Stay (HOSPITAL_COMMUNITY)
Admission: EM | Admit: 2023-05-20 | Discharge: 2023-05-24 | DRG: 897 | Disposition: A | Discharge status: Home / Self Care | Payer: BLUE CROSS/BLUE SHIELD | Attending: Family Medicine | Admitting: Family Medicine

## 2023-05-20 DIAGNOSIS — K760 Fatty (change of) liver, not elsewhere classified: Secondary | ICD-10-CM | POA: Diagnosis present

## 2023-05-20 DIAGNOSIS — R45850 Homicidal ideations: Secondary | ICD-10-CM | POA: Diagnosis present

## 2023-05-20 DIAGNOSIS — E872 Acidosis, unspecified: Secondary | ICD-10-CM | POA: Diagnosis present

## 2023-05-20 DIAGNOSIS — D6959 Other secondary thrombocytopenia: Secondary | ICD-10-CM | POA: Diagnosis present

## 2023-05-20 DIAGNOSIS — A0472 Enterocolitis due to Clostridium difficile, not specified as recurrent: Secondary | ICD-10-CM | POA: Diagnosis present

## 2023-05-20 DIAGNOSIS — E860 Dehydration: Secondary | ICD-10-CM | POA: Diagnosis present

## 2023-05-20 DIAGNOSIS — F10930 Alcohol use, unspecified with withdrawal, uncomplicated: Principal | ICD-10-CM

## 2023-05-20 DIAGNOSIS — F10229 Alcohol dependence with intoxication, unspecified: Secondary | ICD-10-CM | POA: Diagnosis present

## 2023-05-20 DIAGNOSIS — R296 Repeated falls: Secondary | ICD-10-CM | POA: Diagnosis present

## 2023-05-20 DIAGNOSIS — R45851 Suicidal ideations: Secondary | ICD-10-CM | POA: Diagnosis present

## 2023-05-20 DIAGNOSIS — K709 Alcoholic liver disease, unspecified: Secondary | ICD-10-CM | POA: Diagnosis present

## 2023-05-20 DIAGNOSIS — F419 Anxiety disorder, unspecified: Secondary | ICD-10-CM | POA: Diagnosis present

## 2023-05-20 DIAGNOSIS — E66811 Obesity, class 1: Secondary | ICD-10-CM | POA: Diagnosis present

## 2023-05-20 DIAGNOSIS — F10939 Alcohol use, unspecified with withdrawal, unspecified: Secondary | ICD-10-CM | POA: Diagnosis present

## 2023-05-20 DIAGNOSIS — F1094 Alcohol use, unspecified with alcohol-induced mood disorder: Secondary | ICD-10-CM

## 2023-05-20 DIAGNOSIS — D696 Thrombocytopenia, unspecified: Secondary | ICD-10-CM | POA: Diagnosis present

## 2023-05-20 DIAGNOSIS — F10230 Alcohol dependence with withdrawal, uncomplicated: Principal | ICD-10-CM | POA: Diagnosis present

## 2023-05-20 DIAGNOSIS — F1024 Alcohol dependence with alcohol-induced mood disorder: Secondary | ICD-10-CM | POA: Diagnosis present

## 2023-05-20 DIAGNOSIS — F32A Depression, unspecified: Secondary | ICD-10-CM | POA: Diagnosis present

## 2023-05-20 DIAGNOSIS — Z79899 Other long term (current) drug therapy: Secondary | ICD-10-CM

## 2023-05-20 LAB — COMPREHENSIVE METABOLIC PANEL
ALT: 176 U/L — ABNORMAL HIGH (ref 0–44)
AST: 90 U/L — ABNORMAL HIGH (ref 15–41)
Albumin: 4.8 g/dL (ref 3.5–5.0)
Alkaline Phosphatase: 58 U/L (ref 38–126)
Anion gap: 17 — ABNORMAL HIGH (ref 5–15)
BUN: 13 mg/dL (ref 6–20)
CO2: 20 mmol/L — ABNORMAL LOW (ref 22–32)
Calcium: 8.8 mg/dL — ABNORMAL LOW (ref 8.9–10.3)
Chloride: 92 mmol/L — ABNORMAL LOW (ref 98–111)
Creatinine, Ser: 1 mg/dL (ref 0.61–1.24)
GFR, Estimated: >60 mL/min (ref >60)
Glucose, Bld: 77 mg/dL (ref 70–99)
Potassium: 3.8 mmol/L (ref 3.5–5.1)
Sodium: 129 mmol/L — ABNORMAL LOW (ref 135–145)
Total Bilirubin: 0.8 mg/dL (ref <1.2)
Total Protein: 7.7 g/dL (ref 6.5–8.1)

## 2023-05-20 LAB — ETHANOL: Alcohol, Ethyl (B): 344 mg/dL (ref <10)

## 2023-05-20 LAB — CBC WITH DIFFERENTIAL/PLATELET
Abs Immature Granulocytes: 0.13 10*3/uL — ABNORMAL HIGH (ref 0.00–0.07)
Basophils Absolute: 0.1 10*3/uL (ref 0.0–0.1)
Basophils Relative: 1 %
Eosinophils Absolute: 0 10*3/uL (ref 0.0–0.5)
Eosinophils Relative: 0 %
HCT: 47.4 % (ref 39.0–52.0)
Hemoglobin: 16.6 g/dL (ref 13.0–17.0)
Immature Granulocytes: 2 %
Lymphocytes Relative: 32 %
Lymphs Abs: 2.4 10*3/uL (ref 0.7–4.0)
MCH: 33.8 pg (ref 26.0–34.0)
MCHC: 35 g/dL (ref 30.0–36.0)
MCV: 96.5 fL (ref 80.0–100.0)
Monocytes Absolute: 1 10*3/uL (ref 0.1–1.0)
Monocytes Relative: 13 %
Neutro Abs: 4 10*3/uL (ref 1.7–7.7)
Neutrophils Relative %: 52 %
Platelets: 191 10*3/uL (ref 150–400)
RBC: 4.91 MIL/uL (ref 4.22–5.81)
RDW: 12.8 % (ref 11.5–15.5)
WBC: 7.6 10*3/uL (ref 4.0–10.5)
nRBC: 0 % (ref 0.0–0.2)

## 2023-05-20 MED ORDER — LORAZEPAM 2 MG/ML IJ SOLN
2.0000 mg | Freq: Once | INTRAMUSCULAR | Status: DC
  Filled 2023-05-20: qty 1

## 2023-05-20 MED ORDER — ZIPRASIDONE MESYLATE 20 MG IM SOLR
20.0000 mg | Freq: Once | INTRAMUSCULAR | Status: AC
  Administered 2023-05-20: 20 mg via INTRAMUSCULAR
  Filled 2023-05-20: qty 20

## 2023-05-20 MED ORDER — LORAZEPAM 2 MG/ML IJ SOLN
1.0000 mg | Freq: Once | INTRAMUSCULAR | Status: AC
  Administered 2023-05-20: 1 mg via INTRAVENOUS

## 2023-05-20 MED ORDER — STERILE WATER FOR INJECTION IJ SOLN
INTRAMUSCULAR | Status: AC
  Filled 2023-05-20: qty 10

## 2023-05-20 MED ORDER — SODIUM CHLORIDE 0.9 % IV BOLUS
1000.0000 mL | Freq: Once | INTRAVENOUS | Status: AC
  Administered 2023-05-20: 1000 mL via INTRAVENOUS

## 2023-05-20 NOTE — ED Notes (Signed)
Pt O2 sat 88% RA, nurse placed pt on 2L [place] O2 increased to 91%.
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
## 2023-05-20 NOTE — ED Notes (Signed)
Sister Seymour Bennetts called for an update.

## 2023-05-20 NOTE — ED Provider Notes (Signed)
[facility]-ICU/STEPDOWN Provider Note  CSN: [id number] Arrival date & time: 05/20/23 1857  Chief Complaint(s) Alcohol Intoxication  HPI Jermaine [name] is a 57 y.o. male history of alcohol abuse presenting to the emergency department with intoxication.  Patient was apparently intoxicated, called relative who lives in [place] and made threats against her, said he had a gun.  She called the police who came to his house, were not able to locate any gun, patient continued to be agitated making general threats, they brought him to the emergency department.  Patient denies any suicidal ideation but endorses homicidal ideation towards "everybody".  He reports "I want some help".  History limited due to alcohol intoxication.   Past Medical History Past Medical History:  Diagnosis Date   Alcohol abuse    Anxiety    Class 1 obesity 09/14/2021   Depression    Patient Active Problem List   Diagnosis Date Noted   Orbital floor (blow-out) closed fracture (HCC) 05/03/2023   Alcohol withdrawal (HCC) 05/02/2023   Facial fracture due to fall (HCC) 05/02/2023   Class 1 obesity 09/14/2021   Secondary polycythemia 09/14/2021   Sinus tachycardia 07/01/2021   Anxiety and depression 11/20/2018   Hepatic steatosis 11/19/2018   Alcohol abuse with intoxication (HCC) 11/16/2018   Alcohol abuse 11/16/2018   Thrombocytopenia (HCC) 11/16/2018   Home Medication(s) Prior to Admission medications   Medication Sig Start Date End Date Taking? Authorizing Provider  escitalopram (LEXAPRO) 20 MG tablet Take 20 mg by mouth daily. 02/27/19  Yes [provider]  cephALEXin (KEFLEX) 500 MG capsule Take 1 capsule (500 mg total) by mouth every 8 (eight) hours for 6 days. Patient taking differently: Take 500 mg by mouth every 8 (eight) hours. Patient hasn't started medication, due to a "drinking binge". 05/21/2023. 05/19/23 05/25/23  Hughie Closs, MD  folic acid (FOLVITE) 1 MG tablet Take 1  tablet (1 mg total) by mouth daily. Patient not taking: Reported on 05/21/2023 01/28/23   Alberteen Sam, MD  gabapentin (NEURONTIN) 400 MG capsule Take 400 mg by mouth 3 (three) times daily. Patient not taking: Reported on 05/21/2023 01/28/23   [provider]  hydrOXYzine (ATARAX) 25 MG tablet Take 1 tablet (25 mg total) by mouth 3 (three) times daily as needed for itching or anxiety. Patient not taking: Reported on 05/21/2023 01/28/23   Alberteen Sam, MD  Multiple Vitamins-Minerals (MULTI VITAMIN/MINERALS) TABS Take 1 tablet by mouth daily. Patient not taking: Reported on 05/21/2023    [provider]  naltrexone (DEPADE) 50 MG tablet Take 50 mg by mouth daily. Patient not taking: Reported on 05/21/2023    [provider]  thiamine (VITAMIN B-1) 100 MG tablet Take 1 tablet (100 mg total) by mouth daily. Patient not taking: Reported on 05/21/2023 05/20/22   [name], MD  thiamine (VITAMIN B1) 100 MG tablet Take 100 mg by mouth daily. Patient not taking: Reported on 05/21/2023 01/31/23   [provider]  vancomycin (VANCOCIN) 125 MG capsule Take 1 capsule (125 mg total) by mouth 4 (four) times daily for 6 days. Patient taking differently: Take 125 mg by mouth 4 (four) times daily. Patient hasn't started medication, due to a "drinking binge". 05/21/2023. 05/19/23 05/25/23  Hughie Closs, MD  Past Surgical History Past Surgical History:  Procedure Laterality Date   HERNIA REPAIR     Family History Family History  Adopted: Yes    Social History Social History   Tobacco Use   Smoking status: Never   Smokeless tobacco: Never  Vaping Use   Vaping status: Never Used  Substance Use Topics   Alcohol use: Yes    Alcohol/week: 20.0 standard drinks of alcohol    Types: 20 Standard drinks or equivalent per week    Drug use: Not Currently   Allergies Ketamine  Review of Systems Review of Systems  All other systems reviewed and are negative.   Physical Exam Vital Signs  I have reviewed the triage vital signs BP 118/64   Pulse (!) 108   Temp 98.9 F (37.2 C) (Oral)   Resp 14   Ht 5\' 10"  (1.778 m)   Wt 85.4 kg   SpO2 94%   BMI 27.01 kg/m  Physical Exam Vitals and nursing note reviewed.  Constitutional:      General: He is not in acute distress.    Appearance: Normal appearance.  HENT:     Mouth/Throat:     Mouth: Mucous membranes are moist.  Eyes:     Conjunctiva/sclera: Conjunctivae normal.  Cardiovascular:     Rate and Rhythm: Normal rate and regular rhythm.  Pulmonary:     Effort: Pulmonary effort is normal. No respiratory distress.     Breath sounds: Normal breath sounds.  Abdominal:     General: Abdomen is flat.     Palpations: Abdomen is soft.     Tenderness: There is no abdominal tenderness.  Musculoskeletal:     Right lower leg: No edema.     Left lower leg: No edema.  Skin:    General: Skin is warm and dry.     Capillary Refill: Capillary refill takes less than 2 seconds.  Neurological:     Mental Status: He is alert.     Comments: Slurred speech, unsteady gait  Psychiatric:     Comments: Denies suicidal ideation, endorses generalized homicidal ideation.  Agitated affect     ED Results and Treatments Labs (all labs ordered are listed, but only abnormal results are displayed) Labs Reviewed  COMPREHENSIVE METABOLIC PANEL - Abnormal; Notable for the following components:      Result Value   Sodium 129 (*)    Chloride 92 (*)    CO2 20 (*)    Calcium 8.8 (*)    AST 90 (*)    ALT 176 (*)    Anion gap 17 (*)    All other components within normal limits  ETHANOL - Abnormal; Notable for the following components:   Alcohol, Ethyl (B) 344 (*)    All other components within normal limits  RAPID URINE DRUG SCREEN, HOSP PERFORMED - Abnormal; Notable for the  following components:   Benzodiazepines POSITIVE (*)    Barbiturates POSITIVE (*)    All other components within normal limits  CBC WITH DIFFERENTIAL/PLATELET - Abnormal; Notable for the following components:   Abs Immature Granulocytes 0.13 (*)    All other components within normal limits  BASIC METABOLIC PANEL - Abnormal; Notable for the following components:   CO2 21 (*)    Glucose, Bld 58 (*)    Calcium 8.5 (*)    Anion gap 17 (*)    All other components within normal limits  LACTIC ACID, PLASMA - Abnormal; Notable for the following components:   Lactic  Acid, Venous 3.2 (*)    All other components within normal limits  HEPATIC FUNCTION PANEL - Abnormal; Notable for the following components:   AST 70 (*)    ALT 138 (*)    All other components within normal limits  LACTIC ACID, PLASMA - Abnormal; Notable for the following components:   Lactic Acid, Venous 2.2 (*)    All other components within normal limits  MRSA NEXT GEN BY PCR, NASAL  CBC  MAGNESIUM  PHOSPHORUS                                                                                                                          Radiology No results found.  Pertinent labs & imaging results that were available during my care of the patient were reviewed by me and considered in my medical decision making (see MDM for details).  Medications Ordered in ED Medications  PHENObarbital (LUMINAL) injection 97.5 mg (97.5 mg Intravenous Given 05/21/23 1302)    Followed by  PHENObarbital (LUMINAL) injection 65 mg (has no administration in time range)    Followed by  PHENObarbital (LUMINAL) injection 32.5 mg (has no administration in time range)  enoxaparin (LOVENOX) injection 40 mg (0 mg Subcutaneous Hold 05/21/23 1035)  sodium chloride flush (NS) 0.9 % injection 3 mL (3 mLs Intravenous Given 05/21/23 1310)  acetaminophen (TYLENOL) tablet 1,000 mg (has no administration in time range)  polyethylene glycol (MIRALAX / GLYCOLAX)  packet 17 g (has no administration in time range)  ondansetron (ZOFRAN) tablet 4 mg (has no administration in time range)    Or  ondansetron (ZOFRAN) injection 4 mg (has no administration in time range)  folic acid (FOLVITE) tablet 1 mg (0 mg Oral Hold 05/21/23 1036)  multivitamin with minerals tablet 1 tablet (0 tablets Oral Hold 05/21/23 1037)  thiamine (VITAMIN B1) tablet 100 mg (0 mg Oral Hold 05/21/23 1037)  melatonin tablet 9 mg (has no administration in time range)  LORazepam (ATIVAN) tablet 1-4 mg (2 mg Oral Given 05/21/23 1403)    Or  LORazepam (ATIVAN) injection 1-4 mg ( Intravenous See Alternative 05/21/23 1403)  cephALEXin (KEFLEX) capsule 500 mg (500 mg Oral Given 05/21/23 1308)  vancomycin (VANCOCIN) capsule 125 mg (125 mg Oral Given 05/21/23 1309)  escitalopram (LEXAPRO) tablet 20 mg (0 mg Oral Hold 05/21/23 1036)  hydrOXYzine (ATARAX) tablet 25 mg (has no administration in time range)  Chlorhexidine Gluconate Cloth 2 % PADS 6 each (6 each Topical Given 05/21/23 0844)  Oral care mouth rinse (has no administration in time range)  haloperidol lactate (HALDOL) injection 5 mg (has no administration in time range)  mirtazapine (REMERON) tablet 15 mg (has no administration in time range)  sodium chloride 0.9 % bolus 1,000 mL (0 mLs Intravenous Stopped 05/20/23 2032)  LORazepam (ATIVAN) injection 1 mg (1 mg Intravenous Given 05/20/23 1951)  ziprasidone (GEODON) injection 20 mg (20 mg Intramuscular Given 05/20/23 2023)  sterile water (preservative free) injection (  Given 05/20/23  2023)  sodium chloride 0.9 % bolus 1,000 mL ( Intravenous Infusion Verify 05/21/23 1147)                                                                                                                                     Procedures Procedures  (including critical care time)  Medical Decision Making / ED Course   MDM:  57 year old presenting to the emergency department with alcohol  intoxication.  Patient very intoxicated.  He is making threats against family members and general public.  Will observe.  Patient has a complicated history of severe alcohol withdrawal.  Will reassess once sober whether patient is continuing to endorse any psychiatric complaints necessitating psychiatric evaluation. Suspect primarily this is due to his intoxication.  Will check basic labs, ethanol.  Given agitation will give dose of Ativan.  Clinical Course as of 05/21/23 1540  Mon May 20, 2023  2317 Signed out to Dr. Posey Rea pending sobriety and re-assessment for improvement in behavior. D [WS]    Clinical Course User Index [WS] Lonell Grandchild, MD     Additional history obtained: -Additional history obtained from ems -External records from outside source obtained and reviewed including: Chart review including previous notes, labs, imaging, consultation notes including prior admissions for withdrawal    Lab Tests: -I ordered, reviewed, and interpreted labs.   The pertinent results include:   Labs Reviewed  COMPREHENSIVE METABOLIC PANEL - Abnormal; Notable for the following components:      Result Value   Sodium 129 (*)    Chloride 92 (*)    CO2 20 (*)    Calcium 8.8 (*)    AST 90 (*)    ALT 176 (*)    Anion gap 17 (*)    All other components within normal limits  ETHANOL - Abnormal; Notable for the following components:   Alcohol, Ethyl (B) 344 (*)    All other components within normal limits  RAPID URINE DRUG SCREEN, HOSP PERFORMED - Abnormal; Notable for the following components:   Benzodiazepines POSITIVE (*)    Barbiturates POSITIVE (*)    All other components within normal limits  CBC WITH DIFFERENTIAL/PLATELET - Abnormal; Notable for the following components:   Abs Immature Granulocytes 0.13 (*)    All other components within normal limits  BASIC METABOLIC PANEL - Abnormal; Notable for the following components:   CO2 21 (*)    Glucose, Bld 58 (*)    Calcium 8.5  (*)    Anion gap 17 (*)    All other components within normal limits  LACTIC ACID, PLASMA - Abnormal; Notable for the following components:   Lactic Acid, Venous 3.2 (*)    All other components within normal limits  HEPATIC FUNCTION PANEL - Abnormal; Notable for the following components:   AST 70 (*)    ALT 138 (*)    All other components within normal limits  LACTIC  ACID, PLASMA - Abnormal; Notable for the following components:   Lactic Acid, Venous 2.2 (*)    All other components within normal limits  MRSA NEXT GEN BY PCR, NASAL  CBC  MAGNESIUM  PHOSPHORUS    Notable for eelvated alcohol level   EKG   EKG Interpretation Date/Time:  Monday May 20 2023 20:04:55 EST Ventricular Rate:  98 PR Interval:  143 QRS Duration:  107 QT Interval:  369 QTC Calculation: 472 R Axis:   101  Text Interpretation: Sinus rhythm Right axis deviation Confirmed by Alvino Blood ([id number]) on 05/20/2023 8:15:42 PM         Medicines ordered and prescription drug management: Meds ordered this encounter  Medications   DISCONTD: LORazepam (ATIVAN) injection 2 mg   sodium chloride 0.9 % bolus 1,000 mL   LORazepam (ATIVAN) injection 1 mg   ziprasidone (GEODON) injection 20 mg   sterile water (preservative free) injection    Rebecca Eaton K: cabinet override   DISCONTD: LORazepam (ATIVAN) tablet 1-4 mg    CIWA-AR < 5 =:   0 mg    CIWA-AR 5 -10 =:   1 mg    CIWA-AR 11 -15 =:   2 mg    CIWA-AR 16 -20 =:   3 mg    CIWA-AR 16 -20 =:   Recheck CIWA-AR in 1 hour; if > 20 notify MD    CIWA-AR > 20 =:   4 mg    CIWA-AR > 20 =:   Call Rapid Response   DISCONTD: LORazepam (ATIVAN) injection 1-4 mg    CIWA-AR < 5 =:   0 mg    CIWA-AR 5 -10 =:   1 mg    CIWA-AR 11 -15 =:   2 mg    CIWA-AR 16 -20 =:   3 mg    CIWA-AR 16 -20 =:   Recheck CIWA-AR in 1 hour; if > 20 notify MD    CIWA-AR > 20 =:   4 mg    CIWA-AR > 20 =:   Call Rapid Response   DISCONTD: thiamine (VITAMIN B1) tablet 100 mg    DISCONTD: thiamine (VITAMIN B1) injection 100 mg   DISCONTD: folic acid (FOLVITE) tablet 1 mg   DISCONTD: multivitamin with minerals tablet 1 tablet   DISCONTD: LORazepam (ATIVAN) injection 1 mg   DISCONTD: PHENobarbital (LUMINAL) tablet 97.2 mg   DISCONTD: PHENobarbital (LUMINAL) tablet 64.8 mg   DISCONTD: PHENobarbital (LUMINAL) tablet 32.4 mg   DISCONTD: LORazepam (ATIVAN) injection 1 mg   FOLLOWED BY Linked Order Group    PHENObarbital (LUMINAL) injection 97.5 mg    PHENObarbital (LUMINAL) injection 65 mg    PHENObarbital (LUMINAL) injection 32.5 mg   enoxaparin (LOVENOX) injection 40 mg   sodium chloride flush (NS) 0.9 % injection 3 mL   acetaminophen (TYLENOL) tablet 1,000 mg   polyethylene glycol (MIRALAX / GLYCOLAX) packet 17 g   OR Linked Order Group    ondansetron (ZOFRAN) tablet 4 mg    ondansetron (ZOFRAN) injection 4 mg   folic acid (FOLVITE) tablet 1 mg   multivitamin with minerals tablet 1 tablet   thiamine (VITAMIN B1) tablet 100 mg   melatonin tablet 9 mg   OR Linked Order Group    LORazepam (ATIVAN) tablet 1-4 mg     CIWA-AR < 5 =:   0 mg     CIWA-AR 5 -10 =:   1 mg     CIWA-AR 11 -15 =:   2  mg     CIWA-AR 16 -20 =:   3 mg     CIWA-AR 16 -20 =:   Recheck CIWA-AR in 1 hour; if > 20 notify MD     CIWA-AR > 20 =:   4 mg     CIWA-AR > 20 =:   Call Rapid Response    LORazepam (ATIVAN) injection 1-4 mg     CIWA-AR < 5 =:   0 mg     CIWA-AR 5 -10 =:   1 mg     CIWA-AR 11 -15 =:   2 mg     CIWA-AR 16 -20 =:   3 mg     CIWA-AR 16 -20 =:   Recheck CIWA-AR in 1 hour; if > 20 notify MD     CIWA-AR > 20 =:   4 mg     CIWA-AR > 20 =:   Call Rapid Response   cephALEXin (KEFLEX) capsule 500 mg    Patient taking differently: Patient hasn't started medication, due to a "drinking binge". 05/21/2023.     vancomycin (VANCOCIN) capsule 125 mg    Patient taking differently: Patient hasn't started medication, due to a "drinking binge". 05/21/2023.     escitalopram (LEXAPRO)  tablet 20 mg   hydrOXYzine (ATARAX) tablet 25 mg   Chlorhexidine Gluconate Cloth 2 % PADS 6 each   Oral care mouth rinse   sodium chloride 0.9 % bolus 1,000 mL   haloperidol lactate (HALDOL) injection 5 mg   mirtazapine (REMERON) tablet 15 mg    -I have reviewed the patients home medicines and have made adjustments as needed  Social Determinants of Health:  Diagnosis or treatment significantly limited by social determinants of health: alcohol use   Reevaluation: After the interventions noted above, I reevaluated the patient and found that their symptoms have stayed the same  Co morbidities that complicate the patient evaluation  Past Medical History:  Diagnosis Date   Alcohol abuse    Anxiety    Class 1 obesity 09/14/2021   Depression       Dispostion: Disposition decision including need for hospitalization was considered, and patient disposition pending at time of sign out.    Final Clinical Impression(s) / ED Diagnoses Final diagnoses:  Alcohol withdrawal syndrome without complication (HCC)     This chart was dictated using voice recognition software.  Despite best efforts to proofread,  errors can occur which can change the documentation meaning.    Lonell Grandchild, MD 05/21/23 1540

## 2023-05-20 NOTE — ED Triage Notes (Signed)
Pt brought by the security officer from home. As per the officer, relative called them because pt is alcohol intoxicated and threatened to kill them and hurt his  self, but he denied it with the police officer. Pt anxious and unstable.

## 2023-05-21 ENCOUNTER — Other Ambulatory Visit: Payer: Self-pay

## 2023-05-21 ENCOUNTER — Encounter (HOSPITAL_COMMUNITY): Payer: Self-pay | Admitting: Internal Medicine

## 2023-05-21 DIAGNOSIS — R45851 Suicidal ideations: Secondary | ICD-10-CM | POA: Diagnosis present

## 2023-05-21 DIAGNOSIS — F1024 Alcohol dependence with alcohol-induced mood disorder: Secondary | ICD-10-CM | POA: Diagnosis present

## 2023-05-21 DIAGNOSIS — F419 Anxiety disorder, unspecified: Secondary | ICD-10-CM | POA: Diagnosis present

## 2023-05-21 DIAGNOSIS — A0472 Enterocolitis due to Clostridium difficile, not specified as recurrent: Secondary | ICD-10-CM | POA: Diagnosis present

## 2023-05-21 DIAGNOSIS — F10230 Alcohol dependence with withdrawal, uncomplicated: Secondary | ICD-10-CM | POA: Diagnosis present

## 2023-05-21 DIAGNOSIS — F10932 Alcohol use, unspecified with withdrawal with perceptual disturbance: Secondary | ICD-10-CM

## 2023-05-21 DIAGNOSIS — F1094 Alcohol use, unspecified with alcohol-induced mood disorder: Secondary | ICD-10-CM | POA: Diagnosis not present

## 2023-05-21 DIAGNOSIS — K760 Fatty (change of) liver, not elsewhere classified: Secondary | ICD-10-CM | POA: Diagnosis present

## 2023-05-21 DIAGNOSIS — D6959 Other secondary thrombocytopenia: Secondary | ICD-10-CM | POA: Diagnosis present

## 2023-05-21 DIAGNOSIS — E66811 Obesity, class 1: Secondary | ICD-10-CM | POA: Diagnosis present

## 2023-05-21 DIAGNOSIS — F32A Depression, unspecified: Secondary | ICD-10-CM | POA: Diagnosis present

## 2023-05-21 DIAGNOSIS — E860 Dehydration: Secondary | ICD-10-CM | POA: Diagnosis present

## 2023-05-21 DIAGNOSIS — R296 Repeated falls: Secondary | ICD-10-CM | POA: Diagnosis present

## 2023-05-21 DIAGNOSIS — E872 Acidosis, unspecified: Secondary | ICD-10-CM | POA: Diagnosis present

## 2023-05-21 DIAGNOSIS — Z79899 Other long term (current) drug therapy: Secondary | ICD-10-CM | POA: Diagnosis not present

## 2023-05-21 DIAGNOSIS — F10229 Alcohol dependence with intoxication, unspecified: Secondary | ICD-10-CM | POA: Diagnosis present

## 2023-05-21 DIAGNOSIS — K709 Alcoholic liver disease, unspecified: Secondary | ICD-10-CM | POA: Diagnosis present

## 2023-05-21 DIAGNOSIS — R45850 Homicidal ideations: Secondary | ICD-10-CM | POA: Diagnosis present

## 2023-05-21 DIAGNOSIS — F10930 Alcohol use, unspecified with withdrawal, uncomplicated: Secondary | ICD-10-CM | POA: Diagnosis not present

## 2023-05-21 LAB — BASIC METABOLIC PANEL
Anion gap: 17 — ABNORMAL HIGH (ref 5–15)
BUN: 13 mg/dL (ref 6–20)
CO2: 21 mmol/L — ABNORMAL LOW (ref 22–32)
Calcium: 8.5 mg/dL — ABNORMAL LOW (ref 8.9–10.3)
Chloride: 100 mmol/L (ref 98–111)
Creatinine, Ser: 0.83 mg/dL (ref 0.61–1.24)
GFR, Estimated: >60 mL/min (ref >60)
Glucose, Bld: 58 mg/dL — ABNORMAL LOW (ref 70–99)
Potassium: 4.1 mmol/L (ref 3.5–5.1)
Sodium: 138 mmol/L (ref 135–145)

## 2023-05-21 LAB — LACTIC ACID, PLASMA
Lactic Acid, Venous: 3.2 mmol/L (ref 0.5–1.9)
Lactic Acid, Venous: 2.2 mmol/L (ref 0.5–1.9)

## 2023-05-21 LAB — CBC
HCT: 46.2 % (ref 39.0–52.0)
Hemoglobin: 15.7 g/dL (ref 13.0–17.0)
MCH: 32.9 pg (ref 26.0–34.0)
MCHC: 34 g/dL (ref 30.0–36.0)
MCV: 96.9 fL (ref 80.0–100.0)
Platelets: 193 10*3/uL (ref 150–400)
RBC: 4.77 MIL/uL (ref 4.22–5.81)
RDW: 13.1 % (ref 11.5–15.5)
WBC: 4.8 10*3/uL (ref 4.0–10.5)
nRBC: 0 % (ref 0.0–0.2)

## 2023-05-21 LAB — HEPATIC FUNCTION PANEL
ALT: 138 U/L — ABNORMAL HIGH (ref 0–44)
AST: 70 U/L — ABNORMAL HIGH (ref 15–41)
Albumin: 4 g/dL (ref 3.5–5.0)
Alkaline Phosphatase: 51 U/L (ref 38–126)
Bilirubin, Direct: 0.1 mg/dL (ref 0.0–0.2)
Indirect Bilirubin: 0.6 mg/dL (ref 0.3–0.9)
Total Bilirubin: 0.7 mg/dL (ref <1.2)
Total Protein: 6.6 g/dL (ref 6.5–8.1)

## 2023-05-21 LAB — RAPID URINE DRUG SCREEN, HOSP PERFORMED
Amphetamines: NOT DETECTED
Barbiturates: POSITIVE — AB
Benzodiazepines: POSITIVE — AB
Cocaine: NOT DETECTED
Opiates: NOT DETECTED
Tetrahydrocannabinol: NOT DETECTED

## 2023-05-21 LAB — MAGNESIUM: Magnesium: 2.3 mg/dL (ref 1.7–2.4)

## 2023-05-21 LAB — PHOSPHORUS: Phosphorus: 2.6 mg/dL (ref 2.5–4.6)

## 2023-05-21 LAB — MRSA NEXT GEN BY PCR, NASAL: MRSA by PCR Next Gen: NOT DETECTED

## 2023-05-21 MED ORDER — ONDANSETRON HCL 4 MG PO TABS
4.0000 mg | ORAL_TABLET | Freq: Four times a day (QID) | ORAL | Status: DC | PRN
Start: 2023-05-21 — End: 2023-05-24

## 2023-05-21 MED ORDER — CHLORHEXIDINE GLUCONATE CLOTH 2 % EX PADS
6.0000 | MEDICATED_PAD | Freq: Every day | CUTANEOUS | Status: DC
  Administered 2023-05-21 – 2023-05-23 (×3): 6 via TOPICAL

## 2023-05-21 MED ORDER — SODIUM CHLORIDE 0.9 % IV BOLUS
1000.0000 mL | Freq: Once | INTRAVENOUS | Status: AC
  Administered 2023-05-21: 1000 mL via INTRAVENOUS

## 2023-05-21 MED ORDER — ORAL CARE MOUTH RINSE: 15.0000 mL | OROMUCOSAL | Status: DC | PRN

## 2023-05-21 MED ORDER — THIAMINE MONONITRATE 100 MG PO TABS: 100.0000 mg | ORAL_TABLET | Freq: Every day | ORAL | Status: DC

## 2023-05-21 MED ORDER — AZELASTINE HCL 0.1 % NA SOLN
1.0000 | Freq: Two times a day (BID) | NASAL | Status: AC
  Administered 2023-05-21 – 2023-05-22 (×2): 1 via NASAL
  Filled 2023-05-21: qty 30

## 2023-05-21 MED ORDER — CEPHALEXIN 500 MG PO CAPS
500.0000 mg | ORAL_CAPSULE | Freq: Three times a day (TID) | ORAL | Status: DC
  Administered 2023-05-21 – 2023-05-24 (×11): 500 mg via ORAL
  Filled 2023-05-21 (×12): qty 1

## 2023-05-21 MED ORDER — LORAZEPAM 2 MG/ML IJ SOLN
1.0000 mg | INTRAMUSCULAR | Status: AC | PRN
  Administered 2023-05-21 (×2): 2 mg via INTRAVENOUS
  Administered 2023-05-21: 4 mg via INTRAVENOUS
  Filled 2023-05-21: qty 1
  Filled 2023-05-21: qty 2
  Filled 2023-05-21: qty 1

## 2023-05-21 MED ORDER — ONDANSETRON HCL 4 MG/2ML IJ SOLN: 4.0000 mg | Freq: Four times a day (QID) | INTRAMUSCULAR | Status: DC | PRN

## 2023-05-21 MED ORDER — ENOXAPARIN SODIUM 40 MG/0.4ML IJ SOSY
40.0000 mg | PREFILLED_SYRINGE | INTRAMUSCULAR | Status: DC
  Administered 2023-05-22 – 2023-05-24 (×3): 40 mg via SUBCUTANEOUS
  Filled 2023-05-21 (×3): qty 0.4

## 2023-05-21 MED ORDER — THIAMINE HCL 100 MG/ML IJ SOLN: 100.0000 mg | Freq: Every day | INTRAMUSCULAR | Status: DC

## 2023-05-21 MED ORDER — LORAZEPAM 1 MG PO TABS
1.0000 mg | ORAL_TABLET | ORAL | Status: AC | PRN
Start: 2023-05-21 — End: 2023-05-24
  Administered 2023-05-21: 3 mg via ORAL
  Administered 2023-05-21: 2 mg via ORAL
  Administered 2023-05-21 – 2023-05-22 (×2): 3 mg via ORAL
  Administered 2023-05-22: 2 mg via ORAL
  Administered 2023-05-23: 1 mg via ORAL
  Filled 2023-05-21: qty 3
  Filled 2023-05-21: qty 1
  Filled 2023-05-21: qty 3
  Filled 2023-05-21 (×2): qty 2
  Filled 2023-05-21: qty 3

## 2023-05-21 MED ORDER — LORAZEPAM 2 MG/ML IJ SOLN: 1.0000 mg | INTRAMUSCULAR | Status: DC | PRN

## 2023-05-21 MED ORDER — POLYETHYLENE GLYCOL 3350 17 G PO PACK: 17.0000 g | PACK | Freq: Every day | ORAL | Status: DC | PRN

## 2023-05-21 MED ORDER — MELATONIN 3 MG PO TABS
9.0000 mg | ORAL_TABLET | Freq: Every day | ORAL | Status: DC
Start: 2023-05-21 — End: 2023-05-24
  Administered 2023-05-21 – 2023-05-23 (×3): 9 mg via ORAL
  Filled 2023-05-21 (×4): qty 3

## 2023-05-21 MED ORDER — PHENOBARBITAL 97.2 MG PO TABS: 97.2000 mg | ORAL_TABLET | Freq: Three times a day (TID) | ORAL | Status: DC

## 2023-05-21 MED ORDER — FOLIC ACID 1 MG PO TABS: 1.0000 mg | ORAL_TABLET | Freq: Every day | ORAL | Status: DC

## 2023-05-21 MED ORDER — ACETAMINOPHEN 500 MG PO TABS
1000.0000 mg | ORAL_TABLET | Freq: Four times a day (QID) | ORAL | Status: DC | PRN
  Administered 2023-05-23: 1000 mg via ORAL
  Filled 2023-05-21: qty 2

## 2023-05-21 MED ORDER — LORAZEPAM 1 MG PO TABS
1.0000 mg | ORAL_TABLET | ORAL | Status: DC | PRN
Start: 2023-05-21 — End: 2023-05-21

## 2023-05-21 MED ORDER — ADULT MULTIVITAMIN W/MINERALS CH
1.0000 | ORAL_TABLET | Freq: Every day | ORAL | Status: DC
  Administered 2023-05-22 – 2023-05-24 (×3): 1 via ORAL
  Filled 2023-05-21 (×3): qty 1

## 2023-05-21 MED ORDER — VANCOMYCIN HCL 125 MG PO CAPS
125.0000 mg | ORAL_CAPSULE | Freq: Four times a day (QID) | ORAL | Status: DC
  Administered 2023-05-21 – 2023-05-24 (×13): 125 mg via ORAL
  Filled 2023-05-21 (×17): qty 1

## 2023-05-21 MED ORDER — PHENOBARBITAL SODIUM 130 MG/ML IJ SOLN
65.0000 mg | Freq: Three times a day (TID) | INTRAMUSCULAR | Status: DC
  Administered 2023-05-23 (×2): 65 mg via INTRAVENOUS
  Filled 2023-05-21 (×2): qty 1

## 2023-05-21 MED ORDER — PHENOBARBITAL 64.8 MG PO TABS: 64.8000 mg | ORAL_TABLET | Freq: Three times a day (TID) | ORAL | Status: DC

## 2023-05-21 MED ORDER — FOLIC ACID 1 MG PO TABS
1.0000 mg | ORAL_TABLET | Freq: Every day | ORAL | Status: DC
  Administered 2023-05-22 – 2023-05-24 (×3): 1 mg via ORAL
  Filled 2023-05-21 (×3): qty 1

## 2023-05-21 MED ORDER — SODIUM CHLORIDE 0.9% FLUSH
3.0000 mL | Freq: Two times a day (BID) | INTRAVENOUS | Status: DC
  Administered 2023-05-21 – 2023-05-24 (×7): 3 mL via INTRAVENOUS

## 2023-05-21 MED ORDER — HALOPERIDOL LACTATE 5 MG/ML IJ SOLN: 5.0000 mg | Freq: Four times a day (QID) | INTRAMUSCULAR | Status: DC | PRN

## 2023-05-21 MED ORDER — HYDROXYZINE HCL 25 MG PO TABS
25.0000 mg | ORAL_TABLET | Freq: Three times a day (TID) | ORAL | Status: DC | PRN
  Administered 2023-05-22: 25 mg via ORAL
  Filled 2023-05-21: qty 1

## 2023-05-21 MED ORDER — PHENOBARBITAL SODIUM 130 MG/ML IJ SOLN
97.5000 mg | Freq: Three times a day (TID) | INTRAMUSCULAR | Status: AC
  Administered 2023-05-21 – 2023-05-22 (×5): 97.5 mg via INTRAVENOUS
  Filled 2023-05-21 (×5): qty 1

## 2023-05-21 MED ORDER — PHENOBARBITAL SODIUM 65 MG/ML IJ SOLN: 32.5000 mg | Freq: Three times a day (TID) | INTRAMUSCULAR | Status: DC

## 2023-05-21 MED ORDER — MIRTAZAPINE 15 MG PO TABS
15.0000 mg | ORAL_TABLET | Freq: Every day | ORAL | Status: DC
  Administered 2023-05-21 – 2023-05-23 (×3): 15 mg via ORAL
  Filled 2023-05-21 (×3): qty 1

## 2023-05-21 MED ORDER — LORAZEPAM 2 MG/ML IJ SOLN
1.0000 mg | INTRAMUSCULAR | Status: DC | PRN
  Administered 2023-05-21: 4 mg via INTRAVENOUS
  Administered 2023-05-21: 1 mg via INTRAVENOUS
  Filled 2023-05-21: qty 2
  Filled 2023-05-21: qty 1

## 2023-05-21 MED ORDER — ESCITALOPRAM OXALATE 20 MG PO TABS
20.0000 mg | ORAL_TABLET | Freq: Every day | ORAL | Status: DC
  Administered 2023-05-22: 20 mg via ORAL
  Filled 2023-05-21: qty 1

## 2023-05-21 MED ORDER — THIAMINE MONONITRATE 100 MG PO TABS
100.0000 mg | ORAL_TABLET | Freq: Every day | ORAL | Status: DC
  Administered 2023-05-22 – 2023-05-24 (×3): 100 mg via ORAL
  Filled 2023-05-21 (×3): qty 1

## 2023-05-21 MED ORDER — ADULT MULTIVITAMIN W/MINERALS CH: 1.0000 | ORAL_TABLET | Freq: Every day | ORAL | Status: DC

## 2023-05-21 MED ORDER — PHENOBARBITAL 32.4 MG PO TABS: 32.4000 mg | ORAL_TABLET | Freq: Three times a day (TID) | ORAL | Status: DC

## 2023-05-21 NOTE — ED Notes (Signed)
Attempted to get temperature, pt refused and told me to shut up.

## 2023-05-21 NOTE — ED Notes (Signed)
ED TO INPATIENT HANDOFF REPORT  ED Nurse Name and Phone #: Rebecca Eaton RN   S Name/Age/Gender Jermaine [name] 57 y.o. male Room/Bed: WA16/WA16  Code Status   Code Status: Prior  Home/SNF/Other Home Patient oriented to: self, place, time, and situation Is this baseline? Yes   Triage Complete: Triage complete  Chief Complaint Alcohol withdrawal [facility]) [F10.939]  Triage Note Pt brought by the security officer from home. As per the officer, relative called them because pt is alcohol intoxicated and threatened to kill them and hurt his  self, but he denied it with the police officer. Pt anxious and unstable.    Allergies Allergies  Allergen Reactions   Ketamine     Hx of causing fever in 2024     Level of Care/Admitting Diagnosis ED Disposition     ED Disposition  Admit   Condition  --   Comment  Hospital Area: [facility] [[id number]]  Level of Care: Stepdown [14]  Admit to SDU based on following criteria: Severe physiological/psychological symptoms:  Any diagnosis requiring assessment & intervention at least every 4 hours on an ongoing basis to obtain desired patient outcomes including stability and rehabilitation  May admit patient to [name] or [facility] if equivalent level of care is available:: Yes  Covid Evaluation: Asymptomatic - no recent exposure (last 10 days) testing not required  Diagnosis: Alcohol withdrawal (HCC) [291.81.ICD-9-CM]  Admitting Physician: Dolly Rias [[id number]]  Attending Physician: Dolly Rias [[id number]]  Certification:: I certify this patient will need inpatient services for at least 2 midnights  Expected Medical Readiness: 05/24/2023          B Medical/Surgery History Past Medical History:  Diagnosis Date   Alcohol abuse    Anxiety    Class 1 obesity 09/14/2021   Depression    Past Surgical History:  Procedure Laterality Date   HERNIA REPAIR       A IV Location/Drains/Wounds Patient  Lines/Drains/Airways Status     Active Line/Drains/Airways     Name Placement date Placement time Site Days   Peripheral IV 05/20/23 20 G Left Antecubital 05/20/23  2135  Antecubital  1            Intake/Output Last 24 hours  Intake/Output Summary (Last 24 hours) at 05/21/2023 0431 Last data filed at 05/20/2023 2032 Gross per 24 hour  Intake 499.5 ml  Output --  Net 499.5 ml    Labs/Imaging Results for orders placed or performed during the hospital encounter of 05/20/23 (from the past 48 hours)  Urine rapid drug screen (hosp performed)     Status: Abnormal   Collection Time: 05/20/23 12:05 AM  Result Value Ref Range   Opiates NONE DETECTED NONE DETECTED   Cocaine NONE DETECTED NONE DETECTED   Benzodiazepines POSITIVE (A) NONE DETECTED   Amphetamines NONE DETECTED NONE DETECTED   Tetrahydrocannabinol NONE DETECTED NONE DETECTED   Barbiturates POSITIVE (A) NONE DETECTED    Comment: (NOTE) DRUG SCREEN FOR MEDICAL PURPOSES ONLY.  IF CONFIRMATION IS NEEDED FOR ANY PURPOSE, NOTIFY LAB WITHIN 5 DAYS.  LOWEST DETECTABLE LIMITS FOR URINE DRUG SCREEN Drug Class                     Cutoff (ng/mL) Amphetamine and metabolites    1000 Barbiturate and metabolites    200 Benzodiazepine                 200 Opiates and metabolites  300 Cocaine and metabolites        300 THC                            50 Performed at [facility], [address], [place], [place] [address]   Comprehensive metabolic panel     Status: Abnormal   Collection Time: 05/20/23  8:00 PM  Result Value Ref Range   Sodium 129 (L) 135 - 145 mmol/L   Potassium 3.8 3.5 - 5.1 mmol/L   Chloride 92 (L) 98 - 111 mmol/L   CO2 20 (L) 22 - 32 mmol/L   Glucose, Bld 77 70 - 99 mg/dL    Comment: Glucose reference range applies only to samples taken after fasting for at least 8 hours.   BUN 13 6 - 20 mg/dL   Creatinine, Ser 6.04 0.61 - 1.24 mg/dL   Calcium 8.8 (L) 8.9 - 10.3 mg/dL   Total  Protein 7.7 6.5 - 8.1 g/dL   Albumin 4.8 3.5 - 5.0 g/dL   AST 90 (H) 15 - 41 U/L   ALT 176 (H) 0 - 44 U/L   Alkaline Phosphatase 58 38 - 126 U/L   Total Bilirubin 0.8 <1.2 mg/dL   GFR, Estimated >54 >09 mL/min    Comment: (NOTE) Calculated using the CKD-EPI Creatinine Equation (2021)    Anion gap 17 (H) 5 - 15    Comment: Performed at [facility], [address] [place], [place] [address]  Ethanol     Status: Abnormal   Collection Time: 05/20/23  8:00 PM  Result Value Ref Range   Alcohol, Ethyl (B) 344 (HH) <10 mg/dL    Comment: CRITICAL RESULT CALLED TO, READ BACK BY AND VERIFIED WITH Lawrence Marseilles, PARAMEDIC 05/20/23 2034 BY K. DAVIS (NOTE) Lowest detectable limit for serum alcohol is 10 mg/dL.  For medical purposes only. Performed at [facility], [address] [place], [place] [address]   CBC with Diff     Status: Abnormal   Collection Time: 05/20/23  8:00 PM  Result Value Ref Range   WBC 7.6 4.0 - 10.5 K/uL   RBC 4.91 4.22 - 5.81 MIL/uL   Hemoglobin 16.6 13.0 - 17.0 g/dL   HCT 56.2 13.0 - 86.5 %   MCV 96.5 80.0 - 100.0 fL   MCH 33.8 26.0 - 34.0 pg   MCHC 35.0 30.0 - 36.0 g/dL   RDW 78.4 69.6 - 29.5 %   Platelets 191 150 - 400 K/uL   nRBC 0.0 0.0 - 0.2 %   Neutrophils Relative % 52 %   Neutro Abs 4.0 1.7 - 7.7 K/uL   Lymphocytes Relative 32 %   Lymphs Abs 2.4 0.7 - 4.0 K/uL   Monocytes Relative 13 %   Monocytes Absolute 1.0 0.1 - 1.0 K/uL   Eosinophils Relative 0 %   Eosinophils Absolute 0.0 0.0 - 0.5 K/uL   Basophils Relative 1 %   Basophils Absolute 0.1 0.0 - 0.1 K/uL   Immature Granulocytes 2 %   Abs Immature Granulocytes 0.13 (H) 0.00 - 0.07 K/uL    Comment: Performed at [facility], [address], [place], [place] [address]   No results found.  Pending Labs Unresulted Labs (From admission, onward)    None       Vitals/Pain Today's Vitals   05/20/23 2229 05/20/23 2345 05/21/23 0330  05/21/23 0335  BP: 131/64 Marland Kitchen)  164/91  118/84  Pulse: 92 98 82 80  Resp:  18  16  Temp:    98.1 F (36.7 C)  TempSrc:    Oral  SpO2:  98% 97% 97%  Weight:      Height:      PainSc:        Isolation Precautions Enteric precautions (UV disinfection)  Medications Medications  LORazepam (ATIVAN) tablet 1-4 mg ( Oral See Alternative 05/21/23 0413)    Or  LORazepam (ATIVAN) injection 1-4 mg (1 mg Intravenous Given 05/21/23 0413)  thiamine (VITAMIN B1) tablet 100 mg (has no administration in time range)    Or  thiamine (VITAMIN B1) injection 100 mg (has no administration in time range)  folic acid (FOLVITE) tablet 1 mg (has no administration in time range)  multivitamin with minerals tablet 1 tablet (has no administration in time range)  sodium chloride 0.9 % bolus 1,000 mL (0 mLs Intravenous Stopped 05/20/23 2032)  LORazepam (ATIVAN) injection 1 mg (1 mg Intravenous Given 05/20/23 1951)  ziprasidone (GEODON) injection 20 mg (20 mg Intramuscular Given 05/20/23 2023)  sterile water (preservative free) injection (  Given 05/20/23 2023)    Mobility walks     Focused Assessments     R Recommendations: See Admitting Provider Note  Report given to:   Additional Notes:

## 2023-05-21 NOTE — ED Notes (Signed)
PRN ativan given for severe agitation/anxiety pt very difficult to redirect.

## 2023-05-21 NOTE — Progress Notes (Addendum)
PROGRESS NOTE    Jermaine [name]  WJX:[id number] DOB: August 01, 1965 DOA: 05/20/2023 PCP: Farris Has, MD   Brief Narrative:  Jermaine [name] is a 57 y.o. male with hx of severe alcohol use disorder, seizures/DT previously, mood disorder, recent diagnosis C. difficile and facial cellulitis, recent admission 12/15-22 for severe alcohol withdrawal ultimately leaving AMA, who returns after brought in by police due to family concern for suicidal ideation /homicidal ideation.  Per EDP notes "Patient was apparently intoxicated, called relative who lives in [place] and made threats against her, said he had a gun. She called the police who came to his house, were not able to locate any gun, patient continued to be agitated making general threats, they brought him to the emergency department. Patient denies any suicidal ideation but endorses homicidal ideation towards "everybody". He reports "I want some help".    On interview with the patient he is agitated and thus interview was limited.  Initially standing gathering his belongings stating that he is going to leave the hospital.  Upon further discussion about recommendation for treatment with alcohol withdrawal and plan he is amenable to staying.  Reports drinking 2/5 of liquor per day since his discharge.  Feels like he is going to die unless gets more aggressive treatment of his alcohol withdrawal, and wanted to leave to drink and curb his withdrawal symptoms.  Other than withdrawal, reports mild abdominal pain.  Had not taken vancomycin p.o. when out of the hospital.  Also had not taken Keflex for facial cellulitis.    Assessment & Plan:   Principal Problem:   Alcohol withdrawal (HCC)  Suicidal/homicidal ideation Had called family reporting that he had a gun express suicidal/homicidal ideation to them.  Prompted police welfare check.  He denies any suicidal or homicidal intent or ideation to me.  Psychiatry consulted and pending evaluation.  Will defer to  psychiatry if IVC is needed.   Severe alcohol withdrawal History of severe alcohol use disorder, previous withdrawal seizures/DT Last admission initially managed with Librium but progressive worsening withdrawal and ultimately required Precedex drip, phenobarbital and high doses of Ativan in addition.  Eventually left AMA on the morning of 05/19/2023 and started drinking right off the bat.  He tells me that he drank a whole case of 24 beers plus a bottle of vodka.  He has been started on phenobarbital tapering dose and as needed Ativan per [facility]WA protocol here.  Lactic acidosis/dehydration: Lactic acidosis likely secondary to dehydration.  Will provide with IV fluid bolus and repeat lactic acid this afternoon.   History recent C. difficile colitis Diagnosed 12/17, left AMA 12/22.  Did not take antibiotics while outpatient.  Resumed vancomycin here.   History recent facial cellulitis, nasal fracture  Did not take antibiotics while outpatient.  Improving.  Resumed Keflex here.   Chronic liver injury, likely alcoholic liver disease  -Trend LFT   History of mood disorder/anxiety/depression:  -Continue home Lexapro, hydroxyzine as needed  Nasal trauma: Has vertical nasal slate, says that he fell recently and this was glued after he sought medical attention.  Appears to be healing well.   Acute thrombocytopenia: Secondary to alcoholism.  No signs of bleeding.  Platelets improved.  DVT prophylaxis: enoxaparin (LOVENOX) injection 40 mg Start: 05/21/23 1000   Code Status: Full Code  Family Communication:  None present at bedside.  Plan of care discussed with patient in length and he/she verbalized understanding and agreed with it.  Status is: Inpatient Remains inpatient appropriate because: At risk of  severe alcohol withdrawal.   Estimated body mass index is 27.01 kg/m as calculated from the following:   Height as of this encounter: 5\' 10"  (1.778 m).   Weight as of this encounter: 85.4  kg.    Nutritional Assessment: Body mass index is 27.01 kg/m.Marland Kitchen Seen by dietician.  I agree with the assessment and plan as outlined below: Nutrition Status:        . Skin Assessment: I have examined the patient's skin and I agree with the wound assessment as performed by the wound care RN as outlined below:    Consultants:  Psychiatry  Procedures:  None  Antimicrobials:  Anti-infectives (From admission, onward)    Start     Dose/Rate Route Frequency Ordered Stop   05/21/23 1000  vancomycin (VANCOCIN) capsule 125 mg       Note to Pharmacy: Patient taking differently: Patient hasn't started medication, due to a "drinking binge". 05/21/2023.     125 mg Oral 4 times daily 05/21/23 0458 05/27/23 0959   05/21/23 0600  cephALEXin (KEFLEX) capsule 500 mg       Note to Pharmacy: Patient taking differently: Patient hasn't started medication, due to a "drinking binge". 05/21/2023.     500 mg Oral Every 8 hours 05/21/23 0458 05/26/23 0559         Subjective: Patient seen and examined, sitter at the bedside.  Patient is alert and oriented.  Has tremors in the upper extremities.  No complaints.  Denies any suicidal or homicidal ideation.  Does not even recall threatening his sister.  Objective: Vitals:   05/21/23 0833 05/21/23 0900 05/21/23 0940 05/21/23 0945  BP:  104/61 106/63   Pulse: 85 88 89 86  Resp: 17 18  19   Temp:      TempSrc:      SpO2: 92% 93%  93%  Weight:      Height:        Intake/Output Summary (Last 24 hours) at 05/21/2023 1151 Last data filed at 05/21/2023 1147 Gross per 24 hour  Intake 1263.29 ml  Output --  Net 1263.29 ml   Filed Weights   05/20/23 1901 05/21/23 0500  Weight: 86.2 kg 85.4 kg    Examination:  General exam: tremors upper extremities. Respiratory system: Clear to auscultation. Respiratory effort normal. Cardiovascular system: S1 & S2 heard, RRR. No JVD, murmurs, rubs, gallops or clicks. No pedal edema. Gastrointestinal system:  Abdomen is nondistended, soft and nontender. No organomegaly or masses felt. Normal bowel sounds heard. Central nervous system: Alert and oriented. No focal neurological deficits. Extremities: Symmetric 5 x 5 power. Skin: No rashes, lesions or ulcers Psychiatry: Judgement and insight appear poor.   Data Reviewed: I have personally reviewed following labs and imaging studies  CBC: Recent Labs  Lab 05/15/23 0308 05/16/23 0259 05/18/23 0255 05/20/23 2000 05/21/23 0636  WBC 5.1 5.3 7.5 7.6 4.8  NEUTROABS 3.3 3.2 4.6 4.0  --   HGB 15.6 16.6 16.0 16.6 15.7  HCT 47.6 50.9 48.6 47.4 46.2  MCV 102.1* 100.4* 100.6* 96.5 96.9  PLT 113* 131* 116* 191 193   Basic Metabolic Panel: Recent Labs  Lab 05/17/23 0921 05/18/23 0255 05/19/23 0310 05/20/23 2000 05/21/23 0636  NA 134* 136 135 129* 138  K 3.5 3.9 4.1 3.8 4.1  CL 97* 100 96* 92* 100  CO2 28 28 27  20* 21*  GLUCOSE 130* 87 90 77 58*  BUN 18 15 14 13 13   CREATININE 1.15 0.94 1.07 1.00 0.83  CALCIUM 8.9 9.0 9.1 8.8* 8.5*  MG  --   --   --   --  2.3  PHOS  --   --   --   --  2.6   GFR: Estimated Creatinine Clearance: 101.4 mL/min (by C-G formula based on SCr of 0.83 mg/dL). Liver Function Tests: Recent Labs  Lab 05/17/23 0921 05/18/23 0255 05/19/23 0310 05/20/23 2000 05/21/23 0636  AST 171* 106* 76* 90* 70*  ALT 324* 258* 220* 176* 138*  ALKPHOS 57 50 55 58 51  BILITOT 0.6 0.9 0.8 0.8 0.7  PROT 6.5 6.4* 6.9 7.7 6.6  ALBUMIN 3.8 3.9 4.2 4.8 4.0   No results for input(s): "LIPASE", "AMYLASE" in the last 168 hours. Recent Labs  Lab 05/15/23 0308  AMMONIA 42*   Coagulation Profile: No results for input(s): "INR", "PROTIME" in the last 168 hours. Cardiac Enzymes: No results for input(s): "CKTOTAL", "CKMB", "CKMBINDEX", "TROPONINI" in the last 168 hours. BNP (last 3 results) No results for input(s): "PROBNP" in the last 8760 hours. HbA1C: No results for input(s): "HGBA1C" in the last 72 hours. CBG: Recent Labs   Lab 05/18/23 1542 05/18/23 2026 05/19/23 0007 05/19/23 0309 05/19/23 0754  GLUCAP 82 114* 85 88 95   Lipid Profile: No results for input(s): "CHOL", "HDL", "LDLCALC", "TRIG", "CHOLHDL", "LDLDIRECT" in the last 72 hours. Thyroid Function Tests: No results for input(s): "TSH", "T4TOTAL", "FREET4", "T3FREE", "THYROIDAB" in the last 72 hours. Anemia Panel: No results for input(s): "VITAMINB12", "FOLATE", "FERRITIN", "TIBC", "IRON", "RETICCTPCT" in the last 72 hours. Sepsis Labs: Recent Labs  Lab 05/21/23 0636  LATICACIDVEN 3.2*    Recent Results (from the past 240 hours)  C Difficile Quick Screen w PCR reflex     Status: Abnormal   Collection Time: 05/14/23  6:19 PM   Specimen: STOOL  Result Value Ref Range Status   C Diff antigen POSITIVE (A) NEGATIVE Final   C Diff toxin NEGATIVE NEGATIVE Final   C Diff interpretation Results are indeterminate. See PCR results.  Final    Comment: Performed at [facility], [address] [place], [place] [address]  Gastrointestinal Panel by PCR , Stool     Status: None   Collection Time: 05/14/23  6:19 PM   Specimen: STOOL  Result Value Ref Range Status   Campylobacter species NOT DETECTED NOT DETECTED Final   Plesimonas shigelloides NOT DETECTED NOT DETECTED Final   Salmonella species NOT DETECTED NOT DETECTED Final   Yersinia enterocolitica NOT DETECTED NOT DETECTED Final   Vibrio species NOT DETECTED NOT DETECTED Final   Vibrio cholerae NOT DETECTED NOT DETECTED Final   Enteroaggregative E coli (EAEC) NOT DETECTED NOT DETECTED Final   Enteropathogenic E coli (EPEC) NOT DETECTED NOT DETECTED Final   Enterotoxigenic E coli (ETEC) NOT DETECTED NOT DETECTED Final   Shiga like toxin producing E coli (STEC) NOT DETECTED NOT DETECTED Final   Shigella/Enteroinvasive E coli (EIEC) NOT DETECTED NOT DETECTED Final   Cryptosporidium NOT DETECTED NOT DETECTED Final   Cyclospora cayetanensis NOT DETECTED NOT DETECTED Final    Entamoeba histolytica NOT DETECTED NOT DETECTED Final   Giardia lamblia NOT DETECTED NOT DETECTED Final   Adenovirus F40/41 NOT DETECTED NOT DETECTED Final   Astrovirus NOT DETECTED NOT DETECTED Final   Norovirus GI/GII NOT DETECTED NOT DETECTED Final   Rotavirus A NOT DETECTED NOT DETECTED Final   Sapovirus (I, II, IV, and V) NOT DETECTED NOT DETECTED Final    Comment: Performed at [facility]  Lab, [address], [place], [place] [address]  C. Diff by PCR, Reflexed     Status: Abnormal   Collection Time: 05/14/23  6:19 PM  Result Value Ref Range Status   Toxigenic C. Difficile by PCR POSITIVE (A) NEGATIVE Final    Comment: Positive for toxigenic C. difficile with little to no toxin production. Only treat if clinical presentation suggests symptomatic illness. Performed at [facility], [address], [place], [place] [address]   MRSA Next Gen by PCR, Nasal     Status: None   Collection Time: 05/14/23  7:31 PM   Specimen: Nasal Mucosa; Nasal Swab  Result Value Ref Range Status   MRSA by PCR Next Gen NOT DETECTED NOT DETECTED Final    Comment: (NOTE) The GeneXpert MRSA Assay (FDA approved for NASAL specimens only), is one component of a comprehensive MRSA colonization surveillance program. It is not intended to diagnose MRSA infection nor to guide or monitor treatment for MRSA infections. Test performance is not FDA approved in patients less than 58 years old. Performed at [facility], [address] [place], [place] [address]   MRSA Next Gen by PCR, Nasal     Status: None   Collection Time: 05/21/23  5:46 AM   Specimen: Nasal Mucosa; Nasal Swab  Result Value Ref Range Status   MRSA by PCR Next Gen NOT DETECTED NOT DETECTED Final    Comment: (NOTE) The GeneXpert MRSA Assay (FDA approved for NASAL specimens only), is one component of a comprehensive MRSA colonization surveillance program. It is not intended to diagnose MRSA infection nor to  guide or monitor treatment for MRSA infections. Test performance is not FDA approved in patients less than 18 years old. Performed at [facility], [address], [place], [place] [address]      Radiology Studies: No results found.  Scheduled Meds:  cephALEXin  500 mg Oral Q8H   Chlorhexidine Gluconate Cloth  6 each Topical Daily   enoxaparin (LOVENOX) injection  40 mg Subcutaneous Q24H   escitalopram  20 mg Oral Daily   folic acid  1 mg Oral Daily   melatonin  9 mg Oral QHS   multivitamin with minerals  1 tablet Oral Daily   PHENObarbital  97.5 mg Intravenous Q8H   Followed by   Melene Muller ON 05/23/2023] PHENObarbital  65 mg Intravenous Q8H   Followed by   Melene Muller ON 05/25/2023] PHENObarbital  32.5 mg Intravenous Q8H   sodium chloride flush  3 mL Intravenous Q12H   thiamine  100 mg Oral Daily   vancomycin  125 mg Oral QID   Continuous Infusions:   LOS: 0 days   Time spent 33 minutes Hughie Closs, MD [facility]  05/21/2023, 11:51 AM   *Please note that this is a verbal dictation therefore any spelling or grammatical errors are due to the "[employer]agon [employer] One" system interpretation.  Please page via Amion and do not message via secure chat for urgent patient care matters. Secure chat can be used for non urgent patient care matters.  How to contact the [facility] Attending or Consulting provider 7A - 7P or covering provider during after hours 7P -7A, for this patient?  Check the care team in [facility] and look for a) attending/consulting [facility] provider listed and b) the [facility] team listed. Page or secure chat 7A-7P. Log into [email or web address] and use [place]'s universal password to access. If you do not have the password, please contact the hospital  operator. Locate the [facility] provider you are looking for under [facility] and page to a number that you can be directly reached. If you still have difficulty reaching the provider, please page the [facility] (Director on  Call) for the Hospitalists listed on amion for assistance.

## 2023-05-21 NOTE — H&P (Addendum)
History and Physical    Jermaine [name] WUX:[id number] DOB: [birth year] DOA: 05/20/2023  PCP: Farris Has, MD   Patient coming from: Home   Chief Complaint:  Chief Complaint  Patient presents with   Alcohol Intoxication    HPI:  Jermaine [name] is a 57 y.o. male with hx of severe alcohol use disorder, seizures/DT previously, mood disorder, recent diagnosis C. difficile and facial cellulitis, recent admission 12/15-22 for severe alcohol withdrawal ultimately leaving AMA, who returns after brought in by police due to family concern for suicidal ideation /homicidal ideation.  Per EDP notes "Patient was apparently intoxicated, called relative who lives in [place] and made threats against her, said he had a gun. She called the police who came to his house, were not able to locate any gun, patient continued to be agitated making general threats, they brought him to the emergency department. Patient denies any suicidal ideation but endorses homicidal ideation towards "everybody". He reports "I want some help".   On interview with the patient he is agitated and thus interview was limited.  Initially standing gathering his belongings stating that he is going to leave the hospital.  Upon further discussion about recommendation for treatment with alcohol withdrawal and plan he is amenable to staying.  Reports drinking 2/5 of liquor per day since his discharge.  Feels like he is going to die unless gets more aggressive treatment of his alcohol withdrawal, and wanted to leave to drink and curb his withdrawal symptoms.  Other than withdrawal, reports mild abdominal pain.  Had not taken vancomycin p.o. when out of the hospital.  Also had not taken Keflex for facial cellulitis.   Review of Systems:  ROS complete and negative except as marked above   Allergies  Allergen Reactions   Ketamine     Hx of causing fever in 2024     Prior to Admission medications   Medication Sig Start Date End Date Taking?  Authorizing Provider  escitalopram (LEXAPRO) 20 MG tablet Take 20 mg by mouth daily. 02/27/19  Yes [provider]  cephALEXin (KEFLEX) 500 MG capsule Take 1 capsule (500 mg total) by mouth every 8 (eight) hours for 6 days. Patient taking differently: Take 500 mg by mouth every 8 (eight) hours. Patient hasn't started medication, due to a "drinking binge". 05/21/2023. 05/19/23 05/25/23  Hughie Closs, MD  folic acid (FOLVITE) 1 MG tablet Take 1 tablet (1 mg total) by mouth daily. Patient not taking: Reported on 05/21/2023 01/28/23   Alberteen Sam, MD  gabapentin (NEURONTIN) 400 MG capsule Take 400 mg by mouth 3 (three) times daily. Patient not taking: Reported on 05/21/2023 01/28/23   [provider]  hydrOXYzine (ATARAX) 25 MG tablet Take 1 tablet (25 mg total) by mouth 3 (three) times daily as needed for itching or anxiety. Patient not taking: Reported on 05/21/2023 01/28/23   Alberteen Sam, MD  Multiple Vitamins-Minerals (MULTI VITAMIN/MINERALS) TABS Take 1 tablet by mouth daily. Patient not taking: Reported on 05/21/2023    [provider]  naltrexone (DEPADE) 50 MG tablet Take 50 mg by mouth daily. Patient not taking: Reported on 05/21/2023    [provider]  thiamine (VITAMIN B-1) 100 MG tablet Take 1 tablet (100 mg total) by mouth daily. Patient not taking: Reported on 05/21/2023 05/20/22   [name], MD  thiamine (VITAMIN B1) 100 MG tablet Take 100 mg by mouth daily. Patient not taking: Reported on 05/21/2023 01/31/23   [provider]  vancomycin (VANCOCIN) 125  MG capsule Take 1 capsule (125 mg total) by mouth 4 (four) times daily for 6 days. Patient taking differently: Take 125 mg by mouth 4 (four) times daily. Patient hasn't started medication, due to a "drinking binge". 05/21/2023. 05/19/23 05/25/23  Hughie Closs, MD    Past Medical History:  Diagnosis Date   Alcohol abuse    Anxiety    Class 1 obesity 09/14/2021    Depression     Past Surgical History:  Procedure Laterality Date   HERNIA REPAIR       reports that he has never smoked. He has never used smokeless tobacco. He reports current alcohol use of about 20.0 standard drinks of alcohol per week. He reports that he does not currently use drugs.  Family History  Adopted: Yes     Physical Exam: Vitals:   05/21/23 0335 05/21/23 0500 05/21/23 0537 05/21/23 0600  BP: 118/84  (!) 142/91 110/76  Pulse: 80  92 87  Resp: 16   18  Temp: 98.1 F (36.7 C)     TempSrc: Oral     SpO2: 97%   96%  Weight:  85.4 kg    Height:        Gen: Awake, alert, chronically ill-appearing HEENT: No asymmetric edema or erythema over his face.  Facial scars. CV: Regular, normal S1, S2, no murmurs  Resp: Normal WOB, CTAB  Abd: Flat, normoactive, minimal tenderness MSK: Symmetric, no edema  Skin: No rashes or lesions to exposed skin  Neuro: Alert and interactive, fully oriented Psych: Irritable, agitated   Data review:   Labs reviewed, notable for:   NA 129 Bicarb 20, anion gap 17 AST 90, ALT 176, other LFT normal Alcohol 344 Tox positive barbiturate/benzodiazepine  Micro:  Results for orders placed or performed during the hospital encounter of 05/12/23  C Difficile Quick Screen w PCR reflex     Status: Abnormal   Collection Time: 05/14/23  6:19 PM   Specimen: STOOL  Result Value Ref Range Status   C Diff antigen POSITIVE (A) NEGATIVE Final   C Diff toxin NEGATIVE NEGATIVE Final   C Diff interpretation Results are indeterminate. See PCR results.  Final    Comment: Performed at [facility], [address] [place], [place] [address]  Gastrointestinal Panel by PCR , Stool     Status: None   Collection Time: 05/14/23  6:19 PM   Specimen: STOOL  Result Value Ref Range Status   Campylobacter species NOT DETECTED NOT DETECTED Final   Plesimonas shigelloides NOT DETECTED NOT DETECTED Final   Salmonella species NOT DETECTED NOT  DETECTED Final   Yersinia enterocolitica NOT DETECTED NOT DETECTED Final   Vibrio species NOT DETECTED NOT DETECTED Final   Vibrio cholerae NOT DETECTED NOT DETECTED Final   Enteroaggregative E coli (EAEC) NOT DETECTED NOT DETECTED Final   Enteropathogenic E coli (EPEC) NOT DETECTED NOT DETECTED Final   Enterotoxigenic E coli (ETEC) NOT DETECTED NOT DETECTED Final   Shiga like toxin producing E coli (STEC) NOT DETECTED NOT DETECTED Final   Shigella/Enteroinvasive E coli (EIEC) NOT DETECTED NOT DETECTED Final   Cryptosporidium NOT DETECTED NOT DETECTED Final   Cyclospora cayetanensis NOT DETECTED NOT DETECTED Final   Entamoeba histolytica NOT DETECTED NOT DETECTED Final   Giardia lamblia NOT DETECTED NOT DETECTED Final   Adenovirus F40/41 NOT DETECTED NOT DETECTED Final   Astrovirus NOT DETECTED NOT DETECTED Final   Norovirus GI/GII NOT DETECTED NOT DETECTED Final   Rotavirus A  NOT DETECTED NOT DETECTED Final   Sapovirus (I, II, IV, and V) NOT DETECTED NOT DETECTED Final    Comment: Performed at [facility], [address] [place], [place] [address]  C. Diff by PCR, Reflexed     Status: Abnormal   Collection Time: 05/14/23  6:19 PM  Result Value Ref Range Status   Toxigenic C. Difficile by PCR POSITIVE (A) NEGATIVE Final    Comment: Positive for toxigenic C. difficile with little to no toxin production. Only treat if clinical presentation suggests symptomatic illness. Performed at [facility], [address], [place], [place] [address]   MRSA Next Gen by PCR, Nasal     Status: None   Collection Time: 05/14/23  7:31 PM   Specimen: Nasal Mucosa; Nasal Swab  Result Value Ref Range Status   MRSA by PCR Next Gen NOT DETECTED NOT DETECTED Final    Comment: (NOTE) The GeneXpert MRSA Assay (FDA approved for NASAL specimens only), is one component of a comprehensive MRSA colonization surveillance program. It is not intended to diagnose MRSA infection nor to guide or  monitor treatment for MRSA infections. Test performance is not FDA approved in patients less than 19 years old. Performed at [facility], [address], [place], [place] [address]     Imaging reviewed:  No results found.    ED Course:  Treated with Ativan total of 2 mg IV, Geodon 20 mg IM, 1 L normal saline   Assessment/Plan:  57 y.o. male with hx severe alcohol use disorder, seizures/DT previously, mood disorder, recent diagnosis C. difficile and facial cellulitis, recent admission 12/15-22 for severe alcohol withdrawal ultimately leaving AMA, who returns after brought in by police due to family concern for suicidal ideation /homicidal ideation.  Suicidal/homicidal ideation Had called family reporting that he had a gun express suicidal/homicidal ideation to them.  Prompted police welfare check, denying SI, expressing HI.  -Secondary school teacher, suicide precautions -Psychiatry consult ordered  Severe alcohol withdrawal History of severe alcohol use disorder, previous withdrawal seizures/DT Last admission initially managed with Librium but progressive worsening withdrawal and ultimately required Precedex drip, phenobarbital and high doses of Ativan in addition.  Last drink 12/23 evening, reports drinking 2/5 of liquor per day.  -Due to high risk alcohol withdrawal, started on phenobarbital.  Discussed with pharmacy recommending for 97.5 mg IV every 8 hours -> 65 mg every 8 hours -> 32.5 mg every 8 hours.  -In addition continue CIWA, Ativan as needed -Multivitamin, thiamine, folate -Consider resuming naltrexone if LFT stable/improving -TOC consult for substance use disorder  History recent C. difficile colitis Diagnosed 12/17, left AMA 12/22.  Did not take antibiotics while outpatient. - Resume vancomycin 125 mg p.o. 4 times daily for 6 additional days  History recent facial cellulitis, nasal fracture  Did not take antibiotics while outpatient. - Resume Keflex  500 mg every 8 hour for 5 additional days  Chronic liver injury, likely alcoholic liver disease  -Trend LFT  History of mood disorder:  -Continue home Lexapro, hydroxyzine as needed  Body mass index is 27.01 kg/m.    DVT prophylaxis:  Lovenox Code Status:  Full Code Diet:  Diet Orders (From admission, onward)     Start     Ordered   05/21/23 0455  Diet regular Room service appropriate? Yes; Fluid consistency: Thin  Diet effective now       Question Answer Comment  Room service appropriate? Yes   Fluid consistency: Thin  05/21/23 0457           Family Communication:  No   Consults:  Psychiatry   Admission status:   Inpatient, Step Down Unit  Severity of Illness: The appropriate patient status for this patient is INPATIENT. Inpatient status is judged to be reasonable and necessary in order to provide the required intensity of service to ensure the patient's safety. The patient's presenting symptoms, physical exam findings, and initial radiographic and laboratory data in the context of their chronic comorbidities is felt to place them at high risk for further clinical deterioration. Furthermore, it is not anticipated that the patient will be medically stable for discharge from the hospital within 2 midnights of admission.   * I certify that at the point of admission it is my clinical judgment that the patient will require inpatient hospital care spanning beyond 2 midnights from the point of admission due to high intensity of service, high risk for further deterioration and high frequency of surveillance required.*   Dolly Rias, MD [facility]  How to contact the [facility] Attending or Consulting provider 7A - 7P or covering provider during after hours 7P -7A, for this patient.  Check the care team in [facility] and look for a) attending/consulting TRH provider listed and b) the [facility] team listed Log into [email or web address] and use [facility]'s universal password to access. If  you do not have the password, please contact the hospital operator. Locate the [facility] provider you are looking for under [facility] and page to a number that you can be directly reached. If you still have difficulty reaching the provider, please page the [facility] (Director on Call) for the Hospitalists listed on amion for assistance.  05/21/2023, 6:55 AM

## 2023-05-21 NOTE — Plan of Care (Signed)
  Problem: Health Behavior/Discharge Planning: Goal: Ability to identify changes in lifestyle to reduce recurrence of condition will improve Outcome: Progressing Goal: Identification of resources available to assist in meeting health care needs will improve Outcome: Progressing

## 2023-05-21 NOTE — Consult Note (Addendum)
Brief Psychiatry Consult Note Caesar Chestnut  8:00 Patient was sleeping when attempting to assess Will attempt to re-evaluate tomorrow.   13:00 Patient more alert. He is unable to recall much of what led to his hospitalization. He reports binge drinking prior to returning to hospital but has no recollection of why police brought him in. He reports currently not willing to abstain from alcohol. Denies present SI/HI/AVH. Has some feelings of depression but more so anxiety. He wants to get back into AA and get in touch with his sponsor who he has not spoken to in 4 months. Currently, he lives by himself. He does own a firearm but it is locked away in a safe. He was AxOx3 to self, location, time.  Collateral Caesar Chestnut (sister) @ [phone] on 05/21/23 Patient has struggled with severe alcohol use for past 6 months.  During Thanksgiving, he had visited sister and mom in [place] and had reportedly been sober and appropriately behaving but then told friend after return he had "hid his drinking well". He has attended multiple substance use rehab programs and they do not appear to have been effective.  Patient does not report SI/HI when he is sober.  After leaving AMA last hospitalization, he immediately restarted drinking alcohol.  2 days ago, he called mom and stated that he "needed $15,000 to travel the world" and that "it was his money anyways".  Mother currently resides with sister in [place] due to medical problems. This was denied to him but sister was agreeable to paying his rent and bills. Yesterday, patient spoke with sister and mom and told mom to "shut up" and told sister "when I go down there, I'm going to kick your ass and put you in a chokehold". He also stated "all I've got is a 9 mm now".  He was finally agreeable to going back to the hospital to undergo medical detox for his alcohol use but due to concern for safety, sister called EMS for patient due to his level of belligerence and agitation over  the phone.  Sister states that patient reportedly had set up a noose and tied it to a chair as a suicidal gesture and after completing detox at the hospital, he was transferred to [facility] in [place] for a week.  Sister reports that patient's experience there was horrific and reports that it was not helpful at all citing patient feeling fearful of his own life at the psychiatric hospital. Sister does not want patient to go to psychiatric hospital if at all possible.  I discussed that I would follow up with patient after he is more alert but set  expectations that residential rehab and most substance use treatment facilities are voluntary and patient has to be willing to quit alcohol. Sister reports understanding. All questions addressed.   Assessment Briefly, Ryan Brockschmidt is a 57 y.o. male with hx of severe alcohol use disorder c/b DT's and withdrawal seizures and suspected alcohol induced mood disorder admitted on 05/20/2023  6:58 PM for severe alcohol withdrawal and SI/HI. He had left AMA after hospitalziation 12/15-12/22 and immediately began drinking 2/5 of liquor prior to returning to hospital 05/20/23. Patient was started on phenobarb taper and has required high doses of ativan to manage patient's agitation likely secondary to alcohol withdrawal.   Psychiatry was consulted due to SI. Will proceed with IVC due to homicidal and possible suicidal statements he made. He is currently on 1:1 sitter as safety precautions. Per sister, patient only makes self harm  or HI statements when severely intoxicated. She does not suspect he is a danger to self or other when sober.   Plan: Severe Alcohol Use Disorder -continue 1:1 sitter -IM haloperidol 5 mg q6h prn for agitation. ECG 05/20/23 Qtc 472.  -mirtazapine 15 mg at bedtime   Safety -IVC'd due to statements of self-harm and harm to others -will advise any firearms be removed from house  Psychiatry will continue to follow at this time.  Park Pope, MD Psychiatry Resident, PGY3

## 2023-05-21 NOTE — TOC Progression Note (Addendum)
Transition of Care [facility]) - Progression Note    Patient Details  Name: Jermaine [name] MRN: [id number] Date of Birth: 01/04/1966  Transition of [facility]) CM/SW Contact  Adrian Prows, RN Phone Number: 05/21/2023, 1:55 PM  Clinical Narrative:    Orders received for IVC; Affidavit and Petition for Involuntary Commitment and First Examination for Involuntary Commitment e-filed by Donato Schultz, CSW ; envelope # [phone]; [facility] Christella Hartigan confirmed receipt of documents; awaiting return documents.  -1517- Custody Certification, and Findings and Custody Order Involuntary Commitment received (File # [id number]); spoke w/ operator # [phone] at [facility]; officer will be sent to serve pt; pt served at 1530; original and 3 copies of documents placed in orange folder on pt's shadow chart on unit; IVC expires 05/28/23 at 1530.      Expected Discharge Plan and Services                                               Social Determinants of Health (SDOH) Interventions SDOH Screenings   Food Insecurity: No Food Insecurity (05/21/2023)  Housing: Low Risk  (05/21/2023)  Transportation Needs: No Transportation Needs (05/21/2023)  Utilities: Not At Risk (05/21/2023)  Recent Concern: Utilities - At Risk (05/02/2023)  Tobacco Use: Low Risk  (05/12/2023)    Readmission Risk Interventions    05/13/2023    2:46 PM 01/17/2023    4:03 PM 01/14/2023    1:49 PM  Readmission Risk Prevention Plan  Post Dischage Appt   Complete  Medication Screening   Complete  Transportation Screening Complete Complete Complete  PCP or Specialist Appt within 5-7 Days  Complete   PCP or Specialist Appt within 3-5 Days Complete    Home Care Screening  Complete   Medication Review (RN CM)  Complete   HRI or Home Care Consult Complete    Social Work Consult for Recovery Care Planning/Counseling Complete    Palliative Care Screening Not Applicable    Medication  Review Oceanographer) Complete

## 2023-05-21 NOTE — ED Provider Notes (Signed)
  Physical Exam  BP 118/84   Pulse 80   Temp 98.1 F (36.7 C) (Oral)   Resp 16   Ht 5\' 10"  (1.778 m)   Wt 86.2 kg   SpO2 97%   BMI 27.26 kg/m   Physical Exam Constitutional:      General: He is not in acute distress.    Appearance: Normal appearance.  HENT:     Head: Normocephalic and atraumatic.     Nose: No congestion or rhinorrhea.  Eyes:     General:        Right eye: No discharge.        Left eye: No discharge.     Extraocular Movements: Extraocular movements intact.     Pupils: Pupils are equal, round, and reactive to light.  Cardiovascular:     Rate and Rhythm: Normal rate and regular rhythm.     Heart sounds: No murmur heard. Pulmonary:     Effort: No respiratory distress.     Breath sounds: No wheezing or rales.  Abdominal:     General: There is no distension.     Tenderness: There is no abdominal tenderness.  Musculoskeletal:        General: Normal range of motion.     Cervical back: Normal range of motion.  Skin:    General: Skin is warm and dry.  Neurological:     General: No focal deficit present.     Mental Status: He is alert.  Psychiatric:     Comments: Agitated, tremulous     Procedures  Procedures  ED Course / MDM   Clinical Course as of 05/21/23 0349  Mon May 20, 2023  2317 Signed out to Dr. Posey Rea pending sobriety and re-assessment for improvement in behavior. D [WS]    Clinical Course User Index [WS] Lonell Grandchild, MD   Medical Decision Making Amount and/or Complexity of Data Reviewed Labs: ordered.  Risk OTC drugs. Prescription drug management. Decision regarding hospitalization.   Patient received in handoff.  Alcohol intoxication pending metabolization.  Patient started to withdrawal here in the emergency department and recently left AMA for alcohol intoxication.  Will require readmission for alcohol withdrawal       Glendora Score, MD 05/21/23 [phone]

## 2023-05-22 DIAGNOSIS — F1094 Alcohol use, unspecified with alcohol-induced mood disorder: Secondary | ICD-10-CM

## 2023-05-22 DIAGNOSIS — F10932 Alcohol use, unspecified with withdrawal with perceptual disturbance: Secondary | ICD-10-CM | POA: Diagnosis not present

## 2023-05-22 LAB — COMPREHENSIVE METABOLIC PANEL WITH GFR
ALT: 108 U/L — ABNORMAL HIGH (ref 0–44)
AST: 53 U/L — ABNORMAL HIGH (ref 15–41)
Albumin: 3.6 g/dL (ref 3.5–5.0)
Alkaline Phosphatase: 46 U/L (ref 38–126)
Anion gap: 8 (ref 5–15)
BUN: 18 mg/dL (ref 6–20)
CO2: 27 mmol/L (ref 22–32)
Calcium: 8.4 mg/dL — ABNORMAL LOW (ref 8.9–10.3)
Chloride: 101 mmol/L (ref 98–111)
Creatinine, Ser: 1.01 mg/dL (ref 0.61–1.24)
GFR, Estimated: >60 mL/min
Glucose, Bld: 75 mg/dL (ref 70–99)
Potassium: 3.9 mmol/L (ref 3.5–5.1)
Sodium: 136 mmol/L (ref 135–145)
Total Bilirubin: 1 mg/dL
Total Protein: 6 g/dL — ABNORMAL LOW (ref 6.5–8.1)

## 2023-05-22 LAB — CBC WITH DIFFERENTIAL/PLATELET
Abs Immature Granulocytes: 0.05 K/uL (ref 0.00–0.07)
Basophils Absolute: 0 K/uL (ref 0.0–0.1)
Basophils Relative: 1 %
Eosinophils Absolute: 0.1 K/uL (ref 0.0–0.5)
Eosinophils Relative: 1 %
HCT: 44 % (ref 39.0–52.0)
Hemoglobin: 14.2 g/dL (ref 13.0–17.0)
Immature Granulocytes: 1 %
Lymphocytes Relative: 32 %
Lymphs Abs: 1.8 K/uL (ref 0.7–4.0)
MCH: 32.6 pg (ref 26.0–34.0)
MCHC: 32.3 g/dL (ref 30.0–36.0)
MCV: 100.9 fL — ABNORMAL HIGH (ref 80.0–100.0)
Monocytes Absolute: 0.9 K/uL (ref 0.1–1.0)
Monocytes Relative: 17 %
Neutro Abs: 2.6 K/uL (ref 1.7–7.7)
Neutrophils Relative %: 48 %
Platelets: 181 K/uL (ref 150–400)
RBC: 4.36 MIL/uL (ref 4.22–5.81)
RDW: 13 % (ref 11.5–15.5)
WBC: 5.5 K/uL (ref 4.0–10.5)
nRBC: 0 % (ref 0.0–0.2)

## 2023-05-22 LAB — LACTIC ACID, PLASMA: Lactic Acid, Venous: 0.8 mmol/L (ref 0.5–1.9)

## 2023-05-22 MED ORDER — DIPHENHYDRAMINE-ZINC ACETATE 2-0.1 % EX CREA: TOPICAL_CREAM | Freq: Three times a day (TID) | CUTANEOUS | Status: DC | PRN

## 2023-05-22 MED ORDER — ESCITALOPRAM OXALATE 20 MG PO TABS
10.0000 mg | ORAL_TABLET | Freq: Every day | ORAL | Status: DC
  Administered 2023-05-23: 10 mg via ORAL
  Filled 2023-05-22: qty 1

## 2023-05-22 MED ORDER — HYDROCORTISONE 1 % EX CREA
TOPICAL_CREAM | Freq: Three times a day (TID) | CUTANEOUS | Status: DC
  Filled 2023-05-22: qty 28

## 2023-05-22 MED ORDER — PHENOBARBITAL SODIUM 130 MG/ML IJ SOLN
97.5000 mg | Freq: Once | INTRAMUSCULAR | Status: AC
  Administered 2023-05-22: 97.5 mg via INTRAVENOUS
  Filled 2023-05-22: qty 1

## 2023-05-22 MED ORDER — QUETIAPINE FUMARATE 50 MG PO TABS
25.0000 mg | ORAL_TABLET | Freq: Three times a day (TID) | ORAL | Status: DC | PRN
  Filled 2023-05-22: qty 1

## 2023-05-22 MED ORDER — QUETIAPINE FUMARATE 50 MG PO TABS: 50.0000 mg | ORAL_TABLET | Freq: Three times a day (TID) | ORAL | Status: DC | PRN

## 2023-05-22 NOTE — Plan of Care (Signed)
  Problem: Nutrition: Goal: Adequate nutrition will be maintained Outcome: Progressing   Problem: Elimination: Goal: Will not experience complications related to bowel motility Outcome: Progressing Goal: Will not experience complications related to urinary retention Outcome: Progressing   
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
## 2023-05-22 NOTE — Consult Note (Addendum)
[facility] Consult Follow-up  Patient Name: .Jermaine [name]  MRN: [id number]  DOB: 1965/09/02  Consult Order details:  Orders (From admission, onward)     Start     Ordered   05/21/23 0700  IP CONSULT TO PSYCHIATRY       Ordering Provider: Dolly Rias, MD  Provider:  (Not yet assigned)  Question Answer Comment  Location [facility]   Reason for Consult? suicidal ideation      05/21/23 0659             Mode of Visit: In person    Psychiatry Consult Evaluation  Service Date: May 22, 2023 LOS:  LOS: 1 day  Chief Complaint Alcohol Withdrawal  Primary Psychiatric Diagnoses  Alcohol Use Disorder, Severe Alcohol induced mood disorder  Assessment  Jermaine [name] is a 57 y.o. male with hx of severe alcohol use disorder c/b DT's and withdrawal seizures and suspected alcohol induced mood disorder admitted on 05/20/2023  6:58 PM for severe alcohol withdrawal and SI/HI. He had left AMA after hospitalziation 12/15-12/22 and immediately began drinking 2/5 of liquor prior to returning to hospital 05/20/23. Patient was started on phenobarb taper and has required high doses of ativan to manage patient's agitation likely secondary to alcohol withdrawal.    Psychiatry was consulted due to SI. IVC completed 05/21/23 due to concerns for SI/HI. He is currently on 1:1 sitter due to IVC. Patient continues to experience significant withdrawal (CIWA up to 20) and requiring 12 mg of ativan yesterday afternoon to manage withdrawal symptoms. He was started on mirtazapine last night and tolerated it well. PRN ativan not required last night. At this time, plan to continue IVC to monitor for safety. Patient appears to be more in contemplative phase of change at this time. Will discuss restarting naltrexone tomorrow should LFTs continue to downtrend.  Diagnoses:  Active Hospital problems: Principal Problem:   Alcohol withdrawal (HCC)    Plan   #Alcohol Use  Disorder, severe History of DT's and withdrawal seizures -last drink 05/20/23 -phenobarb taper per primary -CIWA w/ ativan (last 3: 11>4>5) -IM haloperidol 5 mg q6h prn for agitation  #Alcohol induced mood disorder -continue mirtazapine 15 mg at bedtime -decrease lexapro to 20 mg daily   ## Medical Decision Making Capacity: Not specifically addressed in this encounter  ## Further Work-up:  -- most recent EKG on 05/20/23 had QtC of 472  ASAM Dimensional Analysis and Severity rating: 0=no problem or stable / 1=mild / 2=moderate / 3=substantial / 4= severe Dimension 1 (intoxication/ withdrawal potential)- Severity rating= => 4: Incapacitated, with severe signs and symptoms of withdrawal Dimension 2 (Biomedical)- Severity rating => 4: Incapacitated, with severe medical problems Dimension 3 (emotional, behavioral, cognitive)- Severity rating => 3: Frequent impulses to harm self or others which are potentially destabilizing, but not imminently dangerous  Dimension 4 (Readiness to change)- Severity rating => 4: Unable to follow through, has little or no awareness of substance use or mental health problems and associated negative consequences Dimension 5 (Relapse/ risk for continued use)- Severity rating => 3: Little recognition and understanding of substance use relapse  Dimension 6 (Recovery environment)- Severity rating => 4: Environment is not supportive of addiction recovery and is hostile and toxic to recovery or treatment progress   ASAM corresponding LOC recommendations  - Inpatient Treatment (Level 4.0): Severe problems in Dimensions 1, 2 or 3. Problems in the other Dimensions do not qualify for this level of care  ## Disposition:-- pending. Monitoring  for progress. Strongly recommend residential rehab for disposition but patient refusing  ## Behavioral / Environmental: -Delirium Precautions: Delirium Interventions for Nursing and Staff: - RN to open blinds every AM. - To  Bedside: Glasses, hearing aide, and pt's own shoes. Make available to patients. when possible and encourage use. - Encourage po fluids when appropriate, keep fluids within reach. - OOB to chair with meals. - Passive ROM exercises to all extremities with AM & PM care. - RN to assess orientation to person, time and place QAM and PRN. - Recommend extended visitation hours with familiar family/friends as feasible. - Staff to minimize disturbances at night. Turn off television when pt asleep or when not in use.    ## Safety and Observation Level:  - Based on my clinical evaluation, I estimate the patient to be at high risk of self harm in the current setting. Plan to continue IVC. - At this time, we recommend  1:1 Observation. This decision is based on my review of the chart including patient's history and current presentation, interview of the patient, mental status examination, and consideration of suicide risk including evaluating suicidal ideation, plan, intent, suicidal or self-harm behaviors, risk factors, and protective factors. This judgment is based on our ability to directly address suicide risk, implement suicide prevention strategies, and develop a safety plan while the patient is in the clinical setting. Please contact our team if there is a concern that risk level has changed.  CSSR Risk Category:C-SSRS RISK CATEGORY: Moderate Risk  Suicide Risk Assessment: Patient has following modifiable risk factors for suicide: access to guns, under treated depression , social isolation, recklessness, and medication noncompliance, which we are addressing by advised to have guns removed, encourage talking with family. Patient has following non-modifiable or demographic risk factors for suicide: male gender and psychiatric hospitalization Patient has the following protective factors against suicide: Supportive family, no history of suicide attempts, and no history of NSSIB  Thank you for this consult  request. Recommendations have been communicated to the primary team.  We will continue to follow at this time.   Park Pope, MD       History of Present Illness  Relevant Aspects of [facility] [place] [facility] Course:  Admitted on 05/20/2023 for SI/HI and severe alcohol withdrawal. They were admitted to ICU with phenobarbital taper and requiring multiple doses of PRN ativan.   Patient Report:  Patient today reports doing ok. Continues to experience severe withdrawal symptoms. He tolerated the mirtazapine w/o major side effects. Denies SI/HI/AVH. He is insistent he would never harm his sister or mother. Appetite and sleep fair. Amenable to continuing treatment as planned. He is perseverative about finding his missing truck. He states in an effort to find it after leaving AMA, he walked around the bars he drank alcohol and ended up drinking there.   Collateral Caesar Chestnut (sister) @ [phone] on 05/21/23 Patient has struggled with severe alcohol use for past 6 months.  During Thanksgiving, he had visited sister and mom in [place] and had reportedly been sober and appropriately behaving but then told friend after return he had "hid his drinking well". He has attended multiple substance use rehab programs and they do not appear to have been effective.  Patient does not report SI/HI when he is sober.  After leaving AMA last hospitalization, he immediately restarted drinking alcohol.  2 days ago, he called mom and stated that he "needed $15,000 to travel the world" and that "it was his money anyways".  Mother currently  resides with sister in [place] due to medical problems. This was denied to him but sister was agreeable to paying his rent and bills. Yesterday, patient spoke with sister and mom and told mom to "shut up" and told sister "when I go down there, I'm going to kick your ass and put you in a chokehold". He also stated "all I've got is a 9 mm now".  He was finally agreeable to going back to the hospital to  undergo medical detox for his alcohol use but due to concern for safety, sister called EMS for patient due to his level of belligerence and agitation over the phone.   Sister states that patient reportedly had set up a noose and tied it to a chair as a suicidal gesture and after completing detox at the hospital, he was transferred to [facility] in [place] for a week.  Sister reports that patient's experience there was horrific and reports that it was not helpful at all citing patient feeling fearful of his own life at the psychiatric hospital. Sister does not want patient to go to psychiatric hospital if at all possible.   I discussed that I would follow up with patient after he is more alert but set  expectations that residential rehab and most substance use treatment facilities are voluntary and patient has to be willing to quit alcohol. Sister reports understanding. All questions addressed.   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 Living Situation: lives alone Access to weapons/lethal means: has a 9 mm locked away at home  Substance History Alcohol: severe binge dirnking  Type of alcohol: liquor and beer Last Drink 05/20/23 Number of drinks per day: >10 History of alcohol withdrawal seizures: DT's and withdrawal seizures History of DT's: endorses Illicit drugs: denies Rehab hx: [facility]  Exam Findings   Vital Signs:  Temp:  [98.2 F (36.8 C)-99.1 F (37.3 C)] 99 F (37.2 C) (12/25 0800) Pulse Rate:   [62-115] 87 (12/25 0800) Resp:  [14-22] 22 (12/25 0800) BP: (94-153)/(63-106) 141/95 (12/25 0730) SpO2:  [92 %-100 %] 98 % (12/25 0800) Weight:  [89.6 kg] 89.6 kg (12/25 0633) Blood pressure (!) 141/95, pulse 87, temperature 99 F (37.2 C), temperature source Oral, resp. rate (!) 22, height 5\' 10"  (1.778 m), weight 89.6 kg, SpO2 98%. Body mass index is 28.34 kg/m.  Physical Exam  Mental Status Exam: General Appearance: Disheveled  Orientation:  Full (Time, Place, and Person)  Memory:  Remote;   Fair  Concentration:  Concentration: Fair  Recall:  Fair  Attention  Fair  Eye Contact:  Fair  Speech:  Slow and Slurred  Language:  Fair  Volume:  Normal  Mood: "ok"  Affect:  Appropriate and Congruent  Thought Process:  Coherent and Goal Directed  Thought Content:  Logical  Suicidal Thoughts:  No  Homicidal Thoughts:  No  Judgement:  Impaired  Insight:  Shallow  Psychomotor Activity:  Decreased  Akathisia:  No  Fund of Knowledge:  Fair      Assets:  Manufacturing systems engineer Leisure Time Social Support  Cognition:  WNL  ADL's:  Intact  AIMS (if indicated):        Other History   These have been pulled in through the EMR, reviewed, and updated if appropriate.  Family History:  The patient's family history is not on file. He was adopted.  Medical History: Past Medical History:  Diagnosis Date   Alcohol abuse    Anxiety    Class 1 obesity 09/14/2021   Depression     Surgical History: Past Surgical History:  Procedure Laterality Date   HERNIA REPAIR       Medications:   Current Facility-Administered Medications:    acetaminophen (TYLENOL) tablet 1,000 mg, 1,000 mg, Oral, Q6H PRN, Dolly Rias, MD   azelastine (ASTELIN) 0.1 % nasal spray 1 spray, 1 spray, Each Nare, BID, Luiz Iron, NP, 1 spray at 05/21/23 2158   cephALEXin (KEFLEX) capsule 500 mg, 500 mg, Oral, Q8H, Segars, Christiane Ha, MD, 500 mg at 05/22/23 9528   Chlorhexidine Gluconate Cloth 2 % PADS 6  each, 6 each, Topical, Daily, Dolly Rias, MD, 6 each at 05/21/23 0844   enoxaparin (LOVENOX) injection 40 mg, 40 mg, Subcutaneous, Q24H, Segars, Christiane Ha, MD   escitalopram (LEXAPRO) tablet 20 mg, 20 mg, Oral, Daily, Segars, Christiane Ha, MD   folic acid (FOLVITE) tablet 1 mg, 1 mg, Oral, Daily, Segars, Christiane Ha, MD   haloperidol lactate (HALDOL) injection 5 mg, 5 mg, Intramuscular, Q6H PRN, Park Pope, MD   hydrOXYzine (ATARAX) tablet 25 mg, 25 mg, Oral, TID PRN, Dolly Rias, MD   LORazepam (ATIVAN) tablet 1-4 mg, 1-4 mg, Oral, Q1H PRN, 3 mg at 05/21/23 1616 **OR** LORazepam (ATIVAN) injection 1-4 mg, 1-4 mg, Intravenous, Q1H PRN, Dolly Rias, MD, 2 mg at 05/21/23 1837   melatonin tablet 9 mg, 9 mg, Oral, QHS, Segars, Christiane Ha, MD, 9 mg at 05/21/23 2107   mirtazapine (REMERON) tablet 15 mg, 15 mg, Oral, QHS, Park Pope, MD, 15 mg at 05/21/23 2107   multivitamin with minerals tablet 1 tablet, 1 tablet, Oral, Daily, Segars, Christiane Ha, MD   ondansetron (ZOFRAN) tablet 4 mg, 4 mg, Oral, Q6H PRN **OR** ondansetron (ZOFRAN) injection 4 mg, 4 mg, Intravenous, Q6H PRN, Dolly Rias, MD   Oral care mouth rinse, 15 mL, Mouth Rinse, PRN, Segars, Christiane Ha, MD   PHENObarbital (LUMINAL) injection 97.5 mg, 97.5 mg, Intravenous, Q8H, 97.5 mg at 05/21/23 2106 **FOLLOWED BY** [START ON 05/23/2023] PHENObarbital (LUMINAL) injection 65 mg, 65 mg, Intravenous, Q8H **FOLLOWED BY** [START ON 05/25/2023] PHENObarbital (LUMINAL) injection 32.5 mg, 32.5 mg, Intravenous, Q8H, Segars, Jonathan, MD   polyethylene glycol (MIRALAX / GLYCOLAX) packet 17 g, 17 g, Oral, Daily PRN, Segars, Christiane Ha, MD   sodium chloride flush (NS) 0.9 % injection 3 mL, 3 mL, Intravenous, Q12H, Segars, Jonathan, MD, 3 mL at 05/21/23 2110   thiamine (VITAMIN B1) tablet 100 mg, 100 mg, Oral, Daily, Segars, Christiane Ha, MD   vancomycin (VANCOCIN) capsule 125 mg, 125 mg, Oral, QID, Dolly Rias, MD, 125 mg at 05/21/23  2106  Allergies: Allergies  Allergen Reactions   Ketamine     Hx of causing fever in 2024     Park Pope, MD

## 2023-05-22 NOTE — Plan of Care (Signed)
  Problem: Education: Goal: Knowledge of General Education information will improve Description: Including pain rating scale, medication(s)/side effects and non-pharmacologic comfort measures Outcome: Progressing   Problem: Health Behavior/Discharge Planning: Goal: Ability to manage health-related needs will improve Outcome: Progressing   Problem: Clinical Measurements: Goal: Ability to maintain clinical measurements within normal limits will improve Outcome: Progressing Goal: Will remain free from infection Outcome: Progressing Goal: Diagnostic test results will improve Outcome: Progressing Goal: Respiratory complications will improve Outcome: Progressing Goal: Cardiovascular complication will be avoided Outcome: Progressing   Problem: Activity: Goal: Risk for activity intolerance will decrease Outcome: Progressing   Problem: Nutrition: Goal: Adequate nutrition will be maintained Outcome: Progressing   Problem: Coping: Goal: Level of anxiety will decrease Outcome: Progressing   Problem: Elimination: Goal: Will not experience complications related to bowel motility Outcome: Progressing Goal: Will not experience complications related to urinary retention Outcome: Progressing   Problem: Pain Management: Goal: General experience of comfort will improve Outcome: Progressing   Problem: Safety: Goal: Ability to remain free from injury will improve Outcome: Progressing   Problem: Skin Integrity: Goal: Risk for impaired skin integrity will decrease Outcome: Progressing   Problem: Education: Goal: Knowledge of disease or condition will improve Outcome: Progressing Goal: Understanding of discharge needs will improve Outcome: Progressing   Problem: Health Behavior/Discharge Planning: Goal: Ability to identify changes in lifestyle to reduce recurrence of condition will improve Outcome: Progressing Goal: Identification of resources available to assist in meeting health  care needs will improve Outcome: Progressing   Problem: Safety: Goal: Ability to remain free from injury will improve Outcome: Progressing

## 2023-05-23 ENCOUNTER — Encounter (HOSPITAL_COMMUNITY): Payer: Self-pay | Admitting: Internal Medicine

## 2023-05-23 DIAGNOSIS — F10932 Alcohol use, unspecified with withdrawal with perceptual disturbance: Secondary | ICD-10-CM | POA: Diagnosis not present

## 2023-05-23 DIAGNOSIS — F10930 Alcohol use, unspecified with withdrawal, uncomplicated: Secondary | ICD-10-CM | POA: Diagnosis not present

## 2023-05-23 DIAGNOSIS — F1094 Alcohol use, unspecified with alcohol-induced mood disorder: Secondary | ICD-10-CM | POA: Diagnosis not present

## 2023-05-23 MED ORDER — PHENOBARBITAL 32.4 MG PO TABS
64.8000 mg | ORAL_TABLET | Freq: Three times a day (TID) | ORAL | Status: DC
  Administered 2023-05-23 – 2023-05-24 (×3): 64.8 mg via ORAL
  Filled 2023-05-23 (×3): qty 2

## 2023-05-23 MED ORDER — ESCITALOPRAM OXALATE 10 MG PO TABS
10.0000 mg | ORAL_TABLET | Freq: Every day | ORAL | Status: AC
  Administered 2023-05-24: 10 mg via ORAL
  Filled 2023-05-23: qty 1

## 2023-05-23 MED ORDER — PHENOBARBITAL 32.4 MG PO TABS: 32.4000 mg | ORAL_TABLET | Freq: Three times a day (TID) | ORAL | Status: DC

## 2023-05-23 MED ORDER — ACAMPROSATE CALCIUM 333 MG PO TBEC
666.0000 mg | DELAYED_RELEASE_TABLET | Freq: Three times a day (TID) | ORAL | Status: DC
  Administered 2023-05-23 – 2023-05-24 (×3): 666 mg via ORAL
  Filled 2023-05-23 (×4): qty 2

## 2023-05-23 NOTE — TOC Progression Note (Signed)
Transition of Care [facility]) - Progression Note   Patient Details  Name: Jermaine [name] MRN: [id number] Date of Birth: 04/06/1966  [facility] of [facility]) CM/SW Contact  Ewing Schlein, LCSW Phone Number: 05/23/2023, 12:13 PM  Clinical Narrative: ETOH use resources added to AVS.  Barriers to Discharge: Continued Medical Work up  Social Determinants of Health (SDOH) Interventions SDOH Screenings   Food Insecurity: No Food Insecurity (05/21/2023)  Housing: Low Risk  (05/21/2023)  Transportation Needs: No Transportation Needs (05/21/2023)  Utilities: Not At Risk (05/21/2023)  Recent Concern: Utilities - At Risk (05/02/2023)  Tobacco Use: Low Risk  (05/12/2023)   Readmission Risk Interventions    05/13/2023    2:46 PM 01/17/2023    4:03 PM 01/14/2023    1:49 PM  Readmission Risk Prevention Plan  Post Dischage Appt   Complete  Medication Screening   Complete  Transportation Screening Complete Complete Complete  PCP or Specialist Appt within 5-7 Days  Complete   PCP or Specialist Appt within 3-5 Days Complete    Home Care Screening  Complete   Medication Review (RN CM)  Complete   HRI or Home Care Consult Complete    Social Work Consult for Recovery Care Planning/Counseling Complete    Palliative Care Screening Not Applicable    Medication Review Oceanographer) Complete
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
## 2023-05-23 NOTE — Consult Note (Signed)
[facility] Consult Follow-up  Patient Name: .Jermaine [name]  MRN: [id number]  DOB: Aug 07, 1965  Consult Order details:  Orders (From admission, onward)     Start     Ordered   05/21/23 0700  IP CONSULT TO PSYCHIATRY       Ordering Provider: Dolly Rias, MD  Provider:  (Not yet assigned)  Question Answer Comment  Location [facility]   Reason for Consult? suicidal ideation      05/21/23 0659             Mode of Visit: In person    Psychiatry Consult Evaluation  Service Date: May 23, 2023 LOS:  LOS: 2 days  Chief Complaint Alcohol Withdrawal  Primary Psychiatric Diagnoses  Alcohol Use Disorder, Severe Alcohol induced mood disorder  Assessment  Jermaine [name] is a 57 y.o. male with hx of severe alcohol use disorder c/b DT's and withdrawal seizures and suspected alcohol induced mood disorder admitted on 05/20/2023  6:58 PM for severe alcohol withdrawal and SI/HI. He had left AMA after hospitalziation 12/15-12/22 and immediately began drinking 2/5 of liquor prior to returning to hospital 05/20/23. Patient was started on phenobarb taper and has required high doses of ativan to manage patient's agitation likely secondary to alcohol withdrawal.    Psychiatry was consulted due to SI. IVC completed 05/21/23 due to concerns for SI/HI. He is currently on 1:1 sitter due to IVC and alcohol withdrawal. Remains on phenobarbital taper. Continues to require PRN ativan to manage alcohol withdrawal. Continues to deny SI/HI. Crosstapering from lexapro to mirtazapine.   Diagnoses:  Active Hospital problems: Principal Problem:   Alcohol withdrawal (HCC) Active Problems:   Alcohol-induced mood disorder (HCC)    Plan   #Alcohol Use Disorder, severe History of DT's and withdrawal seizures -last drink 05/20/23 -phenobarb taper per primary -CIWA w/ ativan (last 3: 3>0>4) -IM haloperidol 5 mg q6h prn for agitation -hydroxyzine 25 mg tid prn for  anxiety  #Alcohol induced mood disorder -continue mirtazapine 15 mg at bedtime  -can consider increasing mirtazapine tomorrow. -decrease lexapro to 10 mg daily x2 days then STOP   ## Medical Decision Making Capacity: Not specifically addressed in this encounter  ## Further Work-up:  -- most recent EKG on 05/20/23 had QtC of 472  ASAM Dimensional Analysis and Severity rating: 0=no problem or stable / 1=mild / 2=moderate / 3=substantial / 4= severe Dimension 1 (intoxication/ withdrawal potential)- Severity rating= => 4: Incapacitated, with severe signs and symptoms of withdrawal Dimension 2 (Biomedical)- Severity rating => 4: Incapacitated, with severe medical problems Dimension 3 (emotional, behavioral, cognitive)- Severity rating => 3: Frequent impulses to harm self or others which are potentially destabilizing, but not imminently dangerous  Dimension 4 (Readiness to change)- Severity rating => 4: Unable to follow through, has little or no awareness of substance use or mental health problems and associated negative consequences Dimension 5 (Relapse/ risk for continued use)- Severity rating => 3: Little recognition and understanding of substance use relapse  Dimension 6 (Recovery environment)- Severity rating => 4: Environment is not supportive of addiction recovery and is hostile and toxic to recovery or treatment progress   ASAM corresponding LOC recommendations  - Inpatient Treatment (Level 4.0): Severe problems in Dimensions 1, 2 or 3. Problems in the other Dimensions do not qualify for this level of care  ## Disposition:-- pending. Monitoring for progress. Strongly recommend residential rehab for disposition but patient refusing  ## Behavioral / Environmental: -Delirium Precautions: Delirium Interventions for Nursing  and Staff: - RN to open blinds every AM. - To Bedside: Glasses, hearing aide, and pt's own shoes. Make available to patients. when possible and encourage use. -  Encourage po fluids when appropriate, keep fluids within reach. - OOB to chair with meals. - Passive ROM exercises to all extremities with AM & PM care. - RN to assess orientation to person, time and place QAM and PRN. - Recommend extended visitation hours with familiar family/friends as feasible. - Staff to minimize disturbances at night. Turn off television when pt asleep or when not in use.    ## Safety and Observation Level:  - Based on my clinical evaluation, I estimate the patient to be at high risk of self harm in the current setting. Plan to continue IVC. - At this time, we recommend  1:1 Observation. This decision is based on my review of the chart including patient's history and current presentation, interview of the patient, mental status examination, and consideration of suicide risk including evaluating suicidal ideation, plan, intent, suicidal or self-harm behaviors, risk factors, and protective factors. This judgment is based on our ability to directly address suicide risk, implement suicide prevention strategies, and develop a safety plan while the patient is in the clinical setting. Please contact our team if there is a concern that risk level has changed.  CSSR Risk Category:C-SSRS RISK CATEGORY: Moderate Risk  Suicide Risk Assessment: Patient has following modifiable risk factors for suicide: access to guns, under treated depression , social isolation, recklessness, and medication noncompliance, which we are addressing by advised to have guns removed, encourage talking with family. Patient has following non-modifiable or demographic risk factors for suicide: male gender and psychiatric hospitalization Patient has the following protective factors against suicide: Supportive family, no history of suicide attempts, and no history of NSSIB  Thank you for this consult request. Recommendations have been communicated to the primary team.  We will continue to follow at this time.   Park Pope, MD       History of Present Illness  Relevant Aspects of [facility] Course:  Admitted on 05/20/2023 for SI/HI and severe alcohol withdrawal. They were admitted to ICU with phenobarbital taper and requiring multiple doses of PRN ativan.   Patient Report:  Patient today reports doing good.  He denies any acute withdrawal symptoms.  He feels that he is ready to discharge today but we reiterated the importance of him completing his phenobarbital taper so that he does not have to immediately return to the hospital due to severe alcohol withdrawal symptoms.  He verbalized understanding.  We discussed MAT for alcohol use disorder and patient was amenable to starting one if it would be beneficial for him. He denies SI/HI/AVH.  He reports his sleep and appetite have been improving.  He continues to be amenable to switching from Lexapro to mirtazapine.  He denies any acute alcohol cravings at this time.  Collateral Caesar Chestnut (sister) @ [phone] on 05/21/23 Patient has struggled with severe alcohol use for past 6 months.  During Thanksgiving, he had visited sister and mom in [place] and had reportedly been sober and appropriately behaving but then told friend after return he had "hid his drinking well". He has attended multiple substance use rehab programs and they do not appear to have been effective.  Patient does not report SI/HI when he is sober.  After leaving AMA last hospitalization, he immediately restarted drinking alcohol.  2 days ago, he called mom and stated that he "needed $  15,000 to travel the world" and that "it was his money anyways".  Mother currently resides with sister in [place] due to medical problems. This was denied to him but sister was agreeable to paying his rent and bills. Yesterday, patient spoke with sister and mom and told mom to "shut up" and told sister "when I go down there, I'm going to kick your ass and put you in a chokehold". He also stated "all I've got is a 9 mm  now".  He was finally agreeable to going back to the hospital to undergo medical detox for his alcohol use but due to concern for safety, sister called EMS for patient due to his level of belligerence and agitation over the phone.   Sister states that patient reportedly had set up a noose and tied it to a chair as a suicidal gesture and after completing detox at the hospital, he was transferred to [facility] in [place] for a week.  Sister reports that patient's experience there was horrific and reports that it was not helpful at all citing patient feeling fearful of his own life at the psychiatric hospital. Sister does not want patient to go to psychiatric hospital if at all possible.   I discussed that I would follow up with patient after he is more alert but set  expectations that residential rehab and most substance use treatment facilities are voluntary and patient has to be willing to quit alcohol. Sister reports understanding. All questions addressed.   Updated collateral 05/23/23 Patient appears to be doing much better with regards to his mood as well as feeling deeply apologetic regarding the threats he had made which led to his involuntary commitment.  She felt that the apology was sincere and does not feel that he is at risk of self-harm or harm to others.  She does feel that the alcohol has resulted in significant mood disturbances and is the primary etiology to any type of aggression he may display.  Sister does not feel that residential rehab will be beneficial for patient upon discharge.  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 Living Situation: lives alone Access to weapons/lethal means: has a 9 mm locked away at home  Substance History Alcohol: severe binge dirnking  Type of alcohol: liquor and beer Last Drink 05/20/23 Number of drinks per day: >10 History of alcohol withdrawal seizures: DT's and withdrawal seizures History of DT's: endorses Illicit drugs: denies Rehab hx: multiple [facility]3  Exam Findings   Vital Signs:  Temp:  [97.9 F (36.6 C)-98.7 F (37.1 C)] 98 F (36.7 C) (12/26 0800) Pulse Rate:  [55-106] 62 (12/26 0800) Resp:  [12-36] 18 (12/26 0800) BP: (106-155)/(66-90) 106/66 (12/26 0800) SpO2:  [90 %-96 %] 91 % (12/26 0800) Blood pressure 106/66, pulse 62, temperature 98 F (36.7 C), temperature source Oral, resp. rate 18, height 5\' 10"  (1.778 m), weight 89.6 kg, SpO2 91%. Body mass index is 28.34 kg/m.  Physical Exam  Mental Status Exam: General Appearance: Disheveled  Orientation:  Full (Time, Place, and Person)  Memory:  Remote;   Fair  Concentration:  Concentration: Fair  Recall:  Fair  Attention  Fair  Eye Contact:  Fair  Speech:  Slow and Slurred  Language:  Fair  Volume:  Normal  Mood: "ok"  Affect:  Appropriate and Congruent  Thought Process:  Coherent and Goal Directed  Thought Content:  Logical  Suicidal Thoughts:  No  Homicidal Thoughts:  No  Judgement:  Impaired  Insight:  Shallow  Psychomotor Activity:  Decreased  Akathisia:  No  Fund of Knowledge:  Fair      Assets:  Manufacturing systems engineer Leisure Time Social Support  Cognition:  WNL  ADL's:  Intact  AIMS (if indicated):        Other History   These have been pulled in through the EMR, reviewed, and updated if appropriate.  Family History:  The patient's family history is not on file. He was adopted.  Medical History: Past Medical History:  Diagnosis Date   Alcohol abuse     Anxiety    Class 1 obesity 09/14/2021   Depression     Surgical History: Past Surgical History:  Procedure Laterality Date   HERNIA REPAIR       Medications:   Current Facility-Administered Medications:    acetaminophen (TYLENOL) tablet 1,000 mg, 1,000 mg, Oral, Q6H PRN, Dolly Rias, MD, 1,000 mg at 05/23/23 0618   cephALEXin (KEFLEX) capsule 500 mg, 500 mg, Oral, Q8H, Segars, Christiane Ha, MD, 500 mg at 05/23/23 8657   Chlorhexidine Gluconate Cloth 2 % PADS 6 each, 6 each, Topical, Daily, Dolly Rias, MD, 6 each at 05/22/23 2009   diphenhydrAMINE-zinc acetate (BENADRYL) 2-0.1 % cream, , Topical, TID PRN, Hughie Closs, MD   enoxaparin (LOVENOX) injection 40 mg, 40 mg, Subcutaneous, Q24H, Segars, Christiane Ha, MD, 40 mg at 05/23/23 0907   escitalopram (LEXAPRO) tablet 10 mg, 10 mg, Oral, Daily, Zouev, Dmitri, MD, 10 mg at 05/23/23 8469   folic acid (FOLVITE) tablet 1 mg, 1 mg, Oral, Daily, Segars, Christiane Ha, MD, 1 mg at 05/23/23 6295   haloperidol lactate (HALDOL) injection 5 mg, 5 mg, Intramuscular, Q6H PRN, Park Pope, MD   hydrocortisone cream 1 %, , Topical, TID, Hughie Closs, MD, Given at 05/23/23 0908   hydrOXYzine (ATARAX) tablet 25 mg, 25 mg, Oral, TID PRN, Dolly Rias, MD, 25 mg at 05/22/23 2054   LORazepam (ATIVAN) tablet 1-4 mg, 1-4 mg, Oral, Q1H PRN, 1 mg at 05/23/23 0618 **OR** LORazepam (ATIVAN) injection 1-4 mg, 1-4 mg, Intravenous, Q1H PRN, Dolly Rias, MD, 2 mg at 05/21/23 1837   melatonin tablet 9 mg, 9 mg, Oral, QHS, Segars, Christiane Ha, MD, 9 mg at 05/22/23 2053   mirtazapine (REMERON) tablet 15 mg, 15 mg, Oral, QHS, Park Pope, MD, 15 mg at 05/22/23 2054   multivitamin with minerals tablet 1 tablet, 1 tablet, Oral, Daily, Segars, Christiane Ha, MD, 1 tablet at 05/23/23 0906   ondansetron (ZOFRAN) tablet 4 mg, 4 mg, Oral, Q6H PRN **OR** ondansetron (ZOFRAN) injection 4 mg, 4 mg, Intravenous, Q6H PRN, Dolly Rias, MD   Oral care mouth rinse, 15 mL, Mouth  Rinse, PRN, Segars, Christiane Ha, MD   [EXPIRED] PHENObarbital (LUMINAL) injection 97.5 mg, 97.5 mg, Intravenous, Q8H, 97.5 mg at 05/22/23 2045 **FOLLOWED BY** PHENObarbital (LUMINAL) injection 65 mg, 65 mg, Intravenous, Q8H, 65 mg at 05/23/23 0600 **FOLLOWED BY** [START ON 05/25/2023] PHENObarbital (LUMINAL) injection 32.5 mg, 32.5 mg, Intravenous, Q8H, Segars, Jonathan, MD   polyethylene glycol (MIRALAX / GLYCOLAX) packet 17 g, 17 g, Oral, Daily PRN, Dolly Rias, MD   QUEtiapine (SEROQUEL)  tablet 25 mg, 25 mg, Oral, Q8H PRN, Park Pope, MD   sodium chloride flush (NS) 0.9 % injection 3 mL, 3 mL, Intravenous, Q12H, Segars, Jonathan, MD, 3 mL at 05/23/23 2956   thiamine (VITAMIN B1) tablet 100 mg, 100 mg, Oral, Daily, Segars, Christiane Ha, MD, 100 mg at 05/23/23 0905   vancomycin (VANCOCIN) capsule 125 mg, 125 mg, Oral, QID, Dolly Rias, MD, 125 mg at 05/23/23 2130  Allergies: Allergies  Allergen Reactions   Ketamine     Hx of causing fever in 2024     Park Pope, MD

## 2023-05-23 NOTE — Plan of Care (Signed)
Patient able to verbalize why he is in the hospital but continues to need reinforcement regarding length of stay needed.

## 2023-05-24 ENCOUNTER — Other Ambulatory Visit: Payer: Self-pay

## 2023-05-24 ENCOUNTER — Other Ambulatory Visit (HOSPITAL_COMMUNITY): Payer: Self-pay

## 2023-05-24 DIAGNOSIS — F1094 Alcohol use, unspecified with alcohol-induced mood disorder: Secondary | ICD-10-CM | POA: Diagnosis not present

## 2023-05-24 DIAGNOSIS — F10930 Alcohol use, unspecified with withdrawal, uncomplicated: Secondary | ICD-10-CM | POA: Diagnosis not present

## 2023-05-24 DIAGNOSIS — F10932 Alcohol use, unspecified with withdrawal with perceptual disturbance: Secondary | ICD-10-CM | POA: Diagnosis not present

## 2023-05-24 DIAGNOSIS — A0472 Enterocolitis due to Clostridium difficile, not specified as recurrent: Secondary | ICD-10-CM | POA: Diagnosis present

## 2023-05-24 LAB — COMPREHENSIVE METABOLIC PANEL
ALT: 134 U/L — ABNORMAL HIGH (ref 0–44)
AST: 67 U/L — ABNORMAL HIGH (ref 15–41)
Albumin: 3.6 g/dL (ref 3.5–5.0)
Alkaline Phosphatase: 47 U/L (ref 38–126)
Anion gap: 12 (ref 5–15)
BUN: 10 mg/dL (ref 6–20)
CO2: 25 mmol/L (ref 22–32)
Calcium: 9.1 mg/dL (ref 8.9–10.3)
Chloride: 99 mmol/L (ref 98–111)
Creatinine, Ser: 0.86 mg/dL (ref 0.61–1.24)
GFR, Estimated: >60 mL/min (ref >60)
Glucose, Bld: 69 mg/dL — ABNORMAL LOW (ref 70–99)
Potassium: 3.7 mmol/L (ref 3.5–5.1)
Sodium: 136 mmol/L (ref 135–145)
Total Bilirubin: 0.7 mg/dL (ref <1.2)
Total Protein: 6.2 g/dL — ABNORMAL LOW (ref 6.5–8.1)

## 2023-05-24 LAB — CBC WITH DIFFERENTIAL/PLATELET
Abs Immature Granulocytes: 0.07 10*3/uL (ref 0.00–0.07)
Basophils Absolute: 0.1 10*3/uL (ref 0.0–0.1)
Basophils Relative: 1 %
Eosinophils Absolute: 0.1 10*3/uL (ref 0.0–0.5)
Eosinophils Relative: 2 %
HCT: 47.6 % (ref 39.0–52.0)
Hemoglobin: 15.7 g/dL (ref 13.0–17.0)
Immature Granulocytes: 1 %
Lymphocytes Relative: 45 %
Lymphs Abs: 2.3 10*3/uL (ref 0.7–4.0)
MCH: 33.1 pg (ref 26.0–34.0)
MCHC: 33 g/dL (ref 30.0–36.0)
MCV: 100.2 fL — ABNORMAL HIGH (ref 80.0–100.0)
Monocytes Absolute: 0.7 10*3/uL (ref 0.1–1.0)
Monocytes Relative: 14 %
Neutro Abs: 1.9 10*3/uL (ref 1.7–7.7)
Neutrophils Relative %: 37 %
Platelets: 241 10*3/uL (ref 150–400)
RBC: 4.75 MIL/uL (ref 4.22–5.81)
RDW: 13.2 % (ref 11.5–15.5)
WBC: 5 10*3/uL (ref 4.0–10.5)
nRBC: 0 % (ref 0.0–0.2)

## 2023-05-24 LAB — MAGNESIUM: Magnesium: 2.4 mg/dL (ref 1.7–2.4)

## 2023-05-24 MED ORDER — ESCITALOPRAM OXALATE 10 MG PO TABS: 10.0000 mg | ORAL_TABLET | Freq: Every day | ORAL | Status: DC

## 2023-05-24 MED ORDER — ACAMPROSATE CALCIUM 333 MG PO TBEC
666.0000 mg | DELAYED_RELEASE_TABLET | Freq: Three times a day (TID) | ORAL | 0 refills | Status: AC
  Filled 2023-05-24: qty 180, 30d supply, fill #0

## 2023-05-24 MED ORDER — MIRTAZAPINE 15 MG PO TABS
15.0000 mg | ORAL_TABLET | Freq: Every day | ORAL | 0 refills | Status: DC
  Filled 2023-05-24: qty 30, 30d supply, fill #0

## 2023-05-24 NOTE — Plan of Care (Signed)
  Problem: Education: Goal: Knowledge of General Education information will improve Description: Including pain rating scale, medication(s)/side effects and non-pharmacologic comfort measures Outcome: Progressing   Problem: Health Behavior/Discharge Planning: Goal: Ability to manage health-related needs will improve Outcome: Progressing   Problem: Clinical Measurements: Goal: Ability to maintain clinical measurements within normal limits will improve Outcome: Progressing Goal: Diagnostic test results will improve Outcome: Progressing   Problem: Nutrition: Goal: Adequate nutrition will be maintained Outcome: Progressing   Problem: Coping: Goal: Level of anxiety will decrease Outcome: Progressing   Problem: Education: Goal: Knowledge of disease or condition will improve Outcome: Progressing Goal: Understanding of discharge needs will improve Outcome: Progressing   Problem: Health Behavior/Discharge Planning: Goal: Ability to identify changes in lifestyle to reduce recurrence of condition will improve Outcome: Progressing

## 2023-05-24 NOTE — Discharge Summary (Signed)
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   MRSA by PCR Next Gen NOT DETECTED NOT DETECTED Final    Comment: (NOTE) The GeneXpert MRSA Assay (FDA approved for NASAL specimens only), is one component of a comprehensive MRSA colonization surveillance  program. It is not intended to diagnose MRSA infection nor to guide or monitor treatment for MRSA infections. Test performance is not FDA approved in patients less than 23 years old. Performed at [facility], [address], [place], [place] [address]   MRSA Next Gen by PCR, Nasal     Status: None   Collection Time: 05/21/23  5:46 AM   Specimen: Nasal Mucosa; Nasal Swab  Result Value Ref Range Status    MRSA by PCR Next Gen NOT DETECTED NOT DETECTED Final    Comment: (NOTE) The GeneXpert MRSA Assay (FDA approved for NASAL specimens only), is one component of a comprehensive MRSA colonization surveillance program. It is not intended to diagnose MRSA infection nor to guide or monitor treatment for MRSA infections. Test performance is not FDA approved in patients less than 50 years old. Performed at [facility], [address] [place], [place] [address]     FURTHER DISCHARGE INSTRUCTIONS:   Get Medicines reviewed and adjusted: Please take all your medications with you for your next visit with your Primary MD   Laboratory/radiological data: Please request your Primary MD to go over all hospital tests and procedure/radiological results at the follow up, please ask your Primary MD to get all Hospital records sent to his/her office.   In some cases, they will be blood work, cultures and biopsy results pending at the time of your discharge. Please request that your primary care M.D. goes through all the records of your hospital data and follows up on these results.   Also Note the following: If you experience worsening of your admission symptoms, develop shortness of breath, life threatening emergency, suicidal or homicidal thoughts you must seek medical attention immediately by calling 911 or calling your MD immediately  if symptoms less severe.   You must read complete instructions/literature along with all the possible adverse reactions/side effects for all the Medicines you take and that have been prescribed to you. Take any new Medicines after you have completely understood and accpet all the possible adverse reactions/side effects.    Do not drive when taking Pain medications or sleeping medications (Benzodaizepines)   Do not take more than prescribed Pain, Sleep and Anxiety Medications. It is not advisable to combine anxiety,sleep and pain medications without talking  with your primary care practitioner   Special Instructions: If you have smoked or chewed Tobacco  in the last 2 yrs please stop smoking, stop any regular Alcohol  and or any Recreational drug use.   Wear Seat belts while driving.   Please note: You were cared for by a hospitalist during your hospital stay. Once you are discharged, your primary care physician will handle any further medical issues. Please note that NO REFILLS for any discharge medications will be authorized once you are discharged, as it is imperative that you return to your primary care physician (or establish a relationship with a primary care physician if you do not have one) for your post hospital discharge needs so that they can reassess your need for medications and monitor your lab values  Time coordinating discharge: Over 30 minutes  SIGNED:   Hughie Closs, MD  [facility] 05/24/2023, 1:13 PM *Please note that this is a verbal dictation therefore any spelling or grammatical errors are due to the "Dragon Medical One" system interpretation. If 7PM-7AM, please contact night-coverage [email or web address]

## 2023-05-24 NOTE — TOC Transition Note (Signed)
Transition of Care [facility]) - Discharge Note   Patient Details  Name: Jermaine [name] MRN: [id number] Date of Birth: 06/18/1965  Transition of [facility]) CM/SW Contact:  Darleene Cleaver, LCSW Phone Number: 05/24/2023, 1:50 PM   Clinical Narrative:     IVC has been rescinded.  Notice of commitment change emailed to the [address] of [place] [facility].  Envelope number [phone].  Substance abuse resources have been added to patient's AVS.  TOC signing off, please reconsult if other TOC needs arise.    Barriers to Discharge: Continued Medical Work up   Patient Goals and CMS Choice            Discharge Placement                       Discharge Plan and Services Additional resources added to the After Visit Summary for                                       Social Drivers of Health (SDOH) Interventions SDOH Screenings   Food Insecurity: No Food Insecurity (05/21/2023)  Housing: Low Risk  (05/21/2023)  Transportation Needs: No Transportation Needs (05/21/2023)  Utilities: Not At Risk (05/21/2023)  Recent Concern: Utilities - At Risk (05/02/2023)  Tobacco Use: Low Risk  (05/23/2023)     Readmission Risk Interventions    05/13/2023    2:46 PM 01/17/2023    4:03 PM 01/14/2023    1:49 PM  Readmission Risk Prevention Plan  Post Dischage Appt   Complete  Medication Screening   Complete  Transportation Screening Complete Complete Complete  PCP or Specialist Appt within 5-7 Days  Complete   PCP or Specialist Appt within 3-5 Days Complete    Home Care Screening  Complete   Medication Review (RN CM)  Complete   HRI or Home Care Consult Complete    Social Work Consult for Recovery Care Planning/Counseling Complete    Palliative Care Screening Not Applicable    Medication Review Ocean[facility]) Complete

## 2023-05-24 NOTE — Consult Note (Signed)
[facility] Consult Follow-up  Patient Name: .Jermaine [name]  MRN: [id number]  DOB: 22-Jan-1966  Consult Order details:  Orders (From admission, onward)     Start     Ordered   05/21/23 0700  IP CONSULT TO PSYCHIATRY       Ordering Provider: Dolly Rias, MD  Provider:  (Not yet assigned)  Question Answer Comment  Location [facility]   Reason for Consult? suicidal ideation      05/21/23 0659             Mode of Visit: In person    Psychiatry Consult Evaluation  Service Date: May 24, 2023 LOS:  LOS: 3 days  Chief Complaint Alcohol Withdrawal  Primary Psychiatric Diagnoses  Alcohol Use Disorder, Severe Alcohol induced mood disorder  Assessment  Jermaine [name] is a 57 y.o. male with hx of severe alcohol use disorder c/b DT's and withdrawal seizures and suspected alcohol induced mood disorder admitted on 05/20/2023  6:58 PM for severe alcohol withdrawal and SI/HI. He had left AMA after hospitalziation 12/15-12/22 and immediately began drinking 2/5 of liquor prior to returning to hospital 05/20/23. Patient was started on phenobarb taper and has required high doses of ativan to manage patient's agitation likely secondary to alcohol withdrawal.    Psychiatry was consulted due to SI. IVC completed 05/21/23 due to concerns for SI/HI. Primary team contacted me that patient has completed his detox. Patient has been in good spirits, smiling and euthymic the last 48-72 hours. Future oriented on AA and reestablishign with his sponsor. Med compliant with Remeron and acamprosate. Continues to deny SI/HI. Low acute risk for suicide and no longer meets IVC criteria. Patient declines inpatient rehab service which he has completed 3 times previously as well as IOP.  Diagnoses:  Active Hospital problems: Principal Problem:   Alcohol withdrawal (HCC) Active Problems:   Thrombocytopenia (HCC)   Hepatic steatosis   Anxiety and depression   Alcohol-induced  mood disorder (HCC)   C. difficile colitis    Plan   #Alcohol Use Disorder, severe History of DT's and withdrawal seizures -last drink 05/20/23 -phenobarb taper per primary -CIWA w/ ativan (last 3: 3>0>4) -IM haloperidol 5 mg q6h prn for agitation -hydroxyzine 25 mg tid prn for anxiety  #Alcohol induced mood disorder -continue mirtazapine 15 mg at bedtime  -cont lexapro 10 mg daily for 7 days then discontinue to complete crosstaper ## Medical Decision Making Capacity: Not specifically addressed in this encounter  ## Further Work-up:  -- most recent EKG on 05/20/23 had QtC of 472  ASAM Dimensional Analysis and Severity rating: 0=no problem or stable / 1=mild / 2=moderate / 3=substantial / 4= severe Dimension 1 (intoxication/ withdrawal potential)- Severity rating= => 4: Incapacitated, with severe signs and symptoms of withdrawal Dimension 2 (Biomedical)- Severity rating => 4: Incapacitated, with severe medical problems Dimension 3 (emotional, behavioral, cognitive)- Severity rating => 3: Frequent impulses to harm self or others which are potentially destabilizing, but not imminently dangerous  Dimension 4 (Readiness to change)- Severity rating => 4: Unable to follow through, has little or no awareness of substance use or mental health problems and associated negative consequences Dimension 5 (Relapse/ risk for continued use)- Severity rating => 3: Little recognition and understanding of substance use relapse  Dimension 6 (Recovery environment)- Severity rating => 4: Environment is not supportive of addiction recovery and is hostile and toxic to recovery or treatment progress   ASAM corresponding LOC recommendations  - Inpatient Treatment (Level 4.0):  Severe problems in Dimensions 1, 2 or 3. Problems in the other Dimensions do not qualify for this level of care  ## Disposition:--psychiatrically cleared for discharge. Strongly recommended residential rehab for disposition however  patient has consistently declined and has decision making capacity ## Behavioral / Environmental: -Delirium Precautions: Delirium Interventions for Nursing and Staff: - RN to open blinds every AM. - To Bedside: Glasses, hearing aide, and pt's own shoes. Make available to patients. when possible and encourage use. - Encourage po fluids when appropriate, keep fluids within reach. - OOB to chair with meals. - Passive ROM exercises to all extremities with AM & PM care. - RN to assess orientation to person, time and place QAM and PRN. - Recommend extended visitation hours with familiar family/friends as feasible. - Staff to minimize disturbances at night. Turn off television when pt asleep or when not in use.    ## Safety and Observation Level:  - Based on my clinical evaluation, I estimate the patient to be at low risk of self harm in the current setting.  - At this time, we recommend  routine. This decision is based on my review of the chart including patient's history and current presentation, interview of the patient, mental status examination, and consideration of suicide risk including evaluating suicidal ideation, plan, intent, suicidal or self-harm behaviors, risk factors, and protective factors. This judgment is based on our ability to directly address suicide risk, implement suicide prevention strategies, and develop a safety plan while the patient is in the clinical setting. Please contact our team if there is a concern that risk level has changed.  CSSR Risk Category:low risk  Suicide Risk Assessment: Patient has following modifiable risk factors for suicide: access to guns, which we are addressing by advised to have guns removed, encourage talking with family; patient declined to have police pick up his gun stating he has never tried to harm himself; family is out of state and could not be reached over telephone Patient has following non-modifiable or demographic risk factors for suicide: male  gender and psychiatric hospitalization Patient has the following protective factors against suicide: Supportive family, no history of suicide attempts, and no history of NSSIB  Thank you for this consult request. Recommendations have been communicated to the primary team.  Patient provided outpatient psychiatric referral.   Miguel Rota, MD       History of Present Illness  Relevant Aspects of [facility] Course:  Admitted on 05/20/2023 for SI/HI and severe alcohol withdrawal. They were admitted to ICU with phenobarbital taper and requiring multiple doses of PRN ativan.   Patient Report:  Patient today reports doing good.  He denies any acute withdrawal symptoms. Has been smiling and euthymic for the last 2 followups. Reports good sleep and patient was observed eating lunch and joking. Patient asking to be discharged and denies withdrawal symptoms. I suggested inpatient rehab or IOP however patients "I've done those and learned everything I need to learn. I'm going to an AA meeting once I leave here and getting in with my sponsor again." Patient future oriented on finding his car as well.   Tolerating mirtazapine and acamprosate well. He denies SI/HI/AVH. Denies feeling hopeless and states "this is the best I've felt in a while."  He denies any acute alcohol cravings at this time.  Collateral Caesar Chestnut (sister) @ [phone] on 05/21/23 Patient has struggled with severe alcohol use for past 6 months.  During Thanksgiving, he had visited sister and mom in [place]  and had reportedly been sober and appropriately behaving but then told friend after return he had "hid his drinking well". He has attended multiple substance use rehab programs and they do not appear to have been effective.  Patient does not report SI/HI when he is sober.  After leaving AMA last hospitalization, he immediately restarted drinking alcohol.  2 days ago, he called mom and stated that he "needed $15,000 to travel the  world" and that "it was his money anyways".  Mother currently resides with sister in [place] due to medical problems. This was denied to him but sister was agreeable to paying his rent and bills. Yesterday, patient spoke with sister and mom and told mom to "shut up" and told sister "when I go down there, I'm going to kick your ass and put you in a chokehold". He also stated "all I've got is a 9 mm now".  He was finally agreeable to going back to the hospital to undergo medical detox for his alcohol use but due to concern for safety, sister called EMS for patient due to his level of belligerence and agitation over the phone.   Sister states that patient reportedly had set up a noose and tied it to a chair as a suicidal gesture and after completing detox at the hospital, he was transferred to [facility] in [place] for a week.  Sister reports that patient's experience there was horrific and reports that it was not helpful at all citing patient feeling fearful of his own life at the psychiatric hospital. Sister does not want patient to go to psychiatric hospital if at all possible.   I discussed that I would follow up with patient after he is more alert but set  expectations that residential rehab and most substance use treatment facilities are voluntary and patient has to be willing to quit alcohol. Sister reports understanding. All questions addressed.   Updated collateral 05/23/23 Patient appears to be doing much better with regards to his mood as well as feeling deeply apologetic regarding the threats he had made which led to his involuntary commitment.  She felt that the apology was sincere and does not feel that he is at risk of self-harm or harm to others.  She does feel that the alcohol has resulted in significant mood disturbances and is the primary etiology to any type of aggression he may display.  Sister does not feel that residential rehab will be beneficial for patient upon discharge.  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 Living Situation: lives alone Access to weapons/lethal means: has a 9 mm locked away at home  Substance History Alcohol: severe binge dirnking  Type of alcohol: liquor and beer Last Drink 05/20/23 Number of drinks per day: >10 History of alcohol withdrawal seizures: DT's and withdrawal seizures History of DT's: endorses Illicit drugs: denies Rehab hx: multiple [facility]  Exam Findings   Vital Signs:  Temp:  [97.9 F (36.6 C)-98.9 F (37.2 C)] 98.5 F (36.9 C) (12/27 1218) Pulse Rate:  [65-99] 77 (12/27 1218) Resp:  [16-20] 16 (12/27 1218) BP: (  114-145)/(78-96) 145/86 (12/27 1218) SpO2:  [94 %-98 %] 95 % (12/27 1218) Weight:  [89 kg] 89 kg (12/27 0503) Blood pressure (!) 145/86, pulse 77, temperature 98.5 F (36.9 C), resp. rate 16, height 5\' 10"  (1.778 m), weight 89 kg, SpO2 95%. Body mass index is 28.15 kg/m.  Physical Exam  Mental Status Exam: General Appearance: Casual  Orientation:  Full (Time, Place, and Person)  Memory:  Remote;   Fair  Concentration:  Concentration: Fair  Recall:  Fair  Attention  Fair  Eye Contact:  Fair  Speech:  Clear and Coherent  Language:  Fair  Volume:  Normal  Mood: "ok"  Affect:  Appropriate and Congruent  Thought Process:  Coherent and Goal Directed  Thought Content:  Logical  Suicidal Thoughts:  No  Homicidal Thoughts:  No  Judgement:  Impaired  Insight:  Fair  Psychomotor Activity:  Normal  Akathisia:  No  Fund of  Knowledge:  Fair      Assets:  Manufacturing systems engineer Leisure Time Social Support  Cognition:  WNL  ADL's:  Intact  AIMS (if indicated):        Other History   These have been pulled in through the EMR, reviewed, and updated if appropriate.  Family History:  The patient's family history is not on file. He was adopted.  Medical History: Past Medical History:  Diagnosis Date   Alcohol abuse    Anxiety    Class 1 obesity 09/14/2021   Depression     Surgical History: Past Surgical History:  Procedure Laterality Date   HERNIA REPAIR       Medications:   Current Facility-Administered Medications:    acamprosate (CAMPRAL) tablet 666 mg, 666 mg, Oral, TID WC, Gwenna Fuston, MD, 666 mg at 05/24/23 1314   acetaminophen (TYLENOL) tablet 1,000 mg, 1,000 mg, Oral, Q6H PRN, Dolly Rias, MD, 1,000 mg at 05/23/23 0618   cephALEXin (KEFLEX) capsule 500 mg, 500 mg, Oral, Q8H, Segars, Christiane Ha, MD, 500 mg at 05/24/23 1314   diphenhydrAMINE-zinc acetate (BENADRYL) 2-0.1 % cream, , Topical, TID PRN, Hughie Closs, MD   enoxaparin (LOVENOX) injection 40 mg, 40 mg, Subcutaneous, Q24H, Segars, Christiane Ha, MD, 40 mg at 05/24/23 1007   folic acid (FOLVITE) tablet 1 mg, 1 mg, Oral, Daily, Segars, Christiane Ha, MD, 1 mg at 05/24/23 1006   haloperidol lactate (HALDOL) injection 5 mg, 5 mg, Intramuscular, Q6H PRN, Park Pope, MD   hydrocortisone cream 1 %, , Topical, TID, Hughie Closs, MD, Given at 05/24/23 1006   hydrOXYzine (ATARAX) tablet 25 mg, 25 mg, Oral, TID PRN, Dolly Rias, MD, 25 mg at 05/22/23 2054   melatonin tablet 9 mg, 9 mg, Oral, QHS, Segars, Christiane Ha, MD, 9 mg at 05/23/23 2122   mirtazapine (REMERON) tablet 15 mg, 15 mg, Oral, QHS, Park Pope, MD, 15 mg at 05/23/23 2122   multivitamin with minerals tablet 1 tablet, 1 tablet, Oral, Daily, Segars, Christiane Ha, MD, 1 tablet at 05/24/23 1006   ondansetron (ZOFRAN) tablet 4 mg, 4 mg, Oral, Q6H PRN **OR** ondansetron (ZOFRAN) injection 4  mg, 4 mg, Intravenous, Q6H PRN, Dolly Rias, MD   Oral care mouth rinse, 15 mL, Mouth Rinse, PRN, Segars, Christiane Ha, MD   PHENobarbital (LUMINAL) tablet 64.8 mg, 64.8 mg, Oral, Q8H, 64.8 mg at 05/24/23 1037 **FOLLOWED BY** [START ON 05/25/2023] PHENobarbital (LUMINAL) tablet 32.4 mg, 32.4 mg, Oral, Q8H, Pahwani, Ravi, MD   polyethylene glycol (MIRALAX / GLYCOLAX) packet 17 g, 17 g, Oral, Daily PRN, Dolly Rias, MD  sodium chloride flush (NS) 0.9 % injection 3 mL, 3 mL, Intravenous, Q12H, Segars, Jonathan, MD, 3 mL at 05/24/23 1008   thiamine (VITAMIN B1) tablet 100 mg, 100 mg, Oral, Daily, Segars, Christiane Ha, MD, 100 mg at 05/24/23 1006   vancomycin (VANCOCIN) capsule 125 mg, 125 mg, Oral, QID, Dolly Rias, MD, 125 mg at 05/24/23 1314  Allergies: Allergies  Allergen Reactions   Ketamine     Hx of causing fever in 2024     Miguel Rota, MD

## 2023-06-03 ENCOUNTER — Institutional Professional Consult (permissible substitution) (INDEPENDENT_AMBULATORY_CARE_PROVIDER_SITE_OTHER): Payer: BLUE CROSS/BLUE SHIELD

## 2023-06-25 ENCOUNTER — Encounter (HOSPITAL_COMMUNITY): Payer: Self-pay | Admitting: Family Medicine

## 2023-06-25 ENCOUNTER — Emergency Department (HOSPITAL_COMMUNITY): Payer: BLUE CROSS/BLUE SHIELD

## 2023-06-25 ENCOUNTER — Inpatient Hospital Stay (HOSPITAL_COMMUNITY)
Admission: EM | Admit: 2023-06-25 | Discharge: 2023-07-12 | DRG: 894 | Discharge status: Against Medical Advice | Payer: BLUE CROSS/BLUE SHIELD | Attending: Internal Medicine | Admitting: Internal Medicine

## 2023-06-25 ENCOUNTER — Other Ambulatory Visit: Payer: Self-pay

## 2023-06-25 DIAGNOSIS — F10931 Alcohol use, unspecified with withdrawal delirium: Principal | ICD-10-CM | POA: Diagnosis present

## 2023-06-25 DIAGNOSIS — Z781 Physical restraint status: Secondary | ICD-10-CM | POA: Diagnosis not present

## 2023-06-25 DIAGNOSIS — Y908 Blood alcohol level of 240 mg/100 ml or more: Secondary | ICD-10-CM | POA: Diagnosis present

## 2023-06-25 DIAGNOSIS — Z79899 Other long term (current) drug therapy: Secondary | ICD-10-CM | POA: Diagnosis not present

## 2023-06-25 DIAGNOSIS — E861 Hypovolemia: Secondary | ICD-10-CM | POA: Diagnosis present

## 2023-06-25 DIAGNOSIS — F32A Depression, unspecified: Secondary | ICD-10-CM | POA: Diagnosis present

## 2023-06-25 DIAGNOSIS — J9601 Acute respiratory failure with hypoxia: Secondary | ICD-10-CM | POA: Diagnosis present

## 2023-06-25 DIAGNOSIS — I959 Hypotension, unspecified: Secondary | ICD-10-CM | POA: Diagnosis not present

## 2023-06-25 DIAGNOSIS — G4089 Other seizures: Secondary | ICD-10-CM | POA: Diagnosis present

## 2023-06-25 DIAGNOSIS — F10932 Alcohol use, unspecified with withdrawal with perceptual disturbance: Secondary | ICD-10-CM

## 2023-06-25 DIAGNOSIS — R1310 Dysphagia, unspecified: Secondary | ICD-10-CM | POA: Diagnosis not present

## 2023-06-25 DIAGNOSIS — F10229 Alcohol dependence with intoxication, unspecified: Secondary | ICD-10-CM | POA: Diagnosis present

## 2023-06-25 DIAGNOSIS — R296 Repeated falls: Secondary | ICD-10-CM | POA: Diagnosis present

## 2023-06-25 DIAGNOSIS — I10 Essential (primary) hypertension: Secondary | ICD-10-CM | POA: Diagnosis present

## 2023-06-25 DIAGNOSIS — K5900 Constipation, unspecified: Secondary | ICD-10-CM | POA: Diagnosis present

## 2023-06-25 DIAGNOSIS — K760 Fatty (change of) liver, not elsewhere classified: Secondary | ICD-10-CM | POA: Diagnosis present

## 2023-06-25 DIAGNOSIS — Z1152 Encounter for screening for COVID-19: Secondary | ICD-10-CM | POA: Diagnosis not present

## 2023-06-25 DIAGNOSIS — D72829 Elevated white blood cell count, unspecified: Secondary | ICD-10-CM | POA: Diagnosis present

## 2023-06-25 DIAGNOSIS — F10939 Alcohol use, unspecified with withdrawal, unspecified: Secondary | ICD-10-CM | POA: Diagnosis present

## 2023-06-25 DIAGNOSIS — R569 Unspecified convulsions: Secondary | ICD-10-CM | POA: Diagnosis not present

## 2023-06-25 DIAGNOSIS — G9341 Metabolic encephalopathy: Secondary | ICD-10-CM | POA: Diagnosis present

## 2023-06-25 DIAGNOSIS — Z885 Allergy status to narcotic agent status: Secondary | ICD-10-CM | POA: Diagnosis not present

## 2023-06-25 DIAGNOSIS — F1024 Alcohol dependence with alcohol-induced mood disorder: Secondary | ICD-10-CM | POA: Diagnosis present

## 2023-06-25 DIAGNOSIS — F10231 Alcohol dependence with withdrawal delirium: Secondary | ICD-10-CM | POA: Diagnosis present

## 2023-06-25 DIAGNOSIS — F419 Anxiety disorder, unspecified: Secondary | ICD-10-CM | POA: Diagnosis present

## 2023-06-25 DIAGNOSIS — R5381 Other malaise: Secondary | ICD-10-CM | POA: Diagnosis not present

## 2023-06-25 DIAGNOSIS — R451 Restlessness and agitation: Secondary | ICD-10-CM | POA: Diagnosis not present

## 2023-06-25 DIAGNOSIS — F05 Delirium due to known physiological condition: Secondary | ICD-10-CM | POA: Diagnosis not present

## 2023-06-25 DIAGNOSIS — E876 Hypokalemia: Secondary | ICD-10-CM | POA: Diagnosis present

## 2023-06-25 DIAGNOSIS — R509 Fever, unspecified: Secondary | ICD-10-CM | POA: Diagnosis not present

## 2023-06-25 LAB — CBC
HCT: 48.7 % (ref 39.0–52.0)
Hemoglobin: 17.2 g/dL — ABNORMAL HIGH (ref 13.0–17.0)
MCH: 32.5 pg (ref 26.0–34.0)
MCHC: 35.3 g/dL (ref 30.0–36.0)
MCV: 91.9 fL (ref 80.0–100.0)
Platelets: 241 10*3/uL (ref 150–400)
RBC: 5.3 MIL/uL (ref 4.22–5.81)
RDW: 12.4 % (ref 11.5–15.5)
WBC: 9.7 10*3/uL (ref 4.0–10.5)
nRBC: 0 % (ref 0.0–0.2)

## 2023-06-25 LAB — CBG MONITORING, ED: Glucose-Capillary: 86 mg/dL (ref 70–99)

## 2023-06-25 LAB — COMPREHENSIVE METABOLIC PANEL
ALT: 50 U/L — ABNORMAL HIGH (ref 0–44)
AST: 50 U/L — ABNORMAL HIGH (ref 15–41)
Albumin: 4.4 g/dL (ref 3.5–5.0)
Alkaline Phosphatase: 60 U/L (ref 38–126)
Anion gap: 16 — ABNORMAL HIGH (ref 5–15)
BUN: 18 mg/dL (ref 6–20)
CO2: 25 mmol/L (ref 22–32)
Calcium: 8.7 mg/dL — ABNORMAL LOW (ref 8.9–10.3)
Chloride: 98 mmol/L (ref 98–111)
Creatinine, Ser: 1.05 mg/dL (ref 0.61–1.24)
GFR, Estimated: >60 mL/min (ref >60)
Glucose, Bld: 90 mg/dL (ref 70–99)
Potassium: 3.5 mmol/L (ref 3.5–5.1)
Sodium: 139 mmol/L (ref 135–145)
Total Bilirubin: 0.7 mg/dL (ref 0.0–1.2)
Total Protein: 7.5 g/dL (ref 6.5–8.1)

## 2023-06-25 LAB — RAPID URINE DRUG SCREEN, HOSP PERFORMED
Amphetamines: NOT DETECTED
Barbiturates: NOT DETECTED
Benzodiazepines: NOT DETECTED
Cocaine: NOT DETECTED
Opiates: NOT DETECTED
Tetrahydrocannabinol: NOT DETECTED

## 2023-06-25 LAB — RESP PANEL BY RT-PCR (RSV, FLU A&B, COVID)  RVPGX2
Influenza A by PCR: NEGATIVE
Influenza B by PCR: NEGATIVE
Resp Syncytial Virus by PCR: NEGATIVE
SARS Coronavirus 2 by RT PCR: NEGATIVE

## 2023-06-25 LAB — ETHANOL: Alcohol, Ethyl (B): 283 mg/dL — ABNORMAL HIGH (ref <10)

## 2023-06-25 MED ORDER — SODIUM CHLORIDE 0.9% FLUSH
3.0000 mL | Freq: Two times a day (BID) | INTRAVENOUS | Status: DC
  Administered 2023-06-25 – 2023-07-12 (×31): 3 mL via INTRAVENOUS

## 2023-06-25 MED ORDER — LORAZEPAM 1 MG PO TABS
1.0000 mg | ORAL_TABLET | ORAL | Status: DC | PRN
Start: 2023-06-25 — End: 2023-06-28
  Administered 2023-06-26: 2 mg via ORAL
  Administered 2023-06-27: 4 mg via ORAL
  Filled 2023-06-25: qty 4
  Filled 2023-06-25: qty 2

## 2023-06-25 MED ORDER — LORAZEPAM 2 MG/ML IJ SOLN
1.0000 mg | INTRAMUSCULAR | Status: DC | PRN
  Administered 2023-06-26: 4 mg via INTRAVENOUS
  Administered 2023-06-26: 2 mg via INTRAVENOUS
  Administered 2023-06-27 (×2): 4 mg via INTRAVENOUS
  Filled 2023-06-25: qty 1

## 2023-06-25 MED ORDER — ONDANSETRON HCL 4 MG PO TABS: 4.0000 mg | ORAL_TABLET | Freq: Four times a day (QID) | ORAL | Status: DC | PRN

## 2023-06-25 MED ORDER — LORAZEPAM 2 MG/ML IJ SOLN
0.0000 mg | INTRAMUSCULAR | Status: DC
  Administered 2023-06-25 – 2023-06-26 (×4): 2 mg via INTRAVENOUS
  Administered 2023-06-27: 4 mg via INTRAVENOUS
  Administered 2023-06-27: 1 mg via INTRAVENOUS
  Filled 2023-06-25: qty 1
  Filled 2023-06-25 (×3): qty 2
  Filled 2023-06-25 (×3): qty 1

## 2023-06-25 MED ORDER — ONDANSETRON HCL 4 MG/2ML IJ SOLN: 4.0000 mg | Freq: Four times a day (QID) | INTRAMUSCULAR | Status: DC | PRN

## 2023-06-25 MED ORDER — ADULT MULTIVITAMIN W/MINERALS CH
1.0000 | ORAL_TABLET | Freq: Every day | ORAL | Status: DC
  Administered 2023-06-26: 1 via ORAL
  Filled 2023-06-25: qty 1

## 2023-06-25 MED ORDER — DEXMEDETOMIDINE HCL IN NACL 400 MCG/100ML IV SOLN
0.0000 ug/kg/h | INTRAVENOUS | Status: DC
  Administered 2023-06-25: 0.2 ug/kg/h via INTRAVENOUS
  Administered 2023-06-26: 0.7 ug/kg/h via INTRAVENOUS
  Administered 2023-06-26: 0.4 ug/kg/h via INTRAVENOUS
  Administered 2023-06-26: 0.6 ug/kg/h via INTRAVENOUS
  Administered 2023-06-27: 0.7 ug/kg/h via INTRAVENOUS
  Administered 2023-06-27 (×3): 1.2 ug/kg/h via INTRAVENOUS
  Administered 2023-06-27: 1 ug/kg/h via INTRAVENOUS
  Administered 2023-06-28: 1.5 ug/kg/h via INTRAVENOUS
  Administered 2023-06-28: 1.2 ug/kg/h via INTRAVENOUS
  Administered 2023-06-28: 1.7 ug/kg/h via INTRAVENOUS
  Administered 2023-06-28: 1.5 ug/kg/h via INTRAVENOUS
  Administered 2023-06-28 (×2): 1.2 ug/kg/h via INTRAVENOUS
  Filled 2023-06-25 (×16): qty 100

## 2023-06-25 MED ORDER — ENOXAPARIN SODIUM 40 MG/0.4ML IJ SOSY
40.0000 mg | PREFILLED_SYRINGE | INTRAMUSCULAR | Status: DC
  Administered 2023-06-26 – 2023-06-30 (×5): 40 mg via SUBCUTANEOUS
  Filled 2023-06-25 (×5): qty 0.4

## 2023-06-25 MED ORDER — MIDAZOLAM HCL 2 MG/2ML IJ SOLN
4.0000 mg | Freq: Once | INTRAMUSCULAR | Status: AC
Start: 2023-06-25 — End: 2023-06-25
  Administered 2023-06-25: 4 mg via INTRAVENOUS
  Filled 2023-06-25: qty 4

## 2023-06-25 MED ORDER — THIAMINE HCL 100 MG/ML IJ SOLN
100.0000 mg | Freq: Every day | INTRAMUSCULAR | Status: DC
  Administered 2023-06-25 – 2023-07-01 (×6): 100 mg via INTRAVENOUS
  Filled 2023-06-25 (×6): qty 2

## 2023-06-25 MED ORDER — LORAZEPAM 2 MG/ML IJ SOLN
2.0000 mg | Freq: Once | INTRAMUSCULAR | Status: AC
Start: 2023-06-25 — End: 2023-06-25
  Administered 2023-06-25: 2 mg via INTRAVENOUS
  Filled 2023-06-25: qty 1

## 2023-06-25 MED ORDER — SODIUM CHLORIDE 0.9 % IV BOLUS
1000.0000 mL | Freq: Once | INTRAVENOUS | Status: AC
  Administered 2023-06-25: 1000 mL via INTRAVENOUS

## 2023-06-25 MED ORDER — LORAZEPAM 2 MG/ML IJ SOLN
4.0000 mg | Freq: Once | INTRAMUSCULAR | Status: AC
  Administered 2023-06-25: 4 mg via INTRAVENOUS
  Filled 2023-06-25: qty 2

## 2023-06-25 MED ORDER — ACETAMINOPHEN 650 MG RE SUPP
650.0000 mg | Freq: Four times a day (QID) | RECTAL | Status: DC | PRN
Start: 2023-06-25 — End: 2023-07-01

## 2023-06-25 MED ORDER — SENNOSIDES-DOCUSATE SODIUM 8.6-50 MG PO TABS
1.0000 | ORAL_TABLET | Freq: Every evening | ORAL | Status: DC | PRN
Start: 2023-06-25 — End: 2023-07-01

## 2023-06-25 MED ORDER — LORAZEPAM 2 MG/ML IJ SOLN
0.0000 mg | Freq: Three times a day (TID) | INTRAMUSCULAR | Status: DC
  Filled 2023-06-25: qty 2

## 2023-06-25 MED ORDER — CHLORHEXIDINE GLUCONATE CLOTH 2 % EX PADS
6.0000 | MEDICATED_PAD | Freq: Every day | CUTANEOUS | Status: DC
  Administered 2023-06-26 – 2023-07-09 (×14): 6 via TOPICAL

## 2023-06-25 MED ORDER — LORAZEPAM 2 MG/ML IJ SOLN: 2.0000 mg | Freq: Once | INTRAMUSCULAR | Status: DC

## 2023-06-25 MED ORDER — LORAZEPAM 1 MG PO TABS: 0.0000 mg | ORAL_TABLET | Freq: Two times a day (BID) | ORAL | Status: DC

## 2023-06-25 MED ORDER — LORAZEPAM 1 MG PO TABS: 0.0000 mg | ORAL_TABLET | Freq: Four times a day (QID) | ORAL | Status: DC

## 2023-06-25 MED ORDER — LORAZEPAM 2 MG/ML IJ SOLN: 0.0000 mg | Freq: Two times a day (BID) | INTRAMUSCULAR | Status: DC

## 2023-06-25 MED ORDER — LACTATED RINGERS IV SOLN
INTRAVENOUS | Status: DC
Start: 2023-06-25 — End: 2023-06-26

## 2023-06-25 MED ORDER — THIAMINE MONONITRATE 100 MG PO TABS
100.0000 mg | ORAL_TABLET | Freq: Every day | ORAL | Status: DC
  Administered 2023-06-26: 100 mg via ORAL
  Filled 2023-06-25: qty 1

## 2023-06-25 MED ORDER — ACETAMINOPHEN 325 MG PO TABS: 650.0000 mg | ORAL_TABLET | Freq: Four times a day (QID) | ORAL | Status: DC | PRN

## 2023-06-25 MED ORDER — LORAZEPAM 2 MG/ML IJ SOLN
0.0000 mg | Freq: Four times a day (QID) | INTRAMUSCULAR | Status: DC
  Administered 2023-06-25: 2 mg via INTRAVENOUS
  Filled 2023-06-25: qty 1

## 2023-06-25 MED ORDER — FOLIC ACID 1 MG PO TABS
1.0000 mg | ORAL_TABLET | Freq: Every day | ORAL | Status: DC
  Administered 2023-06-26: 1 mg via ORAL
  Filled 2023-06-25 (×2): qty 1

## 2023-06-25 NOTE — ED Notes (Signed)
Pt up out of bed, pulled off cardiac leads and vital sign equipment. Pt urinated in floor, in room, pt stated "the people were chasing me, there's a lot going on out there".

## 2023-06-25 NOTE — ED Triage Notes (Signed)
Pt BIBA from doctors office. Concern for possible seizure. Pt was post ictal per EMS. Pt's last drink was several hours ago- states they usually drink a pint a day  In triage pt anxious, and states they have gone through ETOH withdrawals several times that required hospitalization

## 2023-06-25 NOTE — ED Provider Notes (Signed)
Emergency Department Provider Note   I have reviewed the triage vital signs and the nursing notes.   HISTORY  Chief Complaint Alcohol Intoxication and seizure like activity   HPI Jermaine [name] is a 58 y.o. male with past history reviewed below including severe alcohol use disorder and DTs/seizure presents to the emergency department from urgent care where he presented initially thinking that he may be about to have a seizure.  He felt somewhat lightheaded like he was going to pass out.  No report of witnessed seizure activity but EMS reports patient appearing post-ictal initially.  He was tachycardic and anxious appearing so was transported to the ED. Patient is me that 5 days ago he began drinking heavily once again after a period of several weeks of not drinking.  Denies other drug use.  No chest pain.  Last drink was 2 hours prior. Denies SI/HI.    Past Medical History:  Diagnosis Date   Alcohol abuse    Anxiety    Class 1 obesity 09/14/2021   Depression     Review of Systems  Constitutional: No fever/chills Cardiovascular: Denies chest pain. Positive near syncope.  Respiratory: Mild shortness of breath. Gastrointestinal: No abdominal pain.  No nausea, no vomiting.   Musculoskeletal: Negative for back pain. Skin: Negative for rash. Neurological: Negative for headaches, focal weakness or numbness.  ____________________________________________   PHYSICAL EXAM:  VITAL SIGNS: ED Triage Vitals  Encounter Vitals Group     BP 06/25/23 1805 107/81     Pulse Rate 06/25/23 1805 (!) 136     Resp 06/25/23 1806 20     Temp 06/25/23 1805 99.1 F (37.3 C)     Temp src --      SpO2 06/25/23 1757 96 %     Weight 06/25/23 1806 200 lb (90.7 kg)     Height 06/25/23 1806 5\' 10"  (1.778 m)   Constitutional: Alert and oriented. Patient does appear slightly anxious and frequent shifting.  Eyes: Conjunctivae are normal.  Head: Atraumatic. Nose: No  congestion/rhinnorhea. Mouth/Throat: Mucous membranes are slightly dry.  Neck: No stridor.   Cardiovascular: Tachycardia. Good peripheral circulation. Grossly normal heart sounds.   Respiratory: Normal respiratory effort.  No retractions. Lungs CTAB. Gastrointestinal: Soft and nontender. No distention.  Musculoskeletal: No lower extremity tenderness nor edema. No gross deformities of extremities. Neurologic:  Normal speech and language. No gross focal neurologic deficits are appreciated. No severe tremors.  Skin:  Skin is warm, dry and intact. No rash noted.  ____________________________________________   LABS (all labs ordered are listed, but only abnormal results are displayed)  Labs Reviewed  COMPREHENSIVE METABOLIC PANEL - Abnormal; Notable for the following components:      Result Value   Calcium 8.7 (*)    AST 50 (*)    ALT 50 (*)    Anion gap 16 (*)    All other components within normal limits  ETHANOL - Abnormal; Notable for the following components:   Alcohol, Ethyl (B) 283 (*)    All other components within normal limits  CBC - Abnormal; Notable for the following components:   Hemoglobin 17.2 (*)    All other components within normal limits  RESP PANEL BY RT-PCR (RSV, FLU A&B, COVID)  [id number]  RAPID URINE DRUG SCREEN, HOSP PERFORMED  CBG MONITORING, ED   ____________________________________________  EKG   EKG Interpretation Date/Time:  Tuesday June 25 2023 18:19:22 EST Ventricular Rate:  134 PR Interval:  119 QRS Duration:  83 QT Interval:  308 QTC Calculation: 460 R Axis:   104  Text Interpretation: Sinus tachycardia Paired ventricular premature complexes Right axis deviation Confirmed by Alona Bene [phone]) on 06/25/2023 6:36:47 PM        ____________________________________________  RADIOLOGY  DG Chest Portable 1 View Result Date: 06/25/2023 CLINICAL DATA:  Shortness of breath.  Possible seizure EXAM: PORTABLE CHEST 1 VIEW COMPARISON:   05/03/2023 FINDINGS: The heart size and mediastinal contours are within normal limits. Both lungs are clear. The visualized skeletal structures are unremarkable. IMPRESSION: No active disease. Electronically Signed   By: Minerva Fester M.D.   On: 06/25/2023 18:42    ____________________________________________   PROCEDURES  Procedure(s) performed:   Procedures  CRITICAL CARE Performed by: Maia Plan Total critical care time: 75 minutes Critical care time was exclusive of separately billable procedures and treating other patients. Critical care was necessary to treat or prevent imminent or life-threatening deterioration. Critical care was time spent personally by me on the following activities: development of treatment plan with patient and/or surrogate as well as nursing, discussions with consultants, evaluation of patient's response to treatment, examination of patient, obtaining history from patient or surrogate, ordering and performing treatments and interventions, ordering and review of laboratory studies, ordering and review of radiographic studies, pulse oximetry and re-evaluation of patient's condition.  Alona Bene, MD Emergency Medicine  ____________________________________________   INITIAL IMPRESSION / ASSESSMENT AND PLAN / ED COURSE  Pertinent labs & imaging results that were available during my care of the patient were reviewed by me and considered in my medical decision making (see chart for details).   This patient is Presenting for Evaluation of near syncope, which does require a range of treatment options, and is a complaint that involves a high risk of morbidity and mortality.  The Differential Diagnoses include EtOH intoxication, DTs, withdrawls, seizure, PE, dehydration, etc.  Critical Interventions-    Medications  LORazepam (ATIVAN) injection 0-4 mg (2 mg Intravenous Given 06/25/23 1931)    Or  LORazepam (ATIVAN) tablet 0-4 mg ( Oral See Alternative  06/25/23 1931)  LORazepam (ATIVAN) injection 0-4 mg (has no administration in time range)    Or  LORazepam (ATIVAN) tablet 0-4 mg (has no administration in time range)  thiamine (VITAMIN B1) tablet 100 mg ( Oral See Alternative 06/25/23 1836)    Or  thiamine (VITAMIN B1) injection 100 mg (100 mg Intravenous Given 06/25/23 1836)  dexmedetomidine (PRECEDEX) 400 MCG/100ML (4 mcg/mL) infusion (has no administration in time range)  LORazepam (ATIVAN) injection 2 mg (2 mg Intravenous Given 06/25/23 1836)  sodium chloride 0.9 % bolus 1,000 mL (0 mLs Intravenous Stopped 06/25/23 2058)  LORazepam (ATIVAN) injection 4 mg (4 mg Intravenous Given 06/25/23 2002)  midazolam (VERSED) injection 4 mg (4 mg Intravenous Given 06/25/23 2032)    Reassessment after intervention: Patient with continued agitation.    I did obtain Additional Historical Information from EMS.   I decided to review pertinent External Data, and in summary patient with history of complicated withdrawal including in late Dec 2024 with admission that required Precedex and Phenobarbital.    Clinical Laboratory Tests Ordered, included EtOH 283.  COVID and UDS negative.  Normal bilirubin.  No leukocytosis.  Radiologic Tests Ordered, included CXR. I independently interpreted the images and agree with radiology interpretation.   Cardiac Monitor Tracing which shows sinus    Social Determinants of Health Risk patient with EtOH abuse history and complicated withdrawal history.   Consult complete with [facility]. Plan for admit.  Medical Decision Making: Summary:  Arrives to the emergency department with feeling of nearly having a seizure.  There is some question of being postictal on scene with EMS but with me is awake, alert, does seem somewhat anxious.  Does have history of complicated withdrawal but reports heavy drinking over the past 5 days rather than cutting back.  Given his history I will treat aggressively with Ativan and IV fluids follow labs  are pending.  My overall suspicion for PE is lower although did have some hypoxemia with EMS. Will obtain head CT as well.   Reevaluation with update and discussion with patient.  He remains moderate to severely agitated and confused after multiple doses of Ativan and Versed.  Planning to transition to Precedex.  Patient reassessed and well controlled on Precedex. Plan for admit to [facility].   Patient's presentation is most consistent with acute presentation with potential threat to life or bodily function.   Disposition: admit  ____________________________________________  FINAL CLINICAL IMPRESSION(S) / ED DIAGNOSES  Final diagnoses:  DTs (delirium tremens) (HCC)     Note:  This document was prepared using Dragon voice recognition software and may include unintentional dictation errors.  Alona Bene, MD, [facility] Emergency Medicine    Jazir Newey, Arlyss Repress, MD 06/28/23 [phone]

## 2023-06-25 NOTE — ED Notes (Signed)
Pt difficult to redirect, constantly trying to get up off stretcher and pull off equipment. Medication not effective at this time, MD aware.

## 2023-06-25 NOTE — H&P (Signed)
History and Physical    Jermaine [name] BMW:[id number] DOB: 1966-02-28 DOA: 06/25/2023  PCP: Farris Has, MD   Patient coming from: Home   Chief Complaint: Lightheaded, anxious, tremulous, confused  HPI: Jermaine [name] is a 58 y.o. male with medical history significant for anxiety, depression, and severe alcohol dependence with history of complicated withdrawal who presents with lightheadedness, anxiety, tremors, and confusion.   Patient reported that he had felt lightheaded, was worried that he was going to pass out or have a seizure, and was noted at urgent care to be anxious and tachycardic, and he was sent to the ED.  Patient stated that he drinks a pint of liquor per day and his last drink was several hours prior to arrival in the ED.  ED Course: Upon arrival to the ED, patient is found to be afebrile and saturating mid 80s to 90s on room air with tachypnea, tachycardia, and stable blood pressure.  Labs are most notable for ethanol 283.  No acute findings seen on chest x-ray.   Patient was treated with 6 mg of Ativan and 4 mg of Versed in the ED, remained in florid DTs, and was started on Precedex infusion.  Review of Systems:  ROS limited by patient's clinical condition.  Past Medical History:  Diagnosis Date   Alcohol abuse    Anxiety    Class 1 obesity 09/14/2021   Depression     Past Surgical History:  Procedure Laterality Date   HERNIA REPAIR      Social History:   reports that he has never smoked. He has never been exposed to tobacco smoke. He has never used smokeless tobacco. He reports current alcohol use of about 20.0 standard drinks of alcohol per week. He reports that he does not currently use drugs.  Allergies  Allergen Reactions   Ketamine     Hx of causing fever in 2024    Codeine Itching    Family History  Adopted: Yes     Prior to Admission medications   Medication Sig Start Date End Date Taking? Authorizing Provider  hydrOXYzine (ATARAX) 25 MG  tablet Take 50 mg by mouth every 8 (eight) hours as needed for anxiety. 06/10/23  Yes [provider]  acamprosate (CAMPRAL) 333 MG tablet Take 666 mg by mouth 3 (three) times daily with meals. Patient not taking: Reported on 06/25/2023    [provider]  escitalopram (LEXAPRO) 10 MG tablet Take 1 tablet (10 mg total) by mouth daily for 7 days. Patient not taking: Reported on 06/25/2023 05/24/23 05/31/23  Hughie Closs, MD  folic acid (FOLVITE) 1 MG tablet Take 1 tablet (1 mg total) by mouth daily. Patient not taking: Reported on 05/21/2023 01/28/23   Alberteen Sam, MD  mirtazapine (REMERON) 15 MG tablet Take 1 tablet (15 mg total) by mouth at bedtime. Patient not taking: Reported on 06/25/2023 05/24/23 06/23/23  Hughie Closs, MD  mirtazapine (REMERON) 30 MG tablet Take 30 mg by mouth at bedtime. Patient not taking: Reported on 06/25/2023    [provider]  thiamine (VITAMIN B-1) 100 MG tablet Take 1 tablet (100 mg total) by mouth daily. Patient not taking: Reported on 06/25/2023 05/20/22   [name], MD    Physical Exam: Vitals:   06/25/23 2126 06/25/23 2130 06/25/23 2142 06/25/23 2200  BP: 131/78 119/70 128/74 125/85  Pulse: (!) 118 (!) 115 (!) 111 (!) 111  Resp: (!) 21 (!) 21 20 (!) 25  Temp:  TempSrc:      SpO2: 93% 95% 97% 98%  Weight:      Height:        Constitutional: Diaphoresis, no pallor  Eyes: PERTLA, lids and conjunctivae normal ENMT: Mucous membranes are moist. Posterior pharynx clear of any exudate or lesions.   Neck: supple, no masses  Respiratory: no wheezing, no crackles. Mild tachypnea.  Cardiovascular: S1 & S2 heard, regular rate and rhythm. No extremity edema.   Abdomen: Soft, no guarding. Bowel sounds active.  Musculoskeletal: no clubbing / cyanosis. No joint deformity upper and lower extremities.   Skin: no significant rashes, lesions, ulcers. Warm, dry, well-perfused. Neurologic: No gross facial asymmetry, PERRL. Moving  all extremities. Sleeping, opens eyes and makes brief eye-contact but not answering questions.      Labs and Imaging on Admission: I have personally reviewed following labs and imaging studies  CBC: Recent Labs  Lab 06/25/23 1813  WBC 9.7  HGB 17.2*  HCT 48.7  MCV 91.9  PLT 241   Basic Metabolic Panel: Recent Labs  Lab 06/25/23 1813  NA 139  K 3.5  CL 98  CO2 25  GLUCOSE 90  BUN 18  CREATININE 1.05  CALCIUM 8.7*   GFR: Estimated Creatinine Clearance: 87.9 mL/min (by C-G formula based on SCr of 1.05 mg/dL). Liver Function Tests: Recent Labs  Lab 06/25/23 1813  AST 50*  ALT 50*  ALKPHOS 60  BILITOT 0.7  PROT 7.5  ALBUMIN 4.4   No results for input(s): "LIPASE", "AMYLASE" in the last 168 hours. No results for input(s): "AMMONIA" in the last 168 hours. Coagulation Profile: No results for input(s): "INR", "PROTIME" in the last 168 hours. Cardiac Enzymes: No results for input(s): "CKTOTAL", "CKMB", "CKMBINDEX", "TROPONINI" in the last 168 hours. BNP (last 3 results) No results for input(s): "PROBNP" in the last 8760 hours. HbA1C: No results for input(s): "HGBA1C" in the last 72 hours. CBG: Recent Labs  Lab 06/25/23 1829  GLUCAP 86   Lipid Profile: No results for input(s): "CHOL", "HDL", "LDLCALC", "TRIG", "CHOLHDL", "LDLDIRECT" in the last 72 hours. Thyroid Function Tests: No results for input(s): "TSH", "T4TOTAL", "FREET4", "T3FREE", "THYROIDAB" in the last 72 hours. Anemia Panel: No results for input(s): "VITAMINB12", "FOLATE", "FERRITIN", "TIBC", "IRON", "RETICCTPCT" in the last 72 hours. Urine analysis:    Component Value Date/Time   COLORURINE YELLOW 05/12/2023 1504   APPEARANCEUR CLEAR 05/12/2023 1504   LABSPEC 1.013 05/12/2023 1504   PHURINE 5.0 05/12/2023 1504   GLUCOSEU NEGATIVE 05/12/2023 1504   HGBUR NEGATIVE 05/12/2023 1504   BILIRUBINUR NEGATIVE 05/12/2023 1504   KETONESUR 20 (A) 05/12/2023 1504   PROTEINUR NEGATIVE 05/12/2023 1504    NITRITE NEGATIVE 05/12/2023 1504   LEUKOCYTESUR NEGATIVE 05/12/2023 1504   Sepsis Labs: @LABRCNTIP (procalcitonin:4,lacticidven:4) ) Recent Results (from the past 240 hours)  Resp panel by RT-PCR (RSV, Flu A&B, Covid) Anterior Nasal Swab     Status: None   Collection Time: 06/25/23  6:40 PM   Specimen: Anterior Nasal Swab  Result Value Ref Range Status   SARS Coronavirus 2 by RT PCR NEGATIVE NEGATIVE Final    Comment: (NOTE) SARS-CoV-2 target nucleic acids are NOT DETECTED.  The SARS-CoV-2 RNA is generally detectable in upper respiratory specimens during the acute phase of infection. The lowest concentration of SARS-CoV-2 viral copies this assay can detect is 138 copies/mL. A negative result does not preclude SARS-Cov-2 infection and should not be used as the sole basis for treatment or other patient management decisions. A negative result may  occur with  improper specimen collection/handling, submission of specimen other than nasopharyngeal swab, presence of viral mutation(s) within the areas targeted by this assay, and inadequate number of viral copies(<138 copies/mL). A negative result must be combined with clinical observations, patient history, and epidemiological information. The expected result is Negative.  Fact Sheet for Patients:  BloggerCourse.com  Fact Sheet for Healthcare Providers:  SeriousBroker.it  This test is no t yet approved or cleared by the [place] FDA and  has been authorized for detection and/or diagnosis of SARS-CoV-2 by FDA under an Emergency Use Authorization (EUA). This EUA will remain  in effect (meaning this test can be used) for the duration of the COVID-19 declaration under Section 564(b)(1) of the Act, 21 U.S.C.section 360bbb-3(b)(1), unless the authorization is terminated  or revoked sooner.       Influenza A by PCR NEGATIVE NEGATIVE Final   Influenza B by PCR NEGATIVE NEGATIVE Final     Comment: (NOTE) The Xpert Xpress SARS-CoV-2/FLU/RSV plus assay is intended as an aid in the diagnosis of influenza from Nasopharyngeal swab specimens and should not be used as a sole basis for treatment. Nasal washings and aspirates are unacceptable for Xpert Xpress SARS-CoV-2/FLU/RSV testing.  Fact Sheet for Patients: BloggerCourse.com  Fact Sheet for Healthcare Providers: SeriousBroker.it  This test is not yet approved or cleared by the [place] FDA and has been authorized for detection and/or diagnosis of SARS-CoV-2 by FDA under an Emergency Use Authorization (EUA). This EUA will remain in effect (meaning this test can be used) for the duration of the COVID-19 declaration under Section 564(b)(1) of the Act, 21 U.S.C. section 360bbb-3(b)(1), unless the authorization is terminated or revoked.     Resp Syncytial Virus by PCR NEGATIVE NEGATIVE Final    Comment: (NOTE) Fact Sheet for Patients: BloggerCourse.com  Fact Sheet for Healthcare Providers: SeriousBroker.it  This test is not yet approved or cleared by the [place] FDA and has been authorized for detection and/or diagnosis of SARS-CoV-2 by FDA under an Emergency Use Authorization (EUA). This EUA will remain in effect (meaning this test can be used) for the duration of the COVID-19 declaration under Section 564(b)(1) of the Act, 21 U.S.C. section 360bbb-3(b)(1), unless the authorization is terminated or revoked.  Performed at [facility], [address] [place], [place] [address]      Radiological Exams on Admission: DG Chest Portable 1 View Result Date: 06/25/2023 CLINICAL DATA:  Shortness of breath.  Possible seizure EXAM: PORTABLE CHEST 1 VIEW COMPARISON:  05/03/2023 FINDINGS: The heart size and mediastinal contours are within normal limits. Both lungs are clear. The visualized  skeletal structures are unremarkable. IMPRESSION: No active disease. Electronically Signed   By: Minerva Fester M.D.   On: 06/25/2023 18:42    EKG: Independently reviewed. Sinus tachycardia, PVCs, RAD.   Assessment/Plan   1. Alcohol withdrawal; severe alcohol dependence  - Continue CIWA scoring, treat with Ativan, continue adjunctive Precedex infusion for now, supplement vitamins, consult TOC    2. Acute hypoxic respiratory failure  - Patient has new 5 Lpm supplemental O2 requirement but not in respiratory distress  - Lungs appear clear on CXR  - Suspect he may have aspirated, plan to keep NPO for now, repeat CXR in am, continue supplemental O2 as needed    DVT prophylaxis: Lovenox  Code Status: Full  Level of Care: Level of care: ICU Family Communication: None present   Disposition Plan:  Patient is from: Home  Anticipated d/c is to:  TBD Anticipated d/c date is: 06/29/23  Patient currently: Pending treatment of alcohol withdrawal  Consults called: None  Admission status: Inpatient     Briscoe Deutscher, MD [facility]  06/25/2023, 10:22 PM

## 2023-06-26 ENCOUNTER — Inpatient Hospital Stay (HOSPITAL_COMMUNITY): Payer: BLUE CROSS/BLUE SHIELD

## 2023-06-26 DIAGNOSIS — F10231 Alcohol dependence with withdrawal delirium: Secondary | ICD-10-CM | POA: Diagnosis not present

## 2023-06-26 DIAGNOSIS — F10931 Alcohol use, unspecified with withdrawal delirium: Secondary | ICD-10-CM

## 2023-06-26 DIAGNOSIS — J9601 Acute respiratory failure with hypoxia: Secondary | ICD-10-CM | POA: Diagnosis present

## 2023-06-26 DIAGNOSIS — F10932 Alcohol use, unspecified with withdrawal with perceptual disturbance: Secondary | ICD-10-CM | POA: Diagnosis not present

## 2023-06-26 LAB — COMPREHENSIVE METABOLIC PANEL
ALT: 41 U/L (ref 0–44)
AST: 42 U/L — ABNORMAL HIGH (ref 15–41)
Albumin: 3.7 g/dL (ref 3.5–5.0)
Alkaline Phosphatase: 41 U/L (ref 38–126)
Anion gap: 15 (ref 5–15)
BUN: 18 mg/dL (ref 6–20)
CO2: 23 mmol/L (ref 22–32)
Calcium: 8.4 mg/dL — ABNORMAL LOW (ref 8.9–10.3)
Chloride: 103 mmol/L (ref 98–111)
Creatinine, Ser: 0.99 mg/dL (ref 0.61–1.24)
GFR, Estimated: >60 mL/min (ref >60)
Glucose, Bld: 93 mg/dL (ref 70–99)
Potassium: 4.1 mmol/L (ref 3.5–5.1)
Sodium: 141 mmol/L (ref 135–145)
Total Bilirubin: 0.9 mg/dL (ref 0.0–1.2)
Total Protein: 6.3 g/dL — ABNORMAL LOW (ref 6.5–8.1)

## 2023-06-26 LAB — CBC
HCT: 44.8 % (ref 39.0–52.0)
Hemoglobin: 15 g/dL (ref 13.0–17.0)
MCH: 32 pg (ref 26.0–34.0)
MCHC: 33.5 g/dL (ref 30.0–36.0)
MCV: 95.5 fL (ref 80.0–100.0)
Platelets: 184 10*3/uL (ref 150–400)
RBC: 4.69 MIL/uL (ref 4.22–5.81)
RDW: 12.7 % (ref 11.5–15.5)
WBC: 7.3 10*3/uL (ref 4.0–10.5)
nRBC: 0 % (ref 0.0–0.2)

## 2023-06-26 LAB — PHOSPHORUS: Phosphorus: 3.2 mg/dL (ref 2.5–4.6)

## 2023-06-26 LAB — MRSA NEXT GEN BY PCR, NASAL: MRSA by PCR Next Gen: NOT DETECTED

## 2023-06-26 LAB — MAGNESIUM: Magnesium: 1.7 mg/dL (ref 1.7–2.4)

## 2023-06-26 MED ORDER — PHENOBARBITAL SODIUM 130 MG/ML IJ SOLN
130.0000 mg | Freq: Once | INTRAMUSCULAR | Status: AC
  Administered 2023-06-26: 130 mg via INTRAVENOUS
  Filled 2023-06-26: qty 1

## 2023-06-26 MED ORDER — HYDRALAZINE HCL 20 MG/ML IJ SOLN
5.0000 mg | Freq: Four times a day (QID) | INTRAMUSCULAR | Status: AC | PRN
Start: 2023-06-26 — End: 2023-06-27
  Administered 2023-06-26 – 2023-06-27 (×2): 5 mg via INTRAVENOUS
  Filled 2023-06-26 (×2): qty 1

## 2023-06-26 MED ORDER — PHENOBARBITAL 32.4 MG PO TABS
64.8000 mg | ORAL_TABLET | Freq: Three times a day (TID) | ORAL | Status: DC
  Administered 2023-06-26 – 2023-06-27 (×3): 64.8 mg via ORAL
  Filled 2023-06-26 (×3): qty 2

## 2023-06-26 MED ORDER — LACTATED RINGERS IV SOLN: INTRAVENOUS | Status: AC

## 2023-06-26 MED ORDER — PHENOBARBITAL 32.4 MG PO TABS: 32.4000 mg | ORAL_TABLET | Freq: Three times a day (TID) | ORAL | Status: DC

## 2023-06-26 NOTE — Consult Note (Signed)
NAME:  Jermaine [name], MRN:  [id number], DOB:  [birth year], LOS: 1 ADMISSION DATE:  06/25/2023, CONSULTATION DATE:  06/26/23 REFERRING MD:  Jarvis Newcomer CHIEF COMPLAINT:  EtOH Withdrawal   History of Present Illness:  Jermaine [name] is a 58 y.o. male who has a PMH as below including but not limited to anxiety, depression, EtOH dependence. He presented to urgent care 1/28 with confusion, tremors, anxiety, presyncopal symptoms. He was found to have tachycardia, tachypnea, hypoxia and was subsequently told to come to the ED for further evaluaiton.  In ED, he had persistent symptoms. He informed staff that his last drink was earlier that day. He normally drinks 1 - 2 pints Titos every day or every other day along with 2 - 4 White Claws. On day of presentation, he believes he only drank [employer] and no liquor.  He was found to have ethanol of 283 and despite Ativan and Versed, continued to have symptoms. He was subsequently admitted by [facility] for EtOH withdrawal and was started on Precedex and Ativan per CIWA. Head CT and UDS negative. He denies any recreational drug use. He has had withdrawal in the past requiring hospitalization. Appears just admitted to this facility 05/20/23 through 05/24/23 for the same as well as suicidal/homicidal ideation. He was IVC'd at the time and was seen by psych who cleared him for discharge. Per chart review, he has had multiple admissions for the same with 5 admissions in the last 6 months.  On PM 1/29, he remained on 0.4 Precedex; therefore, PCCM asked to assist with his care. He is A&O x 3, no deficits. He had some hallucinations this AM but currently is lucid and appropriately interactive in conversation. He tells me that he has a job interview tomorrow (Thursday 1/30) which he would really like to not miss. I informed him that he would not be discharged by then and recommended he call the employer and request a delay in his interview.  Pertinent  Medical History:  has Alcohol  abuse with intoxication (HCC); Alcohol abuse; Hepatic steatosis; Anxiety and depression; Sinus tachycardia; Class 1 obesity; Secondary polycythemia; Alcohol withdrawal (HCC); Facial fracture due to fall [facility]); Orbital floor (blow-out) closed fracture (HCC); Alcohol-induced mood disorder (HCC); C. difficile colitis; and Acute respiratory failure with hypoxia (HCC) on their problem list.  Significant Hospital Events: Including procedures, antibiotic start and stop dates in addition to other pertinent events   1/28 admit 1/29 PCCM consult  Interim History / Subjective:  On 2L O2 due to desaturations earlier this AM while sleeping. Per RN, he had hallucinations earlier today but currently appropriate and lucid.  Objective:  Blood pressure (!) 156/101, pulse 75, temperature 97.8 F (36.6 C), temperature source Oral, resp. rate 15, height 5\' 10"  (1.778 m), weight 90.7 kg, SpO2 99%.        Intake/Output Summary (Last 24 hours) at 06/26/2023 1531 Last data filed at 06/26/2023 1448 Gross per 24 hour  Intake 2579.45 ml  Output 800 ml  Net 1779.45 ml   Filed Weights   06/25/23 1806  Weight: 90.7 kg    Examination: General: Adult male, resting in bed, in NAD. Does have mild tremors. Neuro: A&O x 3, no deficits. Very mild asterixis. HEENT: [place]/AT. Sclerae anicteric. EOMI. Cardiovascular: RRR, no M/R/G.  Lungs: Respirations even and unlabored.  CTA bilaterally, No W/R/R. Abdomen: BS x 4, soft, NT/ND.  Musculoskeletal: No gross deformities, no edema.  Skin: Intact, warm, no rashes.  Labs/imaging personally reviewed:  CT head 1/29 >  neg.  Assessment & Plan:   Acute alcohol withdrawal. Acute metabolic encephalopathy - 2/2 above. Resolving gradually. - Continue Precedex and wean as able. - Start Phenobarb 130mg  IV x 1 then start PO taper, appreciate pharmacy assistance. - Continue Ativan per CIWA. - Continue Thiamine, Folate.  Acute hypoxic respiratory failure - seems to be improving. ?  Related to somnolence earlier in the day. Query undiagnosed OSA? - Continue supplemental O2 as needed to maintain SpO2 > 92%. - Mobilize. - Encourage outpatient follow up.  Rest per primary team.   Best practice (evaluated daily):  Diet/type: Regular consistency (see orders) DVT prophylaxis: LMWH Pressure ulcer(s): pressure ulcer assessment deferred  GI prophylaxis: N/A Lines: N/A Foley:  N/A Code Status:  full code Last date of multidisciplinary goals of care discussion: None yet.  Labs   CBC: Recent Labs  Lab 06/25/23 1813 06/26/23 0302  WBC 9.7 7.3  HGB 17.2* 15.0  HCT 48.7 44.8  MCV 91.9 95.5  PLT 241 184    Basic Metabolic Panel: Recent Labs  Lab 06/25/23 1813 06/26/23 0302  NA 139 141  K 3.5 4.1  CL 98 103  CO2 25 23  GLUCOSE 90 93  BUN 18 18  CREATININE 1.05 0.99  CALCIUM 8.7* 8.4*  MG  --  1.7  PHOS  --  3.2   GFR: Estimated Creatinine Clearance: 93.3 mL/min (by C-G formula based on SCr of 0.99 mg/dL). Recent Labs  Lab 06/25/23 1813 06/26/23 0302  WBC 9.7 7.3    Liver Function Tests: Recent Labs  Lab 06/25/23 1813 06/26/23 0302  AST 50* 42*  ALT 50* 41  ALKPHOS 60 41  BILITOT 0.7 0.9  PROT 7.5 6.3*  ALBUMIN 4.4 3.7   No results for input(s): "LIPASE", "AMYLASE" in the last 168 hours. No results for input(s): "AMMONIA" in the last 168 hours.  ABG    Component Value Date/Time   PHART 7.4 01/18/2023 0950   PCO2ART 38 01/18/2023 0950   PO2ART 96 01/18/2023 0950   HCO3 23.5 01/18/2023 0950   ACIDBASEDEF 1.1 01/18/2023 0950   O2SAT 99.5 01/18/2023 0950     Coagulation Profile: No results for input(s): "INR", "PROTIME" in the last 168 hours.  Cardiac Enzymes: No results for input(s): "CKTOTAL", "CKMB", "CKMBINDEX", "TROPONINI" in the last 168 hours.  HbA1C: Hgb A1c MFr Bld  Date/Time Value Ref Range Status  01/16/2023 02:57 AM 5.4 4.8 - 5.6 % Final    Comment:    (NOTE) Pre diabetes:          5.7%-6.4%  Diabetes:               >6.4%  Glycemic control for   <7.0% adults with diabetes   03/20/2019 03:46 AM 5.4 4.8 - 5.6 % Final    Comment:    (NOTE) Pre diabetes:          5.7%-6.4% Diabetes:              >6.4% Glycemic control for   <7.0% adults with diabetes     CBG: Recent Labs  Lab 06/25/23 1829  GLUCAP 86    Review of Systems:   All negative; except for those that are bolded, which indicate positives.  Constitutional: weight loss, weight gain, night sweats, fevers, chills, fatigue, weakness.  HEENT: headaches, sore throat, sneezing, nasal congestion, post nasal drip, difficulty swallowing, tooth/dental problems, visual complaints, visual changes, ear aches. Neuro: difficulty with speech, weakness, numbness, ataxia. CV:  chest pain, orthopnea, PND, swelling  in lower extremities, dizziness, palpitations, syncope.  Resp: cough, hemoptysis, dyspnea, wheezing. GI: heartburn, indigestion, abdominal pain, nausea, vomiting, diarrhea, constipation, change in bowel habits, loss of appetite, hematemesis, melena, hematochezia.  GU: dysuria, change in color of urine, urgency or frequency, flank pain, hematuria. MSK: joint pain or swelling, decreased range of motion. Psych: change in mood or affect, depression, anxiety, suicidal ideations, homicidal ideations. Skin: rash, itching, bruising.   Past Medical History:  He,  has a past medical history of Alcohol abuse, Anxiety, Class 1 obesity (09/14/2021), and Depression.   Surgical History:   Past Surgical History:  Procedure Laterality Date   HERNIA REPAIR       Social History:   reports that he has never smoked. He has never been exposed to tobacco smoke. He has never used smokeless tobacco. He reports current alcohol use of about 20.0 standard drinks of alcohol per week. He reports that he does not currently use drugs.   Family History:  His family history is not on file. He was adopted.   Allergies Allergies  Allergen Reactions   Ketamine      Hx of causing fever in 2024    Codeine Itching     Home Medications  Prior to Admission medications   Medication Sig Start Date End Date Taking? Authorizing Provider  hydrOXYzine (ATARAX) 25 MG tablet Take 50 mg by mouth every 8 (eight) hours as needed for anxiety. 06/10/23  Yes [provider]  acamprosate (CAMPRAL) 333 MG tablet Take 666 mg by mouth 3 (three) times daily with meals. Patient not taking: Reported on 06/25/2023    [provider]  escitalopram (LEXAPRO) 10 MG tablet Take 1 tablet (10 mg total) by mouth daily for 7 days. Patient not taking: Reported on 06/25/2023 05/24/23 05/31/23  Hughie Closs, MD  folic acid (FOLVITE) 1 MG tablet Take 1 tablet (1 mg total) by mouth daily. Patient not taking: Reported on 05/21/2023 01/28/23   Alberteen Sam, MD  mirtazapine (REMERON) 15 MG tablet Take 1 tablet (15 mg total) by mouth at bedtime. Patient not taking: Reported on 06/25/2023 05/24/23 06/23/23  Hughie Closs, MD  mirtazapine (REMERON) 30 MG tablet Take 30 mg by mouth at bedtime. Patient not taking: Reported on 06/25/2023    [provider]  thiamine (VITAMIN B-1) 100 MG tablet Take 1 tablet (100 mg total) by mouth daily. Patient not taking: Reported on 06/25/2023 05/20/22   [name], MD     Critical care time: 30 min.   Rutherford Guys, PA - C [facility] For pager details, please see AMION or use Epic chat  After 1900, please call [facility] for cross coverage needs 06/26/2023, 3:31 PM

## 2023-06-26 NOTE — Progress Notes (Signed)
[facility] PROGRESS NOTE  Jermaine [name] (DOB: [birth year]) YSA:[id number] PCP: Farris Has, MD  Brief Narrative: Jermaine [name] is a 58 y.o. male with a history of anxiety, depression, alcohol dependence and history of complicated withdrawal who presented to the ED on 06/25/2023 with confusion, tremors, anxiety and presyncopal symptoms. Sent from urgent care, he was tachycardic, tachypneic with hypoxemia, negative CXR. Reported last drink was 1/28 PM and EtOH level was 283. Ativan and versed given in the ED, appeared to be withdrawing severely, so admitted to ICU on precedex. Still requiring this and supplemental oxygen.   Subjective: Wasn't sure where he was, reports he drinks Tito's about a pint a day. Primary complaint is wanting water. Denies dyspnea or pain any one place. Was hoping to get to a job interview on Friday.  Objective: BP (!) 156/101   Pulse 75   Temp 97.8 F (36.6 C) (Oral)   Resp 15   Ht 5\' 10"  (1.778 m)   Wt 90.7 kg   SpO2 99%   BMI 28.70 kg/m   Gen: Unwell appearing male not in distress Pulm: Diminished without crackles or wheezes  CV: Regular tachycardia this AM, no MRG. No significant pitting edema GI: Soft, NT, slightly distended, +BS Neuro: Sleeping soundly and snoring but rousable and interactive during encounter. Has diffuse trembling no frank asterixis. Ext: Warm, no deformities, Skin: No rashes, lesions or ulcers on visualized skin   Assessment & Plan: Alcohol withdrawal: We are now ~24 hours from last drink, still requiring ativan per CIWA and precedex.  - Continue ativan q1h prn per CIWA - Continue precedex, only requiring 0.3 but not hopeful we'll wean this off, may start phenobarbital taper. - Remain in ICU, consult PCCM for further advice  - Level of alertness this AM sufficient for safe po, so can give clear liquids when alert and remaining upright. Continue IVF for now.  - Vitamin supplementation  Acute hypoxic respiratory failure: Clear  CXR.  - Continue to supplement oxygen to maintain normal respiratory effort and SpO2 >89%. Suspect hypoventilation primarily, +/- aspiration pneumonitis. Will encourage IS.   [name], MD [facility] [email or web address] 06/26/2023, 3:02 PM

## 2023-06-26 NOTE — Plan of Care (Signed)
  Problem: Coping: Goal: Level of anxiety will decrease Outcome: Not Progressing   Problem: Pain Managment: Goal: General experience of comfort will improve and/or be controlled Outcome: Not Progressing   Problem: Skin Integrity: Goal: Risk for impaired skin integrity will decrease Outcome: Not Progressing

## 2023-06-27 DIAGNOSIS — J9601 Acute respiratory failure with hypoxia: Secondary | ICD-10-CM | POA: Diagnosis not present

## 2023-06-27 DIAGNOSIS — F10931 Alcohol use, unspecified with withdrawal delirium: Secondary | ICD-10-CM | POA: Diagnosis not present

## 2023-06-27 LAB — COMPREHENSIVE METABOLIC PANEL
ALT: 37 U/L (ref 0–44)
AST: 39 U/L (ref 15–41)
Albumin: 3.5 g/dL (ref 3.5–5.0)
Alkaline Phosphatase: 36 U/L — ABNORMAL LOW (ref 38–126)
Anion gap: 9 (ref 5–15)
BUN: 11 mg/dL (ref 6–20)
CO2: 25 mmol/L (ref 22–32)
Calcium: 8.5 mg/dL — ABNORMAL LOW (ref 8.9–10.3)
Chloride: 101 mmol/L (ref 98–111)
Creatinine, Ser: 0.76 mg/dL (ref 0.61–1.24)
GFR, Estimated: >60 mL/min (ref >60)
Glucose, Bld: 112 mg/dL — ABNORMAL HIGH (ref 70–99)
Potassium: 3.6 mmol/L (ref 3.5–5.1)
Sodium: 135 mmol/L (ref 135–145)
Total Bilirubin: 1.3 mg/dL — ABNORMAL HIGH (ref 0.0–1.2)
Total Protein: 6.1 g/dL — ABNORMAL LOW (ref 6.5–8.1)

## 2023-06-27 LAB — CBC
HCT: 46.2 % (ref 39.0–52.0)
Hemoglobin: 15.6 g/dL (ref 13.0–17.0)
MCH: 32 pg (ref 26.0–34.0)
MCHC: 33.8 g/dL (ref 30.0–36.0)
MCV: 94.7 fL (ref 80.0–100.0)
Platelets: 161 10*3/uL (ref 150–400)
RBC: 4.88 MIL/uL (ref 4.22–5.81)
RDW: 12.1 % (ref 11.5–15.5)
WBC: 6.3 10*3/uL (ref 4.0–10.5)
nRBC: 0 % (ref 0.0–0.2)

## 2023-06-27 LAB — MAGNESIUM: Magnesium: 2.1 mg/dL (ref 1.7–2.4)

## 2023-06-27 MED ORDER — PHENOBARBITAL SODIUM 65 MG/ML IJ SOLN: 65.0000 mg | Freq: Three times a day (TID) | INTRAMUSCULAR | Status: DC

## 2023-06-27 MED ORDER — PHENOBARBITAL SODIUM 130 MG/ML IJ SOLN
130.0000 mg | Freq: Once | INTRAMUSCULAR | Status: AC
  Administered 2023-06-27: 130 mg via INTRAVENOUS
  Filled 2023-06-27: qty 1

## 2023-06-27 MED ORDER — OLANZAPINE 10 MG IM SOLR
INTRAMUSCULAR | Status: AC
  Filled 2023-06-27: qty 10

## 2023-06-27 MED ORDER — PHENOBARBITAL SODIUM 65 MG/ML IJ SOLN: 32.5000 mg | Freq: Three times a day (TID) | INTRAMUSCULAR | Status: DC

## 2023-06-27 MED ORDER — LACTATED RINGERS IV SOLN: INTRAVENOUS | Status: AC

## 2023-06-27 MED ORDER — LORAZEPAM 2 MG/ML IJ SOLN
1.0000 mg | INTRAMUSCULAR | Status: DC | PRN
  Administered 2023-06-27 – 2023-07-01 (×22): 2 mg via INTRAVENOUS
  Filled 2023-06-27 (×22): qty 1

## 2023-06-27 MED ORDER — OLANZAPINE 10 MG IM SOLR
10.0000 mg | Freq: Once | INTRAMUSCULAR | Status: AC
  Administered 2023-06-27: 10 mg via INTRAMUSCULAR
  Filled 2023-06-27: qty 10

## 2023-06-27 MED ORDER — PHENOBARBITAL SODIUM 130 MG/ML IJ SOLN
97.5000 mg | Freq: Three times a day (TID) | INTRAMUSCULAR | Status: DC
  Administered 2023-06-27 (×2): 97.5 mg via INTRAVENOUS
  Filled 2023-06-27 (×3): qty 1

## 2023-06-27 MED ORDER — LORAZEPAM 2 MG/ML IJ SOLN: 1.0000 mg | INTRAMUSCULAR | Status: DC | PRN

## 2023-06-27 MED ORDER — ORAL CARE MOUTH RINSE
15.0000 mL | OROMUCOSAL | Status: DC | PRN
  Administered 2023-06-29 – 2023-07-01 (×3): 15 mL via OROMUCOSAL

## 2023-06-27 NOTE — Progress Notes (Signed)
NAME:  Jermaine [name], MRN:  [id number], DOB:  1965/09/25, LOS: 2 ADMISSION DATE:  06/25/2023, CONSULTATION DATE:  06/26/23 REFERRING MD:  Jarvis Newcomer CHIEF COMPLAINT:  EtOH Withdrawal   History of Present Illness:  Jermaine [name] is a 58 y.o. male who has a PMH as below including but not limited to anxiety, depression, EtOH dependence. He presented to urgent care 1/28 with confusion, tremors, anxiety, presyncopal symptoms. He was found to have tachycardia, tachypnea, hypoxia and was subsequently told to come to the ED for further evaluaiton.  In ED, he had persistent symptoms. He informed staff that his last drink was earlier that day. He normally drinks 1 - 2 pints Titos every day or every other day along with 2 - 4 White Claws. On day of presentation, he believes he only drank [employer] and no liquor.  He was found to have ethanol of 283 and despite Ativan and Versed, continued to have symptoms. He was subsequently admitted by [facility] for EtOH withdrawal and was started on Precedex and Ativan per CIWA. Head CT and UDS negative. He denies any recreational drug use. He has had withdrawal in the past requiring hospitalization. Appears just admitted to this facility 05/20/23 through 05/24/23 for the same as well as suicidal/homicidal ideation. He was IVC'd at the time and was seen by psych who cleared him for discharge. Per chart review, he has had multiple admissions for the same with 5 admissions in the last 6 months.  On PM 1/29, he remained on 0.4 Precedex; therefore, PCCM asked to assist with his care. He is A&O x 3, no deficits. He had some hallucinations this AM but currently is lucid and appropriately interactive in conversation. He tells me that he has a job interview tomorrow (Thursday 1/30) which he would really like to not miss. I informed him that he would not be discharged by then and recommended he call the employer and request a delay in his interview.  Pertinent  Medical History:  has Alcohol  abuse with intoxication (HCC); Alcohol abuse; Hepatic steatosis; Anxiety and depression; Sinus tachycardia; Class 1 obesity; Secondary polycythemia; DTs (delirium tremens) (HCC); Alcohol withdrawal (HCC); Facial fracture due to fall [facility]); Orbital floor (blow-out) closed fracture (HCC); Alcohol-induced mood disorder (HCC); C. difficile colitis; and Acute hypoxic respiratory failure (HCC) on their problem list.  Significant Hospital Events: Including procedures, antibiotic start and stop dates in addition to other pertinent events   1/28 admit 1/29 PCCM consult  Interim History / Subjective:  Unable to wean precedex thus far  Objective:  Blood pressure 136/61, pulse 63, temperature (!) 97.2 F (36.2 C), temperature source Axillary, resp. rate (!) 25, height 5\' 10"  (1.778 m), weight 90.7 kg, SpO2 93%.        Intake/Output Summary (Last 24 hours) at 06/27/2023 1100 Last data filed at 06/27/2023 1046 Gross per 24 hour  Intake 2766.18 ml  Output 2200 ml  Net 566.18 ml   Filed Weights   06/25/23 1806  Weight: 90.7 kg    Examination: Dex 0.8 and s/p IV phenobarb 130 General:  Adult male sitting upright in bed in NAD HEENT: MM pink/dry, pupils 3/r, anicteric Neuro: Awakens to touch/ verbal at times, otherwise somnolent, garbled speech, will intermittently follow simple commands in all extremities but  CV: rr, no murmur PULM:  non labored, clear anteriorly, diminished in bases GI: soft, bs+, NT, voids Extremities: warm/dry, no LE edema  Skin: no rashes   Labs/ imaging reviewed>   Labs/imaging personally reviewed:  CT head 1/29 > neg.  Assessment & Plan:   Acute alcohol withdrawal Acute metabolic encephalopathy - 2/2 above - will assume primary care while on Precedex - cont to wean precedex as able for RASS goal 0/-1 - phenobarb taper adjusted as was needing more precedex/ ativan.  Consider additional boluses prn to wean off precedex - prn ativan severe agitation/ seizure -  NPO till mental status more stable - seizure precautions - IV thiamine/ folic, MVI when taking PO - LR at 100 ml/hr while NPO - Maintain neuro protective measures; goal for eurothermia, euglycemia, eunatermia, normoxia, and PCO2 goal of 35-40 - bowel regiment prn, monitor for urinary retention   Acute hypoxic respiratory failure - seems to be improving - CXR neg on admit.  Remains afebrile, normal WBC - Query undiagnosed OSA? Consider outpt f/u - aspiration precautions - prn supplemental O2 as needed to maintain SpO2 > 92%, currently on RA - Mobilize when able, IS - TOC pending    Best practice (evaluated daily):  Diet/type: NPO DVT prophylaxis: LMWH Pressure ulcer(s): pressure ulcer assessment deferred  GI prophylaxis: N/A Lines: N/A Foley:  N/A Code Status:  full code Last date of multidisciplinary goals of care discussion: pending 1/30  Labs   CBC: Recent Labs  Lab 06/25/23 1813 06/26/23 0302 06/27/23 0323  WBC 9.7 7.3 6.3  HGB 17.2* 15.0 15.6  HCT 48.7 44.8 46.2  MCV 91.9 95.5 94.7  PLT 241 184 161    Basic Metabolic Panel: Recent Labs  Lab 06/25/23 1813 06/26/23 0302 06/27/23 0323  NA 139 141 135  K 3.5 4.1 3.6  CL 98 103 101  CO2 25 23 25   GLUCOSE 90 93 112*  BUN 18 18 11   CREATININE 1.05 0.99 0.76  CALCIUM 8.7* 8.4* 8.5*  MG  --  1.7  --   PHOS  --  3.2  --    GFR: Estimated Creatinine Clearance: 115.4 mL/min (by C-G formula based on SCr of 0.76 mg/dL). Recent Labs  Lab 06/25/23 1813 06/26/23 0302 06/27/23 0323  WBC 9.7 7.3 6.3    Liver Function Tests: Recent Labs  Lab 06/25/23 1813 06/26/23 0302 06/27/23 0323  AST 50* 42* 39  ALT 50* 41 37  ALKPHOS 60 41 36*  BILITOT 0.7 0.9 1.3*  PROT 7.5 6.3* 6.1*  ALBUMIN 4.4 3.7 3.5   No results for input(s): "LIPASE", "AMYLASE" in the last 168 hours. No results for input(s): "AMMONIA" in the last 168 hours.  ABG    Component Value Date/Time   PHART 7.4 01/18/2023 0950   PCO2ART 38  01/18/2023 0950   PO2ART 96 01/18/2023 0950   HCO3 23.5 01/18/2023 0950   ACIDBASEDEF 1.1 01/18/2023 0950   O2SAT 99.5 01/18/2023 0950     Coagulation Profile: No results for input(s): "INR", "PROTIME" in the last 168 hours.  Cardiac Enzymes: No results for input(s): "CKTOTAL", "CKMB", "CKMBINDEX", "TROPONINI" in the last 168 hours.  HbA1C: Hgb A1c MFr Bld  Date/Time Value Ref Range Status  01/16/2023 02:57 AM 5.4 4.8 - 5.6 % Final    Comment:    (NOTE) Pre diabetes:          5.7%-6.4%  Diabetes:              >6.4%  Glycemic control for   <7.0% adults with diabetes   03/20/2019 03:46 AM 5.4 4.8 - 5.6 % Final    Comment:    (NOTE) Pre diabetes:  5.7%-6.4% Diabetes:              >6.4% Glycemic control for   <7.0% adults with diabetes     CBG: Recent Labs  Lab 06/25/23 1829  GLUCAP 86    Past Medical History:  He,  has a past medical history of Alcohol abuse, Anxiety, Class 1 obesity (09/14/2021), and Depression.   Surgical History:   Past Surgical History:  Procedure Laterality Date   HERNIA REPAIR       Social History:   reports that he has never smoked. He has never been exposed to tobacco smoke. He has never used smokeless tobacco. He reports current alcohol use of about 20.0 standard drinks of alcohol per week. He reports that he does not currently use drugs.   Family History:  His family history is not on file. He was adopted.   Allergies Allergies  Allergen Reactions   Ketamine     Hx of causing fever in 2024    Codeine Itching     Home Medications  Prior to Admission medications   Medication Sig Start Date End Date Taking? Authorizing Provider  hydrOXYzine (ATARAX) 25 MG tablet Take 50 mg by mouth every 8 (eight) hours as needed for anxiety. 06/10/23  Yes [provider]  acamprosate (CAMPRAL) 333 MG tablet Take 666 mg by mouth 3 (three) times daily with meals. Patient not taking: Reported on 06/25/2023    [provider]  escitalopram (LEXAPRO) 10 MG tablet Take 1 tablet (10 mg total) by mouth daily for 7 days. Patient not taking: Reported on 06/25/2023 05/24/23 05/31/23  Hughie Closs, MD  folic acid (FOLVITE) 1 MG tablet Take 1 tablet (1 mg total) by mouth daily. Patient not taking: Reported on 05/21/2023 01/28/23   Alberteen Sam, MD  mirtazapine (REMERON) 15 MG tablet Take 1 tablet (15 mg total) by mouth at bedtime. Patient not taking: Reported on 06/25/2023 05/24/23 06/23/23  Hughie Closs, MD  mirtazapine (REMERON) 30 MG tablet Take 30 mg by mouth at bedtime. Patient not taking: Reported on 06/25/2023    [provider]  thiamine (VITAMIN B-1) 100 MG tablet Take 1 tablet (100 mg total) by mouth daily. Patient not taking: Reported on 06/25/2023 05/20/22   [name], MD     Critical care time: 35 mins      Posey Boyer, MSN, AG-ACNP-BC [facility] 06/27/2023, 11:00 AM  See Amion for pager If no response to pager , please call [phone] until 7pm After 7:00 pm call Elink  [phone]

## 2023-06-27 NOTE — Plan of Care (Signed)
  Problem: Education: Goal: Knowledge of General Education information will improve Description: Including pain rating scale, medication(s)/side effects and non-pharmacologic comfort measures Outcome: Progressing   Problem: Clinical Measurements: Goal: Ability to maintain clinical measurements within normal limits will improve Outcome: Progressing Goal: Will remain free from infection Outcome: Progressing Goal: Diagnostic test results will improve Outcome: Progressing Goal: Respiratory complications will improve Outcome: Progressing Goal: Cardiovascular complication will be avoided Outcome: Progressing   Problem: Activity: Goal: Risk for activity intolerance will decrease Outcome: Progressing   Problem: Elimination: Goal: Will not experience complications related to urinary retention Outcome: Progressing   Problem: Pain Managment: Goal: General experience of comfort will improve and/or be controlled Outcome: Progressing   Problem: Safety: Goal: Ability to remain free from injury will improve Outcome: Progressing   Problem: Skin Integrity: Goal: Risk for impaired skin integrity will decrease Outcome: Progressing   Problem: Safety: Goal: Non-violent Restraint(s) Outcome: Progressing

## 2023-06-27 NOTE — Plan of Care (Signed)
  Problem: Pain Managment: Goal: General experience of comfort will improve and/or be controlled Outcome: Progressing   Problem: Safety: Goal: Ability to remain free from injury will improve Outcome: Progressing   Problem: Skin Integrity: Goal: Risk for impaired skin integrity will decrease Outcome: Progressing   Problem: Coping: Goal: Level of anxiety will decrease Outcome: Not Progressing

## 2023-06-28 DIAGNOSIS — F10931 Alcohol use, unspecified with withdrawal delirium: Secondary | ICD-10-CM | POA: Diagnosis not present

## 2023-06-28 DIAGNOSIS — J9601 Acute respiratory failure with hypoxia: Secondary | ICD-10-CM | POA: Diagnosis not present

## 2023-06-28 LAB — CBC
HCT: 51.7 % (ref 39.0–52.0)
Hemoglobin: 17.5 g/dL — ABNORMAL HIGH (ref 13.0–17.0)
MCH: 32.1 pg (ref 26.0–34.0)
MCHC: 33.8 g/dL (ref 30.0–36.0)
MCV: 94.9 fL (ref 80.0–100.0)
Platelets: 170 10*3/uL (ref 150–400)
RBC: 5.45 MIL/uL (ref 4.22–5.81)
RDW: 12.2 % (ref 11.5–15.5)
WBC: 8.8 10*3/uL (ref 4.0–10.5)
nRBC: 0 % (ref 0.0–0.2)

## 2023-06-28 LAB — GLUCOSE, CAPILLARY: Glucose-Capillary: 104 mg/dL — ABNORMAL HIGH (ref 70–99)

## 2023-06-28 LAB — COMPREHENSIVE METABOLIC PANEL
ALT: 35 U/L (ref 0–44)
AST: 36 U/L (ref 15–41)
Albumin: 4.1 g/dL (ref 3.5–5.0)
Alkaline Phosphatase: 41 U/L (ref 38–126)
Anion gap: 10 (ref 5–15)
BUN: 10 mg/dL (ref 6–20)
CO2: 25 mmol/L (ref 22–32)
Calcium: 8.8 mg/dL — ABNORMAL LOW (ref 8.9–10.3)
Chloride: 101 mmol/L (ref 98–111)
Creatinine, Ser: 0.87 mg/dL (ref 0.61–1.24)
GFR, Estimated: >60 mL/min (ref >60)
Glucose, Bld: 92 mg/dL (ref 70–99)
Potassium: 3.8 mmol/L (ref 3.5–5.1)
Sodium: 136 mmol/L (ref 135–145)
Total Bilirubin: 0.8 mg/dL (ref 0.0–1.2)
Total Protein: 7 g/dL (ref 6.5–8.1)

## 2023-06-28 LAB — MAGNESIUM: Magnesium: 2 mg/dL (ref 1.7–2.4)

## 2023-06-28 MED ORDER — PHENOBARBITAL SODIUM 65 MG/ML IJ SOLN
65.0000 mg | Freq: Three times a day (TID) | INTRAMUSCULAR | Status: AC
  Administered 2023-06-30 – 2023-07-02 (×6): 65 mg via INTRAVENOUS
  Filled 2023-06-28 (×6): qty 1

## 2023-06-28 MED ORDER — PHENOBARBITAL SODIUM 130 MG/ML IJ SOLN: 130.0000 mg | Freq: Three times a day (TID) | INTRAMUSCULAR | Status: DC

## 2023-06-28 MED ORDER — PHENOBARBITAL SODIUM 65 MG/ML IJ SOLN
32.5000 mg | Freq: Three times a day (TID) | INTRAMUSCULAR | Status: AC
  Administered 2023-07-02 – 2023-07-04 (×6): 32.5 mg via INTRAVENOUS
  Filled 2023-06-28 (×6): qty 1

## 2023-06-28 MED ORDER — PHENOBARBITAL SODIUM 130 MG/ML IJ SOLN
130.0000 mg | Freq: Once | INTRAMUSCULAR | Status: AC
  Administered 2023-06-28: 130 mg via INTRAVENOUS
  Filled 2023-06-28: qty 1

## 2023-06-28 MED ORDER — PHENOBARBITAL SODIUM 130 MG/ML IJ SOLN
97.5000 mg | Freq: Three times a day (TID) | INTRAMUSCULAR | Status: AC
  Administered 2023-06-29 – 2023-06-30 (×3): 97.5 mg via INTRAVENOUS
  Filled 2023-06-28 (×3): qty 1

## 2023-06-28 MED ORDER — CLONIDINE HCL 0.1 MG/24HR TD PTWK
0.1000 mg | MEDICATED_PATCH | TRANSDERMAL | Status: DC
  Administered 2023-06-28: 0.1 mg via TRANSDERMAL
  Filled 2023-06-28: qty 1

## 2023-06-28 MED ORDER — HALOPERIDOL LACTATE 5 MG/ML IJ SOLN
INTRAMUSCULAR | Status: AC
  Filled 2023-06-28: qty 1

## 2023-06-28 MED ORDER — DEXMEDETOMIDINE HCL IN NACL 400 MCG/100ML IV SOLN
0.8000 ug/kg/h | INTRAVENOUS | Status: DC
  Administered 2023-06-28: 1.5 ug/kg/h via INTRAVENOUS
  Administered 2023-06-29: 0.8 ug/kg/h via INTRAVENOUS
  Administered 2023-06-29: 1.5 ug/kg/h via INTRAVENOUS
  Administered 2023-06-29: 0.8 ug/kg/h via INTRAVENOUS
  Administered 2023-06-29: 0.9 ug/kg/h via INTRAVENOUS
  Administered 2023-06-30: 1.8 ug/kg/h via INTRAVENOUS
  Administered 2023-06-30: 1.5 ug/kg/h via INTRAVENOUS
  Administered 2023-06-30: 1.8 ug/kg/h via INTRAVENOUS
  Administered 2023-06-30: 1 ug/kg/h via INTRAVENOUS
  Administered 2023-06-30 – 2023-07-01 (×6): 1.8 ug/kg/h via INTRAVENOUS
  Administered 2023-07-01: 1.7 ug/kg/h via INTRAVENOUS
  Administered 2023-07-01 (×2): 1.8 ug/kg/h via INTRAVENOUS
  Administered 2023-07-01: 1.7 ug/kg/h via INTRAVENOUS
  Administered 2023-07-01: 1.8 ug/kg/h via INTRAVENOUS
  Administered 2023-07-01: 1.7 ug/kg/h via INTRAVENOUS
  Administered 2023-07-01 (×2): 1.8 ug/kg/h via INTRAVENOUS
  Administered 2023-07-02 (×2): 1.7 ug/kg/h via INTRAVENOUS
  Administered 2023-07-02: 1.6 ug/kg/h via INTRAVENOUS
  Administered 2023-07-02: 1.8 ug/kg/h via INTRAVENOUS
  Administered 2023-07-02: 1.5 ug/kg/h via INTRAVENOUS
  Administered 2023-07-02: 1.8 ug/kg/h via INTRAVENOUS
  Administered 2023-07-02: 1.5 ug/kg/h via INTRAVENOUS
  Administered 2023-07-02: 1.7 ug/kg/h via INTRAVENOUS
  Administered 2023-07-03: 1.5 ug/kg/h via INTRAVENOUS
  Administered 2023-07-03 (×2): 1.2 ug/kg/h via INTRAVENOUS
  Administered 2023-07-03: 1.1 ug/kg/h via INTRAVENOUS
  Administered 2023-07-03: 1.2 ug/kg/h via INTRAVENOUS
  Administered 2023-07-03: 1.1 ug/kg/h via INTRAVENOUS
  Administered 2023-07-04: 1.5 ug/kg/h via INTRAVENOUS
  Administered 2023-07-04 (×2): 1.2 ug/kg/h via INTRAVENOUS
  Administered 2023-07-04 – 2023-07-05 (×6): 1.5 ug/kg/h via INTRAVENOUS
  Administered 2023-07-05: 1.1 ug/kg/h via INTRAVENOUS
  Administered 2023-07-05: 1.5 ug/kg/h via INTRAVENOUS
  Administered 2023-07-05: 1.4 ug/kg/h via INTRAVENOUS
  Administered 2023-07-06 (×3): 1 ug/kg/h via INTRAVENOUS
  Administered 2023-07-06 (×2): 1.4 ug/kg/h via INTRAVENOUS
  Administered 2023-07-07 (×3): 0.8 ug/kg/h via INTRAVENOUS
  Administered 2023-07-07: 1 ug/kg/h via INTRAVENOUS
  Administered 2023-07-08: 0.8 ug/kg/h via INTRAVENOUS
  Filled 2023-06-28 (×2): qty 100
  Filled 2023-06-28 (×2): qty 200
  Filled 2023-06-28 (×16): qty 100
  Filled 2023-06-28: qty 200
  Filled 2023-06-28 (×18): qty 100
  Filled 2023-06-28: qty 200
  Filled 2023-06-28 (×17): qty 100

## 2023-06-28 MED ORDER — PHENOBARBITAL SODIUM 130 MG/ML IJ SOLN
130.0000 mg | Freq: Three times a day (TID) | INTRAMUSCULAR | Status: AC
  Administered 2023-06-28 – 2023-06-29 (×3): 130 mg via INTRAVENOUS
  Filled 2023-06-28 (×3): qty 1

## 2023-06-28 MED ORDER — PHENOBARBITAL SODIUM 65 MG/ML IJ SOLN: 65.0000 mg | Freq: Three times a day (TID) | INTRAMUSCULAR | Status: DC

## 2023-06-28 MED ORDER — PHENOBARBITAL SODIUM 130 MG/ML IJ SOLN: 97.5000 mg | Freq: Three times a day (TID) | INTRAMUSCULAR | Status: DC

## 2023-06-28 MED ORDER — HALOPERIDOL LACTATE 5 MG/ML IJ SOLN
5.0000 mg | INTRAMUSCULAR | Status: AC
  Administered 2023-06-28: 5 mg via INTRAVENOUS

## 2023-06-28 MED ORDER — PHENOBARBITAL SODIUM 65 MG/ML IJ SOLN: 32.5000 mg | Freq: Three times a day (TID) | INTRAMUSCULAR | Status: DC

## 2023-06-28 MED ORDER — LACTATED RINGERS IV SOLN: INTRAVENOUS | Status: AC

## 2023-06-28 MED ORDER — OLANZAPINE 10 MG IM SOLR
10.0000 mg | Freq: Once | INTRAMUSCULAR | Status: AC | PRN
  Administered 2023-06-28: 10 mg via INTRAMUSCULAR
  Filled 2023-06-28: qty 10

## 2023-06-28 NOTE — Progress Notes (Signed)
  Continued agitation  Plan - Will try Haldol 5 mg IV x 1 [QTc on EKG yesterday was normal]      SIGNATURE    Dr. Kalman Shan, M.D., F.C.C.P,  Pulmonary and Critical Care Medicine Staff Physician, [facility] Director - Interstitial Lung Disease  Program  Pulmonary Fibrosis [facility] at [facility] [place], [place], [address]   Pager: [phone], If no answer  -> Check AMION or Try [phone] Telephone (clinical office): [phone] Telephone (research): [phone]  6:25 PM 06/28/2023

## 2023-06-28 NOTE — Progress Notes (Addendum)
NAME:  [name], MRN:  [id number], DOB:  27-Apr-1966, LOS: 3 ADMISSION DATE:  06/25/2023, CONSULTATION DATE:  06/26/23 REFERRING MD:  Jarvis Newcomer CHIEF COMPLAINT:  EtOH Withdrawal   History of Present Illness:  Jermaine [name] is a 58 y.o. male who has a PMH as below including but not limited to anxiety, depression, EtOH dependence. He presented to urgent care 1/28 with confusion, tremors, anxiety, presyncopal symptoms. He was found to have tachycardia, tachypnea, hypoxia and was subsequently told to come to the ED for further evaluaiton.  In ED, he had persistent symptoms. He informed staff that his last drink was earlier that day. He normally drinks 1 - 2 pints Titos every day or every other day along with 2 - 4 White Claws. On day of presentation, he believes he only drank [employer] and no liquor.  He was found to have ethanol of 283 and despite Ativan and Versed, continued to have symptoms. He was subsequently admitted by [facility] for EtOH withdrawal and was started on Precedex and Ativan per CIWA. Head CT and UDS negative. He denies any recreational drug use. He has had withdrawal in the past requiring hospitalization. Appears just admitted to this facility 05/20/23 through 05/24/23 for the same as well as suicidal/homicidal ideation. He was IVC'd at the time and was seen by psych who cleared him for discharge. Per chart review, he has had multiple admissions for the same with 5 admissions in the last 6 months.  On PM 1/29, he remained on 0.4 Precedex; therefore, PCCM asked to assist with his care. He is A&O x 3, no deficits. He had some hallucinations this AM but currently is lucid and appropriately interactive in conversation. He tells me that he has a job interview tomorrow (Thursday 1/30) which he would really like to not miss. I informed him that he would not be discharged by then and recommended he call the employer and request a delay in his interview.  Pertinent  Medical History:  has Alcohol  abuse with intoxication (HCC); Alcohol abuse; Hepatic steatosis; Anxiety and depression; Sinus tachycardia; Class 1 obesity; Secondary polycythemia; DTs (delirium tremens) (HCC); Alcohol withdrawal (HCC); Facial fracture due to fall [facility]); Orbital floor (blow-out) closed fracture (HCC); Alcohol-induced mood disorder (HCC); C. difficile colitis; and Acute hypoxic respiratory failure (HCC) on their problem list.  Significant Hospital Events: Including procedures, antibiotic start and stop dates in addition to other pertinent events   1/28 admit 1/29 PCCM consult  Interim History / Subjective:  Unable to wean precedex thus far, up to 1.8 dex this am.  Either sedated or agitated, swinging at staff this am  Objective:  Blood pressure (!) 151/94, pulse 64, temperature 99.2 F (37.3 C), temperature source Axillary, resp. rate (!) 22, height 5\' 10"  (1.778 m), weight 90.7 kg, SpO2 98%.        Intake/Output Summary (Last 24 hours) at 06/28/2023 0720 Last data filed at 06/28/2023 7253 Gross per 24 hour  Intake 2534.8 ml  Output 3300 ml  Net -765.2 ml   Filed Weights   06/25/23 1806  Weight: 90.7 kg    Examination: Dex 1.8 General:  ill appearing older male, sedated lying in bed HEENT: MM pink/ very dry, pupils pinpoint Neuro: wakes up to sternal rub and mumbles, MAE, requiring restraints, easily agitated when awake and combative at times.  Few minutes later, pt yelling without provokation CV: rr, NSR, no murmur PULM:  non labored, deep at times, clear GI: soft, bs+, ND/ NT, purwick Extremities:  warm/dry, no LE edema  Skin: no rashes  Labs/ imaging reviewed UOP 3.3L/ 24hrs  Net +1.7 Tmax 99.2  Labs/imaging personally reviewed:  CT head 1/29 > neg.  Assessment & Plan:   Acute alcohol withdrawal with DTs Acute metabolic encephalopathy - 2/2 above - cont phenobarb 97.5mg  q8hrs and consider additional boluses, holding taper till able to reduce precedex needs - cont to minimize  precedex as able for RASS goal 0/-1 - prn ativan severe agitation/ seizure - NPO till mental status more stable, cont MIVF - seizure precautions - IV thiamine/ folic, MVI when taking PO - Maintain neuro protective measures; goal for eurothermia, euglycemia, eunatermia, normoxia, and PCO2 goal of 35-40 - bowel regiment prn, monitor for urinary retention - monitor infectious symptoms> remains afebrile, normal labs.  Monitor clinically   Acute hypoxic respiratory failure  - CXR neg on admit.  Remains afebrile, normal WBC - Query undiagnosed OSA? Consider outpt f/u P:  - currently on room air, protecting airway - aspiration precautions - prn supplemental O2 as needed to maintain SpO2 > 92%, currently on RA - Mobilize when able, IS - TOC pending    Best practice (evaluated daily):  Diet/type: NPO DVT prophylaxis: LMWH Pressure ulcer(s): pressure ulcer assessment deferred  GI prophylaxis: N/A Lines: N/A Foley:  N/A Code Status:  full code Last date of multidisciplinary goals of care discussion: pending 1/31  Labs   CBC: Recent Labs  Lab 06/25/23 1813 06/26/23 0302 06/27/23 0323 06/28/23 0320  WBC 9.7 7.3 6.3 8.8  HGB 17.2* 15.0 15.6 17.5*  HCT 48.7 44.8 46.2 51.7  MCV 91.9 95.5 94.7 94.9  PLT 241 184 161 170    Basic Metabolic Panel: Recent Labs  Lab 06/25/23 1813 06/26/23 0302 06/27/23 0323 06/28/23 0320  NA 139 141 135 136  K 3.5 4.1 3.6 3.8  CL 98 103 101 101  CO2 25 23 25 25   GLUCOSE 90 93 112* 92  BUN 18 18 11 10   CREATININE 1.05 0.99 0.76 0.87  CALCIUM 8.7* 8.4* 8.5* 8.8*  MG  --  1.7 2.1 2.0  PHOS  --  3.2  --   --    GFR: Estimated Creatinine Clearance: 106.1 mL/min (by C-G formula based on SCr of 0.87 mg/dL). Recent Labs  Lab 06/25/23 1813 06/26/23 0302 06/27/23 0323 06/28/23 0320  WBC 9.7 7.3 6.3 8.8    Liver Function Tests: Recent Labs  Lab 06/25/23 1813 06/26/23 0302 06/27/23 0323 06/28/23 0320  AST 50* 42* 39 36  ALT 50* 41 37  35  ALKPHOS 60 41 36* 41  BILITOT 0.7 0.9 1.3* 0.8  PROT 7.5 6.3* 6.1* 7.0  ALBUMIN 4.4 3.7 3.5 4.1   No results for input(s): "LIPASE", "AMYLASE" in the last 168 hours. No results for input(s): "AMMONIA" in the last 168 hours.  ABG    Component Value Date/Time   PHART 7.4 01/18/2023 0950   PCO2ART 38 01/18/2023 0950   PO2ART 96 01/18/2023 0950   HCO3 23.5 01/18/2023 0950   ACIDBASEDEF 1.1 01/18/2023 0950   O2SAT 99.5 01/18/2023 0950     Coagulation Profile: No results for input(s): "INR", "PROTIME" in the last 168 hours.  Cardiac Enzymes: No results for input(s): "CKTOTAL", "CKMB", "CKMBINDEX", "TROPONINI" in the last 168 hours.  HbA1C: Hgb A1c MFr Bld  Date/Time Value Ref Range Status  01/16/2023 02:57 AM 5.4 4.8 - 5.6 % Final    Comment:    (NOTE) Pre diabetes:  5.7%-6.4%  Diabetes:              >6.4%  Glycemic control for   <7.0% adults with diabetes   03/20/2019 03:46 AM 5.4 4.8 - 5.6 % Final    Comment:    (NOTE) Pre diabetes:          5.7%-6.4% Diabetes:              >6.4% Glycemic control for   <7.0% adults with diabetes     CBG: Recent Labs  Lab 06/25/23 1829  GLUCAP 86    Past Medical History:  He,  has a past medical history of Alcohol abuse, Anxiety, Class 1 obesity (09/14/2021), and Depression.   Surgical History:   Past Surgical History:  Procedure Laterality Date   HERNIA REPAIR       Social History:   reports that he has never smoked. He has never been exposed to tobacco smoke. He has never used smokeless tobacco. He reports current alcohol use of about 20.0 standard drinks of alcohol per week. He reports that he does not currently use drugs.   Family History:  His family history is not on file. He was adopted.   Allergies Allergies  Allergen Reactions   Ketamine     Hx of causing fever in 2024    Codeine Itching     Home Medications  Prior to Admission medications   Medication Sig Start Date End Date Taking?  Authorizing Provider  hydrOXYzine (ATARAX) 25 MG tablet Take 50 mg by mouth every 8 (eight) hours as needed for anxiety. 06/10/23  Yes [provider]  acamprosate (CAMPRAL) 333 MG tablet Take 666 mg by mouth 3 (three) times daily with meals. Patient not taking: Reported on 06/25/2023    [provider]  escitalopram (LEXAPRO) 10 MG tablet Take 1 tablet (10 mg total) by mouth daily for 7 days. Patient not taking: Reported on 06/25/2023 05/24/23 05/31/23  Hughie Closs, MD  folic acid (FOLVITE) 1 MG tablet Take 1 tablet (1 mg total) by mouth daily. Patient not taking: Reported on 05/21/2023 01/28/23   Alberteen Sam, MD  mirtazapine (REMERON) 15 MG tablet Take 1 tablet (15 mg total) by mouth at bedtime. Patient not taking: Reported on 06/25/2023 05/24/23 06/23/23  Hughie Closs, MD  mirtazapine (REMERON) 30 MG tablet Take 30 mg by mouth at bedtime. Patient not taking: Reported on 06/25/2023    [provider]  thiamine (VITAMIN B-1) 100 MG tablet Take 1 tablet (100 mg total) by mouth daily. Patient not taking: Reported on 06/25/2023 05/20/22   [name], MD     Critical care time: 35 mins      Posey Boyer, MSN, AG-ACNP-BC [facility] 06/28/2023, 7:20 AM  See Amion for pager If no response to pager , please call [phone] until 7pm After 7:00 pm call Elink  [phone]

## 2023-06-28 NOTE — Plan of Care (Signed)
  Problem: Clinical Measurements: Goal: Ability to maintain clinical measurements within normal limits will improve Outcome: Progressing Goal: Will remain free from infection Outcome: Progressing Goal: Diagnostic test results will improve Outcome: Progressing Goal: Respiratory complications will improve Outcome: Progressing Goal: Cardiovascular complication will be avoided Outcome: Progressing   Problem: Activity: Goal: Risk for activity intolerance will decrease Outcome: Progressing   Problem: Coping: Goal: Level of anxiety will decrease Outcome: Progressing   Problem: Elimination: Goal: Will not experience complications related to bowel motility Outcome: Progressing Goal: Will not experience complications related to urinary retention Outcome: Progressing   Problem: Pain Managment: Goal: General experience of comfort will improve and/or be controlled Outcome: Progressing   Problem: Safety: Goal: Ability to remain free from injury will improve Outcome: Progressing   Problem: Skin Integrity: Goal: Risk for impaired skin integrity will decrease Outcome: Progressing   Problem: Safety: Goal: Non-violent Restraint(s) Outcome: Progressing

## 2023-06-28 NOTE — Progress Notes (Signed)
Remains agitated despite phenobarb earlier and dex 1.2  - additional phenobarb 130mg  IV now, consider further prn's - SBP 150's, remains NPO, will add clonidine patch 0.1mg       Posey Boyer, MSN, AG-ACNP-BC [facility] 06/28/2023, 3:17 PM  See Amion for pager If no response to pager , please call [phone] until 7pm After 7:00 pm call Elink  [phone]

## 2023-06-29 DIAGNOSIS — R509 Fever, unspecified: Secondary | ICD-10-CM | POA: Diagnosis not present

## 2023-06-29 DIAGNOSIS — Z79899 Other long term (current) drug therapy: Secondary | ICD-10-CM | POA: Diagnosis not present

## 2023-06-29 DIAGNOSIS — F10932 Alcohol use, unspecified with withdrawal with perceptual disturbance: Secondary | ICD-10-CM | POA: Diagnosis not present

## 2023-06-29 DIAGNOSIS — R1310 Dysphagia, unspecified: Secondary | ICD-10-CM | POA: Diagnosis not present

## 2023-06-29 DIAGNOSIS — I959 Hypotension, unspecified: Secondary | ICD-10-CM | POA: Diagnosis not present

## 2023-06-29 DIAGNOSIS — R5381 Other malaise: Secondary | ICD-10-CM | POA: Diagnosis present

## 2023-06-29 DIAGNOSIS — F10229 Alcohol dependence with intoxication, unspecified: Secondary | ICD-10-CM | POA: Diagnosis present

## 2023-06-29 DIAGNOSIS — R569 Unspecified convulsions: Secondary | ICD-10-CM | POA: Diagnosis not present

## 2023-06-29 DIAGNOSIS — K760 Fatty (change of) liver, not elsewhere classified: Secondary | ICD-10-CM | POA: Diagnosis present

## 2023-06-29 DIAGNOSIS — R296 Repeated falls: Secondary | ICD-10-CM | POA: Diagnosis present

## 2023-06-29 DIAGNOSIS — D72829 Elevated white blood cell count, unspecified: Secondary | ICD-10-CM | POA: Diagnosis present

## 2023-06-29 DIAGNOSIS — E861 Hypovolemia: Secondary | ICD-10-CM | POA: Diagnosis present

## 2023-06-29 DIAGNOSIS — Z1152 Encounter for screening for COVID-19: Secondary | ICD-10-CM | POA: Diagnosis not present

## 2023-06-29 DIAGNOSIS — Z781 Physical restraint status: Secondary | ICD-10-CM | POA: Diagnosis not present

## 2023-06-29 DIAGNOSIS — E876 Hypokalemia: Secondary | ICD-10-CM | POA: Diagnosis present

## 2023-06-29 DIAGNOSIS — I10 Essential (primary) hypertension: Secondary | ICD-10-CM | POA: Diagnosis present

## 2023-06-29 DIAGNOSIS — Z885 Allergy status to narcotic agent status: Secondary | ICD-10-CM | POA: Diagnosis not present

## 2023-06-29 DIAGNOSIS — G4089 Other seizures: Secondary | ICD-10-CM | POA: Diagnosis present

## 2023-06-29 DIAGNOSIS — R451 Restlessness and agitation: Secondary | ICD-10-CM | POA: Diagnosis not present

## 2023-06-29 DIAGNOSIS — F10931 Alcohol use, unspecified with withdrawal delirium: Secondary | ICD-10-CM | POA: Diagnosis not present

## 2023-06-29 DIAGNOSIS — F05 Delirium due to known physiological condition: Secondary | ICD-10-CM | POA: Diagnosis not present

## 2023-06-29 DIAGNOSIS — G9341 Metabolic encephalopathy: Secondary | ICD-10-CM | POA: Diagnosis present

## 2023-06-29 DIAGNOSIS — J9601 Acute respiratory failure with hypoxia: Secondary | ICD-10-CM | POA: Diagnosis present

## 2023-06-29 DIAGNOSIS — K5900 Constipation, unspecified: Secondary | ICD-10-CM | POA: Diagnosis present

## 2023-06-29 DIAGNOSIS — F1024 Alcohol dependence with alcohol-induced mood disorder: Secondary | ICD-10-CM | POA: Diagnosis present

## 2023-06-29 DIAGNOSIS — F10231 Alcohol dependence with withdrawal delirium: Secondary | ICD-10-CM | POA: Diagnosis present

## 2023-06-29 DIAGNOSIS — F32A Depression, unspecified: Secondary | ICD-10-CM | POA: Diagnosis present

## 2023-06-29 DIAGNOSIS — Y908 Blood alcohol level of 240 mg/100 ml or more: Secondary | ICD-10-CM | POA: Diagnosis present

## 2023-06-29 DIAGNOSIS — F419 Anxiety disorder, unspecified: Secondary | ICD-10-CM | POA: Diagnosis present

## 2023-06-29 LAB — RENAL FUNCTION PANEL
Albumin: 3.7 g/dL (ref 3.5–5.0)
Anion gap: 12 (ref 5–15)
BUN: 15 mg/dL (ref 6–20)
CO2: 25 mmol/L (ref 22–32)
Calcium: 8.4 mg/dL — ABNORMAL LOW (ref 8.9–10.3)
Chloride: 101 mmol/L (ref 98–111)
Creatinine, Ser: 0.92 mg/dL (ref 0.61–1.24)
GFR, Estimated: >60 mL/min (ref >60)
Glucose, Bld: 89 mg/dL (ref 70–99)
Phosphorus: 4.8 mg/dL — ABNORMAL HIGH (ref 2.5–4.6)
Potassium: 3.9 mmol/L (ref 3.5–5.1)
Sodium: 138 mmol/L (ref 135–145)

## 2023-06-29 LAB — MAGNESIUM: Magnesium: 1.9 mg/dL (ref 1.7–2.4)

## 2023-06-29 MED ORDER — MAGNESIUM SULFATE 2 GM/50ML IV SOLN
2.0000 g | Freq: Once | INTRAVENOUS | Status: AC
  Administered 2023-06-29: 2 g via INTRAVENOUS
  Filled 2023-06-29: qty 50

## 2023-06-29 MED ORDER — DEXTROSE IN LACTATED RINGERS 5 % IV SOLN: INTRAVENOUS | Status: AC

## 2023-06-29 MED ORDER — HALOPERIDOL LACTATE 5 MG/ML IJ SOLN
5.0000 mg | Freq: Four times a day (QID) | INTRAMUSCULAR | Status: DC
Start: 2023-06-29 — End: 2023-07-01
  Administered 2023-06-29 – 2023-07-01 (×8): 5 mg via INTRAVENOUS
  Filled 2023-06-29 (×8): qty 1

## 2023-06-29 NOTE — Progress Notes (Signed)
NAME:  Jermaine [name], MRN:  [id number], DOB:  August 01, 1965, LOS: 4 ADMISSION DATE:  06/25/2023, CONSULTATION DATE:  06/26/23 REFERRING MD:  Jarvis Newcomer CHIEF COMPLAINT:  EtOH Withdrawal   History of Present Illness:  Jermaine [name] is a 58 y.o. male who has a PMH as below including but not limited to anxiety, depression, EtOH dependence. He presented to urgent care 1/28 with confusion, tremors, anxiety, presyncopal symptoms. He was found to have tachycardia, tachypnea, hypoxia and was subsequently told to come to the ED for further evaluaiton.  In ED, he had persistent symptoms. He informed staff that his last drink was earlier that day. He normally drinks 1 - 2 pints Titos every day or every other day along with 2 - 4 White Claws. On day of presentation, he believes he only drank [employer] and no liquor.  He was found to have ethanol of 283 and despite Ativan and Versed, continued to have symptoms. He was subsequently admitted by [facility] for EtOH withdrawal and was started on Precedex and Ativan per CIWA. Head CT and UDS negative. He denies any recreational drug use. He has had withdrawal in the past requiring hospitalization. Appears just admitted to this facility 05/20/23 through 05/24/23 for the same as well as suicidal/homicidal ideation. He was IVC'd at the time and was seen by psych who cleared him for discharge. Per chart review, he has had multiple admissions for the same with 5 admissions in the last 6 months.  On PM 1/29, he remained on 0.4 Precedex; therefore, PCCM asked to assist with his care. He is A&O x 3, no deficits. He had some hallucinations this AM but currently is lucid and appropriately interactive in conversation. He tells me that he has a job interview tomorrow (Thursday 1/30) which he would really like to not miss. I informed him that he would not be discharged by then and recommended he call the employer and request a delay in his interview.  Pertinent  Medical History:  has Alcohol  abuse with intoxication (HCC); Alcohol abuse; Hepatic steatosis; Anxiety and depression; Sinus tachycardia; Class 1 obesity; Secondary polycythemia; DTs (delirium tremens) (HCC); Alcohol withdrawal (HCC); Facial fracture due to fall [facility]); Orbital floor (blow-out) closed fracture (HCC); Alcohol-induced mood disorder (HCC); C. difficile colitis; and Acute hypoxic respiratory failure (HCC) on their problem list.  Significant Hospital Events: Including procedures, antibiotic start and stop dates in addition to other pertinent events   1/28 admit 1/29 PCCM consult 1?31 - Unable to wean precedex thus far, up to 1.8 dex this am.  Either sedated or agitated, swinging at staff this am  Interim History / Subjective:   06/29/2023: Required Haldol and even additional phenobarb.  This is despite being on Precedex infusion.  Currently afebrile with a normal white count.  Intermittent agitation overnight. Objective:  Blood pressure (!) 147/103, pulse 65, temperature 98.3 F (36.8 C), temperature source Oral, resp. rate 20, height 5\' 10"  (1.778 m), weight 90.7 kg, SpO2 98%.        Intake/Output Summary (Last 24 hours) at 06/29/2023 1011 Last data filed at 06/29/2023 0800 Gross per 24 hour  Intake 1235.2 ml  Output 1950 ml  Net -714.8 ml   Filed Weights   06/25/23 1806  Weight: 90.7 kg    Examination: General Appearance:  Looks criticall ill OBESE -no but he is overweight. Head:  Normocephalic, without obvious abnormality, atraumatic Eyes:  PERRL - yes, conjunctiva/corneas - muddy     Ears:  Normal external ear canals, both  ears Nose:  G tube - no but ha [place] Throat:  ETT TUBE - no , OG tube - n Neck:  Supple,  No enlargement/tenderness/nodules Lungs: Clear to auscultation bilaterally, Heart:  S1 and S2 normal, no murmur, CVP - no.  Pressors - no but he is on Precedex Abdomen:  Soft, no masses, no organomegaly Genitalia / Rectal:  Not done Extremities:  Extremities- intact Skin:  ntact in exposed  areas . Sacral area - not examined Neurologic:  Sedation -Precedex phenobarb as needed, Haldol x 1 yesterday-> RASS --2 to +2 sometimes even +3. Moves all 4s - yes. CAM-ICU -positive for delirium. Orientation -immediately follows commands     Labs/imaging personally reviewed:  CT head 1/29 > neg.  Assessment & Plan:   Acute alcohol withdrawal with DTs Acute metabolic encephalopathy - 2/2 above  06/29/2023: Persistent but not worse  Plan - add Haldol as needed with QTc monitoring - cont phenobarb 97.5mg  q8hrs and consider additional boluses,  -Current Precedex but titrate as needed down - prn ativan severe agitation/ seizure - RESTRAINTs - NPO till mental status more stable, cont MIVF - seizure precautions - IV thiamine/ folic, MVI when taking PO - Maintain neuro protective measures; goal for eurothermia, euglycemia, eunatermia, normoxia, and PCO2 goal of 35-40 - bowel regiment prn, monitor for urinary retention - monitor infectious symptoms> remains afebrile, normal labs.  Monitor clinically   Acute hypoxic respiratory failure  - CXR neg on admit.  Remains afebrile, normal WBC - Query undiagnosed OSA? Consider outpt f/u  P:  - currently on room air, protecting airway - aspiration precautions - prn supplemental O2 as needed to maintain SpO2 > 92%, currently on RA - Mobilize when able, IS - TOC pending   Mild hypomagnesemia less than 2 g%  Plan - Replete   Best practice (evaluated daily):  Diet/type: NPO DVT prophylaxis: LMWH Pressure ulcer(s): pressure ulcer assessment deferred  GI prophylaxis: N/A Lines: N/A Foley:  N/A Code Status:  full code Last date of multidisciplinary goals of care discussion: pending 1/31  Family sister Caesar Chestnut [phone] - updated over phone (patient tried to talked on phone with sister yesterday but he was confused . She is aware of his history)    ATTESTATION & SIGNATURE   The patient Jermaine [name] is critically ill with  multiple organ systems failure and requires high complexity decision making for assessment and support, frequent evaluation and titration of therapies, application of advanced monitoring technologies and extensive interpretation of multiple databases and discussion with other appropriate health care personnel such as bedside nurses, social workers, case Production designer, theatre/television/film, consultants, respiratory therapists, nutritionists, secretaries etc.,  Critical care time includes but is not restricted to just documentation time. Documentation can happen in parallel or sequential to care time depending on case mix urgency and priorities for the shift. So, overall critical Care Time devoted to patient care services described in this note is  30  Minutes.   This time reflects time of care of this signee Dr Kalman Shan which includ does not reflect procedure time, or teaching time or supervisory time of PA/NP/Med student/Med Resident etc but could involve care discussion time     Dr. Kalman Shan, M.D., [facility]P Pulmonary and Critical Care Medicine Staff Physician, [facility] [place] [facility] Pager: [phone], If no answer or between  15:00h - 7:00h: call [phone]  06/29/2023 10:11 AM    LABS    PULMONARY No  results for input(s): "PHART", "PCO2ART", "PO2ART", "HCO3", "TCO2", "O2SAT" in the last 168 hours.  Invalid input(s): "PCO2", "PO2"  CBC Recent Labs  Lab 06/26/23 0302 06/27/23 0323 06/28/23 0320  HGB 15.0 15.6 17.5*  HCT 44.8 46.2 51.7  WBC 7.3 6.3 8.8  PLT 184 161 170    COAGULATION No results for input(s): "INR" in the last 168 hours.  CARDIAC  No results for input(s): "TROPONINI" in the last 168 hours. No results for input(s): "PROBNP" in the last 168 hours.   CHEMISTRY Recent Labs  Lab 06/25/23 1813 06/26/23 0302 06/27/23 0323 06/28/23 0320 06/29/23 0247  NA 139 141 135 136 138  K 3.5 4.1 3.6 3.8 3.9  CL 98 103 101 101 101  CO2 25 23  25 25 25   GLUCOSE 90 93 112* 92 89  BUN 18 18 11 10 15   CREATININE 1.05 0.99 0.76 0.87 0.92  CALCIUM 8.7* 8.4* 8.5* 8.8* 8.4*  MG  --  1.7 2.1 2.0 1.9  PHOS  --  3.2  --   --  4.8*   Estimated Creatinine Clearance: 100.4 mL/min (by C-G formula based on SCr of 0.92 mg/dL).   LIVER Recent Labs  Lab 06/25/23 1813 06/26/23 0302 06/27/23 0323 06/28/23 0320 06/29/23 0247  AST 50* 42* 39 36  --   ALT 50* 41 37 35  --   ALKPHOS 60 41 36* 41  --   BILITOT 0.7 0.9 1.3* 0.8  --   PROT 7.5 6.3* 6.1* 7.0  --   ALBUMIN 4.4 3.7 3.5 4.1 3.7     INFECTIOUS No results for input(s): "LATICACIDVEN", "PROCALCITON" in the last 168 hours.   ENDOCRINE CBG (last 3)  Recent Labs    06/28/23 0757  GLUCAP 104*         IMAGING x48h  - image(s) personally visualized  -   highlighted in bold No results found.

## 2023-06-29 NOTE — Plan of Care (Signed)
  Problem: Education: Goal: Knowledge of General Education information will improve Description: Including pain rating scale, medication(s)/side effects and non-pharmacologic comfort measures Outcome: Progressing   Problem: Health Behavior/Discharge Planning: Goal: Ability to manage health-related needs will improve Outcome: Progressing   Problem: Clinical Measurements: Goal: Ability to maintain clinical measurements within normal limits will improve Outcome: Progressing Goal: Diagnostic test results will improve Outcome: Progressing Goal: Cardiovascular complication will be avoided Outcome: Progressing   Problem: Activity: Goal: Risk for activity intolerance will decrease Outcome: Progressing   

## 2023-06-29 NOTE — Progress Notes (Signed)
06/28/2023   At 1945 hours, patient found trying to get out of bed, having ripped left wrist soft restraint apart. A new restraint was secured but patient repeatedly tried to throw legs over side rail and slide to bottom of bed. Single PIV in place was dislodged by patient and required replacement. Multiple monitoring apparatuses were repeatedly removed or dislodged by patient (multiple EKG leads, pulse oximeter, blood pressure cuff, etc.). Patient varies between cogent conversation, incoherent speech and confusion with hallucinatory statements. Agitation increased as patient was incontinent of large loose stool, trying to get out of bed with wrist restraints and safety mitts in place. Patient proceeded to kick IV pole with pumps infusing over in the room while staff working at bedside. Multiple staff members required to safely restrain patient from self-harm and harm to others.  At 2100 hours, notified e-Link of patient's extreme agitation, with Precedex at 1.2 mcg/kg/hr, no improvement after scheduled phenobarbital. Ativan administered with little improvement.  E-visit via camera with Dr. Warrick Parisian and new orders placed including addition of soft ankle restraints. After only intermittent improvement, with patient frequently yelling out and continuing to attempt to get out of bed with 4-point restraints in place, thus setting off the "Out of bed" alarm, Zyprexa administered per order. Agitation, anxiety and restlessness continued, with refusal to follow commands and resisting patient care, while making verbal threats toward staff. Ativan dose repeated x 1 with decreased agitation within an hour. Precedex drip titrated down according to sedation level.  Cindy S. Clelia Croft BSN, RN, CCRP, CCRN 06/29/2023 6:00 AM

## 2023-06-29 NOTE — Plan of Care (Signed)
  Problem: Clinical Measurements: Goal: Ability to maintain clinical measurements within normal limits will improve Outcome: Progressing Goal: Will remain free from infection Outcome: Progressing Goal: Diagnostic test results will improve Outcome: Progressing Goal: Respiratory complications will improve Outcome: Progressing Goal: Cardiovascular complication will be avoided Outcome: Progressing   Problem: Activity: Goal: Risk for activity intolerance will decrease Outcome: Progressing   Problem: Nutrition: Goal: Adequate nutrition will be maintained Outcome: Progressing   Problem: Elimination: Goal: Will not experience complications related to bowel motility Outcome: Progressing Goal: Will not experience complications related to urinary retention Outcome: Progressing   Problem: Pain Managment: Goal: General experience of comfort will improve and/or be controlled Outcome: Progressing   Problem: Safety: Goal: Ability to remain free from injury will improve Outcome: Progressing   Problem: Skin Integrity: Goal: Risk for impaired skin integrity will decrease Outcome: Progressing   Problem: Safety: Goal: Non-violent Restraint(s) Outcome: Progressing   Problem: Education: Goal: Knowledge of General Education information will improve Description: Including pain rating scale, medication(s)/side effects and non-pharmacologic comfort measures Outcome: Not Progressing   Problem: Health Behavior/Discharge Planning: Goal: Ability to manage health-related needs will improve Outcome: Not Progressing   Problem: Coping: Goal: Level of anxiety will decrease Outcome: Not Progressing   Cindy S. Clelia Croft BSN, RN, [employer], CCRN 06/29/2023 3:48 AM

## 2023-06-30 DIAGNOSIS — F10231 Alcohol dependence with withdrawal delirium: Secondary | ICD-10-CM | POA: Diagnosis not present

## 2023-06-30 DIAGNOSIS — J9601 Acute respiratory failure with hypoxia: Secondary | ICD-10-CM | POA: Diagnosis not present

## 2023-06-30 DIAGNOSIS — F10931 Alcohol use, unspecified with withdrawal delirium: Secondary | ICD-10-CM | POA: Diagnosis not present

## 2023-06-30 DIAGNOSIS — G9341 Metabolic encephalopathy: Secondary | ICD-10-CM | POA: Diagnosis not present

## 2023-06-30 DIAGNOSIS — Z1152 Encounter for screening for COVID-19: Secondary | ICD-10-CM | POA: Diagnosis not present

## 2023-06-30 LAB — COMPREHENSIVE METABOLIC PANEL
ALT: 22 U/L (ref 0–44)
AST: 23 U/L (ref 15–41)
Albumin: 3.8 g/dL (ref 3.5–5.0)
Alkaline Phosphatase: 41 U/L (ref 38–126)
Anion gap: 9 (ref 5–15)
BUN: 14 mg/dL (ref 6–20)
CO2: 26 mmol/L (ref 22–32)
Calcium: 8.2 mg/dL — ABNORMAL LOW (ref 8.9–10.3)
Chloride: 100 mmol/L (ref 98–111)
Creatinine, Ser: 0.77 mg/dL (ref 0.61–1.24)
GFR, Estimated: >60 mL/min (ref >60)
Glucose, Bld: 100 mg/dL — ABNORMAL HIGH (ref 70–99)
Potassium: 3.5 mmol/L (ref 3.5–5.1)
Sodium: 135 mmol/L (ref 135–145)
Total Bilirubin: 1.4 mg/dL — ABNORMAL HIGH (ref 0.0–1.2)
Total Protein: 6.4 g/dL — ABNORMAL LOW (ref 6.5–8.1)

## 2023-06-30 LAB — CK TOTAL AND CKMB (NOT AT ARMC)
CK, MB: 2.3 ng/mL (ref 0.5–5.0)
Total CK: 169 U/L (ref 49–397)

## 2023-06-30 LAB — MAGNESIUM: Magnesium: 2.5 mg/dL — ABNORMAL HIGH (ref 1.7–2.4)

## 2023-06-30 LAB — CBC
HCT: 47.9 % (ref 39.0–52.0)
Hemoglobin: 15.9 g/dL (ref 13.0–17.0)
MCH: 31.8 pg (ref 26.0–34.0)
MCHC: 33.2 g/dL (ref 30.0–36.0)
MCV: 95.8 fL (ref 80.0–100.0)
Platelets: 152 10*3/uL (ref 150–400)
RBC: 5 MIL/uL (ref 4.22–5.81)
RDW: 12.5 % (ref 11.5–15.5)
WBC: 10.5 10*3/uL (ref 4.0–10.5)
nRBC: 0 % (ref 0.0–0.2)

## 2023-06-30 LAB — PHOSPHORUS: Phosphorus: 2.1 mg/dL — ABNORMAL LOW (ref 2.5–4.6)

## 2023-06-30 MED ORDER — HYDRALAZINE HCL 20 MG/ML IJ SOLN
10.0000 mg | Freq: Four times a day (QID) | INTRAMUSCULAR | Status: DC | PRN
  Administered 2023-06-30 (×2): 10 mg via INTRAVENOUS
  Filled 2023-06-30 (×2): qty 1

## 2023-06-30 MED ORDER — POTASSIUM PHOSPHATES 15 MMOLE/5ML IV SOLN
15.0000 mmol | Freq: Once | INTRAVENOUS | Status: AC
  Administered 2023-06-30: 15 mmol via INTRAVENOUS
  Filled 2023-06-30: qty 5

## 2023-06-30 MED ORDER — DEXTROSE IN LACTATED RINGERS 5 % IV SOLN: INTRAVENOUS | Status: DC

## 2023-06-30 NOTE — Progress Notes (Signed)
NAME:  Jermaine [name], MRN:  [id number], DOB:  [birth year], LOS: 5 ADMISSION DATE:  06/25/2023, CONSULTATION DATE:  06/26/23 REFERRING MD:  Jarvis Newcomer CHIEF COMPLAINT:  EtOH Withdrawal   BRIEF  Jermaine [name] is a 58 y.o. male who has a PMH as below including but not limited to anxiety, depression, EtOH dependence. He presented to urgent care 1/28 with confusion, tremors, anxiety, presyncopal symptoms. He was found to have tachycardia, tachypnea, hypoxia and was subsequently told to come to the ED for further evaluaiton.  In ED, he had persistent symptoms. He informed staff that his last drink was earlier that day. He normally drinks 1 - 2 pints Titos every day or every other day along with 2 - 4 White Claws. On day of presentation, he believes he only drank [employer] and no liquor.  He was found to have ethanol of 283 and despite Ativan and Versed, continued to have symptoms. He was subsequently admitted by [facility] for EtOH withdrawal and was started on Precedex and Ativan per CIWA. Head CT and UDS negative. He denies any recreational drug use. He has had withdrawal in the past requiring hospitalization. Appears just admitted to this facility 05/20/23 through 05/24/23 for the same as well as suicidal/homicidal ideation. He was IVC'd at the time and was seen by psych who cleared him for discharge. Per chart review, he has had multiple admissions for the same with 5 admissions in the last 6 months.  On PM 1/29, he remained on 0.4 Precedex; therefore, PCCM asked to assist with his care. He is A&O x 3, no deficits. He had some hallucinations this AM but currently is lucid and appropriately interactive in conversation. He tells me that he has a job interview tomorrow (Thursday 1/30) which he would really like to not miss. I informed him that he would not be discharged by then and recommended he call the employer and request a delay in his interview.  Pertinent  Medical History:  has Alcohol abuse with intoxication  (HCC); Alcohol abuse; Hepatic steatosis; Anxiety and depression; Sinus tachycardia; Class 1 obesity; Secondary polycythemia; DTs (delirium tremens) (HCC); Alcohol withdrawal (HCC); Facial fracture due to fall [facility]); Orbital floor (blow-out) closed fracture (HCC); Alcohol-induced mood disorder (HCC); C. difficile colitis; and Acute hypoxic respiratory failure (HCC) on their problem list.  Significant Hospital Events: Including procedures, antibiotic start and stop dates in addition to other pertinent events   1/28 admit 1/29 PCCM consult 1/31 - Unable to wean precedex thus far, up to 1.8 dex this am.  Either sedated or agitated, swinging at staff this am 2/1 - 06/29/2023: Required Haldol and even additional phenobarb.  This is despite being on Precedex infusion.  Currently afebrile with a normal white count.  Intermittent agitation overnight.  Interim History / Subjective:   06/30/2023: Afebrile with normal white count.  No real change.  Has waxing and waning agitation and drowsiness although has woken up and asked for liquids and food.  Continues on Precedex Haldol phenobarb and benzos.  Objective:  Blood pressure 138/78, pulse 65, temperature 98.2 F (36.8 C), temperature source Oral, resp. rate 18, height 5\' 10"  (1.778 m), weight 89.9 kg, SpO2 96%.        Intake/Output Summary (Last 24 hours) at 06/30/2023 0839 Last data filed at 06/30/2023 0427 Gross per 24 hour  Intake 1058.59 ml  Output 1000 ml  Net 58.59 ml   Filed Weights   06/25/23 1806 06/30/23 0354  Weight: 90.7 kg 89.9 kg  Examination: General Appearance:  Looks criticall ill OBESE - + Head:  Normocephalic, without obvious abnormality, atraumatic Eyes:  PERRL - yes, conjunctiva/corneas - mudd     Ears:  Normal external ear canals, both ears Nose:  G tube - no but has nasal oxygen Throat:  ETT TUBE - no , OG tube - no Neck:  Supple,  No enlargement/tenderness/nodules Lungs: Clear to auscultation bilaterally,  SNORING.   Heart:  S1 and S2 normal, no murmur, CVP - no.  Pressors - no Abdomen:  Soft, no masses, no organomegaly Genitalia / Rectal:  Not done Extremities:  Extremities- intac Skin:  ntact in exposed areas . Sacral area - not examin4ed Neurologic:  Sedation -Precedex and other medications-> RASS -currently -3. Moves all 4s -yes. CAM-ICU -positive for delirium. Orientation -intermittently follows some commands     Labs/imaging personally reviewed:  CT head 1/29 > neg.  Assessment & Plan:   Acute alcohol withdrawal with DTs Acute metabolic encephalopathy - 2/2 above  06/30/2023: Persistent but not worse and unlikely better  Plan -Continue Haldol since 06/29/2023 as needed with QTc monitoring - > get EKG 06/30/23 - cont phenobarb 97.5mg  q8hrs and consider additional boluses,  -Current Precedex but titrate as needed down - prn ativan severe agitation/ seizure - RESTRAINTs - NPO till mental status more stable, cont MIVF - seizure precautions - IV thiamine/ folic, MVI when taking PO - Maintain neuro protective measures; goal for eurothermia, euglycemia, eunatermia, normoxia, and PCO2 goal of 35-40 - bowel regiment prn, monitor for urinary retention - monitor infectious symptoms> remains afebrile, normal labs.  Monitor clinically   - Query undiagnosed OSA?    Plan  - Consider outpt f/u   Acute hypoxic respiratory failure  - CXR neg on admit.  Remains afebrile, normal WBC   P:  - currently on room air, protecting airway - aspiration precautions - prn supplemental O2 as needed to maintain SpO2 > 92%, currently on RA - Mobilize when able, IS - TOC pending   Mild hypophosphatemia 06/30/2023  Plan - Replete   Best practice (evaluated daily):  Diet/type: NPO DVT prophylaxis: LMWH Pressure ulcer(s): pressure ulcer assessment deferred  GI prophylaxis: N/A Lines: N/A Foley:  N/A Code Status:  full code Last date of multidisciplinary goals of care discussion: pending 1/31  Family  sister Caesar Chestnut [phone]   06/29/23 - updated over phone (patient tried to talked on phone with sister yesterday but he was confused . She is aware of his history) 06/30/23 - called  LMTCB    ATTESTATION & SIGNATURE   The patient Jermaine [name] is critically ill with multiple organ systems failure and requires high complexity decision making for assessment and support, frequent evaluation and titration of therapies, application of advanced monitoring technologies and extensive interpretation of multiple databases and discussion with other appropriate health care personnel such as bedside nurses, social workers, case Production designer, theatre/television/film, consultants, respiratory therapists, nutritionists, secretaries etc.,  Critical care time includes but is not restricted to just documentation time. Documentation can happen in parallel or sequential to care time depending on case mix urgency and priorities for the shift. So, overall critical Care Time devoted to patient care services described in this note is  30  Minutes.   This time reflects time of care of this signee Dr Kalman Shan which includ does not reflect procedure time, or teaching time or supervisory time of PA/NP/Med student/Med Resident etc but could involve care discussion time     Dr. Carmin Muskrat  Marchelle Gearing, M.D., F.C.C.P Pulmonary and Critical Care Medicine Staff Physician, [facility] Pager: [phone], If no answer or between  15:00h - 7:00h: call [phone]  06/30/2023 8:53 AM     LABS    PULMONARY No results for input(s): "PHART", "PCO2ART", "PO2ART", "HCO3", "TCO2", "O2SAT" in the last 168 hours.  Invalid input(s): "PCO2", "PO2"  CBC Recent Labs  Lab 06/27/23 0323 06/28/23 0320 06/30/23 0252  HGB 15.6 17.5* 15.9  HCT 46.2 51.7 47.9  WBC 6.3 8.8 10.5  PLT 161 170 152    COAGULATION No results for input(s): "INR" in the last 168 hours.  CARDIAC  No results for input(s):  "TROPONINI" in the last 168 hours. No results for input(s): "PROBNP" in the last 168 hours.   CHEMISTRY Recent Labs  Lab 06/26/23 0302 06/27/23 0323 06/28/23 0320 06/29/23 0247 06/30/23 0252  NA 141 135 136 138 135  K 4.1 3.6 3.8 3.9 3.5  CL 103 101 101 101 100  CO2 23 25 25 25 26   GLUCOSE 93 112* 92 89 100*  BUN 18 11 10 15 14   CREATININE 0.99 0.76 0.87 0.92 0.77  CALCIUM 8.4* 8.5* 8.8* 8.4* 8.2*  MG 1.7 2.1 2.0 1.9 2.5*  PHOS 3.2  --   --  4.8* 2.1*   Estimated Creatinine Clearance: 115 mL/min (by C-G formula based on SCr of 0.77 mg/dL).   LIVER Recent Labs  Lab 06/25/23 1813 06/26/23 0302 06/27/23 0323 06/28/23 0320 06/29/23 0247 06/30/23 0252  AST 50* 42* 39 36  --  23  ALT 50* 41 37 35  --  22  ALKPHOS 60 41 36* 41  --  41  BILITOT 0.7 0.9 1.3* 0.8  --  1.4*  PROT 7.5 6.3* 6.1* 7.0  --  6.4*  ALBUMIN 4.4 3.7 3.5 4.1 3.7 3.8     INFECTIOUS No results for input(s): "LATICACIDVEN", "PROCALCITON" in the last 168 hours.   ENDOCRINE CBG (last 3)  Recent Labs    06/28/23 0757  GLUCAP 104*         IMAGING x48h  - image(s) personally visualized  -   highlighted in bold No results found.

## 2023-06-30 NOTE — Progress Notes (Addendum)
eLink Physician-Brief Progress Note Patient Name: Jermaine [name] DOB: [birth year] MRN: [id number]   Date of Service  06/30/2023  HPI/Events of Note  Patient with acute encephalopathy and likely alcohol withdrawal complicated by probable undiagnosed OSA.  Remains n.p.o. due to encephalopathy  eICU Interventions  Renew IVF   2343 -restraints renewed     [name] 06/30/2023, 8:10 PM

## 2023-06-30 NOTE — Plan of Care (Signed)
  Problem: Education: Goal: Knowledge of General Education information will improve Description: Including pain rating scale, medication(s)/side effects and non-pharmacologic comfort measures Outcome: Not Progressing   Problem: Health Behavior/Discharge Planning: Goal: Ability to manage health-related needs will improve Outcome: Not Progressing   Problem: Activity: Goal: Risk for activity intolerance will decrease Outcome: Not Progressing   Problem: Nutrition: Goal: Adequate nutrition will be maintained Outcome: Not Progressing   Problem: Coping: Goal: Level of anxiety will decrease Outcome: Not Progressing   Problem: Safety: Goal: Ability to remain free from injury will improve Outcome: Not Progressing   

## 2023-06-30 NOTE — Progress Notes (Signed)
eLink Physician-Brief Progress Note Patient Name: Jermaine [name] DOB: December 29, 1965 MRN: [id number]   Date of Service  06/30/2023  HPI/Events of Note  Seizure-like activity with alcohol withdrawal.  Now hypertensive, likely transient  eICU Interventions  If persistent, add hydralazine.     Intervention Category Intermediate Interventions: Hypertension - evaluation and management  Fayette Gasner 06/30/2023, 12:20 AM

## 2023-06-30 NOTE — Plan of Care (Signed)
  Problem: Education: Goal: Knowledge of General Education information will improve Description: Including pain rating scale, medication(s)/side effects and non-pharmacologic comfort measures Outcome: Not Progressing   Problem: Health Behavior/Discharge Planning: Goal: Ability to manage health-related needs will improve Outcome: Not Progressing   Problem: Activity: Goal: Risk for activity intolerance will decrease Outcome: Not Progressing   Problem: Nutrition: Goal: Adequate nutrition will be maintained Outcome: Not Progressing   Problem: Safety: Goal: Ability to remain free from injury will improve Outcome: Not Progressing

## 2023-06-30 NOTE — Progress Notes (Signed)
[facility] ADULT ICU REPLACEMENT PROTOCOL   The patient does apply for the [facility] Adult ICU Electrolyte Replacment Protocol based on the criteria listed below:   1.Exclusion criteria: TCTS, ECMO, Dialysis, and Myasthenia Gravis patients 2. Is GFR >/= 30 ml/min? Yes.    Patient's GFR today is >60 3. Is SCr </= 2? Yes.   Patient's SCr is 0.77 mg/dL 4. Did SCr increase >/= 0.5 in 24 hours? No. 5.Pt's weight >40kg  Yes.   6. Abnormal electrolyte(s): Phos 2.1, K+ 3.5  7. Electrolytes replaced per protocol 8.  Call MD STAT for K+ </= 2.5, Phos </= 1, or Mag </= 1 Physician:  Dr. Loralyn Freshwater, Lilia Argue 06/30/2023 4:31 AM

## 2023-07-01 ENCOUNTER — Inpatient Hospital Stay (HOSPITAL_COMMUNITY): Payer: BLUE CROSS/BLUE SHIELD

## 2023-07-01 DIAGNOSIS — Z1152 Encounter for screening for COVID-19: Secondary | ICD-10-CM | POA: Diagnosis not present

## 2023-07-01 DIAGNOSIS — J9601 Acute respiratory failure with hypoxia: Secondary | ICD-10-CM | POA: Diagnosis not present

## 2023-07-01 DIAGNOSIS — F10931 Alcohol use, unspecified with withdrawal delirium: Secondary | ICD-10-CM | POA: Diagnosis not present

## 2023-07-01 DIAGNOSIS — G9341 Metabolic encephalopathy: Secondary | ICD-10-CM | POA: Diagnosis not present

## 2023-07-01 DIAGNOSIS — F10231 Alcohol dependence with withdrawal delirium: Secondary | ICD-10-CM | POA: Diagnosis not present

## 2023-07-01 LAB — GLUCOSE, CAPILLARY
Glucose-Capillary: 99 mg/dL (ref 70–99)
Glucose-Capillary: 129 mg/dL — ABNORMAL HIGH (ref 70–99)
Glucose-Capillary: 108 mg/dL — ABNORMAL HIGH (ref 70–99)
Glucose-Capillary: 116 mg/dL — ABNORMAL HIGH (ref 70–99)

## 2023-07-01 LAB — COMPREHENSIVE METABOLIC PANEL
ALT: 18 U/L (ref 0–44)
AST: 19 U/L (ref 15–41)
Albumin: 3.5 g/dL (ref 3.5–5.0)
Alkaline Phosphatase: 43 U/L (ref 38–126)
Anion gap: 11 (ref 5–15)
BUN: 9 mg/dL (ref 6–20)
CO2: 23 mmol/L (ref 22–32)
Calcium: 8.3 mg/dL — ABNORMAL LOW (ref 8.9–10.3)
Chloride: 104 mmol/L (ref 98–111)
Creatinine, Ser: 0.68 mg/dL (ref 0.61–1.24)
GFR, Estimated: >60 mL/min (ref >60)
Glucose, Bld: 106 mg/dL — ABNORMAL HIGH (ref 70–99)
Potassium: 3.5 mmol/L (ref 3.5–5.1)
Sodium: 138 mmol/L (ref 135–145)
Total Bilirubin: 1.2 mg/dL (ref 0.0–1.2)
Total Protein: 6.2 g/dL — ABNORMAL LOW (ref 6.5–8.1)

## 2023-07-01 LAB — CBC
HCT: 47 % (ref 39.0–52.0)
Hemoglobin: 15.5 g/dL (ref 13.0–17.0)
MCH: 31.3 pg (ref 26.0–34.0)
MCHC: 33 g/dL (ref 30.0–36.0)
MCV: 94.9 fL (ref 80.0–100.0)
Platelets: 151 10*3/uL (ref 150–400)
RBC: 4.95 MIL/uL (ref 4.22–5.81)
RDW: 12.4 % (ref 11.5–15.5)
WBC: 10.7 10*3/uL — ABNORMAL HIGH (ref 4.0–10.5)
nRBC: 0 % (ref 0.0–0.2)

## 2023-07-01 LAB — MAGNESIUM
Magnesium: 2 mg/dL (ref 1.7–2.4)
Magnesium: 2.2 mg/dL (ref 1.7–2.4)
Magnesium: 2.1 mg/dL (ref 1.7–2.4)

## 2023-07-01 LAB — PHOSPHORUS
Phosphorus: 1.8 mg/dL — ABNORMAL LOW (ref 2.5–4.6)
Phosphorus: 3 mg/dL (ref 2.5–4.6)
Phosphorus: 3.9 mg/dL (ref 2.5–4.6)

## 2023-07-01 MED ORDER — ONDANSETRON HCL 4 MG PO TABS: 4.0000 mg | ORAL_TABLET | Freq: Four times a day (QID) | ORAL | Status: DC | PRN

## 2023-07-01 MED ORDER — OSMOLITE 1.5 CAL PO LIQD
1000.0000 mL | ORAL | Status: DC
  Administered 2023-07-01 – 2023-07-09 (×10): 1000 mL
  Filled 2023-07-01 (×14): qty 1000

## 2023-07-01 MED ORDER — ACETAMINOPHEN 325 MG PO TABS
650.0000 mg | ORAL_TABLET | Freq: Four times a day (QID) | ORAL | Status: DC | PRN
  Administered 2023-07-04 – 2023-07-10 (×2): 650 mg
  Filled 2023-07-01 (×2): qty 2

## 2023-07-01 MED ORDER — ACETAMINOPHEN 650 MG RE SUPP: 650.0000 mg | Freq: Four times a day (QID) | RECTAL | Status: DC | PRN

## 2023-07-01 MED ORDER — HYDROXYZINE HCL 25 MG PO TABS: 25.0000 mg | ORAL_TABLET | Freq: Four times a day (QID) | ORAL | Status: DC | PRN

## 2023-07-01 MED ORDER — CLONIDINE HCL 0.1 MG PO TABS
0.1000 mg | ORAL_TABLET | ORAL | Status: DC
  Administered 2023-07-03 – 2023-07-04 (×3): 0.1 mg
  Filled 2023-07-01 (×3): qty 1

## 2023-07-01 MED ORDER — ORAL CARE MOUTH RINSE
15.0000 mL | OROMUCOSAL | Status: DC
  Administered 2023-07-02 – 2023-07-03 (×5): 15 mL via OROMUCOSAL

## 2023-07-01 MED ORDER — LORAZEPAM 2 MG/ML IJ SOLN
1.0000 mg | INTRAMUSCULAR | Status: DC
  Administered 2023-07-01 – 2023-07-03 (×14): 1 mg via INTRAVENOUS
  Filled 2023-07-01 (×14): qty 1

## 2023-07-01 MED ORDER — CHLORDIAZEPOXIDE HCL 25 MG PO CAPS: 25.0000 mg | ORAL_CAPSULE | Freq: Four times a day (QID) | ORAL | Status: DC | PRN

## 2023-07-01 MED ORDER — CHLORDIAZEPOXIDE HCL 25 MG PO CAPS: 25.0000 mg | ORAL_CAPSULE | ORAL | Status: DC

## 2023-07-01 MED ORDER — SODIUM CHLORIDE 0.9 % IV SOLN: INTRAVENOUS | Status: AC

## 2023-07-01 MED ORDER — PIVOT 1.5 CAL PO LIQD
1000.0000 mL | ORAL | Status: DC
  Filled 2023-07-01: qty 1000

## 2023-07-01 MED ORDER — CHLORDIAZEPOXIDE HCL 25 MG PO CAPS
25.0000 mg | ORAL_CAPSULE | Freq: Four times a day (QID) | ORAL | Status: AC
  Administered 2023-07-01 (×3): 25 mg
  Filled 2023-07-01 (×3): qty 1

## 2023-07-01 MED ORDER — THIAMINE MONONITRATE 100 MG PO TABS
100.0000 mg | ORAL_TABLET | Freq: Every day | ORAL | Status: DC
  Administered 2023-07-03 – 2023-07-07 (×3): 100 mg
  Filled 2023-07-01 (×5): qty 1

## 2023-07-01 MED ORDER — FOLIC ACID 1 MG PO TABS
1.0000 mg | ORAL_TABLET | Freq: Every day | ORAL | Status: DC
  Administered 2023-07-01 – 2023-07-10 (×10): 1 mg
  Filled 2023-07-01 (×10): qty 1

## 2023-07-01 MED ORDER — CLONIDINE HCL 0.1 MG PO TABS: 0.1000 mg | ORAL_TABLET | Freq: Every day | ORAL | Status: DC

## 2023-07-01 MED ORDER — HYDROXYZINE HCL 25 MG PO TABS
25.0000 mg | ORAL_TABLET | Freq: Four times a day (QID) | ORAL | Status: AC | PRN
  Administered 2023-07-03 – 2023-07-04 (×2): 25 mg
  Filled 2023-07-01 (×3): qty 1

## 2023-07-01 MED ORDER — MIDAZOLAM HCL 2 MG/2ML IJ SOLN
2.0000 mg | Freq: Once | INTRAMUSCULAR | Status: AC
  Administered 2023-07-01: 2 mg via INTRAVENOUS
  Filled 2023-07-01: qty 2

## 2023-07-01 MED ORDER — DEXTROSE 5 % IV SOLN
30.0000 mmol | Freq: Once | INTRAVENOUS | Status: AC
  Administered 2023-07-01: 30 mmol via INTRAVENOUS
  Filled 2023-07-01: qty 10

## 2023-07-01 MED ORDER — SENNOSIDES 8.8 MG/5ML PO SYRP: 5.0000 mL | ORAL_SOLUTION | Freq: Every evening | ORAL | Status: DC | PRN

## 2023-07-01 MED ORDER — PROSOURCE TF20 ENFIT COMPATIBL EN LIQD
60.0000 mL | Freq: Every day | ENTERAL | Status: DC
  Administered 2023-07-01 – 2023-07-10 (×10): 60 mL
  Filled 2023-07-01 (×11): qty 60

## 2023-07-01 MED ORDER — POTASSIUM PHOSPHATES 15 MMOLE/5ML IV SOLN: 30.0000 mmol | Freq: Once | INTRAVENOUS | Status: DC

## 2023-07-01 MED ORDER — CLONIDINE HCL 0.1 MG PO TABS
0.1000 mg | ORAL_TABLET | Freq: Four times a day (QID) | ORAL | Status: DC
  Administered 2023-07-01: 0.1 mg
  Filled 2023-07-01: qty 1

## 2023-07-01 MED ORDER — QUETIAPINE FUMARATE 100 MG PO TABS
100.0000 mg | ORAL_TABLET | Freq: Two times a day (BID) | ORAL | Status: DC
  Administered 2023-07-01 (×2): 100 mg
  Filled 2023-07-01 (×2): qty 1

## 2023-07-01 MED ORDER — CHLORDIAZEPOXIDE HCL 25 MG PO CAPS
25.0000 mg | ORAL_CAPSULE | Freq: Three times a day (TID) | ORAL | Status: AC
  Administered 2023-07-02 (×3): 25 mg
  Filled 2023-07-01 (×3): qty 1

## 2023-07-01 MED ORDER — SODIUM CHLORIDE 0.9% FLUSH
10.0000 mL | INTRAVENOUS | Status: DC | PRN
  Administered 2023-07-09 (×2): 10 mL via INTRAVENOUS

## 2023-07-01 MED ORDER — ADULT MULTIVITAMIN W/MINERALS CH
1.0000 | ORAL_TABLET | Freq: Every day | ORAL | Status: DC
  Administered 2023-07-01 – 2023-07-10 (×10): 1
  Filled 2023-07-01 (×9): qty 1

## 2023-07-01 MED ORDER — ORAL CARE MOUTH RINSE: 15.0000 mL | OROMUCOSAL | Status: DC | PRN

## 2023-07-01 MED ORDER — ONDANSETRON HCL 4 MG/2ML IJ SOLN: 4.0000 mg | Freq: Four times a day (QID) | INTRAMUSCULAR | Status: DC | PRN

## 2023-07-01 MED ORDER — CHLORDIAZEPOXIDE HCL 25 MG PO CAPS: 25.0000 mg | ORAL_CAPSULE | Freq: Every day | ORAL | Status: DC

## 2023-07-01 MED ORDER — ADULT MULTIVITAMIN W/MINERALS CH
1.0000 | ORAL_TABLET | Freq: Every day | ORAL | Status: DC
  Filled 2023-07-01: qty 1

## 2023-07-01 MED ORDER — CHLORDIAZEPOXIDE HCL 25 MG PO CAPS: 25.0000 mg | ORAL_CAPSULE | Freq: Three times a day (TID) | ORAL | Status: DC

## 2023-07-01 MED ORDER — THIAMINE HCL 100 MG/ML IJ SOLN
100.0000 mg | Freq: Every day | INTRAMUSCULAR | Status: DC
  Administered 2023-07-02 – 2023-07-06 (×3): 100 mg via INTRAVENOUS
  Filled 2023-07-01 (×4): qty 2

## 2023-07-01 MED ORDER — CHLORDIAZEPOXIDE HCL 25 MG PO CAPS
25.0000 mg | ORAL_CAPSULE | Freq: Every day | ORAL | Status: AC
  Administered 2023-07-04: 25 mg
  Filled 2023-07-01: qty 1

## 2023-07-01 MED ORDER — LOPERAMIDE HCL 1 MG/7.5ML PO SUSP: 2.0000 mg | ORAL | Status: DC | PRN

## 2023-07-01 MED ORDER — CHLORDIAZEPOXIDE HCL 25 MG PO CAPS
25.0000 mg | ORAL_CAPSULE | Freq: Four times a day (QID) | ORAL | Status: DC
  Administered 2023-07-01: 25 mg
  Filled 2023-07-01: qty 1

## 2023-07-01 MED ORDER — CLONIDINE HCL 0.1 MG PO TABS: 0.1000 mg | ORAL_TABLET | ORAL | Status: DC

## 2023-07-01 MED ORDER — DOCUSATE SODIUM 50 MG/5ML PO LIQD
50.0000 mg | Freq: Every evening | ORAL | Status: DC | PRN
  Administered 2023-07-01 – 2023-07-04 (×2): 50 mg
  Filled 2023-07-01 (×2): qty 10

## 2023-07-01 MED ORDER — CHLORDIAZEPOXIDE HCL 25 MG PO CAPS
25.0000 mg | ORAL_CAPSULE | Freq: Four times a day (QID) | ORAL | Status: DC | PRN
  Administered 2023-07-04: 25 mg
  Filled 2023-07-01: qty 1

## 2023-07-01 MED ORDER — CLONIDINE HCL 0.1 MG PO TABS
0.1000 mg | ORAL_TABLET | Freq: Four times a day (QID) | ORAL | Status: AC
  Administered 2023-07-01 – 2023-07-02 (×7): 0.1 mg
  Filled 2023-07-01 (×7): qty 1

## 2023-07-01 MED ORDER — CHLORDIAZEPOXIDE HCL 25 MG PO CAPS
50.0000 mg | ORAL_CAPSULE | Freq: Once | ORAL | Status: AC
  Administered 2023-07-01: 50 mg
  Filled 2023-07-01: qty 2

## 2023-07-01 MED ORDER — CHLORDIAZEPOXIDE HCL 25 MG PO CAPS
25.0000 mg | ORAL_CAPSULE | ORAL | Status: AC
  Administered 2023-07-03 (×2): 25 mg
  Filled 2023-07-01 (×2): qty 1

## 2023-07-01 MED ORDER — LOPERAMIDE HCL 2 MG PO CAPS: 2.0000 mg | ORAL_CAPSULE | ORAL | Status: DC | PRN

## 2023-07-01 NOTE — Progress Notes (Signed)
Received call from patients sister and mother stating that they are concerned about patients behavior and states that he will be "acting out more to get the medication that makes him feel good". I explained that we are trying to use alternative interventions  such as  decreasing stimulation and talking him down vs giving him medication to assist with outburst. CCM was made aware.  Beatrix Shipper

## 2023-07-01 NOTE — Progress Notes (Signed)
No void or unable to measure due to Pt pulled out purewick, incontinent, applied condome cath per prior shift. Bladder scan done, >656ml, , , notified e-link, I&o cath order received. IV infiltrated, atemptedx2 and IV team placed 2 PIV, meanwhile. Pt void dark amber colored urine. Remains confused, restless. Will cont. To monitor pt.

## 2023-07-01 NOTE — Progress Notes (Signed)
eLink Physician-Brief Progress Note Patient Name: Jermaine [name] DOB: 06/22/1965 MRN: [id number]   Date of Service  07/01/2023  HPI/Events of Note  Bladder scan 657, remains on Precedex with agitation.  RASS -3 to -1.  External urinary catheter in place.  eICU Interventions  Seery retention guidelines placed.  In-N-Out cath as needed.     Intervention Category Minor Interventions: Routine modifications to care plan (e.g. PRN medications for pain, fever)  Mouhamad Teed 07/01/2023, 8:46 PM

## 2023-07-01 NOTE — Progress Notes (Signed)
NAME:  Jermaine [name], MRN:  [id number], DOB:  06-04-1965, LOS: 6 ADMISSION DATE:  06/25/2023, CONSULTATION DATE:  06/26/23 REFERRING MD:  Jarvis Newcomer CHIEF COMPLAINT:  EtOH Withdrawal   BRIEF  Jermaine [name] is a 58 y.o. male who has a PMH as below including but not limited to anxiety, depression, EtOH dependence. He presented to urgent care 1/28 with confusion, tremors, anxiety, presyncopal symptoms. He was found to have tachycardia, tachypnea, hypoxia and was subsequently told to come to the ED for further evaluaiton.  In ED, he had persistent symptoms. He informed staff that his last drink was earlier that day. He normally drinks 1 - 2 pints Titos every day or every other day along with 2 - 4 White Claws. On day of presentation, he believes he only drank [employer] and no liquor.  He was found to have ethanol of 283 and despite Ativan and Versed, continued to have symptoms. He was subsequently admitted by [facility] for EtOH withdrawal and was started on Precedex and Ativan per CIWA. Head CT and UDS negative. He denies any recreational drug use. He has had withdrawal in the past requiring hospitalization. Appears just admitted to this facility 05/20/23 through 05/24/23 for the same as well as suicidal/homicidal ideation. He was IVC'd at the time and was seen by psych who cleared him for discharge. Per chart review, he has had multiple admissions for the same with 5 admissions in the last 6 months.  On PM 1/29, he remained on 0.4 Precedex; therefore, PCCM asked to assist with his care. He is A&O x 3, no deficits. He had some hallucinations this AM but currently is lucid and appropriately interactive in conversation. He tells me that he has a job interview tomorrow (Thursday 1/30) which he would really like to not miss. I informed him that he would not be discharged by then and recommended he call the employer and request a delay in his interview.  Pertinent  Medical History:  has Alcohol abuse with intoxication  (HCC); Alcohol abuse; Hepatic steatosis; Anxiety and depression; Sinus tachycardia; Class 1 obesity; Secondary polycythemia; DTs (delirium tremens) (HCC); Alcohol withdrawal (HCC); Facial fracture due to fall [facility]); Orbital floor (blow-out) closed fracture (HCC); Alcohol-induced mood disorder (HCC); C. difficile colitis; and Acute hypoxic respiratory failure (HCC) on their problem list.  Significant Hospital Events: Including procedures, antibiotic start and stop dates in addition to other pertinent events   1/28 admit 1/29 PCCM consult 1/31 - Unable to wean precedex thus far, up to 1.8 dex this am.  Either sedated or agitated, swinging at staff this am 2/1 - 06/29/2023: Required Haldol and even additional phenobarb.  This is despite being on Precedex infusion.  Currently afebrile with a normal white count.  Intermittent agitation overnight.  Continued on Precedex getting scheduled Haldol 2/2: nasogastric tube placed. Focus changed to treating NARD over DT. Added scheduled seroquel, clonidine taper and librium taper. Allowing pheno to taper off on current schedule    Interim History / Subjective:   Currently sedated but is very agitated combative with lower dosing of Precedex Objective:  Blood pressure 119/70, pulse 70, temperature 98.5 F (36.9 C), temperature source Axillary, resp. rate 19, height 5\' 10"  (1.778 m), weight 88.2 kg, SpO2 99%.        Intake/Output Summary (Last 24 hours) at 07/01/2023 0857 Last data filed at 07/01/2023 0558 Gross per 24 hour  Intake 1662.15 ml  Output 1650 ml  Net 12.15 ml   Filed Weights   06/25/23 1806  06/30/23 0354 07/01/23 0547  Weight: 90.7 kg 89.9 kg 88.2 kg    Examination: General: 58 year old male patient lying in bed currently sedated on Precedex infusion HEENT normocephalic atraumatic no jugular venous distention does have rhonchorous upper airway noises Pulmonary some scattered rhonchi currently 2 L/min nasal cannula no accessory use Cardiac  regular rate and rhythm without murmur rub or gallop Abdomen soft nontender no organomegaly bowel sounds are present Extremities warm dry brisk cap no edema Neuro: Currently sedated, moves all extremities.  When sedation is lowered becomes combative Gu cl yellow    Labs/imaging personally reviewed:  CT head 1/29 > neg.  Assessment & Plan:   Acute alcohol withdrawal with DTs and now ongoing Acute metabolic encephalopathy due to nonalcohol related delirium (NARD)  Looks like withdrawal started around the 28th, last drink possibly 24 to 48 hours prior has been treated aggressively with multiple benzodiazepines as well as phenobarb taper at this point in his hospitalization getting out of window for withdrawal more likely alcohol related delirium at this point Plan Will continue current phenobarb taper, at this point I do not think additional phenobarbital would be helpful, he has had adequate GABA coverage Scheduled thiamine and folate Placing nasogastric tube so we can schedule Seroquel Add Librium taper & add dec PRN ativan  Add Clonidine taper Wean Precedex need RASS -1  Acute hypoxic respiratory failure Desaturated off oxygen. Very rhonchus on exam w/ marginal airway protection  Plan Repeating chest x-ray today Try to limit sedation so that he can better protect airway and participate in spirometry Continue pulse oximetry Supplemental oxygen for saturation greater than 92% Close observation, in case airway protection continues to be a concern  Fluid and electrolyte imbalance: Hypophosphatemia, and borderline hypokalemia Plan Replace and follow  Query undiagnosed OSA? Plan Consider outpt f/u   Best practice (evaluated daily):  Diet/type: NPO DVT prophylaxis: LMWH Pressure ulcer(s): pressure ulcer assessment deferred  GI prophylaxis: N/A Lines: N/A Foley:  N/A Code Status:  full code Last date of multidisciplinary goals of care discussion: pending 1/31  My time 33 min

## 2023-07-01 NOTE — Progress Notes (Signed)
Initial Nutrition Assessment  INTERVENTION:   Monitor magnesium, potassium, and phosphorus  for at least 3 days, MD to replete as needed, as pt is at risk for refeeding syndrome.  -Continue Thiamine 100 mg daily   -Initiate Osmolite 1.5 @ 20 ml/hr, advance by 10 ml every 12 hours to goal rate of 60 ml/hr. -60 ml Prosource TF 20 daily -Provides at goal: 2240 kcals, 110g protein and 1097 ml H2O  -Given alcohol abuse, will check vitamin labs in AM: Thiamine, Vitamin B6 and B12  -Continue vitamins via CIWA  NUTRITION DIAGNOSIS:   Increased nutrient needs related to chronic illness as evidenced by estimated needs.  GOAL:   Patient will meet greater than or equal to 90% of their needs  MONITOR:   TF tolerance  REASON FOR ASSESSMENT:   Consult Enteral/tube feeding initiation and management  ASSESSMENT:   58 y.o. male who has a PMH as below including but not limited to anxiety, depression, EtOH dependence. He presented to urgent care 1/28 with confusion, tremors, anxiety, presyncopal symptoms.  Patient in room, in restraints, sedated. Was unable to give a history at time of visit.  Pt just had small bore NGT placed, awaiting x-ray verification of placement. RD received consult to start tube feeds from CCM. Have placed order for Osmolite 1.5 @ 20 to advance slowly towards goal of 60 ml/hr. Messaged RN regarding TF order.  Pt at risk of refeeding syndrome given alcohol abuse PTA. Pt had reported intake to CCM, 1-2 pints of Titos vodka and 2-4 White claws daily. Pt was started on CIWA, receiving MVI and IV Thiamine. Have changed MVI to per tube. Will check B vitamin labs in AM.   Per weight records, pt's weight has decreased since admission 1/28.  I/Os: +2.1L since admit  Medications: Folic acid, Multivitamin with minerals daily, Thiamine, Precedex, K-Phos  Labs reviewed: CBGs: 99-104 Low Phos   NUTRITION - FOCUSED PHYSICAL EXAM:  Flowsheet Row Most Recent Value  Orbital  Region No depletion  Upper Arm Region No depletion  Thoracic and Lumbar Region Unable to assess  Buccal Region No depletion  Temple Region Mild depletion  Clavicle Bone Region No depletion  Clavicle and Acromion Bone Region No depletion  Scapular Bone Region Unable to assess  Dorsal Hand Unable to assess mitts  Patellar Region Unable to assess  Anterior Thigh Region Unable to assess  Posterior Calf Region Unable to assess  Edema (RD Assessment) None  Hair Reviewed  Eyes Unable to assess  [kept eyes closed]  Mouth Unable to assess  Skin Reviewed  Nails Unable to assess       Diet Order:   Diet Order             Diet NPO time specified  Diet effective now                   EDUCATION NEEDS:   Not appropriate for education at this time  Skin:  Skin Assessment: Reviewed RN Assessment  Last BM:  1/31 -type 7  Height:   Ht Readings from Last 1 Encounters:  06/25/23 5\' 10"  (1.778 m)    Weight:   Wt Readings from Last 1 Encounters:  07/01/23 88.2 kg    BMI:  Body mass index is 27.9 kg/m.  Estimated Nutritional Needs:   Kcal:  2200-2400  Protein:  100-115g  Fluid:  2.2L/day   Tilda Franco, MS, RD, LDN Inpatient Clinical Dietitian Contact via Secure chat

## 2023-07-02 DIAGNOSIS — G9341 Metabolic encephalopathy: Secondary | ICD-10-CM | POA: Diagnosis not present

## 2023-07-02 DIAGNOSIS — F10231 Alcohol dependence with withdrawal delirium: Secondary | ICD-10-CM | POA: Diagnosis not present

## 2023-07-02 DIAGNOSIS — Z1152 Encounter for screening for COVID-19: Secondary | ICD-10-CM | POA: Diagnosis not present

## 2023-07-02 DIAGNOSIS — J9601 Acute respiratory failure with hypoxia: Secondary | ICD-10-CM | POA: Diagnosis not present

## 2023-07-02 DIAGNOSIS — F10931 Alcohol use, unspecified with withdrawal delirium: Secondary | ICD-10-CM | POA: Diagnosis not present

## 2023-07-02 LAB — CBC
HCT: 49.2 % (ref 39.0–52.0)
Hemoglobin: 16.3 g/dL (ref 13.0–17.0)
MCH: 31.5 pg (ref 26.0–34.0)
MCHC: 33.1 g/dL (ref 30.0–36.0)
MCV: 95 fL (ref 80.0–100.0)
Platelets: 154 10*3/uL (ref 150–400)
RBC: 5.18 MIL/uL (ref 4.22–5.81)
RDW: 12.4 % (ref 11.5–15.5)
WBC: 6.9 10*3/uL (ref 4.0–10.5)
nRBC: 0 % (ref 0.0–0.2)

## 2023-07-02 LAB — PHOSPHORUS
Phosphorus: 2.6 mg/dL (ref 2.5–4.6)
Phosphorus: 3.4 mg/dL (ref 2.5–4.6)

## 2023-07-02 LAB — BASIC METABOLIC PANEL
Anion gap: 10 (ref 5–15)
BUN: 10 mg/dL (ref 6–20)
CO2: 27 mmol/L (ref 22–32)
Calcium: 8.7 mg/dL — ABNORMAL LOW (ref 8.9–10.3)
Chloride: 101 mmol/L (ref 98–111)
Creatinine, Ser: 0.8 mg/dL (ref 0.61–1.24)
GFR, Estimated: >60 mL/min (ref >60)
Glucose, Bld: 112 mg/dL — ABNORMAL HIGH (ref 70–99)
Potassium: 3.4 mmol/L — ABNORMAL LOW (ref 3.5–5.1)
Sodium: 138 mmol/L (ref 135–145)

## 2023-07-02 LAB — GLUCOSE, CAPILLARY
Glucose-Capillary: 105 mg/dL — ABNORMAL HIGH (ref 70–99)
Glucose-Capillary: 105 mg/dL — ABNORMAL HIGH (ref 70–99)
Glucose-Capillary: 110 mg/dL — ABNORMAL HIGH (ref 70–99)
Glucose-Capillary: 113 mg/dL — ABNORMAL HIGH (ref 70–99)
Glucose-Capillary: 128 mg/dL — ABNORMAL HIGH (ref 70–99)
Glucose-Capillary: 89 mg/dL (ref 70–99)

## 2023-07-02 LAB — MAGNESIUM
Magnesium: 2.1 mg/dL (ref 1.7–2.4)
Magnesium: 2.1 mg/dL (ref 1.7–2.4)

## 2023-07-02 LAB — VITAMIN B12: Vitamin B-12: 816 pg/mL (ref 180–914)

## 2023-07-02 MED ORDER — LACTULOSE 10 GM/15ML PO SOLN
10.0000 g | Freq: Two times a day (BID) | ORAL | Status: DC | PRN
  Administered 2023-07-03: 10 g
  Filled 2023-07-02: qty 15

## 2023-07-02 MED ORDER — POLYETHYLENE GLYCOL 3350 17 G PO PACK
17.0000 g | PACK | Freq: Every day | ORAL | Status: DC
  Administered 2023-07-02 – 2023-07-07 (×4): 17 g
  Filled 2023-07-02 (×6): qty 1

## 2023-07-02 MED ORDER — POTASSIUM CHLORIDE 20 MEQ PO PACK
40.0000 meq | PACK | Freq: Once | ORAL | Status: AC
  Administered 2023-07-02: 40 meq
  Filled 2023-07-02: qty 2

## 2023-07-02 MED ORDER — QUETIAPINE FUMARATE 100 MG PO TABS
200.0000 mg | ORAL_TABLET | Freq: Two times a day (BID) | ORAL | Status: DC
  Administered 2023-07-02 – 2023-07-05 (×7): 200 mg
  Filled 2023-07-02 (×7): qty 2

## 2023-07-02 NOTE — Progress Notes (Signed)
 NAME:  Jermaine [name], MRN:  [id number], DOB:  1965-07-15, LOS: 7 ADMISSION DATE:  06/25/2023, CONSULTATION DATE:  06/26/23 REFERRING MD:  Bryn CHIEF COMPLAINT:  EtOH Withdrawal   BRIEF  Jermaine [name] is a 58 y.o. male who has a PMH as below including but not limited to anxiety, depression, EtOH dependence. He presented to urgent care 1/28 with confusion, tremors, anxiety, presyncopal symptoms. He was found to have tachycardia, tachypnea, hypoxia and was subsequently told to come to the ED for further evaluaiton.  In ED, he had persistent symptoms. He informed staff that his last drink was earlier that day. He normally drinks 1 - 2 pints Titos every day or every other day along with 2 - 4 White Claws. On day of presentation, he believes he only drank [employer] and no liquor.  He was found to have ethanol of 283 and despite Ativan  and Versed , continued to have symptoms. He was subsequently admitted by [facility] for EtOH withdrawal and was started on Precedex  and Ativan  per CIWA. Head CT and UDS negative. He denies any recreational drug use. He has had withdrawal in the past requiring hospitalization. Appears just admitted to this facility 05/20/23 through 05/24/23 for the same as well as suicidal/homicidal ideation. He was IVC'd at the time and was seen by psych who cleared him for discharge. Per chart review, he has had multiple admissions for the same with 5 admissions in the last 6 months.  On PM 1/29, he remained on 0.4 Precedex ; therefore, PCCM asked to assist with his care. He is A&O x 3, no deficits. He had some hallucinations this AM but currently is lucid and appropriately interactive in conversation. He tells me that he has a job interview tomorrow (Thursday 1/30) which he would really like to not miss. I informed him that he would not be discharged by then and recommended he call the employer and request a delay in his interview.  Pertinent  Medical History:  has Alcohol  abuse with intoxication  (HCC); Alcohol  abuse; Hepatic steatosis; Anxiety and depression; Sinus tachycardia; Class 1 obesity; Secondary polycythemia; DTs (delirium tremens) (HCC); Alcohol  withdrawal (HCC); Facial fracture due to fall [facility]); Orbital floor (blow-out) closed fracture (HCC); Alcohol -induced mood disorder (HCC); C. difficile colitis; and Acute hypoxic respiratory failure (HCC) on their problem list.  Significant Hospital Events: Including procedures, antibiotic start and stop dates in addition to other pertinent events   1/28 admit 1/29 PCCM consult 1/31 - Unable to wean precedex  thus far, up to 1.8 dex this am.  Either sedated or agitated, swinging at staff this am 2/1 - 06/29/2023: Required Haldol  and even additional phenobarb.  This is despite being on Precedex  infusion.  Currently afebrile with a normal white count.  Intermittent agitation overnight.  Continued on Precedex  getting scheduled Haldol  2/3: nasogastric tube placed. Focus changed to treating NARD over DT. Added scheduled seroquel , clonidine  taper and librium  taper. Allowing pheno to taper off on current schedule initially was weaned off from the Precedex , but then got agitated had once again was rapidly titrated back up to ceiling dose 2/4 back on dex. Still agitated at times, kicked nursing staff during the evening hours of 2/3 Seroquel  increased Interim History / Subjective:   Back on high-dose Seroquel  sedated currently Objective:  Blood pressure (!) 154/91, pulse 73, temperature 99.1 F (37.3 C), temperature source Oral, resp. rate 17, height 5' 10 (1.778 m), weight 90.8 kg, SpO2 99%.        Intake/Output Summary (Last 24 hours)  at 07/02/2023 0809 Last data filed at 07/02/2023 9291 Gross per 24 hour  Intake 2637.8 ml  Output 1400 ml  Net 1237.8 ml   Filed Weights   06/30/23 0354 07/01/23 0547 07/02/23 0402  Weight: 89.9 kg 88.2 kg 90.8 kg    Examination:  58 year old white male lying in bed currently sedated on Precedex  but working  on weaning HEENT normocephalic atraumatic he has a small bore feeding tube in place pupils equal reactive Pulmonary rhonchorous respiratory effort, no accessory use, some snoring.  Remains on 2 L Cardiac regular rate and rhythm Abdomen soft not tender Extremities are warm and dry with brisk capillary refill trace dependent edema Neuro currently heavily sedated on Precedex , he does wake up quite abruptly and violent, apparently kicked staff last night.  Labs/imaging personally reviewed:  CT head 1/29 > neg.  Assessment & Plan:   Acute alcohol  withdrawal with DTs and now ongoing Acute metabolic encephalopathy due to nonalcohol related delirium (NARD)  Looks like withdrawal started around the 28th, last drink possibly 24 to 48 hours prior has been treated aggressively with multiple benzodiazepines as well as phenobarb taper at this point in his hospitalization getting out of window for withdrawal more likely alcohol  related delirium at this point Plan Will continue current phenobarb taper, this will be completed on 2/5 If we are still requiring Precedex  on 2/5 might be reasonable to add valproic  acid for further delirium support  Scheduled thiamine  and folate Continue the Librium  and clonidine  taper Increasing his Seroquel  dosing, his QTc is 424, will need to continue to monitor this Continue to wean precedex , I am hopeful with the increasing Seroquel  dosing we can get him off from this and keep him off Sedation of his agitation  Acute hypoxic respiratory failure Desaturated off oxygen.  CXR low vol. No clear infiltrate Plan Cont supplemental oxygen for sats > 92% NPO, cont TFs Try to limit sedation so that he can better protect airway and participate in spirometry Continue pulse oximetry  Fluid and electrolyte imbalance: Hypokalemia Plan Replace and recheck  Query undiagnosed OSA? Plan Consider outpt f/u   Best practice (evaluated daily):  Diet/type: NPO DVT prophylaxis:  LMWH Pressure ulcer(s): pressure ulcer assessment deferred  GI prophylaxis: N/A Lines: N/A Foley:  N/A Code Status:  full code Last date of multidisciplinary goals of care discussion: pending 1/31  My time 32 min

## 2023-07-02 NOTE — Plan of Care (Signed)

## 2023-07-02 NOTE — Plan of Care (Signed)
  Problem: Education: Goal: Knowledge of General Education information will improve Description: Including pain rating scale, medication(s)/side effects and non-pharmacologic comfort measures Outcome: Not Progressing   Problem: Clinical Measurements: Goal: Ability to maintain clinical measurements within normal limits will improve Outcome: Not Progressing   Problem: Nutrition: Goal: Adequate nutrition will be maintained Outcome: Progressing   Problem: Safety: Goal: Ability to remain free from injury will improve Outcome: Not Progressing

## 2023-07-03 DIAGNOSIS — G9341 Metabolic encephalopathy: Secondary | ICD-10-CM | POA: Diagnosis not present

## 2023-07-03 DIAGNOSIS — J9601 Acute respiratory failure with hypoxia: Secondary | ICD-10-CM | POA: Diagnosis not present

## 2023-07-03 DIAGNOSIS — Z1152 Encounter for screening for COVID-19: Secondary | ICD-10-CM | POA: Diagnosis not present

## 2023-07-03 DIAGNOSIS — F10931 Alcohol use, unspecified with withdrawal delirium: Secondary | ICD-10-CM | POA: Diagnosis not present

## 2023-07-03 DIAGNOSIS — F10231 Alcohol dependence with withdrawal delirium: Secondary | ICD-10-CM | POA: Diagnosis not present

## 2023-07-03 LAB — COMPREHENSIVE METABOLIC PANEL
ALT: 15 U/L (ref 0–44)
AST: 17 U/L (ref 15–41)
Albumin: 3.3 g/dL — ABNORMAL LOW (ref 3.5–5.0)
Alkaline Phosphatase: 44 U/L (ref 38–126)
Anion gap: 8 (ref 5–15)
BUN: 13 mg/dL (ref 6–20)
CO2: 28 mmol/L (ref 22–32)
Calcium: 8.8 mg/dL — ABNORMAL LOW (ref 8.9–10.3)
Chloride: 102 mmol/L (ref 98–111)
Creatinine, Ser: 0.69 mg/dL (ref 0.61–1.24)
GFR, Estimated: >60 mL/min (ref >60)
Glucose, Bld: 128 mg/dL — ABNORMAL HIGH (ref 70–99)
Potassium: 4 mmol/L (ref 3.5–5.1)
Sodium: 138 mmol/L (ref 135–145)
Total Bilirubin: 0.8 mg/dL (ref 0.0–1.2)
Total Protein: 6.4 g/dL — ABNORMAL LOW (ref 6.5–8.1)

## 2023-07-03 LAB — CBC
HCT: 46.8 % (ref 39.0–52.0)
Hemoglobin: 15 g/dL (ref 13.0–17.0)
MCH: 31.3 pg (ref 26.0–34.0)
MCHC: 32.1 g/dL (ref 30.0–36.0)
MCV: 97.5 fL (ref 80.0–100.0)
Platelets: 178 10*3/uL (ref 150–400)
RBC: 4.8 MIL/uL (ref 4.22–5.81)
RDW: 12.5 % (ref 11.5–15.5)
WBC: 7.6 10*3/uL (ref 4.0–10.5)
nRBC: 0 % (ref 0.0–0.2)

## 2023-07-03 LAB — GLUCOSE, CAPILLARY
Glucose-Capillary: 127 mg/dL — ABNORMAL HIGH (ref 70–99)
Glucose-Capillary: 91 mg/dL (ref 70–99)
Glucose-Capillary: 137 mg/dL — ABNORMAL HIGH (ref 70–99)
Glucose-Capillary: 128 mg/dL — ABNORMAL HIGH (ref 70–99)
Glucose-Capillary: 132 mg/dL — ABNORMAL HIGH (ref 70–99)
Glucose-Capillary: 84 mg/dL (ref 70–99)

## 2023-07-03 MED ORDER — ORAL CARE MOUTH RINSE
15.0000 mL | OROMUCOSAL | Status: DC
  Administered 2023-07-03 – 2023-07-05 (×7): 15 mL via OROMUCOSAL

## 2023-07-03 MED ORDER — LACTULOSE 10 GM/15ML PO SOLN
10.0000 g | Freq: Two times a day (BID) | ORAL | Status: DC
  Administered 2023-07-03 – 2023-07-09 (×12): 10 g
  Filled 2023-07-03 (×15): qty 15

## 2023-07-03 MED ORDER — LORAZEPAM 1 MG PO TABS
2.0000 mg | ORAL_TABLET | Freq: Three times a day (TID) | ORAL | Status: DC
  Administered 2023-07-03 – 2023-07-08 (×17): 2 mg
  Filled 2023-07-03 (×17): qty 2

## 2023-07-03 MED ORDER — VALPROIC ACID 250 MG/5ML PO SOLN: 250.0000 mg | Freq: Two times a day (BID) | ORAL | Status: DC

## 2023-07-03 MED ORDER — VALPROIC ACID 250 MG/5ML PO SOLN
125.0000 mg | Freq: Two times a day (BID) | ORAL | Status: DC
  Filled 2023-07-03: qty 5

## 2023-07-03 MED ORDER — OLANZAPINE 5 MG PO TBDP
5.0000 mg | ORAL_TABLET | Freq: Two times a day (BID) | ORAL | Status: DC
  Administered 2023-07-03: 5 mg
  Filled 2023-07-03: qty 1

## 2023-07-03 MED ORDER — VALPROIC ACID 250 MG/5ML PO SOLN
250.0000 mg | Freq: Three times a day (TID) | ORAL | Status: DC
  Filled 2023-07-03: qty 5

## 2023-07-03 MED ORDER — ORAL CARE MOUTH RINSE: 15.0000 mL | OROMUCOSAL | Status: DC | PRN

## 2023-07-03 MED ORDER — VALPROIC ACID 250 MG/5ML PO SOLN
125.0000 mg | Freq: Three times a day (TID) | ORAL | Status: DC
  Administered 2023-07-03 (×2): 125 mg
  Filled 2023-07-03 (×3): qty 5

## 2023-07-03 MED ORDER — VALPROIC ACID 250 MG/5ML PO SOLN
125.0000 mg | Freq: Once | ORAL | Status: DC
Start: 2023-07-03 — End: 2023-07-03
  Filled 2023-07-03: qty 5

## 2023-07-03 NOTE — Progress Notes (Signed)
 eLink Physician-Brief Progress Note Patient Name: Jermaine [name] DOB: 01-24-1966 MRN: [id number]   Date of Service  07/03/2023  HPI/Events of Note  Patient needs restraints order renewed for patient safety.  eICU Interventions  Restraints order entered.        Marcellina PENNER Zavier Canela 07/03/2023, 1:07 AM

## 2023-07-03 NOTE — Progress Notes (Addendum)
 NAME:  Jermaine [name], MRN:  [id number], DOB:  10/08/1965, LOS: 8 ADMISSION DATE:  06/25/2023, CONSULTATION DATE:  06/26/23 REFERRING MD:  Bryn CHIEF COMPLAINT:  EtOH Withdrawal   BRIEF  Jermaine [name] is a 58 y.o. male who has a PMH as below including but not limited to anxiety, depression, EtOH dependence. He presented to urgent care 1/28 with confusion, tremors, anxiety, presyncopal symptoms. He was found to have tachycardia, tachypnea, hypoxia and was subsequently told to come to the ED for further evaluaiton.  In ED, he had persistent symptoms. He informed staff that his last drink was earlier that day. He normally drinks 1 - 2 pints Titos every day or every other day along with 2 - 4 White Claws. On day of presentation, he believes he only drank [employer] and no liquor.  He was found to have ethanol of 283 and despite Ativan  and Versed , continued to have symptoms. He was subsequently admitted by [facility] for EtOH withdrawal and was started on Precedex  and Ativan  per CIWA. Head CT and UDS negative. He denies any recreational drug use. He has had withdrawal in the past requiring hospitalization. Appears just admitted to this facility 05/20/23 through 05/24/23 for the same as well as suicidal/homicidal ideation. He was IVC'd at the time and was seen by psych who cleared him for discharge. Per chart review, he has had multiple admissions for the same with 5 admissions in the last 6 months.  On PM 1/29, he remained on 0.4 Precedex ; therefore, PCCM asked to assist with his care. He is A&O x 3, no deficits. He had some hallucinations this AM but currently is lucid and appropriately interactive in conversation. He tells me that he has a job interview tomorrow (Thursday 1/30) which he would really like to not miss. I informed him that he would not be discharged by then and recommended he call the employer and request a delay in his interview.  Pertinent  Medical History:  has Alcohol  abuse with intoxication  (HCC); Alcohol  abuse; Hepatic steatosis; Anxiety and depression; Sinus tachycardia; Class 1 obesity; Secondary polycythemia; DTs (delirium tremens) (HCC); Alcohol  withdrawal (HCC); Facial fracture due to fall [facility]); Orbital floor (blow-out) closed fracture (HCC); Alcohol -induced mood disorder (HCC); C. difficile colitis; and Acute hypoxic respiratory failure (HCC) on their problem list.  Significant Hospital Events: Including procedures, antibiotic start and stop dates in addition to other pertinent events   1/28 admit 1/29 PCCM consult 1/31 - Unable to wean precedex  thus far, up to 1.8 dex this am.  Either sedated or agitated, swinging at staff this am 2/1 - 06/29/2023: Required Haldol  and even additional phenobarb.  This is despite being on Precedex  infusion.  Currently afebrile with a normal white count.  Intermittent agitation overnight.  Continued on Precedex  getting scheduled Haldol  2/3: nasogastric tube placed. Focus changed to treating NARD over DT. Added scheduled seroquel , clonidine  taper and librium  taper. Allowing pheno to taper off on current schedule initially was weaned off from the Precedex , but then got agitated had once again was rapidly titrated back up to ceiling dose 2/4 back on dex. Still agitated at times, kicked nursing staff during the evening hours of 2/3 Seroquel  increased  Interim History / Subjective:  Precedex  needs improved overnight, remains on 1.2.  Following more commands, remains confused  Objective:  Blood pressure 110/74, pulse 66, temperature 98.2 F (36.8 C), temperature source Axillary, resp. rate 20, height 5' 10 (1.778 m), weight 85.2 kg, SpO2 98%.  Intake/Output Summary (Last 24 hours) at 07/03/2023 0916 Last data filed at 07/03/2023 0730 Gross per 24 hour  Intake 2097.31 ml  Output 1700 ml  Net 397.31 ml   Filed Weights   07/01/23 0547 07/02/23 0402 07/03/23 0500  Weight: 88.2 kg 90.8 kg 85.2 kg    Examination: Dex 1.2 General:  Adult  male lying in bed in NAD HEENT: MM pink/moist, pupils 3/r, anicteric, cortrak left nare  Neuro:  responds to verbal, can but does not open eyes much, oriented to self, calm currently, will follow simple commands and MAE, currently remains in 4 pt restraints CV: rr, no murmur PULM:  non labored, clear anteriorly, more shallow/ diminished, better with deep breath, good NP cough, [place] 100% on 3L weaned to 1L GI: soft, NT/ ND, hyperBS, purwick Extremities: warm/dry, no LE edema  Skin: no rashes    Labs/imaging personally reviewed:  CT head 1/29 > neg.  Assessment & Plan:   Acute alcohol  withdrawal with DTs and now ongoing Acute metabolic encephalopathy due to nonalcohol related delirium (NARD)  Looks like withdrawal started around the 28th, last drink possibly 24 to 48 hours prior has been treated aggressively with multiple benzodiazepines as well as phenobarb taper.   Plan - remains on dex 1.2 still.  Adding VPA today to help wean further - phenobarb taper completes 2/6 - cont scheduled ativan  1mg  q 4 scheduled for now - cont librium  taper and clonidine  taper (unable to increase due to softer BP at times - seroquel  200mg  BID> monitor Qtc> 0.43 today - hydroxyzine  prn  - prn ativan  for severe agitation/ seizure - empiric thiamine / folic/ MVI - try and get safety sitter, minimize restraints  - cont NPO for now till mental status improved, TF per cortrak - monitor for urinary retention  Acute hypoxic respiratory failure Desaturated off oxygen.  CXR low vol. No clear infiltrate, suspect atelectasis Plan Cont supplemental oxygen for sats > 92% NPO, cont TFs Encourage pulm hygiene as able to continue to follow commands Continue pulse oximetry  Fluid and electrolyte imbalance: Hypokalemia Plan - check BMET  Query undiagnosed OSA? Plan Consider outpt f/u  At risk for malnutrition - cont TF per ED till mental status able to take POs consistently  - start lactulose  BID for BM, ?BM on  1/3  Best practice (evaluated daily):  Diet/type: NPO DVT prophylaxis: LMWH Pressure ulcer(s): pressure ulcer assessment deferred > n/a GI prophylaxis: N/A Lines: N/A Foley:  N/A Code Status:  full code Last date of multidisciplinary goals of care discussion: pending 1/31  My time 33 min     Lyle Pesa, MSN, AG-ACNP-BC [facility] 07/03/2023, 9:16 AM  See Amion for pager If no response to pager , please call [phone] until 7pm After 7:00 pm call Elink  [phone]

## 2023-07-04 DIAGNOSIS — F10931 Alcohol use, unspecified with withdrawal delirium: Secondary | ICD-10-CM | POA: Diagnosis not present

## 2023-07-04 DIAGNOSIS — F32A Depression, unspecified: Secondary | ICD-10-CM

## 2023-07-04 DIAGNOSIS — F05 Delirium due to known physiological condition: Secondary | ICD-10-CM

## 2023-07-04 DIAGNOSIS — J9601 Acute respiratory failure with hypoxia: Secondary | ICD-10-CM | POA: Diagnosis not present

## 2023-07-04 DIAGNOSIS — G9341 Metabolic encephalopathy: Secondary | ICD-10-CM | POA: Diagnosis not present

## 2023-07-04 DIAGNOSIS — Z1152 Encounter for screening for COVID-19: Secondary | ICD-10-CM | POA: Diagnosis not present

## 2023-07-04 DIAGNOSIS — F10932 Alcohol use, unspecified with withdrawal with perceptual disturbance: Secondary | ICD-10-CM | POA: Diagnosis not present

## 2023-07-04 DIAGNOSIS — F10231 Alcohol dependence with withdrawal delirium: Secondary | ICD-10-CM | POA: Diagnosis not present

## 2023-07-04 LAB — GLUCOSE, CAPILLARY
Glucose-Capillary: 110 mg/dL — ABNORMAL HIGH (ref 70–99)
Glucose-Capillary: 127 mg/dL — ABNORMAL HIGH (ref 70–99)
Glucose-Capillary: 91 mg/dL (ref 70–99)
Glucose-Capillary: 131 mg/dL — ABNORMAL HIGH (ref 70–99)
Glucose-Capillary: 125 mg/dL — ABNORMAL HIGH (ref 70–99)

## 2023-07-04 LAB — BASIC METABOLIC PANEL
Anion gap: 10 (ref 5–15)
BUN: 15 mg/dL (ref 6–20)
CO2: 28 mmol/L (ref 22–32)
Calcium: 9.2 mg/dL (ref 8.9–10.3)
Chloride: 98 mmol/L (ref 98–111)
Creatinine, Ser: 0.89 mg/dL (ref 0.61–1.24)
GFR, Estimated: >60 mL/min (ref >60)
Glucose, Bld: 155 mg/dL — ABNORMAL HIGH (ref 70–99)
Potassium: 4 mmol/L (ref 3.5–5.1)
Sodium: 136 mmol/L (ref 135–145)

## 2023-07-04 LAB — VITAMIN B6: Vitamin B6: 11.1 ug/L (ref 3.4–65.2)

## 2023-07-04 LAB — VITAMIN B1: Vitamin B1 (Thiamine): 195.5 nmol/L (ref 66.5–200.0)

## 2023-07-04 MED ORDER — LORAZEPAM 2 MG/ML IJ SOLN
2.0000 mg | Freq: Once | INTRAMUSCULAR | Status: AC
  Administered 2023-07-04: 2 mg via INTRAVENOUS
  Filled 2023-07-04: qty 1

## 2023-07-04 MED ORDER — DIVALPROEX SODIUM 125 MG PO CSDR
500.0000 mg | DELAYED_RELEASE_CAPSULE | Freq: Two times a day (BID) | ORAL | Status: DC
Start: 2023-07-04 — End: 2023-07-04

## 2023-07-04 MED ORDER — VALPROIC ACID 250 MG/5ML PO SOLN
500.0000 mg | Freq: Two times a day (BID) | ORAL | Status: DC
Start: 2023-07-04 — End: 2023-07-10
  Administered 2023-07-04 – 2023-07-10 (×12): 500 mg
  Filled 2023-07-04 (×12): qty 10

## 2023-07-04 MED ORDER — MELATONIN 3 MG PO TABS
3.0000 mg | ORAL_TABLET | Freq: Every day | ORAL | Status: DC
  Administered 2023-07-04 – 2023-07-09 (×6): 3 mg
  Filled 2023-07-04 (×6): qty 1

## 2023-07-04 MED ORDER — HYDROXYZINE HCL 25 MG PO TABS
25.0000 mg | ORAL_TABLET | Freq: Four times a day (QID) | ORAL | Status: AC | PRN
  Administered 2023-07-04 – 2023-07-06 (×2): 25 mg
  Filled 2023-07-04: qty 1

## 2023-07-04 MED ORDER — BISACODYL 10 MG RE SUPP: 10.0000 mg | Freq: Every day | RECTAL | Status: DC | PRN

## 2023-07-04 MED ORDER — OLANZAPINE 5 MG PO TBDP
10.0000 mg | ORAL_TABLET | Freq: Two times a day (BID) | ORAL | Status: DC
  Administered 2023-07-04 – 2023-07-05 (×3): 10 mg
  Filled 2023-07-04 (×3): qty 2

## 2023-07-04 NOTE — Consult Note (Signed)
 Jermaine [name] Psychiatry Consult Evaluation  Service Date: July 04, 2023 LOS:  LOS: 9 days    Primary Psychiatric Diagnoses  Hyperactive delirium 2.  Alcohol  use disorder 3.  Depression  Assessment  Jermaine [name] is a 58 y.o. male admitted medically for 06/25/2023  5:56 PM for alcohol  withdrawal. He carries the psychiatric diagnoses of alcohol  use disorder, depression and anxiety. Psychiatry was consulted for management of alcohol  withdrawal and agitation.  Per chart, the patient was admitted on 1/28   His BAL was 283 on admission, and was given Ativan  and Versed  with minimal improvement in withdrawal.  He was admitted and was started on precedex , and was difficult to wean off.  He had multiple hospitalizations in the past with similar presentations including DT.   Currently he is on Olanzapine  10 mg BID (increased today), Seroquel , hydroxyzine . He was briefly trialed on Depakote .  His labs today WNL; except for HBG.  CT done 1/29 NAICP.  Vitals trending towards normal; PR 66 -95; BP: 121/70 - 138/93.  Patient seen at bed side.  Engaged with varying levels of concentration throughout the interview. Tells me that today's date is the 34th of second or third. Year is 2025. President is my man, Obama.  I don't want to be too political.  Did not want to tells the days of the week backwards I don't believe in backward. I also don't believe in forward.   I asked about hallucinations; he tells me that he sees white lions. Magestic white lions  His current presentation is most consistent with delirium. Given that his last drink is more than a week, this being DT is extremely low.  Per chart and current examination, his delirium is likely arising from delirium as opposed to a primary thought disorder. Given his chronic alcohol  history, he would benefit from thiamine  prescription upon discharge as he is at risk for Wernicke's. Please see plan below for detailed recommendations.     Diagnoses:  Active Hospital problems: Principal Problem:   Alcohol  withdrawal (HCC) Active Problems:   DTs (delirium tremens) (HCC)   Acute hypoxic respiratory failure (HCC)     Plan   ## Psychiatric Medication Recommendations:  Alcohol  withdrawal Hyperactive delirium - Delirium precautions - Continue Precedex ; continue to wean as able - Continue Olanzapine  10 mg BID (first full dose today) - Consider restarting Depakote  500 mg BID versus Carbamazepine  200 mg BID tomorrow if patient doesn't do better on Olanzapine  10 mg BID today - Continue Seroquel  for now, with plans to taper off.  - Continue Thiamine   -- Start Melatonin nightly -- Once his delirium improves, goal is to taper off antipsychotics before discharge   ## Medical Decision Making Capacity:  - None  ## Further Work-up:  -- Monitor labs regularly    ## Disposition:  -- Deferred   ## Behavioral / Environmental:  --  Delirium precautions    ## Safety and Observation Level:  - Based on my clinical evaluation, I estimate the patient to be at low risk of self harm in the current setting - At this time, we recommend a intermediate level of observation. This decision is based on my review of the chart including patient's history and current presentation, interview of the patient, mental status examination, and consideration of suicide risk including evaluating suicidal ideation, plan, intent, suicidal or self-harm behaviors, risk factors, and protective factors. This judgment is based on our ability to directly address suicide risk, implement suicide prevention strategies and develop a safety  plan while the patient is in the clinical setting. Please contact our team if there is a concern that risk level has changed.  Suicide risk assessment  Patient has following modifiable risk factors for suicide: delirium which we are addressing by psychopharm and non-psychopharm approaches  Patient has following  non-modifiable or demographic risk factors for suicide: male gender and psychiatric hospitalization  Patient has the following protective factors against suicide: no history of suicide attempts and no history of NSSIB   Thank you for this consult request. Recommendations have been communicated to the primary team.  We will continue to follow up at this time.   Jermaine Kea, MD  Psychiatric and Social History   Relevant Aspects of Hospital Course:    Psych ROS:  not completed due to patient's current mental status    Exam Findings   Psychiatric Specialty Exam:  Presentation  General Appearance: Disheveled  Eye Contact:Fleeting  Speech:Garbled  Speech Volume:Decreased  Handedness:Ambidextrous   Mood and Affect  Mood:Labile  Affect:Non-Congruent   Thought Process  Thought Processes:Disorganized  Descriptions of Associations:Loose  Orientation:Partial  Thought Content:Delusions; Illogical  Hallucinations:Hallucinations: Visual  Ideas of Reference:No data recorded Suicidal Thoughts:Suicidal Thoughts: No  Homicidal Thoughts:Homicidal Thoughts: No   Sensorium  Memory:Other (comment)  Judgment:Poor  Insight:Poor   Executive Functions  Concentration:Poor  Attention Span:Poor  Recall:Poor  Fund of Knowledge:Poor  Language:Poor   Psychomotor Activity  Psychomotor Activity:Psychomotor Activity: Decreased   Assets  Assets:No data recorded  Sleep  Sleep:Sleep: Fair    Physical Exam: Vital signs:  Temp:  [97.7 F (36.5 C)-98.5 F (36.9 C)] 97.7 F (36.5 C) (02/06 0800) Pulse Rate:  [58-95] 95 (02/06 1105) Resp:  [14-25] 21 (02/06 1105) BP: (92-146)/(57-93) 138/93 (02/06 1105) SpO2:  [88 %-100 %] 95 % (02/06 1105) Weight:  [86 kg] 86 kg (02/06 0500) Physical Exam  Blood pressure (!) 138/93, pulse 95, temperature 97.7 F (36.5 C), temperature source Oral, resp. rate (!) 21, height 5' 10 (1.778 m), weight 86 kg, SpO2 95%. Body mass  index is 27.2 kg/m.   Other History   These have been pulled in through the EMR, reviewed, and updated if appropriate.   Family History:   The patient's family history is not on file. He was adopted.  Medical History: Past Medical History:  Diagnosis Date  . Alcohol  abuse   . Anxiety   . Class 1 obesity 09/14/2021  . Depression     Surgical History: Past Surgical History:  Procedure Laterality Date  . HERNIA REPAIR      Medications:   Current Facility-Administered Medications:  .  acetaminophen  (TYLENOL ) tablet 650 mg, 650 mg, Per Tube, Q6H PRN **OR** acetaminophen  (TYLENOL ) suppository 650 mg, 650 mg, Rectal, Q6H PRN, Desai, Nikita S, MD .  bisacodyl  (DULCOLAX) suppository 10 mg, 10 mg, Rectal, Daily PRN, Antonetta Moccasin B, NP .  Chlorhexidine  Gluconate Cloth 2 % PADS 6 each, 6 each, Topical, Daily, Opyd, Evalene RAMAN, MD, 6 each at 07/03/23 1153 .  dexmedetomidine  (PRECEDEX ) 400 MCG/100ML (4 mcg/mL) infusion, 0-1.5 mcg/kg/hr, Intravenous, Continuous, Jenna Maude BRAVO, NP, Last Rate: 34 mL/hr at 07/04/23 0911, 1.5 mcg/kg/hr at 07/04/23 0911 .  sennosides (SENOKOT) 8.8 MG/5ML syrup 5 mL, 5 mL, Per Tube, QHS PRN **AND** docusate (COLACE) 50 MG/5ML liquid 50 mg, 50 mg, Per Tube, QHS PRN, Desai, Nikita S, MD, 50 mg at 07/01/23 2247 .  feeding supplement (OSMOLITE 1.5 CAL) liquid 1,000 mL, 1,000 mL, Per Tube, Continuous, Meade Verdon RAMAN, MD, Last Rate:  60 mL/hr at 07/04/23 0817, Infusion Verify at 07/04/23 0817 .  feeding supplement (PROSource TF20) liquid 60 mL, 60 mL, Per Tube, Daily, Jenna Coy E, NP, 60 mL at 07/04/23 1000 .  folic acid  (FOLVITE ) tablet 1 mg, 1 mg, Per Tube, Daily, Desai, Nikita S, MD, 1 mg at 07/04/23 1000 .  hydrALAZINE  (APRESOLINE ) injection 10 mg, 10 mg, Intravenous, Q6H PRN, Paliwal, Aditya, MD, 10 mg at 06/30/23 2218 .  hydrOXYzine  (ATARAX ) tablet 25 mg, 25 mg, Per Tube, Q6H PRN, Desai, Nikita S, MD, 25 mg at 07/04/23 0730 .  lactulose  (CHRONULAC ) 10  GM/15ML solution 10 g, 10 g, Per Tube, BID, Antonetta Moccasin B, NP, 10 g at 07/04/23 1000 .  loperamide  HCl (IMODIUM ) 1 MG/7.5ML suspension 2-4 mg, 2-4 mg, Per Tube, PRN, Desai, Nikita S, MD .  LORazepam  (ATIVAN ) tablet 2 mg, 2 mg, Per Tube, TID, Antonetta Moccasin B, NP, 2 mg at 07/04/23 1000 .  multivitamin with minerals tablet 1 tablet, 1 tablet, Per Tube, Daily, Desai, Nikita S, MD, 1 tablet at 07/04/23 1000 .  OLANZapine  zydis (ZYPREXA ) disintegrating tablet 10 mg, 10 mg, Per Tube, BID, Antonetta Moccasin B, NP, 10 mg at 07/04/23 1000 .  Oral care mouth rinse, 15 mL, Mouth Rinse, 4 times per day, Meade Verdon RAMAN, MD, 15 mL at 07/04/23 0733 .  Oral care mouth rinse, 15 mL, Mouth Rinse, PRN, Desai, Nikita S, MD .  LORINA PHENObarbital  (LUMINAL) injection 97.5 mg, 97.5 mg, Intravenous, Q8H, 97.5 mg at 06/30/23 1141 **FOLLOWED BY** [COMPLETED] PHENObarbital  (LUMINAL) injection 65 mg, 65 mg, Intravenous, Q8H, 65 mg at 07/02/23 1249 **FOLLOWED BY** PHENObarbital  (LUMINAL) injection 32.5 mg, 32.5 mg, Intravenous, Q8H, Simpson, Paula B, NP, 32.5 mg at 07/04/23 0352 .  polyethylene glycol (MIRALAX  / GLYCOLAX ) packet 17 g, 17 g, Per Tube, Daily, Jenna Coy BRAVO, NP, 17 g at 07/03/23 9056 .  QUEtiapine  (SEROQUEL ) tablet 200 mg, 200 mg, Per Tube, BID, Babcock, Peter E, NP, 200 mg at 07/04/23 1000 .  sodium chloride  flush (NS) 0.9 % injection 10 mL, 10 mL, Intravenous, PRN, Geronimo, Murali, MD .  sodium chloride  flush (NS) 0.9 % injection 3 mL, 3 mL, Intravenous, Q12H, Opyd, Evalene RAMAN, MD, 3 mL at 07/04/23 1005 .  thiamine  (VITAMIN B1) tablet 100 mg, 100 mg, Per Tube, Daily, 100 mg at 07/04/23 1000 **OR** thiamine  (VITAMIN B1) injection 100 mg, 100 mg, Intravenous, Daily, Desai, Nikita S, MD, 100 mg at 07/02/23 1022  Allergies: Allergies  Allergen Reactions  . Ketamine      Hx of causing fever in 2024   . Codeine Itching

## 2023-07-04 NOTE — Progress Notes (Signed)
 Patient continues to display paranoid behavior towards staff, refusing lab draws. PRN medication given to patient per CIWA. Phlebotomist told RN she would come back later to try again.   Patient also told RN he was seeing a bear in his room and said, We can take him, you get the top and I'll get the bottom. RN assured patient there wasn't a bear in his room and patient started to relax a little with reassurance. RN will continue to implement safety precautions as ordered.

## 2023-07-04 NOTE — Plan of Care (Signed)
  Problem: Coping: Goal: Level of anxiety will decrease Outcome: Progressing   Problem: Safety: Goal: Ability to remain free from injury will improve Outcome: Progressing   Problem: Skin Integrity: Goal: Risk for impaired skin integrity will decrease Outcome: Progressing   Problem: Safety: Goal: Non-violent Restraint(s) Outcome: Not Progressing

## 2023-07-04 NOTE — Progress Notes (Signed)
 Nutrition Follow-up  DOCUMENTATION CODES:   Not applicable  INTERVENTION:  - Continue goal TF regimen via NGT: Osmolite 1.5 at 60 ml/h (1440 ml per day) Prosource TF20 60 ml daily Provides 2240 kcal, 110 gm protein, 1097 ml free water  daily  - Continue to monitor magnesium , potassium, and phosphorus, MD to replete as needed. -Continue Thiamine  100 mg daily    -Given alcohol  abuse, checking vitamin labs: - Thiamine : pending - Vitamin B6: pending - Vitamin B12: WNL   -Continue vitamins via CIWA  - Will monitor for diet advancement.   NUTRITION DIAGNOSIS:   Increased nutrient needs related to chronic illness as evidenced by estimated needs. *ongoing  GOAL:   Patient will meet greater than or equal to 90% of their needs *met with TF  MONITOR:   TF tolerance  REASON FOR ASSESSMENT:   Consult Enteral/tube feeding initiation and management  ASSESSMENT:   58 y.o. male who has a PMH as below including but not limited to anxiety, depression, EtOH dependence. He presented to urgent care 1/28 with confusion, tremors, anxiety, presyncopal symptoms.  1/28 - Admit 2/3 - NGT placed, Osmolite 1.5 started @ 25mL/hr  Patient continues to be encephalopathic. Noted to be having severe hallucinations and be aggressive towards staff. At time of visit, patient talking to himself. TF infusing at goal of 69mL/hr with no noted intolerances.  Plan to continue TF until mental status more stable and patient able to take PO consistently.    Admit weight: 200# Current weight: 189# I&O's: +3.8L Possible weight loss since admit, will monitor.  Medications reviewed and include: 1mg  folic acid , MVI, 100mg  thiamine , Miralax   Labs reviewed:  - Vitamin B1 - pending Vitamin B6 - pending  Latest Reference Range & Units 07/02/23 06:25  Vitamin B12 180 - 914 pg/mL 816    Diet Order:   Diet Order             Diet NPO time specified  Diet effective now                    EDUCATION NEEDS:  Not appropriate for education at this time  Skin:  Skin Assessment: Reviewed RN Assessment  Last BM:  2/6 - type 6  Height:  Ht Readings from Last 1 Encounters:  06/25/23 5' 10 (1.778 m)   Weight:  Wt Readings from Last 1 Encounters:  07/04/23 86 kg    BMI:  Body mass index is 27.2 kg/m.  Estimated Nutritional Needs:  Kcal:  2200-2400 Protein:  100-115g Fluid:  2.2L/day    Trude Ned RD, LDN Contact via Secure Chat.

## 2023-07-04 NOTE — Progress Notes (Signed)
 NAME:  Jermaine [name], MRN:  [id number], DOB:  May 01, 1966, LOS: 9 ADMISSION DATE:  06/25/2023, CONSULTATION DATE:  06/26/23 REFERRING MD:  Bryn CHIEF COMPLAINT:  EtOH Withdrawal   BRIEF  Jermaine [name] is a 58 y.o. male who has a PMH as below including but not limited to anxiety, depression, EtOH dependence. He presented to urgent care 1/28 with confusion, tremors, anxiety, presyncopal symptoms. He was found to have tachycardia, tachypnea, hypoxia and was subsequently told to come to the ED for further evaluaiton.  In ED, he had persistent symptoms. He informed staff that his last drink was earlier that day. He normally drinks 1 - 2 pints Titos every day or every other day along with 2 - 4 White Claws. On day of presentation, he believes he only drank [employer] and no liquor.  He was found to have ethanol of 283 and despite Ativan  and Versed , continued to have symptoms. He was subsequently admitted by [facility] for EtOH withdrawal and was started on Precedex  and Ativan  per CIWA. Head CT and UDS negative. He denies any recreational drug use. He has had withdrawal in the past requiring hospitalization. Appears just admitted to this facility 05/20/23 through 05/24/23 for the same as well as suicidal/homicidal ideation. He was IVC'd at the time and was seen by psych who cleared him for discharge. Per chart review, he has had multiple admissions for the same with 5 admissions in the last 6 months.  On PM 1/29, he remained on 0.4 Precedex ; therefore, PCCM asked to assist with his care. He is A&O x 3, no deficits. He had some hallucinations this AM but currently is lucid and appropriately interactive in conversation. He tells me that he has a job interview tomorrow (Thursday 1/30) which he would really like to not miss. I informed him that he would not be discharged by then and recommended he call the employer and request a delay in his interview.  Pertinent  Medical History:  has Alcohol  abuse with intoxication  (HCC); Alcohol  abuse; Hepatic steatosis; Anxiety and depression; Sinus tachycardia; Class 1 obesity; Secondary polycythemia; DTs (delirium tremens) (HCC); Alcohol  withdrawal (HCC); Facial fracture due to fall [facility]); Orbital floor (blow-out) closed fracture (HCC); Alcohol -induced mood disorder (HCC); C. difficile colitis; and Acute hypoxic respiratory failure (HCC) on their problem list.  Significant Hospital Events: Including procedures, antibiotic start and stop dates in addition to other pertinent events   1/28 admit 1/29 PCCM consult 1/31 - Unable to wean precedex  thus far, up to 1.8 dex this am.  Either sedated or agitated, swinging at staff this am 2/1 - 06/29/2023: Required Haldol  and even additional phenobarb.  This is despite being on Precedex  infusion.  Currently afebrile with a normal white count.  Intermittent agitation overnight.  Continued on Precedex  getting scheduled Haldol  2/3: nasogastric tube placed. Focus changed to treating NARD over DT. Added scheduled seroquel , clonidine  taper and librium  taper. Allowing pheno to taper off on current schedule initially was weaned off from the Precedex , but then got agitated had once again was rapidly titrated back up to ceiling dose 2/4 back on dex. Still agitated at times, kicked nursing staff during the evening hours of 2/3 Seroquel  increased  Interim History / Subjective:  Severe hallucinations, seeing lions but apparently slept better overnight Unable to wean precedex , 1.5  Objective:  Blood pressure 128/80, pulse 71, temperature 97.7 F (36.5 C), temperature source Oral, resp. rate 19, height 5' 10 (1.778 m), weight 86 kg, SpO2 96%.  Intake/Output Summary (Last 24 hours) at 07/04/2023 0830 Last data filed at 07/04/2023 9182 Gross per 24 hour  Intake 2274.09 ml  Output 1600 ml  Net 674.09 ml   Filed Weights   07/02/23 0402 07/03/23 0500 07/04/23 0500  Weight: 90.8 kg 85.2 kg 86 kg    Examination: Dex 1.4 General:   chronically ill appearing adult male in bed HEENT: MM pink/moist, pupils 3/r, cortrak Neuro: Awake, oriented to name, follows commands, MAE, laughing then easily agitated, impulsive, remains in wrist restraints with mittens CV: rr, SR, no murmur PULM:  non labored, clear, diminished in bases, few R basilar rales with deep breathing, 98% RA GI: soft, bs+, NT, purwick Extremities: warm/dry, no LE edema  Skin: no rashes   BMET pending UOP 1.6L, net +4L, wts inconsistent  Qtc 431  Labs/imaging personally reviewed:  CT head 1/29 > neg.  Assessment & Plan:   Acute alcohol  withdrawal with DTs and now ongoing Acute metabolic encephalopathy due to nonalcohol related delirium (NARD)  - appears withdrawal started around the 28th, ETOH 283 on admit. Plan - VPA did not seem to add benefit.  Changed to zyprexa .  Qtc ok, will increase to 10mg  BID - cont to wean precedex  as able.  Clonidine  does not seem to have added any benefit, d/c - cont seroquel  200mg  BID - monitor Qtc, optimize lytes - phenobarb and librium  taper completes today 2/6 - cont enteral ativan  2mg  TID - prn hydroxyzine , consider making scheduled - will consult psych to assist with further rec/ med management.  He is better oriented at times, but having severe hallucinations, easily agitated/ escalated and remains impulsive.   - empiric thiamine / folic/ MVI - delirium precautions - minimize restraints, safety sitter if available - cont NPO for now till mental status more stable, TF per cortrak for nutrition/ enteral meds - monitor for urinary retention  Acute hypoxic respiratory failure, resolved - likely having component of atelectasis.  Currently on RA.  PRN O2 - pulm hygiene, IS, mobilize when safe/ PT  Query undiagnosed OSA? Plan Consider outpt f/u  Fluid and electrolyte imbalance: Hypokalemia, resolved Plan - trend BMET  At risk for malnutrition - cont TF per ED till mental status able to take POs consistently  -  cont lactulose  BID for BM, last BM on 1/3, dulcolax prn  Best practice (evaluated daily):  Diet/type: NPO DVT prophylaxis: LMWH Pressure ulcer(s): pressure ulcer assessment deferred > n/a GI prophylaxis: N/A Lines: N/A Foley:  N/A Code Status:  full code Last date of multidisciplinary goals of care discussion: pending 1/31  Pending, no family at bedside.   My time 66 min   Lyle Pesa, MSN, AG-ACNP-BC [facility] 07/04/2023, 8:30 AM  See Amion for pager If no response to pager , please call [phone] until 7pm After 7:00 pm call Elink  [phone]

## 2023-07-04 NOTE — Progress Notes (Addendum)
 Reports of CIWA 21.  Not due for meds till 8pm.  Psych consult appreciated.  Will start Depakote  500mg  BID per recommendations.  Not much improvement with prn hydroxyzine . Ativan  2mg  IV x 1 now.      Lyle Pesa, MSN, AG-ACNP-BC [facility] 07/04/2023, 6:21 PM  See Amion for pager If no response to pager , please call [phone] until 7pm After 7:00 pm call Elink  [phone]

## 2023-07-04 NOTE — Significant Event (Signed)
 Responded to nurse distress alarm this morning at approximately 0615. Per bedside RN, he has been okay all night, alert and oriented, but this morning he started hallucinating again saying today her sister is getting married to a princess and that we are keeping him from attending the wedding. Patient continued to be physically and verbally aggressive towards staff. Precedex  titration as prescribed, restraints in place, and safety maintained for patient and staff.

## 2023-07-05 DIAGNOSIS — J9601 Acute respiratory failure with hypoxia: Secondary | ICD-10-CM | POA: Diagnosis not present

## 2023-07-05 DIAGNOSIS — R509 Fever, unspecified: Secondary | ICD-10-CM | POA: Diagnosis not present

## 2023-07-05 DIAGNOSIS — G9341 Metabolic encephalopathy: Secondary | ICD-10-CM | POA: Diagnosis not present

## 2023-07-05 DIAGNOSIS — F05 Delirium due to known physiological condition: Secondary | ICD-10-CM | POA: Diagnosis not present

## 2023-07-05 LAB — GLUCOSE, CAPILLARY
Glucose-Capillary: 142 mg/dL — ABNORMAL HIGH (ref 70–99)
Glucose-Capillary: 137 mg/dL — ABNORMAL HIGH (ref 70–99)
Glucose-Capillary: 75 mg/dL (ref 70–99)
Glucose-Capillary: 97 mg/dL (ref 70–99)
Glucose-Capillary: 143 mg/dL — ABNORMAL HIGH (ref 70–99)
Glucose-Capillary: 102 mg/dL — ABNORMAL HIGH (ref 70–99)

## 2023-07-05 LAB — CBC WITH DIFFERENTIAL/PLATELET
Abs Immature Granulocytes: 0.05 10*3/uL (ref 0.00–0.07)
Basophils Absolute: 0 10*3/uL (ref 0.0–0.1)
Basophils Relative: 0 %
Eosinophils Absolute: 0.1 10*3/uL (ref 0.0–0.5)
Eosinophils Relative: 1 %
HCT: 49.8 % (ref 39.0–52.0)
Hemoglobin: 15.9 g/dL (ref 13.0–17.0)
Immature Granulocytes: 0 %
Lymphocytes Relative: 14 %
Lymphs Abs: 1.6 10*3/uL (ref 0.7–4.0)
MCH: 31.7 pg (ref 26.0–34.0)
MCHC: 31.9 g/dL (ref 30.0–36.0)
MCV: 99.2 fL (ref 80.0–100.0)
Monocytes Absolute: 1.1 10*3/uL — ABNORMAL HIGH (ref 0.1–1.0)
Monocytes Relative: 10 %
Neutro Abs: 8.6 10*3/uL — ABNORMAL HIGH (ref 1.7–7.7)
Neutrophils Relative %: 75 %
Platelets: 174 10*3/uL (ref 150–400)
RBC: 5.02 MIL/uL (ref 4.22–5.81)
RDW: 12.6 % (ref 11.5–15.5)
WBC: 11.5 10*3/uL — ABNORMAL HIGH (ref 4.0–10.5)
nRBC: 0 % (ref 0.0–0.2)

## 2023-07-05 MED ORDER — ORAL CARE MOUTH RINSE: 15.0000 mL | OROMUCOSAL | Status: DC | PRN

## 2023-07-05 MED ORDER — OLANZAPINE 5 MG PO TBDP
15.0000 mg | ORAL_TABLET | Freq: Two times a day (BID) | ORAL | Status: DC
Start: 2023-07-05 — End: 2023-07-09
  Administered 2023-07-05 – 2023-07-08 (×7): 15 mg
  Filled 2023-07-05 (×7): qty 3

## 2023-07-05 MED ORDER — PHENOBARBITAL SODIUM 65 MG/ML IJ SOLN
65.0000 mg | Freq: Once | INTRAMUSCULAR | Status: AC
  Administered 2023-07-05: 65 mg via INTRAVENOUS
  Filled 2023-07-05: qty 1

## 2023-07-05 MED ORDER — PHENOBARBITAL SODIUM 130 MG/ML IJ SOLN: 97.5000 mg | Freq: Three times a day (TID) | INTRAMUSCULAR | Status: DC | PRN

## 2023-07-05 MED ORDER — PHENOBARBITAL SODIUM 130 MG/ML IJ SOLN
130.0000 mg | Freq: Three times a day (TID) | INTRAMUSCULAR | Status: AC
  Administered 2023-07-05 – 2023-07-07 (×6): 130 mg via INTRAVENOUS
  Filled 2023-07-05 (×6): qty 1

## 2023-07-05 NOTE — Progress Notes (Signed)
 NAME:  Jermaine [name], MRN:  [id number], DOB:  03-02-1966, LOS: 10 ADMISSION DATE:  06/25/2023, CONSULTATION DATE:  06/26/23 REFERRING MD:  Bryn CHIEF COMPLAINT:  EtOH Withdrawal   BRIEF  Jermaine [name] is a 58 y.o. male who has a PMH as below including but not limited to anxiety, depression, EtOH dependence. He presented to urgent care 1/28 with confusion, tremors, anxiety, presyncopal symptoms. He was found to have tachycardia, tachypnea, hypoxia and was subsequently told to come to the ED for further evaluaiton.  In ED, he had persistent symptoms. He informed staff that his last drink was earlier that day. He normally drinks 1 - 2 pints Titos every day or every other day along with 2 - 4 White Claws. On day of presentation, he believes he only drank [employer] and no liquor.  He was found to have ethanol of 283 and despite Ativan  and Versed , continued to have symptoms. He was subsequently admitted by [facility] for EtOH withdrawal and was started on Precedex  and Ativan  per CIWA. Head CT and UDS negative. He denies any recreational drug use. He has had withdrawal in the past requiring hospitalization. Appears just admitted to this facility 05/20/23 through 05/24/23 for the same as well as suicidal/homicidal ideation. He was IVC'd at the time and was seen by psych who cleared him for discharge. Per chart review, he has had multiple admissions for the same with 5 admissions in the last 6 months.  On PM 1/29, he remained on 0.4 Precedex ; therefore, PCCM asked to assist with his care. He is A&O x 3, no deficits. He had some hallucinations this AM but currently is lucid and appropriately interactive in conversation. He tells me that he has a job interview tomorrow (Thursday 1/30) which he would really like to not miss. I informed him that he would not be discharged by then and recommended he call the employer and request a delay in his interview.  Pertinent  Medical History:  has Alcohol  abuse with intoxication  (HCC); Alcohol  abuse; Hepatic steatosis; Anxiety and depression; Sinus tachycardia; Class 1 obesity; Secondary polycythemia; DTs (delirium tremens) (HCC); Alcohol  withdrawal (HCC); Facial fracture due to fall [facility]); Orbital floor (blow-out) closed fracture (HCC); Alcohol -induced mood disorder (HCC); C. difficile colitis; and Acute hypoxic respiratory failure (HCC) on their problem list.  Significant Hospital Events: Including procedures, antibiotic start and stop dates in addition to other pertinent events   1/28 admit 1/29 PCCM consult 1/31 - Unable to wean precedex  thus far, up to 1.8 dex this am.  Either sedated or agitated, swinging at staff this am 2/1 - 06/29/2023: Required Haldol  and even additional phenobarb.  This is despite being on Precedex  infusion.  Currently afebrile with a normal white count.  Intermittent agitation overnight.  Continued on Precedex  getting scheduled Haldol  2/3: nasogastric tube placed. Focus changed to treating NARD over DT. Added scheduled seroquel , clonidine  taper and librium  taper. Allowing pheno to taper off on current schedule initially was weaned off from the Precedex , but then got agitated had once again was rapidly titrated back up to ceiling dose 2/4 back on dex. Still agitated at times, kicked nursing staff during the evening hours of 2/3 Seroquel  increased  Interim History / Subjective:  Severe hallucinations, seeing lions but apparently slept better overnight Unable to wean precedex , 1.5  Objective:  Blood pressure 128/66, pulse 90, temperature 97.7 F (36.5 C), temperature source Axillary, resp. rate (!) 23, height 5' 10 (1.778 m), weight 89 kg, SpO2 96%.  Intake/Output Summary (Last 24 hours) at 07/05/2023 1010 Last data filed at 07/05/2023 1005 Gross per 24 hour  Intake 2316.38 ml  Output 2150 ml  Net 166.38 ml   Filed Weights   07/04/23 0500 07/05/23 0500 07/05/23 0800  Weight: 86 kg 86 kg 89 kg   Examination: Dex 1.5, had been  paused> more agitated General:  chronically ill appearing older male sitting upright in bed, mildly agitated, still having visual hallucinations but seems better than yesterday, currently in 4pt restraints, slight tremor HEENT: MM pink/moist, face flushed, mild forehead diaphoresis Neuro: Oriented to person and place, otherwise confused, MAE CV: rr, no murmur PULM:  non labored, clear, RA GI: soft, bs+, NT, purwick Extremities: warm/dry, no LE edema  Skin: no rashes   Tmax 100.7 UOP 2.7L/ 24hrs Qtc 432  Labs/imaging personally reviewed:  CT head 1/29 > neg.  Assessment & Plan:   Acute alcohol  withdrawal with DTs and now ongoing Acute metabolic encephalopathy due to nonalcohol related delirium (NARD)  - appears withdrawal started around the 28th, ETOH 283 on admit. - phenobarb and librium  taper completed 2/6 Plan - appreciate psych input 2/7 -  Did not need any PRNs overnight but still unable to wean precedex , cont to minimize - cont olanzapine  10mg  BID, seroquel  200mg  BID (plan to taper prn), and depakote  500mg  BID and melatonin started 2/6 PM for worsening agitation/ CIWA 21 - prn ativan  for severe agitation, if not helping, consider prn phenobarb - Otc remains wnl, monitor - cont enteral ativan  2mg  TID - prn hydroxyzine  - cont wrist restraints, d/c legs for now if not trying to kick staff, given ongoing impulsiveness/ easily escalated when agitated,  - empiric thiamine / folic/ MVI - delirium precautions - monitor for urinary retention  Acute hypoxic respiratory failure, resolved - likely having component of atelectasis.  Currently on RA.  PRN O2 qHS - pulm hygiene, IS, mobilize when safe/ PT  Query undiagnosed OSA? Plan - Consider outpt f/u  Fluid and electrolyte imbalance: Hypokalemia, resolved Plan - trend BMET  At risk for malnutrition - cont TF for now for consistency, once more oriented and not requiring restraints, can start orals - cont lactulose  BID for BM,  last BM on 1/6, dulcolax prn  Leukocytosis - tmax 100.3.  continue to monitor,   Best practice (evaluated daily):  Diet/type: NPO; TF per cortrak DVT prophylaxis: LMWH Pressure ulcer(s): pressure ulcer assessment deferred > n/a GI prophylaxis: N/A Lines: N/A Foley:  N/A Code Status:  full code Last date of multidisciplinary goals of care discussion: pending 1/31 No family at bedside.   My time 47 min   Jermaine Pesa, MSN, AG-ACNP-BC [facility] 07/05/2023, 10:10 AM  See Amion for pager If no response to pager , please call [phone] until 7pm After 7:00 pm call Elink  [phone]

## 2023-07-05 NOTE — Consult Note (Signed)
 Jermaine [name] Psychiatry Consult Evaluation  Service Date: July 05, 2023 LOS:  LOS: 10 days    Primary Psychiatric Diagnoses  Hyperactive delirium 2.  Alcohol  use disorder 3.  Depression  Assessment  Jermaine [name] is a 58 y.o. male admitted medically for 06/25/2023  5:56 PM for alcohol  withdrawal. He carries the psychiatric diagnoses of alcohol  use disorder, depression and anxiety. Psychiatry was consulted for management of alcohol  withdrawal and agitation.  Per chart, the patient was admitted on 1/28   His BAL was 283 on admission, and was given Ativan  and Versed  with minimal improvement in withdrawal.  He was admitted and was started on precedex , and was difficult to wean off.  He had multiple hospitalizations in the past with similar presentations including DT.   On assessment today the patient remains delusional and appears to be responding to internal stimuli. He made a strange comment about needing to change his uniform and he is unaware of where he is. Per nursing staff the patient has not made much progress with the psychiatric medications that he has been given and they are continuing to try and wean the patient off Precedex . When his dose is decreased the patient becomes more physically aggressive with staff.    Diagnoses:  Active Hospital problems: Principal Problem:   Alcohol  withdrawal (HCC) Active Problems:   DTs (delirium tremens) (HCC)   Acute hypoxic respiratory failure (HCC)     Plan   ## Psychiatric Medication Recommendations:  Alcohol  withdrawal Hyperactive delirium - Delirium precautions - Continue Precedex ; continue to wean as able - Increase Olanzapine  15 mg BID  - Discontinue Seroquel  200 mg PO BID - Continue Depakote  500 mg BID  - Continue Thiamine   -- Start Melatonin nightly -- Once his delirium improves, goal is to taper off antipsychotics before discharge   ## Medical Decision Making Capacity:  - None  ## Further Work-up:  -- Monitor labs  regularly    ## Disposition:  -- Deferred   ## Behavioral / Environmental:  --  Delirium precautions    ## Safety and Observation Level:  - Based on my clinical evaluation, I estimate the patient to be at low risk of self harm in the current setting - At this time, we recommend a intermediate level of observation. This decision is based on my review of the chart including patient's history and current presentation, interview of the patient, mental status examination, and consideration of suicide risk including evaluating suicidal ideation, plan, intent, suicidal or self-harm behaviors, risk factors, and protective factors. This judgment is based on our ability to directly address suicide risk, implement suicide prevention strategies and develop a safety plan while the patient is in the clinical setting. Please contact our team if there is a concern that risk level has changed.  Suicide risk assessment  Patient has following modifiable risk factors for suicide: delirium which we are addressing by psychopharm and non-psychopharm approaches  Patient has following non-modifiable or demographic risk factors for suicide: male gender and psychiatric hospitalization  Patient has the following protective factors against suicide: no history of suicide attempts and no history of NSSIB   Thank you for this consult request. Recommendations have been communicated to the primary team.  We will continue to follow up at this time.   Jermaine LITTIE Glatter, DO  Psychiatric and Social History   Relevant Aspects of Hospital Course:    Psych ROS:  not completed due to patient's current mental status    Exam Findings  Psychiatric Specialty Exam:  Presentation  General Appearance: Disheveled  Eye Contact:Fleeting  Speech:Garbled  Speech Volume:Decreased  Handedness:Ambidextrous   Mood and Affect  Mood:Labile  Affect:Non-Congruent   Thought Process  Thought  Processes:Disorganized  Descriptions of Associations:Loose  Orientation:Partial  Thought Content:Delusions; Illogical  Hallucinations:Hallucinations: Visual  Ideas of Reference:No data recorded Suicidal Thoughts:Suicidal Thoughts: No  Homicidal Thoughts:Homicidal Thoughts: No   Sensorium  Memory:Other (comment)  Judgment:Poor  Insight:Poor   Executive Functions  Concentration:Poor  Attention Span:Poor  Recall:Poor  Fund of Knowledge:Poor  Language:Poor   Psychomotor Activity  Psychomotor Activity:Psychomotor Activity: Decreased   Assets  Assets:No data recorded  Sleep  Sleep:Sleep: Good    Physical Exam: Vital signs:  Temp:  [97.7 F (36.5 C)-100.7 F (38.2 C)] 99.3 F (37.4 C) (02/07 1100) Pulse Rate:  [59-141] 128 (02/07 1039) Resp:  [5-27] 21 (02/07 1039) BP: (74-168)/(42-126) 95/66 (02/07 1000) SpO2:  [78 %-100 %] 96 % (02/07 1039) Weight:  [86 kg-89 kg] 89 kg (02/07 0800) Physical Exam  Blood pressure 95/66, pulse (!) 128, temperature 99.3 F (37.4 C), temperature source Oral, resp. rate (!) 21, height 5' 10 (1.778 m), weight 89 kg, SpO2 96%. Body mass index is 28.15 kg/m.   Other History   These have been pulled in through the EMR, reviewed, and updated if appropriate.   Family History:   The patient's family history is not on file. He was adopted.  Medical History: Past Medical History:  Diagnosis Date  . Alcohol  abuse   . Anxiety   . Class 1 obesity 09/14/2021  . Depression     Surgical History: Past Surgical History:  Procedure Laterality Date  . HERNIA REPAIR      Medications:   Current Facility-Administered Medications:  .  acetaminophen  (TYLENOL ) tablet 650 mg, 650 mg, Per Tube, Q6H PRN, 650 mg at 07/04/23 2125 **OR** acetaminophen  (TYLENOL ) suppository 650 mg, 650 mg, Rectal, Q6H PRN, Desai, Nikita S, MD .  bisacodyl  (DULCOLAX) suppository 10 mg, 10 mg, Rectal, Daily PRN, Antonetta Moccasin B, NP .  Chlorhexidine   Gluconate Cloth 2 % PADS 6 each, 6 each, Topical, Daily, Opyd, Evalene RAMAN, MD, 6 each at 07/05/23 0959 .  dexmedetomidine  (PRECEDEX ) 400 MCG/100ML (4 mcg/mL) infusion, 0-1.5 mcg/kg/hr, Intravenous, Continuous, Jenna Maude BRAVO, NP, Last Rate: 34 mL/hr at 07/05/23 1117, 1.5 mcg/kg/hr at 07/05/23 1117 .  sennosides (SENOKOT) 8.8 MG/5ML syrup 5 mL, 5 mL, Per Tube, QHS PRN **AND** docusate (COLACE) 50 MG/5ML liquid 50 mg, 50 mg, Per Tube, QHS PRN, Desai, Nikita S, MD, 50 mg at 07/04/23 2125 .  feeding supplement (OSMOLITE 1.5 CAL) liquid 1,000 mL, 1,000 mL, Per Tube, Continuous, Meade Verdon RAMAN, MD, Last Rate: 60 mL/hr at 07/05/23 1117, Infusion Verify at 07/05/23 1117 .  feeding supplement (PROSource TF20) liquid 60 mL, 60 mL, Per Tube, Daily, Jenna Maude E, NP, 60 mL at 07/05/23 0959 .  folic acid  (FOLVITE ) tablet 1 mg, 1 mg, Per Tube, Daily, Desai, Nikita S, MD, 1 mg at 07/05/23 1000 .  hydrALAZINE  (APRESOLINE ) injection 10 mg, 10 mg, Intravenous, Q6H PRN, Paliwal, Aditya, MD, 10 mg at 06/30/23 2218 .  hydrOXYzine  (ATARAX ) tablet 25 mg, 25 mg, Per Tube, Q6H PRN, Antonetta Moccasin B, NP, 25 mg at 07/04/23 1346 .  lactulose  (CHRONULAC ) 10 GM/15ML solution 10 g, 10 g, Per Tube, BID, Antonetta Moccasin B, NP, 10 g at 07/05/23 1000 .  loperamide  HCl (IMODIUM ) 1 MG/7.5ML suspension 2-4 mg, 2-4 mg, Per Tube, PRN, Desai, Nikita S, MD .  LORazepam  (ATIVAN ) tablet 2 mg, 2 mg, Per Tube, TID, Antonetta Moccasin B, NP, 2 mg at 07/05/23 1000 .  melatonin tablet 3 mg, 3 mg, Per Tube, QHS, Erskine Girt, MD, 3 mg at 07/04/23 2125 .  multivitamin with minerals tablet 1 tablet, 1 tablet, Per Tube, Daily, Desai, Nikita S, MD, 1 tablet at 07/05/23 1000 .  OLANZapine  zydis (ZYPREXA ) disintegrating tablet 10 mg, 10 mg, Per Tube, BID, Antonetta Moccasin B, NP, 10 mg at 07/05/23 1000 .  Oral care mouth rinse, 15 mL, Mouth Rinse, 4 times per day, Meade Verdon RAMAN, MD, 15 mL at 07/05/23 0808 .  Oral care mouth rinse, 15 mL, Mouth Rinse,  PRN, Kassie Acquanetta Bradley, MD .  polyethylene glycol (MIRALAX  / GLYCOLAX ) packet 17 g, 17 g, Per Tube, Daily, Jenna Maude BRAVO, NP, 17 g at 07/03/23 9056 .  QUEtiapine  (SEROQUEL ) tablet 200 mg, 200 mg, Per Tube, BID, Jenna Maude BRAVO, NP, 200 mg at 07/05/23 1001 .  sodium chloride  flush (NS) 0.9 % injection 10 mL, 10 mL, Intravenous, PRN, Geronimo, Murali, MD .  sodium chloride  flush (NS) 0.9 % injection 3 mL, 3 mL, Intravenous, Q12H, Opyd, Evalene RAMAN, MD, 3 mL at 07/05/23 1004 .  thiamine  (VITAMIN B1) tablet 100 mg, 100 mg, Per Tube, Daily, 100 mg at 07/04/23 1000 **OR** thiamine  (VITAMIN B1) injection 100 mg, 100 mg, Intravenous, Daily, Desai, Nikita S, MD, 100 mg at 07/05/23 1005 .  valproic  acid (DEPAKENE ) 250 MG/5ML solution 500 mg, 500 mg, Per Tube, BID, Antonetta Moccasin B, NP, 500 mg at 07/05/23 1000  Allergies: Allergies  Allergen Reactions  . Ketamine      Hx of causing fever in 2024   . Codeine Itching

## 2023-07-05 NOTE — Plan of Care (Signed)
  Problem: Education: Goal: Knowledge of General Education information will improve Description: Including pain rating scale, medication(s)/side effects and non-pharmacologic comfort measures Outcome: Progressing   Problem: Health Behavior/Discharge Planning: Goal: Ability to manage health-related needs will improve Outcome: Progressing   Problem: Coping: Goal: Level of anxiety will decrease Outcome: Progressing   Problem: Nutrition: Goal: Adequate nutrition will be maintained Outcome: Progressing   Problem: Elimination: Goal: Will not experience complications related to bowel motility Outcome: Progressing Goal: Will not experience complications related to urinary retention Outcome: Progressing

## 2023-07-06 DIAGNOSIS — F10932 Alcohol use, unspecified with withdrawal with perceptual disturbance: Secondary | ICD-10-CM | POA: Diagnosis not present

## 2023-07-06 DIAGNOSIS — G9341 Metabolic encephalopathy: Secondary | ICD-10-CM

## 2023-07-06 DIAGNOSIS — R509 Fever, unspecified: Secondary | ICD-10-CM

## 2023-07-06 LAB — BASIC METABOLIC PANEL
Anion gap: 8 (ref 5–15)
BUN: 19 mg/dL (ref 6–20)
CO2: 30 mmol/L (ref 22–32)
Calcium: 8.9 mg/dL (ref 8.9–10.3)
Chloride: 98 mmol/L (ref 98–111)
Creatinine, Ser: 0.88 mg/dL (ref 0.61–1.24)
GFR, Estimated: >60 mL/min (ref >60)
Glucose, Bld: 113 mg/dL — ABNORMAL HIGH (ref 70–99)
Potassium: 4.8 mmol/L (ref 3.5–5.1)
Sodium: 136 mmol/L (ref 135–145)

## 2023-07-06 LAB — GLUCOSE, CAPILLARY
Glucose-Capillary: 114 mg/dL — ABNORMAL HIGH (ref 70–99)
Glucose-Capillary: 114 mg/dL — ABNORMAL HIGH (ref 70–99)
Glucose-Capillary: 124 mg/dL — ABNORMAL HIGH (ref 70–99)
Glucose-Capillary: 200 mg/dL — ABNORMAL HIGH (ref 70–99)
Glucose-Capillary: 85 mg/dL (ref 70–99)
Glucose-Capillary: 99 mg/dL (ref 70–99)

## 2023-07-06 LAB — CBC
HCT: 43.4 % (ref 39.0–52.0)
Hemoglobin: 14.4 g/dL (ref 13.0–17.0)
MCH: 32.4 pg (ref 26.0–34.0)
MCHC: 33.2 g/dL (ref 30.0–36.0)
MCV: 97.5 fL (ref 80.0–100.0)
Platelets: 201 10*3/uL (ref 150–400)
RBC: 4.45 MIL/uL (ref 4.22–5.81)
RDW: 12.6 % (ref 11.5–15.5)
WBC: 10.1 10*3/uL (ref 4.0–10.5)
nRBC: 0 % (ref 0.0–0.2)

## 2023-07-06 MED ORDER — HALOPERIDOL LACTATE 5 MG/ML IJ SOLN
5.0000 mg | Freq: Once | INTRAMUSCULAR | Status: AC
  Administered 2023-07-07: 5 mg via INTRAMUSCULAR
  Filled 2023-07-06: qty 1

## 2023-07-06 MED ORDER — OXYCODONE HCL 5 MG PO TABS
10.0000 mg | ORAL_TABLET | Freq: Four times a day (QID) | ORAL | Status: DC
  Administered 2023-07-06 – 2023-07-08 (×8): 10 mg
  Filled 2023-07-06 (×8): qty 2

## 2023-07-06 NOTE — Consult Note (Signed)
 Seen for follow up: Heavily sedated and sleeping upon approach. To follow up tomorrow.   Arlie Lain, MD  Attending psychiatrist.

## 2023-07-06 NOTE — Progress Notes (Addendum)
 eLink Physician-Brief Progress Note Patient Name: Jermaine [name] DOB: 11/04/1965 MRN: [id number]   Date of Service  07/06/2023  HPI/Events of Note  58 year old male that presented initially with acute encephalopathy currently on numerous sedating medications including oxycodone  scheduled, lorazepam  scheduled, olanzapine  scheduled, phenobarbital  scheduled, melatonin scheduled, valproic  acid scheduled and Precedex .  This may be mild, ankle restraints and mittens, the patient was able to wiggle himself off the bed and landed on his side onto the fall mats.  No head trauma noted.  No change in neurological status.  Appears comfortable on video examination.  eICU Interventions  No immediate changes to plan.  Continue observation    2346 -patient is in 4 point restraints after receiving his nightly Ativan , Zyprexa , oxycodone , melatonin as core track.  He pulled out all of his IVs and is unable to receive his phenobarbital  IV.  Currently not on Precedex .  Will add Haldol  IM times once.  0439 - Pt BP soft 86/59 (67) even after turning down Precedex  0.8.  Utilize low-dose norepinephrine  if MAP is less than 65  Intervention Category Intermediate Interventions: Other:  Chantal Worthey 07/06/2023, 9:50 PM

## 2023-07-06 NOTE — Progress Notes (Signed)
 NAME:  Jermaine [name], MRN:  [id number], DOB:  1965/06/17, LOS: 11 ADMISSION DATE:  06/25/2023, CONSULTATION DATE:  06/26/23 REFERRING MD:  Bryn CHIEF COMPLAINT:  EtOH Withdrawal   BRIEF  Jermaine [name] is a 58 y.o. male who has a PMH as below including but not limited to anxiety, depression, EtOH dependence. He presented to urgent care 1/28 with confusion, tremors, anxiety, presyncopal symptoms. He was found to have tachycardia, tachypnea, hypoxia and was subsequently told to come to the ED for further evaluaiton.  In ED, he had persistent symptoms. He informed staff that his last drink was earlier that day. He normally drinks 1 - 2 pints Titos every day or every other day along with 2 - 4 White Claws. On day of presentation, he believes he only drank [employer] and no liquor.  He was found to have ethanol of 283 and despite Ativan  and Versed , continued to have symptoms. He was subsequently admitted by [facility] for EtOH withdrawal and was started on Precedex  and Ativan  per CIWA. Head CT and UDS negative. He denies any recreational drug use. He has had withdrawal in the past requiring hospitalization. Appears just admitted to this facility 05/20/23 through 05/24/23 for the same as well as suicidal/homicidal ideation. He was IVC'd at the time and was seen by psych who cleared him for discharge. Per chart review, he has had multiple admissions for the same with 5 admissions in the last 6 months.  On PM 1/29, he remained on 0.4 Precedex ; therefore, PCCM asked to assist with his care. He is A&O x 3, no deficits. He had some hallucinations this AM but currently is lucid and appropriately interactive in conversation. He tells me that he has a job interview tomorrow (Thursday 1/30) which he would really like to not miss. I informed him that he would not be discharged by then and recommended he call the employer and request a delay in his interview.  Pertinent  Medical History:  has Alcohol  abuse with intoxication  (HCC); Alcohol  abuse; Hepatic steatosis; Anxiety and depression; Sinus tachycardia; Class 1 obesity; Secondary polycythemia; DTs (delirium tremens) (HCC); Alcohol  withdrawal (HCC); Facial fracture due to fall [facility]); Orbital floor (blow-out) closed fracture (HCC); Alcohol -induced mood disorder (HCC); C. difficile colitis; and Acute hypoxic respiratory failure (HCC) on their problem list.  Significant Hospital Events: Including procedures, antibiotic start and stop dates in addition to other pertinent events   1/28 admit 1/29 PCCM consult 1/31 - Unable to wean precedex  thus far, up to 1.8 dex this am.  Either sedated or agitated, swinging at staff this am 2/1 - 06/29/2023: Required Haldol  and even additional phenobarb.  This is despite being on Precedex  infusion.  Currently afebrile with a normal white count.  Intermittent agitation overnight.  Continued on Precedex  getting scheduled Haldol  2/3: nasogastric tube placed. Focus changed to treating NARD over DT. Added scheduled seroquel , clonidine  taper and librium  taper. Allowing pheno to taper off on current schedule initially was weaned off from the Precedex , but then got agitated had once again was rapidly titrated back up to ceiling dose 2/4 back on dex. Still agitated at times, kicked nursing staff during the evening hours of 2/3 Seroquel  increased  Interim History / Subjective:  Sleeping comfortably, sonorous respirations  Objective:  Blood pressure (S) 98/60, pulse (!) 53, temperature 98.5 F (36.9 C), temperature source Axillary, resp. rate (!) 24, height 5' 10 (1.778 m), weight 89 kg, SpO2 90%.        Intake/Output Summary (Last 24  hours) at 07/06/2023 1024 Last data filed at 07/06/2023 0800 Gross per 24 hour  Intake 1290.49 ml  Output 1200 ml  Net 90.49 ml   Filed Weights   07/04/23 0500 07/05/23 0500 07/05/23 0800  Weight: 86 kg 86 kg 89 kg   Examination:  General:  chronically ill appearing older male lying in bed sleeping HEENT:  MM pink/moist, Neuro: Drowsy, arousable CV: rr, no murmur PULM:  non labored, clear, RA GI: soft, bs+, NT, purwick Extremities: warm/dry, no LE edema  Skin: no rashes     Labs/imaging personally reviewed:  CT head 1/29 > neg.  Assessment & Plan:   Acute alcohol  withdrawal with DTs and now ongoing Acute metabolic encephalopathy due to nonalcohol related delirium (NARD)  - appears withdrawal started around the 28th, ETOH 283 on admit, now out of window for alcohol  withdrawal - initial phenobarb and librium  taper completed 2/6 Plan - appreciate psych input 2/7 -Start manual Precedex  wean, decrease ceiling to 1.0 2/8 - cont olanzapine  15mg  BID and depakote  500mg  BID and melatonin started 2/6 PM for worsening agitation - Continue ativan  2 mg PO TID - Phenobarb taper re-started 2/7 - Qtc remains wnl, monitor - prn hydroxyzine  - cont wrist restraints - empiric thiamine / folic/ MVI - delirium precautions - monitor for urinary retention  Acute hypoxic respiratory failure, resolved - likely having component of atelectasis.  Currently on RA.  PRN O2 qHS - pulm hygiene, IS, mobilize when safe/ PT  Query undiagnosed OSA - Consider outpt f/u  Fluid and electrolyte imbalance: Hypokalemia, resolved - trend BMET  At risk for malnutrition - cont TF for now for consistency, once more oriented and not requiring restraints, can start orals - cont lactulose  BID for BM, last BM on 1/6, dulcolax prn  Fever - tmax 100.7 2/6, no fever since without intervention  Best practice (evaluated daily):  Diet/type: NPO; TF per cortrak DVT prophylaxis: LMWH Pressure ulcer(s): pressure ulcer assessment deferred > n/a GI prophylaxis: N/A Lines: N/A Foley:  N/A Code Status:  full code Last date of multidisciplinary goals of care discussion: pending, no family available  CRITICAL CARE Performed by: Donnice SAUNDERS Godfrey Tritschler   Total critical care time: 31 minutes  Critical care time was exclusive of  separately billable procedures and treating other patients.  Critical care was necessary to treat or prevent imminent or life-threatening deterioration.  Critical care was time spent personally by me on the following activities: development of treatment plan with patient and/or surrogate as well as nursing, discussions with consultants, evaluation of patient's response to treatment, examination of patient, obtaining history from patient or surrogate, ordering and performing treatments and interventions, ordering and review of laboratory studies, ordering and review of radiographic studies, pulse oximetry and re-evaluation of patient's condition.   Donnice SAUNDERS Beals, MD  Pulmonary & Critical Care 07/06/2023, 10:24 AM  See Amion for contact info If no response, please call [phone] until 7pm After 7:00 pm call Elink  [phone]

## 2023-07-06 NOTE — Plan of Care (Signed)
   Problem: Education: Goal: Knowledge of General Education information will improve Description: Including pain rating scale, medication(s)/side effects and non-pharmacologic comfort measures Outcome: Not Progressing   Problem: Health Behavior/Discharge Planning: Goal: Ability to manage health-related needs will improve Outcome: Not Progressing

## 2023-07-07 DIAGNOSIS — F10932 Alcohol use, unspecified with withdrawal with perceptual disturbance: Secondary | ICD-10-CM | POA: Diagnosis not present

## 2023-07-07 DIAGNOSIS — G9341 Metabolic encephalopathy: Secondary | ICD-10-CM | POA: Diagnosis not present

## 2023-07-07 DIAGNOSIS — R509 Fever, unspecified: Secondary | ICD-10-CM | POA: Diagnosis not present

## 2023-07-07 LAB — GLUCOSE, CAPILLARY
Glucose-Capillary: 97 mg/dL (ref 70–99)
Glucose-Capillary: 133 mg/dL — ABNORMAL HIGH (ref 70–99)
Glucose-Capillary: 135 mg/dL — ABNORMAL HIGH (ref 70–99)
Glucose-Capillary: 89 mg/dL (ref 70–99)
Glucose-Capillary: 128 mg/dL — ABNORMAL HIGH (ref 70–99)

## 2023-07-07 MED ORDER — NOREPINEPHRINE 4 MG/250ML-% IV SOLN
2.0000 ug/min | INTRAVENOUS | Status: DC
  Administered 2023-07-07: 2 ug/min via INTRAVENOUS
  Administered 2023-07-07: 3 ug/min via INTRAVENOUS
  Filled 2023-07-07 (×2): qty 250

## 2023-07-07 MED ORDER — HALOPERIDOL LACTATE 5 MG/ML IJ SOLN
5.0000 mg | Freq: Three times a day (TID) | INTRAMUSCULAR | Status: DC | PRN
  Administered 2023-07-08 – 2023-07-11 (×6): 5 mg via INTRAVENOUS
  Filled 2023-07-07 (×7): qty 1

## 2023-07-07 MED ORDER — SODIUM CHLORIDE 0.9 % IV SOLN: 250.0000 mL | INTRAVENOUS | Status: AC

## 2023-07-07 MED ORDER — LACTATED RINGERS IV BOLUS
1000.0000 mL | Freq: Once | INTRAVENOUS | Status: AC
  Administered 2023-07-07: 1000 mL via INTRAVENOUS

## 2023-07-07 NOTE — Progress Notes (Signed)
   07/06/23 2111  What Happened  Was fall witnessed? No  Was patient injured? No  Patient found on floor  Found by Staff-comment Catering Manager, RN)  Stated prior activity other (comment) (Patient stated he had to get to his sister's wedding)  Provider Notification  Provider Name/Title Ria Hemming  Date Provider Notified 07/06/23  Time Provider Notified 2114  Method of Notification Page  Notification Reason Fall  Provider response No new orders;Evaluate remotely  Date of Provider Response 07/06/23  Time of Provider Response 2149  Follow Up  Family notified Yes - comment  Time family notified 0200  Additional tests No  Adult Fall Risk Assessment  Risk Factor Category (scoring not indicated) High fall risk per protocol (document High fall risk)  Patient Fall Risk Level High fall risk  Adult Fall Risk Interventions  Required Bundle Interventions *See Row Information* High fall risk - low, moderate, and high requirements implemented  Additional Interventions Room near nurses station;Use of appropriate toileting equipment (bedpan, BSC, etc.);HeadStart bed sensor (education/return demonstration)  Screening for Fall Injury Risk (To be completed on HIGH fall risk patients) - Assessing Need for Floor Mats  Risk For Fall Injury- Criteria for Floor Mats Confusion/dementia (+NuDESC, CIWA, TBI, etc.)  Will Implement Floor Mats Yes  Vitals  BP 122/84  MAP (mmHg) 98  BP Location Right Arm  BP Method Automatic  Patient Position (if appropriate) Lying  Pulse Rate (!) 104  Pulse Rate Source Monitor  ECG Heart Rate (!) 104  Cardiac Rhythm ST  Resp (!) 21  Oxygen Therapy  SpO2 97 %  O2 Device Room Air  Patient Activity (if Appropriate) In bed  Pulse Oximetry Type Continuous  Oximetry Probe Site Changed Yes  Pain Assessment  Pain Scale 0-10  Pain Score 0  Neurological  Neuro (WDL) X  Level of Consciousness Alert  Orientation Level Oriented to person;Disoriented to situation;Disoriented  to place;Oriented to time  Cognition Follows commands;Impulsive;Poor attention/concentration;Poor judgement;Poor safety awareness  Speech Clear  R Pupil Size (mm) 3  R Pupil Shape Round  R Pupil Reaction Brisk  L Pupil Size (mm) 3  L Pupil Shape Round  L Pupil Reaction Brisk  Motor Function/Sensation Assessment Grip  R Hand Grip Strong  L Hand Grip Strong  RUE Motor Response Purposeful movement;Responds to commands  LUE Motor Response Purposeful movement;Responds to commands  RLE Motor Response Purposeful movement;Responds to commands  LLE Motor Response Purposeful movement;Responds to commands  Neuro Symptoms Tremors;Drowsiness  Neuro symptoms relieved by Rest  Tremors  Tremor Location Hands  Tremor Severity Moderate  Tremor Duration  (continuous)  Glasgow Coma Scale  Eye Opening 4  Best Verbal Response (NON-intubated) 4  Best Motor Response 6  Glasgow Coma Scale Score 14  Musculoskeletal  Musculoskeletal (WDL) X  Assistive Device None  Generalized Weakness Yes  Weight Bearing Restrictions Per Provider Order No  Integumentary  Integumentary (WDL) X  Skin Color Appropriate for ethnicity  Skin Condition Dry  Skin Integrity Ecchymosis;Erythema/redness  Ecchymosis Location Arm  Ecchymosis Location Orientation Bilateral  Erythema/Redness Location Arm  Erythema/Redness Location Orientation Right;Left  Skin Turgor Non-tenting

## 2023-07-07 NOTE — Consult Note (Signed)
 Jermaine [name] Psychiatry Consult Evaluation  Service Date: July 07, 2023 LOS:  LOS: 12 days    Primary Psychiatric Diagnoses  Hyperactive delirium 2.  Alcohol  use disorder 3.  Depression  Assessment  Jermaine [name] is a 58 y.o. male admitted medically for 06/25/2023  5:56 PM for alcohol  withdrawal. He carries the psychiatric diagnoses of alcohol  use disorder, depression and anxiety. Psychiatry was consulted for management of alcohol  withdrawal and agitation.  Per chart, the patient was admitted on 1/28   His BAL was 283 on admission, and was given Ativan  and Versed  with minimal improvement in withdrawal.  He was admitted and was started on precedex , and was difficult to wean off.  He had multiple hospitalizations in the past with similar presentations including DT.   On assessment today the patient remains delusional and appears to be responding to internal stimuli. He made a strange comment about needing to change his uniform and he is unaware of where he is. Per nursing staff the patient has not made much progress with the psychiatric medications that he has been given and they are continuing to try and wean the patient off Precedex . When his dose is decreased the patient becomes more physically aggressive with staff.   07/07/23: Patient seen in his hospital room. He is sleeping and appears heavily sedated, and did not wake up despite multiple trials. Collateral information from the treating nurse (Jermaine [name]) reports that patient was awake, pleasant and communicating with her earlier today. The nurse denies any overnight aggressive or irritable behavior, and reports patient has been calm on current medication regimen (olanzapine  15 mg twice daily). She states further that patient is being gradually weaned off Precedex  per the primary team.    Diagnoses:  Active Hospital problems: Principal Problem:   Alcohol  withdrawal (HCC) Active Problems:   DTs (delirium tremens) (HCC)    Acute hypoxic respiratory failure (HCC)     Plan   ## Psychiatric Medication Recommendations:  Alcohol  withdrawal Hyperactive delirium - Delirium precautions - Continue Precedex ; continue to wean as able - Continue Olanzapine  15 mg BID  - Discontinue Seroquel  200 mg PO BID - Continue Depakote  500 mg BID  - Continue Thiamine /Folic acid /multivitamins.  -- Continue Melatonin nightly -- Once his delirium improves, goal is to taper off antipsychotics before discharge   ## Medical Decision Making Capacity:  - None  ## Further Work-up:  -- Monitor labs regularly    ## Disposition:  -- Deferred   ## Behavioral / Environmental:  --  Delirium precautions    ## Safety and Observation Level:  - Based on my clinical evaluation, I estimate the patient to be at low risk of self harm in the current setting - At this time, we recommend a intermediate level of observation. This decision is based on my review of the chart including patient's history and current presentation, interview of the patient, mental status examination, and consideration of suicide risk including evaluating suicidal ideation, plan, intent, suicidal or self-harm behaviors, risk factors, and protective factors. This judgment is based on our ability to directly address suicide risk, implement suicide prevention strategies and develop a safety plan while the patient is in the clinical setting. Please contact our team if there is a concern that risk level has changed.  Suicide risk assessment  Patient has following modifiable risk factors for suicide: delirium which we are addressing by psychopharm and non-psychopharm approaches  Patient has following non-modifiable or demographic risk factors for suicide: male gender and  psychiatric hospitalization  Patient has the following protective factors against suicide: no history of suicide attempts and no history of NSSIB   Thank you for this consult request. Recommendations  have been communicated to the primary team.  We will continue to follow up at this time.   Jermaine DELENA Donath, MD  Psychiatric and Social History   Relevant Aspects of Hospital Course:    Psych ROS:  not completed due to patient's current mental status    Exam Findings   Psychiatric Specialty Exam:  Presentation  General Appearance: Disheveled  Eye Contact:Fleeting  Speech:Garbled  Speech Volume:Decreased  Handedness:Ambidextrous   Mood and Affect  Mood:Labile  Affect:Non-Congruent   Thought Process  Thought Processes:Disorganized  Descriptions of Associations:Loose  Orientation:Partial  Thought Content:Delusions; Illogical  Hallucinations:No data recorded  Ideas of Reference:No data recorded Suicidal Thoughts:No data recorded  Homicidal Thoughts:No data recorded   Sensorium  Memory:Other (comment)  Judgment:Poor  Insight:Poor   Executive Functions  Concentration:Poor  Attention Span:Poor  Recall:Poor  Fund of Knowledge:Poor  Language:Poor   Psychomotor Activity  Psychomotor Activity:No data recorded   Assets  Assets:No data recorded  Sleep  Sleep:Sleep: Good    Physical Exam: Vital signs:  Temp:  [97.4 F (36.3 C)-99.9 F (37.7 C)] 98.2 F (36.8 C) (02/09 1320) Pulse Rate:  [59-146] 59 (02/09 1400) Resp:  [12-34] 19 (02/09 1400) BP: (74-216)/(46-111) 105/71 (02/09 1400) SpO2:  [85 %-100 %] 99 % (02/09 1400) Weight:  [86.7 kg] 86.7 kg (02/09 0500) Physical Exam  Blood pressure 105/71, pulse (!) 59, temperature 98.2 F (36.8 C), temperature source Axillary, resp. rate 19, height 5' 10 (1.778 m), weight 86.7 kg, SpO2 99%. Body mass index is 27.43 kg/m.   Other History   These have been pulled in through the EMR, reviewed, and updated if appropriate.   Family History:   The patient's family history is not on file. He was adopted.  Medical History: Past Medical History:  Diagnosis Date  . Alcohol  abuse   .  Anxiety   . Class 1 obesity 09/14/2021  . Depression     Surgical History: Past Surgical History:  Procedure Laterality Date  . HERNIA REPAIR      Medications:   Current Facility-Administered Medications:  .  0.9 %  sodium chloride  infusion, 250 mL, Intravenous, Continuous, Paliwal, Aditya, MD, Stopped at 07/07/23 9167 .  acetaminophen  (TYLENOL ) tablet 650 mg, 650 mg, Per Tube, Q6H PRN, 650 mg at 07/04/23 2125 **OR** acetaminophen  (TYLENOL ) suppository 650 mg, 650 mg, Rectal, Q6H PRN, Desai, Nikita S, MD .  bisacodyl  (DULCOLAX) suppository 10 mg, 10 mg, Rectal, Daily PRN, Antonetta Moccasin B, NP .  Chlorhexidine  Gluconate Cloth 2 % PADS 6 each, 6 each, Topical, Daily, Opyd, Evalene RAMAN, MD, 6 each at 07/06/23 1100 .  dexmedetomidine  (PRECEDEX ) 400 MCG/100ML (4 mcg/mL) infusion, 0.8 mcg/kg/hr, Intravenous, Continuous, Hunsucker, Donnice SAUNDERS, MD, Last Rate: 18.14 mL/hr at 07/07/23 1347, 0.8 mcg/kg/hr at 07/07/23 1347 .  sennosides (SENOKOT) 8.8 MG/5ML syrup 5 mL, 5 mL, Per Tube, QHS PRN **AND** docusate (COLACE) 50 MG/5ML liquid 50 mg, 50 mg, Per Tube, QHS PRN, Desai, Nikita S, MD, 50 mg at 07/04/23 2125 .  feeding supplement (OSMOLITE 1.5 CAL) liquid 1,000 mL, 1,000 mL, Per Tube, Continuous, Meade Verdon RAMAN, MD, Last Rate: 60 mL/hr at 07/07/23 0832, Infusion Verify at 07/07/23 0832 .  feeding supplement (PROSource TF20) liquid 60 mL, 60 mL, Per Tube, Daily, Jenna Coy E, NP, 60 mL at 07/07/23 1038 .  folic acid  (FOLVITE ) tablet 1 mg, 1 mg, Per Tube, Daily, Desai, Nikita S, MD, 1 mg at 07/07/23 1037 .  haloperidol  lactate (HALDOL ) injection 5 mg, 5 mg, Intravenous, Q8H PRN, Hunsucker, Donnice SAUNDERS, MD .  hydrALAZINE  (APRESOLINE ) injection 10 mg, 10 mg, Intravenous, Q6H PRN, Paliwal, Aditya, MD, 10 mg at 06/30/23 2218 .  lactulose  (CHRONULAC ) 10 GM/15ML solution 10 g, 10 g, Per Tube, BID, Antonetta Moccasin B, NP, 10 g at 07/07/23 1038 .  loperamide  HCl (IMODIUM ) 1 MG/7.5ML suspension 2-4 mg, 2-4 mg, Per  Tube, PRN, Desai, Nikita S, MD .  LORazepam  (ATIVAN ) tablet 2 mg, 2 mg, Per Tube, TID, Antonetta Moccasin B, NP, 2 mg at 07/07/23 1037 .  melatonin tablet 3 mg, 3 mg, Per Tube, QHS, Erskine Girt, MD, 3 mg at 07/06/23 2248 .  multivitamin with minerals tablet 1 tablet, 1 tablet, Per Tube, Daily, Desai, Nikita S, MD, 1 tablet at 07/07/23 1038 .  norepinephrine  (LEVOPHED ) 4mg  in (0.016 mg/mL) premix infusion, 2-10 mcg/min, Intravenous, Titrated, Paliwal, Aditya, MD, Last Rate: 7.5 mL/hr at 07/07/23 0832, 2 mcg/min at 07/07/23 9167 .  OLANZapine  zydis (ZYPREXA ) disintegrating tablet 15 mg, 15 mg, Per Tube, BID, Mitchell, Jerrell L, DO, 15 mg at 07/07/23 1037 .  Oral care mouth rinse, 15 mL, Mouth Rinse, PRN, Kassie Acquanetta Bradley, MD .  oxyCODONE  (Oxy IR/ROXICODONE ) immediate release tablet 10 mg, 10 mg, Per Tube, Q6H, Hunsucker, Donnice SAUNDERS, MD, 10 mg at 07/07/23 1230 .  polyethylene glycol (MIRALAX  / GLYCOLAX ) packet 17 g, 17 g, Per Tube, Daily, Jenna Maude BRAVO, NP, 17 g at 07/07/23 1039 .  sodium chloride  flush (NS) 0.9 % injection 10 mL, 10 mL, Intravenous, PRN, Geronimo Amel, MD .  sodium chloride  flush (NS) 0.9 % injection 3 mL, 3 mL, Intravenous, Q12H, Opyd, Timothy S, MD, 3 mL at 07/07/23 1039 .  thiamine  (VITAMIN B1) tablet 100 mg, 100 mg, Per Tube, Daily, 100 mg at 07/07/23 1037 **OR** thiamine  (VITAMIN B1) injection 100 mg, 100 mg, Intravenous, Daily, Desai, Nikita S, MD, 100 mg at 07/06/23 1013 .  valproic  acid (DEPAKENE ) 250 MG/5ML solution 500 mg, 500 mg, Per Tube, BID, Antonetta Moccasin B, NP, 500 mg at 07/07/23 1037  Allergies: Allergies  Allergen Reactions  . Ketamine      Hx of causing fever in 2024   . Codeine Itching

## 2023-07-07 NOTE — Plan of Care (Signed)
   Problem: Education: Goal: Knowledge of General Education information will improve Description: Including pain rating scale, medication(s)/side effects and non-pharmacologic comfort measures Outcome: Not Progressing   Problem: Health Behavior/Discharge Planning: Goal: Ability to manage health-related needs will improve Outcome: Not Progressing

## 2023-07-07 NOTE — Progress Notes (Signed)
 NAME:  Jermaine [name], MRN:  [id number], DOB:  07/02/1965, LOS: 12 ADMISSION DATE:  06/25/2023, CONSULTATION DATE:  06/26/23 REFERRING MD:  Bryn CHIEF COMPLAINT:  EtOH Withdrawal   BRIEF  Jermaine [name] is a 58 y.o. male who has a PMH as below including but not limited to anxiety, depression, EtOH dependence. He presented to urgent care 1/28 with confusion, tremors, anxiety, presyncopal symptoms. He was found to have tachycardia, tachypnea, hypoxia and was subsequently told to come to the ED for further evaluaiton.  In ED, he had persistent symptoms. He informed staff that his last drink was earlier that day. He normally drinks 1 - 2 pints Titos every day or every other day along with 2 - 4 White Claws. On day of presentation, he believes he only drank [employer] and no liquor.  He was found to have ethanol of 283 and despite Ativan  and Versed , continued to have symptoms. He was subsequently admitted by [facility] for EtOH withdrawal and was started on Precedex  and Ativan  per CIWA. Head CT and UDS negative. He denies any recreational drug use. He has had withdrawal in the past requiring hospitalization. Appears just admitted to this facility 05/20/23 through 05/24/23 for the same as well as suicidal/homicidal ideation. He was IVC'd at the time and was seen by psych who cleared him for discharge. Per chart review, he has had multiple admissions for the same with 5 admissions in the last 6 months.  On PM 1/29, he remained on 0.4 Precedex ; therefore, PCCM asked to assist with his care. He is A&O x 3, no deficits. He had some hallucinations this AM but currently is lucid and appropriately interactive in conversation. He tells me that he has a job interview tomorrow (Thursday 1/30) which he would really like to not miss. I informed him that he would not be discharged by then and recommended he call the employer and request a delay in his interview.  Pertinent  Medical History:  has Alcohol  abuse with intoxication  (HCC); Alcohol  abuse; Hepatic steatosis; Anxiety and depression; Sinus tachycardia; Class 1 obesity; Secondary polycythemia; DTs (delirium tremens) (HCC); Alcohol  withdrawal (HCC); Facial fracture due to fall [facility]); Orbital floor (blow-out) closed fracture (HCC); Alcohol -induced mood disorder (HCC); C. difficile colitis; and Acute hypoxic respiratory failure (HCC) on their problem list.  Significant Hospital Events: Including procedures, antibiotic start and stop dates in addition to other pertinent events   1/28 admit 1/29 PCCM consult 1/31 - Unable to wean precedex  thus far, up to 1.8 dex this am.  Either sedated or agitated, swinging at staff this am 2/1 - 06/29/2023: Required Haldol  and even additional phenobarb.  This is despite being on Precedex  infusion.  Currently afebrile with a normal white count.  Intermittent agitation overnight.  Continued on Precedex  getting scheduled Haldol  2/3: nasogastric tube placed. Focus changed to treating NARD over DT. Added scheduled seroquel , clonidine  taper and librium  taper. Allowing pheno to taper off on current schedule initially was weaned off from the Precedex , but then got agitated had once again was rapidly titrated back up to ceiling dose 2/4 back on dex. Still agitated at times, kicked nursing staff during the evening hours of 2/3 Seroquel  increased 2/9 blood pressure dropped overnight, Precedex  was decreased rapidly and off.  Norepinephrine  was started.  Shortly thereafter he developed significant hypertension tachycardia sweating likely Precedex  withdrawal.  Blood pressure significantly elevated.  Interim History / Subjective:  blood pressure dropped overnight, Precedex  was decreased rapidly and off.  Norepinephrine  was started.  Shortly thereafter he developed significant hypertension tachycardia sweating likely Precedex  withdrawal.  Blood pressure significantly elevated.  Objective:  Blood pressure 107/73, pulse 67, temperature 98.4 F (36.9 C),  temperature source Axillary, resp. rate 20, height 5' 10 (1.778 m), weight 86.7 kg, SpO2 99%.        Intake/Output Summary (Last 24 hours) at 07/07/2023 1107 Last data filed at 07/07/2023 1000 Gross per 24 hour  Intake 1926.63 ml  Output 2100 ml  Net -173.37 ml   Filed Weights   07/05/23 0500 07/05/23 0800 07/07/23 0500  Weight: 86 kg 89 kg 86.7 kg   Examination:  General:  chronically ill appearing older male lying in bed sleeping HEENT: MM pink/moist, Neuro: Drowsy, arousable CV: rr, no murmur PULM:  non labored, clear, RA GI: soft, bs+, NT, purwick Extremities: warm/dry, no LE edema  Skin: no rashes     Labs/imaging personally reviewed:  CT head 1/29 > neg.  Assessment & Plan:   Acute alcohol  withdrawal with DTs and now ongoing Acute metabolic encephalopathy due to nonalcohol related delirium (NARD)  - appears withdrawal started around the 28th, ETOH 283 on admit, now out of window for alcohol  withdrawal - initial phenobarb and librium  taper completed 2/6 Plan - appreciate psych input  -Start manual Precedex  wean, decrease ceiling to 0.8 2/9 - cont olanzapine  15mg  BID and depakote  500mg  BID and melatonin started 2/6 PM for worsening agitation - Continue ativan  2 mg PO TID - Phenobarb taper re-started 2/7 - Qtc remains wnl, monitor - prn hydroxyzine  - cont wrist restraints - empiric thiamine / folic/ MVI - delirium precautions - monitor for urinary retention  Acute hypertension tachycardia diaphoresis: Related to rapid decrease in Precedex  with concern for low blood pressure.  He seems asymptomatic.  From a low blood pressure standpoint.  Precedex  restarted with improvement. -- Slow Precedex  wean as above, we will not be able to stop this acutely given how long he has been on it he will withdrawal and he cannot tolerate clonidine  for withdrawal symptoms given borderline blood pressures  Hypotension: Unclear etiology.  Suspect low blood pressure with sleeping.  Seems  asymptomatic.  Likely some element of hypovolemia given poor p.o. intake, only tube feeds.  Likely not keeping up with insensible losses. -- 1 L LR bolus -- Norepinephrine , wean as tolerated MAP goal greater than 65  Acute hypoxic respiratory failure, resolved - likely having component of atelectasis.  Currently on RA.  PRN O2 qHS - pulm hygiene, IS, mobilize when safe/ PT  Query undiagnosed OSA - Consider outpt f/u  Fluid and electrolyte imbalance: Hypokalemia, resolved - trend BMET  At risk for malnutrition - cont TF for now for consistency, once more oriented and not requiring restraints, can start orals - cont lactulose  BID for BM, last BM on 1/6, dulcolax prn  Fever - tmax 100.7 2/6, no fever since without intervention  Best practice (evaluated daily):  Diet/type: NPO; TF per cortrak DVT prophylaxis: LMWH Pressure ulcer(s): pressure ulcer assessment deferred > n/a GI prophylaxis: N/A Lines: N/A Foley:  N/A Code Status:  full code Last date of multidisciplinary goals of care discussion: pending, no family available  CRITICAL CARE Performed by: Donnice SAUNDERS Sora Vrooman   Total critical care time: 35 minutes  Critical care time was exclusive of separately billable procedures and treating other patients.  Critical care was necessary to treat or prevent imminent or life-threatening deterioration.  Critical care was time spent personally by me on the following activities: development of treatment plan  with patient and/or surrogate as well as nursing, discussions with consultants, evaluation of patient's response to treatment, examination of patient, obtaining history from patient or surrogate, ordering and performing treatments and interventions, ordering and review of laboratory studies, ordering and review of radiographic studies, pulse oximetry and re-evaluation of patient's condition.   Donnice JONELLE Beals, MD [facility] 07/07/2023, 11:07 AM  See  Amion for contact info If no response, please call [phone] until 7pm After 7:00 pm call Elink  [phone]

## 2023-07-08 DIAGNOSIS — R451 Restlessness and agitation: Secondary | ICD-10-CM | POA: Diagnosis not present

## 2023-07-08 DIAGNOSIS — F10931 Alcohol use, unspecified with withdrawal delirium: Secondary | ICD-10-CM | POA: Diagnosis not present

## 2023-07-08 DIAGNOSIS — R1310 Dysphagia, unspecified: Secondary | ICD-10-CM

## 2023-07-08 LAB — GLUCOSE, CAPILLARY
Glucose-Capillary: 109 mg/dL — ABNORMAL HIGH (ref 70–99)
Glucose-Capillary: 112 mg/dL — ABNORMAL HIGH (ref 70–99)
Glucose-Capillary: 116 mg/dL — ABNORMAL HIGH (ref 70–99)
Glucose-Capillary: 137 mg/dL — ABNORMAL HIGH (ref 70–99)
Glucose-Capillary: 86 mg/dL (ref 70–99)
Glucose-Capillary: 90 mg/dL (ref 70–99)

## 2023-07-08 LAB — BASIC METABOLIC PANEL
Anion gap: 9 (ref 5–15)
BUN: 20 mg/dL (ref 6–20)
CO2: 32 mmol/L (ref 22–32)
Calcium: 9.1 mg/dL (ref 8.9–10.3)
Chloride: 96 mmol/L — ABNORMAL LOW (ref 98–111)
Creatinine, Ser: 0.92 mg/dL (ref 0.61–1.24)
GFR, Estimated: >60 mL/min (ref >60)
Glucose, Bld: 105 mg/dL — ABNORMAL HIGH (ref 70–99)
Potassium: 4.8 mmol/L (ref 3.5–5.1)
Sodium: 137 mmol/L (ref 135–145)

## 2023-07-08 MED ORDER — PHENOBARBITAL SODIUM 130 MG/ML IJ SOLN
130.0000 mg | Freq: Two times a day (BID) | INTRAMUSCULAR | Status: DC
  Administered 2023-07-08 (×2): 130 mg via INTRAVENOUS
  Filled 2023-07-08 (×2): qty 1

## 2023-07-08 MED ORDER — THIAMINE HCL 100 MG/ML IJ SOLN
500.0000 mg | Freq: Three times a day (TID) | INTRAVENOUS | Status: AC
  Administered 2023-07-08 – 2023-07-09 (×6): 500 mg via INTRAVENOUS
  Filled 2023-07-08 (×7): qty 5

## 2023-07-08 MED ORDER — GABAPENTIN 250 MG/5ML PO SOLN
200.0000 mg | Freq: Three times a day (TID) | ORAL | Status: DC
  Administered 2023-07-08 – 2023-07-10 (×8): 200 mg
  Filled 2023-07-08 (×8): qty 4

## 2023-07-08 MED ORDER — OXYCODONE HCL 5 MG PO TABS
15.0000 mg | ORAL_TABLET | Freq: Four times a day (QID) | ORAL | Status: DC
  Administered 2023-07-08 – 2023-07-10 (×9): 15 mg
  Filled 2023-07-08 (×9): qty 3

## 2023-07-08 MED ORDER — METOPROLOL TARTRATE 25 MG PO TABS
25.0000 mg | ORAL_TABLET | Freq: Two times a day (BID) | ORAL | Status: DC
  Administered 2023-07-08 – 2023-07-10 (×5): 25 mg
  Filled 2023-07-08 (×5): qty 1

## 2023-07-08 MED ORDER — THIAMINE MONONITRATE 100 MG PO TABS
100.0000 mg | ORAL_TABLET | Freq: Every day | ORAL | Status: DC
  Administered 2023-07-10: 100 mg
  Filled 2023-07-08: qty 1

## 2023-07-08 NOTE — Progress Notes (Signed)
 eLink Physician-Brief Progress Note Patient Name: Jermaine [name] DOB: February 22, 1966 MRN: [id number]   Date of Service  07/08/2023  HPI/Events of Note  Persistent Agitation  eICU Interventions  Renew Restraints as needed     Intervention Category Minor Interventions: Agitation / anxiety - evaluation and management  Mabeline Varas 07/08/2023, 1:11 AM

## 2023-07-08 NOTE — Plan of Care (Signed)
  Problem: Clinical Measurements: Goal: Diagnostic test results will improve Outcome: Progressing Goal: Respiratory complications will improve Outcome: Progressing Goal: Cardiovascular complication will be avoided Outcome: Progressing   Problem: Pain Managment: Goal: General experience of comfort will improve and/or be controlled Outcome: Progressing   Problem: Safety: Goal: Ability to remain free from injury will improve Outcome: Progressing

## 2023-07-08 NOTE — Progress Notes (Signed)
 NAME:  Jermaine [name], MRN:  [id number], DOB:  1965/11/18, LOS: 13 ADMISSION DATE:  06/25/2023, CONSULTATION DATE:  06/26/23 REFERRING MD:  Fausto Hooker CHIEF COMPLAINT:  EtOH Withdrawal   BRIEF  Jermaine [name] is a 58 y.o. male who has a PMH as below including but not limited to anxiety, depression, EtOH dependence. He presented to urgent care 1/28 with confusion, tremors, anxiety, presyncopal symptoms. He was found to have tachycardia, tachypnea, hypoxia and was subsequently told to come to the ED for further evaluaiton.  In ED, he had persistent symptoms. He informed staff that his last drink was earlier that day. He normally drinks 1 - 2 pints Titos every day or every other day along with 2 - 4 White Claws. On day of presentation, he believes he only drank [employer] and no liquor.  He was found to have ethanol of 283 and despite Ativan  and Versed , continued to have symptoms. He was subsequently admitted by [facility] for EtOH withdrawal and was started on Precedex  and Ativan  per CIWA. Head CT and UDS negative. He denies any recreational drug use. He has had withdrawal in the past requiring hospitalization. Appears just admitted to this facility 05/20/23 through 05/24/23 for the same as well as suicidal/homicidal ideation. He was IVC'd at the time and was seen by psych who cleared him for discharge. Per chart review, he has had multiple admissions for the same with 5 admissions in the last 6 months.  On PM 1/29, he remained on 0.4 Precedex ; therefore, PCCM asked to assist with his care. He is A&O x 3, no deficits. He had some hallucinations this AM but currently is lucid and appropriately interactive in conversation. He tells me that he has a job interview tomorrow (Thursday 1/30) which he would really like to not miss. I informed him that he would not be discharged by then and recommended he call the employer and request a delay in his interview.  Pertinent  Medical History:  has Alcohol  abuse with intoxication  (HCC); Alcohol  abuse; Hepatic steatosis; Anxiety and depression; Sinus tachycardia; Class 1 obesity; Secondary polycythemia; Alcohol  withdrawal delirium (HCC); Alcohol  withdrawal (HCC); Facial fracture due to fall [facility]); Orbital floor (blow-out) closed fracture (HCC); Alcohol -induced mood disorder (HCC); C. difficile colitis; and Acute hypoxic respiratory failure (HCC) on their problem list.  Significant Hospital Events: Including procedures, antibiotic start and stop dates in addition to other pertinent events   1/28 admit 1/29 PCCM consult 1/31 - Unable to wean precedex  thus far, up to 1.8 dex this am.  Either sedated or agitated, swinging at staff this am 2/1 - 06/29/2023: Required Haldol  and even additional phenobarb.  This is despite being on Precedex  infusion.  Currently afebrile with a normal white count.  Intermittent agitation overnight.  Continued on Precedex  getting scheduled Haldol  2/3: nasogastric tube placed. Focus changed to treating NARD over DT. Added scheduled seroquel , clonidine  taper and librium  taper. Allowing pheno to taper off on current schedule initially was weaned off from the Precedex , but then got agitated had once again was rapidly titrated back up to ceiling dose 2/4 back on dex. Still agitated at times, kicked nursing staff during the evening hours of 2/3 Seroquel  increased 2/9 blood pressure dropped overnight, Precedex  was decreased rapidly and off.  Norepinephrine  was started.  Shortly thereafter he developed significant hypertension tachycardia sweating likely Precedex  withdrawal.  Blood pressure significantly elevated.  Interim History / Subjective:  Still antsy on precedex  and levophed .  Objective:  Blood pressure 97/70, pulse 75, temperature  98.3 F (36.8 C), temperature source Oral, resp. rate 19, height 5\' 10"  (1.778 m), weight 86.2 kg, SpO2 96%.        Intake/Output Summary (Last 24 hours) at 07/08/2023 0845 Last data filed at 07/08/2023 0656 Gross per 24  hour  Intake 2179.68 ml  Output 2900 ml  Net -720.32 ml   Filed Weights   07/05/23 0800 07/07/23 0500 07/08/23 0345  Weight: 89 kg 86.7 kg 86.2 kg   Examination: Wiggly in bed Aox3 Moves all ext Mildly confused RASS +1  CBC, BMP okay  Labs/imaging personally reviewed:  CT head 1/29 > neg.  Assessment & Plan:  Alcohol  withdrawal delirium now complicated by persistent agitated delirium- pretty far outside window for just Dts, think there is concurrent psychiatric issues at play. Dysphagia - Add standing phenobarb/gabapentin  to standing benzodiazepines, oxycodone , seroquel , depakene  - Trial of high dose thiamine  - Levo for MAP 65 - Wean off precedex  as able - Appreciate any additional agent psychiatry service thinks might help  Best practice (evaluated daily):  Diet/type: NPO; TF per cortrak DVT prophylaxis: LMWH Pressure ulcer(s): pressure ulcer assessment deferred > n/a GI prophylaxis: N/A Lines: N/A Foley:  N/A Code Status:  full code Last date of multidisciplinary goals of care discussion: pending, no family available  31 min cc time Ardelle Kos MD PCCM

## 2023-07-08 NOTE — Consult Note (Signed)
Jermaine [name] Psychiatry Consult Evaluation  Service Date: July 08, 2023 LOS:  LOS: 13 days    Primary Psychiatric Diagnoses  Hyperactive delirium 2.  Alcohol use disorder 3.  Depression  Assessment  Jermaine [name] is a 58 y.o. male admitted medically for 06/25/2023  5:56 PM for alcohol withdrawal. He carries the psychiatric diagnoses of alcohol use disorder, depression and anxiety. Psychiatry was consulted for management of alcohol withdrawal and agitation.  Per chart, the patient was admitted on 1/28   His BAL was 283 on admission, and was given Ativan and Versed with minimal improvement in withdrawal.  He was admitted and was started on precedex, and was difficult to wean off.  He had multiple hospitalizations in the past with similar presentations including DT.   On assessment today the patient remains delusional and appears to be responding to internal stimuli. He made a strange comment about needing to change his uniform and he is unaware of where he is. Per nursing staff the patient has not made much progress with the psychiatric medications that he has been given and they are continuing to try and wean the patient off Precedex. When his dose is decreased the patient becomes more physically aggressive with staff.   07/07/23: Patient seen in his hospital room. He is sleeping and appears heavily sedated, and did not wake up despite multiple trials. Collateral information from the treating nurse (Ms. Christell Faith) reports that patient was awake, pleasant and communicating with her earlier today. The nurse denies any overnight aggressive or irritable behavior, and reports patient has been calm on current medication regimen (olanzapine 15 mg twice daily). She states further that patient is being gradually weaned off Precedex per the primary team.   07/08/23: The client stated, "I'm doing ok".  His depression is "just common for me" at a moderate level with no suicidal ideations, denies  past suicide attempts. Anxiety is "always up there", no recent panic attacks.  His sleep was "good", no food at this time per his diet orders.  Denies hallucinations.  Recommended rehab to which he politely stated, "I've been to [facility] three times.  "I have all the tools, just need to use them. I have a sponsor and go to AA."  He was pleasant with no complaints, denied withdrawal symptoms.  One of the RN staff reported he did not have any issues today but did fall yesterday.  Diagnoses:  Active Hospital problems: Principal Problem:   Alcohol withdrawal (HCC) Active Problems:   Alcohol withdrawal delirium (HCC)   Acute hypoxic respiratory failure (HCC)   Plan   ## Psychiatric Medication Recommendations:  Alcohol withdrawal Hyperactive delirium - Delirium precautions - Continue Precedex; continue to wean as able - Continue Olanzapine 15 mg BID  - Continue Depakote 500 mg BID  - Continue Thiamine/Folic acid/multivitamins.  -- Continue Melatonin nightly -- Once his delirium improves, goal is to taper off antipsychotics and Ativan before discharge (still needs it while trying to wean from Precedex).  ## Medical Decision Making Capacity:  - None  ## Further Work-up:  -- Monitor labs regularly  ## Disposition:  -- Declined rehab, he does have a sponsor and attends AA  ## Behavioral / Environmental:  --  Delirium precautions  ## Safety and Observation Level:  - Based on my clinical evaluation, I estimate the patient to be at low risk of self harm in the current setting - At this time, we recommend a intermediate level of observation. This decision is based  on my review of the chart including patient's history and current presentation, interview of the patient, mental status examination, and consideration of suicide risk including evaluating suicidal ideation, plan, intent, suicidal or self-harm behaviors, risk factors, and protective factors. This judgment is based on our ability to  directly address suicide risk, implement suicide prevention strategies and develop a safety plan while the patient is in the clinical setting. Please contact our team if there is a concern that risk level has changed.  Suicide risk assessment  Patient has following modifiable risk factors for suicide: delirium which we are addressing by psychopharm and non-psychopharm approaches  Patient has following non-modifiable or demographic risk factors for suicide: male gender and psychiatric hospitalization  Patient has the following protective factors against suicide: no history of suicide attempts and no history of NSSIB  Thank you for this consult request. Recommendations have been communicated to the primary team.  We will continue to follow up at this time.   [name], NP  Psychiatric and Social History   Relevant Aspects of Hospital Course:   Psychiatric Specialty Exam: Physical Exam  Review of Systems  Blood pressure 97/70, pulse 75, temperature 99 F (37.2 C), temperature source Oral, resp. rate 19, height 5\' 10"  (1.778 m), weight 86.2 kg, SpO2 96%.Body mass index is 27.27 kg/m.  General Appearance: Disheveled  Eye Contact:  Good  Speech:  Normal Rate  Volume:  Normal  Mood:  Anxious, depressed  Affect:  Congruent  Thought Process:  Coherent  Orientation:  Full (Time, Place, and Person)  Thought Content:  Logical  Suicidal Thoughts:  No  Homicidal Thoughts:  No  Memory:  Immediate;   Good Recent;   Good Remote;   Good  Judgement:  Fair  Insight:  Fair  Psychomotor Activity:  Decreased  Concentration:  Concentration: Good and Attention Span: Good  Recall:  Good  Fund of Knowledge:  Good  Language:  Good  Akathisia:  No  Handed:  Right  AIMS (if indicated):     Assets:  Housing Leisure Time Resilience Social Support  ADL's:  Intact  Cognition:  WNL  Sleep:         Exam Findings   Physical Exam:  Denies physical issues Vital signs:  Temp:  [97.5 F (36.4  C)-98.3 F (36.8 C)] 98.3 F (36.8 C) (02/10 0356) Pulse Rate:  [57-92] 75 (02/10 0644) Resp:  [13-27] 19 (02/10 0644) BP: (86-142)/(54-84) 97/70 (02/10 0630) SpO2:  [89 %-99 %] 96 % (02/10 0644) Weight:  [86.2 kg] 86.2 kg (02/10 0345) Physical Exam  Blood pressure 97/70, pulse 75, temperature 98.3 F (36.8 C), temperature source Oral, resp. rate 19, height 5\' 10"  (1.778 m), weight 86.2 kg, SpO2 96%. Body mass index is 27.27 kg/m.   Other History   These have been pulled in through the EMR, reviewed, and updated if appropriate.   Family History:   The patient's family history is not on file. He was adopted.  Medical History: Past Medical History:  Diagnosis Date   Alcohol abuse    Anxiety    Class 1 obesity 09/14/2021   Depression     Surgical History: Past Surgical History:  Procedure Laterality Date   HERNIA REPAIR      Medications:   Current Facility-Administered Medications:    acetaminophen (TYLENOL) tablet 650 mg, 650 mg, Per Tube, Q6H PRN, 650 mg at 07/04/23 2125 **OR** acetaminophen (TYLENOL) suppository 650 mg, 650 mg, Rectal, Q6H PRN, Charlott Holler, MD  bisacodyl (DULCOLAX) suppository 10 mg, 10 mg, Rectal, Daily PRN, Selmer Dominion B, NP   Chlorhexidine Gluconate Cloth 2 % PADS 6 each, 6 each, Topical, Daily, Opyd, Lavone Neri, MD, 6 each at 07/07/23 1703   dexmedetomidine (PRECEDEX) 400 MCG/100ML (4 mcg/mL) infusion, 0.8 mcg/kg/hr, Intravenous, Continuous, Hunsucker, Lesia Sago, MD, Last Rate: 18.14 mL/hr at 07/08/23 0656, 0.8 mcg/kg/hr at 07/08/23 0656   sennosides (SENOKOT) 8.8 MG/5ML syrup 5 mL, 5 mL, Per Tube, QHS PRN **AND** docusate (COLACE) 50 MG/5ML liquid 50 mg, 50 mg, Per Tube, QHS PRN, Charlott Holler, MD, 50 mg at 07/04/23 2125   feeding supplement (OSMOLITE 1.5 CAL) liquid 1,000 mL, 1,000 mL, Per Tube, Continuous, Charlott Holler, MD, Last Rate: 60 mL/hr at 07/08/23 0656, Infusion Verify at 07/08/23 0656   feeding supplement (PROSource TF20)  liquid 60 mL, 60 mL, Per Tube, Daily, Simonne Martinet, NP, 60 mL at 07/07/23 1038   folic acid (FOLVITE) tablet 1 mg, 1 mg, Per Tube, Daily, Charlott Holler, MD, 1 mg at 07/07/23 1037   haloperidol lactate (HALDOL) injection 5 mg, 5 mg, Intravenous, Q8H PRN, Hunsucker, Lesia Sago, MD, 5 mg at 07/08/23 0800   hydrALAZINE (APRESOLINE) injection 10 mg, 10 mg, Intravenous, Q6H PRN, Paliwal, Aditya, MD, 10 mg at 06/30/23 2218   lactulose (CHRONULAC) 10 GM/15ML solution 10 g, 10 g, Per Tube, BID, Selmer Dominion B, NP, 10 g at 07/07/23 2347   loperamide HCl (IMODIUM) 1 MG/7.5ML suspension 2-4 mg, 2-4 mg, Per Tube, PRN, Charlott Holler, MD   LORazepam (ATIVAN) tablet 2 mg, 2 mg, Per Tube, TID, Selmer Dominion B, NP, 2 mg at 07/07/23 2349   melatonin tablet 3 mg, 3 mg, Per Tube, QHS, Stark Klein, MD, 3 mg at 07/07/23 2349   multivitamin with minerals tablet 1 tablet, 1 tablet, Per Tube, Daily, Charlott Holler, MD, 1 tablet at 07/07/23 1038   norepinephrine (LEVOPHED) 4mg  in (0.016 mg/mL) premix infusion, 2-10 mcg/min, Intravenous, Titrated, Paliwal, Aditya, MD, Last Rate: 7.5 mL/hr at 07/08/23 0656, 2 mcg/min at 07/08/23 0656   OLANZapine zydis (ZYPREXA) disintegrating tablet 15 mg, 15 mg, Per Tube, BID, Clovis Riley, Jerrell L, DO, 15 mg at 07/07/23 2347   Oral care mouth rinse, 15 mL, Mouth Rinse, PRN, Luciano Cutter, MD   oxyCODONE (Oxy IR/ROXICODONE) immediate release tablet 10 mg, 10 mg, Per Tube, Q6H, Hunsucker, Lesia Sago, MD, 10 mg at 07/08/23 0536   PHENObarbital (LUMINAL) injection 130 mg, 130 mg, Intravenous, BID, Lorin Glass, MD   polyethylene glycol (MIRALAX / GLYCOLAX) packet 17 g, 17 g, Per Tube, Daily, Simonne Martinet, NP, 17 g at 07/07/23 1039   sodium chloride flush (NS) 0.9 % injection 10 mL, 10 mL, Intravenous, PRN, Kalman Shan, MD   sodium chloride flush (NS) 0.9 % injection 3 mL, 3 mL, Intravenous, Q12H, Opyd, Lavone Neri, MD, 3 mL at 07/07/23 2350   thiamine (VITAMIN B1)  tablet 100 mg, 100 mg, Per Tube, Daily, 100 mg at 07/07/23 1037 **OR** thiamine (VITAMIN B1) injection 100 mg, 100 mg, Intravenous, Daily, Durel Salts S, MD, 100 mg at 07/06/23 1013   valproic acid (DEPAKENE) 250 MG/5ML solution 500 mg, 500 mg, Per Tube, BID, Selmer Dominion B, NP, 500 mg at 07/07/23 2349  Allergies: Allergies  Allergen Reactions   Ketamine     Hx of causing fever in 2024    Codeine Itching

## 2023-07-08 NOTE — Progress Notes (Addendum)
 Found patient with elevated HR in 140's trying to scoot out of bed with waist and bilateral wrist restraints intact. Patient still disoriented as evidenced by statements that he left something burning in the oven and needs to adjust the stove. Unable to reorient patient as this triggers belligerence and worsening behavior. Administered scheduled meds but when gave phenobarbital , found that he had removed hand mitts and rubbed IV sites on arms against bed so much that they were unable to be flushed and dressings nearly off. IV team requested due to difficult restart. Readjusted restraints and informed bedside RN.

## 2023-07-08 NOTE — TOC Progression Note (Signed)
 Transition of Care [facility]) - Progression Note    Patient Details  Name: Stephaun Borders MRN: [id number] Date of Birth: 02/12/1966  [facility] of [facility]) CM/SW Contact  Loreda Rodriguez, RN Phone Number:[phone]  07/08/2023, 3:29 PM  Clinical Narrative:    TOC following patient from home Home with Chief Complaint: Lightheaded, anxious, tremulous, confused.         Expected Discharge Plan and Services                                               Social Determinants of Health (SDOH) Interventions SDOH Screenings   Food Insecurity: Patient Unable To Answer (06/25/2023)  Housing: Patient Unable To Answer (06/25/2023)  Transportation Needs: Patient Unable To Answer (06/25/2023)  Utilities: Patient Unable To Answer (06/25/2023)  Recent Concern: Utilities - At Risk (05/02/2023)  Tobacco Use: Low Risk  (06/25/2023)    Readmission Risk Interventions    05/13/2023    2:46 PM 01/17/2023    4:03 PM 01/14/2023    1:49 PM  Readmission Risk Prevention Plan  Post Dischage Appt   Complete  Medication Screening   Complete  Transportation Screening Complete Complete Complete  PCP or Specialist Appt within 5-7 Days  Complete   PCP or Specialist Appt within 3-5 Days Complete    Home Care Screening  Complete   Medication Review (RN CM)  Complete   HRI or Home Care Consult Complete    Social Work Consult for Recovery Care Planning/Counseling Complete    Palliative Care Screening Not Applicable    Medication Review Oceanographer) Complete

## 2023-07-09 DIAGNOSIS — R451 Restlessness and agitation: Secondary | ICD-10-CM | POA: Diagnosis not present

## 2023-07-09 DIAGNOSIS — R1310 Dysphagia, unspecified: Secondary | ICD-10-CM | POA: Diagnosis not present

## 2023-07-09 DIAGNOSIS — F10931 Alcohol use, unspecified with withdrawal delirium: Secondary | ICD-10-CM | POA: Diagnosis not present

## 2023-07-09 LAB — GLUCOSE, CAPILLARY
Glucose-Capillary: 120 mg/dL — ABNORMAL HIGH (ref 70–99)
Glucose-Capillary: 120 mg/dL — ABNORMAL HIGH (ref 70–99)
Glucose-Capillary: 134 mg/dL — ABNORMAL HIGH (ref 70–99)
Glucose-Capillary: 137 mg/dL — ABNORMAL HIGH (ref 70–99)
Glucose-Capillary: 140 mg/dL — ABNORMAL HIGH (ref 70–99)
Glucose-Capillary: 145 mg/dL — ABNORMAL HIGH (ref 70–99)
Glucose-Capillary: 174 mg/dL — ABNORMAL HIGH (ref 70–99)

## 2023-07-09 MED ORDER — METOCLOPRAMIDE HCL 5 MG/ML IJ SOLN
10.0000 mg | Freq: Three times a day (TID) | INTRAMUSCULAR | Status: DC
  Administered 2023-07-09 – 2023-07-12 (×9): 10 mg via INTRAVENOUS
  Filled 2023-07-09 (×9): qty 2

## 2023-07-09 MED ORDER — METOCLOPRAMIDE HCL 5 MG/ML IJ SOLN: 10.0000 mg | Freq: Three times a day (TID) | INTRAMUSCULAR | Status: DC

## 2023-07-09 MED ORDER — OLANZAPINE 10 MG PO TBDP
20.0000 mg | ORAL_TABLET | Freq: Two times a day (BID) | ORAL | Status: DC
  Administered 2023-07-09 (×2): 20 mg
  Filled 2023-07-09 (×3): qty 4

## 2023-07-09 MED ORDER — LORAZEPAM 1 MG PO TABS: 2.0000 mg | ORAL_TABLET | Freq: Two times a day (BID) | ORAL | Status: DC

## 2023-07-09 MED ORDER — PHENOBARBITAL 32.4 MG PO TABS: 64.8000 mg | ORAL_TABLET | Freq: Two times a day (BID) | ORAL | Status: DC

## 2023-07-09 MED ORDER — PHENOBARBITAL 32.4 MG PO TABS
64.8000 mg | ORAL_TABLET | Freq: Two times a day (BID) | ORAL | Status: DC
  Administered 2023-07-09 – 2023-07-10 (×3): 64.8 mg
  Filled 2023-07-09 (×3): qty 2

## 2023-07-09 MED ORDER — LORAZEPAM 1 MG PO TABS
2.0000 mg | ORAL_TABLET | Freq: Every day | ORAL | Status: DC
Start: 2023-07-09 — End: 2023-07-10
  Administered 2023-07-09: 2 mg
  Filled 2023-07-09: qty 2

## 2023-07-09 MED ORDER — LORAZEPAM 1 MG PO TABS
2.0000 mg | ORAL_TABLET | Freq: Two times a day (BID) | ORAL | Status: DC
  Administered 2023-07-09 – 2023-07-10 (×3): 2 mg
  Filled 2023-07-09 (×3): qty 2

## 2023-07-09 NOTE — Progress Notes (Signed)
PT Cancellation Note  Patient Details Name: Jermaine [name] MRN: [id number] DOB: 11/06/1965   Cancelled Treatment:    Reason Eval/Treat Not Completed: Other (comment); hold for am per RN, will attempt again as schedule allows.    [facility] 07/09/2023, 8:52 AM

## 2023-07-09 NOTE — Progress Notes (Signed)
NAME:  Jermaine [name], MRN:  [id number], DOB:  05-03-1966, LOS: 14 ADMISSION DATE:  06/25/2023, CONSULTATION DATE:  06/26/23 REFERRING MD:  Jarvis Newcomer CHIEF COMPLAINT:  EtOH Withdrawal   BRIEF  Jermaine [name] is a 58 y.o. male who has a PMH as below including but not limited to anxiety, depression, EtOH dependence. He presented to urgent care 1/28 with confusion, tremors, anxiety, presyncopal symptoms. He was found to have tachycardia, tachypnea, hypoxia and was subsequently told to come to the ED for further evaluaiton.  In ED, he had persistent symptoms. He informed staff that his last drink was earlier that day. He normally drinks 1 - 2 pints Titos every day or every other day along with 2 - 4 White Claws. On day of presentation, he believes he only drank [employer] and no liquor.  He was found to have ethanol of 283 and despite Ativan and Versed, continued to have symptoms. He was subsequently admitted by [facility] for EtOH withdrawal and was started on Precedex and Ativan per [facility]. Head CT and UDS negative. He denies any recreational drug use. He has had withdrawal in the past requiring hospitalization. Appears just admitted to this facility 05/20/23 through 05/24/23 for the same as well as suicidal/homicidal ideation. He was IVC'd at the time and was seen by psych who cleared him for discharge. Per chart review, he has had multiple admissions for the same with 5 admissions in the last 6 months.  On PM 1/29, he remained on 0.4 Precedex; therefore, PCCM asked to assist with his care. He is A&O x 3, no deficits. He had some hallucinations this AM but currently is lucid and appropriately interactive in conversation. He tells me that he has a job interview tomorrow (Thursday 1/30) which he would really like to not miss. I informed him that he would not be discharged by then and recommended he call the employer and request a delay in his interview.  Pertinent  Medical History:  has Alcohol abuse with intoxication  (HCC); Alcohol abuse; Hepatic steatosis; Anxiety and depression; Sinus tachycardia; Class 1 obesity; Secondary polycythemia; DTs (delirium tremens) (HCC); Alcohol withdrawal (HCC); Facial fracture due to fall [facility]); Orbital floor (blow-out) closed fracture (HCC); Alcohol-induced mood disorder (HCC); C. difficile colitis; and Acute hypoxic respiratory failure (HCC) on their problem list.  Significant Hospital Events: Including procedures, antibiotic start and stop dates in addition to other pertinent events   1/28 admit 1/29 PCCM consult 1/31 - Unable to wean precedex thus far, up to 1.8 dex this am.  Either sedated or agitated, swinging at staff this am 2/1 - 06/29/2023: Required Haldol and even additional phenobarb.  This is despite being on Precedex infusion.  Currently afebrile with a normal white count.  Intermittent agitation overnight.  Continued on Precedex getting scheduled Haldol 2/3: nasogastric tube placed. Focus changed to treating NARD over DT. Added scheduled seroquel, clonidine taper and librium taper. Allowing pheno to taper off on current schedule initially was weaned off from the Precedex, but then got agitated had once again was rapidly titrated back up to ceiling dose 2/4 back on dex. Still agitated at times, kicked nursing staff during the evening hours of 2/3 Seroquel increased 2/9 blood pressure dropped overnight, Precedex was decreased rapidly and off.  Norepinephrine was started.  Shortly thereafter he developed significant hypertension tachycardia sweating likely Precedex withdrawal.  Blood pressure significantly elevated.  Interim History / Subjective:  Remains off precedex. A bit wild this am.  Objective:  Blood pressure (!) 118/91,  pulse (!) 105, temperature 98.5 F (36.9 C), temperature source Axillary, resp. rate 11, height 5\' 10"  (1.778 m), weight 81.3 kg, SpO2 92%.        Intake/Output Summary (Last 24 hours) at 07/09/2023 0739 Last data filed at 07/09/2023  0656 Gross per 24 hour  Intake 1765.95 ml  Output 2050 ml  Net -284.05 ml   Filed Weights   07/07/23 0500 07/08/23 0345 07/09/23 0500  Weight: 86.7 kg 86.2 kg 81.3 kg   Examination: Sleeping/snoring Lungs clear Ext warm Abd soft  No new labs  Labs/imaging personally reviewed:  CT head 1/29 > neg.  Assessment & Plan:  Alcohol withdrawal delirium now complicated by persistent agitated delirium- pretty far outside window for just Dts, think there is concurrent psychiatric issues at play. Dysphagia - Continue standing phenobarb, gabapentin, benzodiazepines, oxycodone, zyprexa, depakene - Trial of high dose thiamine - PT/OT eval - If sleepy later this am may need to cut back on some of the sedating meds - Stable for transfer out of ICU, remaining issues are consistent PO and PT/OT recs  Myrla Halsted MD PCCM

## 2023-07-09 NOTE — Consult Note (Signed)
Jermaine [name] Psychiatry Consult Evaluation  Service Date: July 09, 2023 LOS:  LOS: 14 days    Primary Psychiatric Diagnoses  Hyperactive delirium 2.  Alcohol use disorder 3.  Depression  Assessment  Jermaine [name] is a 58 y.o. male admitted medically for 06/25/2023  5:56 PM for alcohol withdrawal. He carries the psychiatric diagnoses of alcohol use disorder, depression and anxiety. Psychiatry was consulted for management of alcohol withdrawal and agitation.  Per chart, the patient was admitted on 1/28   His BAL was 283 on admission, and was given Ativan and Versed with minimal improvement in withdrawal.  He was admitted and was started on precedex, and was difficult to wean off.  He had multiple hospitalizations in the past with similar presentations including DT.   On assessment today the patient remains delusional and appears to be responding to internal stimuli. He made a strange comment about needing to change his uniform and he is unaware of where he is. Per nursing staff the patient has not made much progress with the psychiatric medications that he has been given and they are continuing to try and wean the patient off Precedex. When his dose is decreased the patient becomes more physically aggressive with staff.   07/07/23: Patient seen in his hospital room. He is sleeping and appears heavily sedated, and did not wake up despite multiple trials. Collateral information from the treating nurse (Ms. Christell Faith) reports that patient was awake, pleasant and communicating with her earlier today. The nurse denies any overnight aggressive or irritable behavior, and reports patient has been calm on current medication regimen (olanzapine 15 mg twice daily). She states further that patient is being gradually weaned off Precedex per the primary team.   07/08/23: The client stated, "I'm doing ok".  His depression is "just common for me" at a moderate level with no suicidal ideations, denies  past suicide attempts. Anxiety is "always up there", no recent panic attacks.  His sleep was "good", no food at this time per his diet orders.  Denies hallucinations.  Recommended rehab to which he politely stated, "I've been to [facility] three times.  "I have all the tools, just need to use them. I have a sponsor and go to AA."  He was pleasant with no complaints, denied withdrawal symptoms.  One of the RN staff reported he did not have any issues today but did fall yesterday.  07/09/23: Today, the client is drowsy on assessment with slurring of speech making it difficult to understand him.  Evidently, they had to increase his Precedex as he became more agitated.  This lead to restraints.  This provider did shorten the assessment as he requested to go home and proceeded to try and get out of bed.  He quickly calmed once the provider was out of his vision field.  Psych will continue to follow and encourage him to go to rehab.  Diagnoses:  Active Hospital problems: Principal Problem:   Alcohol withdrawal (HCC) Active Problems:   DTs (delirium tremens) (HCC)   Acute hypoxic respiratory failure (HCC)   Plan   ## Psychiatric Medication Recommendations:  Alcohol withdrawal Hyperactive delirium - Delirium precautions - Continue Precedex; continue to wean as able along with Ativan - Continue Olanzapine 15 mg BID  - Continue Depakote 500 mg BID  - Continue Thiamine/Folic acid/multivitamins.  -- Continue Melatonin nightly -- Once his delirium improves, goal is to taper off antipsychotics and Ativan before discharge (still needs it while trying to wean from  Precedex).  ## Medical Decision Making Capacity:  - None  ## Further Work-up:  -- Monitor labs regularly  ## Disposition:  -- Declined rehab, he does have a sponsor and attends AA  ## Behavioral / Environmental:  --  Delirium precautions  ## Safety and Observation Level:  - Based on my clinical evaluation, I estimate the patient to  be at low risk of self harm in the current setting - At this time, we recommend a intermediate level of observation. This decision is based on my review of the chart including patient's history and current presentation, interview of the patient, mental status examination, and consideration of suicide risk including evaluating suicidal ideation, plan, intent, suicidal or self-harm behaviors, risk factors, and protective factors. This judgment is based on our ability to directly address suicide risk, implement suicide prevention strategies and develop a safety plan while the patient is in the clinical setting. Please contact our team if there is a concern that risk level has changed.  Suicide risk assessment  Patient has following modifiable risk factors for suicide: delirium which we are addressing by psychopharm and non-psychopharm approaches  Patient has following non-modifiable or demographic risk factors for suicide: male gender and psychiatric hospitalization  Patient has the following protective factors against suicide: no history of suicide attempts and no history of NSSIB  Thank you for this consult request. Recommendations have been communicated to the primary team.  We will continue to follow up at this time.   [name], NP  Psychiatric and Social History   Relevant Aspects of Hospital Course:   Psychiatric Specialty Exam: Physical Exam  Review of Systems  Blood pressure (!) 133/90, pulse (!) 135, temperature 98.4 F (36.9 C), temperature source Axillary, resp. rate 11, height 5\' 10"  (1.778 m), weight 81.3 kg, SpO2 (!) 83%.Body mass index is 25.72 kg/m.  General Appearance: Disheveled  Eye Contact:  Good  Speech:  Normal Rate  Volume:  Normal  Mood:  Anxious  Affect:  Congruent  Thought Process:  Coherent  Orientation:  Full (Time, Place, and Person)  Thought Content:  Logical  Suicidal Thoughts:  No  Homicidal Thoughts:  No  Memory:  Immediate;   Good Recent;    Good Remote;   Good  Judgement:  Fair  Insight:  Fair  Psychomotor Activity:  Decreased  Concentration:  Concentration: Good and Attention Span: Good  Recall:  Good  Fund of Knowledge:  Good  Language:  Good  Akathisia:  No  Handed:  Right  AIMS (if indicated):     Assets:  Housing Leisure Time Resilience Social Support  ADL's:  Intact  Cognition:  WNL  Sleep:         Exam Findings   Physical Exam:  Denies physical issues Vital signs:  Temp:  [98.4 F (36.9 C)-100.5 F (38.1 C)] 98.4 F (36.9 C) (02/11 0800) Pulse Rate:  [89-135] 135 (02/11 1300) Resp:  [10-25] 11 (02/11 1600) BP: (106-161)/(66-120) 133/90 (02/11 1600) SpO2:  [83 %-97 %] 83 % (02/11 1300) Weight:  [81.3 kg] 81.3 kg (02/11 0500) Physical Exam  Blood pressure (!) 133/90, pulse (!) 135, temperature 98.4 F (36.9 C), temperature source Axillary, resp. rate 11, height 5\' 10"  (1.778 m), weight 81.3 kg, SpO2 (!) 83%. Body mass index is 25.72 kg/m.   Other History   These have been pulled in through the EMR, reviewed, and updated if appropriate.   Family History:   The patient's family history is not on file. He  was adopted.  Medical History: Past Medical History:  Diagnosis Date   Alcohol abuse    Anxiety    Class 1 obesity 09/14/2021   Depression     Surgical History: Past Surgical History:  Procedure Laterality Date   HERNIA REPAIR      Medications:   Current Facility-Administered Medications:    acetaminophen (TYLENOL) tablet 650 mg, 650 mg, Per Tube, Q6H PRN, 650 mg at 07/04/23 2125 **OR** acetaminophen (TYLENOL) suppository 650 mg, 650 mg, Rectal, Q6H PRN, Charlott Holler, MD   bisacodyl (DULCOLAX) suppository 10 mg, 10 mg, Rectal, Daily PRN, Selmer Dominion B, NP   Chlorhexidine Gluconate Cloth 2 % PADS 6 each, 6 each, Topical, Daily, Opyd, Lavone Neri, MD, 6 each at 07/09/23 1024   sennosides (SENOKOT) 8.8 MG/5ML syrup 5 mL, 5 mL, Per Tube, QHS PRN **AND** docusate (COLACE) 50 MG/5ML  liquid 50 mg, 50 mg, Per Tube, QHS PRN, Charlott Holler, MD, 50 mg at 07/04/23 2125   feeding supplement (OSMOLITE 1.5 CAL) liquid 1,000 mL, 1,000 mL, Per Tube, Continuous, Charlott Holler, MD, Last Rate: 60 mL/hr at 07/09/23 1554, Infusion Verify at 07/09/23 1554   feeding supplement (PROSource TF20) liquid 60 mL, 60 mL, Per Tube, Daily, Simonne Martinet, NP, 60 mL at 07/09/23 0901   folic acid (FOLVITE) tablet 1 mg, 1 mg, Per Tube, Daily, Charlott Holler, MD, 1 mg at 07/09/23 0900   gabapentin (NEURONTIN) 250 MG/5ML solution 200 mg, 200 mg, Per Tube, Q8H, Lorin Glass, MD, 200 mg at 07/09/23 1458   haloperidol lactate (HALDOL) injection 5 mg, 5 mg, Intravenous, Q8H PRN, Hunsucker, Lesia Sago, MD, 5 mg at 07/09/23 1022   hydrALAZINE (APRESOLINE) injection 10 mg, 10 mg, Intravenous, Q6H PRN, Paliwal, Aditya, MD, 10 mg at 06/30/23 2218   lactulose (CHRONULAC) 10 GM/15ML solution 10 g, 10 g, Per Tube, BID, Selmer Dominion B, NP, 10 g at 07/09/23 0901   loperamide HCl (IMODIUM) 1 MG/7.5ML suspension 2-4 mg, 2-4 mg, Per Tube, PRN, Charlott Holler, MD   LORazepam (ATIVAN) tablet 2 mg, 2 mg, Per Tube, QHS, Lorin Glass, MD   LORazepam (ATIVAN) tablet 2 mg, 2 mg, Per Tube, BID, Charm Rings, NP, 2 mg at 07/09/23 1457   melatonin tablet 3 mg, 3 mg, Per Tube, QHS, Stark Klein, MD, 3 mg at 07/08/23 2232   metoprolol tartrate (LOPRESSOR) tablet 25 mg, 25 mg, Per Tube, BID, Lorin Glass, MD, 25 mg at 07/09/23 0900   multivitamin with minerals tablet 1 tablet, 1 tablet, Per Tube, Daily, Charlott Holler, MD, 1 tablet at 07/09/23 0900   OLANZapine zydis (ZYPREXA) disintegrating tablet 20 mg, 20 mg, Per Tube, BID, Lorin Glass, MD, 20 mg at 07/09/23 0900   Oral care mouth rinse, 15 mL, Mouth Rinse, PRN, Luciano Cutter, MD   oxyCODONE (Oxy IR/ROXICODONE) immediate release tablet 15 mg, 15 mg, Per Tube, Q6H, Lorin Glass, MD, 15 mg at 07/09/23 1457   PHENobarbital (LUMINAL) tablet 64.8 mg, 64.8  mg, Per Tube, BID, Lorin Glass, MD, 64.8 mg at 07/09/23 0901   polyethylene glycol (MIRALAX / GLYCOLAX) packet 17 g, 17 g, Per Tube, Daily, Simonne Martinet, NP, 17 g at 07/07/23 1039   sodium chloride flush (NS) 0.9 % injection 10 mL, 10 mL, Intravenous, PRN, Marchelle Gearing, Murali, MD   sodium chloride flush (NS) 0.9 % injection 3 mL, 3 mL, Intravenous, Q12H, Opyd, Lavone Neri, MD, 3 mL at  07/09/23 0902   thiamine (VITAMIN B1) 500 mg in sodium chloride 0.9 % 50 mL IVPB, 500 mg, Intravenous, Q8H, Stopped at 07/09/23 1510 **FOLLOWED BY** [START ON 07/10/2023] thiamine (VITAMIN B1) tablet 100 mg, 100 mg, Per Tube, Daily, Lorin Glass, MD   valproic acid (DEPAKENE) 250 MG/5ML solution 500 mg, 500 mg, Per Tube, BID, Selmer Dominion B, NP, 500 mg at 07/09/23 0901  Allergies: Allergies  Allergen Reactions   Ketamine     Hx of causing fever in 2024    Codeine Itching

## 2023-07-09 NOTE — Progress Notes (Signed)
PT Cancellation Note  Patient Details Name: Jermaine [name] MRN: [id number] DOB: August 15, 1965   Cancelled Treatment:    Reason Eval/Treat Not Completed: Medical issues which prohibited therapy  Patient sleeping this AM and afternoon upon approach with nursing asking for therapy to leave patient be. PT to continue to follow and check back as schedule will allow    [facility] 07/09/2023, 2:12 PM

## 2023-07-09 NOTE — Progress Notes (Signed)
OT Cancellation Note  Patient Details Name: Jermaine [name] MRN: [id number] DOB: 11/02/1965   Cancelled Treatment:    Reason Eval/Treat Not Completed: Medical issues which prohibited therapy Patient sleeping this AM and afternoon upon approach with nursing asking for therapy to leave patient be. OT to continue to follow and check back as schedule will allow on 2/12  [facility] OTR/L, MS Acute Rehabilitation Department Office# [phone]  07/09/2023, 1:50 PM

## 2023-07-09 NOTE — Plan of Care (Signed)
  Problem: Clinical Measurements: Goal: Diagnostic test results will improve Outcome: Progressing Goal: Cardiovascular complication will be avoided Outcome: Progressing   Problem: Elimination: Goal: Will not experience complications related to bowel motility Outcome: Progressing Goal: Will not experience complications related to urinary retention Outcome: Progressing   Problem: Pain Managment: Goal: General experience of comfort will improve and/or be controlled Outcome: Progressing   Problem: Safety: Goal: Ability to remain free from injury will improve Outcome: Progressing   Problem: Skin Integrity: Goal: Risk for impaired skin integrity will decrease Outcome: Progressing

## 2023-07-09 NOTE — Plan of Care (Signed)
  Problem: Clinical Measurements: Goal: Diagnostic test results will improve Outcome: Not Progressing Goal: Cardiovascular complication will be avoided Outcome: Not Progressing   Problem: Coping: Goal: Level of anxiety will decrease Outcome: Not Progressing   Problem: Elimination: Goal: Will not experience complications related to bowel motility Outcome: Not Progressing Goal: Will not experience complications related to urinary retention Outcome: Not Progressing

## 2023-07-09 NOTE — Progress Notes (Signed)
Nutrition Follow-up  DOCUMENTATION CODES:   Not applicable  INTERVENTION:  - Continue goal TF regimen via NGT: Osmolite 1.5 at 60 ml/h (1440 ml per day) Prosource TF20 60 ml daily Provides 2240 kcal, 110 gm protein, 1097 ml free water daily   - Continue Thiamine 100 mg daily    - Given alcohol abuse, checking vitamin labs: - Thiamine, Vitamin B6, and Vitamin B12: all WNL   - Continue vitamins via CIWA   - Will monitor for diet advancement.   NUTRITION DIAGNOSIS:   Increased nutrient needs related to chronic illness as evidenced by estimated needs. *ongoing  GOAL:   Patient will meet greater than or equal to 90% of their needs *met with TF  MONITOR:   TF tolerance  REASON FOR ASSESSMENT:   Consult Enteral/tube feeding initiation and management  ASSESSMENT:   58 y.o. male who has a PMH as below including but not limited to anxiety, depression, EtOH dependence. He presented to urgent care 1/28 with confusion, tremors, anxiety, presyncopal symptoms.  1/28 - Admit 2/3 - NGT placed, Osmolite 1.5 started @ 88mL/hr  Patient mumbling to himself at time of visit, remains confused with persistent agitated delirium.  TF continues at goal and patient tolerating. Continues to be unsafe to swallow. Started on high dose thiamine yesterday. Psych following.    Admit weight: 200# Current weight: 179# I&O's: +2.9L Possible weight loss since admit, however unable to verify due to varying daily weights (patient was 190# yesterday and 179# today). Will continue to monitor.   Medications reviewed and include: 1mg  folic acid, MVI, High dose thiamine (500mg  IV Q8H x2 days), Miralax  Labs reviewed:  -  Latest Reference Range & Units 07/02/23 06:25  Vitamin B1 (Thiamine) 66.5 - 200.0 nmol/L 195.5  Vitamin B12 180 - 914 pg/mL 816  Vitamin B6 3.4 - 65.2 ug/L 11.1     Diet Order:   Diet Order             Diet NPO time specified  Diet effective now                    EDUCATION NEEDS:  Not appropriate for education at this time  Skin:  Skin Assessment: Reviewed RN Assessment  Last BM:  2/10 - type 5  Height:  Ht Readings from Last 1 Encounters:  06/25/23 5\' 10"  (1.778 m)   Weight:  Wt Readings from Last 1 Encounters:  07/09/23 81.3 kg   BMI:  Body mass index is 25.72 kg/m.  Estimated Nutritional Needs:  Kcal:  2200-2400 Protein:  100-115g Fluid:  2.2L/day    Jermaine [name] RD, LDN Contact via Secure Chat.

## 2023-07-10 DIAGNOSIS — R569 Unspecified convulsions: Secondary | ICD-10-CM

## 2023-07-10 DIAGNOSIS — F10931 Alcohol use, unspecified with withdrawal delirium: Secondary | ICD-10-CM | POA: Diagnosis not present

## 2023-07-10 LAB — GLUCOSE, CAPILLARY
Glucose-Capillary: 105 mg/dL — ABNORMAL HIGH (ref 70–99)
Glucose-Capillary: 105 mg/dL — ABNORMAL HIGH (ref 70–99)
Glucose-Capillary: 108 mg/dL — ABNORMAL HIGH (ref 70–99)
Glucose-Capillary: 108 mg/dL — ABNORMAL HIGH (ref 70–99)
Glucose-Capillary: 121 mg/dL — ABNORMAL HIGH (ref 70–99)

## 2023-07-10 MED ORDER — FOLIC ACID 1 MG PO TABS
1.0000 mg | ORAL_TABLET | Freq: Every day | ORAL | Status: DC
  Administered 2023-07-11 – 2023-07-12 (×2): 1 mg via ORAL
  Filled 2023-07-10 (×2): qty 1

## 2023-07-10 MED ORDER — ACETAMINOPHEN 325 MG PO TABS: 650.0000 mg | ORAL_TABLET | Freq: Four times a day (QID) | ORAL | Status: DC | PRN

## 2023-07-10 MED ORDER — ACETAMINOPHEN 650 MG RE SUPP
650.0000 mg | Freq: Four times a day (QID) | RECTAL | Status: DC | PRN
Start: 2023-07-10 — End: 2023-07-12

## 2023-07-10 MED ORDER — THIAMINE MONONITRATE 100 MG PO TABS
100.0000 mg | ORAL_TABLET | Freq: Every day | ORAL | Status: DC
  Administered 2023-07-11 – 2023-07-12 (×2): 100 mg via ORAL
  Filled 2023-07-10 (×2): qty 1

## 2023-07-10 MED ORDER — DIVALPROEX SODIUM 250 MG PO DR TAB
500.0000 mg | DELAYED_RELEASE_TABLET | Freq: Two times a day (BID) | ORAL | Status: DC
  Administered 2023-07-10 – 2023-07-12 (×4): 500 mg via ORAL
  Filled 2023-07-10 (×4): qty 2

## 2023-07-10 MED ORDER — OXYCODONE HCL 5 MG PO TABS
15.0000 mg | ORAL_TABLET | Freq: Four times a day (QID) | ORAL | Status: DC
  Administered 2023-07-10 – 2023-07-12 (×8): 15 mg via ORAL
  Filled 2023-07-10 (×9): qty 3

## 2023-07-10 MED ORDER — METOPROLOL TARTRATE 25 MG PO TABS
25.0000 mg | ORAL_TABLET | Freq: Two times a day (BID) | ORAL | Status: DC
  Administered 2023-07-10 – 2023-07-12 (×4): 25 mg via ORAL
  Filled 2023-07-10 (×4): qty 1

## 2023-07-10 MED ORDER — MELATONIN 3 MG PO TABS
3.0000 mg | ORAL_TABLET | Freq: Every day | ORAL | Status: DC
  Administered 2023-07-10 – 2023-07-11 (×2): 3 mg via ORAL
  Filled 2023-07-10 (×2): qty 1

## 2023-07-10 MED ORDER — POLYETHYLENE GLYCOL 3350 17 G PO PACK: 17.0000 g | PACK | Freq: Every day | ORAL | Status: DC

## 2023-07-10 MED ORDER — LORAZEPAM 1 MG PO TABS
2.0000 mg | ORAL_TABLET | Freq: Every day | ORAL | Status: DC
  Administered 2023-07-10: 2 mg via ORAL
  Filled 2023-07-10: qty 2

## 2023-07-10 MED ORDER — GABAPENTIN 100 MG PO CAPS
200.0000 mg | ORAL_CAPSULE | Freq: Three times a day (TID) | ORAL | Status: DC
  Administered 2023-07-10 – 2023-07-12 (×5): 200 mg via ORAL
  Filled 2023-07-10 (×5): qty 2

## 2023-07-10 MED ORDER — ENOXAPARIN SODIUM 40 MG/0.4ML IJ SOSY
40.0000 mg | PREFILLED_SYRINGE | INTRAMUSCULAR | Status: DC
  Administered 2023-07-10 – 2023-07-12 (×3): 40 mg via SUBCUTANEOUS
  Filled 2023-07-10 (×3): qty 0.4

## 2023-07-10 MED ORDER — LORAZEPAM 1 MG PO TABS
2.0000 mg | ORAL_TABLET | Freq: Two times a day (BID) | ORAL | Status: DC
  Administered 2023-07-10: 2 mg via ORAL
  Filled 2023-07-10: qty 2

## 2023-07-10 MED ORDER — LACTULOSE 10 GM/15ML PO SOLN
10.0000 g | Freq: Two times a day (BID) | ORAL | Status: DC
  Administered 2023-07-10 – 2023-07-12 (×3): 10 g via ORAL
  Filled 2023-07-10 (×3): qty 30

## 2023-07-10 MED ORDER — PHENOBARBITAL 32.4 MG PO TABS
64.8000 mg | ORAL_TABLET | Freq: Two times a day (BID) | ORAL | Status: DC
  Administered 2023-07-10: 64.8 mg via ORAL
  Filled 2023-07-10: qty 2

## 2023-07-10 MED ORDER — OLANZAPINE 10 MG PO TBDP
20.0000 mg | ORAL_TABLET | Freq: Two times a day (BID) | ORAL | Status: DC
  Administered 2023-07-10 – 2023-07-12 (×4): 20 mg via ORAL
  Filled 2023-07-10 (×4): qty 2

## 2023-07-10 MED ORDER — ADULT MULTIVITAMIN W/MINERALS CH
1.0000 | ORAL_TABLET | Freq: Every day | ORAL | Status: DC
  Administered 2023-07-11 – 2023-07-12 (×2): 1 via ORAL
  Filled 2023-07-10 (×2): qty 1

## 2023-07-10 NOTE — Progress Notes (Signed)
Pharmacy: [facility] for VTE pxx  Patient is a 58 y.o M with hx EtOH who presented to the ED on 06/25/23 with lightheadedness, anxiety, tremors, and confusion. During this admission, he was treated for EtOH withdrawal and had nasogastric tube placed on 07/01/23.  He was started on LMWH 40 mg daily and this was d/ced on 07/01/23 for procedure. Pharmacy has been consulted on 07/10/23 to resume LMWH for VTE pxx.   Plan: - lovenox 40mg  daily SQ q24h  - Pharmacy will sign off for consult.  Reconsult [place] if need further assistance.  Dorna Leitz, PharmD, BCPS 07/10/2023 11:00 AM

## 2023-07-10 NOTE — Consult Note (Signed)
[facility]erm[facility] [name] Psychiatry Consult Evaluation  Service Date: July 10, 2023 LOS:  LOS: 15 days    Primary Psychiatric Diagnoses  Hyperactive delirium 2.  Alcohol use disorder 3.  Depression  Assessment  Jermaine [name] is a 58 y.o. male admitted medically for 06/25/2023  5:56 PM for alcohol withdrawal. He carries the psychiatric diagnoses of alcohol use disorder, depression and anxiety. Psychiatry was consulted for management of alcohol withdrawal and agitation.  Per chart, the patient was admitted on 1/28   His BAL was 283 on admission, and was given Ativan and Versed with minimal improvement in withdrawal.  He was admitted and was started on precedex, and was difficult to wean off.  He had multiple hospitalizations in the past with similar presentations including DT.   On assessment today the patient remains delusional and appears to be responding to internal stimuli. He made a strange comment about needing to change his uniform and he is unaware of where he is. Per nursing staff the patient has not made much progress with the psychiatric medications that he has been given and they are continuing to try and wean the patient off Precedex. When his dose is decreased the patient becomes more physically aggressive with staff.   07/07/23: Patient seen in his hospital room. He is sleeping and appears heavily sedated, and did not wake up despite multiple trials. Collateral information from the treating nurse (Jermaine [name]) reports that patient was awake, pleasant and communicating with her earlier today. The nurse denies any overnight aggressive or irritable behavior, and reports patient has been calm on current medication regimen (olanzapine 15 mg twice daily). She states further that patient is being gradually weaned off Precedex per the primary team.   07/08/23: The client stated, "I'm doing ok".  His depression is "just common for me" at a moderate level with no suicidal ideations, denies  past suicide attempts. Anxiety is "always up there", no recent panic attacks.  His sleep was "good", no food at this time per his diet orders.  Denies hallucinations.  Recommended rehab to which he politely stated, "I've been to [name] three times.  "I have all the tools, just need to use them. I have a sponsor and go to AA."  He was pleasant with no complaints, denied withdrawal symptoms.  One of the RN staff reported he did not have any issues today but did fall yesterday.  07/09/23: Today, the client is drowsy on assessment with slurring of speech making it difficult to understand him.  Evidently, they had to increase his Precedex as he became more agitated.  This lead to restraints.  This provider did shorten the assessment as he requested to go home and proceeded to try and get out of bed.  He quickly calmed once the provider was out of his vision field.  Psych will continue to follow and encourage him to go to rehab.  07/10/2023: Upon assessment the client was finishing up working with occupational therapy. OT reports that he did well for his first time moving but is very unsteady on his feet. Per RN he is now off of his Precedex and is alert and oriented to person, place and time with intermittent confusion, he remains in soft wrist restraints. Toady, he rates his depression 8/10 and denies suicidal ideations. He rates his anxiety a 6/10. At this time he remains NPO with tube feeds. He reports that he slept terrible last night. He denies any cravings or symptoms of withdrawal. Denies hallucinations,  homicidal ideations and paranoia. The client is uninterested in rehab services when he is medically cleared. He went to rehab three times in the past, when asking what will be different for him this time the client states, "I am going to go to meetings everyday and do the 90/90. I will talk to my sponsor daily.". Per client, his sponsor is aware of his current hospital admission. During the assessment the  client was cooperative and pleasant.   Diagnoses:  Active Hospital problems: Principal Problem:   Alcohol withdrawal (HCC) Active Problems:   DTs (delirium tremens) (HCC)   Acute hypoxic respiratory failure (HCC)   Plan   ## Psychiatric Medication Recommendations:  Alcohol withdrawal Hyperactive delirium - Delirium precautions - Continue Olanzapine 15 mg BID  - Continue Depakote 500 mg BID  - Continue Thiamine/Folic acid/multivitamins.  -- Continue Melatonin nightly -- Once his delirium improves, goal is to taper off antipsychotics and Ativan before discharge (still needs it while trying to wean from Precedex).  ## Medical Decision Making Capacity:  - None  ## Further Work-up:  -- Monitor labs regularly  ## Disposition:  -- Declined rehab, he does have a sponsor and attends AA  ## Behavioral / Environmental:  --  Delirium precautions  ## Safety and Observation Level:  - Based on my clinical evaluation, I estimate the patient to be at low risk of self harm in the current setting - At this time, we recommend a intermediate level of observation. This decision is based on my review of the chart including patient's history and current presentation, interview of the patient, mental status examination, and consideration of suicide risk including evaluating suicidal ideation, plan, intent, suicidal or self-harm behaviors, risk factors, and protective factors. This judgment is based on our ability to directly address suicide risk, implement suicide prevention strategies and develop a safety plan while the patient is in the clinical setting. Please contact our team if there is a concern that risk level has changed.  Suicide risk assessment  Patient has following modifiable risk factors for suicide: delirium which we are addressing by psychopharm and non-psychopharm approaches  Patient has following non-modifiable or demographic risk factors for suicide: male gender and psychiatric  hospitalization  Patient has the following protective factors against suicide: no history of suicide attempts and no history of NSSIB  Thank you for this consult request. Recommendations have been communicated to the primary team.  We will continue to follow up at this time.   [name], NP  Psychiatric and Social History   Relevant Aspects of Hospital Course:   Psychiatric Specialty Exam: Physical Exam Vitals and nursing note reviewed.  Constitutional:      Appearance: Normal appearance.  HENT:     Head: Normocephalic.  Pulmonary:     Effort: Pulmonary effort is normal.  Neurological:     General: No focal deficit present.     Mental Status: He is alert and oriented to person, place, and time.     Review of Systems  Constitutional:  Positive for fatigue.  Psychiatric/Behavioral:  Positive for dysphoric mood. The patient is nervous/anxious.     Blood pressure (!) 140/69, pulse (!) 111, temperature 97.6 F (36.4 C), temperature source Oral, resp. rate 13, height 5\' 10"  (1.778 m), weight 83 kg, SpO2 91%.Body mass index is 26.26 kg/m.  General Appearance: Casual  Eye Contact:  Good  Speech:  Normal Rate  Volume:  Normal  Mood:  Anxious and depressed  Affect:  Congruent  Thought  Process:  Coherent  Orientation:  Full (Time, Place, and Person)  Thought Content:  Logical  Suicidal Thoughts:  No  Homicidal Thoughts:  No  Memory:  Immediate;   Good Recent;   Good Remote;   Good  Judgement:  Fair  Insight:  Fair  Psychomotor Activity:  Decreased  Concentration:  Concentration: Good and Attention Span: Good  Recall:  Good  Fund of Knowledge:  Good  Language:  Good  Akathisia:  No  Handed:  Right  AIMS (if indicated):     Assets:  Housing Leisure Time Resilience Social Support  ADL's:  Intact  Cognition:  WNL  Sleep:         Exam Findings   Physical Exam:  Denies physical issues Vital signs:  Temp:  [97.6 F (36.4 C)-99.3 F (37.4 C)] 97.6 F (36.4 C)  (02/12 0400) Pulse Rate:  [79-135] 111 (02/12 0900) Resp:  [10-25] 13 (02/12 0900) BP: (91-154)/(60-121) 140/69 (02/12 0900) SpO2:  [83 %-99 %] 91 % (02/12 0900) Weight:  [83 kg] 83 kg (02/12 0500) Physical Exam Vitals and nursing note reviewed.  Constitutional:      Appearance: Normal appearance.  HENT:     Head: Normocephalic.  Pulmonary:     Effort: Pulmonary effort is normal.  Neurological:     General: No focal deficit present.     Mental Status: He is alert and oriented to person, place, and time.     Blood pressure (!) 140/69, pulse (!) 111, temperature 97.6 F (36.4 C), temperature source Oral, resp. rate 13, height 5\' 10"  (1.778 m), weight 83 kg, SpO2 91%. Body mass index is 26.26 kg/m.   Other History   These have been pulled in through the EMR, reviewed, and updated if appropriate.   Family History:   The patient's family history is not on file. He was adopted.  Medical History: Past Medical History:  Diagnosis Date   Alcohol abuse    Anxiety    Class 1 obesity 09/14/2021   Depression     Surgical History: Past Surgical History:  Procedure Laterality Date   HERNIA REPAIR      Medications:   Current Facility-Administered Medications:    acetaminophen (TYLENOL) tablet 650 mg, 650 mg, Per Tube, Q6H PRN, 650 mg at 07/10/23 0419 **OR** acetaminophen (TYLENOL) suppository 650 mg, 650 mg, Rectal, Q6H PRN, Charlott Holler, MD   bisacodyl (DULCOLAX) suppository 10 mg, 10 mg, Rectal, Daily PRN, Selmer Dominion B, NP   Chlorhexidine Gluconate Cloth 2 % PADS 6 each, 6 each, Topical, Daily, Opyd, Lavone Neri, MD, 6 each at 07/09/23 1024   sennosides (SENOKOT) 8.8 MG/5ML syrup 5 mL, 5 mL, Per Tube, QHS PRN **AND** docusate (COLACE) 50 MG/5ML liquid 50 mg, 50 mg, Per Tube, QHS PRN, Charlott Holler, MD, 50 mg at 07/04/23 2125   feeding supplement (OSMOLITE 1.5 CAL) liquid 1,000 mL, 1,000 mL, Per Tube, Continuous, Charlott Holler, MD, Last Rate: 60 mL/hr at 07/10/23 0912,  Infusion Verify at 07/10/23 0912   feeding supplement (PROSource TF20) liquid 60 mL, 60 mL, Per Tube, Daily, Simonne Martinet, NP, 60 mL at 07/09/23 0901   folic acid (FOLVITE) tablet 1 mg, 1 mg, Per Tube, Daily, Charlott Holler, MD, 1 mg at 07/09/23 0900   gabapentin (NEURONTIN) 250 MG/5ML solution 200 mg, 200 mg, Per Tube, Q8H, Lorin Glass, MD, 200 mg at 07/10/23 0655   haloperidol lactate (HALDOL) injection 5 mg, 5 mg, Intravenous, Q8H PRN, Hunsucker,  Lesia Sago, MD, 5 mg at 07/10/23 0419   hydrALAZINE (APRESOLINE) injection 10 mg, 10 mg, Intravenous, Q6H PRN, Paliwal, Aditya, MD, 10 mg at 06/30/23 2218   lactulose (CHRONULAC) 10 GM/15ML solution 10 g, 10 g, Per Tube, BID, Selmer Dominion B, NP, 10 g at 07/09/23 2249   loperamide HCl (IMODIUM) 1 MG/7.5ML suspension 2-4 mg, 2-4 mg, Per Tube, PRN, Charlott Holler, MD   LORazepam (ATIVAN) tablet 2 mg, 2 mg, Per Tube, QHS, Lorin Glass, MD, 2 mg at 07/09/23 2243   LORazepam (ATIVAN) tablet 2 mg, 2 mg, Per Tube, BID, Charm Rings, NP, 2 mg at 07/09/23 1457   melatonin tablet 3 mg, 3 mg, Per Tube, QHS, Stark Klein, MD, 3 mg at 07/09/23 2243   metoCLOPramide (REGLAN) injection 10 mg, 10 mg, Intravenous, Q8H, Lorin Glass, MD, 10 mg at 07/10/23 5188   metoprolol tartrate (LOPRESSOR) tablet 25 mg, 25 mg, Per Tube, BID, Lorin Glass, MD, 25 mg at 07/09/23 2243   multivitamin with minerals tablet 1 tablet, 1 tablet, Per Tube, Daily, Charlott Holler, MD, 1 tablet at 07/09/23 0900   OLANZapine zydis (ZYPREXA) disintegrating tablet 20 mg, 20 mg, Per Tube, BID, Lorin Glass, MD, 20 mg at 07/09/23 2242   Oral care mouth rinse, 15 mL, Mouth Rinse, PRN, Luciano Cutter, MD   oxyCODONE (Oxy IR/ROXICODONE) immediate release tablet 15 mg, 15 mg, Per Tube, Q6H, Lorin Glass, MD, 15 mg at 07/10/23 0524   PHENobarbital (LUMINAL) tablet 64.8 mg, 64.8 mg, Per Tube, BID, Lorin Glass, MD, 64.8 mg at 07/09/23 2242   polyethylene glycol (MIRALAX  / GLYCOLAX) packet 17 g, 17 g, Per Tube, Daily, Simonne Martinet, NP, 17 g at 07/07/23 1039   sodium chloride flush (NS) 0.9 % injection 10 mL, 10 mL, Intravenous, PRN, Kalman Shan, MD, 10 mL at 07/09/23 2359   sodium chloride flush (NS) 0.9 % injection 3 mL, 3 mL, Intravenous, Q12H, Opyd, Lavone Neri, MD, 3 mL at 07/09/23 2200   [COMPLETED] thiamine (VITAMIN B1) 500 mg in sodium chloride 0.9 % 50 mL IVPB, 500 mg, Intravenous, Q8H, Stopped at 07/09/23 2321 **FOLLOWED BY** thiamine (VITAMIN B1) tablet 100 mg, 100 mg, Per Tube, Daily, Lorin Glass, MD   valproic acid (DEPAKENE) 250 MG/5ML solution 500 mg, 500 mg, Per Tube, BID, Selmer Dominion B, NP, 500 mg at 07/09/23 2248  Allergies: Allergies  Allergen Reactions   Ketamine     Hx of causing fever in 2024    Codeine Itching

## 2023-07-10 NOTE — Plan of Care (Signed)
  Problem: Clinical Measurements: Goal: Respiratory complications will improve Outcome: Progressing   Problem: Coping: Goal: Level of anxiety will decrease Outcome: Progressing   Problem: Elimination: Goal: Will not experience complications related to bowel motility Outcome: Progressing Goal: Will not experience complications related to urinary retention Outcome: Progressing   Problem: Skin Integrity: Goal: Risk for impaired skin integrity will decrease Outcome: Progressing

## 2023-07-10 NOTE — Evaluation (Addendum)
Physical Therapy Evaluation Patient Details Name: Jermaine [name] MRN: [id number] DOB: 1966-05-05 Today's Date: 07/10/2023  History of Present Illness  Patient is a 58 year old male who presented to urgent care on 1/28 with confusion, tremors, anxiety, and presyncopal episodes. Patient was admitted with alcohol withdrawal delirium complicated by persistent agitated delirium, and dysphagia. YNW:[id number], depression, EtOH dependence.  Clinical Impression  Pt admitted with above diagnosis.  Pt currently with functional limitations due to the deficits listed below (see PT Problem List). Pt will benefit from acute skilled PT to increase their independence and safety with mobility to allow discharge.     The patient is alert and able to participate in mobility.Patient demonstrates  poor balance in standing/ambulation. Patient required a RW and mod support for balance to ambulate x 80'.  Patient also presents with impaired memory and awareness .  Patient resides  alone and reports being independent, not working.  Patient's HR max 134, SPo2 on RA for ambulation >92%. Patient left on RA, RN notified.        If plan is discharge home, recommend the following: Two people to help with walking and/or transfers;A lot of help with bathing/dressing/bathroom;Assist for transportation;Direct supervision/assist for medications management;Assistance with cooking/housework;Supervision due to cognitive status;Help with stairs or ramp for entrance   Can travel by private vehicle   No    Equipment Recommendations None recommended by PT  Recommendations for Other Services       Functional Status Assessment Patient has had a recent decline in their functional status and demonstrates the ability to make significant improvements in function in a reasonable and predictable amount of time.     Precautions / Restrictions Precautions Precautions: Fall Precaution/Restrictions Comments: monitor O2 and  HR Restrictions Weight Bearing Restrictions Per Provider Order: No      Mobility  Bed Mobility Overal bed mobility: Needs Assistance Bed Mobility: Supine to Sit, Sit to Supine     Supine to sit: Min assist Sit to supine: Contact guard assist   General bed mobility comments: Min  HHA to pull to sitting    Transfers Overall transfer level: Needs assistance Equipment used: Rolling walker (2 wheels) Transfers: Sit to/from Stand Sit to Stand: Mod assist, +2 safety/equipment           General transfer comment: patient with  posterior bias, leans against the bed, not pushing down into Rw.    Ambulation/Gait Ambulation/Gait assistance: Mod assist, +2 safety/equipment, +2 physical assistance Gait Distance (Feet): 80 Feet Assistive device: Rolling walker (2 wheels) Gait Pattern/deviations: Step-to pattern, Step-through pattern, Staggering right, Staggering left Gait velocity: decr     General Gait Details: support to  keep weight shifted forward, drifts to each side, staggers at times  Stairs            Wheelchair Mobility     Tilt Bed    Modified Rankin (Stroke Patients Only)       Balance Overall balance assessment: Needs assistance Sitting-balance support: No upper extremity supported, Feet supported Sitting balance-Leahy Scale: Poor Sitting balance - Comments: LOB with attempt for figure four.   Standing balance support: Reliant on assistive device for balance, Bilateral upper extremity supported, During functional activity Standing balance-Leahy Scale: Poor Standing balance comment: posterior bias at stance                             Pertinent Vitals/Pain Pain Assessment Pain Assessment: Faces Pain Location: left abdomen Pain Descriptors /  Indicators: Discomfort Pain Intervention(s): Monitored during session    Home Living Family/patient expects to be discharged to:: Private residence Living Arrangements: Alone Available Help at  Discharge: Friend(s);Available PRN/intermittently Type of Home: Apartment Home Access: Level entry       Home Layout: One level Home Equipment: Rolling Walker (2 wheels) Additional Comments: patient was confused at times during session. chart indicated homeless in part and patient indicated living in apartment with no steps to enter.    Prior Function Prior Level of Function : Independent/Modified Independent;Driving             Mobility Comments: reported he used to work in [employer] but does not any more.       Extremity/Trunk Assessment   Upper Extremity Assessment Upper Extremity Assessment: Overall WFL for tasks assessed    Lower Extremity Assessment Lower Extremity Assessment: Generalized weakness (noted mild ataxia)    Cervical / Trunk Assessment Cervical / Trunk Assessment: Normal  Communication   Communication Communication: No apparent difficulties    Cognition Arousal: Alert Behavior During Therapy: Flat affect   PT - Cognitive impairments: No family/caregiver present to determine baseline, Awareness, Memory, Safety/Judgement, Orientation   Orientation impairments: Time, Situation                   PT - Cognition Comments: patient required frequent cues for safety, mildly impulsive and wanting to amb farther than PT recommended Following commands: Impaired Following commands impaired: Follows one step commands with increased time     Cueing       General Comments      Exercises     Assessment/Plan    PT Assessment Patient needs continued PT services  PT Problem List Decreased strength;Decreased balance;Decreased cognition;Decreased knowledge of precautions;Decreased mobility;Decreased knowledge of use of DME;Cardiopulmonary status limiting activity;Decreased activity tolerance;Decreased safety awareness       PT Treatment Interventions DME instruction;Therapeutic activities;Cognitive remediation;Gait training;Therapeutic  exercise;Patient/family education;Functional mobility training;Balance training    PT Goals (Current goals can be found in the Care Plan section)  Acute Rehab PT Goals Patient Stated Goal: go home, PT Goal Formulation: With patient Time For Goal Achievement: 07/24/23 Potential to Achieve Goals: Fair    Frequency Min 1X/week     Co-evaluation PT/OT/SLP Co-Evaluation/Treatment: Yes Reason for Co-Treatment: Necessary to address cognition/behavior during functional activity PT goals addressed during session: Mobility/safety with mobility OT goals addressed during session: ADL's and self-care       AM-PAC PT "6 Clicks" Mobility  Outcome Measure Help needed turning from your back to your side while in a flat bed without using bedrails?: A Little Help needed moving from lying on your back to sitting on the side of a flat bed without using bedrails?: A Little Help needed moving to and from a bed to a chair (including a wheelchair)?: A Little Help needed standing up from a chair using your arms (e.g., wheelchair or bedside chair)?: A Lot Help needed to walk in hospital room?: A Lot Help needed climbing 3-5 steps with a railing? : Total 6 Click Score: 14    End of Session Equipment Utilized During Treatment: Gait belt Activity Tolerance: Patient tolerated treatment well Patient left: in bed;with call bell/phone within reach;with bed alarm set;with restraints reapplied Nurse Communication: Mobility status PT Visit Diagnosis: Unsteadiness on feet (R26.81);Muscle weakness (generalized) (M62.81);Difficulty in walking, not elsewhere classified (R26.2);Other abnormalities of gait and mobility (R26.89)    Time: 1610-9604 PT Time Calculation (min) (ACUTE ONLY): 23 min   Charges:  PT Evaluation $PT Eval Low Complexity: 1 Low PT Treatments $PT General Charges $$ ACUTE PT VISIT: 1 Visit         Blanchard Kelch PT Acute Rehabilitation Services Office [phone] Weekend  [phone]   Rada Hay 07/10/2023, 11:45 AM

## 2023-07-10 NOTE — Plan of Care (Signed)
Problem: Nutrition: Goal: Adequate nutrition will be maintained Outcome: Progressing   Problem: Elimination: Goal: Will not experience complications related to bowel motility Outcome: Progressing

## 2023-07-10 NOTE — Progress Notes (Signed)
PROGRESS NOTE    Jermaine [name]  JXB:[id number] DOB: Oct 13, 1965 DOA: 06/25/2023 PCP: Farris Has, MD    Brief Narrative:   Jermaine [name] is a 58 y.o. male with past medical history significant for severe EtOH use disorder, depression, anxiety, mood disorder who presented to [facility] ED on 06/25/2023 from urgent care for concern of possible seizure.  Patient reported lightheadedness, anxiety, tremors and confusion.  On EMS arrival patient was postictal with his reported last alcohol drink several hours prior.  He apparently drinks a 1-2 pints Tito's vodka every day or every other day along with 2-3 white claws.  On day of presentation, he believes he only drank white claws and no liquor.  He denies any recreational drug use. He has had withdrawal in the past requiring hospitalization. Appears just admitted to this facility 05/20/23 through 05/24/23 for the same as well as suicidal/homicidal ideation. He was IVC'd at the time and was seen by psych who cleared him for discharge. Per chart review, he has had multiple admissions for the same with 5 admissions in the last 6 months.   In the ED, temperature 99.1 F, HR 121, RR 24, BP 134/82, SpO2 84% on 6 L nasal cannula.  WBC 9.7, hemoglobin 17.2, platelet count 241.  Sodium 139, potassium 3.5, chloride 98, CO2 25, glucose 90, BUN 18, Cram 1.05.  AST 50, ALT 50, total bili 0.7.  UDS negative.  COVID/RSV/influenza PCR negative.  Chest x-ray with no active cardiopulmonary disease process.  Ethanol level 283. Despite Ativan and Versed, continued to have symptoms. TRH consulted for admission for EtOH withdrawal and was started on Precedex and Ativan per [facility].   Significant Hospital events: 1/28: Admit by [facility] for EtOH withdrawal, placed on Precedex drip 1/29: PCCM consulted for assistance given difficulty weaning from Precedex 1/31: Unable to wean precedex, up to 1.8 dex this am.  Either sedated or agitated, swinging at staff this am 2/1: Required  Haldol and additional phenobarb, despite on Precedex infusion.  2/3: nasogastric tube placed. Focus changed to treating NARD over DT. Added scheduled seroquel, clonidine taper and librium taper. Initially weaned off from Precedex, but agitated again; Precedex titrated back up to ceiling dose 2/4: Remains on precedex, agitated at times, kicked nursing staff evening hours of 2/3;  Seroquel increased 2/9: blood pressure dropped overnight, Precedex was decreased rapidly and off.  Norepinephrine was started.  Shortly thereafter he developed significant hypertension tachycardia sweating likely Precedex withdrawal.  Blood pressure significantly elevated. 2/11: remains off precedex 2/12: transfer to progressive; back to [facility] service; calm, alert and oriented wanting to eat   Assessment & Plan:   Severe alcohol withdrawal delirium with agitation Patient presenting with possible seizure-like activity at urgent care in the setting of severe alcohol use disorder apparently drinking 1-2 pints of Tito vodka every day in addition to 2-3 white claws.  EtOH level elevated to 83 at time of admission.  UDS negative.  CT head without contrast with no acute intracranial findings.  Initially treated with Ativan and Versed but continued to have significant symptoms and was placed on Precedex drip.  Patient's with difficult to control symptoms even with Precedex and additional medications required including Haldol, phenobarbital, Librium, clonidine, Seroquel were added.  Precedex was eventually titrated off on 07/08/2023. -- Depakote 500 mg per tube twice daily -- Phenobarbital 64.8 mg per tube twice daily -- Ativan 2 mg per tube twice daily, 2 mg per tube nightly -- Gabapentin 200 mg per tube every 8  hours -- Haldol 5 mg IV every 8 hours as needed agitation -- Folic acid, multivitamin, thiamine  Dysphagia Etiology likely secondary to his significant withdrawal symptoms.  Nasogastric tube was placed on 07/01/2023 and was  initiated on tube feeds.  Precedex was discontinued on 2/10; and withdrawal symptoms markedly improved. -- Discussed with RN to perform bedside swallow evaluation; if concerns will have SLP evaluate officially -- Continue tube feeds, medications via NG tube until swallow evaluation is complete  Anxiety/depression/mood disorder Likely his underlying psychiatric disorders playing a significant role in his withdrawal symptoms. -- Psychiatry following, appreciate assistance -- Olanzapine 20 mg per tube twice daily  Weakness debility/deconditioning -- PT/OT evaluation   DVT prophylaxis: Lovenox    Code Status: Full Code Family Communication: No family present at bedside this morning  Disposition Plan:  Level of care: Progressive Status is: Inpatient Remains inpatient appropriate because: Needs transition from tube feeds back to oral intake, pending swallow evaluation, pending PT/OT evaluation    Consultants:  PCCM -signed off 2/12 Psychiatry  Procedures:  NG tube placement 2/3  Antimicrobials:  None   Subjective: Patient seen examined bedside, lying in bed.  Sleeping but easily arousable.  Remains with bilateral wrist restraints in place.  NG tube in place with no current tube feeds infusing.  Patient reports he is hungry and thirsty and would like to try something to eat and drink.  Discussed with RN to perform bedside swallow evaluation and if concerns will need official speech therapy evaluation.  Patient appears to be calm now off of Precedex for 2 days.  He is alert and oriented.  No family present.  No other specific questions, concerns or complaints at this time.  Denies headache, no chest pain, no shortness of breath, no abdominal pain.  No acute events overnight per nurse staff.  Objective: Vitals:   07/10/23 0600 07/10/23 0700 07/10/23 0800 07/10/23 0900  BP: 120/60 136/88 (!) 154/121 (!) 140/69  Pulse: 79 79 (!) 104 (!) 111  Resp: 11 11 (!) 24 13  Temp:       TempSrc:      SpO2: 93% 93% 92% 91%  Weight:      Height:        Intake/Output Summary (Last 24 hours) at 07/10/2023 1030 Last data filed at 07/10/2023 2956 Gross per 24 hour  Intake 1870.49 ml  Output 900 ml  Net 970.49 ml   Filed Weights   07/08/23 0345 07/09/23 0500 07/10/23 0500  Weight: 86.2 kg 81.3 kg 83 kg    Examination:  Physical Exam: GEN: NAD, alert and oriented x 3, chronically ill appearance, appears older than stated age HEENT: NCAT, PERRL, EOMI, sclera clear, dry mucous membranes PULM: CTAB w/o wheezes/crackles, normal respiratory effort, on 2 L nasal count with SpO2 99% at rest CV: Tachycardic, regular rate w/o M/G/R GI: abd soft, NTND, NABS, no R/G/M MSK: no peripheral edema, moves all EXTR independently, bilateral soft wrist restraints in place NEURO: No focal neurological deficits PSYCH: normal mood/affect Integumentary: No concerning rashes/lesions/wounds noted on exposed skin surfaces    Data Reviewed: I have personally reviewed following labs and imaging studies  CBC: Recent Labs  Lab 07/05/23 0813 07/06/23 0253  WBC 11.5* 10.1  NEUTROABS 8.6*  --   HGB 15.9 14.4  HCT 49.8 43.4  MCV 99.2 97.5  PLT 174 201   Basic Metabolic Panel: Recent Labs  Lab 07/04/23 1028 07/06/23 0253 07/08/23 0238  NA 136 136 137  K 4.0 4.8 4.8  CL 98 98 96*  CO2 28 30 32  GLUCOSE 155* 113* 105*  BUN 15 19 20   CREATININE 0.89 0.88 0.92  CALCIUM 9.2 8.9 9.1   GFR: Estimated Creatinine Clearance: 91.5 mL/min (by C-G formula based on SCr of 0.92 mg/dL). Liver Function Tests: No results for input(s): "AST", "ALT", "ALKPHOS", "BILITOT", "PROT", "ALBUMIN" in the last 168 hours. No results for input(s): "LIPASE", "AMYLASE" in the last 168 hours. No results for input(s): "AMMONIA" in the last 168 hours. Coagulation Profile: No results for input(s): "INR", "PROTIME" in the last 168 hours. Cardiac Enzymes: No results for input(s): "CKTOTAL", "CKMB",  "CKMBINDEX", "TROPONINI" in the last 168 hours. BNP (last 3 results) No results for input(s): "PROBNP" in the last 8760 hours. HbA1C: No results for input(s): "HGBA1C" in the last 72 hours. CBG: Recent Labs  Lab 07/09/23 1631 07/09/23 1949 07/09/23 2345 07/10/23 0424 07/10/23 0811  GLUCAP 174* 137* 134* 108* 105*   Lipid Profile: No results for input(s): "CHOL", "HDL", "LDLCALC", "TRIG", "CHOLHDL", "LDLDIRECT" in the last 72 hours. Thyroid Function Tests: No results for input(s): "TSH", "T4TOTAL", "FREET4", "T3FREE", "THYROIDAB" in the last 72 hours. Anemia Panel: No results for input(s): "VITAMINB12", "FOLATE", "FERRITIN", "TIBC", "IRON", "RETICCTPCT" in the last 72 hours. Sepsis Labs: No results for input(s): "PROCALCITON", "LATICACIDVEN" in the last 168 hours.  No results found for this or any previous visit (from the past 240 hours).       Radiology Studies: No results found.      Scheduled Meds:  Chlorhexidine Gluconate Cloth  6 each Topical Daily   feeding supplement (PROSource TF20)  60 mL Per Tube Daily   folic acid  1 mg Per Tube Daily   gabapentin  200 mg Per Tube Q8H   lactulose  10 g Per Tube BID   LORazepam  2 mg Per Tube QHS   LORazepam  2 mg Per Tube BID   melatonin  3 mg Per Tube QHS   metoCLOPramide (REGLAN) injection  10 mg Intravenous Q8H   metoprolol tartrate  25 mg Per Tube BID   multivitamin with minerals  1 tablet Per Tube Daily   OLANZapine zydis  20 mg Per Tube BID   oxyCODONE  15 mg Per Tube Q6H   phenobarbital  64.8 mg Per Tube BID   polyethylene glycol  17 g Per Tube Daily   sodium chloride flush  3 mL Intravenous Q12H   thiamine  100 mg Per Tube Daily   valproic acid  500 mg Per Tube BID   Continuous Infusions:  feeding supplement (OSMOLITE 1.5 CAL) 60 mL/hr at 07/10/23 0912     LOS: 15 days    Time spent: 56 minutes spent on chart review, discussion with nursing staff, consultants, updating family and interview/physical  exam; more than 50% of that time was spent in counseling and/or coordination of care.    Alvira Philips [place], DO [facility] Available via Epic secure chat 7am-7pm After these hours, please refer to coverage provider listed on [email or web address] 07/10/2023, 10:30 AM

## 2023-07-10 NOTE — Evaluation (Signed)
Occupational Therapy Evaluation Patient Details Name: Jermaine [name] MRN: [id number] DOB: 29-Jan-1966 Today's Date: 07/10/2023   History of Present Illness   History of Present Illness: Patient is a 58 year old male who presented to urgent care on 1/28 with confusion, tremors, anxiety, and presyncopal episodes. Patient was admitted with alcohol withdrawal delirium complicated by persistent agitated delirium, and dysphagia. WGN:FAOZHYQ, depression, EtOH dependence.     Clinical Impressions Patient is a 58 year old male who presented for above. Patient was living at home independently per patient report. Currently, patient is +2 for functional mobility with HR up to 134 bpm in hallway. Patient has poor insight to deficits and level of assist needed to engage in ADLs safely at this time. Patient was noted to have decreased functional activity tolerance, decreased endurance, decreased standing balance, decreased safety awareness, and decreased knowledge of AD/AE impacting participation in ADLs. Patient would continue to benefit from skilled OT services at this time while admitted and after d/c to address noted deficits in order to improve overall safety and independence in ADLs. Patient will benefit from continued inpatient follow up therapy, <3 hours/day.      If plan is discharge home, recommend the following:   Two people to help with walking and/or transfers;A lot of help with bathing/dressing/bathroom;Assistance with cooking/housework;Direct supervision/assist for medications management;Assist for transportation;Help with stairs or ramp for entrance;Direct supervision/assist for financial management     Functional Status Assessment   Patient has had a recent decline in their functional status and demonstrates the ability to make significant improvements in function in a reasonable and predictable amount of time.     Equipment Recommendations   None recommended by OT       Precautions/Restrictions   Precautions Precautions: Fall Precaution/Restrictions Comments: monitor O2 Restrictions Weight Bearing Restrictions Per Provider Order: No     Mobility Bed Mobility Overal bed mobility: Needs Assistance Bed Mobility: Supine to Sit, Sit to Supine     Supine to sit: Contact guard Sit to supine: Contact guard assist   General bed mobility comments: cues to slow down.           Balance Overall balance assessment: Needs assistance Sitting-balance support: No upper extremity supported, Feet supported Sitting balance-Leahy Scale: Poor Sitting balance - Comments: LOB with attempt for figure four.   Standing balance support: Reliant on assistive device for balance, Bilateral upper extremity supported Standing balance-Leahy Scale: Poor         ADL either performed or assessed with clinical judgement   ADL Overall ADL's : Needs assistance/impaired Eating/Feeding: NPO Eating/Feeding Details (indicate cue type and reason): asking for coffee nurse made aware. Grooming: Sitting;Supervision/safety;Set up   Upper Body Bathing: Sitting;Minimal assistance   Lower Body Bathing: Sitting/lateral leans;Maximal assistance Lower Body Bathing Details (indicate cue type and reason): pain with simualted figure four positioning of RLE Upper Body Dressing : Minimal assistance;Sitting   Lower Body Dressing: Sitting/lateral leans;Moderate assistance Lower Body Dressing Details (indicate cue type and reason): to don socks with figure four positioning. Toilet Transfer: Moderate assistance;+2 for safety/equipment;+2 for physical assistance;Ambulation;Rolling walker (2 wheels) Toilet Transfer Details (indicate cue type and reason): unsteady wtih movemetns with physical A needed to maintain balance. patient with poor insight to deficits. Toileting- Clothing Manipulation and Hygiene: Sit to/from stand;Maximal assistance               Vision   Vision  Assessment?: No apparent visual deficits            Pertinent  Vitals/Pain Pain Assessment Pain Assessment: No/denies pain     Extremity/Trunk Assessment Upper Extremity Assessment Upper Extremity Assessment: Overall WFL for tasks assessed   Lower Extremity Assessment Lower Extremity Assessment: Defer to PT evaluation   Cervical / Trunk Assessment Cervical / Trunk Assessment: Normal   Communication Communication Communication: No apparent difficulties   Cognition Arousal: Alert Behavior During Therapy: Flat affect Cognition: Cognition impaired   Orientation impairments: Person, Place Awareness: Intellectual awareness impaired, Online awareness impaired Memory impairment (select all impairments): Short-term memory, Working memory Attention impairment (select first level of impairment): Sustained attention       Following commands: Impaired Following commands impaired: Follows one step commands with increased time                Home Living Family/patient expects to be discharged to:: Private residence Living Arrangements: Alone     Home Access: Level entry     Home Layout: One level               Home Equipment: Agricultural consultant (2 wheels)   Additional Comments: patient was confused at times during session. chart indicated homeless in part and patient indicated living in apartment with no steps to enter.      Prior Functioning/Environment Prior Level of Function : Independent/Modified Independent;Driving             Mobility Comments: reported he used to work in [employer] but does not any more.      OT Problem List: Decreased cognition;Decreased knowledge of precautions;Cardiopulmonary status limiting activity;Decreased safety awareness;Decreased knowledge of use of DME or AE;Decreased activity tolerance;Impaired balance (sitting and/or standing)   OT Treatment/Interventions: Self-care/ADL training;DME and/or AE instruction;Balance  training;Therapeutic activities;Patient/family education;Energy conservation      OT Goals(Current goals can be found in the care plan section)   Acute Rehab OT Goals Patient Stated Goal: to get out of hosptial OT Goal Formulation: Patient unable to participate in goal setting Time For Goal Achievement: 07/24/23 Potential to Achieve Goals: Fair   OT Frequency:  Min 1X/week    Co-evaluation PT/OT/SLP Co-Evaluation/Treatment: Yes Reason for Co-Treatment: Necessary to address cognition/behavior during functional activity          AM-PAC OT "6 Clicks" Daily Activity     Outcome Measure Help from another person eating meals?: Total (npo) Help from another person taking care of personal grooming?: A Little Help from another person toileting, which includes using toliet, bedpan, or urinal?: A Lot Help from another person bathing (including washing, rinsing, drying)?: A Lot Help from another person to put on and taking off regular upper body clothing?: A Lot Help from another person to put on and taking off regular lower body clothing?: A Lot 6 Click Score: 12   End of Session Equipment Utilized During Treatment: Gait belt;Rolling walker (2 wheels) Nurse Communication: Other (comment) (HR during session and request for drink)  Activity Tolerance: Patient tolerated treatment well Patient left: in bed;with call bell/phone within reach;with bed alarm set;with restraints reapplied  OT Visit Diagnosis: Unsteadiness on feet (R26.81);Other abnormalities of gait and mobility (R26.89)                Time: 7846-9629 OT Time Calculation (min): 12 min Charges:  OT General Charges $OT Visit: 1 Visit OT Evaluation $OT Eval Moderate Complexity: 1 Mod  Verble Styron OTR/L, MS Acute Rehabilitation Department Office# [phone]   Selinda Flavin 07/10/2023, 10:40 AM

## 2023-07-11 DIAGNOSIS — F10931 Alcohol use, unspecified with withdrawal delirium: Secondary | ICD-10-CM | POA: Diagnosis not present

## 2023-07-11 DIAGNOSIS — R569 Unspecified convulsions: Secondary | ICD-10-CM | POA: Diagnosis not present

## 2023-07-11 DIAGNOSIS — R5381 Other malaise: Secondary | ICD-10-CM | POA: Diagnosis not present

## 2023-07-11 LAB — COMPREHENSIVE METABOLIC PANEL
ALT: 58 U/L — ABNORMAL HIGH (ref 0–44)
AST: 46 U/L — ABNORMAL HIGH (ref 15–41)
Albumin: 3.5 g/dL (ref 3.5–5.0)
Alkaline Phosphatase: 43 U/L (ref 38–126)
Anion gap: 10 (ref 5–15)
BUN: 26 mg/dL — ABNORMAL HIGH (ref 6–20)
CO2: 33 mmol/L — ABNORMAL HIGH (ref 22–32)
Calcium: 9 mg/dL (ref 8.9–10.3)
Chloride: 94 mmol/L — ABNORMAL LOW (ref 98–111)
Creatinine, Ser: 1.2 mg/dL (ref 0.61–1.24)
GFR, Estimated: >60 mL/min (ref >60)
Glucose, Bld: 99 mg/dL (ref 70–99)
Potassium: 4.1 mmol/L (ref 3.5–5.1)
Sodium: 137 mmol/L (ref 135–145)
Total Bilirubin: 0.8 mg/dL (ref 0.0–1.2)
Total Protein: 6.8 g/dL (ref 6.5–8.1)

## 2023-07-11 LAB — GLUCOSE, CAPILLARY
Glucose-Capillary: 98 mg/dL (ref 70–99)
Glucose-Capillary: 99 mg/dL (ref 70–99)
Glucose-Capillary: 130 mg/dL — ABNORMAL HIGH (ref 70–99)
Glucose-Capillary: 99 mg/dL (ref 70–99)

## 2023-07-11 LAB — CBC
HCT: 45.9 % (ref 39.0–52.0)
Hemoglobin: 14.7 g/dL (ref 13.0–17.0)
MCH: 32 pg (ref 26.0–34.0)
MCHC: 32 g/dL (ref 30.0–36.0)
MCV: 100 fL (ref 80.0–100.0)
Platelets: 320 10*3/uL (ref 150–400)
RBC: 4.59 MIL/uL (ref 4.22–5.81)
RDW: 12.2 % (ref 11.5–15.5)
WBC: 9.6 10*3/uL (ref 4.0–10.5)
nRBC: 0 % (ref 0.0–0.2)

## 2023-07-11 LAB — PHOSPHORUS: Phosphorus: 3.3 mg/dL (ref 2.5–4.6)

## 2023-07-11 LAB — MAGNESIUM: Magnesium: 2.2 mg/dL (ref 1.7–2.4)

## 2023-07-11 MED ORDER — PHENOBARBITAL 32.4 MG PO TABS
32.4000 mg | ORAL_TABLET | Freq: Two times a day (BID) | ORAL | Status: DC
  Administered 2023-07-12: 32.4 mg via ORAL
  Filled 2023-07-11: qty 1

## 2023-07-11 MED ORDER — LORAZEPAM 1 MG PO TABS
1.0000 mg | ORAL_TABLET | Freq: Two times a day (BID) | ORAL | Status: DC
  Administered 2023-07-11 – 2023-07-12 (×3): 1 mg via ORAL
  Filled 2023-07-11 (×3): qty 1

## 2023-07-11 MED ORDER — PHENOBARBITAL 32.4 MG PO TABS
64.8000 mg | ORAL_TABLET | Freq: Two times a day (BID) | ORAL | Status: AC
  Administered 2023-07-11 (×2): 64.8 mg via ORAL
  Filled 2023-07-11 (×2): qty 2

## 2023-07-11 MED ORDER — LORAZEPAM 1 MG PO TABS
1.0000 mg | ORAL_TABLET | Freq: Every day | ORAL | Status: DC
  Administered 2023-07-11: 1 mg via ORAL
  Filled 2023-07-11: qty 1

## 2023-07-11 MED ORDER — ENSURE ENLIVE PO LIQD
237.0000 mL | Freq: Two times a day (BID) | ORAL | Status: DC
  Administered 2023-07-12 (×2): 237 mL via ORAL

## 2023-07-11 NOTE — Progress Notes (Signed)
Patient agitated this morning. He is oriented to self and place, disoriented to time and some aspects of situation. 1:1 sitter initiated after two falls overnight. Patient stating he "knows how to fall" and will not injure himself, poor safety awareness and unsteady with gait. Discussed goals to remove mitts and lap belt if patient cooperates with POC and safety requirements.

## 2023-07-11 NOTE — Consult Note (Signed)
[facility] Psychiatric Consult Follow Up   Jermaine [name] Psychiatry Consult Evaluation  Service Date: July 11, 2023 LOS:  LOS: 16 days    Primary Psychiatric Diagnoses  Hyperactive delirium 2.  Alcohol use disorder 3.  Depression  Assessment  Jermaine [name] is a 58 y.o. male admitted medically for 06/25/2023  5:56 PM for alcohol withdrawal. He carries the psychiatric diagnoses of alcohol use disorder, depression and anxiety. Psychiatry was consulted for management of alcohol withdrawal and agitation.  Per chart, the patient was admitted on 1/28   His BAL was 283 on admission, and was given Ativan and Versed with minimal improvement in withdrawal.  He was admitted and was started on precedex, and was difficult to wean off.  He had multiple hospitalizations in the past with similar presentations including DT.   On assessment today the patient remains delusional and appears to be responding to internal stimuli. He made a strange comment about needing to change his uniform and he is unaware of where he is. Per nursing staff the patient has not made much progress with the psychiatric medications that he has been given and they are continuing to try and wean the patient off Precedex. When his dose is decreased the patient becomes more physically aggressive with staff.   07/07/23: Patient seen in his hospital room. He is sleeping and appears heavily sedated, and did not wake up despite multiple trials. Collateral information from the treating nurse (Ms. Christell Faith) reports that patient was awake, pleasant and communicating with her earlier today. The nurse denies any overnight aggressive or irritable behavior, and reports patient has been calm on current medication regimen (olanzapine 15 mg twice daily). She states further that patient is being gradually weaned off Precedex per the primary team.   07/08/23: The client stated, "I'm doing ok".  His depression is "just common for me" at a moderate  level with no suicidal ideations, denies past suicide attempts. Anxiety is "always up there", no recent panic attacks.  His sleep was "good", no food at this time per his diet orders.  Denies hallucinations.  Recommended rehab to which he politely stated, "I've been to [facility] times.  "I have all the tools, just need to use them. I have a sponsor and go to AA."  He was pleasant with no complaints, denied withdrawal symptoms.  One of the RN staff reported he did not have any issues today but did fall yesterday.  07/09/23: Today, the client is drowsy on assessment with slurring of speech making it difficult to understand him.  Evidently, they had to increase his Precedex as he became more agitated.  This lead to restraints.  This provider did shorten the assessment as he requested to go home and proceeded to try and get out of bed.  He quickly calmed once the provider was out of his vision field.  Psych will continue to follow and encourage him to go to rehab.  07/10/2023: Upon assessment the client was finishing up working with occupational therapy. OT reports that he did well for his first time moving but is very unsteady on his feet. Per RN he is now off of his Precedex and is alert and oriented to person, place and time with intermittent confusion, he remains in soft wrist restraints. Toady, he rates his depression 8/10 and denies suicidal ideations. He rates his anxiety a 6/10. At this time he remains NPO with tube feeds. He reports that he slept terrible last night. He denies any  cravings or symptoms of withdrawal. Denies hallucinations, homicidal ideations and paranoia. The client is uninterested in rehab services when he is medically cleared. He went to rehab three times in the past, when asking what will be different for him this time the client states, "I . Per client, his sponsor is aware of his current hospital admission. During the assessment the client was cooperative and pleasant.    07/11/2023: Today, the client was resting in his bed and finishing his breakfast. The sitter is at his bedside; the client is no longer in restraints. "I am doing good, I am off of the meds and I am in a different room which means I am closer to getting out of here." He reported that he slept "pretty good". His appetite is "good" and he completed 100% of his breakfast this morning. He rated his anxiety and depression at a 6/10 and denied suicidal ideations along with hallucinations and paranoia. He reports no withdrawal symptoms or cravings. The client remains uninterested in rehab services when he is medically cleared. His plan after discharge remains unchanged from yesterday: "I am going to go to meetings everyday and do the 90/90. I will talk to my sponsor daily." During the assessment the client was cooperative and pleasant.  Diagnoses:  Active Hospital problems: Principal Problem:   Alcohol withdrawal (HCC) Active Problems:   DTs (delirium tremens) (HCC)   Acute hypoxic respiratory failure (HCC)   Plan   ## Psychiatric Medication Recommendations:  Alcohol withdrawal Hyperactive delirium - Delirium precautions - Continue Olanzapine 15 mg BID will begin to taper tomorrow if he does well with the Ativan decrease - Continue Depakote 500 mg BID  - Continue Thiamine/Folic acid/multivitamins.  -- Continue Melatonin nightly -- Ativan 2 mg TID decreased to 1 mg TID  ## Medical Decision Making Capacity:  - None  ## Further Work-up:  -- Monitor labs regularly  ## Disposition:  -- Declined rehab, he does have a sponsor and attends AA  ## Behavioral / Environmental:  --  Delirium precautions  ## Safety and Observation Level:  - Based on my clinical evaluation, I estimate the patient to be at low risk of self harm in the current setting - At this time, we recommend a intermediate level of observation. This decision is based on my review of the chart including patient's history and  current presentation, interview of the patient, mental status examination, and consideration of suicide risk including evaluating suicidal ideation, plan, intent, suicidal or self-harm behaviors, risk factors, and protective factors. This judgment is based on our ability to directly address suicide risk, implement suicide prevention strategies and develop a safety plan while the patient is in the clinical setting. Please contact our team if there is a concern that risk level has changed.  Suicide risk assessment  Patient has following modifiable risk factors for suicide: delirium which we are addressing by psychopharm and non-psychopharm approaches  Patient has following non-modifiable or demographic risk factors for suicide: male gender and psychiatric hospitalization  Patient has the following protective factors against suicide: no history of suicide attempts and no history of NSSIB  Thank you for this consult request. Recommendations have been communicated to the primary team.  We will continue to follow up at this time.   [name], NP  Psychiatric and Social History   Relevant Aspects of Hospital Course:   Psychiatric Specialty Exam: Physical Exam Vitals and nursing note reviewed.  Constitutional:      Appearance: Normal appearance.  HENT:  Head: Normocephalic.  Pulmonary:     Effort: Pulmonary effort is normal.  Neurological:     General: No focal deficit present.     Mental Status: He is alert and oriented to person, place, and time.     Review of Systems  Constitutional:  Positive for fatigue.  Psychiatric/Behavioral:  Positive for dysphoric mood. The patient is nervous/anxious.     Blood pressure 93/81, pulse 92, temperature 97.7 F (36.5 C), temperature source Oral, resp. rate 18, height 5\' 10"  (1.778 m), weight 85.1 kg, SpO2 94%.Body mass index is 26.92 kg/m.  General Appearance: Casual  Eye Contact:  Good  Speech:  Normal Rate  Volume:  Normal  Mood:   Anxious and depressed  Affect:  Congruent  Thought Process:  Coherent  Orientation:  Full (Time, Place, and Person)  Thought Content:  Logical  Suicidal Thoughts:  No  Homicidal Thoughts:  No  Memory:  Immediate;   Good Recent;   Good Remote;   Good  Judgement:  Fair  Insight:  Fair  Psychomotor Activity:  Decreased  Concentration:  Concentration: Good and Attention Span: Good  Recall:  Good  Fund of Knowledge:  Good  Language:  Good  Akathisia:  No  Handed:  Right  AIMS (if indicated):     Assets:  Housing Leisure Time Resilience Social Support  ADL's:  Intact  Cognition:  WNL  Sleep:         Exam Findings   Physical Exam:  Denies physical issues Vital signs:  Temp:  [97.7 F (36.5 C)-99.7 F (37.6 C)] 97.7 F (36.5 C) (02/13 0447) Pulse Rate:  [89-115] 92 (02/13 0600) Resp:  [10-20] 18 (02/13 0447) BP: (93-138)/(69-112) 93/81 (02/13 0600) SpO2:  [6 %-96 %] 94 % (02/13 0447) Weight:  [85.1 kg] 85.1 kg (02/13 0500) Physical Exam Vitals and nursing note reviewed.  Constitutional:      Appearance: Normal appearance.  HENT:     Head: Normocephalic.  Pulmonary:     Effort: Pulmonary effort is normal.  Neurological:     General: No focal deficit present.     Mental Status: He is alert and oriented to person, place, and time.     Blood pressure 93/81, pulse 92, temperature 97.7 F (36.5 C), temperature source Oral, resp. rate 18, height 5\' 10"  (1.778 m), weight 85.1 kg, SpO2 94%. Body mass index is 26.92 kg/m.   Other History   These have been pulled in through the EMR, reviewed, and updated if appropriate.   Family History:   The patient's family history is not on file. He was adopted.  Medical History: Past Medical History:  Diagnosis Date   Alcohol abuse    Anxiety    Class 1 obesity 09/14/2021   Depression     Surgical History: Past Surgical History:  Procedure Laterality Date   HERNIA REPAIR      Medications:   Current  Facility-Administered Medications:    acetaminophen (TYLENOL) tablet 650 mg, 650 mg, Oral, Q6H PRN **OR** acetaminophen (TYLENOL) suppository 650 mg, 650 mg, Rectal, Q6H PRN, [place], Eric J, DO   bisacodyl (DULCOLAX) suppository 10 mg, 10 mg, Rectal, Daily PRN, Selmer Dominion B, NP   Chlorhexidine Gluconate Cloth 2 % PADS 6 each, 6 each, Topical, Daily, Opyd, Lavone Neri, MD, 6 each at 07/09/23 1024   divalproex (DEPAKOTE) DR tablet 500 mg, 500 mg, Oral, Q12H, [place], Alvira Philips, DO, 500 mg at 07/10/23 2154   enoxaparin (LOVENOX) injection 40 mg, 40 mg,  Subcutaneous, Q24H, Pham, Anh P, RPH, 40 mg at 07/10/23 1329   folic acid (FOLVITE) tablet 1 mg, 1 mg, Oral, Daily, [place], Eric J, DO   gabapentin (NEURONTIN) capsule 200 mg, 200 mg, Oral, TID, [place], Alvira Philips, DO, 200 mg at 07/10/23 2154   haloperidol lactate (HALDOL) injection 5 mg, 5 mg, Intravenous, Q8H PRN, Hunsucker, Lesia Sago, MD, 5 mg at 07/11/23 0059   hydrALAZINE (APRESOLINE) injection 10 mg, 10 mg, Intravenous, Q6H PRN, Paliwal, Aditya, MD, 10 mg at 06/30/23 2218   lactulose (CHRONULAC) 10 GM/15ML solution 10 g, 10 g, Oral, BID, [place], Alvira Philips, DO, 10 g at 07/10/23 2158   LORazepam (ATIVAN) tablet 1 mg, 1 mg, Oral, BID, [place], Eric J, DO   LORazepam (ATIVAN) tablet 1 mg, 1 mg, Oral, QHS, [place], Eric J, DO   melatonin tablet 3 mg, 3 mg, Oral, QHS, [place], Eric J, DO, 3 mg at 07/10/23 2154   metoCLOPramide (REGLAN) injection 10 mg, 10 mg, Intravenous, Q8H, Lorin Glass, MD, 10 mg at 07/11/23 0551   metoprolol tartrate (LOPRESSOR) tablet 25 mg, 25 mg, Oral, BID, [place], Alvira Philips, DO, 25 mg at 07/10/23 2154   multivitamin with minerals tablet 1 tablet, 1 tablet, Oral, Daily, [place], Eric J, DO   OLANZapine zydis (ZYPREXA) disintegrating tablet 20 mg, 20 mg, Oral, BID, [place], Alvira Philips, DO, 20 mg at 07/10/23 2154   Oral care mouth rinse, 15 mL, Mouth Rinse, PRN, Luciano Cutter, MD   oxyCODONE (Oxy IR/ROXICODONE) immediate release  tablet 15 mg, 15 mg, Oral, Q6H, [place], Alvira Philips, DO, 15 mg at 07/11/23 1610   PHENobarbital (LUMINAL) tablet 64.8 mg, 64.8 mg, Oral, BID **FOLLOWED BY** [START ON 07/12/2023] PHENobarbital (LUMINAL) tablet 32.4 mg, 32.4 mg, Oral, BID, [place], Eric J, DO   polyethylene glycol (MIRALAX / GLYCOLAX) packet 17 g, 17 g, Oral, Daily, [place], Eric J, DO   sodium chloride flush (NS) 0.9 % injection 10 mL, 10 mL, Intravenous, PRN, Kalman Shan, MD, 10 mL at 07/09/23 2359   sodium chloride flush (NS) 0.9 % injection 3 mL, 3 mL, Intravenous, Q12H, Opyd, Timothy S, MD, 3 mL at 07/10/23 2200   [COMPLETED] thiamine (VITAMIN B1) 500 mg in sodium chloride 0.9 % 50 mL IVPB, 500 mg, Intravenous, Q8H, Stopped at 07/09/23 2321 **FOLLOWED BY** thiamine (VITAMIN B1) tablet 100 mg, 100 mg, Oral, Daily, [place], Alvira Philips, DO  Allergies: Allergies  Allergen Reactions   Ketamine     Hx of causing fever in 2024    Codeine Itching

## 2023-07-11 NOTE — Progress Notes (Signed)
Patient had a witnessed fall at 0040 today. The bed alarm sounded and patient was found at already up at the bedside. This nurse tried to explain the patient that it is not safe for him to be out of the bed but patient kept on walking towards the bathroom. This nurse tried to keep the patient safe while he is walking then patient got imbalance in the bathroom hitting first his bottom on the floor. Summer Ranson NT came in to help the patient get back to the bed using Stedy and clean him up because patient got a stools too. Patient said that he is not hurting anywhere and this nurse did not see any visible bleeding or injury. Liana Crocker NP and Yong Channel RN Charge nurse was informed of this event.

## 2023-07-11 NOTE — Progress Notes (Signed)
Patient's sister, Andre Lefort was already informed about the fall incident thru phone call.

## 2023-07-11 NOTE — Progress Notes (Signed)
PROGRESS NOTE    Jermaine [name]  KVQ:[id number] DOB: Feb 06, 1966 DOA: 06/25/2023 PCP: Farris Has, MD    Brief Narrative:   Jermaine [name] is a 58 y.o. male with past medical history significant for severe EtOH use disorder, depression, anxiety, mood disorder who presented to [facility] ED on 06/25/2023 from urgent care for concern of possible seizure.  Patient reported lightheadedness, anxiety, tremors and confusion.  On EMS arrival patient was postictal with his reported last alcohol drink several hours prior.  He apparently drinks a 1-2 pints Tito's vodka every day or every other day along with 2-3 white claws.  On day of presentation, he believes he only drank white claws and no liquor.  He denies any recreational drug use. He has had withdrawal in the past requiring hospitalization. Appears just admitted to this facility 05/20/23 through 05/24/23 for the same as well as suicidal/homicidal ideation. He was IVC'd at the time and was seen by psych who cleared him for discharge. Per chart review, he has had multiple admissions for the same with 5 admissions in the last 6 months.   In the ED, temperature 99.1 F, HR 121, RR 24, BP 134/82, SpO2 84% on 6 L nasal cannula.  WBC 9.7, hemoglobin 17.2, platelet count 241.  Sodium 139, potassium 3.5, chloride 98, CO2 25, glucose 90, BUN 18, Cram 1.05.  AST 50, ALT 50, total bili 0.7.  UDS negative.  COVID/RSV/influenza PCR negative.  Chest x-ray with no active cardiopulmonary disease process.  Ethanol level 283. Despite Ativan and Versed, continued to have symptoms. TRH consulted for admission for EtOH withdrawal and was started on Precedex and Ativan per [facility].   Significant Hospital events: 1/28: Admit by [facility] for EtOH withdrawal, placed on Precedex drip 1/29: PCCM consulted for assistance given difficulty weaning from Precedex 1/31: Unable to wean precedex, up to 1.8 dex this am.  Either sedated or agitated, swinging at staff this am 2/1: Required  Haldol and additional phenobarb, despite on Precedex infusion.  2/3: nasogastric tube placed. Focus changed to treating NARD over DT. Added scheduled seroquel, clonidine taper and librium taper. Initially weaned off from Precedex, but agitated again; Precedex titrated back up to ceiling dose 2/4: Remains on precedex, agitated at times, kicked nursing staff evening hours of 2/3;  Seroquel increased 2/9: blood pressure dropped overnight, Precedex was decreased rapidly and off.  Norepinephrine was started.  Shortly thereafter he developed significant hypertension tachycardia sweating likely Precedex withdrawal.  Blood pressure significantly elevated. 2/11: remains off precedex 2/12: transfer to progressive; back to [facility] service; calm, alert and oriented wanting to eat 2/13: Multiple falls x 2 overnight, tolerating advance diet, sitter at bedside; PT/OT recommend SNF placement, TOC consulted   Assessment & Plan:   Severe alcohol withdrawal delirium with agitation Patient presenting with possible seizure-like activity at urgent care in the setting of severe alcohol use disorder apparently drinking 1-2 pints of Tito vodka every day in addition to 2-3 white claws.  EtOH level elevated to 83 at time of admission.  UDS negative.  CT head without contrast with no acute intracranial findings.  Initially treated with Ativan and Versed but continued to have significant symptoms and was placed on Precedex drip.  Patient's with difficult to control symptoms even with Precedex and additional medications required including Haldol, phenobarbital, Librium, clonidine, Seroquel were added.  Precedex was eventually titrated off on 07/08/2023. -- Phenobarbital taper -- Ativan 1 mg per tube twice daily, 1 mg per tube nightly; plan to continue  taper off -- Gabapentin 200 mg per tube every 8 hours -- Haldol 5 mg IV every 8 hours as needed agitation -- Folic acid, multivitamin, thiamine  Dysphagia Etiology likely secondary  to his significant withdrawal symptoms.  Nasogastric tube was placed on 07/01/2023 and was initiated on tube feeds.  Precedex was discontinued on 2/10; and withdrawal symptoms markedly improved. -- Discussed with RN to perform bedside swallow evaluation; if concerns will have SLP evaluate officially -- Continue tube feeds, medications via NG tube until swallow evaluation is complete  Anxiety/depression/mood disorder Likely his underlying psychiatric disorders playing a significant role in his withdrawal symptoms. -- Psychiatry following, appreciate assistance -- Olanzapine 20 mg PO twice daily -- Depakote 500 mg per tube twice daily  Weakness debility/deconditioning -- PT/OT recommend SNF placement -- Fall precautions -- Safety sitter at bedside   DVT prophylaxis: enoxaparin (LOVENOX) injection 40 mg Start: 07/10/23 1200Lovenox    Code Status: Full Code Family Communication: No family present at bedside this morning  Disposition Plan:  Level of care: Progressive Status is: Inpatient Remains inpatient appropriate because: Needs transition from tube feeds back to oral intake, pending swallow evaluation, pending PT/OT evaluation    Consultants:  PCCM -signed off 2/12 Psychiatry  Procedures:  NG tube placement 2/3  Antimicrobials:  None   Subjective: Patient seen examined bedside, lying in bed.  Sitter present at bedside due to multiple falls overnight as he was trying to get out of bed.  Remains severely deconditioned/weak from prolonged 2-week hospitalization for severe EtOH withdrawal with agitation/delirium.  About the breakfast.  He is alert and oriented this morning.  Discussed with him extensively that he needs assistance when trying to get out of bed or mobilize given his weakness.  Therapy recommended SNF placement, TOC consulted.  Will start Ativan and phenobarbital taper today.  No other specific questions, concerns or complaints at this time.  Denies headache, no chest  pain, no shortness of breath, no abdominal pain.  No other acute events overnight per nursing staff.  Objective: Vitals:   07/11/23 0500 07/11/23 0600 07/11/23 1000 07/11/23 1153  BP:  93/81 101/84 112/84  Pulse:  92 (!) 109 88  Resp:      Temp:   99.1 F (37.3 C) 98.7 F (37.1 C)  TempSrc:   Oral Oral  SpO2:    90%  Weight: 85.1 kg     Height:        Intake/Output Summary (Last 24 hours) at 07/11/2023 1214 Last data filed at 07/11/2023 1000 Gross per 24 hour  Intake 494 ml  Output --  Net 494 ml   Filed Weights   07/09/23 0500 07/10/23 0500 07/11/23 0500  Weight: 81.3 kg 83 kg 85.1 kg    Examination:  Physical Exam: GEN: NAD, alert and oriented x 3, chronically ill appearance, appears older than stated age HEENT: NCAT, PERRL, EOMI, sclera clear, dry mucous membranes PULM: CTAB w/o wheezes/crackles, normal respiratory effort, on room air CV: RRR w/o M/G/R GI: abd soft, NTND, NABS, no R/G/M MSK: no peripheral edema, moves all extremities independently, Posey belt in place NEURO: No focal neurological deficits PSYCH: normal mood/affect Integumentary: No concerning rashes/lesions/wounds noted on exposed skin surfaces    Data Reviewed: I have personally reviewed following labs and imaging studies  CBC: Recent Labs  Lab 07/05/23 0813 07/06/23 0253 07/11/23 0422  WBC 11.5* 10.1 9.6  NEUTROABS 8.6*  --   --   HGB 15.9 14.4 14.7  HCT 49.8 43.4 45.9  MCV 99.2 97.5 100.0  PLT 174 201 320   Basic Metabolic Panel: Recent Labs  Lab 07/06/23 0253 07/08/23 0238 07/11/23 0422  NA 136 137 137  K 4.8 4.8 4.1  CL 98 96* 94*  CO2 30 32 33*  GLUCOSE 113* 105* 99  BUN 19 20 26*  CREATININE 0.88 0.92 1.20  CALCIUM 8.9 9.1 9.0  MG  --   --  2.2  PHOS  --   --  3.3   GFR: Estimated Creatinine Clearance: 70.1 mL/min (by C-G formula based on SCr of 1.2 mg/dL). Liver Function Tests: Recent Labs  Lab 07/11/23 0422  AST 46*  ALT 58*  ALKPHOS 43  BILITOT 0.8  PROT  6.8  ALBUMIN 3.5   No results for input(s): "LIPASE", "AMYLASE" in the last 168 hours. No results for input(s): "AMMONIA" in the last 168 hours. Coagulation Profile: No results for input(s): "INR", "PROTIME" in the last 168 hours. Cardiac Enzymes: No results for input(s): "CKTOTAL", "CKMB", "CKMBINDEX", "TROPONINI" in the last 168 hours. BNP (last 3 results) No results for input(s): "PROBNP" in the last 8760 hours. HbA1C: No results for input(s): "HGBA1C" in the last 72 hours. CBG: Recent Labs  Lab 07/10/23 2227 07/10/23 2359 07/11/23 0442 07/11/23 0751 07/11/23 1154  GLUCAP 121* 105* 98 99 130*   Lipid Profile: No results for input(s): "CHOL", "HDL", "LDLCALC", "TRIG", "CHOLHDL", "LDLDIRECT" in the last 72 hours. Thyroid Function Tests: No results for input(s): "TSH", "T4TOTAL", "FREET4", "T3FREE", "THYROIDAB" in the last 72 hours. Anemia Panel: No results for input(s): "VITAMINB12", "FOLATE", "FERRITIN", "TIBC", "IRON", "RETICCTPCT" in the last 72 hours. Sepsis Labs: No results for input(s): "PROCALCITON", "LATICACIDVEN" in the last 168 hours.  No results found for this or any previous visit (from the past 240 hours).       Radiology Studies: No results found.      Scheduled Meds:  Chlorhexidine Gluconate Cloth  6 each Topical Daily   divalproex  500 mg Oral Q12H   enoxaparin (LOVENOX) injection  40 mg Subcutaneous Q24H   folic acid  1 mg Oral Daily   gabapentin  200 mg Oral TID   lactulose  10 g Oral BID   LORazepam  1 mg Oral BID   LORazepam  1 mg Oral QHS   melatonin  3 mg Oral QHS   metoCLOPramide (REGLAN) injection  10 mg Intravenous Q8H   metoprolol tartrate  25 mg Oral BID   multivitamin with minerals  1 tablet Oral Daily   OLANZapine zydis  20 mg Oral BID   oxyCODONE  15 mg Oral Q6H   phenobarbital  64.8 mg Oral BID   Followed by   Melene Muller ON 07/12/2023] phenobarbital  32.4 mg Oral BID   polyethylene glycol  17 g Oral Daily   sodium chloride  flush  3 mL Intravenous Q12H   thiamine  100 mg Oral Daily   Continuous Infusions:     LOS: 16 days    Time spent: 56 minutes spent on chart review, discussion with nursing staff, consultants, updating family and interview/physical exam; more than 50% of that time was spent in counseling and/or coordination of care.    Alvira Philips [place], DO [facility] Available via Epic secure chat 7am-7pm After these hours, please refer to coverage provider listed on [email or web address] 07/11/2023, 12:14 PM

## 2023-07-11 NOTE — Progress Notes (Signed)
    Patient Name: Jermaine [name]           DOB: 1965/09/05  MRN: [id number]      Admission Date: 06/25/2023  Attending Provider: [place], Eric J, DO  Primary Diagnosis: Alcohol withdrawal [facility])   Level of care: Progressive    CROSS COVER NOTE   Date of Service   07/11/2023   Jermaine [name], 58 y.o. male, was admitted on 06/25/2023 for Alcohol withdrawal (HCC).    HPI/Events of Note   Unwitnessed fall x2 Fall occurred while patient was attempting to get out of bed without assistance. Currently on waist belt restraint, yet still able to escape. Adding telesitter.  Patient is currently A/O to self an place. No acute neuro deficits noted, follows commands, but is still insistent in getting out of bed.  Patient denies injury or pain. There are no visible sign of injury or deformities.  Skin is intact. ROM at baseline. Patient was able to get up with assistance.   Interventions/ Plan   No further investigative work-up necessary at this time.  Nursing staff to continue monitoring for change in acute symptoms, mobility, and pain.        Jermaine Harada, DNP, ACNPC- AG [facility]

## 2023-07-11 NOTE — Progress Notes (Signed)
Physical Therapy Treatment Patient Details Name: Jermaine [name] MRN: [id number] DOB: [birth year] Today's Date: 07/11/2023   History of Present Illness Patient is a 58 year old male who presented to urgent care on 1/28 with confusion, tremors, anxiety, and presyncopal episodes. Patient was admitted with alcohol withdrawal delirium complicated by persistent agitated delirium, and dysphagia. WGN:FAOZHYQ, depression, EtOH dependence.    PT Comments  PT - Cognition Comments: patient required frequent cues for safety, mildly impulsive.  Does NOT recall falling twice last night. Pt lives home alone currently "between jobs"  Admits to drink "straight Vodka" then follow with "whatever is in the house".  Pt has been to "recovery" several times.  Currently he is in bed with a Recruitment consultant.  Assisted OOB to amb was some what difficult.  General Gait Details: tolerated an increased distance but VERY unsteady gait.  Ataxia.  LEFT lean.  Poor self coorection to midline and delayed balance reaction response.  HIGH FALL RISK.    If plan is discharge home, recommend the following: Two people to help with walking and/or transfers;A lot of help with bathing/dressing/bathroom;Assist for transportation;Direct supervision/assist for medications management;Assistance with cooking/housework;Supervision due to cognitive status;Help with stairs or ramp for entrance   Can travel by private vehicle     No  Equipment Recommendations  None recommended by PT    Recommendations for Other Services       Precautions / Restrictions Precautions Precautions: Fall Restrictions Weight Bearing Restrictions Per Provider Order: No     Mobility  Bed Mobility Overal bed mobility: Needs Assistance Bed Mobility: Supine to Sit, Sit to Supine     Supine to sit: Supervision Sit to supine: Supervision   General bed mobility comments: pt self able with increased caution with back to bed (impulsive)    Transfers Overall  transfer level: Needs assistance Equipment used: Rolling walker (2 wheels) Transfers: Sit to/from Stand Sit to Stand: Min assist, Mod assist           General transfer comment: patient with  posterior bias.  not pushing down into Rw.  Unsteady.    Ambulation/Gait Ambulation/Gait assistance: Min assist, Mod assist, Max assist Gait Distance (Feet): 185 Feet Assistive device: Rolling walker (2 wheels) Gait Pattern/deviations: Step-to pattern, Step-through pattern, Staggering right, Staggering left Gait velocity: decr     General Gait Details: tolerated an increased distance but VERY unsteady gait.  Ataxia.  LEFT lean.  Poor self coorection to midline and delayed balance reaction response.  HIGH FALL RISK.   Stairs             Wheelchair Mobility     Tilt Bed    Modified Rankin (Stroke Patients Only)       Balance                                            Communication    Cognition Arousal: Alert Behavior During Therapy: Flat affect       Orientation impairments: Time, Situation                   PT - Cognition Comments: patient required frequent cues for safety, mildly impulsive.  Does NOT recall falling twice last night. Following commands: Impaired      Cueing    Exercises      General Comments        Pertinent Vitals/Pain Pain  Assessment Pain Assessment: No/denies pain    Home Living                          Prior Function            PT Goals (current goals can now be found in the care plan section) Progress towards PT goals: Progressing toward goals    Frequency    Min 1X/week      PT Plan      Co-evaluation              AM-PAC PT "6 Clicks" Mobility   Outcome Measure  Help needed turning from your back to your side while in a flat bed without using bedrails?: A Little Help needed moving from lying on your back to sitting on the side of a flat bed without using bedrails?: A  Little Help needed moving to and from a bed to a chair (including a wheelchair)?: A Little Help needed standing up from a chair using your arms (e.g., wheelchair or bedside chair)?: A Little Help needed to walk in hospital room?: A Little Help needed climbing 3-5 steps with a railing? : A Little 6 Click Score: 18    End of Session Equipment Utilized During Treatment: Gait belt Activity Tolerance: Patient tolerated treatment well Patient left: in bed;with call bell/phone within reach;with bed alarm set;with nursing/sitter in room Nurse Communication: Mobility status PT Visit Diagnosis: Unsteadiness on feet (R26.81);Muscle weakness (generalized) (M62.81);Difficulty in walking, not elsewhere classified (R26.2);Other abnormalities of gait and mobility (R26.89)     Time: 6295-2841 PT Time Calculation (min) (ACUTE ONLY): 28 min  Charges:    $Gait Training: 8-22 mins $Therapeutic Activity: 8-22 mins                       Felecia Shelling  PTA Acute  Rehabilitation Services Office M-F          [phone]

## 2023-07-11 NOTE — Plan of Care (Signed)

## 2023-07-11 NOTE — Progress Notes (Addendum)
Patient had an unwitnessed fall at 2335. The bed alarm was activated and this nurse saw that the patient was already on the floor. Patient did not say his reason of getting out of the bed. Summer Ranson NT came in to help the patient get back to the bed. Once in the bed, vitals signs were taken and charted. Patient said that he is not hurting anywhere and this nurse did not see any visible bleeding or injury. Liana Crocker NP and Yong Channel RN Charge nurse was informed of this event.

## 2023-07-12 DIAGNOSIS — F05 Delirium due to known physiological condition: Secondary | ICD-10-CM | POA: Diagnosis not present

## 2023-07-12 DIAGNOSIS — F10931 Alcohol use, unspecified with withdrawal delirium: Secondary | ICD-10-CM | POA: Diagnosis not present

## 2023-07-12 LAB — COMPREHENSIVE METABOLIC PANEL
ALT: 48 U/L — ABNORMAL HIGH (ref 0–44)
AST: 37 U/L (ref 15–41)
Albumin: 3.7 g/dL (ref 3.5–5.0)
Alkaline Phosphatase: 46 U/L (ref 38–126)
Anion gap: 11 (ref 5–15)
BUN: 26 mg/dL — ABNORMAL HIGH (ref 6–20)
CO2: 34 mmol/L — ABNORMAL HIGH (ref 22–32)
Calcium: 8.9 mg/dL (ref 8.9–10.3)
Chloride: 94 mmol/L — ABNORMAL LOW (ref 98–111)
Creatinine, Ser: 0.85 mg/dL (ref 0.61–1.24)
GFR, Estimated: >60 mL/min (ref >60)
Glucose, Bld: 91 mg/dL (ref 70–99)
Potassium: 3.9 mmol/L (ref 3.5–5.1)
Sodium: 139 mmol/L (ref 135–145)
Total Bilirubin: 0.6 mg/dL (ref 0.0–1.2)
Total Protein: 6.7 g/dL (ref 6.5–8.1)

## 2023-07-12 LAB — GLUCOSE, CAPILLARY: Glucose-Capillary: 154 mg/dL — ABNORMAL HIGH (ref 70–99)

## 2023-07-12 MED ORDER — LORAZEPAM 0.5 MG PO TABS: 0.5000 mg | ORAL_TABLET | Freq: Every day | ORAL | Status: DC

## 2023-07-12 MED ORDER — LORAZEPAM 0.5 MG PO TABS
0.5000 mg | ORAL_TABLET | Freq: Two times a day (BID) | ORAL | Status: DC
  Administered 2023-07-12: 0.5 mg via ORAL
  Filled 2023-07-12: qty 1

## 2023-07-12 MED ORDER — OLANZAPINE 10 MG PO TBDP
10.0000 mg | ORAL_TABLET | Freq: Two times a day (BID) | ORAL | Status: DC
Start: 2023-07-12 — End: 2023-07-12
  Filled 2023-07-12: qty 1

## 2023-07-12 NOTE — Discharge Summary (Signed)
Physician Discharge Summary   Patient: Jermaine [name] MRN: [id number] DOB: Mar 17, 1966  Admit date:     06/25/2023  Discharge date: 07/12/23  Discharge Physician: Emeline General   PCP: Farris Has, MD   Recommendations at discharge:   Follow-up with PCP in 3 days Discharge Diagnoses: Principal Problem:   Alcohol withdrawal (HCC) Active Problems:   DTs (delirium tremens) (HCC)   Acute hypoxic respiratory failure [facility])   Hospital Course:  Patient is a 58 year old male patient with past medical history of alcohol abuse heavy drinker, presented with delirium treatments and severe alcohol withdrawal.  And patient was admitted to ICU and started on Precedex drip with as needed sedation medications.  Including Haldol and phenobarbital.  And patient was also n.p.o. and NG tube inserted for feeding.  His conditions have been fluctuating and requiring additional sedation medication including clonidine taper and Librium taper.  After Precedex drip tapered off patient however developed aggressive behaviors and frequent falls, requiring frequent as needed haloperidol and eventually stabilized and downgraded to PCU.  Patient however requested leaving AMA despite knowing that abrupt stop CIWA protocol likely to result in life-threatening conditions such as DT, seizure and life-threatening events.  Patient adamant and left the hospital AMA.     Pain control - [employer] Controlled Substance Reporting System database was reviewed. and patient was instructed, not to drive, operate heavy machinery, perform activities at heights, swimming or participation in water activities or provide baby-sitting services while on Pain, Sleep and Anxiety Medications; until their outpatient Physician has advised to do so again. Also recommended to not to take more than prescribed Pain, Sleep and Anxiety Medications.  Consultants: PCCM Procedures performed: NGT Disposition: Home Diet recommendation:  Regular  diet DISCHARGE MEDICATION: Allergies as of 07/12/2023       Reactions   Ketamine    Hx of causing fever in 2024    Codeine Itching        Medication List     STOP taking these medications    acamprosate 333 MG tablet Commonly known as: CAMPRAL   escitalopram 10 MG tablet Commonly known as: LEXAPRO       TAKE these medications    folic acid 1 MG tablet Commonly known as: FOLVITE Take 1 tablet (1 mg total) by mouth daily.   hydrOXYzine 25 MG tablet Commonly known as: ATARAX Take 50 mg by mouth every 8 (eight) hours as needed for anxiety.   mirtazapine 30 MG tablet Commonly known as: REMERON Take 30 mg by mouth at bedtime. What changed: Another medication with the same name was removed. Continue taking this medication, and follow the directions you see here.   thiamine 100 MG tablet Commonly known as: Vitamin B-1 Take 1 tablet (100 mg total) by mouth daily.        Discharge Exam: Filed Weights   07/10/23 0500 07/11/23 0500 07/12/23 0500  Weight: 83 kg 85.1 kg 88.7 kg   Eyes: PERRL, lids and conjunctivae normal ENMT: Mucous membranes are moist. Posterior pharynx clear of any exudate or lesions.Normal dentition.  Neck: normal, supple, no masses, no thyromegaly Respiratory: clear to auscultation bilaterally, no wheezing, no crackles. Normal respiratory effort. No accessory muscle use.  Cardiovascular: Regular rate and rhythm, no murmurs / rubs / gallops. No extremity edema. 2+ pedal pulses. No carotid bruits.  Abdomen: no tenderness, no masses palpated. No hepatosplenomegaly. Bowel sounds positive.  Musculoskeletal: no clubbing / cyanosis. No joint deformity upper and lower extremities. Good ROM, no contractures. Normal  muscle tone.  Skin: no rashes, lesions, ulcers. No induration Neurologic: CN 2-12 grossly intact. Sensation intact, DTR normal.  Muscle strength 5/5 on both sides Psychiatric: Normal judgment and insight. Alert and oriented x 3. Normal mood.     Condition at discharge: fair  The results of significant diagnostics from this hospitalization (including imaging, microbiology, ancillary and laboratory) are listed below for reference.   Imaging Studies: DG Abd 1 View Result Date: 07/01/2023 CLINICAL DATA:  Nasogastric tube placement EXAM: ABDOMEN - 1 VIEW COMPARISON:  None Available. FINDINGS: Feeding tube tip in the mid to distal stomach. No nasogastric tube seen. Paucity of intestinal gas. Unremarkable bones. IMPRESSION: 1. Feeding tube tip in the mid to distal stomach. 2. No nasogastric tube seen. Electronically Signed   By: Beckie Salts M.D.   On: 07/01/2023 14:28   DG Chest Port 1 View Result Date: 07/01/2023 CLINICAL DATA:  Hypoxia. EXAM: PORTABLE CHEST 1 VIEW COMPARISON:  06/26/2023 FINDINGS: Improved inspiration. Stable borderline cardiomegaly and tortuous aorta. Interval minimal right basilar linear atelectasis. Otherwise, clear lungs with normal vascularity. Feeding tube tip in the mid to distal stomach. The included bowel-gas pattern is unremarkable. Unremarkable bones IMPRESSION: 1. Improved inspiration with interval minimal right basilar linear atelectasis. 2. Stable borderline cardiomegaly. 3. Feeding tube tip in the mid to distal stomach. Electronically Signed   By: Beckie Salts M.D.   On: 07/01/2023 14:26   DG CHEST PORT 1 VIEW Result Date: 06/26/2023 CLINICAL DATA:  Acute respiratory failure with hypoxemia. EXAM: PORTABLE CHEST 1 VIEW COMPARISON:  Radiograph yesterday FINDINGS: Lung volumes are low. Stable heart size and mediastinal contours. No focal airspace disease, large pleural effusion or pneumothorax. Mild chronic elevation of right hemidiaphragm. IMPRESSION: Low lung volumes without acute findings. Electronically Signed   By: Narda Rutherford M.D.   On: 06/26/2023 10:39   CT Head Wo Contrast Result Date: 06/26/2023 CLINICAL DATA:  Seizure, new onset. EXAM: CT HEAD WITHOUT CONTRAST TECHNIQUE: Contiguous axial images were  obtained from the base of the skull through the vertex without intravenous contrast. RADIATION DOSE REDUCTION: This exam was performed according to the departmental dose-optimization program which includes automated exposure control, adjustment of the mA and/or kV according to patient size and/or use of iterative reconstruction technique. COMPARISON:  05/01/2023 FINDINGS: Brain: No evidence of acute infarction, hemorrhage, hydrocephalus, extra-axial collection or mass lesion/mass effect. Vascular: No hyperdense vessel or unexpected calcification. Skull: Normal. Negative for fracture or focal lesion. Sinuses/Orbits: No acute finding. Soft tissue thickening over the covered right nose and cheek which has diminished from 05/17/2023 maxillofacial CT. IMPRESSION: No acute finding. Electronically Signed   By: Tiburcio Pea M.D.   On: 06/26/2023 06:43   DG Chest Portable 1 View Result Date: 06/25/2023 CLINICAL DATA:  Shortness of breath.  Possible seizure EXAM: PORTABLE CHEST 1 VIEW COMPARISON:  05/03/2023 FINDINGS: The heart size and mediastinal contours are within normal limits. Both lungs are clear. The visualized skeletal structures are unremarkable. IMPRESSION: No active disease. Electronically Signed   By: Minerva Fester M.D.   On: 06/25/2023 18:42    Microbiology: Results for orders placed or performed during the hospital encounter of 06/25/23  Resp panel by RT-PCR (RSV, Flu A&B, Covid) Anterior Nasal Swab     Status: None   Collection Time: 06/25/23  6:40 PM   Specimen: Anterior Nasal Swab  Result Value Ref Range Status   SARS Coronavirus 2 by RT PCR NEGATIVE NEGATIVE Final    Comment: (NOTE) SARS-CoV-2 target nucleic acids  are NOT DETECTED.  The SARS-CoV-2 RNA is generally detectable in upper respiratory specimens during the acute phase of infection. The lowest concentration of SARS-CoV-2 viral copies this assay can detect is 138 copies/mL. A negative result does not preclude  SARS-Cov-2 infection and should not be used as the sole basis for treatment or other patient management decisions. A negative result may occur with  improper specimen collection/handling, submission of specimen other than nasopharyngeal swab, presence of viral mutation(s) within the areas targeted by this assay, and inadequate number of viral copies(<138 copies/mL). A negative result must be combined with clinical observations, patient history, and epidemiological information. The expected result is Negative.  Fact Sheet for Patients:  BloggerCourse.com  Fact Sheet for Healthcare Providers:  SeriousBroker.it  This test is no t yet approved or cleared by the [place] FDA and  has been authorized for detection and/or diagnosis of SARS-CoV-2 by FDA under an Emergency Use Authorization (EUA). This EUA will remain  in effect (meaning this test can be used) for the duration of the COVID-19 declaration under Section 564(b)(1) of the Act, 21 U.S.C.section 360bbb-3(b)(1), unless the authorization is terminated  or revoked sooner.       Influenza A by PCR NEGATIVE NEGATIVE Final   Influenza B by PCR NEGATIVE NEGATIVE Final    Comment: (NOTE) The Xpert Xpress SARS-CoV-2/FLU/RSV plus assay is intended as an aid in the diagnosis of influenza from Nasopharyngeal swab specimens and should not be used as a sole basis for treatment. Nasal washings and aspirates are unacceptable for Xpert Xpress SARS-CoV-2/FLU/RSV testing.  Fact Sheet for Patients: BloggerCourse.com  Fact Sheet for Healthcare Providers: SeriousBroker.it  This test is not yet approved or cleared by the [place] FDA and has been authorized for detection and/or diagnosis of SARS-CoV-2 by FDA under an Emergency Use Authorization (EUA). This EUA will remain in effect (meaning this test can be used) for the duration of  the COVID-19 declaration under Section 564(b)(1) of the Act, 21 U.S.C. section 360bbb-3(b)(1), unless the authorization is terminated or revoked.     Resp Syncytial Virus by PCR NEGATIVE NEGATIVE Final    Comment: (NOTE) Fact Sheet for Patients: BloggerCourse.com  Fact Sheet for Healthcare Providers: SeriousBroker.it  This test is not yet approved or cleared by the [place] FDA and has been authorized for detection and/or diagnosis of SARS-CoV-2 by FDA under an Emergency Use Authorization (EUA). This EUA will remain in effect (meaning this test can be used) for the duration of the COVID-19 declaration under Section 564(b)(1) of the Act, 21 U.S.C. section 360bbb-3(b)(1), unless the authorization is terminated or revoked.  Performed at [facility], [address] [place], [place] [address]   MRSA Next Gen by PCR, Nasal     Status: None   Collection Time: 06/25/23 10:31 PM   Specimen: Nasal Mucosa; Nasal Swab  Result Value Ref Range Status   MRSA by PCR Next Gen NOT DETECTED NOT DETECTED Final    Comment: (NOTE) The GeneXpert MRSA Assay (FDA approved for NASAL specimens only), is one component of a comprehensive MRSA colonization surveillance program. It is not intended to diagnose MRSA infection nor to guide or monitor treatment for MRSA infections. Test performance is not FDA approved in patients less than 58 years old. Performed at [facility], [address] [place], [place] [address]     Labs: CBC: Recent Labs  Lab 07/06/23 0253 07/11/23 0422  WBC 10.1 9.6  HGB 14.4 14.7  HCT  43.4 45.9  MCV 97.5 100.0  PLT 201 320   Basic Metabolic Panel: Recent Labs  Lab 07/06/23 0253 07/08/23 0238 07/11/23 0422 07/12/23 0417  NA 136 137 137 139  K 4.8 4.8 4.1 3.9  CL 98 96* 94* 94*  CO2 30 32 33* 34*  GLUCOSE 113* 105* 99 91  BUN 19 20 26* 26*  CREATININE 0.88 0.92 1.20  0.85  CALCIUM 8.9 9.1 9.0 8.9  MG  --   --  2.2  --   PHOS  --   --  3.3  --    Liver Function Tests: Recent Labs  Lab 07/11/23 0422 07/12/23 0417  AST 46* 37  ALT 58* 48*  ALKPHOS 43 46  BILITOT 0.8 0.6  PROT 6.8 6.7  ALBUMIN 3.5 3.7   CBG: Recent Labs  Lab 07/11/23 0442 07/11/23 0751 07/11/23 1154 07/11/23 1734 07/12/23 0741  GLUCAP 98 99 130* 99 154*    Discharge time spent: less than 30 minutes.  Signed: Emeline General, MD [facility] 07/12/2023

## 2023-07-12 NOTE — Progress Notes (Addendum)
Patient requested to leave AMA. Provider notified and spoke with patient at bedside. Patient signed paperwork and was escorted to hospital entrance via wheelchair. Consulting civil engineer notified.

## 2023-07-12 NOTE — Consult Note (Signed)
[facility] Psychiatric Consult Follow Up   Jermaine [name] Psychiatry Consult Evaluation  Service Date: July 12, 2023 LOS:  LOS: 17 days    Primary Psychiatric Diagnoses  Hyperactive delirium 2.  Alcohol use disorder 3.  Depression  Assessment  Jermaine [name] is a 58 y.o. male admitted medically for 06/25/2023  5:56 PM for alcohol withdrawal. He carries the psychiatric diagnoses of alcohol use disorder, depression and anxiety. Psychiatry was consulted for management of alcohol withdrawal and agitation.  Per chart, the patient was admitted on 1/28   His BAL was 283 on admission, and was given Ativan and Versed with minimal improvement in withdrawal.  He was admitted and was started on precedex, and was difficult to wean off.  He had multiple hospitalizations in the past with similar presentations including DT.   On assessment today the patient remains delusional and appears to be responding to internal stimuli. He made a strange comment about needing to change his uniform and he is unaware of where he is. Per nursing staff the patient has not made much progress with the psychiatric medications that he has been given and they are continuing to try and wean the patient off Precedex. When his dose is decreased the patient becomes more physically aggressive with staff.   07/07/23: Patient seen in his hospital room. He is sleeping and appears heavily sedated, and did not wake up despite multiple trials. Collateral information from the treating nurse (Ms. Christell Faith) reports that patient was awake, pleasant and communicating with her earlier today. The nurse denies any overnight aggressive or irritable behavior, and reports patient has been calm on current medication regimen (olanzapine 15 mg twice daily). She states further that patient is being gradually weaned off Precedex per the primary team.   07/08/23: The client stated, "I'm doing ok".  His depression is "just common for me" at a moderate  level with no suicidal ideations, denies past suicide attempts. Anxiety is "always up there", no recent panic attacks.  His sleep was "good", no food at this time per his diet orders.  Denies hallucinations.  Recommended rehab to which he politely stated, "I've been to [facility] times.  "I have all the tools, just need to use them. I have a sponsor and go to AA."  He was pleasant with no complaints, denied withdrawal symptoms.  One of the RN staff reported he did not have any issues today but did fall yesterday.  07/09/23: Today, the client is drowsy on assessment with slurring of speech making it difficult to understand him.  Evidently, they had to increase his Precedex as he became more agitated.  This lead to restraints.  This provider did shorten the assessment as he requested to go home and proceeded to try and get out of bed.  He quickly calmed once the provider was out of his vision field.  Psych will continue to follow and encourage him to go to rehab.  07/10/2023: Upon assessment the client was finishing up working with occupational therapy. OT reports that he did well for his first time moving but is very unsteady on his feet. Per RN he is now off of his Precedex and is alert and oriented to person, place and time with intermittent confusion, he remains in soft wrist restraints. Toady, he rates his depression 8/10 and denies suicidal ideations. He rates his anxiety a 6/10. At this time he remains NPO with tube feeds. He reports that he slept terrible last night. He denies any  cravings or symptoms of withdrawal. Denies hallucinations, homicidal ideations and paranoia. The client is uninterested in rehab services when he is medically cleared. He went to rehab three times in the past, when asking what will be different for him this time the client states, "I . Per client, his sponsor is aware of his current hospital admission. During the assessment the client was cooperative and pleasant.    07/11/2023: Today, the client was resting in his bed and finishing his breakfast. The sitter is at his bedside; the client is no longer in restraints. "I am doing good, I am off of the meds and I am in a different room which means I am closer to getting out of here." He reported that he slept "pretty good". His appetite is "good" and he completed 100% of his breakfast this morning. He rated his anxiety and depression at a 6/10 and denied suicidal ideations along with hallucinations and paranoia. He reports no withdrawal symptoms or cravings. The client remains uninterested in rehab services when he is medically cleared. His plan after discharge remains unchanged from yesterday: "I am going to go to meetings everyday and do the 90/90. I will talk to my sponsor daily." During the assessment the client was cooperative and pleasant.  07/12/2023 Patient seen laying in bed this morning with sitter at bedside. He reports that he is doing much better than he was last week. He is focused on discharge but understands that he needs to have his medications decreased before he is amenable to discharge. He discussed some of his previous employment situations and how he needs to find a new job. He denies any SI/HI/AVH or any paranoid thoughts about how the treatment team is treating him. He does not have any interest in substance abuse treatment currently.   Diagnoses:  Active Hospital problems: Principal Problem:   Alcohol withdrawal (HCC) Active Problems:   DTs (delirium tremens) (HCC)   Acute hypoxic respiratory failure (HCC)   Plan   ## Psychiatric Medication Recommendations:  Alcohol withdrawal Hyperactive delirium - Delirium precautions - Decrease Olanzapine 10 mg BID will begin to taper tomorrow if he does well with the Ativan decrease - Continue Depakote 500 mg BID  - Continue Thiamine/Folic acid/multivitamins.  -- Continue Melatonin nightly -- Ativan 2 mg TID decreased to 1 mg TID  ## Medical  Decision Making Capacity:  - None  ## Further Work-up:  -- Monitor labs regularly  ## Disposition:  -- Declined rehab, he does have a sponsor and attends AA  ## Behavioral / Environmental:  --  Delirium precautions  ## Safety and Observation Level:  - Based on my clinical evaluation, I estimate the patient to be at low risk of self harm in the current setting - At this time, we recommend a intermediate level of observation. This decision is based on my review of the chart including patient's history and current presentation, interview of the patient, mental status examination, and consideration of suicide risk including evaluating suicidal ideation, plan, intent, suicidal or self-harm behaviors, risk factors, and protective factors. This judgment is based on our ability to directly address suicide risk, implement suicide prevention strategies and develop a safety plan while the patient is in the clinical setting. Please contact our team if there is a concern that risk level has changed.  Suicide risk assessment  Patient has following modifiable risk factors for suicide: delirium which we are addressing by psychopharm and non-psychopharm approaches  Patient has following non-modifiable or demographic risk factors  for suicide: male gender and psychiatric hospitalization  Patient has the following protective factors against suicide: no history of suicide attempts and no history of NSSIB  Thank you for this consult request. Recommendations have been communicated to the primary team.  We will continue to follow up at this time.   Harlin Heys, DO  Psychiatric and Social History   Relevant Aspects of Hospital Course:   Psychiatric Specialty Exam: Physical Exam Vitals and nursing note reviewed.  Constitutional:      Appearance: Normal appearance.  HENT:     Head: Normocephalic.  Pulmonary:     Effort: Pulmonary effort is normal.  Neurological:     General: No focal deficit  present.     Mental Status: He is alert and oriented to person, place, and time.     Review of Systems  Constitutional:  Positive for fatigue.  Psychiatric/Behavioral:  Positive for dysphoric mood. The patient is nervous/anxious.     Blood pressure (!) 128/96, pulse 92, temperature 99.2 F (37.3 C), temperature source Oral, resp. rate 20, height 5\' 10"  (1.778 m), weight 88.7 kg, SpO2 92%.Body mass index is 28.06 kg/m.  General Appearance: Casual  Eye Contact:  Good  Speech:  Normal Rate  Volume:  Normal  Mood:  Anxious and depressed  Affect:  Congruent  Thought Process:  Coherent  Orientation:  Full (Time, Place, and Person)  Thought Content:  Logical  Suicidal Thoughts:  No  Homicidal Thoughts:  No  Memory:  Immediate;   Good Recent;   Good Remote;   Good  Judgement:  Fair  Insight:  Fair  Psychomotor Activity:  Decreased  Concentration:  Concentration: Good and Attention Span: Good  Recall:  Good  Fund of Knowledge:  Good  Language:  Good  Akathisia:  No  Handed:  Right  AIMS (if indicated):     Assets:  Housing Leisure Time Resilience Social Support  ADL's:  Intact  Cognition:  WNL  Sleep:         Exam Findings   Physical Exam:  Denies physical issues Vital signs:  Temp:  [98.2 F (36.8 C)-99.2 F (37.3 C)] 99.2 F (37.3 C) (02/14 0705) Pulse Rate:  [83-92] 92 (02/14 0705) Resp:  [13-20] 20 (02/14 0705) BP: (112-141)/(84-96) 128/96 (02/14 0705) SpO2:  [90 %-98 %] 92 % (02/14 0705) Weight:  [88.7 kg] 88.7 kg (02/14 0500) Physical Exam Vitals and nursing note reviewed.  Constitutional:      Appearance: Normal appearance.  HENT:     Head: Normocephalic.  Pulmonary:     Effort: Pulmonary effort is normal.  Neurological:     General: No focal deficit present.     Mental Status: He is alert and oriented to person, place, and time.     Blood pressure (!) 128/96, pulse 92, temperature 99.2 F (37.3 C), temperature source Oral, resp. rate 20, height  5\' 10"  (1.778 m), weight 88.7 kg, SpO2 92%. Body mass index is 28.06 kg/m.   Other History   These have been pulled in through the EMR, reviewed, and updated if appropriate.   Family History:   The patient's family history is not on file. He was adopted.  Medical History: Past Medical History:  Diagnosis Date   Alcohol abuse    Anxiety    Class 1 obesity 09/14/2021   Depression     Surgical History: Past Surgical History:  Procedure Laterality Date   HERNIA REPAIR      Medications:   Current  Facility-Administered Medications:    acetaminophen (TYLENOL) tablet 650 mg, 650 mg, Oral, Q6H PRN **OR** acetaminophen (TYLENOL) suppository 650 mg, 650 mg, Rectal, Q6H PRN, [place], Eric J, DO   bisacodyl (DULCOLAX) suppository 10 mg, 10 mg, Rectal, Daily PRN, Selmer Dominion B, NP   Chlorhexidine Gluconate Cloth 2 % PADS 6 each, 6 each, Topical, Daily, Opyd, Lavone Neri, MD, 6 each at 07/09/23 1024   divalproex (DEPAKOTE) DR tablet 500 mg, 500 mg, Oral, Q12H, [place], Alvira Philips, DO, 500 mg at 07/12/23 0936   enoxaparin (LOVENOX) injection 40 mg, 40 mg, Subcutaneous, Q24H, Pham, Anh P, RPH, 40 mg at 07/11/23 1133   feeding supplement (ENSURE ENLIVE / ENSURE PLUS) liquid 237 mL, 237 mL, Oral, BID BM, [place], Eric J, DO, 237 mL at 07/12/23 0945   folic acid (FOLVITE) tablet 1 mg, 1 mg, Oral, Daily, [place], Alvira Philips, DO, 1 mg at 07/12/23 1478   gabapentin (NEURONTIN) capsule 200 mg, 200 mg, Oral, TID, [place], Alvira Philips, DO, 200 mg at 07/12/23 0936   haloperidol lactate (HALDOL) injection 5 mg, 5 mg, Intravenous, Q8H PRN, Hunsucker, Lesia Sago, MD, 5 mg at 07/11/23 1407   hydrALAZINE (APRESOLINE) injection 10 mg, 10 mg, Intravenous, Q6H PRN, Paliwal, Aditya, MD, 10 mg at 06/30/23 2218   lactulose (CHRONULAC) 10 GM/15ML solution 10 g, 10 g, Oral, BID, [place], Alvira Philips, DO, 10 g at 07/12/23 2956   LORazepam (ATIVAN) tablet 1 mg, 1 mg, Oral, BID, [place], Alvira Philips, DO, 1 mg at 07/12/23 2130   LORazepam  (ATIVAN) tablet 1 mg, 1 mg, Oral, QHS, [place], Eric J, DO, 1 mg at 07/11/23 2115   melatonin tablet 3 mg, 3 mg, Oral, QHS, [place], Eric J, DO, 3 mg at 07/11/23 2115   metoCLOPramide (REGLAN) injection 10 mg, 10 mg, Intravenous, Q8H, Lorin Glass, MD, 10 mg at 07/12/23 0556   metoprolol tartrate (LOPRESSOR) tablet 25 mg, 25 mg, Oral, BID, [place], Alvira Philips, DO, 25 mg at 07/12/23 8657   multivitamin with minerals tablet 1 tablet, 1 tablet, Oral, Daily, [place], Alvira Philips, DO, 1 tablet at 07/12/23 0936   OLANZapine zydis (ZYPREXA) disintegrating tablet 20 mg, 20 mg, Oral, BID, [place], Alvira Philips, DO, 20 mg at 07/12/23 8469   Oral care mouth rinse, 15 mL, Mouth Rinse, PRN, Luciano Cutter, MD   oxyCODONE (Oxy IR/ROXICODONE) immediate release tablet 15 mg, 15 mg, Oral, Q6H, [place], Alvira Philips, DO, 15 mg at 07/12/23 0556   [COMPLETED] PHENobarbital (LUMINAL) tablet 64.8 mg, 64.8 mg, Oral, BID, 64.8 mg at 07/11/23 2114 **FOLLOWED BY** PHENobarbital (LUMINAL) tablet 32.4 mg, 32.4 mg, Oral, BID, [place], Eric J, DO, 32.4 mg at 07/12/23 6295   polyethylene glycol (MIRALAX / GLYCOLAX) packet 17 g, 17 g, Oral, Daily, [place], Eric J, DO   sodium chloride flush (NS) 0.9 % injection 10 mL, 10 mL, Intravenous, PRN, Marchelle Gearing, Carmin Muskrat, MD, 10 mL at 07/09/23 2359   sodium chloride flush (NS) 0.9 % injection 3 mL, 3 mL, Intravenous, Q12H, Opyd, Lavone Neri, MD, 3 mL at 07/12/23 0943   [COMPLETED] thiamine (VITAMIN B1) 500 mg in sodium chloride 0.9 % 50 mL IVPB, 500 mg, Intravenous, Q8H, Stopped at 07/09/23 2321 **FOLLOWED BY** thiamine (VITAMIN B1) tablet 100 mg, 100 mg, Oral, Daily, [place], Alvira Philips, DO, 100 mg at 07/12/23 2841  Allergies: Allergies  Allergen Reactions   Ketamine     Hx of causing fever in 2024    Codeine Itching

## 2023-07-12 NOTE — Progress Notes (Signed)
Nutrition Follow-up  DOCUMENTATION CODES:   Not applicable  INTERVENTION:  - Regular diet.  - Ensure Plus High Protein po BID, each supplement provides 350 kcal and 20 grams of protein. - Continue Multivitamin with minerals daily, folic acid, and thiamine for etoh dependence. - Given alcohol abuse, checking vitamin labs: - Thiamine, Vitamin B6, and Vitamin B12: all WNL - Monitor weight trends.  NUTRITION DIAGNOSIS:   Increased nutrient needs related to chronic illness as evidenced by estimated needs. *ongoing  GOAL:   Patient will meet greater than or equal to 90% of their needs *likely met  MONITOR:   TF tolerance  REASON FOR ASSESSMENT:   Consult Enteral/tube feeding initiation and management  ASSESSMENT:   58 y.o. male who has a PMH as below including but not limited to anxiety, depression, EtOH dependence. He presented to urgent care 1/28 with confusion, tremors, anxiety, presyncopal symptoms.  1/28 - Admit 2/3 - NGT placed, Osmolite 1.5 started @ 45mL/hr 2/12 Advanced to a Reg diet; NGT removed  Patient sleeping at time of visit.  NT at bedside reports patient ate very well this morning, had 2 breakfasts trays.  He is documented to be consuming 95-100% of meals.  NT also reports he seems to like the Ensure he is receiving. Documented to be consuming.     Admit weight: 200# Current weight: 195# I&O's: +2.8L since 1/31 Monitoring weight trends.   Medications reviewed and include: 1mg  folic acid, MVI, 100mg  thiamine, Miralax  Labs reviewed:  -  Latest Reference Range & Units 07/02/23 06:25  Vitamin B1 (Thiamine) 66.5 - 200.0 nmol/L 195.5  Vitamin B12 180 - 914 pg/mL 816  Vitamin B6 3.4 - 65.2 ug/L 11.1     Diet Order:   Diet Order             Diet regular Room service appropriate? Yes with Assist; Fluid consistency: Thin  Diet effective now                   EDUCATION NEEDS:  Not appropriate for education at this time  Skin:  Skin  Assessment: Reviewed RN Assessment  Last BM:  2/13 - type 7  Height:  Ht Readings from Last 1 Encounters:  06/25/23 5\' 10"  (1.778 m)   Weight:  Wt Readings from Last 1 Encounters:  07/12/23 88.7 kg    BMI:  Body mass index is 28.06 kg/m.  Estimated Nutritional Needs:  Kcal:  2200-2400 Protein:  100-115g Fluid:  2.2L/day    Shelle Iron RD, LDN Contact via Secure Chat.

## 2023-07-12 NOTE — Plan of Care (Signed)

## 2023-07-12 NOTE — Progress Notes (Signed)
Mobility Specialist - Progress Note   07/12/23 1207  Mobility  Activity Ambulated with assistance in hallway  Level of Assistance Minimal assist, patient does 75% or more  Assistive Device Front wheel walker  Distance Ambulated (ft) 200 ft  Range of Motion/Exercises Active  Activity Response Tolerated fair  Mobility Referral Yes  Mobility visit 1 Mobility  Mobility Specialist Start Time (ACUTE ONLY) 1151  Mobility Specialist Stop Time (ACUTE ONLY) 1207  Mobility Specialist Time Calculation (min) (ACUTE ONLY) 16 min   Pt was found in bed and agreeable to ambulate. Was min-A for STS and CG for ambulation. Requires safety cues during ambulation. At EOS returned to bed with all needs met. Sitter in room and RN notified of session.  Billey Chang Mobility Specialist

## 2023-07-23 ENCOUNTER — Inpatient Hospital Stay (HOSPITAL_COMMUNITY)
Admission: EM | Admit: 2023-07-23 | Discharge: 2023-07-27 | DRG: 894 | Discharge status: Against Medical Advice | Payer: BLUE CROSS/BLUE SHIELD | Attending: Student | Admitting: Student

## 2023-07-23 DIAGNOSIS — Z781 Physical restraint status: Secondary | ICD-10-CM | POA: Diagnosis not present

## 2023-07-23 DIAGNOSIS — F10931 Alcohol use, unspecified with withdrawal delirium: Principal | ICD-10-CM | POA: Diagnosis present

## 2023-07-23 DIAGNOSIS — F10229 Alcohol dependence with intoxication, unspecified: Secondary | ICD-10-CM | POA: Diagnosis present

## 2023-07-23 DIAGNOSIS — E66811 Obesity, class 1: Secondary | ICD-10-CM | POA: Diagnosis not present

## 2023-07-23 DIAGNOSIS — D696 Thrombocytopenia, unspecified: Secondary | ICD-10-CM | POA: Diagnosis present

## 2023-07-23 DIAGNOSIS — F32A Depression, unspecified: Secondary | ICD-10-CM | POA: Diagnosis present

## 2023-07-23 DIAGNOSIS — F10231 Alcohol dependence with withdrawal delirium: Principal | ICD-10-CM | POA: Diagnosis present

## 2023-07-23 DIAGNOSIS — Y908 Blood alcohol level of 240 mg/100 ml or more: Secondary | ICD-10-CM | POA: Diagnosis present

## 2023-07-23 DIAGNOSIS — G312 Degeneration of nervous system due to alcohol: Secondary | ICD-10-CM | POA: Diagnosis present

## 2023-07-23 DIAGNOSIS — D751 Secondary polycythemia: Secondary | ICD-10-CM | POA: Diagnosis present

## 2023-07-23 DIAGNOSIS — Z885 Allergy status to narcotic agent status: Secondary | ICD-10-CM

## 2023-07-23 DIAGNOSIS — E860 Dehydration: Secondary | ICD-10-CM | POA: Diagnosis present

## 2023-07-23 DIAGNOSIS — R7401 Elevation of levels of liver transaminase levels: Secondary | ICD-10-CM | POA: Diagnosis not present

## 2023-07-23 DIAGNOSIS — R253 Fasciculation: Secondary | ICD-10-CM | POA: Diagnosis present

## 2023-07-23 DIAGNOSIS — R64 Cachexia: Secondary | ICD-10-CM | POA: Diagnosis present

## 2023-07-23 DIAGNOSIS — F419 Anxiety disorder, unspecified: Secondary | ICD-10-CM | POA: Diagnosis present

## 2023-07-23 DIAGNOSIS — F10939 Alcohol use, unspecified with withdrawal, unspecified: Secondary | ICD-10-CM | POA: Diagnosis present

## 2023-07-23 DIAGNOSIS — R000 Tachycardia, unspecified: Secondary | ICD-10-CM | POA: Diagnosis present

## 2023-07-23 DIAGNOSIS — I959 Hypotension, unspecified: Secondary | ICD-10-CM | POA: Diagnosis not present

## 2023-07-23 DIAGNOSIS — Z6826 Body mass index (BMI) 26.0-26.9, adult: Secondary | ICD-10-CM | POA: Diagnosis not present

## 2023-07-23 DIAGNOSIS — R001 Bradycardia, unspecified: Secondary | ICD-10-CM | POA: Diagnosis not present

## 2023-07-23 DIAGNOSIS — F10129 Alcohol abuse with intoxication, unspecified: Secondary | ICD-10-CM | POA: Diagnosis present

## 2023-07-23 LAB — CBC WITH DIFFERENTIAL/PLATELET
Abs Immature Granulocytes: 0.02 10*3/uL (ref 0.00–0.07)
Basophils Absolute: 0 10*3/uL (ref 0.0–0.1)
Basophils Relative: 0 %
Eosinophils Absolute: 0 10*3/uL (ref 0.0–0.5)
Eosinophils Relative: 0 %
HCT: 53.5 % — ABNORMAL HIGH (ref 39.0–52.0)
Hemoglobin: 17.6 g/dL — ABNORMAL HIGH (ref 13.0–17.0)
Immature Granulocytes: 0 %
Lymphocytes Relative: 31 %
Lymphs Abs: 3 10*3/uL (ref 0.7–4.0)
MCH: 31.4 pg (ref 26.0–34.0)
MCHC: 32.9 g/dL (ref 30.0–36.0)
MCV: 95.4 fL (ref 80.0–100.0)
Monocytes Absolute: 0.6 10*3/uL (ref 0.1–1.0)
Monocytes Relative: 6 %
Neutro Abs: 5.9 10*3/uL (ref 1.7–7.7)
Neutrophils Relative %: 63 %
Platelets: 303 10*3/uL (ref 150–400)
RBC: 5.61 MIL/uL (ref 4.22–5.81)
RDW: 14 % (ref 11.5–15.5)
WBC: 9.5 10*3/uL (ref 4.0–10.5)
nRBC: 0 % (ref 0.0–0.2)

## 2023-07-23 LAB — COMPREHENSIVE METABOLIC PANEL
ALT: 124 U/L — ABNORMAL HIGH (ref 0–44)
AST: 136 U/L — ABNORMAL HIGH (ref 15–41)
Albumin: 4.8 g/dL (ref 3.5–5.0)
Alkaline Phosphatase: 62 U/L (ref 38–126)
Anion gap: 19 — ABNORMAL HIGH (ref 5–15)
BUN: 11 mg/dL (ref 6–20)
CO2: 24 mmol/L (ref 22–32)
Calcium: 8.8 mg/dL — ABNORMAL LOW (ref 8.9–10.3)
Chloride: 99 mmol/L (ref 98–111)
Creatinine, Ser: 1.06 mg/dL (ref 0.61–1.24)
GFR, Estimated: >60 mL/min (ref >60)
Glucose, Bld: 101 mg/dL — ABNORMAL HIGH (ref 70–99)
Potassium: 4.2 mmol/L (ref 3.5–5.1)
Sodium: 142 mmol/L (ref 135–145)
Total Bilirubin: 0.9 mg/dL (ref 0.0–1.2)
Total Protein: 8.3 g/dL — ABNORMAL HIGH (ref 6.5–8.1)

## 2023-07-23 LAB — MAGNESIUM: Magnesium: 1.9 mg/dL (ref 1.7–2.4)

## 2023-07-23 LAB — ETHANOL: Alcohol, Ethyl (B): 331 mg/dL (ref <10)

## 2023-07-23 LAB — MRSA NEXT GEN BY PCR, NASAL: MRSA by PCR Next Gen: NOT DETECTED

## 2023-07-23 MED ORDER — SODIUM CHLORIDE 0.9 % IV SOLN: 1.0000 mg | Freq: Once | INTRAVENOUS | Status: DC

## 2023-07-23 MED ORDER — DEXMEDETOMIDINE HCL IN NACL 400 MCG/100ML IV SOLN
0.0000 ug/kg/h | INTRAVENOUS | Status: DC
  Administered 2023-07-23: 0.4 ug/kg/h via INTRAVENOUS
  Administered 2023-07-23: 0.2 ug/kg/h via INTRAVENOUS
  Administered 2023-07-24: 0.7 ug/kg/h via INTRAVENOUS
  Administered 2023-07-24: 0.8 ug/kg/h via INTRAVENOUS
  Administered 2023-07-24: 0.7 ug/kg/h via INTRAVENOUS
  Administered 2023-07-25: 0.8 ug/kg/h via INTRAVENOUS
  Administered 2023-07-26: 0.9 ug/kg/h via INTRAVENOUS
  Administered 2023-07-26: 0.7 ug/kg/h via INTRAVENOUS
  Administered 2023-07-26: 0.5 ug/kg/h via INTRAVENOUS
  Administered 2023-07-27: 0.2 ug/kg/h via INTRAVENOUS
  Filled 2023-07-23 (×8): qty 100

## 2023-07-23 MED ORDER — LORAZEPAM 1 MG PO TABS
0.0000 mg | ORAL_TABLET | ORAL | Status: DC
Start: 2023-07-23 — End: 2023-07-25
  Administered 2023-07-23: 2 mg via ORAL
  Administered 2023-07-24 – 2023-07-25 (×3): 4 mg via ORAL
  Administered 2023-07-25: 1 mg via ORAL
  Filled 2023-07-23: qty 2
  Filled 2023-07-23 (×4): qty 4

## 2023-07-23 MED ORDER — LORAZEPAM 1 MG PO TABS: 2.0000 mg | ORAL_TABLET | Freq: Once | ORAL | Status: DC

## 2023-07-23 MED ORDER — DEXTROSE 5 % AND 0.9 % NACL IV BOLUS
1000.0000 mL | Freq: Once | INTRAVENOUS | Status: AC
Start: 2023-07-23 — End: 2023-07-24
  Administered 2023-07-23: 1000 mL via INTRAVENOUS

## 2023-07-23 MED ORDER — PHENOBARBITAL 32.4 MG PO TABS
64.8000 mg | ORAL_TABLET | Freq: Once | ORAL | Status: AC
  Administered 2023-07-23: 64.8 mg via ORAL
  Filled 2023-07-23: qty 2

## 2023-07-23 MED ORDER — MIRTAZAPINE 15 MG PO TABS
30.0000 mg | ORAL_TABLET | Freq: Every day | ORAL | Status: DC
  Administered 2023-07-23 – 2023-07-26 (×4): 30 mg via ORAL
  Filled 2023-07-23 (×4): qty 2

## 2023-07-23 MED ORDER — LORAZEPAM 1 MG PO TABS: 1.0000 mg | ORAL_TABLET | ORAL | Status: DC | PRN

## 2023-07-23 MED ORDER — ACETAMINOPHEN 325 MG PO TABS: 650.0000 mg | ORAL_TABLET | Freq: Four times a day (QID) | ORAL | Status: DC | PRN

## 2023-07-23 MED ORDER — ORAL CARE MOUTH RINSE: 15.0000 mL | OROMUCOSAL | Status: DC | PRN

## 2023-07-23 MED ORDER — ADULT MULTIVITAMIN W/MINERALS CH
1.0000 | ORAL_TABLET | Freq: Every day | ORAL | Status: DC
  Administered 2023-07-23 – 2023-07-27 (×4): 1 via ORAL
  Filled 2023-07-23 (×4): qty 1

## 2023-07-23 MED ORDER — FOLIC ACID 1 MG PO TABS
1.0000 mg | ORAL_TABLET | Freq: Every day | ORAL | Status: DC
  Administered 2023-07-23 – 2023-07-27 (×4): 1 mg via ORAL
  Filled 2023-07-23 (×4): qty 1

## 2023-07-23 MED ORDER — SODIUM CHLORIDE 0.9 % IV SOLN
260.0000 mg | Freq: Once | INTRAVENOUS | Status: DC
  Filled 2023-07-23: qty 2

## 2023-07-23 MED ORDER — FOLIC ACID 5 MG/ML IJ SOLN
1.0000 mg | Freq: Once | INTRAMUSCULAR | Status: AC
  Administered 2023-07-23: 1 mg via INTRAVENOUS
  Filled 2023-07-23: qty 0.2

## 2023-07-23 MED ORDER — THIAMINE MONONITRATE 100 MG PO TABS
100.0000 mg | ORAL_TABLET | Freq: Every day | ORAL | Status: DC
  Administered 2023-07-23 – 2023-07-27 (×4): 100 mg via ORAL
  Filled 2023-07-23 (×4): qty 1

## 2023-07-23 MED ORDER — LORAZEPAM 1 MG PO TABS
0.0000 mg | ORAL_TABLET | Freq: Three times a day (TID) | ORAL | Status: DC
  Administered 2023-07-25: 4 mg via ORAL
  Filled 2023-07-23 (×2): qty 4

## 2023-07-23 MED ORDER — THIAMINE HCL 100 MG/ML IJ SOLN
100.0000 mg | Freq: Once | INTRAMUSCULAR | Status: AC
Start: 2023-07-23 — End: 2023-07-23
  Administered 2023-07-23: 100 mg via INTRAVENOUS
  Filled 2023-07-23: qty 2

## 2023-07-23 MED ORDER — LORAZEPAM 2 MG/ML IJ SOLN
2.0000 mg | Freq: Once | INTRAMUSCULAR | Status: AC
  Administered 2023-07-23: 2 mg via INTRAVENOUS
  Filled 2023-07-23: qty 1

## 2023-07-23 MED ORDER — LORAZEPAM 2 MG/ML IJ SOLN
1.0000 mg | INTRAMUSCULAR | Status: DC | PRN
  Administered 2023-07-23: 2 mg via INTRAVENOUS
  Administered 2023-07-24 (×3): 4 mg via INTRAVENOUS
  Administered 2023-07-24: 2 mg via INTRAVENOUS
  Administered 2023-07-24 (×2): 4 mg via INTRAVENOUS
  Administered 2023-07-24: 3 mg via INTRAVENOUS
  Administered 2023-07-24 – 2023-07-25 (×4): 2 mg via INTRAVENOUS
  Administered 2023-07-25: 4 mg via INTRAVENOUS
  Administered 2023-07-25: 2 mg via INTRAVENOUS
  Administered 2023-07-26: 4 mg via INTRAVENOUS
  Filled 2023-07-23: qty 2
  Filled 2023-07-23: qty 1
  Filled 2023-07-23 (×6): qty 2
  Filled 2023-07-23: qty 1
  Filled 2023-07-23: qty 2
  Filled 2023-07-23: qty 1
  Filled 2023-07-23: qty 2
  Filled 2023-07-23 (×3): qty 1
  Filled 2023-07-23: qty 2
  Filled 2023-07-23: qty 1
  Filled 2023-07-23: qty 2
  Filled 2023-07-23: qty 1

## 2023-07-23 MED ORDER — PHENOBARBITAL 32.4 MG PO TABS
97.2000 mg | ORAL_TABLET | Freq: Once | ORAL | Status: AC
  Administered 2023-07-23: 97.2 mg via ORAL
  Filled 2023-07-23: qty 3

## 2023-07-23 MED ORDER — DEXTROSE 5 % AND 0.9 % NACL IV BOLUS
1000.0000 mL | Freq: Once | INTRAVENOUS | Status: AC
  Administered 2023-07-23: 1000 mL via INTRAVENOUS

## 2023-07-23 MED ORDER — HYDROXYZINE HCL 25 MG PO TABS
50.0000 mg | ORAL_TABLET | Freq: Three times a day (TID) | ORAL | Status: DC | PRN
  Administered 2023-07-24 – 2023-07-26 (×3): 50 mg via ORAL
  Filled 2023-07-23 (×3): qty 2

## 2023-07-23 MED ORDER — CHLORHEXIDINE GLUCONATE CLOTH 2 % EX PADS
6.0000 | MEDICATED_PAD | Freq: Every day | CUTANEOUS | Status: DC
Start: 2023-07-23 — End: 2023-07-27
  Administered 2023-07-23 – 2023-07-26 (×4): 6 via TOPICAL

## 2023-07-23 MED ORDER — LORAZEPAM 2 MG/ML IJ SOLN: 4.0000 mg | Freq: Once | INTRAMUSCULAR | Status: DC

## 2023-07-23 MED ORDER — ACETAMINOPHEN 650 MG RE SUPP: 650.0000 mg | Freq: Four times a day (QID) | RECTAL | Status: DC | PRN

## 2023-07-23 MED ORDER — LACTATED RINGERS IV BOLUS
1000.0000 mL | Freq: Once | INTRAVENOUS | Status: AC
  Administered 2023-07-23: 1000 mL via INTRAVENOUS

## 2023-07-23 MED ORDER — SODIUM CHLORIDE 0.9 % IV SOLN
260.0000 mg | Freq: Once | INTRAVENOUS | Status: AC
  Administered 2023-07-23: 260 mg via INTRAVENOUS
  Filled 2023-07-23: qty 2

## 2023-07-23 MED ORDER — ONDANSETRON HCL 4 MG PO TABS
4.0000 mg | ORAL_TABLET | Freq: Four times a day (QID) | ORAL | Status: DC | PRN
Start: 2023-07-23 — End: 2023-07-27

## 2023-07-23 MED ORDER — ONDANSETRON HCL 4 MG/2ML IJ SOLN: 4.0000 mg | Freq: Four times a day (QID) | INTRAMUSCULAR | Status: DC | PRN

## 2023-07-23 MED ORDER — THIAMINE HCL 100 MG/ML IJ SOLN
100.0000 mg | Freq: Every day | INTRAMUSCULAR | Status: DC
  Administered 2023-07-26: 100 mg via INTRAVENOUS
  Filled 2023-07-23 (×2): qty 2

## 2023-07-23 MED ORDER — MAGNESIUM SULFATE 2 GM/50ML IV SOLN
2.0000 g | Freq: Once | INTRAVENOUS | Status: AC
  Administered 2023-07-23: 2 g via INTRAVENOUS
  Filled 2023-07-23: qty 50

## 2023-07-23 NOTE — ED Provider Notes (Signed)
 [place] EMERGENCY DEPARTMENT AT [facility] Provider Note  CSN: [id number] Arrival date & time: 07/23/23 2440  Chief Complaint(s) Alcohol Problem  HPI Bentlee Benningfield is a 58 y.o. male here today for alcohol drawl.  Patient last drink 5 hours ago.  He drinks a bottle of vodka every single day.  He was admitted from the end of January to the middle of February for delirium tremens.  On Precedex drip.   Past Medical History Past Medical History:  Diagnosis Date   Alcohol abuse    Anxiety    Class 1 obesity 09/14/2021   Depression    Patient Active Problem List   Diagnosis Date Noted   Acute hypoxic respiratory failure (HCC) 06/26/2023   C. 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 Authorizing Provider  folic acid (FOLVITE) 1 MG tablet Take 1 tablet (1 mg total) by mouth daily. Patient not taking: Reported on 05/21/2023 01/28/23   Alberteen Sam, MD  hydrOXYzine (ATARAX) 25 MG tablet Take 50 mg by mouth every 8 (eight) hours as needed for anxiety. 06/10/23   [provider]  mirtazapine (REMERON) 30 MG tablet Take 30 mg by mouth at bedtime. Patient not taking: Reported on 06/25/2023    [provider]  thiamine (VITAMIN B-1) 100 MG tablet Take 1 tablet (100 mg total) by mouth daily. Patient not taking: Reported on 06/25/2023 05/20/22   [name], MD                                                                                                                                     Past Surgical History Past Surgical History:  Procedure Laterality Date   HERNIA REPAIR     Family History Family History  Adopted: Yes    Social History Social History   Tobacco Use   Smoking status: Never    Passive exposure: Never   Smokeless tobacco: Never  Vaping Use   Vaping status: Never Used  Substance Use Topics   Alcohol use: Yes    Alcohol/week: 20.0 standard drinks of alcohol    Types: 20 Standard drinks or equivalent per week   Drug use: Not Currently   Allergies Ketamine and Codeine  Review of Systems Review of Systems  Physical Exam Vital Signs  I have reviewed the triage vital signs BP (!) 142/115 (BP Location: Left Arm)   Pulse Marland Kitchen)  150   Temp 98.8 F (37.1 C) (Oral)   Resp (!) 21   SpO2 99%   Physical Exam Vitals reviewed.  Cardiovascular:     Rate and Rhythm: Normal rate.  Musculoskeletal:        General: Normal range of motion.  Neurological:     Mental Status: He is alert.     Comments: Tremulous     ED Results and Treatments Labs (all labs ordered are listed, but only abnormal results are displayed) Labs Reviewed  CBC WITH DIFFERENTIAL/PLATELET - Abnormal; Notable for the following components:      Result Value   Hemoglobin 17.6 (*)    HCT 53.5 (*)    All other components within normal limits  COMPREHENSIVE METABOLIC PANEL - Abnormal; Notable for the following components:   Glucose, Bld 101 (*)    Calcium 8.8 (*)    Total Protein 8.3 (*)    AST 136 (*)    ALT 124 (*)    Anion gap 19 (*)    All other components within normal limits  ETHANOL - Abnormal; Notable for the following components:   Alcohol, Ethyl (B) 331 (*)    All other components within normal limits  MAGNESIUM  URINALYSIS, ROUTINE W REFLEX MICROSCOPIC                                                                                                                           Radiology No results found.  Pertinent labs & imaging results that were available during my care of the patient were reviewed by me and considered in my medical decision making (see MDM for details).  Medications Ordered in ED Medications  lactated ringers bolus 1,000 mL (has no administration in time range)  dexmedetomidine (PRECEDEX) 400 MCG/100ML (4 mcg/mL) infusion (has no administration in time range)  PHENobarbital (LUMINAL) tablet 97.2 mg (97.2 mg Oral Given 07/23/23 0739)  PHENobarbital (LUMINAL) tablet 64.8 mg (64.8 mg Oral Given 07/23/23 0906)  thiamine (VITAMIN B1) injection 100 mg (100 mg Intravenous Given 07/23/23 1047)  PHENObarbital (LUMINAL) 260 mg in sodium chloride 0.9 % 100 mL IVPB (0 mg Intravenous Stopped 07/23/23 1048)  folic acid injection 1 mg (1 mg Intravenous Given 07/23/23 1029)  Procedures .Critical Care  Performed by: Arletha Pili, DO Authorized by: Arletha Pili, DO   Critical care provider statement:    Critical care time (minutes):  75   Critical care was necessary to treat or prevent imminent or life-threatening deterioration of the following conditions:  CNS failure or compromise   Critical care was time spent personally by me on the following activities:  Development of treatment plan with patient or surrogate, discussions with consultants, evaluation of patient's response to treatment, examination of patient, ordering and review of laboratory studies, ordering and review of radiographic studies, ordering and performing treatments and interventions, pulse oximetry, re-evaluation of patient's condition and review of old charts   (including critical care time)  Medical Decision Making / ED Course   This patient presents to the ED for concern of alcohol withdrawal, this involves an extensive number of treatment  options, and is a complaint that carries with it a high risk of complications and morbidity.  The differential diagnosis includes DT's, alcohol withdrawal.  MDM: 59 year old male here today for alcohol withdrawal.  On exam, patient tremulous, tongue fasciculations, heavy alcohol abuse history.  Less than 5 hours ago.  Ethanol here is 330.  He was seen here previously, alcohol level was 280, patient continued to experience withdrawal at that level.  I have ordered high-dose phenobarbital for the patient.  Folic acid pending ordered.  Anticipate ICU admission.  Reassessment 11:20 AM-patient feeling bit better after receiving 680 of phenobarb, just below is 10 mg/kg load.  Patient continued to experience tremors, and concern for delirium tremens.  Have ordered Precedex drip for the patient.  Will admit patient to hospital.   Additional history obtained: -Additional history obtained from  -External records from outside source obtained and reviewed including: Chart review including previous notes, labs, imaging, consultation notes   Lab Tests: -I ordered, reviewed, and interpreted labs.   The pertinent results include:   Labs Reviewed  CBC WITH DIFFERENTIAL/PLATELET - Abnormal; Notable for the following components:      Result Value   Hemoglobin 17.6 (*)    HCT 53.5 (*)    All other components within normal limits  COMPREHENSIVE METABOLIC PANEL - Abnormal; Notable for the following components:   Glucose, Bld 101 (*)    Calcium 8.8 (*)    Total Protein 8.3 (*)    AST 136 (*)    ALT 124 (*)    Anion gap 19 (*)    All other components within normal limits  ETHANOL - Abnormal; Notable for the following components:   Alcohol, Ethyl (B) 331 (*)    All other components within normal limits  MAGNESIUM  URINALYSIS, ROUTINE W REFLEX MICROSCOPIC      EKG sinus tachycardia  EKG Interpretation Date/Time:  Tuesday July 23 2023 07:29:37 EST Ventricular Rate:  149 PR Interval:  118 QRS  Duration:  90 QT Interval:  286 QTC Calculation: 451 R Axis:   114  Text Interpretation: Sinus tachycardia Ventricular premature complex Aberrant complex Right axis deviation Confirmed by Lorre Nick ([id number]) on 07/23/2023 8:45:20 AM         Medicines ordered and prescription drug management: Meds ordered this encounter  Medications   DISCONTD: LORazepam (ATIVAN) tablet 2 mg   PHENobarbital (LUMINAL) tablet 97.2 mg   PHENobarbital (LUMINAL) tablet 64.8 mg   thiamine (VITAMIN B1) injection 100 mg   DISCONTD: folic acid 1 mg in sodium chloride 0.9 % 50 mL IVPB   PHENObarbital (LUMINAL) 260 mg  in sodium chloride 0.9 % 100 mL IVPB   folic acid injection 1 mg   DISCONTD: PHENObarbital (LUMINAL) 260 mg in sodium chloride 0.9 % 100 mL IVPB   lactated ringers bolus 1,000 mL   dexmedetomidine (PRECEDEX) 400 MCG/100ML (4 mcg/mL) infusion    -I have reviewed the patients home medicines and have made adjustments as needed  Critical interventions The management of acute alcohol withdrawal  Cardiac Monitoring: The patient was maintained on a cardiac monitor.  I personally viewed and interpreted the cardiac monitored which showed an underlying rhythm of: Sinus tachycardia  Social Determinants of Health:  Factors impacting patients care include: Alcohol abuse   Reevaluation: After the interventions noted above, I reevaluated the patient and found that they have :improved  Co morbidities that complicate the patient evaluation  Past Medical History:  Diagnosis Date   Alcohol abuse    Anxiety    Class 1 obesity 09/14/2021   Depression       Dispostion: Admission     Final Clinical Impression(s) / ED Diagnoses Final diagnoses:  Alcohol withdrawal syndrome, with delirium (HCC)     @PCDICTATION @    Anders Simmonds T, DO 07/23/23 1129

## 2023-07-23 NOTE — ED Triage Notes (Signed)
 Pt arrives via [facility] from home c/o alcohol withdrawal. Pt reports drinking a bottle of vodka each day for the last three months, last drink was approximately four hours ago. Pt appears tremulous in triage. Reports hx of seizures with DT's.

## 2023-07-23 NOTE — H&P (Signed)
 History and Physical    Patient: Jermaine [name] ZOX:[id number] DOB: [birth year] DOA: 07/23/2023 DOS: the patient was seen and examined on 07/23/2023 PCP: Patient, No Pcp Per  Patient coming from: Home  Chief Complaint:  Chief Complaint  Patient presents with   Alcohol Problem   HPI: Jermaine [name] is a 58 y.o. male with medical history significant of alcohol abuse, anxiety, depression, class I obesity who was recently admitted from 06/25/2023 until 07/12/2023 due to alcohol withdrawal who left AMA and is now returning to the emergency department with alcohol intoxication and withdrawal symptoms.  98.5 F, pulse 146, respiration 20, BP 138/101 mmHg and O2 sat 97% on room air.  The patient received folic acid 1 mg IVP, thiamine 100 mg IVP, LR 1000 L bolus, phenobarbital 97.2 mg, then 64.8 mg p.o., which was followed by 260 mg of phenobarbital IVPB.  Lab work: CBC showed a white count 9.5, hemoglobin 17.6 g/dL platelets 811.  Magnesium was 1.9 and alcohol level 331 mg/dL.  CMP showed a glucose of 101 and corrected calcium 8.2 mg/dL.  Total protein 8.3 and albumin 4.8 g/dL.  AST 136 and ALT 124 units/L.  Normal alkaline phosphatase and total bilirubin level.   Review of Systems: As mentioned in the history of present illness. All other systems reviewed and are negative.  Past Medical History:  Diagnosis Date   Alcohol abuse    Anxiety    Class 1 obesity 09/14/2021   Depression    Past Surgical History:  Procedure Laterality Date   HERNIA REPAIR     Social History:  reports that he has never smoked. He has never been exposed to tobacco smoke. He has never used smokeless tobacco. He reports current alcohol use of about 20.0 standard drinks of alcohol per week. He reports that he does not currently use drugs.  Allergies  Allergen Reactions   Ketamine     Hx of causing fever in 2024    Codeine Itching    Family History  Adopted: Yes    Prior to Admission medications   Medication Sig  Start Date End Date Taking? Authorizing Provider  folic acid (FOLVITE) 1 MG tablet Take 1 tablet (1 mg total) by mouth daily. Patient not taking: Reported on 05/21/2023 01/28/23   Alberteen Sam, MD  hydrOXYzine (ATARAX) 25 MG tablet Take 50 mg by mouth every 8 (eight) hours as needed for anxiety. 06/10/23   [provider]  mirtazapine (REMERON) 30 MG tablet Take 30 mg by mouth at bedtime. Patient not taking: Reported on 06/25/2023    [provider]  thiamine (VITAMIN B-1) 100 MG tablet Take 1 tablet (100 mg total) by mouth daily. Patient not taking: Reported on 06/25/2023 05/20/22   [name], MD    Physical Exam: Vitals:   07/23/23 1034 07/23/23 1041 07/23/23 1148 07/23/23 1203  BP: (!) 142/115  (!) 133/93 (!) 133/93  Pulse: (!) 150  (!) 121 (!) 118  Resp: (!) 21  18   Temp:  98.8 F (37.1 C)    TempSrc:  Oral    SpO2: 99%  95%    Physical Exam Vitals and nursing note reviewed.  Constitutional:      General: He is awake. He is not in acute distress.    Appearance: Normal appearance. He is ill-appearing.     Interventions: Nasal cannula in place.  HENT:     Head: Normocephalic.     Nose: No rhinorrhea.     Mouth/Throat:  Mouth: Mucous membranes are moist.  Eyes:     General: No scleral icterus.    Pupils: Pupils are equal, round, and reactive to light.  Neck:     Vascular: No JVD.  Cardiovascular:     Rate and Rhythm: Regular rhythm. Tachycardia present.     Heart sounds: S1 normal and S2 normal.  Pulmonary:     Effort: Pulmonary effort is normal.     Breath sounds: Normal breath sounds. No wheezing, rhonchi or rales.  Abdominal:     General: Bowel sounds are normal. There is no distension.     Palpations: Abdomen is soft.     Tenderness: There is no abdominal tenderness. There is no guarding.  Musculoskeletal:     Cervical back: Neck supple.     Right lower leg: No edema.     Left lower leg: No edema.  Skin:    General: Skin is warm  and dry.  Neurological:     General: No focal deficit present.     Mental Status: He is alert and oriented to person, place, and time.  Psychiatric:        Mood and Affect: Mood normal.        Behavior: Behavior normal. Behavior is cooperative.     Data Reviewed:  Results are pending, will review when available. EKG: Vent. rate 149 BPM PR interval 118 ms QRS duration 90 ms QT/QTcB 286/451 ms P-R-T axes 42 114 32 Sinus tachycardia Ventricular premature complex Aberrant complex Right axis deviation  Assessment and Plan: Principal Problem:   Sinus tachycardia In the setting of:   Alcohol withdrawal (HCC)   Alcohol abuse with intoxication (HCC) Without associated:   Transaminitis Admit to stepdown/inpatient. Continue IV fluids. Continue Precedex infusion. Continue CIWA protocol with Ativan. He also received phenobarbital as earlier stated. Folic acid, MVI and thiamine supplementation. Magnesium sulfate 2 g IVPB.  Active Problems:   Anxiety and depression Continue mirtazapine 30 mg p.o. daily. Continue hydroxyzine 50 mg p.o. daily    Class 1 obesity Weight still pending.   Unable to calculate BMI. Lifestyle modifications including EtOH cessation. Follow up with primary care provider.    Secondary polycythemia Does not smoke. Suspect nocturnal hypoxia. Check vitals flowsheet in AM    Advance Care Planning:   Code Status: Full Code   Consults:   Family Communication:   Severity of Illness: The appropriate patient status for this patient is INPATIENT. Inpatient status is judged to be reasonable and necessary in order to provide the required intensity of service to ensure the patient's safety. The patient's presenting symptoms, physical exam findings, and initial radiographic and laboratory data in the context of their chronic comorbidities is felt to place them at high risk for further clinical deterioration. Furthermore, it is not anticipated that the patient  will be medically stable for discharge from the hospital within 2 midnights of admission.   * I certify that at the point of admission it is my clinical judgment that the patient will require inpatient hospital care spanning beyond 2 midnights from the point of admission due to high intensity of service, high risk for further deterioration and high frequency of surveillance required.*  Author: Bobette Mo, MD 07/23/2023 12:43 PM  For on call review [email or web address].   This document was prepared using Dragon voice recognition software and may contain some unintended transcription errors.

## 2023-07-23 NOTE — Progress Notes (Signed)
 [facility] admitting physician addendum:  The patient is very tremulous, anxious and tachycardic in the 120s despite being at 83% Precedex maximum dose. CIWA score at least 14 at the moment. No headache, nausea, auditory or visual hallucinations. Lorazepam 2 mg IVP x 1. I reached out to PCCM. They will monitor closely and official consult to follow.   Sanda Klein, MD

## 2023-07-23 NOTE — ED Provider Triage Note (Signed)
 Emergency Medicine Provider Triage Evaluation Note  Jermaine [name] , a 58 y.o. male  was evaluated in triage.  Pt complains of alcohol intoxication with generalized pain. Reports drinking a fifth of vodka 5 hours ago. Is unable to localize pain (saying "whole body" hurts). Difficulty answering questions. Believes he might have a seizure. Does not take seizure meds. Denies drug use. Says he needs ativan to "put me to sleep."   Denies fevers, chest pain, shortness of breath, abdominal pain, nausea, vomiting.   Review of Systems  Positive: N/a Negative: N/a  Physical Exam  BP (!) 138/101 (BP Location: Right Arm)   Pulse (!) 146   Resp 20   SpO2 97%  Gen:   Awake, no distress   Resp:  Normal effort  MSK:   Moves extremities without difficulty  Other:    Medical Decision Making  Medically screening exam initiated at 7:19 AM.  Appropriate orders placed.  Jermaine [name] was informed that the remainder of the evaluation will be completed by another provider, this initial triage assessment does not replace that evaluation, and the importance of remaining in the ED until their evaluation is complete.     Jermaine [name], [place] 07/23/23 [phone]

## 2023-07-24 DIAGNOSIS — F32A Depression, unspecified: Secondary | ICD-10-CM

## 2023-07-24 DIAGNOSIS — D751 Secondary polycythemia: Secondary | ICD-10-CM | POA: Diagnosis not present

## 2023-07-24 DIAGNOSIS — R7401 Elevation of levels of liver transaminase levels: Secondary | ICD-10-CM

## 2023-07-24 DIAGNOSIS — F419 Anxiety disorder, unspecified: Secondary | ICD-10-CM

## 2023-07-24 DIAGNOSIS — F10939 Alcohol use, unspecified with withdrawal, unspecified: Secondary | ICD-10-CM | POA: Diagnosis not present

## 2023-07-24 LAB — COMPREHENSIVE METABOLIC PANEL
ALT: 105 U/L — ABNORMAL HIGH (ref 0–44)
AST: 118 U/L — ABNORMAL HIGH (ref 15–41)
Albumin: 3.5 g/dL (ref 3.5–5.0)
Alkaline Phosphatase: 43 U/L (ref 38–126)
Anion gap: 9 (ref 5–15)
BUN: 13 mg/dL (ref 6–20)
CO2: 27 mmol/L (ref 22–32)
Calcium: 7.8 mg/dL — ABNORMAL LOW (ref 8.9–10.3)
Chloride: 105 mmol/L (ref 98–111)
Creatinine, Ser: 0.55 mg/dL — ABNORMAL LOW (ref 0.61–1.24)
GFR, Estimated: >60 mL/min (ref >60)
Glucose, Bld: 70 mg/dL (ref 70–99)
Potassium: 4.2 mmol/L (ref 3.5–5.1)
Sodium: 141 mmol/L (ref 135–145)
Total Bilirubin: 1.3 mg/dL — ABNORMAL HIGH (ref 0.0–1.2)
Total Protein: 6 g/dL — ABNORMAL LOW (ref 6.5–8.1)

## 2023-07-24 LAB — CBC
HCT: 44.2 % (ref 39.0–52.0)
Hemoglobin: 14 g/dL (ref 13.0–17.0)
MCH: 31.3 pg (ref 26.0–34.0)
MCHC: 31.7 g/dL (ref 30.0–36.0)
MCV: 98.9 fL (ref 80.0–100.0)
Platelets: 158 10*3/uL (ref 150–400)
RBC: 4.47 MIL/uL (ref 4.22–5.81)
RDW: 14.1 % (ref 11.5–15.5)
WBC: 4.6 10*3/uL (ref 4.0–10.5)
nRBC: 0 % (ref 0.0–0.2)

## 2023-07-24 MED ORDER — HYDRALAZINE HCL 20 MG/ML IJ SOLN: 10.0000 mg | Freq: Four times a day (QID) | INTRAMUSCULAR | Status: DC | PRN

## 2023-07-24 NOTE — Plan of Care (Signed)

## 2023-07-24 NOTE — Progress Notes (Signed)
 Progress Note   Patient: Oluwatomiwa Kinyon WUJ:[id number] DOB: Dec 24, 1965 DOA: 07/23/2023     1 DOS: the patient was seen and examined on 07/24/2023   Brief hospital course:  Leary Mcnulty is a 58 y.o. male with medical history significant of alcohol abuse, anxiety, depression, class I obesity who was recently admitted from 06/25/2023 until 07/12/2023 due to alcohol withdrawal who left AMA and is now returning to the emergency department with alcohol intoxication and withdrawal symptoms.  Assessment and Plan: Alcohol withdrawal Continue gentle IV fluids. He is on Precedex drip which will be tapered off. Continue to monitor vitals closely. Continue CIWA protocol. Tachycardia improved, BP high noted. Start diet, encourage oral fluids. PT/ OT evaluation.  Transaminitis Alcohol induced, LFT trending down.  Alcohol abuse Continue thiamine folate and multivitamin. Advised to quit. TOC to provide substance abuse resources.  Anxiety and depression Continue Mirtazapine, atarax.  Polycythemia Due to dehydration. Improved with IV hydration.      Out of bed to chair. Incentive spirometry. Nursing supportive care. Fall, aspiration precautions. DVT prophylaxis   Code Status: Full Code Subjective: Patient is seen and examined today morning in ICU. He is able to answer me. Denies abdominal pain, nausea. Wishes to eat. Hand tremors noted. BO higher side.  Physical Exam: Vitals:   07/24/23 0800 07/24/23 0900 07/24/23 0950 07/24/23 1000  BP: 134/84 (!) 140/92  (!) 132/91  Pulse: (!) 57 62 70 (!) 54  Resp: (!) 21 (!) 21 17 19   Temp:   97.7 F (36.5 C)   TempSrc:   Axillary   SpO2: 97% 98% 99% 98%  Weight:      Height:        General - Middle aged Caucasian male, no apparent distress HEENT - PERRLA, EOMI, atraumatic head, non tender sinuses. Lung - Clear, diffuse rales, rhonchi, wheezes. Heart - S1, S2 heard, no murmurs, rubs, trace pedal edema. Abdomen - Soft, non tender, bowel  sounds good Neuro - Alert, awake and oriented x 3, non focal exam. Skin - Warm and dry. Hand tremors noted.  Data Reviewed:      Latest Ref Rng & Units 07/24/2023    2:54 AM 07/23/2023    7:57 AM 07/11/2023    4:22 AM  CBC  WBC 4.0 - 10.5 K/uL 4.6  9.5  9.6   Hemoglobin 13.0 - 17.0 g/dL 95.6  21.3  08.6   Hematocrit 39.0 - 52.0 % 44.2  53.5  45.9   Platelets 150 - 400 K/uL 158  303  320       Latest Ref Rng & Units 07/24/2023    2:54 AM 07/23/2023    7:57 AM 07/12/2023    4:17 AM  BMP  Glucose 70 - 99 mg/dL 70  578  91   BUN 6 - 20 mg/dL 13  11  26    Creatinine 0.61 - 1.24 mg/dL 4.69  6.29  5.28   Sodium 135 - 145 mmol/L 141  142  139   Potassium 3.5 - 5.1 mmol/L 4.2  4.2  3.9   Chloride 98 - 111 mmol/L 105  99  94   CO2 22 - 32 mmol/L 27  24  34   Calcium 8.9 - 10.3 mg/dL 7.8  8.8  8.9    No results found.   Family Communication: Discussed with patient, he understand and agree. All questions answereed.  Disposition: Status is: Inpatient Remains inpatient appropriate because: alcohol withdrawal.  Planned Discharge Destination: Home with Home Health  Time spent: 37 minutes  Author: Marcelino Duster, MD 07/24/2023 4:05 PM Secure chat 7am to 7pm For on call review [email or web address].

## 2023-07-25 DIAGNOSIS — E66811 Obesity, class 1: Secondary | ICD-10-CM

## 2023-07-25 DIAGNOSIS — R000 Tachycardia, unspecified: Secondary | ICD-10-CM | POA: Diagnosis not present

## 2023-07-25 DIAGNOSIS — F419 Anxiety disorder, unspecified: Secondary | ICD-10-CM | POA: Diagnosis not present

## 2023-07-25 DIAGNOSIS — F10931 Alcohol use, unspecified with withdrawal delirium: Secondary | ICD-10-CM

## 2023-07-25 MED ORDER — PHENOBARBITAL 32.4 MG PO TABS
32.4000 mg | ORAL_TABLET | Freq: Three times a day (TID) | ORAL | Status: DC
  Administered 2023-07-25 – 2023-07-27 (×6): 32.4 mg via ORAL
  Filled 2023-07-25 (×6): qty 1

## 2023-07-25 MED ORDER — LORAZEPAM 2 MG/ML IJ SOLN: 4.0000 mg | Freq: Once | INTRAMUSCULAR | Status: DC

## 2023-07-25 MED ORDER — LORAZEPAM 2 MG/ML IJ SOLN
1.0000 mg | INTRAMUSCULAR | Status: DC | PRN
  Administered 2023-07-25 (×2): 2 mg via INTRAMUSCULAR
  Filled 2023-07-25: qty 2

## 2023-07-25 MED ORDER — LORAZEPAM 2 MG/ML IJ SOLN
4.0000 mg | Freq: Once | INTRAMUSCULAR | Status: AC
  Administered 2023-07-25: 4 mg via INTRAMUSCULAR
  Filled 2023-07-25: qty 2

## 2023-07-25 MED ORDER — PHENOBARBITAL 32.4 MG PO TABS: 32.4000 mg | ORAL_TABLET | Freq: Three times a day (TID) | ORAL | Status: DC

## 2023-07-25 MED ORDER — PHENOBARBITAL SODIUM 130 MG/ML IJ SOLN: 97.5000 mg | Freq: Two times a day (BID) | INTRAMUSCULAR | Status: DC

## 2023-07-25 MED ORDER — PHENOBARBITAL 32.4 MG PO TABS
97.2000 mg | ORAL_TABLET | Freq: Once | ORAL | Status: AC
Start: 2023-07-25 — End: 2023-07-25
  Administered 2023-07-25: 97.2 mg via ORAL
  Filled 2023-07-25: qty 3

## 2023-07-25 MED ORDER — PHENOBARBITAL 32.4 MG PO TABS: 32.4000 mg | ORAL_TABLET | Freq: Two times a day (BID) | ORAL | Status: DC

## 2023-07-25 NOTE — Progress Notes (Signed)
 OT Cancellation Note  Patient Details Name: Jermaine [name] MRN: [id number] DOB: 1966-05-10   Cancelled Treatment:    Reason Eval/Treat Not Completed: Patient not medically ready. Per RN, pt in 5 point restraints and oriented to self only. RN asks for hold today.   Theodoro Clock 07/25/2023, 9:18 AM

## 2023-07-25 NOTE — Progress Notes (Addendum)
    Patient Name: Jermaine [name]           DOB: Oct 09, 1965  MRN: [id number]      Admission Date: 07/23/2023  Attending Provider: Marcelino Duster, MD  Primary Diagnosis: Alcohol withdrawal [facility])   Level of care: ICU    CROSS COVER NOTE   Date of Service   07/25/2023   Jermaine [name], 58 y.o. male, was admitted on 07/23/2023 for Alcohol withdrawal (HCC).    HPI/Events of Note   Notified of increased EtOH withdrawal symptoms.  Patient has become increasingly restless, agitated, and physically aggressive with staff.  Pt has so far received 22 mg IV Ativan tonight.  Precedex was restarted at 2354 for increase withdrawal symptoms.  Per Dr. Robb Matar note on 2/25, PCCM was made aware of patient.  Day team to follow-up on consult as patient has been on Precedex now for more than 24 hours. Adding phenobarbital dose given withdrawal symptoms and behavior.  He was loaded with phenobarbital on 2/25.  Patient will receive 97.5 mg IV phenobarbital tonight.  Day team to follow-up on phenobarbital taper if needed.  Bedside Assessment:  Patient is awake, A/O to self only, agitated.  Patient noted to be nauseous, tremulous, anxious, agitated, kicking staff.    Respiratory: Bilaterally clear. No accessory muscle use.  Cardiovascular: Tachycardic.  Regular rhythm.      Interventions/ Plan   IV Ativan as needed Continue Precedex IV phenobarbital Restraints due to level of agitation, aggression towards staff members.  Patient placing self and others at home.  Plan is for restraints to be discontinued as soon as possible. Day team to follow-up on Precedex needed and phenobarbital taper       Addendum-  0400- Loss of IV access, IM ativan being given if unable to give oral dose.  Changing IV phenobarbital dose to PO.    Anthoney Harada, DNP, ACNPC- AG [facility]

## 2023-07-25 NOTE — Progress Notes (Signed)
 PT Cancellation Note  Patient Details Name: Jermaine [name] MRN: [id number] DOB: 1965/09/13   Cancelled Treatment:    Reason Eval/Treat Not Completed: Medical issues which prohibited therapy Patient requiring safety restraints and  VS are abnormally high. Will follow. Blanchard Kelch PT Acute Rehabilitation Services Office [phone]    Rada Hay 07/25/2023, 8:39 AM

## 2023-07-25 NOTE — TOC Progression Note (Signed)
 Transition of Care [facility]) - Progression Note    Patient Details  Name: Jermaine [name] MRN: [id number] Date of Birth: 12/19/1965  Transition of Care [facility]) CM/SW Contact  Adrian Prows, RN Phone Number: 07/25/2023, 10:41 AM  Clinical Narrative:    [facility] acknowledges consult for SA counseling/education; pt disoriented; no family at bedside; unable to complete TOC assessment.       Expected Discharge Plan and Services                                               Social Determinants of Health (SDOH) Interventions SDOH Screenings   Food Insecurity: Patient Unable To Answer (07/24/2023)  Housing: Patient Unable To Answer (07/24/2023)  Transportation Needs: Patient Unable To Answer (07/24/2023)  Utilities: Patient Unable To Answer (07/24/2023)  Recent Concern: Utilities - At Risk (05/02/2023)  Tobacco Use: Low Risk  (07/12/2023)   Received from [facility]    Readmission Risk Interventions    05/13/2023    2:46 PM 01/17/2023    4:03 PM 01/14/2023    1:49 PM  Readmission Risk Prevention Plan  Post Dischage Appt   Complete  Medication Screening   Complete  Transportation Screening Complete Complete Complete  PCP or Specialist Appt within 5-7 Days  Complete   PCP or Specialist Appt within 3-5 Days Complete    Home Care Screening  Complete   Medication Review (RN CM)  Complete   HRI or Home Care Consult Complete    Social Work Consult for Recovery Care Planning/Counseling Complete    Palliative Care Screening Not Applicable    Medication Review Oceanographer) Complete

## 2023-07-25 NOTE — Progress Notes (Signed)
 Progress Note   Patient: Jermaine [name] XBJ:[id number] DOB: 05-19-1966 DOA: 07/23/2023     2 DOS: the patient was seen and examined on 07/25/2023   Brief hospital course:  Caliber Landess is a 58 y.o. male with medical history significant of alcohol abuse, anxiety, depression, class I obesity who was recently admitted from 06/25/2023 until 07/12/2023 due to alcohol withdrawal who left AMA and is now returning to the emergency department with alcohol intoxication and withdrawal symptoms.  Assessment and Plan: Alcohol withdrawal with DTs Off Precedex drip. Will continue to taper Phenobarbital got loading dose yesterday. Continue to monitor vitals closely. Continue CIWA protocol. Restraints for safety. Tachycardia improved, BP high noted. Once awake will start diet PT/ OT evaluation.  Transaminitis Alcohol induced, LFT trending down. Repeat labs ordered for tomorrow.  Alcohol abuse Continue thiamine folate and multivitamin. Advised to quit. TOC for substance abuse resources.  Anxiety and depression Continue Mirtazapine, atarax.  Polycythemia Due to dehydration. Improved with IV hydration.      Out of bed to chair. Incentive spirometry. Nursing supportive care. Fall, aspiration precautions. DVT prophylaxis   Code Status: Full Code Subjective: Patient is seen and examined today morning in ICU. He is on 4 point, waist restrains. Last night more agitated. Got phenobarbital loading. He is sleeping comfortably. HR improved  Physical Exam: Vitals:   07/25/23 1400 07/25/23 1500 07/25/23 1547 07/25/23 1700  BP: 95/66 107/66 107/68   Pulse: 66 62 61   Resp: (!) 27 (!) 25 (!) 22   Temp:    (!) 97.4 F (36.3 C)  TempSrc:    Axillary  SpO2: 98% 98% 98%   Weight:      Height:        General - Middle aged Caucasian male, on restraints. HEENT - PERRLA, EOMI, atraumatic head, non tender sinuses. Lung - Clear, diffuse rales, rhonchi, wheezes. Heart - S1, S2 heard, no murmurs,  rubs, trace pedal edema. Abdomen - Soft, non tender, bowel sounds good Neuro - sleeping, 4 point restraints, unable to follow commands. Skin - Warm and dry. Hand tremors noted.  Data Reviewed:      Latest Ref Rng & Units 07/24/2023    2:54 AM 07/23/2023    7:57 AM 07/11/2023    4:22 AM  CBC  WBC 4.0 - 10.5 K/uL 4.6  9.5  9.6   Hemoglobin 13.0 - 17.0 g/dL 30.8  65.7  84.6   Hematocrit 39.0 - 52.0 % 44.2  53.5  45.9   Platelets 150 - 400 K/uL 158  303  320       Latest Ref Rng & Units 07/24/2023    2:54 AM 07/23/2023    7:57 AM 07/12/2023    4:17 AM  BMP  Glucose 70 - 99 mg/dL 70  962  91   BUN 6 - 20 mg/dL 13  11  26    Creatinine 0.61 - 1.24 mg/dL 9.52  8.41  3.24   Sodium 135 - 145 mmol/L 141  142  139   Potassium 3.5 - 5.1 mmol/L 4.2  4.2  3.9   Chloride 98 - 111 mmol/L 105  99  94   CO2 22 - 32 mmol/L 27  24  34   Calcium 8.9 - 10.3 mg/dL 7.8  8.8  8.9    No results found.  Family Communication: Discussed with patient's sister over phone. She understands and agrees. All questions answereed.  Disposition: Status is: Inpatient Remains inpatient appropriate because: alcohol withdrawal.  Planned Discharge  Destination: Home with Home Health     MDM level 3- Patient is at high risk delirium tremens, need close monitoring in SDU. May need precedex drip if phenobarb does not work.   Author: Marcelino Duster, MD 07/25/2023 5:31 PM Secure chat 7am to 7pm For on call review [email or web address].

## 2023-07-26 DIAGNOSIS — D751 Secondary polycythemia: Secondary | ICD-10-CM | POA: Diagnosis not present

## 2023-07-26 DIAGNOSIS — F419 Anxiety disorder, unspecified: Secondary | ICD-10-CM | POA: Diagnosis not present

## 2023-07-26 DIAGNOSIS — F32A Depression, unspecified: Secondary | ICD-10-CM | POA: Diagnosis not present

## 2023-07-26 DIAGNOSIS — F10931 Alcohol use, unspecified with withdrawal delirium: Secondary | ICD-10-CM | POA: Diagnosis not present

## 2023-07-26 LAB — CBC
HCT: 48.3 % (ref 39.0–52.0)
Hemoglobin: 15.8 g/dL (ref 13.0–17.0)
MCH: 31.9 pg (ref 26.0–34.0)
MCHC: 32.7 g/dL (ref 30.0–36.0)
MCV: 97.6 fL (ref 80.0–100.0)
Platelets: 112 10*3/uL — ABNORMAL LOW (ref 150–400)
RBC: 4.95 MIL/uL (ref 4.22–5.81)
RDW: 13.6 % (ref 11.5–15.5)
WBC: 5.5 10*3/uL (ref 4.0–10.5)
nRBC: 0 % (ref 0.0–0.2)

## 2023-07-26 LAB — BASIC METABOLIC PANEL
Anion gap: 12 (ref 5–15)
BUN: 11 mg/dL (ref 6–20)
CO2: 23 mmol/L (ref 22–32)
Calcium: 8.9 mg/dL (ref 8.9–10.3)
Chloride: 104 mmol/L (ref 98–111)
Creatinine, Ser: 0.67 mg/dL (ref 0.61–1.24)
GFR, Estimated: >60 mL/min (ref >60)
Glucose, Bld: 94 mg/dL (ref 70–99)
Potassium: 3.7 mmol/L (ref 3.5–5.1)
Sodium: 139 mmol/L (ref 135–145)

## 2023-07-26 MED ORDER — LORAZEPAM 2 MG/ML IJ SOLN
INTRAMUSCULAR | Status: AC
Start: 2023-07-26 — End: 2023-07-26
  Administered 2023-07-26: 4 mg via INTRAVENOUS
  Filled 2023-07-26: qty 2

## 2023-07-26 MED ORDER — LORAZEPAM 2 MG/ML IJ SOLN
4.0000 mg | Freq: Once | INTRAMUSCULAR | Status: AC
Start: 2023-07-26 — End: 2023-07-26

## 2023-07-26 MED ORDER — SPIRITUS FRUMENTI
1.0000 | Freq: Two times a day (BID) | ORAL | Status: DC
  Administered 2023-07-26 – 2023-07-27 (×3): 1 via ORAL
  Filled 2023-07-26 (×4): qty 1

## 2023-07-26 MED ORDER — FENTANYL CITRATE PF 50 MCG/ML IJ SOSY
50.0000 ug | PREFILLED_SYRINGE | INTRAMUSCULAR | Status: DC | PRN
  Administered 2023-07-26: 50 ug via INTRAVENOUS
  Filled 2023-07-26: qty 1

## 2023-07-26 NOTE — Progress Notes (Signed)
 Unable to assess CIWA score due to lethargy

## 2023-07-26 NOTE — Plan of Care (Signed)
   Problem: Education: Goal: Knowledge of General Education information will improve Description: Including pain rating scale, medication(s)/side effects and non-pharmacologic comfort measures Outcome: Progressing   Problem: Clinical Measurements: Goal: Will remain free from infection Outcome: Progressing

## 2023-07-26 NOTE — TOC Progression Note (Signed)
 Transition of Care [facility]) - Progression Note    Patient Details  Name: Curvin Hunger MRN: [id number] Date of Birth: 11-17-1965  Transition [facility] Care [facility]) CM/SW Contact  Adrian Prows, RN Phone Number: 07/26/2023, 12:13 PM  Clinical Narrative:    Pt confused; no family at bedside; chart review finding pt is homeless; unable to complete TOC assessment; awaiting PT/OT evals.        Expected Discharge Plan and Services                                               Social Determinants of Health (SDOH) Interventions SDOH Screenings   Food Insecurity: Patient Unable To Answer (07/24/2023)  Housing: Patient Unable To Answer (07/24/2023)  Transportation Needs: Patient Unable To Answer (07/24/2023)  Utilities: Patient Unable To Answer (07/24/2023)  Recent Concern: Utilities - At Risk (05/02/2023)  Tobacco Use: Low Risk  (07/12/2023)   Received from [facility]    Readmission Risk Interventions    05/13/2023    2:46 PM 01/17/2023    4:03 PM 01/14/2023    1:49 PM  Readmission Risk Prevention Plan  Post Dischage Appt   Complete  Medication Screening   Complete  Transportation Screening Complete Complete Complete  PCP or Specialist Appt within 5-7 Days  Complete   PCP or Specialist Appt within 3-5 Days Complete    Home Care Screening  Complete   Medication Review (RN CM)  Complete   HRI or Home Care Consult Complete    Social Work Consult for Recovery Care Planning/Counseling Complete    Palliative Care Screening Not Applicable    Medication Review Oceanographer) Complete

## 2023-07-26 NOTE — Progress Notes (Signed)
 PT Cancellation Note  Patient Details Name: Jermaine [name] MRN: [id number] DOB: 1966/03/11   Cancelled Treatment:    Reason Eval/Treat Not Completed: Medical issues which prohibited therapy  On warming blanket, restraints. Will check back 07/29/23. Blanchard Kelch PT Acute Rehabilitation Services Office [phone]   Rada Hay 07/26/2023, 9:24 AM

## 2023-07-26 NOTE — Progress Notes (Signed)
 PROGRESS NOTE  Jermaine [name] ZOX:[id number] DOB: 1965-11-01   PCP: Patient, No Pcp Per  Patient is from: Home  DOA: 07/23/2023 LOS: 3  Chief complaints Chief Complaint  Patient presents with   Alcohol Problem     Brief Narrative / Interim history: 58 year old M with PMH of alcohol abuse, delirium tremens, and recurrent hospitalization for alcohol withdrawal, anxiety and depression recently hospitalized from 1/28-2/14 when he left AMA, returned to ED with alcohol intoxication and withdrawal symptoms, admitted to stepdown unit.  In ED, he was tachycardic and slightly hypertensive.  EtOH level elevated to 331.  He had mildly elevated LFT.  He was admitted to stepdown unit and started on Precedex drip.  Patient remained on Precedex drip along with phenobarbital taper and Ativan.  He also became bradycardic.  Continue to have significant agitation requiring physical restraints as well.  PCCM consulted on 2/28   Subjective: Seen and examined earlier this morning.  Has had 4 point restraints.  Was bradycardic to 40s.  Precedex held.  Significant withdrawal symptoms with CIWA elevated to 27.   Objective: Vitals:   07/26/23 0800 07/26/23 0808 07/26/23 0900 07/26/23 0905  BP: 114/68  (!) 94/58   Pulse: (!) 50  (!) 54   Resp: 19  20   Temp:  (!) 95.9 F (35.5 C)  (!) 96.1 F (35.6 C)  TempSrc:  Axillary  Axillary  SpO2: 99%  99%   Weight:      Height:        Examination:  GENERAL: No apparent distress.  Nontoxic. HEENT: MMM.  Vision and hearing grossly intact.  NECK: Supple.  No apparent JVD.  RESP:  No IWOB.  Fair aeration bilaterally. CVS: Bradycardic to 50s but HR improved to 70s when he woke up.  Heart sounds normal.  ABD/GI/GU: BS+. Abd soft, NTND.  MSK/EXT:  Moves extremities. No apparent deformity.  Bilateral ankle and wrist restraints. SKIN: no apparent skin lesion or wound NEURO: Sleepy but wakes to voice.  Oriented to self, place and day but not months.  No apparent  focal neuro deficit. PSYCH: Calm. Normal affect.   Procedures:  None  Microbiology summarized: MRSA PCR screen nonreactive.  Assessment and plan: Alcohol withdrawal with delirium tremens: Has been on Precedex for 3 days along with Librium taper and Ativan.  Bradycardic this morning.  Precedex held but CIWA elevated to 27.  Also hypothermic. -PCCM consulted.  Will continue Precedex pending PCCM eval. -Continue CIWA and phenobarbital taper -Continue soft restraints for now -Continue thiamine, folic acid and multivitamin -Counseling or resources when able to comprehend -PT/OT eval   Alcoholic transaminitis: Improved -Monitor   Anxiety and depression -Continue Mirtazapine, atarax.   Polycythemia: Likely due to dehydration.  Resolved.  Thrombocytopenia: Mild.  Likely due to alcohol -Continue monitoring  Body mass index is 26.03 kg/m.           DVT prophylaxis:  SCDs Start: 07/23/23 1314  Code Status: Full code Family Communication: None at bedside Level of care: ICU Status is: Inpatient Remains inpatient appropriate because: Alcohol withdrawal with delirium tremens   Final disposition: To be determined Consultants:  PCCM  CRITICAL CARE Performed by: Almon Hercules   Total critical care time: 45 minutes  Critical care time was exclusive of separately billable procedures and treating other patients.  Critical care was necessary to treat or prevent imminent or life-threatening deterioration.  Critical care was time spent personally by me on the following activities: development of treatment plan with  patient and/or surrogate as well as nursing, discussions with consultants, evaluation of patient's response to treatment, examination of patient, obtaining history from patient or surrogate, ordering and performing treatments and interventions, ordering and review of laboratory studies, ordering and review of radiographic studies, pulse oximetry and re-evaluation of  patient's condition.    Sch Meds:  Scheduled Meds:  Chlorhexidine Gluconate Cloth  6 each Topical Daily   folic acid  1 mg Oral Daily   mirtazapine  30 mg Oral QHS   multivitamin with minerals  1 tablet Oral Daily   phenobarbital  32.4 mg Oral TID   Followed by   Melene Muller ON 07/28/2023] phenobarbital  32.4 mg Oral BID   spiritus frumenti  1 each Oral BID   thiamine  100 mg Oral Daily   Or   thiamine  100 mg Intravenous Daily   Continuous Infusions:  dexmedetomidine (PRECEDEX) IV infusion 0.7 mcg/kg/hr (07/26/23 0700)   PRN Meds:.acetaminophen **OR** acetaminophen, hydrALAZINE, hydrOXYzine, ondansetron **OR** ondansetron (ZOFRAN) IV, mouth rinse  Antimicrobials: Anti-infectives (From admission, onward)    None        I have personally reviewed the following labs and images: CBC: Recent Labs  Lab 07/23/23 0757 07/24/23 0254 07/26/23 0311  WBC 9.5 4.6 5.5  NEUTROABS 5.9  --   --   HGB 17.6* 14.0 15.8  HCT 53.5* 44.2 48.3  MCV 95.4 98.9 97.6  PLT 303 158 112*   BMP &GFR Recent Labs  Lab 07/23/23 0757 07/24/23 0254 07/26/23 0311  NA 142 141 139  K 4.2 4.2 3.7  CL 99 105 104  CO2 24 27 23   GLUCOSE 101* 70 94  BUN 11 13 11   CREATININE 1.06 0.55* 0.67  CALCIUM 8.8* 7.8* 8.9  MG 1.9  --   --    Estimated Creatinine Clearance: 105.2 mL/min (by C-G formula based on SCr of 0.67 mg/dL). Liver & Pancreas: Recent Labs  Lab 07/23/23 0757 07/24/23 0254  AST 136* 118*  ALT 124* 105*  ALKPHOS 62 43  BILITOT 0.9 1.3*  PROT 8.3* 6.0*  ALBUMIN 4.8 3.5   No results for input(s): "LIPASE", "AMYLASE" in the last 168 hours. No results for input(s): "AMMONIA" in the last 168 hours. Diabetic: No results for input(s): "HGBA1C" in the last 72 hours. No results for input(s): "GLUCAP" in the last 168 hours. Cardiac Enzymes: No results for input(s): "CKTOTAL", "CKMB", "CKMBINDEX", "TROPONINI" in the last 168 hours. No results for input(s): "PROBNP" in the last 8760  hours. Coagulation Profile: No results for input(s): "INR", "PROTIME" in the last 168 hours. Thyroid Function Tests: No results for input(s): "TSH", "T4TOTAL", "FREET4", "T3FREE", "THYROIDAB" in the last 72 hours. Lipid Profile: No results for input(s): "CHOL", "HDL", "LDLCALC", "TRIG", "CHOLHDL", "LDLDIRECT" in the last 72 hours. Anemia Panel: No results for input(s): "VITAMINB12", "FOLATE", "FERRITIN", "TIBC", "IRON", "RETICCTPCT" in the last 72 hours. Urine analysis:    Component Value Date/Time   COLORURINE YELLOW 05/12/2023 1504   APPEARANCEUR CLEAR 05/12/2023 1504   LABSPEC 1.013 05/12/2023 1504   PHURINE 5.0 05/12/2023 1504   GLUCOSEU NEGATIVE 05/12/2023 1504   HGBUR NEGATIVE 05/12/2023 1504   BILIRUBINUR NEGATIVE 05/12/2023 1504   KETONESUR 20 (A) 05/12/2023 1504   PROTEINUR NEGATIVE 05/12/2023 1504   NITRITE NEGATIVE 05/12/2023 1504   LEUKOCYTESUR NEGATIVE 05/12/2023 1504   Sepsis Labs: Invalid input(s): "PROCALCITONIN", "LACTICIDVEN"  Microbiology: Recent Results (from the past 240 hours)  MRSA Next Gen by PCR, Nasal     Status: None  Collection Time: 07/23/23  6:51 PM   Specimen: Nasal Mucosa; Nasal Swab  Result Value Ref Range Status   MRSA by PCR Next Gen NOT DETECTED NOT DETECTED Final    Comment: (NOTE) The GeneXpert MRSA Assay (FDA approved for NASAL specimens only), is one component of a comprehensive MRSA colonization surveillance program. It is not intended to diagnose MRSA infection nor to guide or monitor treatment for MRSA infections. Test performance is not FDA approved in patients less than 56 years old. Performed at [facility], [address] [place], [place] [address]     Radiology Studies: No results found.    Lilja Soland T. Beaumont [facility]  If 7PM-7AM, please contact night-coverage [email or web address] 07/26/2023, 11:57 AM

## 2023-07-26 NOTE — Consult Note (Signed)
 NAME:  Jermaine [name], MRN:  [id number], DOB:  05/02/1966, LOS: 3 ADMISSION DATE:  07/23/2023, CONSULTATION DATE:  07/26/2023 REFERRING MD:  Dr. Alanda Slim - [facility] , CHIEF COMPLAINT: Alcohol withdrawal  History of Present Illness:  Jermaine [name] is a 58 y.o. with PMH significant for alcohol abuse, anxiety, obesity, and depression who presented to the ED at [facility] 2/25 for complaints of alcohol withdrawal. Of note patient was recently treated at this facility 06/25/2023 to 07/12/2023 for alcohol withdrawal but unfortunately left AMA from that hospitalization. Patient was started on precedex drip and admitted per [facility] for management of alcohol withdrawal.  Early morning 2/25 formal PCCM consult placed given 24hrs of Precedex drip   Pertinent  Medical History  alcohol abuse, anxiety, obesity, and depression   Significant Hospital Events: Including procedures, antibiotic start and stop dates in addition to other pertinent events   2/25 presented with signs of alcohol withdrawal symptoms given inability to "get to" his normal vodka consumption 2/26 Ongoing withdrawal despite, Phenobarbital taper, Precedex drip, and PRN Ativan. Concern for oversedation this am with bradycardia   Interim History / Subjective:  Seen sitting up in bed   Objective   Blood pressure (!) 94/58, pulse (!) 54, temperature (!) 96.1 F (35.6 C), temperature source Axillary, resp. rate 20, height 5\' 10"  (1.778 m), weight 82.3 kg, SpO2 99%.        Intake/Output Summary (Last 24 hours) at 07/26/2023 1043 Last data filed at 07/26/2023 0700 Gross per 24 hour  Intake 276.19 ml  Output 400 ml  Net -123.81 ml   Filed Weights   07/23/23 1848  Weight: 82.3 kg    Examination: General: Acute on chronically ill appearing middle aged male sitting up in bed, in NAD HEENT: [place]/AT, MM pink/moist, PERRL,  Neuro: Alert and oriented x3, slightly trimerous  CV: s1s2 regular rate and rhythm, no murmur, rubs, or gallops,  PULM:  Clear to  auscultation, no increased work of breathing, no added breath sounds GI: soft, bowel sounds active in all 4 quadrants, non-tender, non-distended, tolerating oral diet  Extremities: warm/dry, no edema  Skin: no rashes or lesions  Resolved Hospital Problem list     Assessment & Plan:  Alcohol withdrawal with history of withdrawal seizures  -This is a reoccurring problem for patient, he states he was unable to "get to" his typical alcohol consumption this admission resulting in withdrawal symptoms that prompted him to present to the ED. He states he has no desire to stop drinking -His most recent hospitalization was 06/25/2023 to 07/12/2023, unfortunately left AMA from that hospitalization. On review of that stay it appears patient required multiple medications to try and control withdrawal symptoms  P: In an attempt to avoid polypharmacy and oversedation will order inpatient alcohol consumption scheduled BID  If benefit not seen low threshold to stop inpatient alcohol use  Continue low dose Phenobarbital taper with low threshold to stop if inpatient alcohol use curbs withdrawal symptoms  Can use low dose Precdex drip if additional agent is needed  Stop scheduled and PRN Ativan use  Monitor CIWA score  Continuous telemetry  Supplement thiamine, folate, and multivitamin   Transaminitis - improving  P: Avoid hepatotoxins  IV hydration  Cessation education provided but patient declined   Anxiety and depression  P: Continue home Atarax and Remeron   Best Practice (right click and "Reselect all SmartList Selections" daily)   Diet/type: Regular consistency (see orders) DVT prophylaxis SCD Pressure ulcer(s): N/A GI prophylaxis: PPI Lines: N/A  Foley:  N/A Code Status:  full code Last date of multidisciplinary goals of care discussion: Continue to update patient daily   Labs   CBC: Recent Labs  Lab 07/23/23 0757 07/24/23 0254 07/26/23 0311  WBC 9.5 4.6 5.5  NEUTROABS 5.9  --   --    HGB 17.6* 14.0 15.8  HCT 53.5* 44.2 48.3  MCV 95.4 98.9 97.6  PLT 303 158 112*    Basic Metabolic Panel: Recent Labs  Lab 07/23/23 0757 07/24/23 0254 07/26/23 0311  NA 142 141 139  K 4.2 4.2 3.7  CL 99 105 104  CO2 24 27 23   GLUCOSE 101* 70 94  BUN 11 13 11   CREATININE 1.06 0.55* 0.67  CALCIUM 8.8* 7.8* 8.9  MG 1.9  --   --    GFR: Estimated Creatinine Clearance: 105.2 mL/min (by C-G formula based on SCr of 0.67 mg/dL). Recent Labs  Lab 07/23/23 0757 07/24/23 0254 07/26/23 0311  WBC 9.5 4.6 5.5    Liver Function Tests: Recent Labs  Lab 07/23/23 0757 07/24/23 0254  AST 136* 118*  ALT 124* 105*  ALKPHOS 62 43  BILITOT 0.9 1.3*  PROT 8.3* 6.0*  ALBUMIN 4.8 3.5   No results for input(s): "LIPASE", "AMYLASE" in the last 168 hours. No results for input(s): "AMMONIA" in the last 168 hours.  ABG    Component Value Date/Time   PHART 7.4 01/18/2023 0950   PCO2ART 38 01/18/2023 0950   PO2ART 96 01/18/2023 0950   HCO3 23.5 01/18/2023 0950   ACIDBASEDEF 1.1 01/18/2023 0950   O2SAT 99.5 01/18/2023 0950     Coagulation Profile: No results for input(s): "INR", "PROTIME" in the last 168 hours.  Cardiac Enzymes: No results for input(s): "CKTOTAL", "CKMB", "CKMBINDEX", "TROPONINI" in the last 168 hours.  HbA1C: Hgb A1c MFr Bld  Date/Time Value Ref Range Status  01/16/2023 02:57 AM 5.4 4.8 - 5.6 % Final    Comment:    (NOTE) Pre diabetes:          5.7%-6.4%  Diabetes:              >6.4%  Glycemic control for   <7.0% adults with diabetes   03/20/2019 03:46 AM 5.4 4.8 - 5.6 % Final    Comment:    (NOTE) Pre diabetes:          5.7%-6.4% Diabetes:              >6.4% Glycemic control for   <7.0% adults with diabetes     CBG: No results for input(s): "GLUCAP" in the last 168 hours.  Review of Systems:   Please see the history of present illness. All other systems reviewed and are negative   Past Medical History:  He,  has a past medical history  of Alcohol abuse, Anxiety, Class 1 obesity (09/14/2021), and Depression.   Surgical History:   Past Surgical History:  Procedure Laterality Date   HERNIA REPAIR       Social History:   reports that he has never smoked. He has never been exposed to tobacco smoke. He has never used smokeless tobacco. He reports current alcohol use of about 20.0 standard drinks of alcohol per week. He reports that he does not currently use drugs.   Family History:  His family history is not on file. He was adopted.   Allergies Allergies  Allergen Reactions   Ketamine     Hx of causing fever in 2024    Codeine Itching  Home Medications  Prior to Admission medications   Medication Sig Start Date End Date Taking? Authorizing Provider  folic acid (FOLVITE) 1 MG tablet Take 1 tablet (1 mg total) by mouth daily. 01/28/23  Yes Danford, Earl Lites, MD  hydrOXYzine (ATARAX) 25 MG tablet Take 50 mg by mouth every 8 (eight) hours as needed for anxiety. 06/10/23  Yes [provider]  mirtazapine (REMERON) 30 MG tablet Take 30 mg by mouth at bedtime.   Yes [provider]  thiamine (VITAMIN B-1) 100 MG tablet Take 1 tablet (100 mg total) by mouth daily. 05/20/22  Yes [name], MD     Critical care time:   CRITICAL CARE Performed by: Chayanne Filippi D. Harris  Total critical care time: 38 minutes  Critical care time was exclusive of separately billable procedures and treating other patients.  Critical care was necessary to treat or prevent imminent or life-threatening deterioration.  Critical care was time spent personally by me on the following activities: development of treatment plan with patient and/or surrogate as well as nursing, discussions with consultants, evaluation of patient's response to treatment, examination of patient, obtaining history from patient or surrogate, ordering and performing treatments and interventions, ordering and review of laboratory studies, ordering and  review of radiographic studies, pulse oximetry and re-evaluation of patient's condition.  Uvaldo Rybacki D. Harris, NP-C [facility] Personal contact information can be found on Amion  If no contact or response made please call [phone] 07/26/2023, 11:55 AM

## 2023-07-26 NOTE — Plan of Care (Signed)

## 2023-07-26 NOTE — Progress Notes (Signed)
 OT Cancellation Note  Patient Details Name: Jermaine [name] MRN: [id number] DOB: [birth year]   Cancelled Treatment:    Reason Eval/Treat Not Completed: Patient not medically ready. RN advises hold on PT/OT today. Pt still unstable with bear hugger in place.   Theodoro Clock 07/26/2023, 9:06 AM

## 2023-07-27 DIAGNOSIS — D751 Secondary polycythemia: Secondary | ICD-10-CM

## 2023-07-27 DIAGNOSIS — R7401 Elevation of levels of liver transaminase levels: Secondary | ICD-10-CM

## 2023-07-27 DIAGNOSIS — R000 Tachycardia, unspecified: Secondary | ICD-10-CM | POA: Diagnosis not present

## 2023-07-27 DIAGNOSIS — E66811 Obesity, class 1: Secondary | ICD-10-CM

## 2023-07-27 DIAGNOSIS — F10931 Alcohol use, unspecified with withdrawal delirium: Secondary | ICD-10-CM | POA: Diagnosis not present

## 2023-07-27 DIAGNOSIS — F419 Anxiety disorder, unspecified: Secondary | ICD-10-CM

## 2023-07-27 DIAGNOSIS — F32A Depression, unspecified: Secondary | ICD-10-CM

## 2023-07-27 LAB — COMPREHENSIVE METABOLIC PANEL
ALT: 164 U/L — ABNORMAL HIGH (ref 0–44)
AST: 126 U/L — ABNORMAL HIGH (ref 15–41)
Albumin: 3.3 g/dL — ABNORMAL LOW (ref 3.5–5.0)
Alkaline Phosphatase: 62 U/L (ref 38–126)
Anion gap: 11 (ref 5–15)
BUN: 15 mg/dL (ref 6–20)
CO2: 25 mmol/L (ref 22–32)
Calcium: 8.5 mg/dL — ABNORMAL LOW (ref 8.9–10.3)
Chloride: 101 mmol/L (ref 98–111)
Creatinine, Ser: 0.53 mg/dL — ABNORMAL LOW (ref 0.61–1.24)
GFR, Estimated: >60 mL/min (ref >60)
Glucose, Bld: 91 mg/dL (ref 70–99)
Potassium: 3.5 mmol/L (ref 3.5–5.1)
Sodium: 137 mmol/L (ref 135–145)
Total Bilirubin: 1 mg/dL (ref 0.0–1.2)
Total Protein: 5.7 g/dL — ABNORMAL LOW (ref 6.5–8.1)

## 2023-07-27 LAB — CBC
HCT: 45.6 % (ref 39.0–52.0)
Hemoglobin: 15 g/dL (ref 13.0–17.0)
MCH: 32.3 pg (ref 26.0–34.0)
MCHC: 32.9 g/dL (ref 30.0–36.0)
MCV: 98.1 fL (ref 80.0–100.0)
Platelets: 116 10*3/uL — ABNORMAL LOW (ref 150–400)
RBC: 4.65 MIL/uL (ref 4.22–5.81)
RDW: 13.5 % (ref 11.5–15.5)
WBC: 6.6 10*3/uL (ref 4.0–10.5)
nRBC: 0 % (ref 0.0–0.2)

## 2023-07-27 LAB — MAGNESIUM: Magnesium: 1.9 mg/dL (ref 1.7–2.4)

## 2023-07-27 LAB — PHOSPHORUS: Phosphorus: 3.4 mg/dL (ref 2.5–4.6)

## 2023-07-27 LAB — CK: Total CK: 530 U/L — ABNORMAL HIGH (ref 49–397)

## 2023-07-27 MED ORDER — MAGNESIUM SULFATE 2 GM/50ML IV SOLN
2.0000 g | Freq: Once | INTRAVENOUS | Status: AC
  Administered 2023-07-27: 2 g via INTRAVENOUS
  Filled 2023-07-27: qty 50

## 2023-07-27 MED ORDER — LACTATED RINGERS IV SOLN: Freq: Once | INTRAVENOUS | Status: AC

## 2023-07-27 NOTE — Progress Notes (Signed)
 Pt expressed to this RN he was going to leave AMA.  RN advised pt of the continuing care he was receiving as well as the dangers of leaving against medical advice.  Pt stated he understood and still chose to leave.  MD made aware.  RN removed IV, pt dressed himself and left unit without assistance from staff.    Ulis Rias, RN 07/27/2023 4:54 PM

## 2023-07-27 NOTE — Progress Notes (Signed)
 NAME:  Jermaine [name], MRN:  [id number], DOB:  June 07, 1965, LOS: 4 ADMISSION DATE:  07/23/2023, CONSULTATION DATE:  07/26/2023 REFERRING MD:  Dr. Alanda Slim - [facility] , CHIEF COMPLAINT: Alcohol withdrawal  History of Present Illness:  Jermaine [name] is a 58 y.o. with PMH significant for alcohol abuse, anxiety, obesity, and depression who presented to the ED at [facility] 2/25 for complaints of alcohol withdrawal. Of note patient was recently treated at this facility 06/25/2023 to 07/12/2023 for alcohol withdrawal but unfortunately left AMA from that hospitalization. Patient was started on precedex drip and admitted per [facility] for management of alcohol withdrawal.  Early morning 2/25 formal PCCM consult placed given 24hrs of Precedex drip   Pertinent  Medical History  alcohol abuse, anxiety, obesity, and depression   Significant Hospital Events: Including procedures, antibiotic start and stop dates in addition to other pertinent events   2/25 presented with signs of alcohol withdrawal symptoms given inability to "get to" his normal vodka consumption 2/26 Ongoing withdrawal despite, Phenobarbital taper, Precedex drip, and PRN Ativan. Concern for oversedation this am with bradycardia  2/28 - ccm consult  Interim History / Subjective:    3/1 getting pharmaceutial bid vodka daily (baseline 4 shots as soon as he gets up).  On precedex, phenobarb, atarax, fent prn. During lucidity says he will sign out AMA  Objective   Blood pressure (!) 132/56, pulse 63, temperature 97.8 F (36.6 C), temperature source Oral, resp. rate 14, height 5\' 10"  (1.778 m), weight 82.3 kg, SpO2 100%.        Intake/Output Summary (Last 24 hours) at 07/27/2023 1329 Last data filed at 07/27/2023 1000 Gross per 24 hour  Intake 204.04 ml  Output --  Net 204.04 ml   Filed Weights   07/23/23 1848  Weight: 82.3 kg    Examination: General Appearance:   wearing baseball hat Head:  Normocephalic, without obvious abnormality, atraumatic Eyes:   PERRL - yes, conjunctiva/corneas - muddu     Ears:  Normal external ear canals, both ears Nose:  G tube - NO bu thas o2 Throat:  ETT TUBE - no , OG tube - no Neck:  Supple,  No enlargement/tenderness/nodules Lungs: Clear to auscultation bilaterally,  Heart:  S1 and S2 normal, no murmur, CVP - no.  Pressors - no Abdomen:  Soft, no masses, no organomegaly Genitalia / Rectal:  Not done Extremities:  Extremities- ingact Skin:  ntact in exposed areas . Sacral area - x Neurologic:  Sedation - precededx , phenobarb -> RASS - -2 . Moves all 4s - yes. CAM-ICU - ? . Orientation - did respond to call out     Resolved Hospital Problem list     Assessment & Plan:  Alcohol withdrawal with history of withdrawal seizures  -This is a reoccurring problem for patient, he states he was unable to "get to" his typical alcohol consumption this admission resulting in withdrawal symptoms that prompted him to present to the ED. He states he has no desire to stop drinking -His most recent hospitalization was 06/25/2023 to 07/12/2023, unfortunately left AMA from that hospitalization. On review of that stay it appears patient required multiple medications to try and control withdrawal symptoms   3/1 - waxing and waning  P: alcohol consumption scheduled BID  Continue low dose Phenobarbital taper with low threshold to stop if inpatient alcohol use curbs withdrawal symptoms  Can use low dose Precdex drip if additional agent is needed  Stop scheduled and PRN Ativan use  Monitor CIWA  score  Continuous telemetry  Supplement thiamine, folate, and multivitamin   Transaminitis - improving  P: Avoid hepatotoxins  IV hydration  Cessation education provided but patient declined   Anxiety and depression  P: Continue home Atarax and Remeron   Best Practice (right click and "Reselect all SmartList Selections" daily)   Per [facility]   ATTESTATION & SIGNATURE    Dr. Kalman Shan, M.D., [facility].C.P Pulmonary and  Critical Care Medicine Staff Physician, [facility] [place] [facility] Pager: [phone], If no answer or between  15:00h - 7:00h: call [phone]  07/27/2023 1:29 PM    LABS    PULMONARY No results for input(s): "PHART", "PCO2ART", "PO2ART", "HCO3", "TCO2", "O2SAT" in the last 168 hours.  Invalid input(s): "PCO2", "PO2"  CBC Recent Labs  Lab 07/24/23 0254 07/26/23 0311 07/27/23 0310  HGB 14.0 15.8 15.0  HCT 44.2 48.3 45.6  WBC 4.6 5.5 6.6  PLT 158 112* 116*    COAGULATION No results for input(s): "INR" in the last 168 hours.  CARDIAC  No results for input(s): "TROPONINI" in the last 168 hours. No results for input(s): "PROBNP" in the last 168 hours.   CHEMISTRY Recent Labs  Lab 07/23/23 0757 07/24/23 0254 07/26/23 0311 07/27/23 0310  NA 142 141 139 137  K 4.2 4.2 3.7 3.5  CL 99 105 104 101  CO2 24 27 23 25   GLUCOSE 101* 70 94 91  BUN 11 13 11 15   CREATININE 1.06 0.55* 0.67 0.53*  CALCIUM 8.8* 7.8* 8.9 8.5*  MG 1.9  --   --  1.9  PHOS  --   --   --  3.4   Estimated Creatinine Clearance: 105.2 mL/min (A) (by C-G formula based on SCr of 0.53 mg/dL (L)).   LIVER Recent Labs  Lab 07/23/23 0757 07/24/23 0254 07/27/23 0310  AST 136* 118* 126*  ALT 124* 105* 164*  ALKPHOS 62 43 62  BILITOT 0.9 1.3* 1.0  PROT 8.3* 6.0* 5.7*  ALBUMIN 4.8 3.5 3.3*     INFECTIOUS No results for input(s): "LATICACIDVEN", "PROCALCITON" in the last 168 hours.   ENDOCRINE CBG (last 3)  No results for input(s): "GLUCAP" in the last 72 hours.       IMAGING x48h  - image(s) personally visualized  -   highlighted in bold No results found.

## 2023-07-27 NOTE — Progress Notes (Signed)
 eLink Physician-Brief Progress Note Patient Name: Jermaine [name] DOB: 1965-09-23 MRN: [id number]   Date of Service  07/27/2023  HPI/Events of Note    eICU Interventions     ETOH withdrawal Hypotension  Will give IVF bolus     Massie Maroon 07/27/2023, 1:21 AM

## 2023-07-27 NOTE — Discharge Summary (Signed)
 Physician Discharge Summary  Jermaine [name] LKG:[id number] DOB: 1966-05-20 DOA: 07/23/2023  PCP: Patient, No Pcp Per  Admit date: 07/23/2023 Discharge date: 07/27/23  Admitted From: Home. Disposition: Left AGAINST MEDICAL ADVICE    Hospital course 58 year old M with PMH of alcohol abuse, delirium tremens, and recurrent hospitalization for alcohol withdrawal, anxiety and depression recently hospitalized from 1/28-2/14 when he left AMA, returned to ED with alcohol intoxication and withdrawal symptoms, admitted to stepdown unit.  Patient reports drinking a bottle of vodka every day.     In ED, he was tachycardic and slightly hypertensive.  EtOH level elevated to 331.  He had mildly elevated LFT.  He was admitted to stepdown unit and started on Precedex drip.  Patient remained on Precedex drip along with phenobarbital taper and Ativan.  He also became bradycardic.  Continue to have significant agitation requiring physical restraints as well.  PCCM consulted on 07/26/2023.  Patient was continued on on Precedex and phenobarbital taper.  Ativan discontinued by PCCM.    On 07/27/2023, withdrawal symptoms improved.  His last 2 CIWA scores were low at 6 and 4 respectively.  Unfortunately, patient decided to leave AGAINST MEDICAL ADVICE later in the day.  Patient has no desire to stop drinking.  He is awake and alert and oriented x 4 and has capacity to make medical decision.  He has done this before.   See individual problem list below for more.   Problems addressed during this hospitalization Principal Problem:   Alcohol withdrawal (HCC) Active Problems:   DTs (delirium tremens) (HCC)   Alcohol abuse with intoxication (HCC)   Anxiety and depression   Sinus tachycardia   Class 1 obesity   Secondary polycythemia   Transaminitis              Time spent 35 minutes  Vital signs Vitals:   07/27/23 1400 07/27/23 1500 07/27/23 1600 07/27/23 1631  BP: 122/73 (!) 95/56 114/76   Pulse: 65 77  70   Temp:    98.3 F (36.8 C)  Resp: 11 (!) 23 12   Height:      Weight:      SpO2: (!) 88% (!) 88% 91%   TempSrc:    Oral  BMI (Calculated):         Discharge exam  GENERAL: No apparent distress.  Nontoxic. HEENT: MMM.  Vision and hearing grossly intact.  NECK: Supple.  No apparent JVD.  RESP:  No IWOB.  Fair aeration bilaterally. CVS:  RRR. Heart sounds normal.  ABD/GI/GU: BS+. Abd soft, NTND.  MSK/EXT:  Moves extremities. No apparent deformity. No edema.  SKIN: no apparent skin lesion or wound NEURO: Awake and alert. Oriented appropriately.  No apparent focal neuro deficit. PSYCH: Calm. Normal affect.   Discharge Instructions  Allergies as of 07/27/2023       Reactions   Ketamine    Hx of causing fever in 2024    Codeine Itching     Med Rec must be completed prior to using this [facility]       Consultations: PCCM  Procedures/Studies:   DG Abd 1 View Result Date: 07/01/2023 CLINICAL DATA:  Nasogastric tube placement EXAM: ABDOMEN - 1 VIEW COMPARISON:  None Available. FINDINGS: Feeding tube tip in the mid to distal stomach. No nasogastric tube seen. Paucity of intestinal gas. Unremarkable bones. IMPRESSION: 1. Feeding tube tip in the mid to distal stomach. 2. No nasogastric tube seen. Electronically Signed   By: Zada Finders.D.  On: 07/01/2023 14:28   DG Chest Port 1 View Result Date: 07/01/2023 CLINICAL DATA:  Hypoxia. EXAM: PORTABLE CHEST 1 VIEW COMPARISON:  06/26/2023 FINDINGS: Improved inspiration. Stable borderline cardiomegaly and tortuous aorta. Interval minimal right basilar linear atelectasis. Otherwise, clear lungs with normal vascularity. Feeding tube tip in the mid to distal stomach. The included bowel-gas pattern is unremarkable. Unremarkable bones IMPRESSION: 1. Improved inspiration with interval minimal right basilar linear atelectasis. 2. Stable borderline cardiomegaly. 3. Feeding tube tip in the mid to distal stomach. Electronically Signed   By:  Beckie Salts M.D.   On: 07/01/2023 14:26       The results of significant diagnostics from this hospitalization (including imaging, microbiology, ancillary and laboratory) are listed below for reference.     Microbiology: Recent Results (from the past 240 hours)  MRSA Next Gen by PCR, Nasal     Status: None   Collection Time: 07/23/23  6:51 PM   Specimen: Nasal Mucosa; Nasal Swab  Result Value Ref Range Status   MRSA by PCR Next Gen NOT DETECTED NOT DETECTED Final    Comment: (NOTE) The GeneXpert MRSA Assay (FDA approved for NASAL specimens only), is one component of a comprehensive MRSA colonization surveillance program. It is not intended to diagnose MRSA infection nor to guide or monitor treatment for MRSA infections. Test performance is not FDA approved in patients less than 81 years old. Performed at [facility], [address] [place], [place] [address]      Labs:  CBC: Recent Labs  Lab 07/23/23 [phone] 07/24/23 0254 07/26/23 0311 07/27/23 0310  WBC 9.5 4.6 5.5 6.6  NEUTROABS 5.9  --   --   --   HGB 17.6* 14.0 15.8 15.0  HCT 53.5* 44.2 48.3 45.6  MCV 95.4 98.9 97.6 98.1  PLT 303 158 112* 116*   BMP &GFR Recent Labs  Lab 07/23/23 0757 07/24/23 0254 07/26/23 0311 07/27/23 0310  NA 142 141 139 137  K 4.2 4.2 3.7 3.5  CL 99 105 104 101  CO2 24 27 23 25   GLUCOSE 101* 70 94 91  BUN 11 13 11 15   CREATININE 1.06 0.55* 0.67 0.53*  CALCIUM 8.8* 7.8* 8.9 8.5*  MG 1.9  --   --  1.9  PHOS  --   --   --  3.4   Estimated Creatinine Clearance: 105.2 mL/min (A) (by C-G formula based on SCr of 0.53 mg/dL (L)). Liver & Pancreas: Recent Labs  Lab 07/23/23 0757 07/24/23 0254 07/27/23 0310  AST 136* 118* 126*  ALT 124* 105* 164*  ALKPHOS 62 43 62  BILITOT 0.9 1.3* 1.0  PROT 8.3* 6.0* 5.7*  ALBUMIN 4.8 3.5 3.3*   No results for input(s): "LIPASE", "AMYLASE" in the last 168 hours. No results for input(s): "AMMONIA" in the last 168  hours. Diabetic: No results for input(s): "HGBA1C" in the last 72 hours. No results for input(s): "GLUCAP" in the last 168 hours. Cardiac Enzymes: Recent Labs  Lab 07/27/23 0310  CKTOTAL 530*   No results for input(s): "PROBNP" in the last 8760 hours. Coagulation Profile: No results for input(s): "INR", "PROTIME" in the last 168 hours. Thyroid Function Tests: No results for input(s): "TSH", "T4TOTAL", "FREET4", "T3FREE", "THYROIDAB" in the last 72 hours. Lipid Profile: No results for input(s): "CHOL", "HDL", "LDLCALC", "TRIG", "CHOLHDL", "LDLDIRECT" in the last 72 hours. Anemia Panel: No results for input(s): "VITAMINB12", "FOLATE", "FERRITIN", "TIBC", "IRON", "RETICCTPCT" in the last 72 hours. Urine analysis:  Component Value Date/Time   COLORURINE YELLOW 05/12/2023 1504   APPEARANCEUR CLEAR 05/12/2023 1504   LABSPEC 1.013 05/12/2023 1504   PHURINE 5.0 05/12/2023 1504   GLUCOSEU NEGATIVE 05/12/2023 1504   HGBUR NEGATIVE 05/12/2023 1504   BILIRUBINUR NEGATIVE 05/12/2023 1504   KETONESUR 20 (A) 05/12/2023 1504   PROTEINUR NEGATIVE 05/12/2023 1504   NITRITE NEGATIVE 05/12/2023 1504   LEUKOCYTESUR NEGATIVE 05/12/2023 1504   Sepsis Labs: Invalid input(s): "PROCALCITONIN", "LACTICIDVEN"   SIGNED:  Almon Hercules, MD  [facility] 07/27/2023, 5:45 PM

## 2023-07-27 NOTE — Progress Notes (Signed)
 PROGRESS NOTE  Jermaine [name] JYN:[id number] DOB: 1965-11-05   PCP: Patient, No Pcp Per  Patient is from: Home  DOA: 07/23/2023 LOS: 4  Chief complaints Chief Complaint  Patient presents with   Alcohol Problem     Brief Narrative / Interim history: 58 year old M with PMH of alcohol abuse, delirium tremens, and recurrent hospitalization for alcohol withdrawal, anxiety and depression recently hospitalized from 1/28-2/14 when he left AMA, returned to ED with alcohol intoxication and withdrawal symptoms, admitted to stepdown unit.  Patient reports drinking a bottle of vodka every day.    In ED, he was tachycardic and slightly hypertensive.  EtOH level elevated to 331.  He had mildly elevated LFT.  He was admitted to stepdown unit and started on Precedex drip.  Patient remained on Precedex drip along with phenobarbital taper and Ativan.  He also became bradycardic.  Continue to have significant agitation requiring physical restraints as well.  PCCM consulted on 2/28.  Remains on Precedex and phenobarbital taper.  Ativan discontinued by PCCM.  Seems to be improving.   Subjective: Seen and examined earlier this morning.  No major events overnight of this morning.  He reports generalized pain.  Last 2 CIWA scores are 6 and 4.  He is sleepy but wakes to voice.  He is oriented x 4.  He reports drinking a bottle of vodka every day.  Objective: Vitals:   07/27/23 0300 07/27/23 0400 07/27/23 0500 07/27/23 0833  BP: (!) 126/58 (!) 117/57 (!) 132/56   Pulse: (!) 57 62 63   Resp: (!) 21 18 14    Temp:  98.1 F (36.7 C)  (!) 97.5 F (36.4 C)  TempSrc:  Oral  Oral  SpO2: 99% 100% 100%   Weight:      Height:        Examination:  GENERAL: No apparent distress.  Nontoxic. HEENT: MMM.  Vision and hearing grossly intact.  NECK: Supple.  No apparent JVD.  RESP:  No IWOB.  Fair aeration bilaterally. CVS:  RRR. Heart sounds normal.  ABD/GI/GU: BS+. Abd soft, NTND.  MSK/EXT:   No apparent  deformity. Moves extremities. No edema.  SKIN: no apparent skin lesion or wound NEURO: Sleepy but wakes to voice.  Oriented x 4.  No apparent focal neuro deficit. PSYCH: Calm. Normal affect.   Procedures:  None  Microbiology summarized: MRSA PCR screen nonreactive.  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  He became bradycardic with elevated CO2 27 on 2/28.  PCCM consulted, and continued Precedex and phenobarbital taper but discontinued Ativan.  Withdrawal symptoms seems to have improved.  Last 2 CIWA scores are 6 and 4.  He is currently oriented x 4 without major withdrawal symptoms on exam. -Continue weaning off Precedex -Continue phenobarbital taper. -Continue CIWA monitoring -Continue thiamine, folic acid and multivitamin -Counseling or resources when able to comprehend -PT/OT eval   Alcoholic transaminitis: Slightly trended up today.  CK slightly elevated to 530. -Encourage oral hydration   Anxiety and depression: Stable -Continue Mirtazapine, atarax.   Polycythemia: Likely due to dehydration.  Resolved.  Thrombocytopenia: Mild.  Likely due to alcohol -Continue monitoring  Body mass index is 26.03 kg/m.           DVT prophylaxis:  SCDs Start: 07/23/23 1314  Code Status: Full code Family Communication: None at bedside Level of care: ICU Status is: Inpatient Remains inpatient appropriate because: Alcohol withdrawal with  delirium tremens   Final disposition: To be determined Consultants:  PCCM  CRITICAL CARE Performed by: Almon Hercules   Total critical care time: 45 minutes  Critical care time was exclusive of separately billable procedures and treating other patients.  Critical care was necessary to treat or prevent imminent or life-threatening deterioration.  Critical care was time spent personally by me on the following activities:  development of treatment plan with patient and/or surrogate as well as nursing, discussions with consultants, evaluation of patient's response to treatment, examination of patient, obtaining history from patient or surrogate, ordering and performing treatments and interventions, ordering and review of laboratory studies, ordering and review of radiographic studies, pulse oximetry and re-evaluation of patient's condition.    Sch Meds:  Scheduled Meds:  Chlorhexidine Gluconate Cloth  6 each Topical Daily   folic acid  1 mg Oral Daily   mirtazapine  30 mg Oral QHS   multivitamin with minerals  1 tablet Oral Daily   phenobarbital  32.4 mg Oral TID   Followed by   Melene Muller ON 07/28/2023] phenobarbital  32.4 mg Oral BID   spiritus frumenti  1 each Oral BID   thiamine  100 mg Oral Daily   Or   thiamine  100 mg Intravenous Daily   Continuous Infusions:  dexmedetomidine (PRECEDEX) IV infusion 0.2 mcg/kg/hr (07/27/23 1000)   PRN Meds:.acetaminophen **OR** acetaminophen, fentaNYL (SUBLIMAZE) injection, hydrALAZINE, hydrOXYzine, ondansetron **OR** ondansetron (ZOFRAN) IV, mouth rinse  Antimicrobials: Anti-infectives (From admission, onward)    None        I have personally reviewed the following labs and images: CBC: Recent Labs  Lab 07/23/23 0757 07/24/23 0254 07/26/23 0311 07/27/23 0310  WBC 9.5 4.6 5.5 6.6  NEUTROABS 5.9  --   --   --   HGB 17.6* 14.0 15.8 15.0  HCT 53.5* 44.2 48.3 45.6  MCV 95.4 98.9 97.6 98.1  PLT 303 158 112* 116*   BMP &GFR Recent Labs  Lab 07/23/23 0757 07/24/23 0254 07/26/23 0311 07/27/23 0310  NA 142 141 139 137  K 4.2 4.2 3.7 3.5  CL 99 105 104 101  CO2 24 27 23 25   GLUCOSE 101* 70 94 91  BUN 11 13 11 15   CREATININE 1.06 0.55* 0.67 0.53*  CALCIUM 8.8* 7.8* 8.9 8.5*  MG 1.9  --   --  1.9  PHOS  --   --   --  3.4   Estimated Creatinine Clearance: 105.2 mL/min (A) (by C-G formula based on SCr of 0.53 mg/dL (L)). Liver & Pancreas: Recent Labs   Lab 07/23/23 0757 07/24/23 0254 07/27/23 0310  AST 136* 118* 126*  ALT 124* 105* 164*  ALKPHOS 62 43 62  BILITOT 0.9 1.3* 1.0  PROT 8.3* 6.0* 5.7*  ALBUMIN 4.8 3.5 3.3*   No results for input(s): "LIPASE", "AMYLASE" in the last 168 hours. No results for input(s): "AMMONIA" in the last 168 hours. Diabetic: No results for input(s): "HGBA1C" in the last 72 hours. No results for input(s): "GLUCAP" in the last 168 hours. Cardiac Enzymes: Recent Labs  Lab 07/27/23 0310  CKTOTAL 530*   No results for input(s): "PROBNP" in the last 8760 hours. Coagulation Profile: No results for input(s): "INR", "PROTIME" in the last 168 hours. Thyroid Function Tests: No results for input(s): "TSH", "T4TOTAL", "FREET4", "T3FREE", "THYROIDAB" in the last 72 hours. Lipid Profile: No results for input(s): "CHOL", "HDL", "LDLCALC", "TRIG", "CHOLHDL", "LDLDIRECT" in the last 72 hours. Anemia Panel: No results for input(s): "VITAMINB12", "  FOLATE", "FERRITIN", "TIBC", "IRON", "RETICCTPCT" in the last 72 hours. Urine analysis:    Component Value Date/Time   COLORURINE YELLOW 05/12/2023 1504   APPEARANCEUR CLEAR 05/12/2023 1504   LABSPEC 1.013 05/12/2023 1504   PHURINE 5.0 05/12/2023 1504   GLUCOSEU NEGATIVE 05/12/2023 1504   HGBUR NEGATIVE 05/12/2023 1504   BILIRUBINUR NEGATIVE 05/12/2023 1504   KETONESUR 20 (A) 05/12/2023 1504   PROTEINUR NEGATIVE 05/12/2023 1504   NITRITE NEGATIVE 05/12/2023 1504   LEUKOCYTESUR NEGATIVE 05/12/2023 1504   Sepsis Labs: Invalid input(s): "PROCALCITONIN", "LACTICIDVEN"  Microbiology: Recent Results (from the past 240 hours)  MRSA Next Gen by PCR, Nasal     Status: None   Collection Time: 07/23/23  6:51 PM   Specimen: Nasal Mucosa; Nasal Swab  Result Value Ref Range Status   MRSA by PCR Next Gen NOT DETECTED NOT DETECTED Final    Comment: (NOTE) The GeneXpert MRSA Assay (FDA approved for NASAL specimens only), is one component of a comprehensive MRSA  colonization surveillance program. It is not intended to diagnose MRSA infection nor to guide or monitor treatment for MRSA infections. Test performance is not FDA approved in patients less than 57 years old. Performed at [facility], [address], [place], [place] [address]     Radiology Studies: No results found.    Sina Sumpter T. Beatrice Ziehm [facility]  If 7PM-7AM, please contact night-coverage [email or web address] 07/27/2023, 11:40 AM

## 2023-07-28 ENCOUNTER — Inpatient Hospital Stay (HOSPITAL_COMMUNITY)
Admission: EM | Admit: 2023-07-28 | Discharge: 2023-08-02 | DRG: 897 | Disposition: A | Discharge status: Home / Self Care | Attending: Internal Medicine | Admitting: Internal Medicine

## 2023-07-28 ENCOUNTER — Emergency Department (HOSPITAL_COMMUNITY): Payer: Self-pay

## 2023-07-28 ENCOUNTER — Encounter (HOSPITAL_COMMUNITY): Payer: Self-pay | Admitting: Internal Medicine

## 2023-07-28 ENCOUNTER — Other Ambulatory Visit: Payer: Self-pay

## 2023-07-28 DIAGNOSIS — I441 Atrioventricular block, second degree: Secondary | ICD-10-CM | POA: Diagnosis present

## 2023-07-28 DIAGNOSIS — Z6828 Body mass index (BMI) 28.0-28.9, adult: Secondary | ICD-10-CM

## 2023-07-28 DIAGNOSIS — R079 Chest pain, unspecified: Secondary | ICD-10-CM | POA: Diagnosis not present

## 2023-07-28 DIAGNOSIS — Z781 Physical restraint status: Secondary | ICD-10-CM

## 2023-07-28 DIAGNOSIS — Z91148 Patient's other noncompliance with medication regimen for other reason: Secondary | ICD-10-CM

## 2023-07-28 DIAGNOSIS — F10931 Alcohol use, unspecified with withdrawal delirium: Secondary | ICD-10-CM | POA: Diagnosis not present

## 2023-07-28 DIAGNOSIS — K7010 Alcoholic hepatitis without ascites: Secondary | ICD-10-CM | POA: Diagnosis present

## 2023-07-28 DIAGNOSIS — F10939 Alcohol use, unspecified with withdrawal, unspecified: Secondary | ICD-10-CM | POA: Diagnosis present

## 2023-07-28 DIAGNOSIS — F10121 Alcohol abuse with intoxication delirium: Secondary | ICD-10-CM | POA: Diagnosis present

## 2023-07-28 DIAGNOSIS — Z79899 Other long term (current) drug therapy: Secondary | ICD-10-CM

## 2023-07-28 DIAGNOSIS — Z888 Allergy status to other drugs, medicaments and biological substances status: Secondary | ICD-10-CM

## 2023-07-28 DIAGNOSIS — Z560 Unemployment, unspecified: Secondary | ICD-10-CM

## 2023-07-28 DIAGNOSIS — R443 Hallucinations, unspecified: Secondary | ICD-10-CM | POA: Diagnosis present

## 2023-07-28 DIAGNOSIS — D72829 Elevated white blood cell count, unspecified: Secondary | ICD-10-CM | POA: Diagnosis present

## 2023-07-28 DIAGNOSIS — F339 Major depressive disorder, recurrent, unspecified: Secondary | ICD-10-CM | POA: Diagnosis not present

## 2023-07-28 DIAGNOSIS — E876 Hypokalemia: Secondary | ICD-10-CM | POA: Diagnosis present

## 2023-07-28 DIAGNOSIS — F32A Depression, unspecified: Secondary | ICD-10-CM | POA: Diagnosis not present

## 2023-07-28 DIAGNOSIS — R072 Precordial pain: Secondary | ICD-10-CM | POA: Diagnosis present

## 2023-07-28 DIAGNOSIS — R4589 Other symptoms and signs involving emotional state: Secondary | ICD-10-CM | POA: Diagnosis present

## 2023-07-28 DIAGNOSIS — F10131 Alcohol abuse with withdrawal delirium: Secondary | ICD-10-CM | POA: Diagnosis not present

## 2023-07-28 DIAGNOSIS — F419 Anxiety disorder, unspecified: Secondary | ICD-10-CM | POA: Diagnosis not present

## 2023-07-28 DIAGNOSIS — F1010 Alcohol abuse, uncomplicated: Secondary | ICD-10-CM | POA: Diagnosis not present

## 2023-07-28 DIAGNOSIS — Z885 Allergy status to narcotic agent status: Secondary | ICD-10-CM

## 2023-07-28 DIAGNOSIS — E66811 Obesity, class 1: Secondary | ICD-10-CM | POA: Diagnosis not present

## 2023-07-28 DIAGNOSIS — F331 Major depressive disorder, recurrent, moderate: Secondary | ICD-10-CM | POA: Diagnosis not present

## 2023-07-28 DIAGNOSIS — F10930 Alcohol use, unspecified with withdrawal, uncomplicated: Secondary | ICD-10-CM | POA: Diagnosis not present

## 2023-07-28 DIAGNOSIS — F10129 Alcohol abuse with intoxication, unspecified: Secondary | ICD-10-CM | POA: Diagnosis present

## 2023-07-28 DIAGNOSIS — R7401 Elevation of levels of liver transaminase levels: Secondary | ICD-10-CM | POA: Diagnosis not present

## 2023-07-28 DIAGNOSIS — Y905 Blood alcohol level of 100-119 mg/100 ml: Secondary | ICD-10-CM | POA: Diagnosis present

## 2023-07-28 LAB — CBC WITH DIFFERENTIAL/PLATELET
Abs Immature Granulocytes: 0.12 10*3/uL — ABNORMAL HIGH (ref 0.00–0.07)
Basophils Absolute: 0 10*3/uL (ref 0.0–0.1)
Basophils Relative: 0 %
Eosinophils Absolute: 0 10*3/uL (ref 0.0–0.5)
Eosinophils Relative: 0 %
HCT: 51.4 % (ref 39.0–52.0)
Hemoglobin: 17.4 g/dL — ABNORMAL HIGH (ref 13.0–17.0)
Immature Granulocytes: 1 %
Lymphocytes Relative: 12 %
Lymphs Abs: 1.8 10*3/uL (ref 0.7–4.0)
MCH: 31.9 pg (ref 26.0–34.0)
MCHC: 33.9 g/dL (ref 30.0–36.0)
MCV: 94.1 fL (ref 80.0–100.0)
Monocytes Absolute: 0.8 10*3/uL (ref 0.1–1.0)
Monocytes Relative: 5 %
Neutro Abs: 11.9 10*3/uL — ABNORMAL HIGH (ref 1.7–7.7)
Neutrophils Relative %: 82 %
Platelets: 151 10*3/uL (ref 150–400)
RBC: 5.46 MIL/uL (ref 4.22–5.81)
RDW: 13.9 % (ref 11.5–15.5)
WBC: 14.6 10*3/uL — ABNORMAL HIGH (ref 4.0–10.5)
nRBC: 0 % (ref 0.0–0.2)

## 2023-07-28 LAB — COMPREHENSIVE METABOLIC PANEL
ALT: 146 U/L — ABNORMAL HIGH (ref 0–44)
AST: 116 U/L — ABNORMAL HIGH (ref 15–41)
Albumin: 4.5 g/dL (ref 3.5–5.0)
Alkaline Phosphatase: 78 U/L (ref 38–126)
Anion gap: 20 — ABNORMAL HIGH (ref 5–15)
BUN: 13 mg/dL (ref 6–20)
CO2: 25 mmol/L (ref 22–32)
Calcium: 9.6 mg/dL (ref 8.9–10.3)
Chloride: 96 mmol/L — ABNORMAL LOW (ref 98–111)
Creatinine, Ser: 1 mg/dL (ref 0.61–1.24)
GFR, Estimated: >60 mL/min (ref >60)
Glucose, Bld: 105 mg/dL — ABNORMAL HIGH (ref 70–99)
Potassium: 3.8 mmol/L (ref 3.5–5.1)
Sodium: 141 mmol/L (ref 135–145)
Total Bilirubin: 1.1 mg/dL (ref 0.0–1.2)
Total Protein: 8 g/dL (ref 6.5–8.1)

## 2023-07-28 LAB — MAGNESIUM: Magnesium: 1.8 mg/dL (ref 1.7–2.4)

## 2023-07-28 LAB — TROPONIN I (HIGH SENSITIVITY)
Troponin I (High Sensitivity): 9 ng/L (ref <18)
Troponin I (High Sensitivity): 8 ng/L (ref <18)
Troponin I (High Sensitivity): 10 ng/L (ref <18)

## 2023-07-28 LAB — LIPASE, BLOOD: Lipase: 33 U/L (ref 11–51)

## 2023-07-28 LAB — ETHANOL: Alcohol, Ethyl (B): 111 mg/dL — ABNORMAL HIGH (ref <10)

## 2023-07-28 MED ORDER — LORAZEPAM 2 MG/ML IJ SOLN
2.0000 mg | Freq: Once | INTRAMUSCULAR | Status: AC
  Administered 2023-07-28: 2 mg via INTRAVENOUS
  Filled 2023-07-28: qty 1

## 2023-07-28 MED ORDER — CHLORDIAZEPOXIDE HCL 25 MG PO CAPS: 25.0000 mg | ORAL_CAPSULE | Freq: Three times a day (TID) | ORAL | Status: DC

## 2023-07-28 MED ORDER — PANTOPRAZOLE SODIUM 40 MG IV SOLR
40.0000 mg | Freq: Every day | INTRAVENOUS | Status: DC
  Administered 2023-07-28: 40 mg via INTRAVENOUS
  Filled 2023-07-28: qty 10

## 2023-07-28 MED ORDER — POTASSIUM CHLORIDE CRYS ER 20 MEQ PO TBCR
40.0000 meq | EXTENDED_RELEASE_TABLET | Freq: Once | ORAL | Status: AC
  Administered 2023-07-28: 40 meq via ORAL
  Filled 2023-07-28: qty 2

## 2023-07-28 MED ORDER — FOLIC ACID 1 MG PO TABS
1.0000 mg | ORAL_TABLET | Freq: Every day | ORAL | Status: DC
  Administered 2023-07-28 – 2023-08-02 (×6): 1 mg via ORAL
  Filled 2023-07-28 (×6): qty 1

## 2023-07-28 MED ORDER — ENOXAPARIN SODIUM 40 MG/0.4ML IJ SOSY
40.0000 mg | PREFILLED_SYRINGE | Freq: Every day | INTRAMUSCULAR | Status: DC
  Administered 2023-07-28 – 2023-08-01 (×5): 40 mg via SUBCUTANEOUS
  Filled 2023-07-28 (×5): qty 0.4

## 2023-07-28 MED ORDER — ONDANSETRON HCL 4 MG/2ML IJ SOLN
4.0000 mg | Freq: Four times a day (QID) | INTRAMUSCULAR | Status: DC | PRN
  Administered 2023-07-30: 4 mg via INTRAVENOUS
  Filled 2023-07-28: qty 2

## 2023-07-28 MED ORDER — LORAZEPAM 2 MG/ML IJ SOLN
1.0000 mg | INTRAMUSCULAR | Status: AC | PRN
  Administered 2023-07-28 (×2): 2 mg via INTRAVENOUS
  Administered 2023-07-28 – 2023-07-29 (×4): 4 mg via INTRAVENOUS
  Administered 2023-07-29: 2 mg via INTRAVENOUS
  Administered 2023-07-29: 4 mg via INTRAVENOUS
  Administered 2023-07-29 (×2): 2 mg via INTRAVENOUS
  Administered 2023-07-30 (×7): 4 mg via INTRAVENOUS
  Administered 2023-07-30: 2 mg via INTRAVENOUS
  Administered 2023-07-31: 3 mg via INTRAVENOUS
  Administered 2023-07-31 (×2): 4 mg via INTRAVENOUS
  Administered 2023-07-31: 2 mg via INTRAVENOUS
  Administered 2023-07-31: 3 mg via INTRAVENOUS
  Administered 2023-08-01 (×4): 1 mg via INTRAVENOUS
  Filled 2023-07-28 (×5): qty 2
  Filled 2023-07-28 (×3): qty 1
  Filled 2023-07-28 (×2): qty 2
  Filled 2023-07-28 (×2): qty 1
  Filled 2023-07-28: qty 2
  Filled 2023-07-28: qty 1
  Filled 2023-07-28: qty 2
  Filled 2023-07-28: qty 1
  Filled 2023-07-28 (×4): qty 2
  Filled 2023-07-28 (×3): qty 1
  Filled 2023-07-28: qty 2
  Filled 2023-07-28: qty 1
  Filled 2023-07-28 (×2): qty 2

## 2023-07-28 MED ORDER — ADULT MULTIVITAMIN W/MINERALS CH
1.0000 | ORAL_TABLET | Freq: Every day | ORAL | Status: DC
  Administered 2023-07-28 – 2023-08-02 (×6): 1 via ORAL
  Filled 2023-07-28 (×6): qty 1

## 2023-07-28 MED ORDER — THIAMINE MONONITRATE 100 MG PO TABS
100.0000 mg | ORAL_TABLET | ORAL | Status: DC
  Administered 2023-07-29 – 2023-08-01 (×4): 100 mg via ORAL
  Filled 2023-07-28 (×4): qty 1

## 2023-07-28 MED ORDER — MAGNESIUM SULFATE IN D5W 1-5 GM/100ML-% IV SOLN
1.0000 g | Freq: Once | INTRAVENOUS | Status: AC
  Administered 2023-07-28: 1 g via INTRAVENOUS
  Filled 2023-07-28: qty 100

## 2023-07-28 MED ORDER — CHLORDIAZEPOXIDE HCL 25 MG PO CAPS: 25.0000 mg | ORAL_CAPSULE | ORAL | Status: DC

## 2023-07-28 MED ORDER — CHLORDIAZEPOXIDE HCL 25 MG PO CAPS: 50.0000 mg | ORAL_CAPSULE | Freq: Once | ORAL | Status: DC

## 2023-07-28 MED ORDER — MIRTAZAPINE 30 MG PO TABS
30.0000 mg | ORAL_TABLET | Freq: Every day | ORAL | Status: DC
  Administered 2023-07-28 – 2023-08-01 (×5): 30 mg via ORAL
  Filled 2023-07-28: qty 2
  Filled 2023-07-28: qty 1
  Filled 2023-07-28 (×3): qty 2

## 2023-07-28 MED ORDER — LORAZEPAM 2 MG/ML IJ SOLN
1.0000 mg | INTRAMUSCULAR | Status: DC | PRN
  Administered 2023-07-28: 2 mg via INTRAVENOUS
  Filled 2023-07-28: qty 1

## 2023-07-28 MED ORDER — ALBUTEROL SULFATE (2.5 MG/3ML) 0.083% IN NEBU: 2.5000 mg | INHALATION_SOLUTION | RESPIRATORY_TRACT | Status: DC | PRN

## 2023-07-28 MED ORDER — HYDROXYZINE HCL 25 MG PO TABS
25.0000 mg | ORAL_TABLET | Freq: Four times a day (QID) | ORAL | Status: AC | PRN
  Administered 2023-07-30: 25 mg via ORAL
  Filled 2023-07-28: qty 1

## 2023-07-28 MED ORDER — CHLORDIAZEPOXIDE HCL 25 MG PO CAPS: 25.0000 mg | ORAL_CAPSULE | Freq: Every day | ORAL | Status: DC

## 2023-07-28 MED ORDER — IBUPROFEN 200 MG PO TABS
400.0000 mg | ORAL_TABLET | Freq: Four times a day (QID) | ORAL | Status: DC | PRN
  Administered 2023-07-28 – 2023-08-01 (×6): 400 mg via ORAL
  Filled 2023-07-28 (×6): qty 2

## 2023-07-28 MED ORDER — CHLORDIAZEPOXIDE HCL 25 MG PO CAPS
25.0000 mg | ORAL_CAPSULE | Freq: Four times a day (QID) | ORAL | Status: AC
  Administered 2023-07-28 (×4): 25 mg via ORAL
  Filled 2023-07-28 (×4): qty 1

## 2023-07-28 MED ORDER — DEXMEDETOMIDINE HCL IN NACL 400 MCG/100ML IV SOLN
0.0000 ug/kg/h | INTRAVENOUS | Status: DC
  Administered 2023-07-28 (×2): 0.4 ug/kg/h via INTRAVENOUS
  Filled 2023-07-28 (×2): qty 100

## 2023-07-28 MED ORDER — LACTATED RINGERS IV BOLUS
1000.0000 mL | Freq: Once | INTRAVENOUS | Status: AC
  Administered 2023-07-28: 1000 mL via INTRAVENOUS

## 2023-07-28 MED ORDER — LOPERAMIDE HCL 2 MG PO CAPS: 2.0000 mg | ORAL_CAPSULE | ORAL | Status: AC | PRN

## 2023-07-28 MED ORDER — ONDANSETRON HCL 4 MG PO TABS: 4.0000 mg | ORAL_TABLET | Freq: Four times a day (QID) | ORAL | Status: DC | PRN

## 2023-07-28 MED ORDER — THIAMINE HCL 100 MG/ML IJ SOLN
100.0000 mg | INTRAMUSCULAR | Status: DC
  Administered 2023-07-28: 100 mg via INTRAVENOUS
  Filled 2023-07-28: qty 2

## 2023-07-28 MED ORDER — ALUM & MAG HYDROXIDE-SIMETH 200-200-20 MG/5ML PO SUSP
30.0000 mL | Freq: Once | ORAL | Status: AC
  Administered 2023-07-28: 30 mL via ORAL
  Filled 2023-07-28: qty 30

## 2023-07-28 NOTE — Plan of Care (Signed)

## 2023-07-28 NOTE — ED Triage Notes (Signed)
 Pt BIB EMS coming from home c/o chest pain that started 4 hrs. Ago. States had last drink around same time. Pt vomiting upon arrival. States drinking a bottle of vodka a day. Having withdrawal symptoms

## 2023-07-28 NOTE — Plan of Care (Signed)
 Notified by RN staff of multiple sinus pauses and recurrence of chest pain.  EKG personally reviewed, QTc 480 noted but no acute changes, no ST changes, no block.  Troponin ordered. Will give K and Mg to keep at 4 and 2, respectively.  Discussed via secure chat with cardiology Dr. Bjorn Pippin who feels this is likely due to precedex.  No other recommendations in the mean time, will see formally in the morning.  Will plan to monitor closely, and if pauses worsen I will plan to transfer to [facility] tonight for more urgent in person evaluation.

## 2023-07-28 NOTE — Progress Notes (Signed)
 Patient complaining of chest pain upon arrival to unit. Patient had two symptomatic pauses in EKG rhythm at 1403 and 1404. One pause was 2.31 seconds and other pause was 2.33 seconds. This RN witnessed patient grabbing chest during pauses due to pain. Patient was also bradycardic at this time. Precedex stopped. 12 lead EKG done. Dr. Kirby Crigler made aware of situation. EKG in patient chart.

## 2023-07-28 NOTE — ED Notes (Signed)
 Called lab to add Lipase to previous samples sent

## 2023-07-28 NOTE — H&P (Signed)
 History and Physical  Jermaine [name] ZOX:[id number] DOB: Apr 12, 1966 DOA: 07/28/2023  PCP: Patient, No Pcp Per   Chief Complaint: Vomiting, withdrawal symptoms  HPI: Jermaine [name] is a 58 y.o. male with medical history significant for severe alcohol abuse, delirium tremens and recurrent hospitalizations for the same as well as history of leaving AMA being admitted to the hospital with complaints of alcohol withdrawal symptoms and vomiting.  He was actually admitted to the hospital 1/28 to 2/14 when he left AMA, was admitted again 2/25 to 3/1 and left AMA on that day.  During his most recent hospital stay, he was admitted to the stepdown unit and started on Precedex drip, required phenobarbital taper, IV Ativan, and required physical restraints.  He was seen in consultation by PCCM on 07/26/2023.  On 3/1, he was improving, his CIWA scores had significantly improved and he decided to leave AMA later that day.  Early this morning, he returns to the emergency department with complaints of withdrawal symptoms and requesting hospital admission.  He typically drinks a bottle of vodka daily.  States that he came back to the hospital this morning due to abdominal discomfort and vomiting, though he denies any hematemesis or coffee-ground emesis.  After leaving the hospital yesterday, he did drink some vodka but states it was very little.  Review of Systems: Please see HPI for pertinent positives and negatives. A complete 10 system review of systems are otherwise negative.  Past Medical History:  Diagnosis Date   Alcohol abuse    Anxiety    Class 1 obesity 09/14/2021   Depression    Past Surgical History:  Procedure Laterality Date   HERNIA REPAIR     Social History:  reports that he has never smoked. He has never been exposed to tobacco smoke. He has never used smokeless tobacco. He reports current alcohol use of about 20.0 standard drinks of alcohol per week. He reports that he does not currently use  drugs.  Allergies  Allergen Reactions   Ketamine     Hx of causing fever in 2024    Codeine Itching    Family History  Adopted: Yes     Prior to Admission medications   Medication Sig Start Date End Date Taking? Authorizing Provider  folic acid (FOLVITE) 1 MG tablet Take 1 tablet (1 mg total) by mouth daily. 01/28/23  Yes Danford, Earl Lites, MD  hydrOXYzine (ATARAX) 25 MG tablet Take 50 mg by mouth every 8 (eight) hours as needed for anxiety. 06/10/23  Yes [provider]  mirtazapine (REMERON) 30 MG tablet Take 30 mg by mouth at bedtime.   Yes [provider]  thiamine (VITAMIN B-1) 100 MG tablet Take 1 tablet (100 mg total) by mouth daily. 05/20/22  Yes [name], MD    Physical Exam: BP (!) 135/96 (BP Location: Right Arm)   Pulse (!) 118   Temp (!) 97.5 F (36.4 C) (Oral)   Resp 20   Ht 5\' 10"  (1.778 m)   Wt 90.7 kg   SpO2 100%   BMI 28.70 kg/m  General:  Alert, oriented, calm, in no acute distress, sleeping on my arrival easily awakened.  Currently pleasant and cooperative. Cardiovascular: RRR, no murmurs or rubs, no peripheral edema  Respiratory: clear to auscultation bilaterally, no wheezes, no crackles  Abdomen: soft, nontender, nondistended, normal bowel tones heard  Skin: dry, no rashes  Musculoskeletal: no joint effusions, normal range of motion  Psychiatric: appropriate affect, normal speech  Neurologic: extraocular muscles  intact, clear speech, moving all extremities with intact sensorium.  No asterixis.         Labs on Admission:  Basic Metabolic Panel: Recent Labs  Lab 07/23/23 0757 07/24/23 0254 07/26/23 0311 07/27/23 0310 07/28/23 0715  NA 142 141 139 137 141  K 4.2 4.2 3.7 3.5 3.8  CL 99 105 104 101 96*  CO2 24 27 23 25 25   GLUCOSE 101* 70 94 91 105*  BUN 11 13 11 15 13   CREATININE 1.06 0.55* 0.67 0.53* 1.00  CALCIUM 8.8* 7.8* 8.9 8.5* 9.6  MG 1.9  --   --  1.9 1.8  PHOS  --   --   --  3.4  --    Liver Function  Tests: Recent Labs  Lab 07/23/23 0757 07/24/23 0254 07/27/23 0310 07/28/23 0715  AST 136* 118* 126* 116*  ALT 124* 105* 164* 146*  ALKPHOS 62 43 62 78  BILITOT 0.9 1.3* 1.0 1.1  PROT 8.3* 6.0* 5.7* 8.0  ALBUMIN 4.8 3.5 3.3* 4.5   Recent Labs  Lab 07/28/23 0715  LIPASE 33   No results for input(s): "AMMONIA" in the last 168 hours. CBC: Recent Labs  Lab 07/23/23 0757 07/24/23 0254 07/26/23 0311 07/27/23 0310 07/28/23 0715  WBC 9.5 4.6 5.5 6.6 14.6*  NEUTROABS 5.9  --   --   --  11.9*  HGB 17.6* 14.0 15.8 15.0 17.4*  HCT 53.5* 44.2 48.3 45.6 51.4  MCV 95.4 98.9 97.6 98.1 94.1  PLT 303 158 112* 116* 151   Cardiac Enzymes: Recent Labs  Lab 07/27/23 0310  CKTOTAL 530*   BNP (last 3 results) No results for input(s): "BNP" in the last 8760 hours.  ProBNP (last 3 results) No results for input(s): "PROBNP" in the last 8760 hours.  CBG: No results for input(s): "GLUCAP" in the last 168 hours.  Radiological Exams on Admission: DG Chest Portable 1 View Result Date: 07/28/2023 CLINICAL DATA:  Chest pain. EXAM: PORTABLE CHEST 1 VIEW COMPARISON:  07/01/2023 FINDINGS: Heart size and mediastinal contours are unremarkable. There is no pleural fluid, interstitial edema or airspace disease. Visualized osseous structures are unremarkable. IMPRESSION: No active disease. Electronically Signed   By: Signa Kell M.D.   On: 07/28/2023 08:26   Assessment/Plan Jermaine [name] is a 58 y.o. male with medical history significant for severe alcohol abuse, delirium tremens and recurrent hospitalizations for the same as well as history of leaving AMA being admitted to the hospital with complaints of alcohol withdrawal symptoms and vomiting.  Acute alcohol withdrawal-with history of severe alcohol abuse, delirium tremens.  Left AMA from the hospital 3/1 after recently being on Precedex drip and requiring restraints.  Clearly at high risk for severe withdrawals. -Inpatient admission to  stepdown -Thiamine, folate, multivitamin -Continue IV Precedex drip, along with Librium taper  Leukocytosis-without evidence of acute infection, likely due to recent vomiting  Nausea and vomiting-this seems to have resolved, will place on IV Protonix for the time being for presumed gastritis, with Zofran as needed for nausea.  DVT prophylaxis: Lovenox     Code Status: Full Code  Consults called: None  Admission status: The appropriate patient status for this patient is INPATIENT. Inpatient status is judged to be reasonable and necessary in order to provide the required intensity of service to ensure the patient's safety. The patient's presenting symptoms, physical exam findings, and initial radiographic and laboratory data in the context of their chronic comorbidities is felt to place them at high risk for  further clinical deterioration. Furthermore, it is not anticipated that the patient will be medically stable for discharge from the hospital within 2 midnights of admission.    I certify that at the point of admission it is my clinical judgment that the patient will require inpatient hospital care spanning beyond 2 midnights from the point of admission due to high intensity of service, high risk for further deterioration and high frequency of surveillance required  Time spent: 59 minutes  Annisha Baar Sharlette Dense MD [facility] Pager [phone]  If 7PM-7AM, please contact night-coverage [email or web address] Password [email or web address]  07/28/2023, 10:39 AM

## 2023-07-28 NOTE — ED Notes (Signed)
 Patient Spo2 between 87-89% put on [place] 2L

## 2023-07-28 NOTE — ED Provider Notes (Signed)
 [place] EMERGENCY DEPARTMENT AT [facility] Provider Note   CSN: [id number] Arrival date & time: 07/28/23  4098     History  Chief Complaint  Patient presents with   Chest Pain    Jermaine [name] is a 58 y.o. male.   Chest Pain Patient presents with chest pain alcohol withdrawal.  Reportedly has been drinking but not enough.  Chronic alcoholic.  Left AMA from the hospital yesterday.  Had been on pharmacologic alcohol, Precedex, phenobarbital, Ativan.  States he is seeing things right now.  Has vomited in the ER.  States he has some dull chest pain.     Home Medications Prior to Admission medications   Medication Sig Start Date End Date Taking? Authorizing Provider  folic acid (FOLVITE) 1 MG tablet Take 1 tablet (1 mg total) by mouth daily. 01/28/23  Yes Danford, Earl Lites, MD  hydrOXYzine (ATARAX) 25 MG tablet Take 50 mg by mouth every 8 (eight) hours as needed for anxiety. 06/10/23  Yes [provider]  mirtazapine (REMERON) 30 MG tablet Take 30 mg by mouth at bedtime.   Yes [provider]  thiamine (VITAMIN B-1) 100 MG tablet Take 1 tablet (100 mg total) by mouth daily. 05/20/22  Yes [name], MD      Allergies    Ketamine and Codeine    Review of Systems   Review of Systems  Cardiovascular:  Positive for chest pain.    Physical Exam Updated Vital Signs BP 114/66   Pulse 74   Temp 98.6 F (37 C) (Oral)   Resp 13   Ht 5\' 10"  (1.778 m)   Wt 90.7 kg   SpO2 96%   BMI 28.70 kg/m  Physical Exam Vitals and nursing note reviewed.  Cardiovascular:     Rate and Rhythm: Tachycardia present.  Pulmonary:     Breath sounds: No wheezing.  Chest:     Chest wall: No tenderness.  Abdominal:     Palpations: There is hepatomegaly.     Tenderness: There is no abdominal tenderness.  Neurological:     Mental Status: He is alert.     Comments: Patient is tremulous.  States he is seeing things.  Psychiatric:     Comments: Patient is  rather pressured.     ED Results / Procedures / Treatments   Labs (all labs ordered are listed, but only abnormal results are displayed) Labs Reviewed  CBC WITH DIFFERENTIAL/PLATELET - Abnormal; Notable for the following components:      Result Value   WBC 14.6 (*)    Hemoglobin 17.4 (*)    Neutro Abs 11.9 (*)    Abs Immature Granulocytes 0.12 (*)    All other components within normal limits  ETHANOL - Abnormal; Notable for the following components:   Alcohol, Ethyl (B) 111 (*)    All other components within normal limits  COMPREHENSIVE METABOLIC PANEL - Abnormal; Notable for the following components:   Chloride 96 (*)    Glucose, Bld 105 (*)    AST 116 (*)    ALT 146 (*)    Anion gap 20 (*)    All other components within normal limits  MAGNESIUM  LIPASE, BLOOD  TROPONIN I (HIGH SENSITIVITY)    EKG None  Radiology DG Chest Portable 1 View Result Date: 07/28/2023 CLINICAL DATA:  Chest pain. EXAM: PORTABLE CHEST 1 VIEW COMPARISON:  07/01/2023 FINDINGS: Heart size and mediastinal contours are unremarkable. There is no pleural fluid, interstitial edema or airspace  disease. Visualized osseous structures are unremarkable. IMPRESSION: No active disease. Electronically Signed   By: Signa Kell M.D.   On: 07/28/2023 08:26    Procedures Procedures    Medications Ordered in ED Medications  dexmedetomidine (PRECEDEX) 400 MCG/100ML (4 mcg/mL) infusion (0 mcg/kg/hr  90.7 kg Intravenous Stopped 07/28/23 1404)  thiamine (VITAMIN B1) injection 100 mg (has no administration in time range)    Or  thiamine (VITAMIN B1) tablet 100 mg (has no administration in time range)  folic acid (FOLVITE) tablet 1 mg (1 mg Oral Given 07/28/23 1117)  multivitamin with minerals tablet 1 tablet (1 tablet Oral Given 07/28/23 1117)  LORazepam (ATIVAN) injection 1-2 mg (has no administration in time range)  hydrOXYzine (ATARAX) tablet 25 mg (has no administration in time range)  chlordiazePOXIDE (LIBRIUM)  capsule 25 mg (25 mg Oral Given 07/28/23 1449)    Followed by  chlordiazePOXIDE (LIBRIUM) capsule 25 mg (has no administration in time range)    Followed by  chlordiazePOXIDE (LIBRIUM) capsule 25 mg (has no administration in time range)    Followed by  chlordiazePOXIDE (LIBRIUM) capsule 25 mg (has no administration in time range)  loperamide (IMODIUM) capsule 2-4 mg (has no administration in time range)  mirtazapine (REMERON) tablet 30 mg (has no administration in time range)  enoxaparin (LOVENOX) injection 40 mg (has no administration in time range)  ibuprofen (ADVIL) tablet 400 mg (has no administration in time range)  ondansetron (ZOFRAN) tablet 4 mg (has no administration in time range)    Or  ondansetron (ZOFRAN) injection 4 mg (has no administration in time range)  albuterol (PROVENTIL) (2.5 MG/3ML) 0.083% nebulizer solution 2.5 mg (has no administration in time range)  pantoprazole (PROTONIX) injection 40 mg (40 mg Intravenous Given 07/28/23 1117)  magnesium sulfate IVPB 1 g 100 mL (has no administration in time range)  LORazepam (ATIVAN) injection 2 mg (2 mg Intravenous Given 07/28/23 0719)  lactated ringers bolus 1,000 mL (0 mLs Intravenous Stopped 07/28/23 0851)  LORazepam (ATIVAN) injection 2 mg (2 mg Intravenous Given 07/28/23 0805)  LORazepam (ATIVAN) injection 2 mg (2 mg Intravenous Given 07/28/23 0934)  alum & mag hydroxide-simeth (MAALOX/MYLANTA) 200-200-20 MG/5ML suspension 30 mL (30 mLs Oral Given 07/28/23 1449)  potassium chloride SA (KLOR-CON M) CR tablet 40 mEq (40 mEq Oral Given 07/28/23 1449)    ED Course/ Medical Decision Making/ A&P                                 Medical Decision Making Amount and/or Complexity of Data Reviewed Labs: ordered. Radiology: ordered.  Risk Prescription drug management. Decision regarding hospitalization.   Patient presents after leaving hospital AMA yesterday.  Appears to be in florid alcohol withdrawal at this time.  States he is  hallucinating.  History of DTs requiring multiple medications.  Will give IV Ativan at this time.  Will need to be liberal with it.  Basic blood work done and alcohol level is 100.  LFTs mildly elevated but similar to prior.  Patient had 2 mg of Ativan and will now give 2 more due to continued alcohol withdrawal symptoms.  Will start Precedex drip.  Will discuss with hospitalist for admission.  Just came off the hospitalist service yesterday.  CRITICAL CARE Performed by: Benjiman Core Total critical care time: 45 minutes Critical care time was exclusive of separately billable procedures and treating other patients. Critical care was necessary to treat or prevent imminent  or life-threatening deterioration. Critical care was time spent personally by me on the following activities: development of treatment plan with patient and/or surrogate as well as nursing, discussions with consultants, evaluation of patient's response to treatment, examination of patient, obtaining history from patient or surrogate, ordering and performing treatments and interventions, ordering and review of laboratory studies, ordering and review of radiographic studies, pulse oximetry and re-evaluation of patient's condition.           Final Clinical Impression(s) / ED Diagnoses Final diagnoses:  Delirium tremens (HCC)  Alcohol withdrawal syndrome, with delirium [facility])    Rx / DC Orders ED Discharge Orders     None         Benjiman Core, MD 07/28/23 1455

## 2023-07-29 ENCOUNTER — Inpatient Hospital Stay (HOSPITAL_COMMUNITY)

## 2023-07-29 DIAGNOSIS — I441 Atrioventricular block, second degree: Secondary | ICD-10-CM

## 2023-07-29 DIAGNOSIS — R079 Chest pain, unspecified: Secondary | ICD-10-CM | POA: Diagnosis not present

## 2023-07-29 DIAGNOSIS — F10931 Alcohol use, unspecified with withdrawal delirium: Secondary | ICD-10-CM | POA: Diagnosis not present

## 2023-07-29 DIAGNOSIS — R072 Precordial pain: Secondary | ICD-10-CM | POA: Diagnosis not present

## 2023-07-29 LAB — CBC
HCT: 42.6 % (ref 39.0–52.0)
Hemoglobin: 13.7 g/dL (ref 13.0–17.0)
MCH: 32.2 pg (ref 26.0–34.0)
MCHC: 32.2 g/dL (ref 30.0–36.0)
MCV: 100 fL (ref 80.0–100.0)
Platelets: 106 10*3/uL — ABNORMAL LOW (ref 150–400)
RBC: 4.26 MIL/uL (ref 4.22–5.81)
RDW: 14.5 % (ref 11.5–15.5)
WBC: 10.8 10*3/uL — ABNORMAL HIGH (ref 4.0–10.5)
nRBC: 0 % (ref 0.0–0.2)

## 2023-07-29 LAB — COMPREHENSIVE METABOLIC PANEL
ALT: 87 U/L — ABNORMAL HIGH (ref 0–44)
AST: 55 U/L — ABNORMAL HIGH (ref 15–41)
Albumin: 3.4 g/dL — ABNORMAL LOW (ref 3.5–5.0)
Alkaline Phosphatase: 61 U/L (ref 38–126)
Anion gap: 9 (ref 5–15)
BUN: 20 mg/dL (ref 6–20)
CO2: 28 mmol/L (ref 22–32)
Calcium: 8.8 mg/dL — ABNORMAL LOW (ref 8.9–10.3)
Chloride: 101 mmol/L (ref 98–111)
Creatinine, Ser: 0.83 mg/dL (ref 0.61–1.24)
GFR, Estimated: >60 mL/min (ref >60)
Glucose, Bld: 101 mg/dL — ABNORMAL HIGH (ref 70–99)
Potassium: 3.5 mmol/L (ref 3.5–5.1)
Sodium: 138 mmol/L (ref 135–145)
Total Bilirubin: 1.2 mg/dL (ref 0.0–1.2)
Total Protein: 6.1 g/dL — ABNORMAL LOW (ref 6.5–8.1)

## 2023-07-29 LAB — ECHOCARDIOGRAM COMPLETE
AR max vel: 2.63 cm2
AV Area VTI: 2.58 cm2
AV Area mean vel: 2.25 cm2
AV Mean grad: 7 mmHg
AV Peak grad: 10.5 mmHg
Ao pk vel: 1.62 m/s
Area-P 1/2: 3.83 cm2
Height: 70 in
S' Lateral: 3.5 cm
Weight: 3200 [oz_av]

## 2023-07-29 LAB — TSH: TSH: 1.241 u[IU]/mL (ref 0.350–4.500)

## 2023-07-29 MED ORDER — ORAL CARE MOUTH RINSE: 15.0000 mL | OROMUCOSAL | Status: DC | PRN

## 2023-07-29 MED ORDER — PERFLUTREN LIPID MICROSPHERE
1.0000 mL | INTRAVENOUS | Status: AC | PRN
  Administered 2023-07-29: 2 mL via INTRAVENOUS

## 2023-07-29 MED ORDER — SODIUM CHLORIDE 0.9 % IV SOLN
1000.0000 mg | Freq: Once | INTRAVENOUS | Status: AC
  Administered 2023-07-29: 1000 mg via INTRAVENOUS
  Filled 2023-07-29: qty 7.69

## 2023-07-29 MED ORDER — PHENOBARBITAL 32.4 MG PO TABS: 32.4000 mg | ORAL_TABLET | Freq: Three times a day (TID) | ORAL | Status: DC

## 2023-07-29 MED ORDER — PHENOBARBITAL 32.4 MG PO TABS
97.2000 mg | ORAL_TABLET | Freq: Three times a day (TID) | ORAL | Status: AC
  Administered 2023-07-29 – 2023-07-30 (×4): 97.2 mg via ORAL
  Filled 2023-07-29 (×6): qty 3

## 2023-07-29 MED ORDER — PANTOPRAZOLE SODIUM 40 MG PO TBEC
40.0000 mg | DELAYED_RELEASE_TABLET | Freq: Every day | ORAL | Status: DC
  Administered 2023-07-29 – 2023-08-02 (×5): 40 mg via ORAL
  Filled 2023-07-29 (×5): qty 1

## 2023-07-29 MED ORDER — CHLORHEXIDINE GLUCONATE CLOTH 2 % EX PADS
6.0000 | MEDICATED_PAD | Freq: Every day | CUTANEOUS | Status: DC
  Administered 2023-07-30 – 2023-08-01 (×3): 6 via TOPICAL

## 2023-07-29 MED ORDER — PHENOBARBITAL 64.8 MG PO TABS
64.8000 mg | ORAL_TABLET | Freq: Three times a day (TID) | ORAL | Status: AC
  Administered 2023-07-31 – 2023-08-02 (×6): 64.8 mg via ORAL
  Filled 2023-07-29 (×6): qty 2

## 2023-07-29 NOTE — Consult Note (Signed)
 Cardiology Consultation   Patient ID: Jermaine [name] MRN: [id number]; DOB: Apr 05, 1966  Admit date: 07/28/2023 Date of Consult: 07/29/2023  PCP:  Patient, No Pcp Per   [facility] Providers Cardiologist:  None        Patient Profile:   Jermaine [name] is a 58 y.o. male with a hx of alcohol abuse, delirium tremens, recurrent hospitalizations and leaving AMA, who is being seen 07/29/2023 for the evaluation of multiple sinus pauses and chest pain at the request of Meredeth Ide MD..  History of Present Illness:   Jermaine [name] was hospitalized from 01/28 to 2/14 and left AMA, was hospitalized 2/25 to 3/1 and left AMA, both times was hospitalized for alcohol abuse and delirium tremens. On 03/02 presented to the emergency department with withdrawal symptoms, abdominal discomfort and vomiting.  Denies any hematemesis or coffee-ground emesis. Typically drinks bottle of vodka a day, reported drinking some vodka after leaving the hospital.  Was admitted to stepdown unit and started on Precedex drip, phenobarbital taper, IV Ativan and was placed in physical restraints.  At around 2 PM 2/3 had 2 symptomatic pauses that lasted about 2 seconds each.  An EKG was ordered and showed a QTc of 480 with no acute changes. Kirby Crigler Mir MD discussed with Dr Darryl Nestle (cardiology) was recommended the pauses were most likely due to Precedex.  Labs have shown troponin 9-> 8 -> 10, K 3.5 , Cr 0.83, Mg 1.8. LFT's were elevated with AST 116 ->55, ALT 146-> 87, Albumin was decreased at 3.4. WBC's were slightly elevated at 14.6 -> 10.8 and Platelets were decreased at 151 -> 106. The chest x-ray on 07/28/23 showed no active disease.   On physical exam stated that he had chest pain yesterday prior to the nausea and vomiting. The pain is worse with drinking water. Drank some alcohol at home. The pain is substernal and does not radiate. Reported having a short episode of the chest pain during the physical exam. The chest pain  is not the same as previous chest pain. Had some associated shortness of breath. Denies any fever, exertional chest pain, pleuritic chest pain. Denies having any symptoms during av node non-conduction on 3/2. Currently lives at home. Does not currently work. [facility].  Denies any tobacco use. Reported being adopted and does not know family history. Reported cocaine use in the distant past but no recent use. Denies methamphetamine use.  No previous cardiac history  Past Medical History:  Diagnosis Date   Alcohol abuse    Anxiety    Class 1 obesity 09/14/2021   Depression     Past Surgical History:  Procedure Laterality Date   HERNIA REPAIR         Inpatient Medications: Scheduled Meds:  chlordiazePOXIDE  25 mg Oral TID   Followed by   Melene Muller ON 07/30/2023] chlordiazePOXIDE  25 mg Oral BH-qamhs   Followed by   Melene Muller ON 07/31/2023] chlordiazePOXIDE  25 mg Oral Daily   Chlorhexidine Gluconate Cloth  6 each Topical Daily   enoxaparin (LOVENOX) injection  40 mg Subcutaneous QHS   folic acid  1 mg Oral Daily   mirtazapine  30 mg Oral QHS   multivitamin with minerals  1 tablet Oral Daily   pantoprazole (PROTONIX) IV  40 mg Intravenous Daily   thiamine (VITAMIN B1) injection  100 mg Intravenous Q24H   Or   thiamine  100 mg Oral Q24H   Continuous Infusions:  dexmedetomidine (PRECEDEX) IV infusion 0.1 mcg/kg/hr (07/29/23 0656)   PRN Meds: albuterol, hydrOXYzine, ibuprofen, loperamide, LORazepam, ondansetron **OR** ondansetron (ZOFRAN) IV, mouth rinse  Allergies:    Allergies  Allergen Reactions   Ketamine     Hx of causing fever in 2024    Codeine Itching    Social History:   Social History   Socioeconomic History   Marital status: Single    Spouse name: Not on file   Number of children: Not on file   Years of education: Not on file   Highest education level: Not on file  Occupational History   Not on file  Tobacco Use   Smoking status:  Never    Passive exposure: Never   Smokeless tobacco: Never  Vaping Use   Vaping status: Never Used  Substance and Sexual Activity   Alcohol use: Yes    Alcohol/week: 20.0 standard drinks of alcohol    Types: 20 Standard drinks or equivalent per week   Drug use: Not Currently   Sexual activity: Not on file  Other Topics Concern   Not on file  Social History Narrative   Not on file   Social Drivers of Health   Financial Resource Strain: Not on file  Food Insecurity: Patient Declined (07/28/2023)   Hunger Vital Sign    Worried About Running Out of Food in the Last Year: Patient declined    Ran Out of Food in the Last Year: Patient declined  Transportation Needs: Patient Declined (07/28/2023)   PRAPARE - Administrator, Civil Service (Medical): Patient declined    Lack of Transportation (Non-Medical): Patient declined  Physical Activity: Not on file  Stress: Not on file  Social Connections: Not on file  Intimate Partner Violence: Patient Declined (07/28/2023)   Humiliation, Afraid, Rape, and Kick questionnaire    Fear of Current or Ex-Partner: Patient declined    Emotionally Abused: Patient declined    Physically Abused: Patient declined    Sexually Abused: Patient declined    Family History:    Family History  Adopted: Yes     ROS:  Please see the history of present illness.   All other ROS reviewed and negative.     Physical Exam/Data:   Vitals:   07/29/23 0600 07/29/23 0700 07/29/23 0705 07/29/23 0737  BP: (!) 101/59 91/66  132/79  Pulse: 61 83 75 66  Resp: 19 14 17    Temp:   97.8 F (36.6 C)   TempSrc:   Oral   SpO2: 96% 95% 95%   Weight:      Height:        Intake/Output Summary (Last 24 hours) at 07/29/2023 0759 Last data filed at 07/29/2023 0656 Gross per 24 hour  Intake 1223.57 ml  Output 870 ml  Net 353.57 ml      07/28/2023    6:58 AM 07/23/2023    6:48 PM 07/12/2023    5:00 AM  Last 3 Weights  Weight (lbs) 200 lb 181 lb 7 oz 195 lb 8.8  oz  Weight (kg) 90.719 kg 82.3 kg 88.7 kg     Body mass index is 28.7 kg/m.  General:  a disheveled male in no acute distress. On 2 L/min [place]. Was initially sleeping but able to answer questions on exam HEENT: normal Neck: no JVD Vascular:  Distal pulses 2+ bilaterally Cardiac:  normal S1, S2; RRR; no murmur. Lungs:  clear to auscultation bilaterally, no wheezing, rhonchi or rales  Abd: soft, nontender,  no hepatomegaly  Ext: no edema Musculoskeletal:  No deformities, chest non-tender to - palpation. Skin: warm and dry  Neuro:  CNs 2-12 intact, no focal abnormalities noted. Psych:  Normal affect   EKG:  The EKG was personally reviewed and demonstrates:  normal sinus rhythm, a prolonged QTcB of 483, a rate of 74, no st elevation or t wave inversions. Telemetry:  Telemetry was personally reviewed and demonstrates:  multiple episodes of AV node non conduction. The first happened at 1403 on 3/2 lasted about 2.95 seconds and there was non-conduction of the p-waves. At 1404 on 3/2 the second lasted 2.95 seconds and there was non-conduction of the p-waves. A third pause happened at 1452 on 3/2. It was difficult to tell if there were p-waves present or not as there was background movement.  On 3/2 at about 2300 there was what appeared to be sinus tachycardia in the 130's it lasted for at least 30 minutes.  Relevant CV Studies:  TTE 12/2022 IMPRESSIONS     1. Left ventricular ejection fraction, by estimation, is 65 to 70%. The  left ventricle has normal function. The left ventricle has no regional  wall motion abnormalities. Left ventricular diastolic parameters were  normal.   2. Right ventricular systolic function is normal. The right ventricular  size is normal.   3. The mitral valve is normal in structure. Trivial mitral valve  regurgitation. No evidence of mitral stenosis.   4. The aortic valve is normal in structure. Aortic valve regurgitation is  not visualized. No aortic stenosis is  present.   5. The inferior vena cava is normal in size with greater than 50%  respiratory variability, suggesting right atrial pressure of 3 mmHg.   6. Technically limited study due to poor sound wave transmission.   FINDINGS   Left Ventricle: Left ventricular ejection fraction, by estimation, is 65  to 70%. The left ventricle has normal function. The left ventricle has no  regional wall motion abnormalities. Definity contrast agent was given IV  to delineate the left ventricular   endocardial borders. The left ventricular internal cavity size was normal  in size. There is no left ventricular hypertrophy. Left ventricular  diastolic parameters were normal.   Right Ventricle: The right ventricular size is normal. No increase in  right ventricular wall thickness. Right ventricular systolic function is  normal.   Left [facility]: Left atrial size was normal in size.   Right [facility]: Right atrial size was normal in size.   Pericardium: There is no evidence of pericardial effusion.   Mitral Valve: The mitral valve is normal in structure. Trivial mitral  valve regurgitation. No evidence of mitral valve stenosis.   Tricuspid Valve: The tricuspid valve is normal in structure. Tricuspid  valve regurgitation is trivial. No evidence of tricuspid stenosis.   Aortic Valve: The aortic valve is normal in structure. Aortic valve  regurgitation is not visualized. No aortic stenosis is present.   Pulmonic Valve: The pulmonic valve was normal in structure. Pulmonic valve  regurgitation is not visualized. No evidence of pulmonic stenosis.   Aorta: The aortic root is normal in size and structure.   Venous: The inferior vena cava is normal in size with greater than 50%  respiratory variability, suggesting right atrial pressure of 3 mmHg.   IAS/Shunts: No atrial level shunt detected by color flow Doppler.    Laboratory Data:  High Sensitivity Troponin:   Recent Labs  Lab 07/28/23 1437  07/28/23 1651 07/28/23 1902  TROPONINIHS  9 8 10      Chemistry Recent Labs  Lab 07/23/23 0757 07/24/23 0254 07/27/23 0310 07/28/23 0715 07/29/23 0322  NA 142   < > 137 141 138  K 4.2   < > 3.5 3.8 3.5  CL 99   < > 101 96* 101  CO2 24   < > 25 25 28   GLUCOSE 101*   < > 91 105* 101*  BUN 11   < > 15 13 20   CREATININE 1.06   < > 0.53* 1.00 0.83  CALCIUM 8.8*   < > 8.5* 9.6 8.8*  MG 1.9  --  1.9 1.8  --   GFRNONAA >60   < > >60 >60 >60  ANIONGAP 19*   < > 11 20* 9   < > = values in this interval not displayed.    Recent Labs  Lab 07/27/23 0310 07/28/23 0715 07/29/23 0322  PROT 5.7* 8.0 6.1*  ALBUMIN 3.3* 4.5 3.4*  AST 126* 116* 55*  ALT 164* 146* 87*  ALKPHOS 62 78 61  BILITOT 1.0 1.1 1.2   Lipids No results for input(s): "CHOL", "TRIG", "HDL", "LABVLDL", "LDLCALC", "CHOLHDL" in the last 168 hours.  Hematology Recent Labs  Lab 07/27/23 0310 07/28/23 0715 07/29/23 0322  WBC 6.6 14.6* 10.8*  RBC 4.65 5.46 4.26  HGB 15.0 17.4* 13.7  HCT 45.6 51.4 42.6  MCV 98.1 94.1 100.0  MCH 32.3 31.9 32.2  MCHC 32.9 33.9 32.2  RDW 13.5 13.9 14.5  PLT 116* 151 106*   Thyroid No results for input(s): "TSH", "FREET4" in the last 168 hours.  BNPNo results for input(s): "BNP", "PROBNP" in the last 168 hours.  DDimer No results for input(s): "DDIMER" in the last 168 hours.   Radiology/Studies:  DG Chest Portable 1 View Result Date: 07/28/2023 CLINICAL DATA:  Chest pain. EXAM: PORTABLE CHEST 1 VIEW COMPARISON:  07/01/2023 FINDINGS: Heart size and mediastinal contours are unremarkable. There is no pleural fluid, interstitial edema or airspace disease. Visualized osseous structures are unremarkable. IMPRESSION: No active disease. Electronically Signed   By: Signa Kell M.D.   On: 07/28/2023 08:26     Assessment and Plan:   Chest pain - has had multiple recent hospitalizations for alcohol abuse and delirium tremens.  - EKG is negative of acute ischemic changes, troponins are  negative troponin 9-> 8 -> 10. This rules out ACS. - Creatinine 0.83, platelets decreased at 106, hemoglobin normal 13.7 - Order Echo   Episodic AV node non conduction. - had multiple episodes of AV node non conduction at about 2 pm on 3/2. These lasted 3.03 s, 2.95 s, and 2.58 s. Denies having any symptoms during these episodes.  - would avoid AV nodal agents - Is a poor candidate for a PPM with recurrent hospitalizations for alcohol abuse and delirium tremens. - Todays potassium decreased slightly to 3.5, magnesium was 1.8 on 3/2. Continue replacement of potassium and magnesium. - Order TSH. - Order cardiac monitor  QT prolongation - EKG on 07/28/23 showed a prolonged QTcB of 483. - continue current medications including zofran for withdrawal  - Will continue to monitor.   Alcohol abuse, delirium tremens, recurrent hospitalizations - complicates management of Chest pain and AV node non-conduction. Discussed being in alcoholics anonymous but stated has difficulty going to it.  - Alcohol Ethyl (B) was elevated at 111. - manage per primary  Risk Assessment/Risk Scores:              For questions  or updates, please contact [facility] Please consult [email or web address] for contact info under    Signed, Arabella Merles, PA-C  07/29/2023 7:59 AM   I have seen and examined the patient along with Arabella Merles, PAC.  I have reviewed the chart, notes and new data.  I agree with PA/NP's note.  Key new complaints: He has intermittent problems with a "pinching" type of chest pain that occurs after he drinks water.  It does not occur when he exercises (he reports lifting weights) for when he walks.  He has never had a syncopal event that sounds like arrhythmia. Key examination changes: Normal cardiovascular examination.  He is alert and oriented and currently not agitated. Key new findings / data: Reviewed his telemetry strips.  All his pauses are due to second-degree atrioventricular block  Mobitz type I and are preceded by a slight deceleration in the sinus rate, consistent with a vagal response.  QRS complex was always narrow.  Several apnea events were also recorded by his monitor around that time.  They all occurred around 3 PM yesterday when he was receiving several sedatives.  After his Precedex drip was discontinued he has not had any new events. ECG is normal other than borderline prolongation of the QT interval.  Cardiac high-sensitivity troponin is normal. He had a normal echocardiogram roughly 6 months ago.  PLAN:  Mr. Azer had a few episodes of second-degree AV block Mobitz type I events while sedated.  These events have not recurred since yesterday around 3 PM.   His chest pain is noncardiac.  ECG and biomarkers did not show any changes suggestive of acute coronary insufficiency. No evidence of significant structural cardiac abnormalities by echo recently. No plan for additional cardiac evaluation at this time.  [facility] will sign off.   Medication Recommendations: No cardiac medications prescribed Other recommendations (labs, testing, etc): Per primary team Follow up as an outpatient: With PCP  Thurmon Fair, MD, [facility] [phone] 07/29/2023, 10:56 AM

## 2023-07-29 NOTE — Plan of Care (Signed)
  Problem: Nutrition: Goal: Adequate nutrition will be maintained Outcome: Not Progressing   Problem: Coping: Goal: Level of anxiety will decrease Outcome: Not Progressing   Problem: Pain Managment: Goal: General experience of comfort will improve and/or be controlled Outcome: Not Progressing   Problem: Safety: Goal: Ability to remain free from injury will improve Outcome: Not Progressing

## 2023-07-29 NOTE — TOC Initial Note (Signed)
 Transition of Care [facility]) - Initial/Assessment Note   Patient Details  Name: Cashmere Harmes MRN: [id number] Date of Birth: 21-May-1966  Transition of Care [facility]) CM/SW Contact:    Ewing Schlein, LCSW Phone Number: 07/29/2023, 3:09 PM  Clinical Narrative: [facility] consulted for ETOH use resources. CSW met with patient regarding consult. Patient declined resources stating he has been to [facility] three times and has a sponsor. TOC to clear consult at this time.                 Expected Discharge Plan: Home/Self Care Barriers to Discharge: Continued Medical Work up  Patient Goals and CMS Choice Patient states their goals for this hospitalization and ongoing recovery are:: Discharge home Choice offered to / list presented to : NA  Expected Discharge Plan and Services In-house Referral: Clinical Social Work Post Acute Care Choice: NA Living arrangements for the past 2 months: Single Family Home           DME Arranged: N/A DME Agency: NA  Prior Living Arrangements/Services Living arrangements for the past 2 months: Single Family Home Lives with:: Self Patient language and need for interpreter reviewed:: Yes Do you feel safe going back to the place where you live?: Yes      Need for Family Participation in Patient Care: No (Comment) Care giver support system in place?: Yes (comment) Criminal Activity/Legal Involvement Pertinent to Current Situation/Hospitalization: No - Comment as needed  Activities of Daily Living ADL Screening (condition at time of admission) Independently performs ADLs?: Yes (appropriate for developmental age) Is the patient deaf or have difficulty hearing?: No Does the patient have difficulty seeing, even when wearing glasses/contacts?: No Does the patient have difficulty concentrating, remembering, or making decisions?: No  Emotional Assessment Appearance:: Appears stated age Attitude/Demeanor/Rapport: Guarded Affect (typically observed):  Appropriate Orientation: : Oriented to Self, Oriented to Place, Oriented to  Time, Oriented to Situation Alcohol / Substance Use: Not Applicable Psych Involvement: No (comment)  Admission diagnosis:  Alcohol withdrawal (HCC) [F10.939] Delirium tremens (HCC) [F10.931] Alcohol withdrawal syndrome, with delirium (HCC) [F10.931] Patient Active Problem List   Diagnosis Date Noted   Transaminitis 07/23/2023   Acute hypoxic respiratory failure (HCC) 06/26/2023   C. 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 [place] [address] Phone: [phone] Fax: [phone]  [place] - [facility] [address] [place] [address] Phone: [phone] Fax: [phone]  Social Drivers of Health (SDOH) Social History: SDOH Screenings   Food Insecurity: Patient Declined (07/28/2023)  Housing: Patient Declined (07/28/2023)  Transportation Needs: Patient Declined (07/28/2023)  Utilities: Patient Declined (07/28/2023)  Recent Concern: Utilities - At Risk (05/02/2023)  Tobacco Use: Low Risk  (07/28/2023)   SDOH Interventions:    Readmission Risk Interventions    07/29/2023    3:07 PM 05/13/2023    2:46 PM 01/17/2023    4:03 PM  Readmission Risk Prevention Plan  Transportation Screening Complete Complete  Complete  PCP or Specialist Appt within 5-7 Days   Complete  PCP or Specialist Appt within  3-5 Days  Complete   Home Care Screening   Complete  Medication Review (RN CM)   Complete  HRI or Home Care Consult  Complete   Social Work Consult for Recovery Care Planning/Counseling  Complete   Palliative Care Screening  Not Applicable   Medication Review Ocean[facility]) Complete Complete   HRI or Home Care Consult Complete    SW Recovery Care/Counseling Consult Complete    Palliative Care Screening Not Applicable    Skilled Nursing Facility Not Applicable

## 2023-07-29 NOTE — Progress Notes (Signed)
 [facility]  PROGRESS NOTE  Gwen Edler ZOX:[id number] DOB: 02-Jul-1965 DOA: 07/28/2023 PCP: Patient, No Pcp Per   Brief HPI:   58 y.o. male with medical history significant for severe alcohol abuse, delirium tremens and recurrent hospitalizations for the same as well as history of leaving AMA being admitted to the hospital with complaints of alcohol withdrawal symptoms and vomiting.  He was actually admitted to the hospital 1/28 to 2/14 when he left AMA, was admitted again 2/25 to 3/1 and left AMA on that day.  During his most recent hospital stay, he was admitted to the stepdown unit and started on Precedex drip, required phenobarbital taper, IV Ativan, and required physical restraints.     Assessment/Plan:   58 y.o. male with medical history significant for severe alcohol abuse, delirium tremens and recurrent hospitalizations for the same as well as history of leaving AMA being admitted to the hospital with complaints of alcohol withdrawal symptoms and vomiting.   Alcohol withdrawal/hallucinations/questionable delirium tremens -Patient is currently on Precedex gtt and Ativan per CIWA protocol -Continue thiamine, folic acid -Will consult PCCM for further recommendations  Transaminitis -Secondary to above -LFTs improving  Chest pain -Cardiology consulted for chest pain evaluation     Medications     chlordiazePOXIDE  25 mg Oral TID   Followed by   Melene Muller ON 07/30/2023] chlordiazePOXIDE  25 mg Oral BH-qamhs   Followed by   Melene Muller ON 07/31/2023] chlordiazePOXIDE  25 mg Oral Daily   Chlorhexidine Gluconate Cloth  6 each Topical Daily   enoxaparin (LOVENOX) injection  40 mg Subcutaneous QHS   folic acid  1 mg Oral Daily   mirtazapine  30 mg Oral QHS   multivitamin with minerals  1 tablet Oral Daily   pantoprazole (PROTONIX) IV  40 mg Intravenous Daily   thiamine (VITAMIN B1) injection  100 mg Intravenous Q24H   Or   thiamine  100 mg Oral Q24H     Data Reviewed:    CBG:  No results for input(s): "GLUCAP" in the last 168 hours.  SpO2: 95 % O2 Flow Rate (L/min): 2 L/min    Vitals:   07/29/23 0600 07/29/23 0700 07/29/23 0705 07/29/23 0737  BP: (!) 101/59 91/66  132/79  Pulse: 61 83 75 66  Resp: 19 14 17    Temp:   97.8 F (36.6 C)   TempSrc:   Oral   SpO2: 96% 95% 95%   Weight:      Height:          Data Reviewed:  Basic Metabolic Panel: Recent Labs  Lab 07/23/23 0757 07/24/23 0254 07/26/23 0311 07/27/23 0310 07/28/23 0715 07/29/23 0322  NA 142 141 139 137 141 138  K 4.2 4.2 3.7 3.5 3.8 3.5  CL 99 105 104 101 96* 101  CO2 24 27 23 25 25 28   GLUCOSE 101* 70 94 91 105* 101*  BUN 11 13 11 15 13 20   CREATININE 1.06 0.55* 0.67 0.53* 1.00 0.83  CALCIUM 8.8* 7.8* 8.9 8.5* 9.6 8.8*  MG 1.9  --   --  1.9 1.8  --   PHOS  --   --   --  3.4  --   --     CBC: Recent Labs  Lab 07/23/23 0757 07/24/23 0254 07/26/23 0311 07/27/23 0310 07/28/23 0715 07/29/23 0322  WBC 9.5 4.6 5.5 6.6 14.6* 10.8*  NEUTROABS 5.9  --   --   --  11.9*  --   HGB 17.6* 14.0 15.8  15.0 17.4* 13.7  HCT 53.5* 44.2 48.3 45.6 51.4 42.6  MCV 95.4 98.9 97.6 98.1 94.1 100.0  PLT 303 158 112* 116* 151 106*    LFT Recent Labs  Lab 07/23/23 0757 07/24/23 0254 07/27/23 0310 07/28/23 0715 07/29/23 0322  AST 136* 118* 126* 116* 55*  ALT 124* 105* 164* 146* 87*  ALKPHOS 62 43 62 78 61  BILITOT 0.9 1.3* 1.0 1.1 1.2  PROT 8.3* 6.0* 5.7* 8.0 6.1*  ALBUMIN 4.8 3.5 3.3* 4.5 3.4*     Antibiotics: Anti-infectives (From admission, onward)    None        DVT prophylaxis: Lovenox  Code Status: Full code  Family Communication: No family at bedside   CONSULTS cardiology   Subjective   Continues to have tremors, hallucinations   Objective    Physical Examination:   General-appears in no acute distress Heart-S1-S2, regular, no murmur auscultated Lungs-clear to auscultation bilaterally, no wheezing or crackles auscultated Abdomen-soft,  nontender, no organomegaly Extremities-no edema in the lower extremities Neuro-alert, oriented x3, no focal deficit noted   Status is: Inpatient:             Meredeth Ide   [facility] If 7PM-7AM, please contact night-coverage at [email or web address], Office  [phone]   07/29/2023, 7:44 AM  LOS: 1 day

## 2023-07-29 NOTE — Progress Notes (Signed)
 Restraint belt was discontinued d/t patient repeatedly removing belt and concern that the belt was making him a higher fall risk as he sees it as a challenge to remove. Pt given clear boundaries and confirms his understanding that he must call before attempting to get out of bed. Bed alarm is on and pt is within sight of nursing station.

## 2023-07-29 NOTE — Consult Note (Signed)
 NAME:  Jermaine [name], MRN:  [id number], DOB:  1966-03-03, LOS: 1 ADMISSION DATE:  07/28/2023, CONSULTATION DATE:  07/29/23 REFERRING MD:  Dr. Sharl Ma CHIEF COMPLAINT:  Jermaine [name]   History of Present Illness:  Jermaine [name] is a 58 year old male with history of alcohol abuse/dependence, delirium tremens and recurrent admissions for alcohol withdrawal and leaving AMA who is admitted for alcohol withdrawal initially started on CIWA and librium taper. He required initiation of precedex drip which he has been on for 24 hours. PCCM consulted for severe alcohol withdrawal.   Pertinent  Medical History   Past Medical History:  Diagnosis Date   Alcohol abuse    Anxiety    Class 1 obesity 09/14/2021   Depression    Significant Hospital Events: Including procedures, antibiotic start and stop dates in addition to other pertinent events   3/2 admitted to step down, placed on precedex for alcohol withdrawal  Interim History / Subjective:   Patient is complaining of heart palpitations Denies dyspnea or breathing issues  Objective   Blood pressure 114/72, pulse 72, temperature 97.8 F (36.6 C), temperature source Oral, resp. rate 14, height 5\' 10"  (1.778 m), weight 90.7 kg, SpO2 99%.        Intake/Output Summary (Last 24 hours) at 07/29/2023 1018 Last data filed at 07/29/2023 1027 Gross per 24 hour  Intake 223.57 ml  Output 870 ml  Net -646.43 ml   Filed Weights   07/28/23 0658  Weight: 90.7 kg    Examination: General: middle age male, no acute distress HENT: Jermaine [name]/AT, moist mucous membranes Lungs: clear to auscultation, no wheezing Cardiovascular: rrr, no murmurs Abdomen: soft, non-tender, non-distended Extremities: warm, no edema Neuro: awake, moving all extremities, confused GU: n/a  Resolved Hospital Problem list     Assessment & Plan:  Severe alcohol withdrawal with hx of delirium tremens Alcohol Abuse/Dependence Elevated Liver Enzymes Depression/Anxiety  Plan: - load with  1,000mg  of phenobarbital - Consult pharmacy for high dose phenobarbital dosing - PRN IV dosing for agitation or worsening symptoms - PRN ativan via CIWA if needed - Discontinue librium taper - Recommend psychiatry consult for evaluation of depression and alcohol addiction  Best Practice (right click and "Reselect all SmartList Selections" daily)   Per primary  Labs   CBC: Recent Labs  Lab 07/23/23 0757 07/24/23 0254 07/26/23 0311 07/27/23 0310 07/28/23 0715 07/29/23 0322  WBC 9.5 4.6 5.5 6.6 14.6* 10.8*  NEUTROABS 5.9  --   --   --  11.9*  --   HGB 17.6* 14.0 15.8 15.0 17.4* 13.7  HCT 53.5* 44.2 48.3 45.6 51.4 42.6  MCV 95.4 98.9 97.6 98.1 94.1 100.0  PLT 303 158 112* 116* 151 106*    Basic Metabolic Panel: Recent Labs  Lab 07/23/23 0757 07/24/23 0254 07/26/23 0311 07/27/23 0310 07/28/23 0715 07/29/23 0322  NA 142 141 139 137 141 138  K 4.2 4.2 3.7 3.5 3.8 3.5  CL 99 105 104 101 96* 101  CO2 24 27 23 25 25 28   GLUCOSE 101* 70 94 91 105* 101*  BUN 11 13 11 15 13 20   CREATININE 1.06 0.55* 0.67 0.53* 1.00 0.83  CALCIUM 8.8* 7.8* 8.9 8.5* 9.6 8.8*  MG 1.9  --   --  1.9 1.8  --   PHOS  --   --   --  3.4  --   --    GFR: Estimated Creatinine Clearance: 111.3 mL/min (by C-G formula based on SCr of 0.83 mg/dL).  Recent Labs  Lab 07/26/23 0311 07/27/23 0310 07/28/23 0715 07/29/23 0322  WBC 5.5 6.6 14.6* 10.8*    Liver Function Tests: Recent Labs  Lab 07/23/23 0757 07/24/23 0254 07/27/23 0310 07/28/23 0715 07/29/23 0322  AST 136* 118* 126* 116* 55*  ALT 124* 105* 164* 146* 87*  ALKPHOS 62 43 62 78 61  BILITOT 0.9 1.3* 1.0 1.1 1.2  PROT 8.3* 6.0* 5.7* 8.0 6.1*  ALBUMIN 4.8 3.5 3.3* 4.5 3.4*   Recent Labs  Lab 07/28/23 0715  LIPASE 33   No results for input(s): "AMMONIA" in the last 168 hours.  ABG    Component Value Date/Time   PHART 7.4 01/18/2023 0950   PCO2ART 38 01/18/2023 0950   PO2ART 96 01/18/2023 0950   HCO3 23.5 01/18/2023 0950    ACIDBASEDEF 1.1 01/18/2023 0950   O2SAT 99.5 01/18/2023 0950     Coagulation Profile: No results for input(s): "INR", "PROTIME" in the last 168 hours.  Cardiac Enzymes: Recent Labs  Lab 07/27/23 0310  CKTOTAL 530*    HbA1C: Hgb A1c MFr Bld  Date/Time Value Ref Range Status  01/16/2023 02:57 AM 5.4 4.8 - 5.6 % Final    Comment:    (NOTE) Pre diabetes:          5.7%-6.4%  Diabetes:              >6.4%  Glycemic control for   <7.0% adults with diabetes   03/20/2019 03:46 AM 5.4 4.8 - 5.6 % Final    Comment:    (NOTE) Pre diabetes:          5.7%-6.4% Diabetes:              >6.4% Glycemic control for   <7.0% adults with diabetes     CBG: No results for input(s): "GLUCAP" in the last 168 hours.  Review of Systems:   Review of Systems  Constitutional:  Negative for chills, fever, malaise/fatigue and weight loss.  HENT:  Negative for congestion, sinus pain and sore throat.   Eyes: Negative.   Respiratory:  Negative for cough, hemoptysis, sputum production, shortness of breath and wheezing.   Cardiovascular:  Positive for chest pain and palpitations. Negative for orthopnea, claudication and leg swelling.  Gastrointestinal:  Negative for abdominal pain, heartburn, nausea and vomiting.  Genitourinary: Negative.   Musculoskeletal:  Negative for joint pain and myalgias.  Skin:  Negative for rash.  Neurological:  Negative for weakness.  Endo/Heme/Allergies: Negative.   Psychiatric/Behavioral: Negative.     Past Medical History:  He,  has a past medical history of Alcohol abuse, Anxiety, Class 1 obesity (09/14/2021), and Depression.   Surgical History:   Past Surgical History:  Procedure Laterality Date   HERNIA REPAIR       Social History:   reports that he has never smoked. He has never been exposed to tobacco smoke. He has never used smokeless tobacco. He reports current alcohol use of about 20.0 standard drinks of alcohol per week. He reports that he does not  currently use drugs.   Family History:  His family history is not on file. He was adopted.   Allergies Allergies  Allergen Reactions   Ketamine     Hx of causing fever in 2024    Codeine Itching     Home Medications  Prior to Admission medications   Medication Sig Start Date End Date Taking? Authorizing Provider  folic acid (FOLVITE) 1 MG tablet Take 1 tablet (1 mg total) by mouth  daily. 01/28/23  Yes Danford, Earl Lites, MD  hydrOXYzine (ATARAX) 25 MG tablet Take 50 mg by mouth every 8 (eight) hours as needed for anxiety. 06/10/23  Yes [provider]  mirtazapine (REMERON) 30 MG tablet Take 30 mg by mouth at bedtime.   Yes [provider]  thiamine (VITAMIN B-1) 100 MG tablet Take 1 tablet (100 mg total) by mouth daily. 05/20/22  Yes [name], MD     Critical care time: 40 minutes    Melody Comas, MD [facility] Office: [phone]   See Amion for personal pager PCCM on call pager [phone] until 7pm. Please call Elink 7p-7a. [phone]

## 2023-07-30 ENCOUNTER — Encounter (HOSPITAL_COMMUNITY): Payer: Self-pay | Admitting: Internal Medicine

## 2023-07-30 DIAGNOSIS — R7401 Elevation of levels of liver transaminase levels: Secondary | ICD-10-CM | POA: Diagnosis not present

## 2023-07-30 DIAGNOSIS — F419 Anxiety disorder, unspecified: Secondary | ICD-10-CM

## 2023-07-30 DIAGNOSIS — F32A Depression, unspecified: Secondary | ICD-10-CM

## 2023-07-30 DIAGNOSIS — F339 Major depressive disorder, recurrent, unspecified: Secondary | ICD-10-CM | POA: Diagnosis not present

## 2023-07-30 DIAGNOSIS — F10931 Alcohol use, unspecified with withdrawal delirium: Secondary | ICD-10-CM | POA: Diagnosis not present

## 2023-07-30 LAB — COMPREHENSIVE METABOLIC PANEL
ALT: 72 U/L — ABNORMAL HIGH (ref 0–44)
AST: 44 U/L — ABNORMAL HIGH (ref 15–41)
Albumin: 3.4 g/dL — ABNORMAL LOW (ref 3.5–5.0)
Alkaline Phosphatase: 65 U/L (ref 38–126)
Anion gap: 11 (ref 5–15)
BUN: 17 mg/dL (ref 6–20)
CO2: 27 mmol/L (ref 22–32)
Calcium: 8.3 mg/dL — ABNORMAL LOW (ref 8.9–10.3)
Chloride: 99 mmol/L (ref 98–111)
Creatinine, Ser: 0.74 mg/dL (ref 0.61–1.24)
GFR, Estimated: >60 mL/min (ref >60)
Glucose, Bld: 130 mg/dL — ABNORMAL HIGH (ref 70–99)
Potassium: 3.2 mmol/L — ABNORMAL LOW (ref 3.5–5.1)
Sodium: 137 mmol/L (ref 135–145)
Total Bilirubin: 0.7 mg/dL (ref 0.0–1.2)
Total Protein: 6 g/dL — ABNORMAL LOW (ref 6.5–8.1)

## 2023-07-30 MED ORDER — SODIUM CHLORIDE 0.9% FLUSH: 10.0000 mL | INTRAVENOUS | Status: DC | PRN

## 2023-07-30 MED ORDER — POTASSIUM CHLORIDE CRYS ER 20 MEQ PO TBCR
40.0000 meq | EXTENDED_RELEASE_TABLET | Freq: Once | ORAL | Status: AC
  Administered 2023-07-30: 40 meq via ORAL
  Filled 2023-07-30: qty 2

## 2023-07-30 MED ORDER — PHENOBARBITAL SODIUM 130 MG/ML IJ SOLN
130.0000 mg | Freq: Once | INTRAMUSCULAR | Status: AC
  Administered 2023-07-30: 130 mg via INTRAVENOUS
  Filled 2023-07-30: qty 1

## 2023-07-30 NOTE — Progress Notes (Signed)
 NAME:  Jermaine [name], MRN:  [id number], DOB:  1965/10/27, LOS: 2 ADMISSION DATE:  07/28/2023, CONSULTATION DATE:  3/3 REFERRING MD:  Sharl Ma, CHIEF COMPLAINT:  Alcohol withdrawal   History of Present Illness:  [name] is a 58 year old male with history of alcohol abuse/dependence, delirium tremens and recurrent admissions for alcohol withdrawal and leaving AMA who is admitted for alcohol withdrawal initially started on CIWA and librium taper. He required initiation of precedex drip which he has been on for 24 hours. PCCM consulted for severe alcohol withdrawal.   Pertinent  Medical History  Alcohol use disorder, anxiety, depression   Significant Hospital Events: Including procedures, antibiotic start and stop dates in addition to other pertinent events   3/2: admit SDU, precedex gtt for etoh w/d 3/3: phenobarb 1g load then taper, dc librium taper  3/4: agitation this AM increased hallucinations requiring IV dose of phenobarb  Interim History / Subjective:  Awake and alert this morning. Hallucinating. Thinks there's a big monster in the room. Reoriented. Tremulous. Not diaphoretic.   Objective   Blood pressure (!) 124/31, pulse 94, temperature (!) 97.3 F (36.3 C), temperature source Axillary, resp. rate 12, height 5\' 10"  (1.778 m), weight 90.7 kg, SpO2 98%.        Intake/Output Summary (Last 24 hours) at 07/30/2023 0915 Last data filed at 07/30/2023 0510 Gross per 24 hour  Intake 350.59 ml  Output 750 ml  Net -399.41 ml   Filed Weights   07/28/23 0658  Weight: 90.7 kg    Examination: General: middle aged male, laying in bed, tremulous HENT: Felt/at, mucous membranes moist, perrla  Lungs: clear bilateral, resp even and unlabored Cardiovascular: s1s2, rrr, no murmur, rub, gallop Abdomen: rounded, soft, ntnd Extremities: no pitting edema Neuro: awake, alert, disoriented to time, hallucinating, tremulous. Moves all extremities. No focal deficit GU: no foley  Resolved Hospital  Problem list    Assessment & Plan:  Alcohol withdrawal  History of delirium tremens  Elevated transaminases  Depression/Anxiety   - single dose 130mg  IV phenobarbital for agitation this AM  - con't oral phenobarb taper  - thiamin, folic acid, mtvn  - prn ativan and hydroxyzine for agitation  - CIWA assessments  - safety sitter   Best Practice (right click and "Reselect all SmartList Selections" daily)   Per primary team   Labs   CBC: Recent Labs  Lab 07/24/23 0254 07/26/23 0311 07/27/23 0310 07/28/23 0715 07/29/23 0322  WBC 4.6 5.5 6.6 14.6* 10.8*  NEUTROABS  --   --   --  11.9*  --   HGB 14.0 15.8 15.0 17.4* 13.7  HCT 44.2 48.3 45.6 51.4 42.6  MCV 98.9 97.6 98.1 94.1 100.0  PLT 158 112* 116* 151 106*    Basic Metabolic Panel: Recent Labs  Lab 07/26/23 0311 07/27/23 0310 07/28/23 0715 07/29/23 0322 07/30/23 0251  NA 139 137 141 138 137  K 3.7 3.5 3.8 3.5 3.2*  CL 104 101 96* 101 99  CO2 23 25 25 28 27   GLUCOSE 94 91 105* 101* 130*  BUN 11 15 13 20 17   CREATININE 0.67 0.53* 1.00 0.83 0.74  CALCIUM 8.9 8.5* 9.6 8.8* 8.3*  MG  --  1.9 1.8  --   --   PHOS  --  3.4  --   --   --    GFR: Estimated Creatinine Clearance: 115.4 mL/min (by C-G formula based on SCr of 0.74 mg/dL). Recent Labs  Lab 07/26/23 [phone] 07/27/23 0310  07/28/23 0715 07/29/23 0322  WBC 5.5 6.6 14.6* 10.8*    Liver Function Tests: Recent Labs  Lab 07/24/23 0254 07/27/23 0310 07/28/23 0715 07/29/23 0322 07/30/23 0251  AST 118* 126* 116* 55* 44*  ALT 105* 164* 146* 87* 72*  ALKPHOS 43 62 78 61 65  BILITOT 1.3* 1.0 1.1 1.2 0.7  PROT 6.0* 5.7* 8.0 6.1* 6.0*  ALBUMIN 3.5 3.3* 4.5 3.4* 3.4*   Recent Labs  Lab 07/28/23 0715  LIPASE 33   No results for input(s): "AMMONIA" in the last 168 hours.  ABG    Component Value Date/Time   PHART 7.4 01/18/2023 0950   PCO2ART 38 01/18/2023 0950   PO2ART 96 01/18/2023 0950   HCO3 23.5 01/18/2023 0950   ACIDBASEDEF 1.1 01/18/2023  0950   O2SAT 99.5 01/18/2023 0950     Coagulation Profile: No results for input(s): "INR", "PROTIME" in the last 168 hours.  Cardiac Enzymes: Recent Labs  Lab 07/27/23 0310  CKTOTAL 530*    HbA1C: Hgb A1c MFr Bld  Date/Time Value Ref Range Status  01/16/2023 02:57 AM 5.4 4.8 - 5.6 % Final    Comment:    (NOTE) Pre diabetes:          5.7%-6.4%  Diabetes:              >6.4%  Glycemic control for   <7.0% adults with diabetes   03/20/2019 03:46 AM 5.4 4.8 - 5.6 % Final    Comment:    (NOTE) Pre diabetes:          5.7%-6.4% Diabetes:              >6.4% Glycemic control for   <7.0% adults with diabetes     CBG: No results for input(s): "GLUCAP" in the last 168 hours.  Review of Systems:   As above  Past Medical History:  He,  has a past medical history of Alcohol abuse, Anxiety, Class 1 obesity (09/14/2021), and Depression.   Surgical History:   Past Surgical History:  Procedure Laterality Date   HERNIA REPAIR       Social History:   reports that he has never smoked. He has never been exposed to tobacco smoke. He has never used smokeless tobacco. He reports current alcohol use of about 20.0 standard drinks of alcohol per week. He reports that he does not currently use drugs.   Family History:  His family history is not on file. He was adopted.   Allergies Allergies  Allergen Reactions   Ketamine     Hx of causing fever in 2024    Codeine Itching     Home Medications  Prior to Admission medications   Medication Sig Start Date End Date Taking? Authorizing Provider  folic acid (FOLVITE) 1 MG tablet Take 1 tablet (1 mg total) by mouth daily. 01/28/23  Yes Danford, Earl Lites, MD  hydrOXYzine (ATARAX) 25 MG tablet Take 50 mg by mouth every 8 (eight) hours as needed for anxiety. 06/10/23  Yes [provider]  mirtazapine (REMERON) 30 MG tablet Take 30 mg by mouth at bedtime.   Yes [provider]  thiamine (VITAMIN B-1) 100 MG tablet Take  1 tablet (100 mg total) by mouth daily. 05/20/22  Yes [name], MD     Critical care time: 37   Lenard Galloway [facility] 07/30/23 9:25 AM  Please see [email or web address] for pager details.  From 7A-7P if no response, please call [phone]  After hours, please call ELink [phone]

## 2023-07-30 NOTE — Progress Notes (Signed)
 Afternoon round: stable today. Complains intermittently of palpitations and generalized body aches. On phenobarb taper and has received 16mg  Ativan on shift for withdrawal symptoms. Expected given we are a little over 2 days since admission. Anticipate ongoing symptoms for the next few days until improving.

## 2023-07-30 NOTE — Consult Note (Signed)
 [facility] Psychiatric Consult Initial  Patient Name: .Jermaine [name]  MRN: [id number]  DOB: 03/18/1966  Consult Order details:  Orders (From admission, onward)     Start     Ordered   07/30/23 0740  IP CONSULT TO PSYCHIATRY       Ordering Provider: Meredeth Ide, MD  Provider:  (Not yet assigned)  Question Answer Comment  Location [facility]   Reason for Consult? Depression, alcohol use disorder      07/30/23 0739             Mode of Visit: In person    Psychiatry Consult Evaluation  Service Date: July 30, 2023 LOS:  LOS: 2 days  Chief Complaint Alcohol intoxication with Alcohol withdrawal  Primary Psychiatric Diagnoses  Alcohol withdrawal 2.  Recurrent Major Depressive disorder 3.  Alcohol use Disorder  Assessment  Jermaine [name] is a 58 y.o. male admitted: Presented to the EDfor 07/28/2023  6:53 AM for Alcohol withdrwal. He carries the psychiatric diagnoses of Alcohol use disorder,DT, Alcohol withdrawal Seizures, Depression, anxiety, and has a past medical history of  Hepatic Steatosis, Sinus Tachycardia.   His current presentation of tremor, agitation is most consistent with alcohol withdrawal symptoms and intoxication. He meets criteria for inpatient alcohol detox treatment after stabilization in ICU based on presentation.  Current outpatient psychotropic medications include Remeron and historically he has had a unknown response to these medications. He was not compliant with medications prior to admission as evidenced by his report. On initial examination, patient was still shaky, unsteady on his feet . Please see plan below for detailed recommendations.   Diagnoses:  Active Hospital problems: Principal Problem:   Alcohol withdrawal (HCC) Active Problems:   Alcohol abuse with intoxication (HCC)    Plan   ## Psychiatric Medication Recommendations:  Remeron 30 mg po daily for depression  ## Medical Decision Making Capacity: Not specifically  addressed in this encounter  ## Further Work-up:  - most recent EKG on 07/28/2023 had QtC of 483 -- Pertinent labwork reviewed earlier this admission includes: TSH CMP   ## Disposition:-- We recommend inpatient psychiatric hospitalization when medically cleared. Patient is under voluntary admission status at this time; please IVC if attempts to leave hospital.  ## Behavioral / Environmental: - No specific recommendations at this time.     ## Safety and Observation Level:  - Based on my clinical evaluation, I estimate the patient to be at Low risk of self harm in the current setting. - At this time, we recommend  routine. This decision is based on my review of the chart including patient's history and current presentation, interview of the patient, mental status examination, and consideration of suicide risk including evaluating suicidal ideation, plan, intent, suicidal or self-harm behaviors, risk factors, and protective factors. This judgment is based on our ability to directly address suicide risk, implement suicide prevention strategies, and develop a safety plan while the patient is in the clinical setting. Please contact our team if there is a concern that risk level has changed.  CSSR Risk Category:C-SSRS RISK CATEGORY: No Risk  Suicide Risk Assessment: Patient has following modifiable risk factors for suicide: untreated depression, under treated depression , recklessness, medication noncompliance, and lack of access to outpatient mental health resources, which we are addressing by recommending inpatient Psychiatry hospitalization. Patient has following non-modifiable or demographic risk factors for suicide: male gender and psychiatric hospitalization Patient has the following protective factors against suicide: Access to outpatient mental health care,  Supportive family, no history of suicide attempts, and no history of NSSIB  Thank you for this consult request. Recommendations have been  communicated to the primary team.  We will continue to round on patient daily at this time.   Earney Navy, NP-PMHNP-BC       History of Present Illness  Relevant Aspects of Hospital ED Course:  Admitted on 07/28/2023 for Alcohol abuse, Delirium tremens   Patient is a 58 years old male seen today in ICU receiving treatment for Alcohol withdrawal symptoms -DT, Delirium.  Chart is reviewed and patient discussed with DR Viviano Simas, Psychiatrist.  Patient is well known to [facility] long ER and Medical units.  Patient  has previous hx of Depression, anxiety and Alcohol use disorder.  Patient was admitted to ICU on the second of March for Medical detoxification.  Patient signed himself out of the hospital two days prior to this visit.  He came back again intoxicated and complained of withdrawal symptoms.   Psychiatry was consulted to see patient although he is still experiencing withdrawal symptoms and remains on Phenobarb taper.  He was seen in his room as he walked in from walking with PT staff.  His gait is unsteady and he had mild tremors of his fingers.  He engaged in meaningful conversation.  Patient admitted having issues with Alcoholism stating he started drinking alcohol at age 72.  He also admitted been to rehab three times and really did well in the program but relapses after a month or less of leaving the facility.  He reports using Alcohol to treat depression and loneliness.  He reports using alcohol to cure loneliness and when he bored.  He reports that he is now ready to stop drinking alcohol.  His choice of Alcohol is Liquor  which he drinks half a pint a day.  Patient is not currently taking is Remeron for depression and states he uses Alcohol to treat himself.  He denies SI/HI/AVH.  Patient declined offer to be admitted to inpatient Psychiatry unit  for further detox treatment and treatment of depression.  Patient states he has done every treatment in the past but the problem is that he relapses  and does not follow through.  He has no outpatient Psychiatrist but his PCP prescribed his Remeron for him. Patient is still on Phenobarb taper and receiving Ativan for withdrawal as well.  He is still in need for advanced treatment in ICU Psychiatry will continue to see patient until he stable to be discharged home or come to Psychiatry unit.   Psych ROS:  Depression: yes Anxiety:  yes Mania (lifetime and current): na Psychosis: (lifetime and current): na  Collateral information:  Patient does not want his mother or sister called for collateral.  Review of Systems  Constitutional: Negative.   HENT: Negative.    Eyes: Negative.   Respiratory: Negative.    Cardiovascular: Negative.   Gastrointestinal:  Positive for nausea.  Genitourinary: Negative.   Musculoskeletal: Negative.   Skin: Negative.   Neurological: Negative.   Endo/Heme/Allergies: Negative.   Psychiatric/Behavioral:  Positive for depression. The patient is nervous/anxious.      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  Fellowship hall x 3 times  Exam Findings  Physical Exam:  Vital Signs:  Temp:  [97.3 F (36.3 C)-99.5 F (37.5 C)] 97.3 F (36.3 C) (03/04 0800) Pulse Rate:  [66-124] 90 (03/04 1400) Resp:  [11-27] 23 (03/04 1200) BP: (84-129)/(31-87) 100/66 (03/04 1400) SpO2:  [94 %-100 %] 96 % (03/04 1300) Blood pressure 100/66, pulse 90, temperature (!) 97.3 F (36.3 C), temperature source Axillary, resp. rate (!) 23, height 5\' 10"  (1.778 m), weight 90.7 kg, SpO2 96%. Body mass index is 28.7 kg/m.  Physical Exam Constitutional:      Appearance: Normal appearance.  HENT:     Nose: Nose normal.  Cardiovascular:     Rate and Rhythm: Normal rate and regular rhythm.  Pulmonary:     Effort: Pulmonary effort is normal.  Musculoskeletal:        General: Normal range of motion.     Comments: Unsteady gait, requires PT walking him and close monitoring.  Skin:    General: Skin is dry.  Neurological:     Mental Status: He is alert and oriented to person, place, and time.  Psychiatric:        Attention and Perception: He is inattentive.        Mood and Affect: Mood is anxious and depressed.        Speech: Speech normal.        Behavior: Behavior normal. Behavior is cooperative.        Thought Content: Thought content normal.        Cognition and Memory: Cognition is impaired.        Judgment: Judgment is impulsive.     Mental Status Exam: General Appearance: Disheveled and Unkempt  Orientation:  Full (Time, Place, and Person)  Memory:  Immediate;   Fair Recent;   Fair Remote;   Fair  Concentration:  Concentration: Poor and Attention Span: Poor  Recall:  Fair  Attention  Fair  Eye Contact:  Fair  Speech:  Normal Rate  Language:  Fair  Volume:  Normal  Mood: "Depressed, angry cannot stop drinking Alcohol"  Affect:  Congruent  Thought Process:  Coherent and Linear  Thought Content:  Logical  Suicidal Thoughts:  No  Homicidal Thoughts:  No  Judgement:  Impaired   Insight:  Shallow  Psychomotor Activity:  Tremor  Akathisia:  NA  Fund of Knowledge:  Fair      Assets:  Manufacturing systems engineer Desire for Improvement Housing  Cognition:  Impaired,  Moderate  ADL's:  Impaired  AIMS (if indicated):        Other History   These have been pulled in through the EMR, reviewed, and updated if appropriate.  Family History:  The patient's family history is not on file. He was adopted.  Medical History: Past Medical History:  Diagnosis Date   Alcohol abuse    Anxiety    Class 1 obesity 09/14/2021   Depression     Surgical History: Past Surgical History:  Procedure Laterality Date  HERNIA REPAIR       Medications:   Current Facility-Administered Medications:    albuterol (PROVENTIL) (2.5 MG/3ML) 0.083% nebulizer solution 2.5 mg, 2.5 mg, Nebulization, Q2H PRN, Kirby Crigler, Mir M, MD   Chlorhexidine Gluconate Cloth 2 % PADS 6 each, 6 each, Topical, Daily, Kirby Crigler, Mir M, MD, 6 each at 07/30/23 1009   enoxaparin (LOVENOX) injection 40 mg, 40 mg, Subcutaneous, QHS, Kirby Crigler, Mir M, MD, 40 mg at 07/29/23 2113   folic acid (FOLVITE) tablet 1 mg, 1 mg, Oral, Daily, Kirby Crigler, Mir M, MD, 1 mg at 07/30/23 1009   hydrOXYzine (ATARAX) tablet 25 mg, 25 mg, Oral, Q6H PRN, Kirby Crigler, Mir M, MD   ibuprofen (ADVIL) tablet 400 mg, 400 mg, Oral, Q6H PRN, Kirby Crigler, Mir M, MD, 400 mg at 07/29/23 1701   loperamide (IMODIUM) capsule 2-4 mg, 2-4 mg, Oral, PRN, Kirby Crigler, Mir M, MD   LORazepam (ATIVAN) injection 1-4 mg, 1-4 mg, Intravenous, Q1H PRN, Kirby Crigler, Mir M, MD, 4 mg at 07/30/23 1458   mirtazapine (REMERON) tablet 30 mg, 30 mg, Oral, QHS, Kirby Crigler, Mir M, MD, 30 mg at 07/29/23 2113   multivitamin with minerals tablet 1 tablet, 1 tablet, Oral, Daily, Kirby Crigler, Mir M, MD, 1 tablet at 07/30/23 1009   ondansetron (ZOFRAN) tablet 4 mg, 4 mg, Oral, Q6H PRN **OR** ondansetron (ZOFRAN) injection 4 mg, 4 mg, Intravenous, Q6H PRN, Kirby Crigler, Mir M, MD    Oral care mouth rinse, 15 mL, Mouth Rinse, PRN, Kirby Crigler, Mir M, MD   pantoprazole (PROTONIX) EC tablet 40 mg, 40 mg, Oral, Daily, Shade, Christine E, RPH, 40 mg at 07/30/23 1009   PHENobarbital (LUMINAL) tablet 97.2 mg, 97.2 mg, Oral, Q8H, 97.2 mg at 07/30/23 1359 **FOLLOWED BY** [START ON 07/31/2023] PHENobarbital (LUMINAL) tablet 64.8 mg, 64.8 mg, Oral, Q8H **FOLLOWED BY** [START ON 08/02/2023] PHENobarbital (LUMINAL) tablet 32.4 mg, 32.4 mg, Oral, Q8H, Shade, Christine E, RPH   thiamine (VITAMIN B1) injection 100 mg, 100 mg, Intravenous, Q24H, 100 mg at 07/28/23 1506 **OR** thiamine (VITAMIN B1) tablet 100 mg, 100 mg, Oral, Q24H, Kirby Crigler, Mir M, MD, 100 mg at 07/29/23 1623  Allergies: Allergies  Allergen Reactions   Ketamine     Hx of causing fever in 2024    Codeine Itching    Earney Navy, NP-PMHNP-BC

## 2023-07-30 NOTE — Plan of Care (Signed)
  Problem: Safety: Goal: Non-violent Restraint(s) Outcome: Not Progressing   Problem: Education: Goal: Knowledge of General Education information will improve Description: Including pain rating scale, medication(s)/side effects and non-pharmacologic comfort measures Outcome: Not Progressing   Problem: Health Behavior/Discharge Planning: Goal: Ability to manage health-related needs will improve Outcome: Not Progressing   Problem: Coping: Goal: Level of anxiety will decrease Outcome: Not Progressing   Problem: Safety: Goal: Ability to remain free from injury will improve Outcome: Not Progressing

## 2023-07-30 NOTE — Progress Notes (Signed)
 [facility]  PROGRESS NOTE  Jermaine [name] ZOX:[id number] DOB: 1966-05-09 DOA: 07/28/2023 PCP: Patient, No Pcp Per   Brief HPI:   58 y.o. male with medical history significant for severe alcohol abuse, delirium tremens and recurrent hospitalizations for the same as well as history of leaving AMA being admitted to the hospital with complaints of alcohol withdrawal symptoms and vomiting.  He was actually admitted to the hospital 1/28 to 2/14 when he left AMA, was admitted again 2/25 to 3/1 and left AMA on that day.  During his most recent hospital stay, he was admitted to the stepdown unit and started on Precedex drip, required phenobarbital taper, IV Ativan, and required physical restraints.     Assessment/Plan:   58 y.o. male with medical history significant for severe alcohol abuse, delirium tremens and recurrent hospitalizations for the same as well as history of leaving AMA being admitted to the hospital with complaints of alcohol withdrawal symptoms and vomiting.   Alcohol withdrawal/hallucinations/questionable delirium tremens -Was started on Precedex, currently weaned off -Continue IV phenobarbital and Ativan per CIWA protocol -Continue thiamine, folic acid -Will consult PCCM for further recommendations  Hypokalemia -Replace potassium and follow BMP in am  Transaminitis -Secondary to above -LFTs improving  Chest pain -Cardiology consulted for chest pain evaluation -Noncardiac chest pain, no cardiac workup recommended     Medications     Chlorhexidine Gluconate Cloth  6 each Topical Daily   enoxaparin (LOVENOX) injection  40 mg Subcutaneous QHS   folic acid  1 mg Oral Daily   mirtazapine  30 mg Oral QHS   multivitamin with minerals  1 tablet Oral Daily   pantoprazole  40 mg Oral Daily   PHENObarbital  130 mg Intravenous Once   phenobarbital  97.2 mg Oral Q8H   Followed by   Melene Muller ON 07/31/2023] phenobarbital  64.8 mg Oral Q8H   Followed by   Melene Muller ON 08/02/2023]  phenobarbital  32.4 mg Oral Q8H   thiamine (VITAMIN B1) injection  100 mg Intravenous Q24H   Or   thiamine  100 mg Oral Q24H     Data Reviewed:   CBG:  No results for input(s): "GLUCAP" in the last 168 hours.  SpO2: 94 % O2 Flow Rate (L/min): 2 L/min    Vitals:   07/30/23 0402 07/30/23 0507 07/30/23 0600 07/30/23 0702  BP: (!) 90/56 (!) 84/56 (!) 89/59 108/74  Pulse: 72 66 70 77  Resp: (!) 25 20 20 16   Temp: (!) 97.5 F (36.4 C)     TempSrc: Axillary     SpO2: 94% 95% 95% 94%  Weight:      Height:          Data Reviewed:  Basic Metabolic Panel: Recent Labs  Lab 07/23/23 0757 07/24/23 0254 07/26/23 0311 07/27/23 0310 07/28/23 0715 07/29/23 0322 07/30/23 0251  NA 142   < > 139 137 141 138 137  K 4.2   < > 3.7 3.5 3.8 3.5 3.2*  CL 99   < > 104 101 96* 101 99  CO2 24   < > 23 25 25 28 27   GLUCOSE 101*   < > 94 91 105* 101* 130*  BUN 11   < > 11 15 13 20 17   CREATININE 1.06   < > 0.67 0.53* 1.00 0.83 0.74  CALCIUM 8.8*   < > 8.9 8.5* 9.6 8.8* 8.3*  MG 1.9  --   --  1.9 1.8  --   --  PHOS  --   --   --  3.4  --   --   --    < > = values in this interval not displayed.    CBC: Recent Labs  Lab 07/23/23 0757 07/24/23 0254 07/26/23 0311 07/27/23 0310 07/28/23 0715 07/29/23 0322  WBC 9.5 4.6 5.5 6.6 14.6* 10.8*  NEUTROABS 5.9  --   --   --  11.9*  --   HGB 17.6* 14.0 15.8 15.0 17.4* 13.7  HCT 53.5* 44.2 48.3 45.6 51.4 42.6  MCV 95.4 98.9 97.6 98.1 94.1 100.0  PLT 303 158 112* 116* 151 106*    LFT Recent Labs  Lab 07/24/23 0254 07/27/23 0310 07/28/23 0715 07/29/23 0322 07/30/23 0251  AST 118* 126* 116* 55* 44*  ALT 105* 164* 146* 87* 72*  ALKPHOS 43 62 78 61 65  BILITOT 1.3* 1.0 1.1 1.2 0.7  PROT 6.0* 5.7* 8.0 6.1* 6.0*  ALBUMIN 3.5 3.3* 4.5 3.4* 3.4*     Antibiotics: Anti-infectives (From admission, onward)    None        DVT prophylaxis: Lovenox  Code Status: Full code  Family Communication: No family at  bedside   CONSULTS cardiology   Subjective   Has been confused, having hallucinations.   Objective    Physical Examination:  Appears lethargic S1-S2, regular Lungs clear auscultation bilaterally    Status is: Inpatient:             Meredeth Ide   [facility] If 7PM-7AM, please contact night-coverage at [email or web address], Office  [phone]   07/30/2023, 7:39 AM  LOS: 2 days

## 2023-07-31 DIAGNOSIS — F331 Major depressive disorder, recurrent, moderate: Secondary | ICD-10-CM | POA: Diagnosis not present

## 2023-07-31 DIAGNOSIS — F10931 Alcohol use, unspecified with withdrawal delirium: Secondary | ICD-10-CM | POA: Diagnosis not present

## 2023-07-31 DIAGNOSIS — F32A Depression, unspecified: Secondary | ICD-10-CM | POA: Diagnosis not present

## 2023-07-31 DIAGNOSIS — R7401 Elevation of levels of liver transaminase levels: Secondary | ICD-10-CM | POA: Diagnosis not present

## 2023-07-31 DIAGNOSIS — F419 Anxiety disorder, unspecified: Secondary | ICD-10-CM | POA: Diagnosis not present

## 2023-07-31 LAB — COMPREHENSIVE METABOLIC PANEL
ALT: 61 U/L — ABNORMAL HIGH (ref 0–44)
AST: 35 U/L (ref 15–41)
Albumin: 3.5 g/dL (ref 3.5–5.0)
Alkaline Phosphatase: 53 U/L (ref 38–126)
Anion gap: 9 (ref 5–15)
BUN: 10 mg/dL (ref 6–20)
CO2: 27 mmol/L (ref 22–32)
Calcium: 8.7 mg/dL — ABNORMAL LOW (ref 8.9–10.3)
Chloride: 103 mmol/L (ref 98–111)
Creatinine, Ser: 0.79 mg/dL (ref 0.61–1.24)
GFR, Estimated: >60 mL/min (ref >60)
Glucose, Bld: 90 mg/dL (ref 70–99)
Potassium: 3.6 mmol/L (ref 3.5–5.1)
Sodium: 139 mmol/L (ref 135–145)
Total Bilirubin: 1.2 mg/dL (ref 0.0–1.2)
Total Protein: 6.3 g/dL — ABNORMAL LOW (ref 6.5–8.1)

## 2023-07-31 MED ORDER — HALOPERIDOL 0.5 MG PO TABS
2.0000 mg | ORAL_TABLET | Freq: Three times a day (TID) | ORAL | Status: DC | PRN
  Administered 2023-08-01: 2 mg via ORAL
  Filled 2023-07-31: qty 4

## 2023-07-31 MED ORDER — HALOPERIDOL LACTATE 5 MG/ML IJ SOLN: 5.0000 mg | Freq: Three times a day (TID) | INTRAMUSCULAR | Status: DC | PRN

## 2023-07-31 NOTE — Progress Notes (Signed)
 NAME:  Jermaine [name], MRN:  [id number], DOB:  1965/06/10, LOS: 3 ADMISSION DATE:  07/28/2023, CONSULTATION DATE:  3/3 REFERRING MD:  Sharl Ma, CHIEF COMPLAINT:  Alcohol withdrawal   History of Present Illness:  Jermaine [name] is a 58 year old male with history of alcohol abuse/dependence, delirium tremens and recurrent admissions for alcohol withdrawal and leaving AMA who is admitted for alcohol withdrawal initially started on CIWA and librium taper. He required initiation of precedex drip which he has been on for 24 hours. PCCM consulted for severe alcohol withdrawal.   Pertinent  Medical History  Alcohol use disorder, anxiety, depression   Significant Hospital Events: Including procedures, antibiotic start and stop dates in addition to other pertinent events   3/2: admit SDU, precedex gtt for etoh w/d 3/3: phenobarb 1g load then taper, dc librium taper  3/4: agitation this AM increased hallucinations requiring IV dose of phenobarb 3/5: psych eval yesterday, rec inpt treatment. If tries to leave - IVC   Interim History / Subjective:  Asleep this morning on exam, responds to sternal rub. Got 10mg  Ativan overnight for withdrawal symptoms. Also had some desaturations and on 2LNC. I think that is sleep apnea related.   Objective   Blood pressure 100/73, pulse 66, temperature 97.8 F (36.6 C), temperature source Oral, resp. rate 19, height 5\' 10"  (1.778 m), weight 90.7 kg, SpO2 98%.        Intake/Output Summary (Last 24 hours) at 07/31/2023 7829 Last data filed at 07/30/2023 2300 Gross per 24 hour  Intake 315 ml  Output --  Net 315 ml   Filed Weights   07/28/23 0658  Weight: 90.7 kg    Examination: General: middle aged male, laying in bed, sleeping  HENT: [place]/at, mucous membranes moist, perrla  Lungs: clear bilateral, resp even and unlabored Cardiovascular: s1s2, rrr, no murmur, rub, gallop Abdomen: rounded, soft, ntnd Extremities: no pitting edema Neuro: sleeping, somewhat sedated  from overnight. Responds to sternal rub.  GU: no foley  Resolved Hospital Problem list    Assessment & Plan:  Alcohol withdrawal  History of delirium tremens  Elevated transaminases  Depression/Anxiety  - con't oral phenobarb taper  - thiamin, folic acid, mtvn  - prn ativan and hydroxyzine for agitation  - CIWA assessments  - safety sitter  - seen by psychiatry on 3/4 who recommended inpatient psychiatric hospitalization after medically cleared. Additionally, if attempts to leave, patient should be IVC'd   Best Practice (right click and "Reselect all SmartList Selections" daily)   Per primary team   Labs   CBC: Recent Labs  Lab 07/26/23 0311 07/27/23 0310 07/28/23 0715 07/29/23 0322  WBC 5.5 6.6 14.6* 10.8*  NEUTROABS  --   --  11.9*  --   HGB 15.8 15.0 17.4* 13.7  HCT 48.3 45.6 51.4 42.6  MCV 97.6 98.1 94.1 100.0  PLT 112* 116* 151 106*    Basic Metabolic Panel: Recent Labs  Lab 07/26/23 0311 07/27/23 0310 07/28/23 0715 07/29/23 0322 07/30/23 0251  NA 139 137 141 138 137  K 3.7 3.5 3.8 3.5 3.2*  CL 104 101 96* 101 99  CO2 23 25 25 28 27   GLUCOSE 94 91 105* 101* 130*  BUN 11 15 13 20 17   CREATININE 0.67 0.53* 1.00 0.83 0.74  CALCIUM 8.9 8.5* 9.6 8.8* 8.3*  MG  --  1.9 1.8  --   --   PHOS  --  3.4  --   --   --  GFR: Estimated Creatinine Clearance: 115.4 mL/min (by C-G formula based on SCr of 0.74 mg/dL). Recent Labs  Lab 07/26/23 0311 07/27/23 0310 07/28/23 0715 07/29/23 0322  WBC 5.5 6.6 14.6* 10.8*    Liver Function Tests: Recent Labs  Lab 07/27/23 0310 07/28/23 0715 07/29/23 0322 07/30/23 0251  AST 126* 116* 55* 44*  ALT 164* 146* 87* 72*  ALKPHOS 62 78 61 65  BILITOT 1.0 1.1 1.2 0.7  PROT 5.7* 8.0 6.1* 6.0*  ALBUMIN 3.3* 4.5 3.4* 3.4*   Recent Labs  Lab 07/28/23 0715  LIPASE 33   No results for input(s): "AMMONIA" in the last 168 hours.  ABG    Component Value Date/Time   PHART 7.4 01/18/2023 0950   PCO2ART 38  01/18/2023 0950   PO2ART 96 01/18/2023 0950   HCO3 23.5 01/18/2023 0950   ACIDBASEDEF 1.1 01/18/2023 0950   O2SAT 99.5 01/18/2023 0950     Coagulation Profile: No results for input(s): "INR", "PROTIME" in the last 168 hours.  Cardiac Enzymes: Recent Labs  Lab 07/27/23 0310  CKTOTAL 530*    HbA1C: Hgb A1c MFr Bld  Date/Time Value Ref Range Status  01/16/2023 02:57 AM 5.4 4.8 - 5.6 % Final    Comment:    (NOTE) Pre diabetes:          5.7%-6.4%  Diabetes:              >6.4%  Glycemic control for   <7.0% adults with diabetes   03/20/2019 03:46 AM 5.4 4.8 - 5.6 % Final    Comment:    (NOTE) Pre diabetes:          5.7%-6.4% Diabetes:              >6.4% Glycemic control for   <7.0% adults with diabetes     CBG: No results for input(s): "GLUCAP" in the last 168 hours.  Review of Systems:   As above  Past Medical History:  He,  has a past medical history of Alcohol abuse, Anxiety, Class 1 obesity (09/14/2021), and Depression.   Surgical History:   Past Surgical History:  Procedure Laterality Date   HERNIA REPAIR       Social History:   reports that he has never smoked. He has never been exposed to tobacco smoke. He has never used smokeless tobacco. He reports current alcohol use of about 20.0 standard drinks of alcohol per week. He reports that he does not currently use drugs.   Family History:  His family history is not on file. He was adopted.   Allergies Allergies  Allergen Reactions   Ketamine     Hx of causing fever in 2024    Codeine Itching     Home Medications  Prior to Admission medications   Medication Sig Start Date End Date Taking? Authorizing Provider  folic acid (FOLVITE) 1 MG tablet Take 1 tablet (1 mg total) by mouth daily. 01/28/23  Yes Danford, Earl Lites, MD  hydrOXYzine (ATARAX) 25 MG tablet Take 50 mg by mouth every 8 (eight) hours as needed for anxiety. 06/10/23  Yes [provider]  mirtazapine (REMERON) 30 MG tablet  Take 30 mg by mouth at bedtime.   Yes [provider]  thiamine (VITAMIN B-1) 100 MG tablet Take 1 tablet (100 mg total) by mouth daily. 05/20/22  Yes [name], MD     Critical care time: 10   Lenard Galloway [facility] 07/31/23 7:13 AM  Please see [email or web address] for pager details.  From 7A-7P if no response, please call [phone] After hours, please call ELink [phone]

## 2023-07-31 NOTE — Progress Notes (Signed)
 PROGRESS NOTE  Jermaine [name]  ZOX:[id number] DOB: 1965-09-27 DOA: 07/28/2023 PCP: Patient, No Pcp Per   Brief Narrative: Patient is a 58 year old male with history of chronic alcohol abuse, delirium tremens, recurrent hospitalization for the same with history of leaving AMA presented with symptoms of alcohol withdrawal, vomiting.  He was last hospitalized on 1/28- 2/14 when he left AMA and again admitted on 2/25 -3/1 and left AMA.  On his last hospitalization, he was started on pressor breaks and required phenobarbital taper, physical restraints.  Admitted this time again with severe alcohol withdrawal symptoms, hallucinations, delirium treatments.  PCCM consulted.  Started on Precedex drip.  Currently being weaned off and on phenobarbital taper.  Psychiatry also consulted and recommending inpatient psychiatry admission after medical clearance.  Assessment & Plan:  Principal Problem:   Alcohol withdrawal (HCC) Active Problems:   Alcohol abuse with intoxication (HCC)   Chronic alcohol abuse/severe alcohol withdrawal/hallucinations/delirium tremens: Started on Precedex drip initially.  PCCM was following.  Currently on phenobarbital.  On thiamine and folic acid. But he was agitated last night and required Ativan dosing. Currently somnolent, hemodynamically stable Psychiatry was consulted on 3/4.  Recommended inpatient psychiatry admission after he is cleared medically for discharge.  If he tries to leave, IVC recommended.  Depression: Psychiatry recommended Remeron 30 mg daily.  Hypokalemia: Currently being monitored and supplemented  Transaminitis: Likely from alcohol hepatitis, resolved  Chest pain: Resolved.  No further workup recommended.  Cardiology was consulted.  Troponins were normal.  Echocardiogram was normal about 6 months ago.        DVT prophylaxis:enoxaparin (LOVENOX) injection 40 mg Start: 07/28/23 2200     Code Status: Full Code  Family Communication: None at  bedside  Patient status:Inpatient  Patient is from :home  Anticipated discharge to: Inpatient psychiatry  Estimated DC date: 2-3 days   Consultants: PCCM,psychiatry  Procedures:None  Antimicrobials:  Anti-infectives (From admission, onward)    None       Subjective: Patient seen and examined at bedside today.  He was given Ativan last night.  Somnolent during my evaluation.  Appeared comfortable.  Hemodynamically stable.  Objective: Vitals:   07/31/23 0538 07/31/23 0600 07/31/23 0800 07/31/23 0842  BP:  100/73 112/67   Pulse: 77 66 83 67  Resp:  19 18 18   Temp:      TempSrc:      SpO2: (!) 83% 98% 90% 94%  Weight:      Height:        Intake/Output Summary (Last 24 hours) at 07/31/2023 1007 Last data filed at 07/30/2023 2300 Gross per 24 hour  Intake 315 ml  Output --  Net 315 ml   Filed Weights   07/28/23 0658  Weight: 90.7 kg    Examination:  General exam: Sleeping ,not in distress Respiratory system:  no wheezes or crackles  Cardiovascular system: S1 & S2 heard, RRR.  Gastrointestinal system: Abdomen is nondistended, soft and nontender. Central nervous system: Sleeping Extremities: No edema, no clubbing ,no cyanosis Skin: No rashes, no ulcers,no icterus     Data Reviewed: I have personally reviewed following labs and imaging studies  CBC: Recent Labs  Lab 07/26/23 0311 07/27/23 0310 07/28/23 0715 07/29/23 0322  WBC 5.5 6.6 14.6* 10.8*  NEUTROABS  --   --  11.9*  --   HGB 15.8 15.0 17.4* 13.7  HCT 48.3 45.6 51.4 42.6  MCV 97.6 98.1 94.1 100.0  PLT 112* 116* 151 106*   Basic Metabolic Panel: Recent Labs  Lab 07/27/23 0310 07/28/23 0715 07/29/23 0322 07/30/23 0251 07/31/23 0828  NA 137 141 138 137 139  K 3.5 3.8 3.5 3.2* 3.6  CL 101 96* 101 99 103  CO2 25 25 28 27 27   GLUCOSE 91 105* 101* 130* 90  BUN 15 13 20 17 10   CREATININE 0.53* 1.00 0.83 0.74 0.79  CALCIUM 8.5* 9.6 8.8* 8.3* 8.7*  MG 1.9 1.8  --   --   --   PHOS 3.4  --    --   --   --      Recent Results (from the past 240 hours)  MRSA Next Gen by PCR, Nasal     Status: None   Collection Time: 07/23/23  6:51 PM   Specimen: Nasal Mucosa; Nasal Swab  Result Value Ref Range Status   MRSA by PCR Next Gen NOT DETECTED NOT DETECTED Final    Comment: (NOTE) The GeneXpert MRSA Assay (FDA approved for NASAL specimens only), is one component of a comprehensive MRSA colonization surveillance program. It is not intended to diagnose MRSA infection nor to guide or monitor treatment for MRSA infections. Test performance is not FDA approved in patients less than 6 years old. Performed at [facility], [address], [place], [place] [address]      Radiology Studies: ECHOCARDIOGRAM COMPLETE Result Date: 07/29/2023    ECHOCARDIOGRAM REPORT   Patient Name:   Jermaine [name] Date of Exam: 07/29/2023 Medical Rec #:  [id number]     Height:       70.0 in Accession #:    [id number]    Weight:       200.0 lb Date of Birth:  05/30/1965     BSA:          2.087 m Patient Age:    57 years      BP:           134/84 mmHg Patient Gender: M             HR:           97 bpm. Exam Location:  Inpatient Procedure: 2D Echo, Cardiac Doppler, Color Doppler and Intracardiac            Opacification Agent (Both Spectral and Color Flow Doppler were            utilized during procedure). Indications:    Chest Pain R07.9  History:        Patient has prior history of Echocardiogram examinations, most                 recent 01/18/2023.  Sonographer:    Darlys Gales Referring Phys: [id number] ZANE ADAMS IMPRESSIONS  1. Left ventricular ejection fraction, by estimation, is 55 to 60%. The left ventricle has normal function. The left ventricle has no regional wall motion abnormalities. There is mild concentric left ventricular hypertrophy. Left ventricular diastolic parameters are consistent with Grade I diastolic dysfunction (impaired relaxation).  2. Right ventricular systolic function is normal.  The right ventricular size is normal. Tricuspid regurgitation signal is inadequate for assessing PA pressure.  3. The mitral valve is normal in structure. No evidence of mitral valve regurgitation.  4. The aortic valve was not well visualized. Aortic valve regurgitation is not visualized. No aortic stenosis is present.  5. IVC not visualized.  6. Technically difficult study with poor acoustic windows. FINDINGS  Left Ventricle: Left ventricular ejection fraction, by estimation, is 55 to 60%. The left ventricle has  normal function. The left ventricle has no regional wall motion abnormalities. Definity contrast agent was given IV to delineate the left ventricular  endocardial borders. The left ventricular internal cavity size was normal in size. There is mild concentric left ventricular hypertrophy. Left ventricular diastolic parameters are consistent with Grade I diastolic dysfunction (impaired relaxation). Right Ventricle: The right ventricular size is normal. No increase in right ventricular wall thickness. Right ventricular systolic function is normal. Tricuspid regurgitation signal is inadequate for assessing PA pressure. Left Atrium: Left atrial size was normal in size. Right Atrium: Right atrial size was normal in size. Pericardium: There is no evidence of pericardial effusion. Mitral Valve: The mitral valve is normal in structure. No evidence of mitral valve regurgitation. Tricuspid Valve: The tricuspid valve is not well visualized. Tricuspid valve regurgitation is not demonstrated. Aortic Valve: The aortic valve was not well visualized. Aortic valve regurgitation is not visualized. No aortic stenosis is present. Aortic valve mean gradient measures 7.0 mmHg. Aortic valve peak gradient measures 10.5 mmHg. Aortic valve area, by VTI measures 2.58 cm. Pulmonic Valve: The pulmonic valve was not well visualized. Pulmonic valve regurgitation is not visualized. Aorta: The aortic root is normal in size and structure.  Venous: IVC not visualized. IAS/Shunts: No atrial level shunt detected by color flow Doppler.  LEFT VENTRICLE PLAX 2D LVIDd:         5.40 cm   Diastology LVIDs:         3.50 cm   LV e' medial:    8.70 cm/s LV PW:         1.10 cm   LV E/e' medial:  8.1 LV IVS:        1.20 cm   LV e' lateral:   10.90 cm/s LVOT diam:     1.90 cm   LV E/e' lateral: 6.4 LV SV:         56 LV SV Index:   27 LVOT Area:     2.84 cm  RIGHT VENTRICLE RV S prime:     20.90 cm/s TAPSE (M-mode): 3.2 cm LEFT ATRIUM             Index        RIGHT ATRIUM           Index LA Vol (A2C):   31.4 ml 15.04 ml/m  RA Area:     16.50 cm LA Vol (A4C):   52.3 ml 25.06 ml/m  RA Volume:   44.20 ml  21.18 ml/m LA Biplane Vol: 43.6 ml 20.89 ml/m  AORTIC VALVE AV Area (Vmax):    2.63 cm AV Area (Vmean):   2.25 cm AV Area (VTI):     2.58 cm AV Vmax:           162.00 cm/s AV Vmean:          122.000 cm/s AV VTI:            0.219 m AV Peak Grad:      10.5 mmHg AV Mean Grad:      7.0 mmHg LVOT Vmax:         150.00 cm/s LVOT Vmean:        96.700 cm/s LVOT VTI:          0.199 m LVOT/AV VTI ratio: 0.91 MITRAL VALVE MV Area (PHT): 3.83 cm    SHUNTS MV Decel Time: 198 msec    Systemic VTI:  0.20 m MV E velocity: 70.30 cm/s  Systemic Diam: 1.90 cm MV  A velocity: 80.10 cm/s MV E/A ratio:  0.88 Dalton McleanMD Electronically signed by Wilfred Lacy Signature Date/Time: 07/29/2023/3:07:42 PM    Final     Scheduled Meds:  Chlorhexidine Gluconate Cloth  6 each Topical Daily   enoxaparin (LOVENOX) injection  40 mg Subcutaneous QHS   folic acid  1 mg Oral Daily   mirtazapine  30 mg Oral QHS   multivitamin with minerals  1 tablet Oral Daily   pantoprazole  40 mg Oral Daily   phenobarbital  97.2 mg Oral Q8H   Followed by   phenobarbital  64.8 mg Oral Q8H   Followed by   Melene Muller ON 08/02/2023] phenobarbital  32.4 mg Oral Q8H   thiamine (VITAMIN B1) injection  100 mg Intravenous Q24H   Or   thiamine  100 mg Oral Q24H   Continuous Infusions:   LOS: 3 days    Burnadette Pop, MD [facility] P3/09/2023, 10:07 AM

## 2023-07-31 NOTE — Consult Note (Signed)
 [name] Psychiatric Consult Initial  Patient Name: .[name]  MRN: [id number]  DOB: 1966-05-28  Consult Order details:  Orders (From admission, onward)     Start     Ordered   07/30/23 0740  IP CONSULT TO PSYCHIATRY       Ordering Provider: Meredeth Ide, MD  Provider:  (Not yet assigned)  Question Answer Comment  Location [name]   Reason for Consult? Depression, alcohol use disorder      07/30/23 0739             Mode of Visit: In person    Psychiatry Consult Evaluation  Service Date: July 31, 2023 LOS:  LOS: 3 days  Chief Complaint Alcohol intoxication with Alcohol withdrawal  Primary Psychiatric Diagnoses  Alcohol withdrawal 2.  Recurrent Major Depressive disorder 3.  Alcohol use Disorder  Assessment  [name] is a 58 y.o. male admitted: Presented to the EDfor 07/28/2023  6:53 AM for Alcohol withdrawal. He carries the psychiatric diagnoses of Alcohol use disorder,DT, Alcohol withdrawal Seizures, Depression, anxiety, and has a past medical history of  Hepatic Steatosis, Sinus Tachycardia.   His initial presentation of tremor, agitation is most consistent with alcohol withdrawal symptoms and intoxication. He meets criteria for inpatient alcohol detox treatment after stabilization in ICU based on presentation.  Current outpatient psychotropic medications include Remeron and historically he has had a unknown response to these medications. He was not compliant with medications prior to admission as evidenced by his report. On initial examination, patient was still shaky, unsteady on his feet . Please see plan below for detailed recommendations.   07/31/2023: Patient seen today.  He had to be awakened.  He continues on Librium taper with increased scoring on CIWA.  Per nursing/MAR that patient required 10 mg of Ativan overnight in addition to Librium.  Patient continues to endorse withdrawal symptoms and severe alcohol cravings.  He does  acknowledge hallucinations with withdrawal, but denies any overnight.  Nursing reports that patient does at times appear to respond to internal stimuli.  Patient is agreeable to a trial of Haldol as needed for psychosis, or increasing agitation.  At this time, patient continues in precontemplative mode regarding discharge planning and substance use rehabilitation.  He essentially declines residential rehabilitation or CDIOP.  He states that he intends to go to AA groups, get a workbook, and work with a sponsor.  He specifically denies any SI, HI today.  Diagnoses:  Active [name] problems: Principal Problem:   Alcohol withdrawal (HCC) Active Problems:   Alcohol abuse with intoxication (HCC)    Plan   ## Psychiatric Medication Recommendations:  --Continue Remeron 30 mg po daily for depression -- Start Haldol 2 mg every 8 hours as needed for agitation or psychosis. If patient is unable to take p.o. for agitation is such that patient is a danger to himself or others, Haldol 5 mg IM can be administered.  ## Medical Decision Making Capacity: Not specifically addressed in this encounter  ## Further Work-up:  - most recent EKG on 07/28/2023 had QtC of 483 --EKG from 07/29/2023 is not available for review at this time -- Pertinent labwork reviewed earlier this admission includes: TSH CMP   ## Disposition:-- We recommend inpatient psychiatric hospitalization when medically cleared. Patient is under voluntary admission status at this time; please IVC if attempts to leave [name].  ## Behavioral / Environmental: - No specific recommendations at this time.     ## Safety and Observation Level:  -  Based on my clinical evaluation, I estimate the patient to be at Low risk of self harm in the current setting. - At this time, we recommend  routine. This decision is based on my review of the chart including patient's history and current presentation, interview of the patient, mental status examination,  and consideration of suicide risk including evaluating suicidal ideation, plan, intent, suicidal or self-harm behaviors, risk factors, and protective factors. This judgment is based on our ability to directly address suicide risk, implement suicide prevention strategies, and develop a safety plan while the patient is in the clinical setting. Please contact our team if there is a concern that risk level has changed.  CSSR Risk Category:C-SSRS RISK CATEGORY: No Risk  Suicide Risk Assessment: Patient has following modifiable risk factors for suicide: untreated depression, under treated depression , recklessness, medication noncompliance, and lack of access to outpatient mental health resources, which we are addressing by recommending inpatient Psychiatry hospitalization. Patient has following non-modifiable or demographic risk factors for suicide: male gender and psychiatric hospitalization Patient has the following protective factors against suicide: Access to outpatient mental health care, Supportive family, no history of suicide attempts, and no history of NSSIB  Thank you for this consult request. Recommendations have been communicated to the primary team.  We will continue to round on patient daily at this time.   Jermaine Craft, MD       History of Present Illness  Relevant Aspects of [name] ED Course:  Admitted on 07/28/2023 for Alcohol abuse, Delirium tremens   07/30/2023 patient is a 58 years old male seen today in ICU receiving treatment for Alcohol withdrawal symptoms -DT, Delirium.  Chart is reviewed and patient discussed with DR Viviano Simas, Psychiatrist.  Patient is well known to [name].  Patient  has previous hx of Depression, anxiety and Alcohol use disorder.  Patient was admitted to ICU on the second of March for Medical detoxification.  Patient signed himself out of the [name] two days prior to this visit.  He came back again intoxicated and complained of  withdrawal symptoms.   Psychiatry was consulted to see patient although he is still experiencing withdrawal symptoms and remains on Phenobarb taper.  He was seen in his room as he walked in from walking with PT staff.  His gait is unsteady and he had mild tremors of his fingers.  He engaged in meaningful conversation.  Patient admitted having issues with Alcoholism stating he started drinking alcohol at age 52.  He also admitted been to rehab three times and really did well in the program but relapses after a month or less of leaving the facility.  He reports using Alcohol to treat depression and loneliness.  He reports using alcohol to cure loneliness and when he bored.  He reports that he is now ready to stop drinking alcohol.  His choice of Alcohol is Liquor  which he drinks half a pint a day.  Patient is not currently taking is Remeron for depression and states he uses Alcohol to treat himself.  He denies SI/HI/AVH.  Patient declined offer to be admitted to inpatient Psychiatry unit  for further detox treatment and treatment of depression.  Patient states he has done every treatment in the past but the problem is that he relapses and does not follow through.  He has no outpatient Psychiatrist but his PCP prescribed his Remeron for him. Patient is still on Phenobarb taper and receiving Ativan for withdrawal as well.  He is still in need for advanced treatment in ICU Psychiatry will continue to see patient until he stable to be discharged home or come to Psychiatry unit.  07/31/2023: Patient not medically stable.  He continues to have withdrawal symptoms and cravings.  He has had some hallucinations and will be started on as needed Haldol.  He specifically denies any SI, HI.  He is uncertain of discharge plans, but thinks he would like to attend AA.  He is currently unemployed and feels he needs time during the day to seek employment.  He states that he does have a place to live after discharge.   Psych ROS:   Depression: yes Anxiety:  yes Mania (lifetime and current): na Psychosis: (lifetime and current): Yes, with withdrawal  Collateral information:  Patient does not want his mother or sister called for collateral.  Review of Systems  Constitutional: Negative.   HENT: Negative.    Eyes: Negative.   Respiratory: Negative.    Cardiovascular: Negative.   Gastrointestinal:  Positive for nausea.  Genitourinary: Negative.   Musculoskeletal: Negative.   Skin: Negative.   Neurological: Negative.   Endo/Heme/Allergies: Negative.   Psychiatric/Behavioral:  Positive for depression. The patient is nervous/anxious.      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  [facility] hall x 3 times;   07/31/2023: He tells me last residential rehabilitation program was 3 years ago.  He states that these have not been helpful.  Exam Findings  Physical Exam:  Vital Signs:  Temp:  [97.8 F (36.6 C)-99.6 F (37.6 C)] 97.8 F (36.6 C) (03/05 0330) Pulse Rate:   [66-95] 67 (03/05 0842) Resp:  [10-27] 18 (03/05 0842) BP: (91-190)/(58-112) 112/67 (03/05 0800) SpO2:  [83 %-100 %] 94 % (03/05 0842) Blood pressure 112/67, pulse 67, temperature 97.8 F (36.6 C), temperature source Oral, resp. rate 18, height 5\' 10"  (1.778 m), weight 90.7 kg, SpO2 94%. Body mass index is 28.7 kg/m.  Physical Exam Constitutional:      Appearance: Normal appearance.  HENT:     Nose: Nose normal.  Cardiovascular:     Rate and Rhythm: Normal rate and regular rhythm.  Pulmonary:     Effort: Pulmonary effort is normal.  Musculoskeletal:        General: Normal range of motion.     Comments: Unsteady gait, requires PT walking him and close monitoring.  Skin:    General: Skin is dry.  Neurological:     Mental Status: He is alert and oriented to person, place, and time.  Psychiatric:        Attention and Perception: He is inattentive.        Mood and Affect: Mood is  anxious and depressed.        Speech: Speech normal.        Behavior: Behavior normal. Behavior is cooperative.        Thought Content: Thought content normal.        Cognition and Memory: Cognition is impaired.        Judgment: Judgment is impulsive.   Patient has a Recruitment consultant due to unsteady gait  Mental Status Exam: General Appearance: Disheveled and Unkempt  Orientation:  Full (Time, Place, and Person)  Memory:  Immediate;   Fair Recent;   Fair Remote;   Fair  Concentration:  Concentration: Poor and Attention Span: Poor  Recall:  Fair  Attention  Fair  Eye Contact:  Fair  Speech:  Normal Rate  Language:  Fair  Volume:  Normal  Mood: "Depressed, but better today"  Affect:  Congruent  Thought Process:  Coherent and Linear  Thought Content:  Logical  Suicidal Thoughts:  No  Homicidal Thoughts:  No  Judgement:  Impaired  Insight:  Shallow  Psychomotor Activity:  Tremor  Akathisia:  NA  Fund of Knowledge:  Fair      Assets:  Manufacturing systems engineer Desire for Improvement Housing   Cognition:  Impaired,  Moderate  ADL's:  Impaired  AIMS (if indicated):        Other History   These have been pulled in through the EMR, reviewed, and updated if appropriate.  Family History:  The patient's family history is not on file. He was adopted.  Medical History: Past Medical History:  Diagnosis Date   Alcohol abuse    Anxiety    Class 1 obesity 09/14/2021   Depression     Surgical History: Past Surgical History:  Procedure Laterality Date   HERNIA REPAIR       Medications:   Current Facility-Administered Medications:    albuterol (PROVENTIL) (2.5 MG/3ML) 0.083% nebulizer solution 2.5 mg, 2.5 mg, Nebulization, Q2H PRN, Kirby Crigler, Mir M, MD   Chlorhexidine Gluconate Cloth 2 % PADS 6 each, 6 each, Topical, Daily, Kirby Crigler, Mir M, MD, 6 each at 07/30/23 1009   enoxaparin (LOVENOX) injection 40 mg, 40 mg, Subcutaneous, QHS, Kirby Crigler, Mir M, MD, 40 mg at 07/30/23 2147   folic acid (FOLVITE) tablet 1 mg, 1 mg, Oral, Daily, Kirby Crigler, Mir M, MD, 1 mg at 07/30/23 1009   hydrOXYzine (ATARAX) tablet 25 mg, 25 mg, Oral, Q6H PRN, Kirby Crigler, Mir M, MD, 25 mg at 07/30/23 2006   ibuprofen (ADVIL) tablet 400 mg, 400 mg, Oral, Q6H PRN, Kirby Crigler, Mir M, MD, 400 mg at 07/30/23 2006   loperamide (IMODIUM) capsule 2-4 mg, 2-4 mg, Oral, PRN, Kirby Crigler, Mir M, MD   LORazepam (ATIVAN) injection 1-4 mg, 1-4 mg, Intravenous, Q1H PRN, Kirby Crigler, Mir M, MD, 4 mg at 07/31/23 0321   mirtazapine (REMERON) tablet 30 mg, 30 mg, Oral, QHS, Ikramullah, Mir M, MD, 30 mg at 07/30/23 2135   multivitamin with minerals tablet 1 tablet, 1 tablet, Oral, Daily, Kirby Crigler, Mir M, MD, 1 tablet at 07/30/23 1009   ondansetron (ZOFRAN) tablet 4 mg, 4 mg, Oral, Q6H PRN **OR** ondansetron (ZOFRAN) injection 4 mg, 4 mg, Intravenous, Q6H PRN, Kirby Crigler, Mir M, MD, 4 mg at 07/30/23 2007   Oral care mouth rinse, 15 mL, Mouth Rinse, PRN, Kirby Crigler, Mir M, MD   pantoprazole (PROTONIX) EC tablet 40 mg, 40  mg, Oral, Daily, Shade, Christine E, RPH, 40 mg at 07/30/23 1009   PHENobarbital (LUMINAL) tablet 97.2 mg, 97.2 mg,  Oral, Q8H, 97.2 mg at 07/30/23 2134 **FOLLOWED BY** PHENobarbital (LUMINAL) tablet 64.8 mg, 64.8 mg, Oral, Q8H **FOLLOWED BY** [START ON 08/02/2023] PHENobarbital (LUMINAL) tablet 32.4 mg, 32.4 mg, Oral, Q8H, Shade, Christine E, RPH   sodium chloride flush (NS) 0.9 % injection 10 mL, 10 mL, Intravenous, PRN, Sharl Ma, Sarina Ill, MD   thiamine (VITAMIN B1) injection 100 mg, 100 mg, Intravenous, Q24H, 100 mg at 07/28/23 1506 **OR** thiamine (VITAMIN B1) tablet 100 mg, 100 mg, Oral, Q24H, Kirby Crigler, Mir M, MD, 100 mg at 07/30/23 1707  Allergies: Allergies  Allergen Reactions   Ketamine     Hx of causing fever in 2024    Codeine Itching    Samnang Shugars Sherilyn Banker, MD  Total Time Spent in Direct Patient Care:  I personally spent 35 minutes on the unit in direct patient care. The direct patient care time included face-to-face time with the patient, reviewing the patient's chart, communicating with other professionals, and coordinating care. Greater than 50% of this time was spent in counseling or coordinating care with the patient regarding goals of hospitalization, psycho-education, and discharge planning needs.

## 2023-07-31 NOTE — Plan of Care (Signed)
  Problem: Education: Goal: Knowledge of General Education information will improve Description Including pain rating scale, medication(s)/side effects and non-pharmacologic comfort measures Outcome: Progressing   Problem: Clinical Measurements: Goal: Ability to maintain clinical measurements within normal limits will improve Outcome: Progressing Goal: Will remain free from infection Outcome: Progressing   Problem: Coping: Goal: Level of anxiety will decrease Outcome: Progressing   Problem: Elimination: Goal: Will not experience complications related to bowel motility Outcome: Progressing   Problem: Safety: Goal: Ability to remain free from injury will improve Outcome: Progressing   Problem: Skin Integrity: Goal: Risk for impaired skin integrity will decrease Outcome: Progressing

## 2023-07-31 NOTE — Plan of Care (Signed)
  Problem: Education: Goal: Knowledge of General Education information will improve Description: Including pain rating scale, medication(s)/side effects and non-pharmacologic comfort measures Outcome: Progressing   Problem: Health Behavior/Discharge Planning: Goal: Ability to manage health-related needs will improve Outcome: Progressing   Problem: Clinical Measurements: Goal: Ability to maintain clinical measurements within normal limits will improve Outcome: Progressing Goal: Will remain free from infection Outcome: Progressing Goal: Diagnostic test results will improve Outcome: Progressing Goal: Respiratory complications will improve Outcome: Progressing Goal: Cardiovascular complication will be avoided Outcome: Progressing   Problem: Activity: Goal: Risk for activity intolerance will decrease Outcome: Progressing   Problem: Nutrition: Goal: Adequate nutrition will be maintained Outcome: Progressing   Problem: Coping: Goal: Level of anxiety will decrease Outcome: Progressing   Problem: Elimination: Goal: Will not experience complications related to bowel motility Outcome: Progressing Goal: Will not experience complications related to urinary retention Outcome: Progressing   Problem: Pain Managment: Goal: General experience of comfort will improve and/or be controlled Outcome: Progressing   Problem: Safety: Goal: Ability to remain free from injury will improve Outcome: Progressing   Problem: Skin Integrity: Goal: Risk for impaired skin integrity will decrease Outcome: Progressing   Problem: Safety: Goal: Non-violent Restraint(s) Outcome: Not Applicable   Cindy S. Clelia Croft BSN, RN, [employer], CCRN 07/31/2023 6:32 AM

## 2023-08-01 DIAGNOSIS — F10931 Alcohol use, unspecified with withdrawal delirium: Secondary | ICD-10-CM | POA: Diagnosis not present

## 2023-08-01 DIAGNOSIS — F1010 Alcohol abuse, uncomplicated: Secondary | ICD-10-CM | POA: Diagnosis not present

## 2023-08-01 LAB — BASIC METABOLIC PANEL
Anion gap: 10 (ref 5–15)
BUN: 11 mg/dL (ref 6–20)
CO2: 27 mmol/L (ref 22–32)
Calcium: 8.8 mg/dL — ABNORMAL LOW (ref 8.9–10.3)
Chloride: 100 mmol/L (ref 98–111)
Creatinine, Ser: 0.72 mg/dL (ref 0.61–1.24)
GFR, Estimated: >60 mL/min (ref >60)
Glucose, Bld: 91 mg/dL (ref 70–99)
Potassium: 3.8 mmol/L (ref 3.5–5.1)
Sodium: 137 mmol/L (ref 135–145)

## 2023-08-01 MED ORDER — ALUM & MAG HYDROXIDE-SIMETH 200-200-20 MG/5ML PO SUSP
30.0000 mL | ORAL | Status: DC | PRN
Start: 2023-08-01 — End: 2023-08-02

## 2023-08-01 MED ORDER — MELATONIN 5 MG PO TABS
10.0000 mg | ORAL_TABLET | Freq: Every evening | ORAL | Status: DC | PRN
Start: 2023-08-01 — End: 2023-08-02
  Administered 2023-08-01: 10 mg via ORAL
  Filled 2023-08-01: qty 2

## 2023-08-01 NOTE — Consult Note (Addendum)
 [facility]  Patient Name: .Jermaine [name]  MRN: [id number]  DOB: [birth year]  Consult Order details:  Orders (From admission, onward)     Start     Ordered   07/30/23 0740  IP CONSULT TO PSYCHIATRY       Ordering Provider: Meredeth Ide, Jermaine [name]  Provider:  (Not yet assigned)  Question Answer Comment  Location [facility]   Reason for Consult? Depression, alcohol use disorder      07/30/23 0739             Mode of Visit: In person    Psychiatry Consult Evaluation  Service Date: August 01, 2023 LOS:  LOS: 4 days  Chief Complaint Alcohol intoxication with Alcohol withdrawal  Primary Psychiatric Diagnoses  Alcohol withdrawal 2.  Recurrent Major Depressive disorder 3.  Alcohol use Disorder  Assessment  Jermaine [name] is a 58 y.o. male admitted: Presented to the EDfor 07/28/2023  6:53 AM for Alcohol withdrawal. He carries the psychiatric diagnoses of Alcohol use disorder,DT, Alcohol withdrawal Seizures, Depression, anxiety, and has a past medical history of  Hepatic Steatosis, Sinus Tachycardia.   His initial presentation of tremor, agitation is most consistent with alcohol withdrawal symptoms and intoxication. He meets criteria for inpatient alcohol detox treatment after stabilization in ICU based on presentation.  Current outpatient psychotropic medications include Remeron and historically he has had a unknown response to these medications. He was not compliant with medications prior to admission as evidenced by his report. On initial examination, patient was still shaky, unsteady on his feet . Please see plan below for detailed recommendations.   07/31/2023: Patient seen today.  He had to be awakened.  He continues on Librium taper with increased scoring on CIWA.  Per nursing/MAR that patient required 10 mg of Ativan overnight in addition to Librium.  Patient continues to endorse withdrawal symptoms and severe alcohol cravings.  He does acknowledge  hallucinations with withdrawal, but denies any overnight.  Nursing reports that patient does at times appear to respond to internal stimuli.  Patient is agreeable to a trial of Haldol as needed for psychosis, or increasing agitation.  At this time, patient continues in precontemplative mode regarding discharge planning and substance use rehabilitation.  He essentially declines residential rehabilitation or CDIOP.  He states that he intends to go to AA groups, get a workbook, and work with a sponsor.  He specifically denies any SI, HI today.   08/01/23: patient continues to deny SI, HI< AH or VH. He required 2 doses of ativan this AM for elevated CIWA. He continues to decline voluntary admission to rehab and he does not meet [place] Criteria for IVC for at this time.   Diagnoses:  Active Hospital problems: Principal Problem:   Alcohol withdrawal (HCC) Active Problems:   Alcohol abuse with intoxication (HCC)    Plan   ## Psychiatric Medication Recommendations:  --Continue Remeron 30 mg po daily for depression -- Use Haldol 2 mg TID PRN for agitation.  If patient is unable to take p.o. for agitation is such that patient is a danger to himself or others, Haldol 5 mg IM can be administered.  ## Medical Decision Making Capacity: Not specifically addressed in this encounter  ## Further Work-up:  - most recent EKG on 07/29/23 had QtC of 463 -- Pertinent labwork reviewed earlier this admission includes: TSH CMP   ## Disposition:-- There are no psychiatric contraindications to discharge at this time - recommend referral to psychiatry  upon discharge.   ## Behavioral / Environmental: - No specific recommendations at this time.     ## Safety and Observation Level:  - Based on my clinical evaluation, I estimate the patient to be at Low risk of self harm in the current setting. - At this time, we recommend  routine. This decision is based on my review of the chart including patient's history and  current presentation, interview of the patient, mental status examination, and consideration of suicide risk including evaluating suicidal ideation, plan, intent, suicidal or self-harm behaviors, risk factors, and protective factors. This judgment is based on our ability to directly address suicide risk, implement suicide prevention strategies, and develop a safety plan while the patient is in the clinical setting. Please contact our team if there is a concern that risk level has changed.  CSSR Risk Category:C-SSRS RISK CATEGORY: No Risk  Suicide Risk Assessment: Patient has following modifiable risk factors for suicide: under treated depression , recklessness, medication noncompliance, and lack of access to outpatient mental health resources  Patient has following non-modifiable or demographic risk factors for suicide: male gender and psychiatric hospitalization Patient has the following protective factors against suicide: Access to outpatient mental health care, Supportive family, no history of suicide attempts, and no history of NSSIB  Thank you for this consult request. Recommendations have been communicated to the primary team.  We will sign off at this time.   Jermaine Derby, Jermaine [name]       History of Present Illness  Relevant Aspects of Hospital ED Course:  Admitted on 07/28/2023 for Alcohol abuse, Delirium tremens   07/30/2023 patient is a 58 years old male seen today in ICU receiving treatment for Alcohol withdrawal symptoms -DT, Delirium.  Chart is reviewed and patient discussed with DR Viviano Simas, Psychiatrist.  Patient is well known to [facility] long ER and Medical units.  Patient  has previous hx of Depression, anxiety and Alcohol use disorder.  Patient was admitted to ICU on the second of March for Medical detoxification.  Patient signed himself out of the hospital two days prior to this visit.  He came back again intoxicated and complained of withdrawal symptoms.   Psychiatry was consulted to see  patient although he is still experiencing withdrawal symptoms and remains on Phenobarb taper.  He was seen in his room as he walked in from walking with PT staff.  His gait is unsteady and he had mild tremors of his fingers.  He engaged in meaningful conversation.  Patient admitted having issues with Alcoholism stating he started drinking alcohol at age 39.  He also admitted been to rehab three times and really did well in the program but relapses after a month or less of leaving the facility.  He reports using Alcohol to treat depression and loneliness.  He reports using alcohol to cure loneliness and when he bored.  He reports that he is now ready to stop drinking alcohol.  His choice of Alcohol is Liquor  which he drinks half a pint a day.  Patient is not currently taking is Remeron for depression and states he uses Alcohol to treat himself.  He denies SI/HI/AVH.  Patient declined offer to be admitted to inpatient Psychiatry unit  for further detox treatment and treatment of depression.  Patient states he has done every treatment in the past but the problem is that he relapses and does not follow through.  He has no outpatient Psychiatrist but his PCP prescribed his Remeron for him. Patient  is still on Phenobarb taper and receiving Ativan for withdrawal as well.  He is still in need for advanced treatment in ICU Psychiatry will continue to see patient until he stable to be discharged home or come to Psychiatry unit.  07/31/2023: Patient not medically stable.  He continues to have withdrawal symptoms and cravings.  He has had some hallucinations and will be started on as needed Haldol.  He specifically denies any SI, HI.  He is uncertain of discharge plans, but thinks he would like to attend AA.  He is currently unemployed and feels he needs time during the day to seek employment.  He states that he does have a place to live after discharge.  08/01/23: patient seen at bedside today. He wsa calm and pleasant but  had some mild noticeable hand tremors. He states that he is ready to go for a walk around the unit with his team to feel better. He states that he realizes he has to quit alcohol for good so he can get his life back together and find a job so he is not sitting around all day bored and drinking. He states that he plan to attend AA twice a day every day and that he is "embarrassed" that he will pick up a beginner chip at next AA meeting.  He realizes his alcohol use as a health issue for him however he states he "knows what he has to do" and does not need to go to rehab at this time.  He denied any suicidal or homicidal ideations and specifically states he "never thought about harming himself".  He denied any Depression, Anxiety, AH or VH.   Psych ROS:  Depression: No Anxiety:  No Mania (lifetime and current): na Psychosis: (lifetime and current): Yes, with withdrawal  Collateral information:  Patient does not want his mother or sister called for collateral.  Review of Systems  Constitutional: Negative.   HENT: Negative.    Eyes: Negative.   Respiratory: Negative.    Cardiovascular: Negative.   Gastrointestinal: Negative.   Genitourinary: Negative.   Musculoskeletal: Negative.   Skin: Negative.   Neurological: Negative.   Endo/Heme/Allergies: Negative.   Psychiatric/Behavioral:  Negative for depression, hallucinations and suicidal ideas. The patient is not nervous/anxious.      Psychiatric and Social History  Psychiatric History:  Information collected from Patient  Prev Dx/Sx: severe alcohol use disorder, seizures/DT previously, mood disorder, Depression, anxiety Current Psych Provider: none Home Meds (current): Remeron  Previous Med Trials: unknown Therapy: denies  Prior Psych Hospitalization: multiple for Alcohol intoxication  Prior Self Harm: denies Prior Violence: yes, when intoxicated  Family Psych History: Denies Family Hx suicide: denies  Social History:   Developmental Hx: wnl Educational Hx: unknown Occupational Hx: [employer] job, not currently working 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    Substance History Alcohol: yes  Type of alcohol Liquor Last Drink 07/23/2023 Number of drinks per day varies History of alcohol withdrawal seizures yes History of DT's yes Tobacco: Denies Illicit drugs: denies Prescription drug abuse: denies [facility]hab hx: [facility]; reports he did not find it helpful.   Exam Findings  Physical Exam:  Vital Signs:  Temp:  [97.6 F (36.4 C)-99.3 F (37.4 C)] 99.3 F (37.4 C) (03/06 1754) Pulse Rate:  [63-99] 78 (03/06 1754) Resp:  [6-25] 16 (03/06 1754) BP: (69-115)/(41-88) 94/72 (03/06 1754) SpO2:  [  90 %-100 %] 100 % (03/06 1754) Weight:  [87.9 kg] 87.9 kg (03/06 1741) Blood pressure 94/72, pulse 78, temperature 99.3 F (37.4 C), temperature source Oral, resp. rate 16, height 5\' 10"  (1.778 m), weight 87.9 kg, SpO2 100%. Body mass index is 27.81 kg/m.  Physical Exam Vitals and nursing note reviewed.  Constitutional:      Appearance: Normal appearance.  HENT:     Nose: Nose normal.  Cardiovascular:     Rate and Rhythm: Normal rate and regular rhythm.  Pulmonary:     Effort: Pulmonary effort is normal.  Musculoskeletal:        General: Normal range of motion.     Comments: Unsteady gait  Skin:    General: Skin is dry.  Neurological:     General: No focal deficit present.     Mental Status: He is alert and oriented to person, place, and time.  Psychiatric:        Attention and Perception: He is attentive. He does not perceive auditory or visual hallucinations.        Mood and Affect: Mood is not anxious or depressed. Affect is not labile.        Speech: Speech normal.        Behavior: Behavior normal. Behavior is cooperative.        Thought Content: Thought  content normal.        Cognition and Memory: Cognition is not impaired.   Patient has a Recruitment consultant due to unsteady gait  Mental Status Exam: General Appearance: obese white male dressed in hospital gown. Appears tired.   Orientation:  Full (Time, Place, and Person)  Memory:  Immediate;   Fair Recent;   Fair Remote;   Fair  Concentration:  Concentration: Fair and Attention Span: Good  Recall:  Fair  Attention  Fair  Eye Contact:  Fair  Speech:  Normal Rate  Language:  Fair  Volume:  Normal  Mood: "good< I feel ready to go"  Affect:  Congruent, Full.   Thought Process:  Coherent and Linear  Thought Content:  Logical  Suicidal Thoughts:  No  Homicidal Thoughts:  No  Judgement:  Fair  Insight:  Shallow  Psychomotor Activity:  Tremor  Akathisia:  NA  Fund of Knowledge:  Fair      Other History   These have been pulled in through the EMR, reviewed, and updated if appropriate.  Family History:  The patient's family history is not on file. He was adopted.  Medical History: Past Medical History:  Diagnosis Date   Alcohol abuse    Anxiety    Class 1 obesity 09/14/2021   Depression     Surgical History: Past Surgical History:  Procedure Laterality Date   HERNIA REPAIR       Medications:   Current Facility-Administered Medications:    albuterol (PROVENTIL) (2.5 MG/3ML) 0.083% nebulizer solution 2.5 mg, 2.5 mg, Nebulization, Q2H PRN, Kirby Crigler, Mir M, Jermaine [name]   alum & mag hydroxide-simeth (MAALOX/MYLANTA) 200-200-20 MG/5ML suspension 30 mL, 30 mL, Oral, Q4H PRN, Burnadette Pop, Jermaine [name]   Chlorhexidine Gluconate Cloth 2 % PADS 6 each, 6 each, Topical, Daily, Kirby Crigler, Mir M, Jermaine [name], 6 each at 08/01/23 0912   enoxaparin (LOVENOX) injection 40 mg, 40 mg, Subcutaneous, QHS, Kirby Crigler, Mir M, Jermaine [name], 40 mg at 08/01/23 2119   folic acid (FOLVITE) tablet 1 mg, 1 mg, Oral, Daily, Kirby Crigler, Mir M, Jermaine [name], 1 mg at 08/01/23 1008   haloperidol (HALDOL) tablet 2 mg, 2 mg,  Oral, Q8H PRN, 2 mg at  08/01/23 2119 **OR** haloperidol lactate (HALDOL) injection 5 mg, 5 mg, Intramuscular, Q8H PRN, Mariel Craft, Jermaine [name]   ibuprofen (ADVIL) tablet 400 mg, 400 mg, Oral, Q6H PRN, Kirby Crigler, Mir M, Jermaine [name], 400 mg at 08/01/23 2028   mirtazapine (REMERON) tablet 30 mg, 30 mg, Oral, QHS, Kirby Crigler, Mir M, Jermaine [name], 30 mg at 08/01/23 2119   multivitamin with minerals tablet 1 tablet, 1 tablet, Oral, Daily, Kirby Crigler, Mir M, Jermaine [name], 1 tablet at 08/01/23 1009   ondansetron (ZOFRAN) tablet 4 mg, 4 mg, Oral, Q6H PRN **OR** ondansetron (ZOFRAN) injection 4 mg, 4 mg, Intravenous, Q6H PRN, Kirby Crigler, Mir M, Jermaine [name], 4 mg at 07/30/23 2007   Oral care mouth rinse, 15 mL, Mouth Rinse, PRN, Kirby Crigler, Mir M, Jermaine [name]   pantoprazole (PROTONIX) EC tablet 40 mg, 40 mg, Oral, Daily, Shade, Christine E, RPH, 40 mg at 08/01/23 1008   [EXPIRED] PHENobarbital (LUMINAL) tablet 97.2 mg, 97.2 mg, Oral, Q8H, 97.2 mg at 07/30/23 2134 **FOLLOWED BY** PHENobarbital (LUMINAL) tablet 64.8 mg, 64.8 mg, Oral, Q8H, 64.8 mg at 08/01/23 1328 **FOLLOWED BY** [START ON 08/02/2023] PHENobarbital (LUMINAL) tablet 32.4 mg, 32.4 mg, Oral, Q8H, Shade, Christine E, RPH   sodium chloride flush (NS) 0.9 % injection 10 mL, 10 mL, Intravenous, PRN, Sharl Ma, Sarina Ill, Jermaine [name]   thiamine (VITAMIN B1) injection 100 mg, 100 mg, Intravenous, Q24H, 100 mg at 07/28/23 1506 **OR** thiamine (VITAMIN B1) tablet 100 mg, 100 mg, Oral, Q24H, Kirby Crigler, Mir M, Jermaine [name], 100 mg at 08/01/23 1605  Allergies: Allergies  Allergen Reactions   Ketamine     Hx of causing fever in 2024    Codeine Itching    Jermaine Derby, Jermaine [name]  Total Time Spent in Direct Patient Care:  I personally spent 56 minutes on the unit in direct patient care. The direct patient care time included face-to-face time with the patient, reviewing the patient's chart, communicating with other professionals, and coordinating care. Greater than 50% of this time was spent in counseling or coordinating care with the patient regarding goals  of hospitalization, psycho-education, and discharge planning needs.

## 2023-08-01 NOTE — Plan of Care (Signed)

## 2023-08-01 NOTE — Progress Notes (Signed)
 PROGRESS NOTE  Jermaine [name]  EAV:[id number] DOB: [birth year] DOA: 07/28/2023 PCP: Patient, No Pcp Per   Brief Narrative: Patient is a 58 year old male with history of chronic alcohol abuse, delirium tremens, recurrent hospitalization for the same with history of leaving AMA presented with symptoms of alcohol withdrawal, vomiting.  He was last hospitalized on 1/28- 2/14 when he left AMA and again admitted on 2/25 -3/1 and left AMA.  On his last hospitalization, he was started on pressor breaks and required phenobarbital taper, physical restraints.  Admitted this time again with severe alcohol withdrawal symptoms, hallucinations, delirium treatments.  PCCM consulted.  Started on Precedex drip.  Now on  phenobarbital taper.  Psychiatry also consulted and recommending inpatient psychiatry admission after medical clearance.  This morning he is alert and oriented, hemodynamically stable.  Medically stable for discharge to inpatient psychiatry whenever possible.  Assessment & Plan:  Principal Problem:   Alcohol withdrawal (HCC) Active Problems:   Alcohol abuse with intoxication (HCC)   Chronic alcohol abuse/severe alcohol withdrawal/hallucinations/delirium tremens: Started on Precedex drip initially.  PCCM was following.  Currently on phenobarbital.  On thiamine and folic acid. Psychiatry was consulted on 3/4.  Recommended inpatient psychiatry admission after he is cleared medically for discharge.  If he tries to leave, IVC recommended. This morning he is cooperative, calm.  Fully alert and oriented.  He is interested in alcohol rehabilitation.  TOC consulted.  Depression: Psychiatry recommended Remeron 30 mg daily.  Hypokalemia: Supplemented  and corrected  Transaminitis: Likely from alcohol hepatitis, resolved  Chest pain: Resolved.  No further workup recommended.  Cardiology was consulted.  Troponins were normal.  Echocardiogram was normal about 6 months ago.        DVT  prophylaxis:enoxaparin (LOVENOX) injection 40 mg Start: 07/28/23 2200     Code Status: Full Code  Family Communication: None at bedside  Patient status:Inpatient  Patient is from :home  Anticipated discharge to: Inpatient psychiatry  Estimated DC date: whenever possible   Consultants: PCCM,psychiatry  Procedures:None  Antimicrobials:  Anti-infectives (From admission, onward)    None       Subjective: Patient seen and examined at bedside today.  Hemodynamically stable.  He was inside the bathroom.  Ambulated this morning.  Comfortable.  Denies any new complaints.  Completely alert and oriented and cooperative.  He said that he is motivated to quit alcohol.  Objective: Vitals:   08/01/23 0900 08/01/23 1000 08/01/23 1005 08/01/23 1010  BP: (!) 90/57  103/78   Pulse: 87 99 85 89  Resp: (!) 21     Temp:      TempSrc:      SpO2: 98%     Weight:      Height:        Intake/Output Summary (Last 24 hours) at 08/01/2023 1020 Last data filed at 08/01/2023 1012 Gross per 24 hour  Intake 990 ml  Output 500 ml  Net 490 ml   Filed Weights   07/28/23 0658  Weight: 90.7 kg    Examination:  General exam: comfortable ,not in distress Respiratory system:  no wheezes or crackles  Cardiovascular system: S1 & S2 heard, RRR.  Gastrointestinal system: Abdomen is nondistended, soft and nontender. Central nervous system: Alert, awake and oriented Extremities: No edema, no clubbing ,no cyanosis Skin: No rashes, no ulcers,no icterus     Data Reviewed: I have personally reviewed following labs and imaging studies  CBC: Recent Labs  Lab 07/26/23 0311 07/27/23 0310 07/28/23 0715 07/29/23 0322  WBC  5.5 6.6 14.6* 10.8*  NEUTROABS  --   --  11.9*  --   HGB 15.8 15.0 17.4* 13.7  HCT 48.3 45.6 51.4 42.6  MCV 97.6 98.1 94.1 100.0  PLT 112* 116* 151 106*   Basic Metabolic Panel: Recent Labs  Lab 07/27/23 0310 07/28/23 0715 07/29/23 0322 07/30/23 0251 07/31/23 0828  08/01/23 0246  NA 137 141 138 137 139 137  K 3.5 3.8 3.5 3.2* 3.6 3.8  CL 101 96* 101 99 103 100  CO2 25 25 28 27 27 27   GLUCOSE 91 105* 101* 130* 90 91  BUN 15 13 20 17 10 11   CREATININE 0.53* 1.00 0.83 0.74 0.79 0.72  CALCIUM 8.5* 9.6 8.8* 8.3* 8.7* 8.8*  MG 1.9 1.8  --   --   --   --   PHOS 3.4  --   --   --   --   --      Recent Results (from the past 240 hours)  MRSA Next Gen by PCR, Nasal     Status: None   Collection Time: 07/23/23  6:51 PM   Specimen: Nasal Mucosa; Nasal Swab  Result Value Ref Range Status   MRSA by PCR Next Gen NOT DETECTED NOT DETECTED Final    Comment: (NOTE) The GeneXpert MRSA Assay (FDA approved for NASAL specimens only), is one component of a comprehensive MRSA colonization surveillance program. It is not intended to diagnose MRSA infection nor to guide or monitor treatment for MRSA infections. Test performance is not FDA approved in patients less than 11 years old. Performed at [facility], [address], [place], [place] [address]      Radiology Studies: No results found.   Scheduled Meds:  Chlorhexidine Gluconate Cloth  6 each Topical Daily   enoxaparin (LOVENOX) injection  40 mg Subcutaneous QHS   folic acid  1 mg Oral Daily   mirtazapine  30 mg Oral QHS   multivitamin with minerals  1 tablet Oral Daily   pantoprazole  40 mg Oral Daily   phenobarbital  64.8 mg Oral Q8H   Followed by   Melene Muller ON 08/02/2023] phenobarbital  32.4 mg Oral Q8H   thiamine (VITAMIN B1) injection  100 mg Intravenous Q24H   Or   thiamine  100 mg Oral Q24H   Continuous Infusions:   LOS: 4 days   Burnadette Pop, MD [facility] P3/10/2023, 10:20 AM

## 2023-08-01 NOTE — Progress Notes (Signed)
 Report called. Patient & sitter notified of transfer. Belongings collected. Pt stable at time of transfer

## 2023-08-01 NOTE — Progress Notes (Signed)
 NAME:  Jermaine [name], MRN:  [id number], DOB:  [birth year], LOS: 4 ADMISSION DATE:  07/28/2023, CONSULTATION DATE:  3/3 REFERRING MD:  Sharl Ma, CHIEF COMPLAINT:  Alcohol withdrawal   History of Present Illness:  Jermaine [name] is a 58 year old male with history of alcohol abuse/dependence, delirium tremens and recurrent admissions for alcohol withdrawal and leaving AMA who is admitted for alcohol withdrawal initially started on CIWA and librium taper. He required initiation of precedex drip which he has been on for 24 hours. PCCM consulted for severe alcohol withdrawal.   Pertinent  Medical History  Alcohol use disorder, anxiety, depression   Significant Hospital Events: Including procedures, antibiotic start and stop dates in addition to other pertinent events   3/2: admit SDU, precedex gtt for etoh w/d 3/3: phenobarb 1g load then taper, dc librium taper  3/4: agitation this AM increased hallucinations requiring IV dose of phenobarb, off dex, additional phenobarb dose 3/5 hallucinating, pheno  Interim History / Subjective:  Awake, complains of head, shoulder and everywhere pain.  Denies any visual/ auditory hallucinations thus far. CIWA up to 20 last evening but since slept well and has not needed prn ativan today thus far  Objective   Blood pressure 105/71, pulse 68, temperature 97.6 F (36.4 C), temperature source Axillary, resp. rate 19, height 5\' 10"  (1.778 m), weight 90.7 kg, SpO2 97%.        Intake/Output Summary (Last 24 hours) at 08/01/2023 6948 Last data filed at 07/31/2023 1745 Gross per 24 hour  Intake 390 ml  Output 500 ml  Net -110 ml   Filed Weights   07/28/23 0658  Weight: 90.7 kg    Examination: General:  Older adult male lying in bed in NAD, safety sitter at bedside HEENT: MM pink/moist, pupils 4/r, anicteric  Neuro:  Awake, calm, oriented x 3, MAE, mild hand tremor CV: rr, NSR, no murmur PULM:  non labored, clear, room air GI: soft, bs+, NT, voids Extremities:  warm/dry, no tibial edema  Skin: no rashes    Resolved Hospital Problem list    Assessment & Plan:  Alcohol withdrawal  History of delirium tremens  Elevated transaminases  Depression/Anxiety  - remains off dex since 3/4 - con't oral phenobarb taper  - cont CIWA with prn ativan - prn hydroxyzine for agitation - thiamine, folic acid, MVI - safety sitter per primary  - psych seen 3/4, recs for inpt psych admit when medically stable, IVC if attempts to leave AMA - remainder per primary  Nothing further to add.  PCCM will sign off.  Please call us back if we can be of any further assistance.    Best Practice (right click and "Reselect all SmartList Selections" daily)   Per primary team   Labs   CBC: Recent Labs  Lab 07/26/23 0311 07/27/23 0310 07/28/23 0715 07/29/23 0322  WBC 5.5 6.6 14.6* 10.8*  NEUTROABS  --   --  11.9*  --   HGB 15.8 15.0 17.4* 13.7  HCT 48.3 45.6 51.4 42.6  MCV 97.6 98.1 94.1 100.0  PLT 112* 116* 151 106*    Basic Metabolic Panel: Recent Labs  Lab 07/27/23 0310 07/28/23 0715 07/29/23 0322 07/30/23 0251 07/31/23 0828 08/01/23 0246  NA 137 141 138 137 139 137  K 3.5 3.8 3.5 3.2* 3.6 3.8  CL 101 96* 101 99 103 100  CO2 25 25 28 27 27 27   GLUCOSE 91 105* 101* 130* 90 91  BUN 15 13 20 17 10  11  CREATININE 0.53* 1.00 0.83 0.74 0.79 0.72  CALCIUM 8.5* 9.6 8.8* 8.3* 8.7* 8.8*  MG 1.9 1.8  --   --   --   --   PHOS 3.4  --   --   --   --   --    GFR: Estimated Creatinine Clearance: 115.4 mL/min (by C-G formula based on SCr of 0.72 mg/dL). Recent Labs  Lab 07/26/23 0311 07/27/23 0310 07/28/23 0715 07/29/23 0322  WBC 5.5 6.6 14.6* 10.8*    Liver Function Tests: Recent Labs  Lab 07/27/23 0310 07/28/23 0715 07/29/23 0322 07/30/23 0251 07/31/23 0828  AST 126* 116* 55* 44* 35  ALT 164* 146* 87* 72* 61*  ALKPHOS 62 78 61 65 53  BILITOT 1.0 1.1 1.2 0.7 1.2  PROT 5.7* 8.0 6.1* 6.0* 6.3*  ALBUMIN 3.3* 4.5 3.4* 3.4* 3.5   Recent  Labs  Lab 07/28/23 0715  LIPASE 33   No results for input(s): "AMMONIA" in the last 168 hours.  ABG    Component Value Date/Time   PHART 7.4 01/18/2023 0950   PCO2ART 38 01/18/2023 0950   PO2ART 96 01/18/2023 0950   HCO3 23.5 01/18/2023 0950   ACIDBASEDEF 1.1 01/18/2023 0950   O2SAT 99.5 01/18/2023 0950     Coagulation Profile: No results for input(s): "INR", "PROTIME" in the last 168 hours.  Cardiac Enzymes: Recent Labs  Lab 07/27/23 0310  CKTOTAL 530*    HbA1C: Hgb A1c MFr Bld  Date/Time Value Ref Range Status  01/16/2023 02:57 AM 5.4 4.8 - 5.6 % Final    Comment:    (NOTE) Pre diabetes:          5.7%-6.4%  Diabetes:              >6.4%  Glycemic control for   <7.0% adults with diabetes   03/20/2019 03:46 AM 5.4 4.8 - 5.6 % Final    Comment:    (NOTE) Pre diabetes:          5.7%-6.4% Diabetes:              >6.4% Glycemic control for   <7.0% adults with diabetes     CBG: No results for input(s): "GLUCAP" in the last 168 hours.  Past Medical History:  He,  has a past medical history of Alcohol abuse, Anxiety, Class 1 obesity (09/14/2021), and Depression.   Surgical History:   Past Surgical History:  Procedure Laterality Date   HERNIA REPAIR       Social History:   reports that he has never smoked. He has never been exposed to tobacco smoke. He has never used smokeless tobacco. He reports current alcohol use of about 20.0 standard drinks of alcohol per week. He reports that he does not currently use drugs.   Family History:  His family history is not on file. He was adopted.   Allergies Allergies  Allergen Reactions   Ketamine     Hx of causing fever in 2024    Codeine Itching     Home Medications  Prior to Admission medications   Medication Sig Start Date End Date Taking? Authorizing Provider  folic acid (FOLVITE) 1 MG tablet Take 1 tablet (1 mg total) by mouth daily. 01/28/23  Yes Danford, Earl Lites, MD  hydrOXYzine (ATARAX) 25 MG  tablet Take 50 mg by mouth every 8 (eight) hours as needed for anxiety. 06/10/23  Yes [provider]  mirtazapine (REMERON) 30 MG tablet Take 30 mg by mouth at bedtime.  Yes [provider]  thiamine (VITAMIN B-1) 100 MG tablet Take 1 tablet (100 mg total) by mouth daily. 05/20/22  Yes [name], MD     Critical care time: n/a       Posey Boyer, MSN, AG-ACNP-BC [facility] 08/01/2023, 8:11 AM  See Amion for pager If no response to pager , please call [phone] until 7pm After 7:00 pm call Elink  [phone]

## 2023-08-02 DIAGNOSIS — F10930 Alcohol use, unspecified with withdrawal, uncomplicated: Secondary | ICD-10-CM | POA: Diagnosis not present

## 2023-08-02 DIAGNOSIS — F339 Major depressive disorder, recurrent, unspecified: Secondary | ICD-10-CM

## 2023-08-02 DIAGNOSIS — F10931 Alcohol use, unspecified with withdrawal delirium: Secondary | ICD-10-CM | POA: Diagnosis not present

## 2023-08-02 MED ORDER — MIRTAZAPINE 30 MG PO TABS: 30.0000 mg | ORAL_TABLET | Freq: Every day | ORAL | 0 refills | Status: DC

## 2023-08-02 MED ORDER — HYDROXYZINE HCL 25 MG PO TABS: 50.0000 mg | ORAL_TABLET | Freq: Three times a day (TID) | ORAL | 0 refills | Status: DC | PRN

## 2023-08-02 MED ORDER — FOLIC ACID 1 MG PO TABS: 1.0000 mg | ORAL_TABLET | Freq: Every day | ORAL | 0 refills | Status: DC

## 2023-08-02 MED ORDER — PANTOPRAZOLE SODIUM 40 MG PO TBEC: 40.0000 mg | DELAYED_RELEASE_TABLET | Freq: Every day | ORAL | 0 refills | Status: DC

## 2023-08-02 MED ORDER — VITAMIN B-1 100 MG PO TABS: 100.0000 mg | ORAL_TABLET | Freq: Every day | ORAL | 0 refills | Status: DC

## 2023-08-02 NOTE — TOC Transition Note (Signed)
 Transition of Care [facility]) - Discharge Note   Patient Details  Name: Jermaine [name] MRN: [id number] Date of Birth: Dec 10, 1965  [facility] CM/SW Contact:  Beckie Busing, RN Phone Number:[phone]  08/02/2023, 10:18 AM   Clinical Narrative:    CM received message from MD requesting info for substance abuse rehab resources. CM at bedside to discuss referral with patient. Patient states that he has all the tools he needs to stop drinking but seems to fail every time. Resources have been added to AVS. No other needs noted at this time.      Barriers to Discharge: Continued Medical Work up   Patient Goals and CMS Choice Patient states their goals for this hospitalization and ongoing recovery are:: Discharge home   Choice offered to / list presented to : NA      Discharge Placement                       Discharge Plan and Services Additional resources added to the After Visit Summary for   In-house Referral: Clinical Social Work   Post Acute Care Choice: NA          DME Arranged: N/A DME Agency: NA                  Social Drivers of Health (SDOH) Interventions SDOH Screenings   Food Insecurity: Patient Declined (07/28/2023)  Housing: Patient Declined (07/28/2023)  Transportation Needs: Patient Declined (07/28/2023)  Utilities: Patient Declined (07/28/2023)  Recent Concern: Utilities - At Risk (05/02/2023)  Tobacco Use: Low Risk  (07/30/2023)     Readmission Risk Interventions    07/29/2023    3:07 PM 05/13/2023    2:46 PM 01/17/2023    4:03 PM  Readmission Risk Prevention Plan  Transportation Screening Complete Complete Complete  PCP or Specialist Appt within 5-7 Days   Complete  PCP or Specialist Appt within 3-5 Days  Complete   Home Care Screening   Complete  Medication Review (RN CM)   Complete  HRI or Home Care Consult  Complete   Social Work Consult for Recovery Care Planning/Counseling  Complete   Palliative Care Screening  Not Applicable    Medication Review Oceanographer) Complete Complete   HRI or Home Care Consult Complete    SW Recovery Care/Counseling Consult Complete    Palliative Care Screening Not Applicable    Skilled Nursing Facility Not Applicable

## 2023-08-02 NOTE — Discharge Summary (Signed)
 Physician Discharge Summary  Jermaine [name] NWG:[id number] DOB: Sep 30, 1965 DOA: 07/28/2023  PCP: Patient, No Pcp Per  Admit date: 07/28/2023 Discharge date: 08/02/2023  Admitted From: Home Disposition:  Home  Discharge Condition:Stable CODE STATUS:FULL Diet recommendation:  Regular  Brief/Interim Summary: Patient is a 58 year old male with history of chronic alcohol abuse, delirium tremens, recurrent hospitalization for the same with history of leaving AMA presented with symptoms of alcohol withdrawal, vomiting.  He was last hospitalized on 1/28- 2/14 when he left AMA and again admitted on 2/25 -3/1 and left AMA.  On his last hospitalization, he was started on pressor breaks and required phenobarbital taper, physical restraints.  Admitted this time again with severe alcohol withdrawal symptoms, hallucinations, delirium treatments.  PCCM consulted.  Started on Precedex drip.  Now on  phenobarbital taper.  Psychiatry also consulted and was initially recommending inpatient psychiatry admission after medical clearance but now saying he does not need inpatient psych at.  Psychiatry cleared for discharge .this morning he is alert and oriented, hemodynamically stable.  Medically stable for discharge to previous environment.  Following problems were addressed during the hospitalization:    Chronic alcohol abuse/severe alcohol withdrawal/hallucinations/delirium tremens: Started on Precedex drip initially.  PCCM was following. Started on phenobarbital.  On thiamine and folic acid. Psychiatry was consulted on 3/4.  Recommended inpatient psychiatry admission after he is cleared medically for discharge but now clearing discharge back to previous environment. This morning he is cooperative, calm.  Fully alert and oriented.  He is interested in alcohol rehabilitation.  TOC consulted.   Depression: Psychiatry recommended Remeron 30 mg daily.   Hypokalemia: Supplemented  and corrected   Transaminitis: Likely from  alcohol hepatitis, resolved   Chest pain: Resolved.  No further workup recommended.  Cardiology was consulted.  Troponins were normal.  Echocardiogram was normal about 6 months ago.Started on Protonix for suspicion of GERD   Discharge Diagnoses:  Principal Problem:   Alcohol withdrawal (HCC) Active Problems:   Alcohol abuse with intoxication [facility])    Discharge Instructions  Discharge Instructions     Diet general   Complete by: As directed    Discharge instructions   Complete by: As directed    1)Please take your medications as instructed 2)Quit alcohol.  Please follow-up with outpatient alcohol rehabilitation services   Increase activity slowly   Complete by: As directed       Allergies as of 08/02/2023       Reactions   Ketamine    Hx of causing fever in 2024    Codeine Itching        Medication List     TAKE these medications    folic acid 1 MG tablet Commonly known as: FOLVITE Take 1 tablet (1 mg total) by mouth daily.   hydrOXYzine 25 MG tablet Commonly known as: ATARAX Take 2 tablets (50 mg total) by mouth every 8 (eight) hours as needed for anxiety.   mirtazapine 30 MG tablet Commonly known as: REMERON Take 1 tablet (30 mg total) by mouth at bedtime.   pantoprazole 40 MG tablet Commonly known as: Protonix Take 1 tablet (40 mg total) by mouth daily.   thiamine 100 MG tablet Commonly known as: Vitamin B-1 Take 1 tablet (100 mg total) by mouth daily.        Allergies  Allergen Reactions   Ketamine     Hx of causing fever in 2024    Codeine Itching    Consultations: Cardiology, psychiatry, PCCM   Procedures/Studies: ECHOCARDIOGRAM  COMPLETE Result Date: 07/29/2023    ECHOCARDIOGRAM REPORT   Patient Name:   Jermaine [name] Date of Exam: 07/29/2023 Medical Rec #:  [id number]     Height:       70.0 in Accession #:    [id number]    Weight:       200.0 lb Date of Birth:  [birth year]     BSA:          2.087 m Patient Age:    57 years      BP:            134/84 mmHg Patient Gender: M             HR:           97 bpm. Exam Location:  Inpatient Procedure: 2D Echo, Cardiac Doppler, Color Doppler and Intracardiac            Opacification Agent (Both Spectral and Color Flow Doppler were            utilized during procedure). Indications:    Chest Pain R07.9  History:        Patient has prior history of Echocardiogram examinations, most                 recent 01/18/2023.  Sonographer:    Darlys Gales Referring Phys: [id number] ZANE ADAMS IMPRESSIONS  1. Left ventricular ejection fraction, by estimation, is 55 to 60%. The left ventricle has normal function. The left ventricle has no regional wall motion abnormalities. There is mild concentric left ventricular hypertrophy. Left ventricular diastolic parameters are consistent with Grade I diastolic dysfunction (impaired relaxation).  2. Right ventricular systolic function is normal. The right ventricular size is normal. Tricuspid regurgitation signal is inadequate for assessing PA pressure.  3. The mitral valve is normal in structure. No evidence of mitral valve regurgitation.  4. The aortic valve was not well visualized. Aortic valve regurgitation is not visualized. No aortic stenosis is present.  5. IVC not visualized.  6. Technically difficult study with poor acoustic windows. FINDINGS  Left Ventricle: Left ventricular ejection fraction, by estimation, is 55 to 60%. The left ventricle has normal function. The left ventricle has no regional wall motion abnormalities. Definity contrast agent was given IV to delineate the left ventricular  endocardial borders. The left ventricular internal cavity size was normal in size. There is mild concentric left ventricular hypertrophy. Left ventricular diastolic parameters are consistent with Grade I diastolic dysfunction (impaired relaxation). Right Ventricle: The right ventricular size is normal. No increase in right ventricular wall thickness. Right ventricular systolic function is  normal. Tricuspid regurgitation signal is inadequate for assessing PA pressure. Left Atrium: Left atrial size was normal in size. Right Atrium: Right atrial size was normal in size. Pericardium: There is no evidence of pericardial effusion. Mitral Valve: The mitral valve is normal in structure. No evidence of mitral valve regurgitation. Tricuspid Valve: The tricuspid valve is not well visualized. Tricuspid valve regurgitation is not demonstrated. Aortic Valve: The aortic valve was not well visualized. Aortic valve regurgitation is not visualized. No aortic stenosis is present. Aortic valve mean gradient measures 7.0 mmHg. Aortic valve peak gradient measures 10.5 mmHg. Aortic valve area, by VTI measures 2.58 cm. Pulmonic Valve: The pulmonic valve was not well visualized. Pulmonic valve regurgitation is not visualized. Aorta: The aortic root is normal in size and structure. Venous: IVC not visualized. IAS/Shunts: No atrial level shunt detected by color flow Doppler.  LEFT VENTRICLE PLAX 2D LVIDd:  5.40 cm   Diastology LVIDs:         3.50 cm   LV e' medial:    8.70 cm/s LV PW:         1.10 cm   LV E/e' medial:  8.1 LV IVS:        1.20 cm   LV e' lateral:   10.90 cm/s LVOT diam:     1.90 cm   LV E/e' lateral: 6.4 LV SV:         56 LV SV Index:   27 LVOT Area:     2.84 cm  RIGHT VENTRICLE RV S prime:     20.90 cm/s TAPSE (M-mode): 3.2 cm LEFT ATRIUM             Index        RIGHT ATRIUM           Index LA Vol (A2C):   31.4 ml 15.04 ml/m  RA Area:     16.50 cm LA Vol (A4C):   52.3 ml 25.06 ml/m  RA Volume:   44.20 ml  21.18 ml/m LA Biplane Vol: 43.6 ml 20.89 ml/m  AORTIC VALVE AV Area (Vmax):    2.63 cm AV Area (Vmean):   2.25 cm AV Area (VTI):     2.58 cm AV Vmax:           162.00 cm/s AV Vmean:          122.000 cm/s AV VTI:            0.219 m AV Peak Grad:      10.5 mmHg AV Mean Grad:      7.0 mmHg LVOT Vmax:         150.00 cm/s LVOT Vmean:        96.700 cm/s LVOT VTI:          0.199 m LVOT/AV VTI  ratio: 0.91 MITRAL VALVE MV Area (PHT): 3.83 cm    SHUNTS MV Decel Time: 198 msec    Systemic VTI:  0.20 m MV E velocity: 70.30 cm/s  Systemic Diam: 1.90 cm MV A velocity: 80.10 cm/s MV E/A ratio:  0.88 Dalton McleanMD Electronically signed by Wilfred Lacy Signature Date/Time: 07/29/2023/3:07:42 PM    Final    DG Chest Portable 1 View Result Date: 07/28/2023 CLINICAL DATA:  Chest pain. EXAM: PORTABLE CHEST 1 VIEW COMPARISON:  07/01/2023 FINDINGS: Heart size and mediastinal contours are unremarkable. There is no pleural fluid, interstitial edema or airspace disease. Visualized osseous structures are unremarkable. IMPRESSION: No active disease. Electronically Signed   By: Signa Kell M.D.   On: 07/28/2023 08:26      Subjective: Patient seen and examined at bedside today.  Hemodynamically stable.  Alert and oriented.  Calm and cooperative.  Medically stable for discharge.  Discussed about alcohol cessation.  He says he  is interested on alcohol rehabilitation  Discharge Exam: Vitals:   08/02/23 0345 08/02/23 0639  BP: 98/67 113/82  Pulse: 70 64  Resp: 18 18  Temp: 97.8 F (36.6 C) 98.9 F (37.2 C)  SpO2: 97% 100%   Vitals:   08/01/23 1754 08/01/23 2158 08/02/23 0345 08/02/23 0639  BP: 94/72 110/70 98/67 113/82  Pulse: 78 77 70 64  Resp: 16 18 18 18   Temp: 99.3 F (37.4 C) 98.9 F (37.2 C) 97.8 F (36.6 C) 98.9 F (37.2 C)  TempSrc: Oral Oral Oral Oral  SpO2: 100% 96% 97% 100%  Weight:  Height:        General: Pt is alert, awake, not in acute distress Cardiovascular: RRR, S1/S2 +, no rubs, no gallops Respiratory: CTA bilaterally, no wheezing, no rhonchi Abdominal: Soft, NT, ND, bowel sounds + Extremities: no edema, no cyanosis    The results of significant diagnostics from this hospitalization (including imaging, microbiology, ancillary and laboratory) are listed below for reference.     Microbiology: Recent Results (from the past 240 hours)  MRSA Next Gen by  PCR, Nasal     Status: None   Collection Time: 07/23/23  6:51 PM   Specimen: Nasal Mucosa; Nasal Swab  Result Value Ref Range Status   MRSA by PCR Next Gen NOT DETECTED NOT DETECTED Final    Comment: (NOTE) The GeneXpert MRSA Assay (FDA approved for NASAL specimens only), is one component of a comprehensive MRSA colonization surveillance program. It is not intended to diagnose MRSA infection nor to guide or monitor treatment for MRSA infections. Test performance is not FDA approved in patients less than 57 years old. Performed at [facility], [address] [place], [place] [address]      Labs: BNP (last 3 results) No results for input(s): "BNP" in the last 8760 hours. Basic Metabolic Panel: Recent Labs  Lab 07/27/23 0310 07/28/23 0715 07/29/23 0322 07/30/23 0251 07/31/23 0828 08/01/23 0246  NA 137 141 138 137 139 137  K 3.5 3.8 3.5 3.2* 3.6 3.8  CL 101 96* 101 99 103 100  CO2 25 25 28 27 27 27   GLUCOSE 91 105* 101* 130* 90 91  BUN 15 13 20 17 10 11   CREATININE 0.53* 1.00 0.83 0.74 0.79 0.72  CALCIUM 8.5* 9.6 8.8* 8.3* 8.7* 8.8*  MG 1.9 1.8  --   --   --   --   PHOS 3.4  --   --   --   --   --    Liver Function Tests: Recent Labs  Lab 07/27/23 0310 07/28/23 0715 07/29/23 0322 07/30/23 0251 07/31/23 0828  AST 126* 116* 55* 44* 35  ALT 164* 146* 87* 72* 61*  ALKPHOS 62 78 61 65 53  BILITOT 1.0 1.1 1.2 0.7 1.2  PROT 5.7* 8.0 6.1* 6.0* 6.3*  ALBUMIN 3.3* 4.5 3.4* 3.4* 3.5   Recent Labs  Lab 07/28/23 0715  LIPASE 33   No results for input(s): "AMMONIA" in the last 168 hours. CBC: Recent Labs  Lab 07/27/23 0310 07/28/23 0715 07/29/23 0322  WBC 6.6 14.6* 10.8*  NEUTROABS  --  11.9*  --   HGB 15.0 17.4* 13.7  HCT 45.6 51.4 42.6  MCV 98.1 94.1 100.0  PLT 116* 151 106*   Cardiac Enzymes: Recent Labs  Lab 07/27/23 0310  CKTOTAL 530*   BNP: Invalid input(s): "POCBNP" CBG: No results for input(s): "GLUCAP" in the last 168  hours. D-Dimer No results for input(s): "DDIMER" in the last 72 hours. Hgb A1c No results for input(s): "HGBA1C" in the last 72 hours. Lipid Profile No results for input(s): "CHOL", "HDL", "LDLCALC", "TRIG", "CHOLHDL", "LDLDIRECT" in the last 72 hours. Thyroid function studies No results for input(s): "TSH", "T4TOTAL", "T3FREE", "THYROIDAB" in the last 72 hours.  Invalid input(s): "FREET3" Anemia work up No results for input(s): "VITAMINB12", "FOLATE", "FERRITIN", "TIBC", "IRON", "RETICCTPCT" in the last 72 hours. Urinalysis    Component Value Date/Time   COLORURINE YELLOW 05/12/2023 1504   APPEARANCEUR CLEAR 05/12/2023 1504   LABSPEC 1.013 05/12/2023 1504   PHURINE 5.0 05/12/2023 1504  GLUCOSEU NEGATIVE 05/12/2023 1504   HGBUR NEGATIVE 05/12/2023 1504   BILIRUBINUR NEGATIVE 05/12/2023 1504   KETONESUR 20 (A) 05/12/2023 1504   PROTEINUR NEGATIVE 05/12/2023 1504   NITRITE NEGATIVE 05/12/2023 1504   LEUKOCYTESUR NEGATIVE 05/12/2023 1504   Sepsis Labs Recent Labs  Lab 07/27/23 0310 07/28/23 0715 07/29/23 0322  WBC 6.6 14.6* 10.8*   Microbiology Recent Results (from the past 240 hours)  MRSA Next Gen by PCR, Nasal     Status: None   Collection Time: 07/23/23  6:51 PM   Specimen: Nasal Mucosa; Nasal Swab  Result Value Ref Range Status   MRSA by PCR Next Gen NOT DETECTED NOT DETECTED Final    Comment: (NOTE) The GeneXpert MRSA Assay (FDA approved for NASAL specimens only), is one component of a comprehensive MRSA colonization surveillance program. It is not intended to diagnose MRSA infection nor to guide or monitor treatment for MRSA infections. Test performance is not FDA approved in patients less than 23 years old. Performed at [facility], [address], [place], [place] [address]     Please note: You were cared for by a hospitalist during your hospital stay. Once you are discharged, your primary care physician will handle any further medical  issues. Please note that NO REFILLS for any discharge medications will be authorized once you are discharged, as it is imperative that you return to your primary care physician (or establish a relationship with a primary care physician if you do not have one) for your post hospital discharge needs so that they can reassess your need for medications and monitor your lab values.    Time coordinating discharge: 40 minutes  SIGNED:   Burnadette Pop, MD  [facility] 08/02/2023, 10:18 AM Pager [phone]  If 7PM-7AM, please contact night-coverage [email or web address] Password [email or web address]

## 2023-08-02 NOTE — Consult Note (Signed)
 [facility]  Patient Name: .Alessandro Griep  MRN: [id number]  DOB: 06/06/1965  Consult Order details:  Orders (From admission, onward)     Start     Ordered   07/30/23 0740  IP CONSULT TO PSYCHIATRY       Ordering Provider: Meredeth Ide, MD  Provider:  (Not yet assigned)  Question Answer Comment  Location [facility]   Reason for Consult? Depression, alcohol use disorder      07/30/23 0739             Mode of Visit: In person    Psychiatry Consult Evaluation  Service Date: August 02, 2023 LOS:  LOS: 5 days  Chief Complaint Alcohol intoxication with Alcohol withdrawal  Primary Psychiatric Diagnoses  Alcohol withdrawal 2.  Recurrent Major Depressive disorder 3.  Alcohol use Disorder  Assessment  Avaneesh Pepitone is a 58 y.o. male admitted: Presented to the EDfor 07/28/2023  6:53 AM for Alcohol withdrawal. He carries the psychiatric diagnoses of Alcohol use disorder,DT, Alcohol withdrawal Seizures, Depression, anxiety, and has a past medical history of  Hepatic Steatosis, Sinus Tachycardia.   His initial presentation of tremor, agitation is most consistent with alcohol withdrawal symptoms and intoxication. He meets criteria for inpatient alcohol detox treatment after stabilization in ICU based on presentation.  Current outpatient psychotropic medications include Remeron and historically he has had a unknown response to these medications. He was not compliant with medications prior to admission as evidenced by his report. On initial examination, patient was still shaky, unsteady on his feet . Please see plan below for detailed recommendations.   07/31/2023: Patient seen today.  He had to be awakened.  He continues on Librium taper with increased scoring on CIWA.  Per nursing/MAR that patient required 10 mg of Ativan overnight in addition to Librium.  Patient continues to endorse withdrawal symptoms and severe alcohol cravings.  He does acknowledge  hallucinations with withdrawal, but denies any overnight.  Nursing reports that patient does at times appear to respond to internal stimuli.  Patient is agreeable to a trial of Haldol as needed for psychosis, or increasing agitation.  At this time, patient continues in precontemplative mode regarding discharge planning and substance use rehabilitation.  He essentially declines residential rehabilitation or CDIOP.  He states that he intends to go to AA groups, get a workbook, and work with a sponsor.  He specifically denies any SI, HI today.   08/01/23: patient continues to deny SI, HI< AH or VH. He required 2 doses of ativan this AM for elevated CIWA. He continues to decline voluntary admission to rehab and he does not meet Stone Criteria for IVC for at this time.   08/02/2023 Patient seen laying in bed on my approach accompanied by sitter at bedside. He reports that he is doing fine this morning and looking forward to discharge. He plans on going to an AA meeting when he leaves the hospital. We discussed the patient going to rehab for substance abuse but he is not interested in treatment. He states that he has the tools to maintain sobriety and he just needs to use them. He denies any SI/HI/AVH or symptoms of alcohol withdrawal.  Diagnoses:  Active Hospital problems: Principal Problem:   Alcohol withdrawal (HCC) Active Problems:   Alcohol abuse with intoxication (HCC)    Plan   ## Psychiatric Medication Recommendations:  --Continue Remeron 30 mg po daily for depression -- Use Haldol 2 mg TID PRN for  agitation.  If patient is unable to take p.o. for agitation is such that patient is a danger to himself or others, Haldol 5 mg IM can be administered.  ## Medical Decision Making Capacity: Not specifically addressed in this encounter  ## Further Work-up:  - most recent EKG on 07/29/23 had QtC of 463 -- Pertinent labwork reviewed earlier this admission includes: TSH CMP   ## Disposition:-- There  are no psychiatric contraindications to discharge at this time - recommend referral to psychiatry upon discharge.   ## Behavioral / Environmental: - No specific recommendations at this time.     ## Safety and Observation Level:  - Based on my clinical evaluation, I estimate the patient to be at Low risk of self harm in the current setting. - At this time, we recommend  routine. This decision is based on my review of the chart including patient's history and current presentation, interview of the patient, mental status examination, and consideration of suicide risk including evaluating suicidal ideation, plan, intent, suicidal or self-harm behaviors, risk factors, and protective factors. This judgment is based on our ability to directly address suicide risk, implement suicide prevention strategies, and develop a safety plan while the patient is in the clinical setting. Please contact our team if there is a concern that risk level has changed.  CSSR Risk Category:C-SSRS RISK CATEGORY: No Risk  Suicide Risk Assessment: Patient has following modifiable risk factors for suicide: under treated depression , recklessness, medication noncompliance, and lack of access to outpatient mental health resources  Patient has following non-modifiable or demographic risk factors for suicide: male gender and psychiatric hospitalization Patient has the following protective factors against suicide: Access to outpatient mental health care, Supportive family, no history of suicide attempts, and no history of NSSIB  Thank you for this consult request. Recommendations have been communicated to the primary team.  We will sign off at this time.   Harlin Heys, DO       History of Present Illness  Relevant Aspects of Hospital ED Course:  Admitted on 07/28/2023 for Alcohol abuse, Delirium tremens   07/30/2023 patient is a 58 years old male seen today in ICU receiving treatment for Alcohol withdrawal symptoms -DT,  Delirium.  Chart is reviewed and patient discussed with DR Viviano Simas, Psychiatrist.  Patient is well known to [facility] long ER and Medical units.  Patient  has previous hx of Depression, anxiety and Alcohol use disorder.  Patient was admitted to ICU on the second of March for Medical detoxification.  Patient signed himself out of the hospital two days prior to this visit.  He came back again intoxicated and complained of withdrawal symptoms.   Psychiatry was consulted to see patient although he is still experiencing withdrawal symptoms and remains on Phenobarb taper.  He was seen in his room as he walked in from walking with PT staff.  His gait is unsteady and he had mild tremors of his fingers.  He engaged in meaningful conversation.  Patient admitted having issues with Alcoholism stating he started drinking alcohol at age 71.  He also admitted been to rehab three times and really did well in the program but relapses after a month or less of leaving the facility.  He reports using Alcohol to treat depression and loneliness.  He reports using alcohol to cure loneliness and when he bored.  He reports that he is now ready to stop drinking alcohol.  His choice of Alcohol is Liquor  which he drinks  half a pint a day.  Patient is not currently taking is Remeron for depression and states he uses Alcohol to treat himself.  He denies SI/HI/AVH.  Patient declined offer to be admitted to inpatient Psychiatry unit  for further detox treatment and treatment of depression.  Patient states he has done every treatment in the past but the problem is that he relapses and does not follow through.  He has no outpatient Psychiatrist but his PCP prescribed his Remeron for him. Patient is still on Phenobarb taper and receiving Ativan for withdrawal as well.  He is still in need for advanced treatment in ICU Psychiatry will continue to see patient until he stable to be discharged home or come to Psychiatry unit.  07/31/2023: Patient not  medically stable.  He continues to have withdrawal symptoms and cravings.  He has had some hallucinations and will be started on as needed Haldol.  He specifically denies any SI, HI.  He is uncertain of discharge plans, but thinks he would like to attend AA.  He is currently unemployed and feels he needs time during the day to seek employment.  He states that he does have a place to live after discharge.  08/01/23: patient seen at bedside today. He wsa calm and pleasant but had some mild noticeable hand tremors. He states that he is ready to go for a walk around the unit with his team to feel better. He states that he realizes he has to quit alcohol for good so he can get his life back together and find a job so he is not sitting around all day bored and drinking. He states that he plan to attend AA twice a day every day and that he is "embarrassed" that he will pick up a beginner chip at next AA meeting.  He realizes his alcohol use as a health issue for him however he states he "knows what he has to do" and does not need to go to rehab at this time.  He denied any suicidal or homicidal ideations and specifically states he "never thought about harming himself".  He denied any Depression, Anxiety, AH or VH.   08/02/2023 Patient seen laying in bed on my approach accompanied by sitter at bedside. He reports that he is doing fine this morning and looking forward to discharge. He plans on going to an AA meeting when he leaves the hospital. We discussed the patient going to rehab for substance abuse but he is not interested in treatment. He states that he has the tools to maintain sobriety and he just needs to use them. He denies any SI/HI/AVH or symptoms of alcohol withdrawal.  Psych ROS:  Depression: No Anxiety:  No Mania (lifetime and current): na Psychosis: (lifetime and current): Yes, with withdrawal  Collateral information:  Patient does not want his mother or sister called for collateral.  Review  of Systems  Constitutional: Negative.   HENT: Negative.    Eyes: Negative.   Respiratory: Negative.    Cardiovascular: Negative.   Gastrointestinal: Negative.   Genitourinary: Negative.   Musculoskeletal: Negative.   Skin: Negative.   Neurological: Negative.   Endo/Heme/Allergies: Negative.   Psychiatric/Behavioral:  Negative for depression, hallucinations and suicidal ideas. The patient is not nervous/anxious.      Psychiatric and Social History  Psychiatric History:  Information collected from Patient  Prev Dx/Sx: severe alcohol use disorder, seizures/DT previously, mood disorder, Depression, anxiety Current Psych Provider: none Home Meds (current): Remeron  Previous Med Trials:  unknown Therapy: denies  Prior Psych Hospitalization: multiple for Alcohol intoxication  Prior Self Harm: denies Prior Violence: yes, when intoxicated  Family Psych History: Denies Family Hx suicide: denies  Social History:  Developmental Hx: wnl Educational Hx: unknown Occupational Hx: [employer] job, not currently working 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    Substance History Alcohol: yes  Type of alcohol Liquor Last Drink 07/23/2023 Number of drinks per day varies History of alcohol withdrawal seizures yes History of DT's yes Tobacco: Denies Illicit drugs: denies Prescription drug abuse: denies [facility]hab hx: [facility]; reports he did not find it helpful.   Exam Findings  Physical Exam:  Vital Signs:  Temp:  [97.8 F (36.6 C)-99.3 F (37.4 C)] 98.9 F (37.2 C) (03/07 0639) Pulse Rate:  [63-84] 64 (03/07 0639) Resp:  [6-24] 18 (03/07 0639) BP: (69-115)/(41-88) 113/82 (03/07 0639) SpO2:  [92 %-100 %] 100 % (03/07 0639) Weight:  [87.9 kg] 87.9 kg (03/06 1741) Blood pressure 113/82, pulse 64, temperature 98.9 F (37.2 C), temperature source  Oral, resp. rate 18, height 5\' 10"  (1.778 m), weight 87.9 kg, SpO2 100%. Body mass index is 27.81 kg/m.  Physical Exam Vitals and nursing note reviewed.  Constitutional:      Appearance: Normal appearance.  HENT:     Nose: Nose normal.  Cardiovascular:     Rate and Rhythm: Normal rate and regular rhythm.  Pulmonary:     Effort: Pulmonary effort is normal.  Musculoskeletal:        General: Normal range of motion.     Comments: Unsteady gait  Skin:    General: Skin is dry.  Neurological:     General: No focal deficit present.     Mental Status: He is alert and oriented to person, place, and time.  Psychiatric:        Attention and Perception: He is attentive. He does not perceive auditory or visual hallucinations.        Mood and Affect: Mood is not anxious or depressed. Affect is not labile.        Speech: Speech normal.        Behavior: Behavior normal. Behavior is cooperative.        Thought Content: Thought content normal.        Cognition and Memory: Cognition is not impaired.   Patient has a Recruitment consultant due to unsteady gait  Mental Status Exam: General Appearance: obese white male dressed in hospital gown. Appears tired.   Orientation:  Full (Time, Place, and Person)  Memory:  Immediate;   Fair Recent;   Fair Remote;   Fair  Concentration:  Concentration: Fair and Attention Span: Good  Recall:  Fair  Attention  Fair  Eye Contact:  Fair  Speech:  Normal Rate  Language:  Fair  Volume:  Normal  Mood: "good< I feel ready to go"  Affect:  Congruent, Full.   Thought Process:  Coherent and Linear  Thought Content:  Logical  Suicidal Thoughts:  No  Homicidal Thoughts:  No  Judgement:  Fair  Insight:  Shallow  Psychomotor Activity:  Tremor  Akathisia:  NA  Fund of Knowledge:  Fair      Other History   These have been pulled in through the EMR, reviewed, and updated if appropriate.  Family History:  The patient's family history is not on file. He was  adopted.  Medical History: Past Medical History:  Diagnosis Date   Alcohol abuse    Anxiety    Class 1 obesity 09/14/2021   Depression     Surgical History: Past Surgical History:  Procedure Laterality Date   HERNIA REPAIR       Medications:   Current Facility-Administered Medications:    albuterol (PROVENTIL) (2.5 MG/3ML) 0.083% nebulizer solution 2.5 mg, 2.5 mg, Nebulization, Q2H PRN, Kirby Crigler, Mir M, MD   alum & mag hydroxide-simeth (MAALOX/MYLANTA) 200-200-20 MG/5ML suspension 30 mL, 30 mL, Oral, Q4H PRN, Renford Dills, Amrit, MD   Chlorhexidine Gluconate Cloth 2 % PADS 6 each, 6 each, Topical, Daily, Kirby Crigler, Mir M, MD, 6 each at 08/01/23 0912   enoxaparin (LOVENOX) injection 40 mg, 40 mg, Subcutaneous, QHS, Kirby Crigler, Mir M, MD, 40 mg at 08/01/23 2119   folic acid (FOLVITE) tablet 1 mg, 1 mg, Oral, Daily, Kirby Crigler, Mir M, MD, 1 mg at 08/02/23 1058   haloperidol (HALDOL) tablet 2 mg, 2 mg, Oral, Q8H PRN, 2 mg at 08/01/23 2119 **OR** haloperidol lactate (HALDOL) injection 5 mg, 5 mg, Intramuscular, Q8H PRN, Mariel Craft, MD   ibuprofen (ADVIL) tablet 400 mg, 400 mg, Oral, Q6H PRN, Kirby Crigler, Mir M, MD, 400 mg at 08/01/23 2028   melatonin tablet 10 mg, 10 mg, Oral, QHS PRN, Anthoney Harada, NP, 10 mg at 08/01/23 2317   mirtazapine (REMERON) tablet 30 mg, 30 mg, Oral, QHS, Kirby Crigler, Mir M, MD, 30 mg at 08/01/23 2119   multivitamin with minerals tablet 1 tablet, 1 tablet, Oral, Daily, Kirby Crigler, Mir M, MD, 1 tablet at 08/02/23 1058   ondansetron (ZOFRAN) tablet 4 mg, 4 mg, Oral, Q6H PRN **OR** ondansetron (ZOFRAN) injection 4 mg, 4 mg, Intravenous, Q6H PRN, Kirby Crigler, Mir M, MD, 4 mg at 07/30/23 2007   Oral care mouth rinse, 15 mL, Mouth Rinse, PRN, Kirby Crigler, Mir M, MD   pantoprazole (PROTONIX) EC tablet 40 mg, 40 mg, Oral, Daily, Shade, Christine E, RPH, 40 mg at 08/02/23 1058   [EXPIRED] PHENobarbital (LUMINAL) tablet 97.2 mg, 97.2 mg, Oral, Q8H, 97.2 mg at 07/30/23  2134 **FOLLOWED BY** [COMPLETED] PHENobarbital (LUMINAL) tablet 64.8 mg, 64.8 mg, Oral, Q8H, 64.8 mg at 08/02/23 0603 **FOLLOWED BY** PHENobarbital (LUMINAL) tablet 32.4 mg, 32.4 mg, Oral, Q8H, Shade, Christine E, RPH   sodium chloride flush (NS) 0.9 % injection 10 mL, 10 mL, Intravenous, PRN, Sharl Ma, Sarina Ill, MD   thiamine (VITAMIN B1) injection 100 mg, 100 mg, Intravenous, Q24H, 100 mg at 07/28/23 1506 **OR** thiamine (VITAMIN B1) tablet 100 mg, 100 mg, Oral, Q24H, Kirby Crigler, Mir M, MD, 100 mg at 08/01/23 1605  Allergies: Allergies  Allergen Reactions   Ketamine     Hx of causing fever in 2024    Codeine Itching    Harlin Heys, DO  Total Time Spent in Direct Patient Care:  I personally spent 56 minutes on the unit in direct patient care. The direct patient care time included face-to-face time with the patient, reviewing the patient's chart, communicating with other professionals, and coordinating care. Greater than 50% of this time was spent in counseling or coordinating care with the patient regarding goals of hospitalization, psycho-education, and discharge planning needs.

## 2023-08-02 NOTE — Plan of Care (Signed)

## 2023-12-10 ENCOUNTER — Inpatient Hospital Stay (HOSPITAL_COMMUNITY)
Admission: EM | Admit: 2023-12-10 | Discharge: 2023-12-21 | DRG: 897 | Disposition: A | Discharge status: Home / Self Care | Attending: Internal Medicine | Admitting: Internal Medicine

## 2023-12-10 ENCOUNTER — Encounter (HOSPITAL_COMMUNITY): Payer: Self-pay

## 2023-12-10 ENCOUNTER — Other Ambulatory Visit: Payer: Self-pay

## 2023-12-10 DIAGNOSIS — F10231 Alcohol dependence with withdrawal delirium: Principal | ICD-10-CM | POA: Diagnosis present

## 2023-12-10 DIAGNOSIS — Y908 Blood alcohol level of 240 mg/100 ml or more: Secondary | ICD-10-CM | POA: Diagnosis present

## 2023-12-10 DIAGNOSIS — D751 Secondary polycythemia: Secondary | ICD-10-CM | POA: Diagnosis present

## 2023-12-10 DIAGNOSIS — F10931 Alcohol use, unspecified with withdrawal delirium: Principal | ICD-10-CM

## 2023-12-10 DIAGNOSIS — Z885 Allergy status to narcotic agent status: Secondary | ICD-10-CM

## 2023-12-10 DIAGNOSIS — D72829 Elevated white blood cell count, unspecified: Secondary | ICD-10-CM | POA: Diagnosis present

## 2023-12-10 DIAGNOSIS — F32A Depression, unspecified: Secondary | ICD-10-CM | POA: Diagnosis present

## 2023-12-10 DIAGNOSIS — F419 Anxiety disorder, unspecified: Secondary | ICD-10-CM | POA: Diagnosis present

## 2023-12-10 DIAGNOSIS — Z79899 Other long term (current) drug therapy: Secondary | ICD-10-CM

## 2023-12-10 DIAGNOSIS — Z888 Allergy status to other drugs, medicaments and biological substances status: Secondary | ICD-10-CM

## 2023-12-10 DIAGNOSIS — G8929 Other chronic pain: Secondary | ICD-10-CM | POA: Diagnosis present

## 2023-12-10 DIAGNOSIS — E860 Dehydration: Secondary | ICD-10-CM | POA: Diagnosis present

## 2023-12-10 DIAGNOSIS — K219 Gastro-esophageal reflux disease without esophagitis: Secondary | ICD-10-CM | POA: Diagnosis present

## 2023-12-10 DIAGNOSIS — Z781 Physical restraint status: Secondary | ICD-10-CM

## 2023-12-10 DIAGNOSIS — F10939 Alcohol use, unspecified with withdrawal, unspecified: Secondary | ICD-10-CM | POA: Diagnosis present

## 2023-12-10 NOTE — ED Triage Notes (Signed)
 PT states he feels like he is going through the DT's PT has history of alcohol  abuse.

## 2023-12-11 DIAGNOSIS — D751 Secondary polycythemia: Secondary | ICD-10-CM

## 2023-12-11 DIAGNOSIS — F10939 Alcohol use, unspecified with withdrawal, unspecified: Secondary | ICD-10-CM | POA: Diagnosis present

## 2023-12-11 DIAGNOSIS — F10931 Alcohol use, unspecified with withdrawal delirium: Secondary | ICD-10-CM | POA: Diagnosis not present

## 2023-12-11 DIAGNOSIS — Y908 Blood alcohol level of 240 mg/100 ml or more: Secondary | ICD-10-CM | POA: Diagnosis present

## 2023-12-11 DIAGNOSIS — E860 Dehydration: Secondary | ICD-10-CM | POA: Diagnosis present

## 2023-12-11 DIAGNOSIS — Z885 Allergy status to narcotic agent status: Secondary | ICD-10-CM | POA: Diagnosis not present

## 2023-12-11 DIAGNOSIS — Z79899 Other long term (current) drug therapy: Secondary | ICD-10-CM | POA: Diagnosis not present

## 2023-12-11 DIAGNOSIS — K219 Gastro-esophageal reflux disease without esophagitis: Secondary | ICD-10-CM | POA: Diagnosis present

## 2023-12-11 DIAGNOSIS — E878 Other disorders of electrolyte and fluid balance, not elsewhere classified: Secondary | ICD-10-CM | POA: Diagnosis not present

## 2023-12-11 DIAGNOSIS — D72829 Elevated white blood cell count, unspecified: Secondary | ICD-10-CM

## 2023-12-11 DIAGNOSIS — Z781 Physical restraint status: Secondary | ICD-10-CM | POA: Diagnosis not present

## 2023-12-11 DIAGNOSIS — F419 Anxiety disorder, unspecified: Secondary | ICD-10-CM | POA: Diagnosis present

## 2023-12-11 DIAGNOSIS — Z888 Allergy status to other drugs, medicaments and biological substances status: Secondary | ICD-10-CM | POA: Diagnosis not present

## 2023-12-11 DIAGNOSIS — G8929 Other chronic pain: Secondary | ICD-10-CM | POA: Diagnosis present

## 2023-12-11 DIAGNOSIS — F10231 Alcohol dependence with withdrawal delirium: Secondary | ICD-10-CM | POA: Diagnosis present

## 2023-12-11 DIAGNOSIS — F32A Depression, unspecified: Secondary | ICD-10-CM | POA: Diagnosis present

## 2023-12-11 LAB — COMPREHENSIVE METABOLIC PANEL WITH GFR
ALT: 31 U/L (ref 0–44)
AST: 43 U/L — ABNORMAL HIGH (ref 15–41)
Albumin: 4.4 g/dL (ref 3.5–5.0)
Alkaline Phosphatase: 48 U/L (ref 38–126)
Anion gap: 20 — ABNORMAL HIGH (ref 5–15)
BUN: 12 mg/dL (ref 6–20)
CO2: 22 mmol/L (ref 22–32)
Calcium: 9 mg/dL (ref 8.9–10.3)
Chloride: 96 mmol/L — ABNORMAL LOW (ref 98–111)
Creatinine, Ser: 1.01 mg/dL (ref 0.61–1.24)
GFR, Estimated: >60 mL/min (ref >60)
Glucose, Bld: 83 mg/dL (ref 70–99)
Potassium: 4.3 mmol/L (ref 3.5–5.1)
Sodium: 138 mmol/L (ref 135–145)
Total Bilirubin: 0.7 mg/dL (ref 0.0–1.2)
Total Protein: 8.1 g/dL (ref 6.5–8.1)

## 2023-12-11 LAB — CBC
HCT: 54.1 % — ABNORMAL HIGH (ref 39.0–52.0)
Hemoglobin: 18.1 g/dL — ABNORMAL HIGH (ref 13.0–17.0)
MCH: 30 pg (ref 26.0–34.0)
MCHC: 33.5 g/dL (ref 30.0–36.0)
MCV: 89.6 fL (ref 80.0–100.0)
Platelets: 364 K/uL (ref 150–400)
RBC: 6.04 MIL/uL — ABNORMAL HIGH (ref 4.22–5.81)
RDW: 12.9 % (ref 11.5–15.5)
WBC: 13.3 K/uL — ABNORMAL HIGH (ref 4.0–10.5)
nRBC: 0 % (ref 0.0–0.2)

## 2023-12-11 LAB — MRSA NEXT GEN BY PCR, NASAL: MRSA by PCR Next Gen: NOT DETECTED

## 2023-12-11 LAB — ETHANOL: Alcohol, Ethyl (B): 370 mg/dL (ref <15)

## 2023-12-11 LAB — PHOSPHORUS: Phosphorus: 3.2 mg/dL (ref 2.5–4.6)

## 2023-12-11 LAB — MAGNESIUM: Magnesium: 2.3 mg/dL (ref 1.7–2.4)

## 2023-12-11 MED ORDER — PHENOBARBITAL 32.4 MG PO TABS
97.2000 mg | ORAL_TABLET | Freq: Three times a day (TID) | ORAL | Status: AC
  Administered 2023-12-11 – 2023-12-13 (×5): 97.2 mg via ORAL
  Filled 2023-12-11 (×5): qty 3

## 2023-12-11 MED ORDER — LORAZEPAM 2 MG/ML IJ SOLN: 0.0000 mg | Freq: Two times a day (BID) | INTRAMUSCULAR | Status: DC

## 2023-12-11 MED ORDER — PHENOBARBITAL SODIUM 130 MG/ML IJ SOLN
260.0000 mg | Freq: Once | INTRAMUSCULAR | Status: AC
  Administered 2023-12-11: 260 mg via INTRAVENOUS
  Filled 2023-12-11: qty 4
  Filled 2023-12-11: qty 2

## 2023-12-11 MED ORDER — HEPARIN SODIUM (PORCINE) 5000 UNIT/ML IJ SOLN
5000.0000 [IU] | Freq: Three times a day (TID) | INTRAMUSCULAR | Status: DC
  Administered 2023-12-11 – 2023-12-20 (×27): 5000 [IU] via SUBCUTANEOUS
  Filled 2023-12-11 (×27): qty 1

## 2023-12-11 MED ORDER — DOCUSATE SODIUM 100 MG PO CAPS
100.0000 mg | ORAL_CAPSULE | Freq: Two times a day (BID) | ORAL | Status: DC | PRN
  Filled 2023-12-11: qty 1

## 2023-12-11 MED ORDER — HYDROXYZINE HCL 25 MG PO TABS
25.0000 mg | ORAL_TABLET | Freq: Once | ORAL | Status: AC
  Administered 2023-12-11: 25 mg via ORAL
  Filled 2023-12-11: qty 1

## 2023-12-11 MED ORDER — THIAMINE HCL 100 MG/ML IJ SOLN
100.0000 mg | Freq: Every day | INTRAMUSCULAR | Status: DC
  Administered 2023-12-14: 100 mg via INTRAVENOUS
  Filled 2023-12-11: qty 2

## 2023-12-11 MED ORDER — DEXMEDETOMIDINE HCL IN NACL 400 MCG/100ML IV SOLN
0.0000 ug/kg/h | INTRAVENOUS | Status: DC
  Administered 2023-12-11: 0.4 ug/kg/h via INTRAVENOUS
  Administered 2023-12-11 (×2): 0.7 ug/kg/h via INTRAVENOUS
  Administered 2023-12-12: 1.1 ug/kg/h via INTRAVENOUS
  Administered 2023-12-12: 0.7 ug/kg/h via INTRAVENOUS
  Administered 2023-12-12: 0.8 ug/kg/h via INTRAVENOUS
  Administered 2023-12-12: 1.2 ug/kg/h via INTRAVENOUS
  Administered 2023-12-13: 2 ug/kg/h via INTRAVENOUS
  Administered 2023-12-13: 1.2 ug/kg/h via INTRAVENOUS
  Administered 2023-12-13: 2 ug/kg/h via INTRAVENOUS
  Administered 2023-12-13 (×2): 1.2 ug/kg/h via INTRAVENOUS
  Administered 2023-12-13: 1.9 ug/kg/h via INTRAVENOUS
  Administered 2023-12-13: 2 ug/kg/h via INTRAVENOUS
  Administered 2023-12-13: 1.4 ug/kg/h via INTRAVENOUS
  Administered 2023-12-14: 1.6 ug/kg/h via INTRAVENOUS
  Administered 2023-12-14: 0.4 ug/kg/h via INTRAVENOUS
  Administered 2023-12-14 (×3): 2 ug/kg/h via INTRAVENOUS
  Administered 2023-12-15 (×3): 1.6 ug/kg/h via INTRAVENOUS
  Administered 2023-12-15: 1.7 ug/kg/h via INTRAVENOUS
  Administered 2023-12-15: 0.9 ug/kg/h via INTRAVENOUS
  Administered 2023-12-15 (×2): 1.2 ug/kg/h via INTRAVENOUS
  Administered 2023-12-16: 1.8 ug/kg/h via INTRAVENOUS
  Administered 2023-12-16: 1.5 ug/kg/h via INTRAVENOUS
  Administered 2023-12-16: 0.8 ug/kg/h via INTRAVENOUS
  Administered 2023-12-16: 1.1 ug/kg/h via INTRAVENOUS
  Administered 2023-12-16: 1.7 ug/kg/h via INTRAVENOUS
  Administered 2023-12-16: 1 ug/kg/h via INTRAVENOUS
  Administered 2023-12-17 (×2): 0.6 ug/kg/h via INTRAVENOUS
  Administered 2023-12-17: 1.8 ug/kg/h via INTRAVENOUS
  Administered 2023-12-17: 0.5 ug/kg/h via INTRAVENOUS
  Administered 2023-12-17: 1.9 ug/kg/h via INTRAVENOUS
  Administered 2023-12-18: 0.6 ug/kg/h via INTRAVENOUS
  Filled 2023-12-11 (×40): qty 100

## 2023-12-11 MED ORDER — FOLIC ACID 1 MG PO TABS
1.0000 mg | ORAL_TABLET | Freq: Every day | ORAL | Status: DC
  Administered 2023-12-11 – 2023-12-21 (×11): 1 mg via ORAL
  Filled 2023-12-11 (×11): qty 1

## 2023-12-11 MED ORDER — THIAMINE HCL 100 MG/ML IJ SOLN: 100.0000 mg | Freq: Every day | INTRAMUSCULAR | Status: DC

## 2023-12-11 MED ORDER — LORAZEPAM 1 MG PO TABS: 0.0000 mg | ORAL_TABLET | Freq: Two times a day (BID) | ORAL | Status: DC

## 2023-12-11 MED ORDER — CHLORHEXIDINE GLUCONATE CLOTH 2 % EX PADS
6.0000 | MEDICATED_PAD | Freq: Every day | CUTANEOUS | Status: DC
  Administered 2023-12-11 – 2023-12-21 (×11): 6 via TOPICAL

## 2023-12-11 MED ORDER — ORAL CARE MOUTH RINSE: 15.0000 mL | OROMUCOSAL | Status: DC | PRN

## 2023-12-11 MED ORDER — PROCHLORPERAZINE EDISYLATE 10 MG/2ML IJ SOLN
10.0000 mg | Freq: Once | INTRAMUSCULAR | Status: AC
  Administered 2023-12-11: 10 mg via INTRAVENOUS
  Filled 2023-12-11: qty 2

## 2023-12-11 MED ORDER — LORAZEPAM 1 MG PO TABS: 0.0000 mg | ORAL_TABLET | Freq: Four times a day (QID) | ORAL | Status: DC

## 2023-12-11 MED ORDER — PHENOBARBITAL 32.4 MG PO TABS: 64.8000 mg | ORAL_TABLET | Freq: Three times a day (TID) | ORAL | Status: DC

## 2023-12-11 MED ORDER — POLYETHYLENE GLYCOL 3350 17 G PO PACK: 17.0000 g | PACK | Freq: Every day | ORAL | Status: DC | PRN

## 2023-12-11 MED ORDER — LORAZEPAM 2 MG/ML IJ SOLN
2.0000 mg | Freq: Once | INTRAMUSCULAR | Status: AC
  Administered 2023-12-11: 2 mg via INTRAVENOUS
  Filled 2023-12-11: qty 1

## 2023-12-11 MED ORDER — LORAZEPAM 2 MG/ML IJ SOLN
0.0000 mg | Freq: Four times a day (QID) | INTRAMUSCULAR | Status: DC
  Administered 2023-12-11: 2 mg via INTRAVENOUS
  Filled 2023-12-11: qty 1

## 2023-12-11 MED ORDER — SODIUM CHLORIDE 0.9 % IV BOLUS
1000.0000 mL | Freq: Once | INTRAVENOUS | Status: AC
  Administered 2023-12-11: 1000 mL via INTRAVENOUS

## 2023-12-11 MED ORDER — THIAMINE MONONITRATE 100 MG PO TABS
100.0000 mg | ORAL_TABLET | Freq: Every day | ORAL | Status: DC
  Administered 2023-12-11: 100 mg via ORAL

## 2023-12-11 MED ORDER — LORAZEPAM 2 MG/ML IJ SOLN
2.0000 mg | INTRAMUSCULAR | Status: DC | PRN
  Administered 2023-12-11 – 2023-12-13 (×8): 2 mg via INTRAVENOUS
  Filled 2023-12-11 (×8): qty 1

## 2023-12-11 MED ORDER — PHENOBARBITAL 32.4 MG PO TABS: 32.4000 mg | ORAL_TABLET | Freq: Three times a day (TID) | ORAL | Status: DC

## 2023-12-11 MED ORDER — LORAZEPAM 2 MG/ML IJ SOLN: 1.0000 mg | Freq: Once | INTRAMUSCULAR | Status: DC

## 2023-12-11 MED ORDER — PHENOBARBITAL SODIUM 65 MG/ML IJ SOLN
130.0000 mg | Freq: Once | INTRAMUSCULAR | Status: AC
  Administered 2023-12-11: 130 mg via INTRAVENOUS
  Filled 2023-12-11: qty 2

## 2023-12-11 MED ORDER — ONDANSETRON HCL 4 MG/2ML IJ SOLN
4.0000 mg | Freq: Four times a day (QID) | INTRAMUSCULAR | Status: DC | PRN
  Administered 2023-12-14 – 2023-12-17 (×3): 4 mg via INTRAVENOUS
  Filled 2023-12-11 (×3): qty 2

## 2023-12-11 MED ORDER — ADULT MULTIVITAMIN W/MINERALS CH
1.0000 | ORAL_TABLET | Freq: Every day | ORAL | Status: DC
  Administered 2023-12-11 – 2023-12-21 (×11): 1 via ORAL
  Filled 2023-12-11 (×11): qty 1

## 2023-12-11 MED ORDER — ONDANSETRON HCL 4 MG PO TABS
4.0000 mg | ORAL_TABLET | Freq: Three times a day (TID) | ORAL | Status: DC | PRN
  Administered 2023-12-11: 4 mg via ORAL
  Filled 2023-12-11: qty 1

## 2023-12-11 MED ORDER — THIAMINE MONONITRATE 100 MG PO TABS
100.0000 mg | ORAL_TABLET | Freq: Every day | ORAL | Status: DC
  Administered 2023-12-11 – 2023-12-21 (×10): 100 mg via ORAL
  Filled 2023-12-11 (×10): qty 1

## 2023-12-11 NOTE — H&P (Signed)
 NAME:  Jermaine [name], MRN:  [id number], DOB:  [birth year], LOS: 0 ADMISSION DATE:  12/10/2023 CONSULTATION DATE:  12/11/2023 REFERRING MD:  Garrick - EDP, CHIEF COMPLAINT:  EtOH withdrawal   History of Present Illness:  58 year old man who presented to [facility] ED 7/16 with EtOH withdrawal, concern for DTs. PMHx significant for EtOH abuse (1 bottle of vodka daily) with history of withdrawal/DTs, anixety/depression, obesity. Recent admission 3/2 - 3/7 for EtOH withdrawal.  Patient presented to ED with complaint of feeling like he was going into DTs. Last drink ~5 hours PTA. On arrival, patient was afebrile with HR 118, RR 22, BP 137/93, SpO2 93% on RA. Labs were notable for WBC 13.3, Hgb 18.1 (suspect concentrated, baseline ~14), Plt 364. Na 138, K 4.3, CO2 22, BUN/Cr 12/1.01, phos 3.2, Mg 2.3, AST mildly elevated to 43, LFTs otherwise WNL. Ethanol 370.   PCCM consulted for ICU admission for delirium tremens and Precedex  initiation.  On admission, patient denies fever/chills, CP/SOB, n/v/d. No recent symptoms of illness. Endorses back pain. Denies edema.  Pertinent Medical History:   Past Medical History:  Diagnosis Date   Alcohol  abuse    Anxiety    Class 1 obesity 09/14/2021   Depression    Significant Hospital Events: Including procedures, antibiotic start and stop dates in addition to other pertinent events   7/16 - Presented to [facility] ED for EtOH withdrawal and concern for DTs. Phenobarbital , Precedex  initiated. PCCM consulted for ICU admission. . Interim History / Subjective:  PCCM consulted for ICU admission.  Objective:  Blood pressure (!) 123/92, pulse (!) 113, temperature 98.7 F (37.1 C), temperature source Oral, resp. rate (!) 26, weight 88 kg, SpO2 90%.        Intake/Output Summary (Last 24 hours) at 12/11/2023 1137 Last data filed at 12/11/2023 0949 Gross per 24 hour  Intake 2000 ml  Output --  Net 2000 ml   Filed Weights   12/10/23 2338  Weight: 88 kg   Physical  Examination: General: Acutely ill-appearing middle-aged man in NAD. HEENT: [place]/AT, anicteric sclera, PERRL, slightly dry mucous membranes. Neuro: Somnolent, wakes with persistent stimulation. Responds to verbal stimuli. Following commands consistently. Moves all 4 extremities spontaneously.  CV: Tachycardic to 120s, no m/g/r. PULM: Breathing even and unlabored on RA. Lung fields CTAB. GI: Soft, nontender, nondistended. Normoactive bowel sounds. Extremities: No LE edema noted. Skin: Warm/dry, no rashes.  Resolved Hospital Problem List:    Assessment & Plan:  EtOH withdrawal with delirium tremens Presented to [facility] ED with concern for alcohol  withdrawal, DTs. Ethanol level 370. - Admit to ICU - Monitor for signs/symptoms of withdrawal - Precedex , wean as able - Phenobarbital  taper, appreciate pharmacy assistance - CIWA protocol with Ativan  - Continue thiamine /folate - MV  Leukocytosis Erythrocytosis Presume 2/2 dehydration. - Trend CBC - Fluid resuscitation as tolerated  Anxiety Depression - Resume home Remeron   Chronic back pain - Hold home Zanaflex  GERD - PPI  Best Practice: (right click and Reselect all SmartList Selections daily)   Diet/type: Regular consistency (see orders) DVT prophylaxis: SCDs, SQH GI prophylaxis: PPI Lines: N/A Foley:  N/A Code Status:  full code Last date of multidisciplinary goals of care discussion [Pending]  Labs:  CBC: Recent Labs  Lab 12/11/23 0022  WBC 13.3*  HGB 18.1*  HCT 54.1*  MCV 89.6  PLT 364   Basic Metabolic Panel: Recent Labs  Lab 12/11/23 0022  NA 138  K 4.3  CL 96*  CO2 22  GLUCOSE 83  BUN 12  CREATININE 1.01  CALCIUM  9.0  MG 2.3  PHOS 3.2   GFR: Estimated Creatinine Clearance: 89.1 mL/min (by C-G formula based on SCr of 1.01 mg/dL). Recent Labs  Lab 12/11/23 0022  WBC 13.3*   Liver Function Tests: Recent Labs  Lab 12/11/23 0022  AST 43*  ALT 31  ALKPHOS 48  BILITOT 0.7  PROT 8.1   ALBUMIN 4.4   No results for input(s): LIPASE, AMYLASE in the last 168 hours. No results for input(s): AMMONIA in the last 168 hours.  ABG:    Component Value Date/Time   PHART 7.4 01/18/2023 0950   PCO2ART 38 01/18/2023 0950   PO2ART 96 01/18/2023 0950   HCO3 23.5 01/18/2023 0950   ACIDBASEDEF 1.1 01/18/2023 0950   O2SAT 99.5 01/18/2023 0950    Coagulation Profile: No results for input(s): INR, PROTIME in the last 168 hours.  Cardiac Enzymes: No results for input(s): CKTOTAL, CKMB, CKMBINDEX, TROPONINI in the last 168 hours.  HbA1C: Hgb A1c MFr Bld  Date/Time Value Ref Range Status  01/16/2023 02:57 AM 5.4 4.8 - 5.6 % Final    Comment:    (NOTE) Pre diabetes:          5.7%-6.4%  Diabetes:              >6.4%  Glycemic control for   <7.0% adults with diabetes   03/20/2019 03:46 AM 5.4 4.8 - 5.6 % Final    Comment:    (NOTE) Pre diabetes:          5.7%-6.4% Diabetes:              >6.4% Glycemic control for   <7.0% adults with diabetes    CBG: No results for input(s): GLUCAP in the last 168 hours.  Review of Systems:   Review of systems completed with pertinent positives/negatives outlined in above HPI.  Past Medical History:  He,  has a past medical history of Alcohol  abuse, Anxiety, Class 1 obesity (09/14/2021), and Depression.   Surgical History:   Past Surgical History:  Procedure Laterality Date   HERNIA REPAIR     Social History:   reports that he has never smoked. He has never been exposed to tobacco smoke. He has never used smokeless tobacco. He reports current alcohol  use of about 20.0 standard drinks of alcohol  per week. He reports that he does not currently use drugs.   Family History:  His family history is not on file. He was adopted.   Allergies: Allergies  Allergen Reactions   Ketamine      Hx of causing fever in 2024    Codeine Itching    Home Medications: Prior to Admission medications   Medication Sig Start  Date End Date Taking? Authorizing Provider  folic acid  (FOLVITE ) 1 MG tablet Take 1 tablet (1 mg total) by mouth daily. 08/02/23   Jillian Buttery, MD  hydrOXYzine  (ATARAX ) 25 MG tablet Take 2 tablets (50 mg total) by mouth every 8 (eight) hours as needed for anxiety. 08/02/23   Jillian Buttery, MD  mirtazapine  (REMERON ) 30 MG tablet Take 1 tablet (30 mg total) by mouth at bedtime. 08/02/23   Jillian Buttery, MD  pantoprazole  (PROTONIX ) 40 MG tablet Take 1 tablet (40 mg total) by mouth daily. 08/02/23 08/01/24  Jillian Buttery, MD  thiamine  (VITAMIN B-1) 100 MG tablet Take 1 tablet (100 mg total) by mouth daily. 08/02/23   Jillian Buttery, MD    Critical care time:   The patient is  critically ill with multiple organ system failure and requires high complexity decision making for assessment and support, frequent evaluation and titration of therapies, advanced monitoring, review of radiographic studies and interpretation of complex data.   Critical Care Time devoted to patient care services, exclusive of separately billable procedures, described in this note is 34 minutes.  Corean CHRISTELLA Kenyetta Wimbish, PA-C [facility] 12/11/23 11:37 AM  Please see [email or web address] for pager details.  From 7A-7P if no response, please call [phone] After hours, please call ELink [phone]

## 2023-12-11 NOTE — ED Provider Notes (Signed)
 Care of the patient assumed at signout.  On my initial exam the patient is resting comfortably, heart rate 115, resting.  Thus far he has received 2 doses of phenobarbital .  Update: Patient with heart rate now in the 130s, shaking.  No seizure, but with tremulousness, worsening heart rate in spite of multiple rounds of fluid resuscitation, phenobarbital , Ativan , patient will start Precedex  drip.  10:43 AM Patient now with vomiting, airway remains intact, he is speaking, but with concern for withdrawal as above, though the patient had improved previously, patient is on CIWA protocol, receiving Precedex  drip, ICU pending  CRITICAL CARE Performed by: Lamar Salen Total critical care time: 35 minutes Critical care time was exclusive of separately billable procedures and treating other patients. Critical care was necessary to treat or prevent imminent or life-threatening deterioration. Critical care was time spent personally by me on the following activities: development of treatment plan with patient and/or surrogate as well as nursing, discussions with consultants, evaluation of patient's response to treatment, examination of patient, obtaining history from patient or surrogate, ordering and performing treatments and interventions, ordering and review of laboratory studies, ordering and review of radiographic studies, pulse oximetry and re-evaluation of patient's condition.    Salen Lamar, MD 12/11/23 1043

## 2023-12-11 NOTE — ED Notes (Signed)
 ED TO INPATIENT HANDOFF REPORT  ED Nurse Name and Phone #: Alexia PEAK [phone]  S Name/Age/Gender Jermaine [name] 58 y.o. male Room/Bed: WA01/WA01  Code Status   Code Status: Full Code  Home/SNF/Other Home Patient oriented to: self, place, time, and situation Is this baseline? Yes   Triage Complete: Triage complete  Chief Complaint Alcohol  withdrawal [facility]) [F10.939]  Triage Note PT states he feels like he is going through the DT's PT has history of alcohol  abuse.   Allergies Allergies  Allergen Reactions   Ketamine      Hx of causing fever in 2024    Codeine Itching    Level of Care/Admitting Diagnosis ED Disposition     ED Disposition  Admit   Condition  --   Comment  Hospital Area: [facility] [[id number]]  Level of Care: ICU [6]  May admit patient to [name] or [facility] if equivalent level of care is available:: No  Covid Evaluation: Asymptomatic - no recent exposure (last 10 days) testing not required  Diagnosis: Alcohol  withdrawal (HCC) [291.81.ICD-9-CM]  Admitting Physician: SHELAH LAMAR RAMAN [[id number]]  Attending Physician: SHELAH LAMAR RAMAN [[id number]]  Certification:: I certify this patient will need inpatient services for at least 2 midnights  Expected Medical Readiness: 12/16/2023          B Medical/Surgery History Past Medical History:  Diagnosis Date   Alcohol  abuse    Anxiety    Class 1 obesity 09/14/2021   Depression    Past Surgical History:  Procedure Laterality Date   HERNIA REPAIR       A IV Location/Drains/Wounds Patient Lines/Drains/Airways Status     Active Line/Drains/Airways     Name Placement date Placement time Site Days   Peripheral IV 12/11/23 20 G Anterior;Distal;Right;Upper Arm 12/11/23  0527  Arm  less than 1            Intake/Output Last 24 hours  Intake/Output Summary (Last 24 hours) at 12/11/2023 1320 Last data filed at 12/11/2023 0949 Gross per 24 hour  Intake 2000 ml  Output --  Net  2000 ml    Labs/Imaging Results for orders placed or performed during the hospital encounter of 12/10/23 (from the past 48 hours)  Comprehensive metabolic panel     Status: Abnormal   Collection Time: 12/11/23 12:22 AM  Result Value Ref Range   Sodium 138 135 - 145 mmol/L   Potassium 4.3 3.5 - 5.1 mmol/L   Chloride 96 (L) 98 - 111 mmol/L   CO2 22 22 - 32 mmol/L   Glucose, Bld 83 70 - 99 mg/dL    Comment: Glucose reference range applies only to samples taken after fasting for at least 8 hours.   BUN 12 6 - 20 mg/dL   Creatinine, Ser 8.98 0.61 - 1.24 mg/dL   Calcium  9.0 8.9 - 10.3 mg/dL   Total Protein 8.1 6.5 - 8.1 g/dL   Albumin 4.4 3.5 - 5.0 g/dL   AST 43 (H) 15 - 41 U/L   ALT 31 0 - 44 U/L   Alkaline Phosphatase 48 38 - 126 U/L   Total Bilirubin 0.7 0.0 - 1.2 mg/dL   GFR, Estimated >39 >39 mL/min    Comment: (NOTE) Calculated using the CKD-EPI Creatinine Equation (2021)    Anion gap 20 (H) 5 - 15    Comment: Performed at [facility], [address] [place], [place] [address]  Magnesium      Status: None   Collection Time:  12/11/23 12:22 AM  Result Value Ref Range   Magnesium  2.3 1.7 - 2.4 mg/dL    Comment: Performed at [facility], [address], [place], [place] [address]  Phosphorus     Status: None   Collection Time: 12/11/23 12:22 AM  Result Value Ref Range   Phosphorus 3.2 2.5 - 4.6 mg/dL    Comment: Performed at [facility], [address] [place], [place] [address]  CBC     Status: Abnormal   Collection Time: 12/11/23 12:22 AM  Result Value Ref Range   WBC 13.3 (H) 4.0 - 10.5 K/uL   RBC 6.04 (H) 4.22 - 5.81 MIL/uL   Hemoglobin 18.1 (H) 13.0 - 17.0 g/dL   HCT 45.8 (H) 60.9 - 47.9 %   MCV 89.6 80.0 - 100.0 fL   MCH 30.0 26.0 - 34.0 pg   MCHC 33.5 30.0 - 36.0 g/dL   RDW 87.0 88.4 - 84.4 %   Platelets 364 150 - 400 K/uL   nRBC 0.0 0.0 - 0.2 %    Comment: Performed at [facility],  [address], [place], [place] [address]  Ethanol     Status: Abnormal   Collection Time: 12/11/23 12:23 AM  Result Value Ref Range   Alcohol , Ethyl (B) 370 (HH) <15 mg/dL    Comment: CRITICAL RESULT CALLED TO, READ BACK BY AND VERIFIED WITH black, m. rn at 0111 on 7.16.25. fa (NOTE) For medical purposes only. Performed at [facility], [address] [place], [place] [address]    No results found.  Pending Labs Unresulted Labs (From admission, onward)     Start     Ordered   12/12/23 0500  CBC  Tomorrow morning,   R        12/11/23 1141   12/12/23 0500  Basic metabolic panel  Tomorrow morning,   R        12/11/23 1141   12/12/23 0500  Magnesium   Tomorrow morning,   R        12/11/23 1141   12/12/23 0500  Phosphorus  Tomorrow morning,   R        12/11/23 1141   12/11/23 1140  MRSA Next Gen by PCR, Nasal  Once,   R        12/11/23 1141            Vitals/Pain Today's Vitals   12/11/23 0713 12/11/23 0713 12/11/23 0714 12/11/23 1115  BP:  (!) 137/93 (!) 137/93 (!) 123/92  Pulse:  (!) 118 (!) 118 (!) 113  Resp:  (!) 22  (!) 26  Temp:  98.7 F (37.1 C)    TempSrc:  Oral    SpO2:  93%  90%  Weight:      PainSc: Asleep Asleep      Isolation Precautions No active isolations  Medications Medications  thiamine  (VITAMIN B1) tablet 100 mg (100 mg Oral Given 12/11/23 0911)    Or  thiamine  (VITAMIN B1) injection 100 mg ( Intravenous See Alternative 12/11/23 0911)  folic acid  (FOLVITE ) tablet 1 mg (1 mg Oral Given 12/11/23 0911)  multivitamin with minerals tablet 1 tablet (1 tablet Oral Given 12/11/23 0911)  dexmedetomidine  (PRECEDEX ) 400 MCG/100ML (4 mcg/mL) infusion (0.4 mcg/kg/hr  88 kg Intravenous New Bag/Given 12/11/23 1017)  docusate sodium  (COLACE) capsule 100 mg (has no administration in time range)  polyethylene glycol (MIRALAX  / GLYCOLAX ) packet 17 g (has no administration in time range)  heparin  injection 5,000 Units (has no administration  in time range)  ondansetron  (ZOFRAN ) injection 4 mg (has no administration in time range)  LORazepam  (ATIVAN ) injection 2 mg (has no administration in time range)  PHENObarbital  (LUMINAL) injection 260 mg (260 mg Intravenous Given 12/11/23 0056)  sodium chloride  0.9 % bolus 1,000 mL (0 mLs Intravenous Stopped 12/11/23 0611)  hydrOXYzine  (ATARAX ) tablet 25 mg (25 mg Oral Given 12/11/23 0505)  prochlorperazine  (COMPAZINE ) injection 10 mg (10 mg Intravenous Given 12/11/23 0505)  PHENObarbital  (LUMINAL) injection 130 mg (130 mg Intravenous Given 12/11/23 0524)  LORazepam  (ATIVAN ) injection 2 mg (2 mg Intravenous Given 12/11/23 0611)  sodium chloride  0.9 % bolus 1,000 mL (0 mLs Intravenous Stopped 12/11/23 0949)    Mobility walks     Focused Assessments Neuro Assessment Handoff:  Swallow screen pass? N/A         Neuro Assessment:   Neuro Checks:      Has TPA been given? No If patient is a Neuro Trauma and patient is going to OR before floor call report to 4N Charge nurse: [phone] or [phone]   R Recommendations: See Admitting Provider Note  Report given to:   Additional Notes:

## 2023-12-11 NOTE — ED Notes (Signed)
 Pt O2 saturation at 85%. Pt plaved on 2 lpm nasal canula with improvement.

## 2023-12-11 NOTE — Progress Notes (Signed)
 TOC consulted for substance abuse resources. Resources provided and attached to AVS. No further TOC needs at this time. TOC signing off.

## 2023-12-11 NOTE — ED Notes (Signed)
 Lunch tray given patient ate 50% of tray

## 2023-12-11 NOTE — ED Provider Notes (Signed)
 [place] EMERGENCY DEPARTMENT AT [facility] Provider Note  CSN: [id number] Arrival date & time: 12/10/23 2317  Chief Complaint(s) Alcohol  Problem (PT states he feels like he is going through the DT's PT has history of alcohol  abuse.)  HPI Jermaine [name] is a 58 y.o. male with a past medical history listed below including alcohol  use disorder, anxiety, depression here with feelings that he is going through alcohol  withdrawal.  States that he normally drinks a bottle of vodka per day.  States his last drink was around 5 hours ago.  He states that he drank half a bottle at that time.  Feels like he is going into it.  Denies any hallucinations.no SI.  HPI  Past Medical History Past Medical History:  Diagnosis Date   Alcohol  abuse    Anxiety    Class 1 obesity 09/14/2021   Depression    Patient Active Problem List   Diagnosis Date Noted   Transaminitis 07/23/2023   Acute hypoxic respiratory failure (HCC) 06/26/2023   C. 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 Authorizing Provider  folic acid  (FOLVITE ) 1 MG tablet Take 1 tablet (1 mg total) by mouth daily. 08/02/23   Jillian Buttery, MD  hydrOXYzine  (ATARAX ) 25 MG tablet Take 2 tablets (50 mg total) by mouth every 8 (eight) hours as needed for anxiety. 08/02/23   Jillian Buttery, MD  mirtazapine  (REMERON ) 30 MG tablet Take 1 tablet (30 mg total) by mouth at bedtime. 08/02/23   Jillian Buttery,  MD  pantoprazole  (PROTONIX ) 40 MG tablet Take 1 tablet (40 mg total) by mouth daily. 08/02/23 08/01/24  Jillian Buttery, MD  thiamine  (VITAMIN B-1) 100 MG tablet Take 1 tablet (100 mg total) by mouth daily. 08/02/23   Jillian Buttery, MD                                                                                                                                    Allergies Ketamine  and Codeine  Review of Systems Review of Systems As noted in HPI  Physical Exam Vital Signs  I have reviewed the triage vital signs BP (!) 137/93   Pulse (!) 118   Temp 98.7 F (37.1 C) (Oral)   Resp (!) 22   Wt 88 kg   SpO2 93%   BMI 27.84 kg/m   Physical Exam Vitals reviewed.  Constitutional:      General: He is not in  acute distress.    Appearance: He is well-developed. He is not diaphoretic.  HENT:     Head: Normocephalic and atraumatic.     Nose: Nose normal.  Eyes:     General: No scleral icterus.       Right eye: No discharge.        Left eye: No discharge.     Conjunctiva/sclera: Conjunctivae normal.     Pupils: Pupils are equal, round, and reactive to light.  Cardiovascular:     Rate and Rhythm: Regular rhythm. Tachycardia present.     Heart sounds: No murmur heard.    No friction rub. No gallop.  Pulmonary:     Effort: Pulmonary effort is normal. No respiratory distress.     Breath sounds: Normal breath sounds. No stridor. No rales.  Abdominal:     General: There is no distension.     Palpations: Abdomen is soft.     Tenderness: There is no abdominal tenderness.  Musculoskeletal:        General: No tenderness.     Cervical back: Normal range of motion and neck supple.  Skin:    General: Skin is warm and dry.     Findings: No erythema or rash.  Neurological:     Mental Status: He is alert and oriented to person, place, and time.     Motor: Motor function is intact. No tremor.     ED Results and Treatments Labs (all labs ordered are listed, but only abnormal results are  displayed) Labs Reviewed  COMPREHENSIVE METABOLIC PANEL WITH GFR - Abnormal; Notable for the following components:      Result Value   Chloride 96 (*)    AST 43 (*)    Anion gap 20 (*)    All other components within normal limits  CBC - Abnormal; Notable for the following components:   WBC 13.3 (*)    RBC 6.04 (*)    Hemoglobin 18.1 (*)    HCT 54.1 (*)    All other components within normal limits  ETHANOL - Abnormal; Notable for the following components:   Alcohol , Ethyl (B) 370 (*)    All other components within normal limits  MAGNESIUM   PHOSPHORUS                                                                                                                         EKG  EKG Interpretation Date/Time:  Tuesday December 10 2023 23:56:17 EDT Ventricular Rate:  126 PR Interval:  145 QRS Duration:  107 QT Interval:  318 QTC Calculation: 461 R Axis:   115  Text Interpretation: Sinus tachycardia Multiple premature complexes, vent & supraven Aberrant conduction of SV complex(es) Right axis deviation Confirmed by Trine Likes [phone]) on 12/11/2023 5:51:11 AM       Radiology No results found.  Medications Ordered in ED Medications  thiamine  (VITAMIN B1) tablet 100 mg (has no administration in time range)    Or  thiamine  (  VITAMIN B1) injection 100 mg (has no administration in time range)  folic acid  (FOLVITE ) tablet 1 mg (has no administration in time range)  multivitamin with minerals tablet 1 tablet (has no administration in time range)  PHENObarbital  (LUMINAL) injection 260 mg (260 mg Intravenous Given 12/11/23 0056)  sodium chloride  0.9 % bolus 1,000 mL (0 mLs Intravenous Stopped 12/11/23 0611)  hydrOXYzine  (ATARAX ) tablet 25 mg (25 mg Oral Given 12/11/23 0505)  prochlorperazine  (COMPAZINE ) injection 10 mg (10 mg Intravenous Given 12/11/23 0505)  PHENObarbital  (LUMINAL) injection 130 mg (130 mg Intravenous Given 12/11/23 0524)  LORazepam  (ATIVAN ) injection 2 mg (2 mg Intravenous  Given 12/11/23 0611)   Procedures .Critical Care  Performed by: Trine Raynell Moder, MD Authorized by: Trine Raynell Moder, MD   Critical care provider statement:    Critical care time (minutes):  76   Critical care time was exclusive of:  Separately billable procedures and treating other patients   Critical care was necessary to treat or prevent imminent or life-threatening deterioration of the following conditions:  Toxidrome   Critical care was time spent personally by me on the following activities:  Development of treatment plan with patient or surrogate, discussions with consultants, evaluation of patient's response to treatment, examination of patient, obtaining history from patient or surrogate, review of old charts, re-evaluation of patient's condition, pulse oximetry, ordering and review of radiographic studies, ordering and review of laboratory studies and ordering and performing treatments and interventions   Care discussed with: admitting provider     (including critical care time) Medical Decision Making / ED Course   Medical Decision Making Amount and/or Complexity of Data Reviewed Labs: ordered. Decision-making details documented in ED Course. ECG/medicine tests: ordered and independent interpretation performed. Decision-making details documented in ED Course.  Risk OTC drugs. Prescription drug management.    Patient presents with anxiety and feeling like he is going through withdrawals. He is tachycardic with mildly elevated blood pressures.  No diaphoresis.  No tremors. Given timeframe and amount of alcohol  reportedly consumed, I feel the patient is still intoxicated and less likely going through withdrawals at this time.  This was confirmed with EtOH of 370. Patient was given phenobarbital  preemptively.  Labs are otherwise reassuring. EKG was sinus tachycardia without other dysrhythmias or blocks.  Patient becoming agitated stating that we need to fix his  anxiety.  Asking to be put down. He feels like he is going to be destructive. I had a frank conversation with the patient and asked him to be cooperative throughout his stay.  Since he is intoxicated, his not able to leave the premises safely and may require IVC if he chooses to leave or become aggressive.  Patient has remained cooperative throughout.  He did pull his IVs out but needed to be replaced.  Given additional IV fluids, Atarax  for anxiety and phenobarbital .  5:46 AM On reassessment, patient's tachycardia is trending up. Will give ativan  as he has likely metabolized.  Patient care turned over to oncoming provider. Patient case and results discussed in detail; please see their note for further ED managment.         Final Clinical Impression(s) / ED Diagnoses Final diagnoses:  None    This chart was dictated using voice recognition software.  Despite best efforts to proofread,  errors can occur which can change the documentation meaning.    Trine Raynell Moder, MD 12/11/23 [phone]

## 2023-12-11 NOTE — ED Notes (Signed)
 Breakfast tray given to patient, tolerated well

## 2023-12-12 DIAGNOSIS — F10931 Alcohol use, unspecified with withdrawal delirium: Secondary | ICD-10-CM | POA: Diagnosis not present

## 2023-12-12 DIAGNOSIS — K219 Gastro-esophageal reflux disease without esophagitis: Secondary | ICD-10-CM | POA: Diagnosis not present

## 2023-12-12 DIAGNOSIS — D72829 Elevated white blood cell count, unspecified: Secondary | ICD-10-CM | POA: Diagnosis not present

## 2023-12-12 LAB — CBC
HCT: 46.4 % (ref 39.0–52.0)
Hemoglobin: 15.2 g/dL (ref 13.0–17.0)
MCH: 30 pg (ref 26.0–34.0)
MCHC: 32.8 g/dL (ref 30.0–36.0)
MCV: 91.7 fL (ref 80.0–100.0)
Platelets: 225 K/uL (ref 150–400)
RBC: 5.06 MIL/uL (ref 4.22–5.81)
RDW: 12.9 % (ref 11.5–15.5)
WBC: 11 K/uL — ABNORMAL HIGH (ref 4.0–10.5)
nRBC: 0 % (ref 0.0–0.2)

## 2023-12-12 LAB — BASIC METABOLIC PANEL WITH GFR
Anion gap: 11 (ref 5–15)
BUN: 17 mg/dL (ref 6–20)
CO2: 28 mmol/L (ref 22–32)
Calcium: 8.6 mg/dL — ABNORMAL LOW (ref 8.9–10.3)
Chloride: 95 mmol/L — ABNORMAL LOW (ref 98–111)
Creatinine, Ser: 0.97 mg/dL (ref 0.61–1.24)
GFR, Estimated: >60 mL/min (ref >60)
Glucose, Bld: 108 mg/dL — ABNORMAL HIGH (ref 70–99)
Potassium: 3.9 mmol/L (ref 3.5–5.1)
Sodium: 134 mmol/L — ABNORMAL LOW (ref 135–145)

## 2023-12-12 LAB — MAGNESIUM: Magnesium: 2.1 mg/dL (ref 1.7–2.4)

## 2023-12-12 LAB — PHOSPHORUS: Phosphorus: 2.2 mg/dL — ABNORMAL LOW (ref 2.5–4.6)

## 2023-12-12 MED ORDER — POTASSIUM & SODIUM PHOSPHATES 280-160-250 MG PO PACK
2.0000 | PACK | ORAL | Status: AC
  Administered 2023-12-12 (×3): 2 via ORAL
  Filled 2023-12-12 (×3): qty 2

## 2023-12-12 MED ORDER — PHENOBARBITAL SODIUM 65 MG/ML IJ SOLN
65.0000 mg | Freq: Once | INTRAMUSCULAR | Status: AC
  Administered 2023-12-12: 65 mg via INTRAVENOUS
  Filled 2023-12-12: qty 1

## 2023-12-12 MED ORDER — PHENOBARBITAL SODIUM 130 MG/ML IJ SOLN
97.2000 mg | Freq: Three times a day (TID) | INTRAMUSCULAR | Status: AC
  Administered 2023-12-12 – 2023-12-13 (×2): 97.2 mg via INTRAVENOUS
  Filled 2023-12-12 (×2): qty 1

## 2023-12-12 MED ORDER — PANTOPRAZOLE SODIUM 40 MG IV SOLR
40.0000 mg | Freq: Every day | INTRAVENOUS | Status: DC
  Administered 2023-12-12 – 2023-12-14 (×3): 40 mg via INTRAVENOUS
  Filled 2023-12-12 (×3): qty 10

## 2023-12-12 NOTE — Progress Notes (Addendum)
 NAME:  Jermaine [name], MRN:  [id number], DOB:  March 21, 1966, LOS: 1 ADMISSION DATE:  12/10/2023 CONSULTATION DATE:  12/11/2023 REFERRING MD:  Garrick - EDP, CHIEF COMPLAINT:  EtOH withdrawal   History of Present Illness:  58 year old man who presented to [facility] ED 7/16 with EtOH withdrawal, concern for DTs. PMHx significant for EtOH abuse (1 bottle of vodka daily) with history of withdrawal/DTs, anixety/depression, obesity. Recent admission 3/2 - 3/7 for EtOH withdrawal.  Patient presented to ED with complaint of feeling like he was going into DTs. Last drink ~5 hours PTA. On arrival, patient was afebrile with HR 118, RR 22, BP 137/93, SpO2 93% on RA. Labs were notable for WBC 13.3, Hgb 18.1 (suspect concentrated, baseline ~14), Plt 364. Na 138, K 4.3, CO2 22, BUN/Cr 12/1.01, phos 3.2, Mg 2.3, AST mildly elevated to 43, LFTs otherwise WNL. Ethanol 370.   PCCM consulted for ICU admission for delirium tremens and Precedex  initiation.  On admission, patient denies fever/chills, CP/SOB, n/v/d. No recent symptoms of illness. Endorses back pain. Denies edema.  Pertinent Medical History:   Past Medical History:  Diagnosis Date   Alcohol  abuse    Anxiety    Class 1 obesity 09/14/2021   Depression    Significant Hospital Events: Including procedures, antibiotic start and stop dates in addition to other pertinent events   7/16 - Presented to [facility] ED for EtOH withdrawal and concern for DTs. Phenobarbital , Precedex  initiated. PCCM consulted for ICU admission. . Interim History / Subjective:  - He describes auditory and visual hallucinations - No seizure activity reported - 2 L/min nasal cannula - Precedex  at 0.7 - Phenobarbital  taper in place    Objective:  Blood pressure 124/76, pulse 66, temperature 98.6 F (37 C), temperature source Axillary, resp. rate (!) 24, height 5' 10 (1.778 m), weight 85.6 kg, SpO2 96%.        Intake/Output Summary (Last 24 hours) at 12/12/2023 0814 Last data filed  at 12/12/2023 9357 Gross per 24 hour  Intake 1897.74 ml  Output 650 ml  Net 1247.74 ml   Filed Weights   12/10/23 2338 12/12/23 0500  Weight: 88 kg 85.6 kg   Physical Examination: General: Calm, no distress HEENT: Oropharynx clear, somewhat dry, no secretions or stridor Neuro: Wakes easily to voice, answers questions, well-oriented.  Moves all extremities.  Follows commands.  Does describe hallucinations CV: Regular, heart rate improved 60s PULM: Clear bilaterally GI: Nondistended, positive bowel sounds Extremities: No edema Skin: No rash  Resolved Hospital Problem List:    Assessment & Plan:  EtOH withdrawal with delirium tremens Presented to [facility] ED with concern for alcohol  withdrawal, DTs. Ethanol level 370. - Continue phenobarbital  taper - Continue to wean Precedex  and as able as his delirium improves - IV lorazepam  ordered as needed.  Will transition to dedicated CIWA protocol Ativan  dosing once Precedex  is off - Continue thiamine , folate, multivitamin  Leukocytosis Erythrocytosis Presume 2/2 dehydration. - Followed CBC trend - Fluid resuscitation  Anxiety Depression -Patient has not been taking his home.  Will discuss whether to restart when the patient is oriented and can have that discussion  Chronic back pain - Continue to hold home Zanaflex  GERD - order PPI  Hypophosphatemia - Replacing 7/17 - follow BMP  Best Practice: (right click and Reselect all SmartList Selections daily)   Diet/type: Regular consistency (see orders) DVT prophylaxis: SCDs, SQH GI prophylaxis: PPI Lines: N/A Foley:  N/A Code Status:  full code Last date of multidisciplinary goals of care  discussion [Pending]  Labs:  CBC: Recent Labs  Lab 12/11/23 0022 12/12/23 0257  WBC 13.3* 11.0*  HGB 18.1* 15.2  HCT 54.1* 46.4  MCV 89.6 91.7  PLT 364 225   Basic Metabolic Panel: Recent Labs  Lab 12/11/23 0022 12/12/23 0257  NA 138 134*  K 4.3 3.9  CL 96* 95*  CO2 22 28   GLUCOSE 83 108*  BUN 12 17  CREATININE 1.01 0.97  CALCIUM  9.0 8.6*  MG 2.3 2.1  PHOS 3.2 2.2*   GFR: Estimated Creatinine Clearance: 85.7 mL/min (by C-G formula based on SCr of 0.97 mg/dL). Recent Labs  Lab 12/11/23 0022 12/12/23 0257  WBC 13.3* 11.0*   Liver Function Tests: Recent Labs  Lab 12/11/23 0022  AST 43*  ALT 31  ALKPHOS 48  BILITOT 0.7  PROT 8.1  ALBUMIN 4.4   No results for input(s): LIPASE, AMYLASE in the last 168 hours. No results for input(s): AMMONIA in the last 168 hours.  ABG:    Component Value Date/Time   PHART 7.4 01/18/2023 0950   PCO2ART 38 01/18/2023 0950   PO2ART 96 01/18/2023 0950   HCO3 23.5 01/18/2023 0950   ACIDBASEDEF 1.1 01/18/2023 0950   O2SAT 99.5 01/18/2023 0950    Coagulation Profile: No results for input(s): INR, PROTIME in the last 168 hours.  Cardiac Enzymes: No results for input(s): CKTOTAL, CKMB, CKMBINDEX, TROPONINI in the last 168 hours.  HbA1C: Hgb A1c MFr Bld  Date/Time Value Ref Range Status  01/16/2023 02:57 AM 5.4 4.8 - 5.6 % Final    Comment:    (NOTE) Pre diabetes:          5.7%-6.4%  Diabetes:              >6.4%  Glycemic control for   <7.0% adults with diabetes   03/20/2019 03:46 AM 5.4 4.8 - 5.6 % Final    Comment:    (NOTE) Pre diabetes:          5.7%-6.4% Diabetes:              >6.4% Glycemic control for   <7.0% adults with diabetes    CBG: No results for input(s): GLUCAP in the last 168 hours.   Critical care time: 32 min   The patient is critically ill with multiple organ system failure and requires high complexity decision making for assessment and support, frequent evaluation and titration of therapies, advanced monitoring, review of radiographic studies and interpretation of complex data.   Lamar Chris, MD, PhD 12/12/2023, 8:14 AM [facility] [phone] or if no answer before 7:00PM call [phone] For any issues after 7:00PM  please call eLink [phone]

## 2023-12-12 NOTE — Progress Notes (Signed)
 eLink Physician-Brief Progress Note Patient Name: Jermaine [name] DOB: 1966-05-22 MRN: [id number]   Date of Service  12/12/2023  HPI/Events of Note  58 year old man with a history of alcohol  abuse and continued alcohol  use, anxiety and depression. He has had multiple admissions for alcohol  withdrawal and DT.  Comes in with alcohol  withdrawal  Patient has become more agitated.  Currently on a phenobarbital  taper.  Ongoing Precedex  at 0.7.  Has small diaphoresis and hallucinations.  Psychedelic visions   eICU Interventions  Additional dose of phenobarbital  65 x 1   Current care     Intervention Category Minor Interventions: Agitation / anxiety - evaluation and management  [name] 12/12/2023, 3:00 AM

## 2023-12-12 NOTE — Progress Notes (Signed)
 Patient frequently startles and trying to get out of bed. When patient wakes, he is seeing unseen other things and people, very anxious, and verbalizing fear. Unable to administer more doses of Ativan  at this time and patient maxed on Precedex  dose. In past admissions, when safely allowed to ambulate, patient more cooperative. Therefore, utilizing 3-4 staff members, gait belt, walker, and having wheelchair directly behind patient, we assisted patient in mobility. Patient followed simple commands, had slow and barely shuffling gait, and only made it from the bed to just outside the room door. Patient assisted to wheelchair where he promptly fell asleep. Just prior to getting patient back to bed, brushed his hair and contacted E-LINK RN to request additional prn doses of Ativan  for withdrawal symptoms. Patient was assisted back to bed and able to verbalize appreciation to staff. Bed alarm re-engaged and awaiting response from [facility] MD for any further prn doses to manage restlessness and withdrawal symptoms.

## 2023-12-12 NOTE — Progress Notes (Addendum)
 eLink Physician-Brief Progress Note Patient Name: Jermaine [name] DOB: [birth year] MRN: [id number]   Date of Service  12/12/2023  HPI/Events of Note  58 year old man with a history of alcohol  abuse and continued alcohol  use, anxiety and depression. He has had multiple admissions for alcohol  withdrawal and DT.  Comes in with alcohol  withdrawal   More agitated now despite maximal Precedex , received Ativan  for anxiety at 1932.  On camera evaluation, patient is resting comfortably.  eICU Interventions  Delirium is waxing/waning.  Additional dose of phenobarb x 1.  No other changes to care at this time   2135 -patient appears to be extremely agitated and delirious.  Just received additional dose of phenobarbital .  Will initiate restraints for patient's safety so he does not pull out his IVs/fall.  The next 2 doses of phenobarbital , make IV phenobarbital  available instead of p.o.  Intervention Category Minor Interventions: Agitation / anxiety - evaluation and management  Kingson Lohmeyer 12/12/2023, 8:51 PM

## 2023-12-12 NOTE — Progress Notes (Signed)
 [facility] ADULT ICU REPLACEMENT PROTOCOL   The patient does apply for the [facility] Adult ICU Electrolyte Replacment Protocol based on the criteria listed below:   1.Exclusion criteria: TCTS, ECMO, Dialysis, and Myasthenia Gravis patients 2. Is GFR >/= 30 ml/min? Yes.    Patient's GFR today is >60 3. Is SCr </= 2? Yes.   Patient's SCr is 0.97 mg/dL 4. Did SCr increase >/= 0.5 in 24 hours? No. 5.Pt's weight >40kg  Yes.   6. Abnormal electrolyte(s):   Phos 2.2  7. Electrolytes replaced per protocol 8.  Call MD STAT for K+ </= 2.5, Phos </= 1, or Mag </= 1 Physician:  A. Haze Harlene SAUNDERS Vivi Piccirilli 12/12/2023 5:43 AM

## 2023-12-13 DIAGNOSIS — K219 Gastro-esophageal reflux disease without esophagitis: Secondary | ICD-10-CM | POA: Diagnosis not present

## 2023-12-13 DIAGNOSIS — D72829 Elevated white blood cell count, unspecified: Secondary | ICD-10-CM | POA: Diagnosis not present

## 2023-12-13 DIAGNOSIS — E878 Other disorders of electrolyte and fluid balance, not elsewhere classified: Secondary | ICD-10-CM | POA: Diagnosis not present

## 2023-12-13 DIAGNOSIS — F10931 Alcohol use, unspecified with withdrawal delirium: Secondary | ICD-10-CM | POA: Diagnosis not present

## 2023-12-13 LAB — BASIC METABOLIC PANEL WITH GFR
Anion gap: 12 (ref 5–15)
BUN: 11 mg/dL (ref 6–20)
CO2: 27 mmol/L (ref 22–32)
Calcium: 8.7 mg/dL — ABNORMAL LOW (ref 8.9–10.3)
Chloride: 95 mmol/L — ABNORMAL LOW (ref 98–111)
Creatinine, Ser: 0.85 mg/dL (ref 0.61–1.24)
GFR, Estimated: >60 mL/min (ref >60)
Glucose, Bld: 104 mg/dL — ABNORMAL HIGH (ref 70–99)
Potassium: 4 mmol/L (ref 3.5–5.1)
Sodium: 134 mmol/L — ABNORMAL LOW (ref 135–145)

## 2023-12-13 LAB — MAGNESIUM: Magnesium: 2.4 mg/dL (ref 1.7–2.4)

## 2023-12-13 LAB — PHOSPHORUS: Phosphorus: 2.6 mg/dL (ref 2.5–4.6)

## 2023-12-13 MED ORDER — PHENOBARBITAL SODIUM 65 MG/ML IJ SOLN
32.5000 mg | Freq: Three times a day (TID) | INTRAMUSCULAR | Status: AC
  Administered 2023-12-15 – 2023-12-17 (×6): 32.5 mg via INTRAVENOUS
  Filled 2023-12-13 (×6): qty 1

## 2023-12-13 MED ORDER — DIAZEPAM 5 MG PO TABS
5.0000 mg | ORAL_TABLET | Freq: Two times a day (BID) | ORAL | Status: DC
  Administered 2023-12-13 – 2023-12-14 (×4): 5 mg via ORAL
  Filled 2023-12-13 (×4): qty 1

## 2023-12-13 MED ORDER — PHENOBARBITAL SODIUM 65 MG/ML IJ SOLN
65.0000 mg | Freq: Three times a day (TID) | INTRAMUSCULAR | Status: AC
  Administered 2023-12-13 – 2023-12-15 (×6): 65 mg via INTRAVENOUS
  Filled 2023-12-13 (×6): qty 1

## 2023-12-13 MED ORDER — MIDAZOLAM HCL 2 MG/2ML IJ SOLN
INTRAMUSCULAR | Status: AC
  Filled 2023-12-13: qty 2

## 2023-12-13 MED ORDER — MIDAZOLAM HCL 2 MG/2ML IJ SOLN
2.0000 mg | INTRAMUSCULAR | Status: DC | PRN
  Administered 2023-12-13 – 2023-12-15 (×13): 2 mg via INTRAVENOUS
  Filled 2023-12-13 (×14): qty 2

## 2023-12-13 MED ORDER — PHENOBARBITAL SODIUM 130 MG/ML IJ SOLN
130.0000 mg | Freq: Once | INTRAMUSCULAR | Status: AC
  Administered 2023-12-13: 130 mg via INTRAVENOUS
  Filled 2023-12-13: qty 1

## 2023-12-13 NOTE — TOC Initial Note (Signed)
 Transition of Care [facility]) - Initial/Assessment Note    Patient Details  Name: Jermaine [name] MRN: [id number] Date of Birth: 1965/07/04  Transition of Care [facility]) CM/SW Contact:    Jon ONEIDA Anon, RN Phone Number: 12/13/2023, 12:53 PM  Clinical Narrative:                 Pt is from home. Pt oriented to self only at this time. Appears agitated. Pt requiring restraints for safety. Care Management consulted for substances abuse resources, resources added to AVS. There are no SDOH risks identified. No DME or HH needs at this time. IP Care Management will follow for any new recommendations or DC needs.    Expected Discharge Plan: Home/Self Care Barriers to Discharge: Continued Medical Work up, Requiring sitter/restraints   Patient Goals and CMS Choice Patient states their goals for this hospitalization and ongoing recovery are:: To return home CMS Medicare.gov Compare Post Acute Care list provided to:: Other (Comment Required) (NA) Choice offered to / list presented to : NA [place] ownership interest in [facility].provided to:: Parent NA    Expected Discharge Plan and Services In-house Referral: Clinical Social Work Discharge Planning Services: CM Consult Post Acute Care Choice: NA Living arrangements for the past 2 months: Single Family Home                 DME Arranged: N/A DME Agency: NA       HH Arranged: NA HH Agency: NA        Prior Living Arrangements/Services Living arrangements for the past 2 months: Single Family Home Lives with:: Self Patient language and need for interpreter reviewed:: Yes Do you feel safe going back to the place where you live?: Yes      Need for Family Participation in Patient Care: No (Comment) Care giver support system in place?: No (comment) Current home services: Other (comment) (NA) Criminal Activity/Legal Involvement Pertinent to Current Situation/Hospitalization: No - Comment as needed  Activities of Daily Living    ADL Screening (condition at time of admission) Independently performs ADLs?: Yes (appropriate for developmental age) Is the patient deaf or have difficulty hearing?: No Does the patient have difficulty seeing, even when wearing glasses/contacts?: No Does the patient have difficulty concentrating, remembering, or making decisions?: No  Permission Sought/Granted Permission sought to share information with : Family Supports Permission granted to share information with : Yes, Verbal Permission Granted  Share Information with NAME: Roxy Shuck (Sister)  [phone]           Emotional Assessment Appearance:: Appears stated age Attitude/Demeanor/Rapport: Screaming Affect (typically observed): Restless Orientation: : Oriented to Self Alcohol  / Substance Use: Alcohol  Use    Admission diagnosis:  Alcohol  withdrawal (HCC) [F10.939] Alcohol  withdrawal syndrome, with delirium (HCC) [F10.931] Patient Active Problem List   Diagnosis Date Noted   Transaminitis 07/23/2023   Acute hypoxic respiratory failure (HCC) 06/26/2023   C. 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   Orbital floor (blow-out) closed fracture (HCC) 05/03/2023   Alcohol  withdrawal (HCC) 05/02/2023   Facial fracture due to fall (HCC) 05/02/2023   DTs (delirium tremens) (HCC) 04/22/2022   Class 1 obesity 09/14/2021   Secondary polycythemia 09/14/2021   Sinus tachycardia 07/01/2021   Anxiety and depression 11/20/2018   Hepatic steatosis 11/19/2018   Alcohol  abuse with intoxication (HCC) 11/16/2018   PCP:  Patient, No Pcp Per Pharmacy:   [id number] [address] [place], [place] - [address] [place]  [address] Phone: [phone] Fax: [phone]  DARRYLE LONG - [facility] [address] [place] [address] Phone: [phone] Fax: [phone]     Social Drivers of Health (SDOH) Social History: SDOH Screenings   Food  Insecurity: Patient Declined (07/28/2023)  Housing: Low Risk  (12/11/2023)  Transportation Needs: No Transportation Needs (12/11/2023)  Utilities: Not At Risk (12/11/2023)  Tobacco Use: Low Risk  (12/10/2023)   SDOH Interventions:     Readmission Risk Interventions    12/13/2023   12:29 PM 07/29/2023    3:07 PM 05/13/2023    2:46 PM  Readmission Risk Prevention Plan  Transportation Screening Complete Complete Complete  PCP or Specialist Appt within 3-5 Days   Complete  HRI or Home Care Consult   Complete  Social Work Consult for Recovery Care Planning/Counseling   Complete  Palliative Care Screening   Not Applicable  Medication Review Oceanographer) Complete Complete Complete  PCP or Specialist appointment within 3-5 days of discharge Complete    HRI or Home Care Consult Complete Complete   SW Recovery Care/Counseling Consult Complete Complete   Palliative Care Screening Not Applicable Not Applicable   Skilled Nursing Facility Not Applicable Not Applicable

## 2023-12-13 NOTE — Plan of Care (Signed)
  Problem: Skin Integrity: Goal: Risk for impaired skin integrity will decrease Outcome: Progressing   Problem: Education: Goal: Knowledge of General Education information will improve Description: Including pain rating scale, medication(s)/side effects and non-pharmacologic comfort measures Outcome: Not Progressing   Problem: Health Behavior/Discharge Planning: Goal: Ability to manage health-related needs will improve Outcome: Not Progressing   Problem: Clinical Measurements: Goal: Ability to maintain clinical measurements within normal limits will improve Outcome: Not Progressing Goal: Cardiovascular complication will be avoided Outcome: Not Progressing   Problem: Activity: Goal: Risk for activity intolerance will decrease Outcome: Not Progressing   Problem: Nutrition: Goal: Adequate nutrition will be maintained Outcome: Not Progressing   Problem: Coping: Goal: Level of anxiety will decrease Outcome: Not Progressing   Problem: Elimination: Goal: Will not experience complications related to bowel motility Outcome: Not Progressing Goal: Will not experience complications related to urinary retention Outcome: Not Progressing   Problem: Pain Managment: Goal: General experience of comfort will improve and/or be controlled Outcome: Not Progressing   Problem: Safety: Goal: Ability to remain free from injury will improve Outcome: Not Progressing   Problem: Safety: Goal: Non-violent Restraint(s) Outcome: Not Progressing

## 2023-12-13 NOTE — Progress Notes (Signed)
 NAME:  Jermaine [name], MRN:  [id number], DOB:  Oct 31, 1965, LOS: 2 ADMISSION DATE:  12/10/2023 CONSULTATION DATE:  12/11/2023 REFERRING MD:  Garrick - EDP, CHIEF COMPLAINT:  EtOH withdrawal   History of Present Illness:  58 year old man who presented to [facility] ED 7/16 with EtOH withdrawal, concern for DTs. PMHx significant for EtOH abuse (1 bottle of vodka daily) with history of withdrawal/DTs, anixety/depression, obesity. Recent admission 3/2 - 3/7 for EtOH withdrawal.  Patient presented to ED with complaint of feeling like he was going into DTs. Last drink ~5 hours PTA. On arrival, patient was afebrile with HR 118, RR 22, BP 137/93, SpO2 93% on RA. Labs were notable for WBC 13.3, Hgb 18.1 (suspect concentrated, baseline ~14), Plt 364. Na 138, K 4.3, CO2 22, BUN/Cr 12/1.01, phos 3.2, Mg 2.3, AST mildly elevated to 43, LFTs otherwise WNL. Ethanol 370.   PCCM consulted for ICU admission for delirium tremens and Precedex  initiation.  On admission, patient denies fever/chills, CP/SOB, n/v/d. No recent symptoms of illness. Endorses back pain. Denies edema.  Pertinent Medical History:   Past Medical History:  Diagnosis Date   Alcohol  abuse    Anxiety    Class 1 obesity 09/14/2021   Depression    Significant Hospital Events: Including procedures, antibiotic start and stop dates in addition to other pertinent events   7/16 - Presented to [facility] ED for EtOH withdrawal and concern for DTs. Phenobarbital , Precedex  initiated. PCCM consulted for ICU admission. 7/18 on Precedex , did require bolus of phenobarb . Interim History / Subjective:  No seizures, on Precedex , required bolus phenobarb In restraints for his safety  Objective:  Blood pressure (!) 170/105, pulse 68, temperature 98.4 F (36.9 C), temperature source Axillary, resp. rate (!) 23, height 5' 10 (1.778 m), weight 84.7 kg, SpO2 94%.        Intake/Output Summary (Last 24 hours) at 12/13/2023 0829 Last data filed at 12/13/2023  9383 Gross per 24 hour  Intake 883.92 ml  Output 1900 ml  Net -1016.08 ml   Filed Weights   12/10/23 2338 12/12/23 0500 12/13/23 0500  Weight: 88 kg 85.6 kg 84.7 kg   Physical Examination: General: AX, present moist oral mucosa HEENT: Oropharynx clear, somewhat dry, no secretions or stridor Neuro: Arouses to voice, not following commands, moving all extremities CV: S1-S2 appreciated PULM: Clear breath sounds GI: Bowel sounds appreciated Extremities: No edema, no clubbing Skin: No rash  Resolved Hospital Problem List:    Assessment & Plan:   EtOH withdrawal with delirium tremens - On phenobarb taper - On Precedex  - CIWA in place -Continue thiamine , folate, multivitamin  Anxiety/depression - Was apparently off his home medications prior to coming in -Counsel regarding compliance  History of chronic back pain - Home Zanaflex on hold - Will need to reinitiate some medications for his chronic pain at some point  History of GERD - On PPI  Electrolyte derangement - Continue to monitor and replete  Leukocytosis - Continue to monitor - No sign of ongoing infection at present  Risk of decompensation remains high as he still requires significant intravenous sedation  Best Practice: (right click and Reselect all SmartList Selections daily)   Diet/type: Regular consistency (see orders) DVT prophylaxis: SCDs, SQH GI prophylaxis: PPI Lines: N/A Foley:  N/A Code Status:  full code Last date of multidisciplinary goals of care discussion [Pending]  Labs:  CBC: Recent Labs  Lab 12/11/23 0022 12/12/23 0257  WBC 13.3* 11.0*  HGB 18.1* 15.2  HCT 54.1* 46.4  MCV 89.6 91.7  PLT 364 225   Basic Metabolic Panel: Recent Labs  Lab 12/11/23 0022 12/12/23 0257 12/13/23 0305  NA 138 134* 134*  K 4.3 3.9 4.0  CL 96* 95* 95*  CO2 22 28 27   GLUCOSE 83 108* 104*  BUN 12 17 11   CREATININE 1.01 0.97 0.85  CALCIUM  9.0 8.6* 8.7*  MG 2.3 2.1 2.4  PHOS 3.2 2.2* 2.6    GFR: Estimated Creatinine Clearance: 97.8 mL/min (by C-G formula based on SCr of 0.85 mg/dL). Recent Labs  Lab 12/11/23 0022 12/12/23 0257  WBC 13.3* 11.0*   Liver Function Tests: Recent Labs  Lab 12/11/23 0022  AST 43*  ALT 31  ALKPHOS 48  BILITOT 0.7  PROT 8.1  ALBUMIN 4.4   No results for input(s): LIPASE, AMYLASE in the last 168 hours. No results for input(s): AMMONIA in the last 168 hours.  ABG:    Component Value Date/Time   PHART 7.4 01/18/2023 0950   PCO2ART 38 01/18/2023 0950   PO2ART 96 01/18/2023 0950   HCO3 23.5 01/18/2023 0950   ACIDBASEDEF 1.1 01/18/2023 0950   O2SAT 99.5 01/18/2023 0950    Coagulation Profile: No results for input(s): INR, PROTIME in the last 168 hours.  Cardiac Enzymes: No results for input(s): CKTOTAL, CKMB, CKMBINDEX, TROPONINI in the last 168 hours.  HbA1C: Hgb A1c MFr Bld  Date/Time Value Ref Range Status  01/16/2023 02:57 AM 5.4 4.8 - 5.6 % Final    Comment:    (NOTE) Pre diabetes:          5.7%-6.4%  Diabetes:              >6.4%  Glycemic control for   <7.0% adults with diabetes   03/20/2019 03:46 AM 5.4 4.8 - 5.6 % Final    Comment:    (NOTE) Pre diabetes:          5.7%-6.4% Diabetes:              >6.4% Glycemic control for   <7.0% adults with diabetes    CBG: No results for input(s): GLUCAP in the last 168 hours.  The patient is critically ill with multiple organ systems failure and requires high complexity decision making for assessment and support, frequent evaluation and titration of therapies, application of advanced monitoring technologies and extensive interpretation of multiple databases. Critical Care Time devoted to patient care services described in this note independent of APP/resident time (if applicable)  is 33 minutes.   Jennet Epley MD [place] Personal pager: See Amion If unanswered, please page CCM On-call: #[phone]

## 2023-12-13 NOTE — Plan of Care (Signed)

## 2023-12-14 DIAGNOSIS — E878 Other disorders of electrolyte and fluid balance, not elsewhere classified: Secondary | ICD-10-CM | POA: Diagnosis not present

## 2023-12-14 DIAGNOSIS — D72829 Elevated white blood cell count, unspecified: Secondary | ICD-10-CM | POA: Diagnosis not present

## 2023-12-14 DIAGNOSIS — F10931 Alcohol use, unspecified with withdrawal delirium: Secondary | ICD-10-CM | POA: Diagnosis not present

## 2023-12-14 DIAGNOSIS — K219 Gastro-esophageal reflux disease without esophagitis: Secondary | ICD-10-CM | POA: Diagnosis not present

## 2023-12-14 LAB — BASIC METABOLIC PANEL WITH GFR
Anion gap: 13 (ref 5–15)
BUN: 13 mg/dL (ref 6–20)
CO2: 24 mmol/L (ref 22–32)
Calcium: 8.7 mg/dL — ABNORMAL LOW (ref 8.9–10.3)
Chloride: 96 mmol/L — ABNORMAL LOW (ref 98–111)
Creatinine, Ser: 1.07 mg/dL (ref 0.61–1.24)
GFR, Estimated: >60 mL/min (ref >60)
Glucose, Bld: 95 mg/dL (ref 70–99)
Potassium: 4.4 mmol/L (ref 3.5–5.1)
Sodium: 133 mmol/L — ABNORMAL LOW (ref 135–145)

## 2023-12-14 LAB — CBC
HCT: 53 % — ABNORMAL HIGH (ref 39.0–52.0)
Hemoglobin: 16.9 g/dL (ref 13.0–17.0)
MCH: 29.2 pg (ref 26.0–34.0)
MCHC: 31.9 g/dL (ref 30.0–36.0)
MCV: 91.7 fL (ref 80.0–100.0)
Platelets: 213 K/uL (ref 150–400)
RBC: 5.78 MIL/uL (ref 4.22–5.81)
RDW: 12.4 % (ref 11.5–15.5)
WBC: 9.6 K/uL (ref 4.0–10.5)
nRBC: 0 % (ref 0.0–0.2)

## 2023-12-14 LAB — MAGNESIUM: Magnesium: 2.1 mg/dL (ref 1.7–2.4)

## 2023-12-14 LAB — PHOSPHORUS: Phosphorus: 2.9 mg/dL (ref 2.5–4.6)

## 2023-12-14 MED ORDER — LOPERAMIDE HCL 2 MG PO CAPS
2.0000 mg | ORAL_CAPSULE | Freq: Once | ORAL | Status: AC | PRN
  Administered 2023-12-14: 2 mg via ORAL
  Filled 2023-12-14: qty 1

## 2023-12-14 MED ORDER — PHENOBARBITAL SODIUM 130 MG/ML IJ SOLN
130.0000 mg | Freq: Once | INTRAMUSCULAR | Status: AC
  Administered 2023-12-14: 130 mg via INTRAVENOUS
  Filled 2023-12-14: qty 1

## 2023-12-14 NOTE — Progress Notes (Signed)
 NAME:  Jermaine [name], MRN:  [id number], DOB:  1965-09-30, LOS: 3 ADMISSION DATE:  12/10/2023 CONSULTATION DATE:  12/11/2023 REFERRING MD:  Garrick - EDP, CHIEF COMPLAINT:  EtOH withdrawal   History of Present Illness:  58 year old man who presented to [facility] ED 7/16 with EtOH withdrawal, concern for DTs. PMHx significant for EtOH abuse (1 bottle of vodka daily) with history of withdrawal/DTs, anixety/depression, obesity. Recent admission 3/2 - 3/7 for EtOH withdrawal.  Patient presented to ED with complaint of feeling like he was going into DTs. Last drink ~5 hours PTA. On arrival, patient was afebrile with HR 118, RR 22, BP 137/93, SpO2 93% on RA. Labs were notable for WBC 13.3, Hgb 18.1 (suspect concentrated, baseline ~14), Plt 364. Na 138, K 4.3, CO2 22, BUN/Cr 12/1.01, phos 3.2, Mg 2.3, AST mildly elevated to 43, LFTs otherwise WNL. Ethanol 370.   PCCM consulted for ICU admission for delirium tremens and Precedex  initiation.  On admission, patient denies fever/chills, CP/SOB, n/v/d. No recent symptoms of illness. Endorses back pain. Denies edema.  Pertinent Medical History:   Past Medical History:  Diagnosis Date   Alcohol  abuse    Anxiety    Class 1 obesity 09/14/2021   Depression    Significant Hospital Events: Including procedures, antibiotic start and stop dates in addition to other pertinent events   7/16 - Presented to [facility] ED for EtOH withdrawal and concern for DTs. Phenobarbital , Precedex  initiated. PCCM consulted for ICU admission. 7/18 on Precedex , did require bolus of phenobarb . Interim History / Subjective:  On Precedex  On tapering doses of phenobarb Requiring Versed  as needed  Objective:  Blood pressure (!) 113/90, pulse 72, temperature 98.5 F (36.9 C), temperature source Axillary, resp. rate 15, height 5' 10 (1.778 m), weight 85.8 kg, SpO2 94%.        Intake/Output Summary (Last 24 hours) at 12/14/2023 1019 Last data filed at 12/14/2023 0502 Gross per 24 hour   Intake 1366.64 ml  Output 2925 ml  Net -1558.36 ml   Filed Weights   12/12/23 0500 12/13/23 0500 12/14/23 0500  Weight: 85.6 kg 84.7 kg 85.8 kg   Physical Examination: General: Middle-age, does not appear to be in distress HEENT: Moist oral mucosa  neuro: Arouses to voice, not following commands CV: S1-S2 appreciated PULM: Clear breath sounds GI: Bowel sounds appreciated Extremities: No clubbing, no edema Skin: No rash  I reviewed last 24 h vitals and pain scores, last 48 h intake and output, last 24 h labs and trends, and last 24 h imaging results. Resolved Hospital Problem List:    Assessment & Plan:   EtOH withdrawal with delirium tremens - On phenobarb taper - On Precedex  - CIWA in place -P.o. diazepam  - Continue thiamine  folate multivitamin  Anxiety/depression - Counsel regarding compliance  History of chronic back pain - Home Zanaflex on hold  History of GERD - On PPI  Electrolyte derangement - Continue to monitor and replete  Leukocytosis - No signs of infection - Continue to monitor  Risk of decompensation still remains high requiring intravenous sedation   Best Practice: (right click and Reselect all SmartList Selections daily)   Diet/type: Regular consistency (see orders) DVT prophylaxis: SCDs, SQH GI prophylaxis: PPI Lines: N/A Foley:  N/A Code Status:  full code Last date of multidisciplinary goals of care discussion [Pending]  Labs:  CBC: Recent Labs  Lab 12/11/23 0022 12/12/23 0257  WBC 13.3* 11.0*  HGB 18.1* 15.2  HCT 54.1* 46.4  MCV 89.6 91.7  PLT 364 225   Basic Metabolic Panel: Recent Labs  Lab 12/11/23 0022 12/12/23 0257 12/13/23 0305 12/14/23 0843  NA 138 134* 134* 133*  K 4.3 3.9 4.0 4.4  CL 96* 95* 95* 96*  CO2 22 28 27 24   GLUCOSE 83 108* 104* 95  BUN 12 17 11 13   CREATININE 1.01 0.97 0.85 1.07  CALCIUM  9.0 8.6* 8.7* 8.7*  MG 2.3 2.1 2.4 2.1  PHOS 3.2 2.2* 2.6 2.9   GFR: Estimated Creatinine  Clearance: 77.7 mL/min (by C-G formula based on SCr of 1.07 mg/dL). Recent Labs  Lab 12/11/23 0022 12/12/23 0257  WBC 13.3* 11.0*   Liver Function Tests: Recent Labs  Lab 12/11/23 0022  AST 43*  ALT 31  ALKPHOS 48  BILITOT 0.7  PROT 8.1  ALBUMIN 4.4   No results for input(s): LIPASE, AMYLASE in the last 168 hours. No results for input(s): AMMONIA in the last 168 hours.  ABG:    Component Value Date/Time   PHART 7.4 01/18/2023 0950   PCO2ART 38 01/18/2023 0950   PO2ART 96 01/18/2023 0950   HCO3 23.5 01/18/2023 0950   ACIDBASEDEF 1.1 01/18/2023 0950   O2SAT 99.5 01/18/2023 0950    Coagulation Profile: No results for input(s): INR, PROTIME in the last 168 hours.  Cardiac Enzymes: No results for input(s): CKTOTAL, CKMB, CKMBINDEX, TROPONINI in the last 168 hours.  HbA1C: Hgb A1c MFr Bld  Date/Time Value Ref Range Status  01/16/2023 02:57 AM 5.4 4.8 - 5.6 % Final    Comment:    (NOTE) Pre diabetes:          5.7%-6.4%  Diabetes:              >6.4%  Glycemic control for   <7.0% adults with diabetes   03/20/2019 03:46 AM 5.4 4.8 - 5.6 % Final    Comment:    (NOTE) Pre diabetes:          5.7%-6.4% Diabetes:              >6.4% Glycemic control for   <7.0% adults with diabetes    The patient is critically ill with multiple organ systems failure and requires high complexity decision making for assessment and support, frequent evaluation and titration of therapies, application of advanced monitoring technologies and extensive interpretation of multiple databases. Critical Care Time devoted to patient care services described in this note independent of APP/resident time (if applicable)  is 32 minutes.   Jennet Epley MD [place] Personal pager: See Amion If unanswered, please page CCM On-call: [phone]

## 2023-12-14 NOTE — Progress Notes (Signed)
 eLink Physician-Brief Progress Note Patient Name: Jermaine [name] DOB: 1965/07/26 MRN: [id number]   Date of Service  12/14/2023  HPI/Events of Note  Patient shaking, sweating, hallucinating. Can't give him anything more for another hour. On 0.7 of precedex . Just got 2 of versed  and 65 of phenobarbital . Also on Diazepam  5 mg BID and Precedex  currently at 0.8 was as high as 1.7 earlier today  eICU Interventions  Ordered Phenobarbital  130 mg IV x 1 Titrating up Precedex  as tolerated Discussed with BSRN     Intervention Category Minor Interventions: Agitation / anxiety - evaluation and management  Jermaine [name] 12/14/2023, 10:56 PM

## 2023-12-14 NOTE — Progress Notes (Signed)
 eLink Physician-Brief Progress Note Patient Name: Jermaine [name] DOB: [birth year] MRN: [id number]   Date of Service  12/14/2023  HPI/Events of Note  Lots of diarrhea. Bedside RN is requesting some immodium for the patient.   eICU Interventions  Immodium 2 mg prn ordered x 1     Intervention Category Minor Interventions: Routine modifications to care plan (e.g. PRN medications for pain, fever)  Damien DASEN Jaxston Chohan 12/14/2023, 9:26 PM

## 2023-12-15 DIAGNOSIS — K219 Gastro-esophageal reflux disease without esophagitis: Secondary | ICD-10-CM | POA: Diagnosis not present

## 2023-12-15 DIAGNOSIS — D72829 Elevated white blood cell count, unspecified: Secondary | ICD-10-CM | POA: Diagnosis not present

## 2023-12-15 DIAGNOSIS — F10931 Alcohol use, unspecified with withdrawal delirium: Secondary | ICD-10-CM | POA: Diagnosis not present

## 2023-12-15 DIAGNOSIS — E878 Other disorders of electrolyte and fluid balance, not elsewhere classified: Secondary | ICD-10-CM | POA: Diagnosis not present

## 2023-12-15 LAB — BASIC METABOLIC PANEL WITH GFR
Anion gap: 10 (ref 5–15)
BUN: 18 mg/dL (ref 6–20)
CO2: 27 mmol/L (ref 22–32)
Calcium: 8.6 mg/dL — ABNORMAL LOW (ref 8.9–10.3)
Chloride: 99 mmol/L (ref 98–111)
Creatinine, Ser: 1.12 mg/dL (ref 0.61–1.24)
GFR, Estimated: >60 mL/min (ref >60)
Glucose, Bld: 99 mg/dL (ref 70–99)
Potassium: 4 mmol/L (ref 3.5–5.1)
Sodium: 136 mmol/L (ref 135–145)

## 2023-12-15 LAB — CBC
HCT: 50.8 % (ref 39.0–52.0)
Hemoglobin: 16.3 g/dL (ref 13.0–17.0)
MCH: 29.1 pg (ref 26.0–34.0)
MCHC: 32.1 g/dL (ref 30.0–36.0)
MCV: 90.6 fL (ref 80.0–100.0)
Platelets: 202 K/uL (ref 150–400)
RBC: 5.61 MIL/uL (ref 4.22–5.81)
RDW: 12.6 % (ref 11.5–15.5)
WBC: 11.5 K/uL — ABNORMAL HIGH (ref 4.0–10.5)
nRBC: 0 % (ref 0.0–0.2)

## 2023-12-15 LAB — MAGNESIUM: Magnesium: 2.3 mg/dL (ref 1.7–2.4)

## 2023-12-15 LAB — PHOSPHORUS: Phosphorus: 3.8 mg/dL (ref 2.5–4.6)

## 2023-12-15 MED ORDER — SODIUM CHLORIDE 0.9% FLUSH: 10.0000 mL | INTRAVENOUS | Status: DC | PRN

## 2023-12-15 MED ORDER — LORAZEPAM 1 MG PO TABS: 1.0000 mg | ORAL_TABLET | ORAL | Status: DC | PRN

## 2023-12-15 MED ORDER — IBUPROFEN 200 MG PO TABS
400.0000 mg | ORAL_TABLET | Freq: Four times a day (QID) | ORAL | Status: DC | PRN
  Administered 2023-12-15: 400 mg via ORAL
  Filled 2023-12-15: qty 2

## 2023-12-15 MED ORDER — PANTOPRAZOLE SODIUM 40 MG PO TBEC
40.0000 mg | DELAYED_RELEASE_TABLET | Freq: Every day | ORAL | Status: DC
  Administered 2023-12-15 – 2023-12-21 (×7): 40 mg via ORAL
  Filled 2023-12-15 (×7): qty 1

## 2023-12-15 MED ORDER — DIAZEPAM 5 MG PO TABS
10.0000 mg | ORAL_TABLET | Freq: Two times a day (BID) | ORAL | Status: DC
  Administered 2023-12-15 – 2023-12-16 (×4): 10 mg via ORAL
  Filled 2023-12-15 (×4): qty 2

## 2023-12-15 MED ORDER — DIAZEPAM 5 MG/ML IJ SOLN
5.0000 mg | INTRAMUSCULAR | Status: DC | PRN
  Administered 2023-12-15 – 2023-12-16 (×7): 5 mg via INTRAVENOUS
  Filled 2023-12-15 (×7): qty 2

## 2023-12-15 NOTE — Plan of Care (Signed)
  Problem: Clinical Measurements: Goal: Will remain free from infection Outcome: Progressing   Problem: Nutrition: Goal: Adequate nutrition will be maintained Outcome: Progressing   Problem: Education: Goal: Knowledge of General Education information will improve Description: Including pain rating scale, medication(s)/side effects and non-pharmacologic comfort measures Outcome: Not Progressing   Problem: Health Behavior/Discharge Planning: Goal: Ability to manage health-related needs will improve Outcome: Not Progressing   Problem: Clinical Measurements: Goal: Ability to maintain clinical measurements within normal limits will improve Outcome: Not Progressing   Problem: Activity: Goal: Risk for activity intolerance will decrease Outcome: Not Progressing   Problem: Coping: Goal: Level of anxiety will decrease Outcome: Not Progressing

## 2023-12-15 NOTE — Plan of Care (Signed)

## 2023-12-15 NOTE — Progress Notes (Signed)
 NAME:  Jermaine [name], MRN:  [id number], DOB:  1966/03/31, LOS: 4 ADMISSION DATE:  12/10/2023 CONSULTATION DATE:  12/11/2023 REFERRING MD:  Garrick - EDP, CHIEF COMPLAINT:  EtOH withdrawal   History of Present Illness:  58 year old man who presented to [facility] ED 7/16 with EtOH withdrawal, concern for DTs. PMHx significant for EtOH abuse (1 bottle of vodka daily) with history of withdrawal/DTs, anixety/depression, obesity. Recent admission 3/2 - 3/7 for EtOH withdrawal.  Patient presented to ED with complaint of feeling like he was going into DTs. Last drink ~5 hours PTA. On arrival, patient was afebrile with HR 118, RR 22, BP 137/93, SpO2 93% on RA. Labs were notable for WBC 13.3, Hgb 18.1 (suspect concentrated, baseline ~14), Plt 364. Na 138, K 4.3, CO2 22, BUN/Cr 12/1.01, phos 3.2, Mg 2.3, AST mildly elevated to 43, LFTs otherwise WNL. Ethanol 370.   PCCM consulted for ICU admission for delirium tremens and Precedex  initiation.  On admission, patient denies fever/chills, CP/SOB, n/v/d. No recent symptoms of illness. Endorses back pain. Denies edema.  Pertinent Medical History:   Past Medical History:  Diagnosis Date   Alcohol  abuse    Anxiety    Class 1 obesity 09/14/2021   Depression    Significant Hospital Events: Including procedures, antibiotic start and stop dates in addition to other pertinent events   7/16 - Presented to [facility] ED for EtOH withdrawal and concern for DTs. Phenobarbital , Precedex  initiated. PCCM consulted for ICU admission. 7/18 on Precedex , did require bolus of phenobarb 7/20-on Precedex  tapering doses of phenobarb . Interim History / Subjective:  Remains on Precedex  Alert and interactive Still with visual hallucinations-seeing somebody stand next. Blood pressure heart rate steady Objective:  Blood pressure 130/77, pulse (!) 58, temperature 98.1 F (36.7 C), temperature source Oral, resp. rate 19, height 5' 10 (1.778 m), weight 83.3 kg, SpO2 99%.         Intake/Output Summary (Last 24 hours) at 12/15/2023 0834 Last data filed at 12/15/2023 0757 Gross per 24 hour  Intake 727.65 ml  Output 1700 ml  Net -972.35 ml   Filed Weights   12/13/23 0500 12/14/23 0500 12/15/23 0139  Weight: 84.7 kg 85.8 kg 83.3 kg   Physical Examination: General: Middle-age, does not appear to be in distress HEENT: Moist oral mucosa  neuro: Awake alert interactive, still a little jittery  CV: S1-S2 appreciated PULM: Clear breath sounds GI: Bowel sounds appreciated Extremities: No clubbing, no edema Skin: No rash  I reviewed last 24 h vitals and pain scores, last 48 h intake and output, last 24 h labs and trends, and last 24 h imaging results. Resolved Hospital Problem List:    Assessment & Plan:   EtOH withdrawal with delirium tremens On phenobarb taper - Remains on Precedex , will continue to try to wean this off - Has required as needed doses of phenobarb for agitation - CIWA in place - Added p.o. diazepam , will increase the dose from 5 mg twice daily to 10 mg twice daily - Continue thiamine , folate, multivitamin  History of anxiety/depression  History of chronic back pain - Home Zanaflex on hold  No evidence of ongoing infection  Risk of decompensation remains high as it is still requiring intravenous Precedex  for sedation, still with hallucinations   Best Practice: (right click and Reselect all SmartList Selections daily)   Diet/type: Regular consistency (see orders) DVT prophylaxis: SCDs, SQH GI prophylaxis: PPI Lines: N/A Foley:  N/A Code Status:  full code Last date of multidisciplinary goals  of care discussion [Pending]  Labs:  CBC: Recent Labs  Lab 12/11/23 0022 12/12/23 0257 12/14/23 1048 12/15/23 0705  WBC 13.3* 11.0* 9.6 11.5*  HGB 18.1* 15.2 16.9 16.3  HCT 54.1* 46.4 53.0* 50.8  MCV 89.6 91.7 91.7 90.6  PLT 364 225 213 202   Basic Metabolic Panel: Recent Labs  Lab 12/11/23 0022 12/12/23 0257 12/13/23 0305  12/14/23 0843 12/15/23 0705  NA 138 134* 134* 133* 136  K 4.3 3.9 4.0 4.4 4.0  CL 96* 95* 95* 96* 99  CO2 22 28 27 24 27   GLUCOSE 83 108* 104* 95 99  BUN 12 17 11 13 18   CREATININE 1.01 0.97 0.85 1.07 1.12  CALCIUM  9.0 8.6* 8.7* 8.7* 8.6*  MG 2.3 2.1 2.4 2.1 2.3  PHOS 3.2 2.2* 2.6 2.9 3.8   GFR: Estimated Creatinine Clearance: 74.2 mL/min (by C-G formula based on SCr of 1.12 mg/dL). Recent Labs  Lab 12/11/23 0022 12/12/23 0257 12/14/23 1048 12/15/23 0705  WBC 13.3* 11.0* 9.6 11.5*   Liver Function Tests: Recent Labs  Lab 12/11/23 0022  AST 43*  ALT 31  ALKPHOS 48  BILITOT 0.7  PROT 8.1  ALBUMIN 4.4   No results for input(s): LIPASE, AMYLASE in the last 168 hours. No results for input(s): AMMONIA in the last 168 hours.  ABG:    Component Value Date/Time   PHART 7.4 01/18/2023 0950   PCO2ART 38 01/18/2023 0950   PO2ART 96 01/18/2023 0950   HCO3 23.5 01/18/2023 0950   ACIDBASEDEF 1.1 01/18/2023 0950   O2SAT 99.5 01/18/2023 0950    Coagulation Profile: No results for input(s): INR, PROTIME in the last 168 hours.  Cardiac Enzymes: No results for input(s): CKTOTAL, CKMB, CKMBINDEX, TROPONINI in the last 168 hours.  HbA1C: Hgb A1c MFr Bld  Date/Time Value Ref Range Status  01/16/2023 02:57 AM 5.4 4.8 - 5.6 % Final    Comment:    (NOTE) Pre diabetes:          5.7%-6.4%  Diabetes:              >6.4%  Glycemic control for   <7.0% adults with diabetes   03/20/2019 03:46 AM 5.4 4.8 - 5.6 % Final    Comment:    (NOTE) Pre diabetes:          5.7%-6.4% Diabetes:              >6.4% Glycemic control for   <7.0% adults with diabetes    The patient is critically ill with multiple organ systems failure and requires high complexity decision making for assessment and support, frequent evaluation and titration of therapies, application of advanced monitoring technologies and extensive interpretation of multiple databases. Critical Care Time  devoted to patient care services described in this note independent of APP/resident time (if applicable)  is 32 minutes.   Jennet Epley MD [facility] Personal pager: See Amion If unanswered, please page CCM On-call: #[phone]

## 2023-12-16 DIAGNOSIS — F10931 Alcohol use, unspecified with withdrawal delirium: Secondary | ICD-10-CM | POA: Diagnosis not present

## 2023-12-16 LAB — BASIC METABOLIC PANEL WITH GFR
Anion gap: 9 (ref 5–15)
BUN: 17 mg/dL (ref 6–20)
CO2: 28 mmol/L (ref 22–32)
Calcium: 8.8 mg/dL — ABNORMAL LOW (ref 8.9–10.3)
Chloride: 99 mmol/L (ref 98–111)
Creatinine, Ser: 1.23 mg/dL (ref 0.61–1.24)
GFR, Estimated: >60 mL/min (ref >60)
Glucose, Bld: 104 mg/dL — ABNORMAL HIGH (ref 70–99)
Potassium: 3.8 mmol/L (ref 3.5–5.1)
Sodium: 136 mmol/L (ref 135–145)

## 2023-12-16 LAB — CBC
HCT: 46.9 % (ref 39.0–52.0)
Hemoglobin: 15.5 g/dL (ref 13.0–17.0)
MCH: 30.2 pg (ref 26.0–34.0)
MCHC: 33 g/dL (ref 30.0–36.0)
MCV: 91.2 fL (ref 80.0–100.0)
Platelets: 197 K/uL (ref 150–400)
RBC: 5.14 MIL/uL (ref 4.22–5.81)
RDW: 12.8 % (ref 11.5–15.5)
WBC: 9.7 K/uL (ref 4.0–10.5)
nRBC: 0 % (ref 0.0–0.2)

## 2023-12-16 LAB — PHOSPHORUS: Phosphorus: 4 mg/dL (ref 2.5–4.6)

## 2023-12-16 LAB — MAGNESIUM: Magnesium: 2.3 mg/dL (ref 1.7–2.4)

## 2023-12-16 MED ORDER — IBUPROFEN 200 MG PO TABS
400.0000 mg | ORAL_TABLET | ORAL | Status: DC | PRN
  Administered 2023-12-16 – 2023-12-17 (×4): 400 mg via ORAL
  Filled 2023-12-16 (×4): qty 2

## 2023-12-16 MED ORDER — DIAZEPAM 5 MG/ML IJ SOLN
10.0000 mg | Freq: Once | INTRAMUSCULAR | Status: AC
  Administered 2023-12-16: 10 mg via INTRAVENOUS
  Filled 2023-12-16: qty 2

## 2023-12-16 MED ORDER — DIAZEPAM 5 MG/ML IJ SOLN
10.0000 mg | INTRAMUSCULAR | Status: DC | PRN
  Administered 2023-12-16 – 2023-12-17 (×9): 10 mg via INTRAVENOUS
  Filled 2023-12-16 (×10): qty 2

## 2023-12-16 MED ORDER — LORAZEPAM 1 MG PO TABS: 1.0000 mg | ORAL_TABLET | ORAL | Status: DC | PRN

## 2023-12-16 NOTE — Progress Notes (Addendum)
 Patient with multiple instances of aggression, continuous hallucinations for approximately 24-36 hours, continued confusion, disoriented to place, time and situation, which is a decline in mentation since the beginning of shift, but does mirror his mentation on the morning of 7/20.   Reached out multiple times to MD, multiple medications changed. Patient is known to the unit and staff, and is known to require high doses of medication in order to control his withdraw symptoms, which had been conveyed to MD.   Patient awoke at approximately 0600 angry, hostile, and confused; patient was not redirectable. Precedex  increased to 1.9 (ceiling of 2), and Elink notified again, during which Elink nurse could hear agitated patient yelling. Patient continued to speak about hallucinating, stating he was a mummy, that he was supposed to be the hero, that there are people poking their heads in here telling me I have to open the door and let them in. Attempted multiple therapeutic techniques which were unsuccessful. Approx. 10 minutes later, patient began to settle down, but was notable still agitated. Osei-Bonsu, MD contacted this RN and assessed patient via video.   Per MD, precedex  ceiling too high. Precedex  turned down to 1.5 per MD verbal order, and 10 mg IV valium  given. Will reassess.

## 2023-12-16 NOTE — Plan of Care (Signed)
  Problem: Clinical Measurements: Goal: Ability to maintain clinical measurements within normal limits will improve Outcome: Progressing Goal: Will remain free from infection Outcome: Progressing Goal: Diagnostic test results will improve Outcome: Progressing Goal: Respiratory complications will improve Outcome: Progressing   Problem: Nutrition: Goal: Adequate nutrition will be maintained Outcome: Progressing   Problem: Elimination: Goal: Will not experience complications related to bowel motility Outcome: Progressing Goal: Will not experience complications related to urinary retention Outcome: Progressing   Problem: Pain Managment: Goal: General experience of comfort will improve and/or be controlled Outcome: Progressing   Problem: Coping: Goal: Level of anxiety will decrease Outcome: Not Progressing

## 2023-12-16 NOTE — Progress Notes (Signed)
 NAME:  Jermaine [name], MRN:  [id number], DOB:  [birth year], LOS: 5 ADMISSION DATE:  12/10/2023 CONSULTATION DATE:  12/11/2023 REFERRING MD:  Garrick - EDP, CHIEF COMPLAINT:  EtOH withdrawal   History of Present Illness:  58 year old man who presented to [facility] ED 7/16 with EtOH withdrawal, concern for DTs. PMHx significant for EtOH abuse (1 bottle of vodka daily) with history of withdrawal/DTs, anixety/depression, obesity. Recent admission 3/2 - 3/7 for EtOH withdrawal.  Patient presented to ED with complaint of feeling like he was going into DTs. Last drink ~5 hours PTA. On arrival, patient was afebrile with HR 118, RR 22, BP 137/93, SpO2 93% on RA. Labs were notable for WBC 13.3, Hgb 18.1 (suspect concentrated, baseline ~14), Plt 364. Na 138, K 4.3, CO2 22, BUN/Cr 12/1.01, phos 3.2, Mg 2.3, AST mildly elevated to 43, LFTs otherwise WNL. Ethanol 370.   PCCM consulted for ICU admission for delirium tremens and Precedex  initiation.  On admission, patient denies fever/chills, CP/SOB, n/v/d. No recent symptoms of illness. Endorses back pain. Denies edema.  Pertinent Medical History:   Past Medical History:  Diagnosis Date   Alcohol  abuse    Anxiety    Class 1 obesity 09/14/2021   Depression    Significant Hospital Events: Including procedures, antibiotic start and stop dates in addition to other pertinent events   7/16 - Presented to [facility] ED for EtOH withdrawal and concern for DTs. Phenobarbital , Precedex  initiated. PCCM consulted for ICU admission. 7/18 on Precedex , did require bolus of phenobarb 7/20-on Precedex  tapering doses of phenobarb . Interim History / Subjective:  Sedated this morning Delirium this morning, visual hallucinations, very agitated Received Valium , remains on Precedex  Objective:  Blood pressure (!) 110/44, pulse 64, temperature 99.1 F (37.3 C), temperature source Oral, resp. rate (!) 27, height 5' 10 (1.778 m), weight 88.1 kg, SpO2 98%.        Intake/Output  Summary (Last 24 hours) at 12/16/2023 9077 Last data filed at 12/16/2023 0900 Gross per 24 hour  Intake 1489.4 ml  Output 3475 ml  Net -1985.6 ml   Filed Weights   12/14/23 0500 12/15/23 0139 12/16/23 0400  Weight: 85.8 kg 83.3 kg 88.1 kg   Physical Examination: General: Middle-age, does not appear to be in distress HEENT: Moist oral mucosa neuro: Sedated this morning  CV: S1-S2 appreciated PULM: Clear breath sounds GI: Bowel sounds appreciated Extremities: No clubbing, no edema Skin: No rash  I reviewed last 24 h vitals and pain scores, last 48 h intake and output, last 24 h labs and trends, and last 24 h imaging results. Resolved Hospital Problem List:    Assessment & Plan:   EtOH withdrawal with delirium tremens - Remains on phenobarb taper - Remains on Precedex  -Attempt weaning off Precedex  after no successful still remains with significant hallucinations and agitation -On Valium  10 mg twice a day with as needed pushes for CIWA -Thiamine , folate multivitamin  History of anxiety/depression  History of chronic back pain -Home Zanaflex on hold  No evidence of ongoing infection    Best Practice: (right click and Reselect all SmartList Selections daily)   Diet/type: Regular consistency (see orders) DVT prophylaxis: SCDs, SQH GI prophylaxis: PPI Lines: N/A Foley:  N/A Code Status:  full code Last date of multidisciplinary goals of care discussion [Pending]  Labs:  CBC: Recent Labs  Lab 12/11/23 0022 12/12/23 0257 12/14/23 1048 12/15/23 0705 12/16/23 0332  WBC 13.3* 11.0* 9.6 11.5* 9.7  HGB 18.1* 15.2 16.9 16.3 15.5  HCT  54.1* 46.4 53.0* 50.8 46.9  MCV 89.6 91.7 91.7 90.6 91.2  PLT 364 225 213 202 197   Basic Metabolic Panel: Recent Labs  Lab 12/12/23 0257 12/13/23 0305 12/14/23 0843 12/15/23 0705 12/16/23 0332  NA 134* 134* 133* 136 136  K 3.9 4.0 4.4 4.0 3.8  CL 95* 95* 96* 99 99  CO2 28 27 24 27 28   GLUCOSE 108* 104* 95 99 104*  BUN 17  11 13 18 17   CREATININE 0.97 0.85 1.07 1.12 1.23  CALCIUM  8.6* 8.7* 8.7* 8.6* 8.8*  MG 2.1 2.4 2.1 2.3 2.3  PHOS 2.2* 2.6 2.9 3.8 4.0   GFR: Estimated Creatinine Clearance: 73.1 mL/min (by C-G formula based on SCr of 1.23 mg/dL). Recent Labs  Lab 12/12/23 0257 12/14/23 1048 12/15/23 0705 12/16/23 0332  WBC 11.0* 9.6 11.5* 9.7   Liver Function Tests: Recent Labs  Lab 12/11/23 0022  AST 43*  ALT 31  ALKPHOS 48  BILITOT 0.7  PROT 8.1  ALBUMIN 4.4   No results for input(s): LIPASE, AMYLASE in the last 168 hours. No results for input(s): AMMONIA in the last 168 hours.  ABG:    Component Value Date/Time   PHART 7.4 01/18/2023 0950   PCO2ART 38 01/18/2023 0950   PO2ART 96 01/18/2023 0950   HCO3 23.5 01/18/2023 0950   ACIDBASEDEF 1.1 01/18/2023 0950   O2SAT 99.5 01/18/2023 0950    Coagulation Profile: No results for input(s): INR, PROTIME in the last 168 hours.  Cardiac Enzymes: No results for input(s): CKTOTAL, CKMB, CKMBINDEX, TROPONINI in the last 168 hours.  HbA1C: Hgb A1c MFr Bld  Date/Time Value Ref Range Status  01/16/2023 02:57 AM 5.4 4.8 - 5.6 % Final    Comment:    (NOTE) Pre diabetes:          5.7%-6.4%  Diabetes:              >6.4%  Glycemic control for   <7.0% adults with diabetes   03/20/2019 03:46 AM 5.4 4.8 - 5.6 % Final    Comment:    (NOTE) Pre diabetes:          5.7%-6.4% Diabetes:              >6.4% Glycemic control for   <7.0% adults with diabetes    The patient is critically ill with multiple organ systems failure and requires high complexity decision making for assessment and support, frequent evaluation and titration of therapies, application of advanced monitoring technologies and extensive interpretation of multiple databases. Critical Care Time devoted to patient care services described in this note independent of APP/resident time (if applicable)  is 31 minutes.   Jennet Epley MD [facility]  [facility] Personal pager: See Amion If unanswered, please page CCM On-call: #[phone]

## 2023-12-16 NOTE — Progress Notes (Addendum)
 eLink Physician-Brief Progress Note Patient Name: Jermaine [name] DOB: 05-18-1966 MRN: [id number]   Date of Service  12/16/2023  HPI/Events of Note  Patient is delirious and attempting to get out of bed.  Has as needed diazepam  5 mg Q1 ordered.  eICU Interventions  Will change diazepam  dose to 10 mg to see if it helps.  Discussed with RN.     Intervention Category Intermediate Interventions: Other: (Delirium)  Jerilynn Berg 12/16/2023, 1:28 AM  Follow-up: Patient is still agitated and is requiring more diazepam .  Will give an extra 10 mg now IV.  Discussed with RN. 6:12 AM.

## 2023-12-16 NOTE — Plan of Care (Signed)
  Problem: Clinical Measurements: Goal: Ability to maintain clinical measurements within normal limits will improve Outcome: Progressing   Problem: Nutrition: Goal: Adequate nutrition will be maintained Outcome: Progressing   Problem: Coping: Goal: Level of anxiety will decrease Outcome: Progressing   Problem: Pain Managment: Goal: General experience of comfort will improve and/or be controlled Outcome: Progressing

## 2023-12-17 DIAGNOSIS — F10931 Alcohol use, unspecified with withdrawal delirium: Secondary | ICD-10-CM | POA: Diagnosis not present

## 2023-12-17 LAB — BASIC METABOLIC PANEL WITH GFR
Anion gap: 13 (ref 5–15)
BUN: 14 mg/dL (ref 6–20)
CO2: 22 mmol/L (ref 22–32)
Calcium: 9 mg/dL (ref 8.9–10.3)
Chloride: 102 mmol/L (ref 98–111)
Creatinine, Ser: 1.07 mg/dL (ref 0.61–1.24)
GFR, Estimated: >60 mL/min (ref >60)
Glucose, Bld: 125 mg/dL — ABNORMAL HIGH (ref 70–99)
Potassium: 4 mmol/L (ref 3.5–5.1)
Sodium: 137 mmol/L (ref 135–145)

## 2023-12-17 LAB — CBC
HCT: 47.6 % (ref 39.0–52.0)
Hemoglobin: 14.7 g/dL (ref 13.0–17.0)
MCH: 29.4 pg (ref 26.0–34.0)
MCHC: 30.9 g/dL (ref 30.0–36.0)
MCV: 95.2 fL (ref 80.0–100.0)
Platelets: 189 K/uL (ref 150–400)
RBC: 5 MIL/uL (ref 4.22–5.81)
RDW: 13 % (ref 11.5–15.5)
WBC: 7.6 K/uL (ref 4.0–10.5)
nRBC: 0 % (ref 0.0–0.2)

## 2023-12-17 LAB — PHOSPHORUS: Phosphorus: 2.5 mg/dL (ref 2.5–4.6)

## 2023-12-17 LAB — MAGNESIUM: Magnesium: 2 mg/dL (ref 1.7–2.4)

## 2023-12-17 MED ORDER — GUANFACINE HCL 1 MG PO TABS
2.0000 mg | ORAL_TABLET | Freq: Every day | ORAL | Status: DC
  Administered 2023-12-17 – 2023-12-18 (×2): 2 mg via ORAL
  Filled 2023-12-17 (×2): qty 2

## 2023-12-17 MED ORDER — DIAZEPAM 5 MG PO TABS
10.0000 mg | ORAL_TABLET | Freq: Three times a day (TID) | ORAL | Status: DC
  Administered 2023-12-17 – 2023-12-21 (×12): 10 mg via ORAL
  Filled 2023-12-17 (×12): qty 2

## 2023-12-17 MED ORDER — QUETIAPINE FUMARATE 25 MG PO TABS
50.0000 mg | ORAL_TABLET | Freq: Two times a day (BID) | ORAL | Status: DC
  Administered 2023-12-17 – 2023-12-19 (×5): 50 mg via ORAL
  Filled 2023-12-17 (×5): qty 1

## 2023-12-17 MED ORDER — DIAZEPAM 5 MG PO TABS: 5.0000 mg | ORAL_TABLET | Freq: Three times a day (TID) | ORAL | Status: DC

## 2023-12-17 MED ORDER — MELATONIN 3 MG PO TABS
3.0000 mg | ORAL_TABLET | Freq: Every day | ORAL | Status: DC
  Administered 2023-12-17 – 2023-12-20 (×4): 3 mg via ORAL
  Filled 2023-12-17 (×4): qty 1

## 2023-12-17 MED ORDER — DIAZEPAM 2 MG PO TABS: 2.0000 mg | ORAL_TABLET | Freq: Every day | ORAL | Status: DC

## 2023-12-17 MED ORDER — DIAZEPAM 5 MG PO TABS
10.0000 mg | ORAL_TABLET | Freq: Three times a day (TID) | ORAL | Status: DC
  Administered 2023-12-17: 10 mg via ORAL
  Filled 2023-12-17: qty 2

## 2023-12-17 MED ORDER — DIAZEPAM 2 MG PO TABS: 2.0000 mg | ORAL_TABLET | Freq: Three times a day (TID) | ORAL | Status: DC

## 2023-12-17 MED ORDER — DIAZEPAM 2 MG PO TABS: 2.0000 mg | ORAL_TABLET | Freq: Two times a day (BID) | ORAL | Status: DC

## 2023-12-17 NOTE — TOC Progression Note (Signed)
 Transition of Care [facility]) - Progression Note    Patient Details  Name: Sedale Jenifer MRN: [id number] Date of Birth: Sep 18, 1965  Transition of [facility]) CM/SW Contact  Jon ONEIDA Anon, RN Phone Number: 12/17/2023, 2:25 PM  Clinical Narrative:    Pt remains in ICU level of care. Pt not medically ready to discharge. TOC continuing to follow.   Expected Discharge Plan: Home/Self Care Barriers to Discharge: Continued Medical Work up, Requiring sitter/restraints  Expected Discharge Plan and Services In-house Referral: Clinical Social Work Discharge Planning Services: CM Consult Post Acute Care Choice: NA Living arrangements for the past 2 months: Single Family Home                 DME Arranged: N/A DME Agency: NA       HH Arranged: NA HH Agency: NA         Social Determinants of Health (SDOH) Interventions SDOH Screenings   Food Insecurity: Patient Declined (07/28/2023)  Housing: Low Risk  (12/11/2023)  Transportation Needs: No Transportation Needs (12/11/2023)  Utilities: Not At Risk (12/11/2023)  Tobacco Use: Low Risk  (12/10/2023)    Readmission Risk Interventions    12/13/2023   12:29 PM 07/29/2023    3:07 PM 05/13/2023    2:46 PM  Readmission Risk Prevention Plan  Transportation Screening Complete Complete Complete  PCP or Specialist Appt within 3-5 Days   Complete  HRI or Home Care Consult   Complete  Social Work Consult for Recovery Care Planning/Counseling   Complete  Palliative Care Screening   Not Applicable  Medication Review Oceanographer) Complete Complete Complete  PCP or Specialist appointment within 3-5 days of discharge Complete    HRI or Home Care Consult Complete Complete   SW Recovery Care/Counseling Consult Complete Complete   Palliative Care Screening Not Applicable Not Applicable   Skilled Nursing Facility Not Applicable Not Applicable

## 2023-12-17 NOTE — Progress Notes (Signed)
 He has done well over the course of the day.  He has been up and ambulating with staff.  Precedex  is stated 0.06 mcg/kg/min Plan Initiating his Seroquel  tonight Plan to discontinue Dex tomorrow Continue slow Valium  taper as outlined in the medical records Continue guanfacine . Note this will need to be tapered slowly over 3-4days when stopping

## 2023-12-17 NOTE — Progress Notes (Signed)
 NAME:  Jermaine [name], MRN:  [id number], DOB:  21-Nov-1965, LOS: 6 ADMISSION DATE:  12/10/2023 CONSULTATION DATE:  12/11/2023 REFERRING MD:  Garrick - EDP, CHIEF COMPLAINT:  EtOH withdrawal   History of Present Illness:  58 year old man who presented to [facility] ED 7/16 with EtOH withdrawal, concern for DTs. PMHx significant for EtOH abuse (1 bottle of vodka daily) with history of withdrawal/DTs, anixety/depression, obesity. Recent admission 3/2 - 3/7 for EtOH withdrawal.  Patient presented to ED with complaint of feeling like he was going into DTs. Last drink ~5 hours PTA. On arrival, patient was afebrile with HR 118, RR 22, BP 137/93, SpO2 93% on RA. Labs were notable for WBC 13.3, Hgb 18.1 (suspect concentrated, baseline ~14), Plt 364. Na 138, K 4.3, CO2 22, BUN/Cr 12/1.01, phos 3.2, Mg 2.3, AST mildly elevated to 43, LFTs otherwise WNL. Ethanol 370.   PCCM consulted for ICU admission for delirium tremens and Precedex  initiation.  On admission, patient denies fever/chills, CP/SOB, n/v/d. No recent symptoms of illness. Endorses back pain. Denies edema.  Pertinent Medical History:   Past Medical History:  Diagnosis Date   Alcohol  abuse    Anxiety    Class 1 obesity 09/14/2021   Depression    Significant Hospital Events: Including procedures, antibiotic start and stop dates in addition to other pertinent events   7/16 - Presented to [facility] ED for EtOH withdrawal and concern for DTs. Phenobarbital , Precedex  initiated. PCCM consulted for ICU admission. 7/18 on Precedex , did require bolus of phenobarb 7/20-on Precedex  tapering doses of phenobarb 7/21 still on dex  7/22 still on 1.5 mcg/kg/min; added seroquel  and guanfacine  for delirium focus and in effort to get off dex. Also changed valium  to tid and stopped prn benzos w/ goal to taper. Added hs melatonin . Interim History / Subjective:  No complaints still confused but cooperative Objective:  Blood pressure 107/78, pulse (!) 56, temperature  98.3 F (36.8 C), temperature source Oral, resp. rate 17, height 5' 10 (1.778 m), weight 89.3 kg, SpO2 100%.        Intake/Output Summary (Last 24 hours) at 12/17/2023 0739 Last data filed at 12/17/2023 0717 Gross per 24 hour  Intake 1482.91 ml  Output 3750 ml  Net -2267.09 ml   Filed Weights   12/15/23 0139 12/16/23 0400 12/17/23 0500  Weight: 83.3 kg 88.1 kg 89.3 kg   Physical Examination: General this is a 58 year old male. He is laying in bed. No distress today. Still confused at times.  Neuro awake sp slurred but on high dose dex. Oriented x 2. Moves all ext. No tremor. No hyperexcitability  Pulm some scattered rhonchi. No accessory use on room air Card rrr Abd soft  Ext warm  Gu voids.  Resolved Hospital Problem List:    Assessment & Plan:   EtOH withdrawal with delirium tremens complicated by Non-alcoholic related delerium (NARDs) w/ underlying h/o anxiety and depression as well as chronic pain  -has been on phenobarb since 19th; but really did not get anywhere near what would be treatment dose as also co-treated w/ benzos. He is now day 7, doubt ETOH is the driving factor now.  He has completed pheno taper Plan Will add seroquel  50mg  bid Cont IV thiamine  and multivit Will start guanfacine , then cut dex dosing ceiling in half Start benzo taper. He has gotten multiple doses overnight still. I am concerned the benzos may be contributing to his delirium at this point. Will change valium  to TID. Prob change to  q 12 7/23 depending on where we are in clinical course  Adding seroquel   Add melatonin at HS Focus on delirium precautions. Needs to be OOB during day. Up in chair for meals. Out of room w/ staff if able      Best Practice: (right click and Reselect all SmartList Selections daily)   Diet/type: Regular consistency (see orders) DVT prophylaxis: SCDs, SQH GI prophylaxis: PPI Lines: N/A Foley:  N/A Code Status:  full code Last date of multidisciplinary goals  of care discussion [Pending]  My critical care time 45 minutes

## 2023-12-17 NOTE — Plan of Care (Signed)
  Problem: Clinical Measurements: Goal: Respiratory complications will improve Outcome: Progressing   Problem: Activity: Goal: Risk for activity intolerance will decrease Outcome: Progressing   Problem: Nutrition: Goal: Adequate nutrition will be maintained Outcome: Progressing   Problem: Coping: Goal: Level of anxiety will decrease Outcome: Progressing   Problem: Safety: Goal: Ability to remain free from injury will improve Outcome: Progressing   Problem: Safety: Goal: Non-violent Restraint(s) Outcome: Completed/Met

## 2023-12-18 DIAGNOSIS — F10931 Alcohol use, unspecified with withdrawal delirium: Secondary | ICD-10-CM | POA: Diagnosis not present

## 2023-12-18 LAB — CBC
HCT: 50.4 % (ref 39.0–52.0)
Hemoglobin: 15.6 g/dL (ref 13.0–17.0)
MCH: 29.5 pg (ref 26.0–34.0)
MCHC: 31 g/dL (ref 30.0–36.0)
MCV: 95.3 fL (ref 80.0–100.0)
Platelets: 179 K/uL (ref 150–400)
RBC: 5.29 MIL/uL (ref 4.22–5.81)
RDW: 13 % (ref 11.5–15.5)
WBC: 5.2 K/uL (ref 4.0–10.5)
nRBC: 0 % (ref 0.0–0.2)

## 2023-12-18 LAB — BASIC METABOLIC PANEL WITH GFR
Anion gap: 8 (ref 5–15)
BUN: 13 mg/dL (ref 6–20)
CO2: 26 mmol/L (ref 22–32)
Calcium: 8.6 mg/dL — ABNORMAL LOW (ref 8.9–10.3)
Chloride: 104 mmol/L (ref 98–111)
Creatinine, Ser: 0.81 mg/dL (ref 0.61–1.24)
GFR, Estimated: >60 mL/min (ref >60)
Glucose, Bld: 86 mg/dL (ref 70–99)
Potassium: 4.4 mmol/L (ref 3.5–5.1)
Sodium: 138 mmol/L (ref 135–145)

## 2023-12-18 LAB — PHOSPHORUS: Phosphorus: 2.8 mg/dL (ref 2.5–4.6)

## 2023-12-18 LAB — MAGNESIUM: Magnesium: 2.3 mg/dL (ref 1.7–2.4)

## 2023-12-18 MED ORDER — METHOCARBAMOL 500 MG PO TABS: 500.0000 mg | ORAL_TABLET | Freq: Three times a day (TID) | ORAL | Status: DC | PRN

## 2023-12-18 MED ORDER — CLONIDINE HCL 0.1 MG PO TABS: 0.1000 mg | ORAL_TABLET | Freq: Every day | ORAL | Status: DC

## 2023-12-18 MED ORDER — DICYCLOMINE HCL 20 MG PO TABS
20.0000 mg | ORAL_TABLET | Freq: Four times a day (QID) | ORAL | Status: DC | PRN
Start: 2023-12-18 — End: 2023-12-21

## 2023-12-18 MED ORDER — CLONIDINE HCL 0.1 MG PO TABS
0.1000 mg | ORAL_TABLET | Freq: Four times a day (QID) | ORAL | Status: AC
  Administered 2023-12-18 – 2023-12-20 (×8): 0.1 mg via ORAL
  Filled 2023-12-18 (×8): qty 1

## 2023-12-18 MED ORDER — CLONIDINE HCL 0.1 MG PO TABS
0.1000 mg | ORAL_TABLET | ORAL | Status: DC
  Administered 2023-12-20 – 2023-12-21 (×2): 0.1 mg via ORAL
  Filled 2023-12-18 (×2): qty 1

## 2023-12-18 MED ORDER — HYDROXYZINE HCL 25 MG PO TABS
25.0000 mg | ORAL_TABLET | Freq: Four times a day (QID) | ORAL | Status: DC | PRN
  Administered 2023-12-18 – 2023-12-20 (×3): 25 mg via ORAL
  Filled 2023-12-18 (×3): qty 1

## 2023-12-18 MED ORDER — NAPROXEN 250 MG PO TABS
500.0000 mg | ORAL_TABLET | Freq: Two times a day (BID) | ORAL | Status: DC | PRN
Start: 2023-12-18 — End: 2023-12-20

## 2023-12-18 MED ORDER — LOPERAMIDE HCL 2 MG PO CAPS: 2.0000 mg | ORAL_CAPSULE | ORAL | Status: DC | PRN

## 2023-12-18 MED ORDER — ONDANSETRON 4 MG PO TBDP: 4.0000 mg | ORAL_TABLET | Freq: Four times a day (QID) | ORAL | Status: DC | PRN

## 2023-12-18 NOTE — Progress Notes (Addendum)
 Was doing well this am.   Mid-morning although mental status not much different he did get a little more tremulous and tachycardic. Began to become a little more flushed and diaphoretic as well. We had stopped his precedex  earlier after cutting it down from 1.5 to 0.6 yesterday. I had hoped that covering with guanfacine  would adequately cover stopping dex but it doesn't have the broad spectrum alpha blockade that both dex and clonidine  have so suspect we were looking at wd from dex. I discussed this plan w/ pharmacy  Of note in addition to above patient is however much more appropriate and engaging.  We discussed at length his history with alcoholism. He has been to numerous in-patient settings, some of which were more for general psychiatric population and he felt did not really help with his needs, also has been a member of AA, and has been in fellow ship hall. He tells me I've got all the tools, I just don't use them. Even now several days into the hospitalization he tells me he has a desire to drink again when he is home. He reports that In past he does ok until his medicine for anxiety and depression  are discontinued but then needs something to help with his anxiety and depression so he turns to alcohol .    Plan Will give one more dose of the Guanfacine  in morning to avoid rebound hypertension but in mean time we have already added clonidine  taper. I have re-assessed him since starting the taper and although he continues to have some tremor his tachycardia and other symptoms have subsided .  Have asked psych to see him in patient. Seems like a plan for out patient treatment of depression/anxiety may be of benefit and he is also hoping that perhaps follow up with titration of this type of therapy might help him with his alcohol  dependence   Maude FORBES Banner ACNP-BC [facility] Pulmonary/Critical Care Pager # [phone] OR # [phone] if no answer

## 2023-12-18 NOTE — Plan of Care (Signed)
 Patients CIWA score is 11, Atarax  given for his anxiety, patient states he is more anxious today, patient is currently withdrawing from Precedex , tremors noted in all extremities, plan of care and goals discussed with patient with time given for questions, however patient very anxious and easily agitated when he is unable to have his way. Seizure and fall precautions in place as per orders and protocol, bed alarm on, side rails up, call bell in reach and bed in the lowest locked position with mats on the floor and fall precautions socks on, also door left open and patient near nursing station.  Problem: Education: Goal: Knowledge of General Education information will improve Description: Including pain rating scale, medication(s)/side effects and non-pharmacologic comfort measures Outcome: Progressing   Problem: Health Behavior/Discharge Planning: Goal: Ability to manage health-related needs will improve Outcome: Progressing   Problem: Clinical Measurements: Goal: Ability to maintain clinical measurements within normal limits will improve Outcome: Progressing Goal: Will remain free from infection Outcome: Progressing Goal: Diagnostic test results will improve Outcome: Progressing Goal: Respiratory complications will improve Outcome: Progressing Goal: Cardiovascular complication will be avoided Outcome: Progressing   Problem: Activity: Goal: Risk for activity intolerance will decrease Outcome: Progressing   Problem: Nutrition: Goal: Adequate nutrition will be maintained Outcome: Progressing   Problem: Coping: Goal: Level of anxiety will decrease Outcome: Progressing   Problem: Elimination: Goal: Will not experience complications related to bowel motility Outcome: Progressing Goal: Will not experience complications related to urinary retention Outcome: Progressing   Problem: Pain Managment: Goal: General experience of comfort will improve and/or be controlled Outcome:  Progressing   Problem: Safety: Goal: Ability to remain free from injury will improve Outcome: Progressing   Problem: Skin Integrity: Goal: Risk for impaired skin integrity will decrease Outcome: Progressing

## 2023-12-18 NOTE — Progress Notes (Signed)
 NAME:  Jermaine [name], MRN:  [id number], DOB:  27-Oct-1965, LOS: 7 ADMISSION DATE:  12/10/2023 CONSULTATION DATE:  12/11/2023 REFERRING MD:  Garrick - EDP, CHIEF COMPLAINT:  EtOH withdrawal   History of Present Illness:  58 year old man who presented to [facility] ED 7/16 with EtOH withdrawal, concern for DTs. PMHx significant for EtOH abuse (1 bottle of vodka daily) with history of withdrawal/DTs, anixety/depression, obesity. Recent admission 3/2 - 3/7 for EtOH withdrawal.  Patient presented to ED with complaint of feeling like he was going into DTs. Last drink ~5 hours PTA. On arrival, patient was afebrile with HR 118, RR 22, BP 137/93, SpO2 93% on RA. Labs were notable for WBC 13.3, Hgb 18.1 (suspect concentrated, baseline ~14), Plt 364. Na 138, K 4.3, CO2 22, BUN/Cr 12/1.01, phos 3.2, Mg 2.3, AST mildly elevated to 43, LFTs otherwise WNL. Ethanol 370.   PCCM consulted for ICU admission for delirium tremens and Precedex  initiation.  On admission, patient denies fever/chills, CP/SOB, n/v/d. No recent symptoms of illness. Endorses back pain. Denies edema.  Pertinent Medical History:   Past Medical History:  Diagnosis Date   Alcohol  abuse    Anxiety    Class 1 obesity 09/14/2021   Depression    Significant Hospital Events: Including procedures, antibiotic start and stop dates in addition to other pertinent events   7/16 - Presented to [facility] ED for EtOH withdrawal and concern for DTs. Phenobarbital , Precedex  initiated. PCCM consulted for ICU admission. 7/18 on Precedex , did require bolus of phenobarb 7/20-on Precedex  tapering doses of phenobarb 7/21 still on dex  7/22 was still on Precedex  1.5 mcg/kg/min; added guanfacine , changed Precedex  to 0.6.  Added Seroquel  twice daily.  Also changed valium  to tid and stopped prn benzos w/ goal to taper. Added hs melatonin 7/23 stopped Precedex .  Valium  taper placed.  Impulsivity improving.  Cooperative. . Interim History / Subjective:  Slept well last  night no distress Objective:  Blood pressure 120/73, pulse (!) 54, temperature (!) 96.8 F (36 C), temperature source Axillary, resp. rate 16, height 5' 10 (1.778 m), weight 83.7 kg, SpO2 94%.        Intake/Output Summary (Last 24 hours) at 12/18/2023 9192 Last data filed at 12/18/2023 0600 Gross per 24 hour  Intake 1772.8 ml  Output 6450 ml  Net -4677.2 ml   Filed Weights   12/16/23 0400 12/17/23 0500 12/18/23 0500  Weight: 88.1 kg 89.3 kg 83.7 kg   Physical Examination: General cooperative 58 year old male patient lying in bed no acute distress this morning HEENT normocephalic atraumatic no jugular venous distention appreciated Pulmonary: Clear to auscultation currently room air no accessory use Cardiac: Regular rate and rhythm without murmur rub or gallop Abdomen: Soft nontender no organomegaly Extremities: Warm dry brisk cap refill Neuro: Awake, oriented x 3 today, still speech is a little slurred but better than yesterday moves all extremities.  Impulsivity seems to have improved some Resolved Hospital Problem List:    Assessment & Plan:   EtOH withdrawal with delirium tremens complicated by Non-alcoholic related delerium (NARDs) w/ underlying h/o anxiety and depression as well as chronic pain  -had been on phenobarb since 19th; but really did not get anywhere near what would be treatment dose as also co-treated w/ benzos. He is now day 8, doubt ETOH is the driving factor now He has completed pheno taper, and has been on over a week of benzodiazepines as well, and we started really focusing on his delirium management on the 22nd.  He  looks much better today reports he slept well last night Plan Continue twice daily Seroquel   cont IV thiamine  and multivit Will continue guanfacine  at 2 mg twice daily, if behavior stable x 24 hours would begin tapering this first first at 1 mg twice daily for 48 hours, then 1 mg nightly for 48 hours then discontinue After guanfacine  wean off if  behavior remains stable would then change Seroquel  to nightly, and assess for discontinuation Continue Valium  taper as outlined, because he is required such heavy dosing of benzodiazepine in the past we are opting for a slow taper hoping this may help reduce risk of him drinking again once he is out of the hospital Continue a night melatonin Continue to ambulate him during the day, if we can get him outside today I think that would be helpful for him Continue delirium precautions I think he would benefit from inpatient drug/ETOH rehabilitation after this if that could be an option, I am not sure what is available we will ask case management to assist      Best Practice: (right click and Reselect all SmartList Selections daily)   Diet/type: Regular consistency (see orders) DVT prophylaxis: SCDs, SQH GI prophylaxis: PPI Lines: N/A Foley:  N/A Code Status:  full code Last date of multidisciplinary goals of care discussion [Pending]  My time 34 min

## 2023-12-19 DIAGNOSIS — F10931 Alcohol use, unspecified with withdrawal delirium: Secondary | ICD-10-CM | POA: Diagnosis not present

## 2023-12-19 MED ORDER — GUANFACINE HCL 1 MG PO TABS
1.0000 mg | ORAL_TABLET | Freq: Once | ORAL | Status: AC
  Administered 2023-12-19: 1 mg via ORAL
  Filled 2023-12-19: qty 1

## 2023-12-19 MED ORDER — POLYVINYL ALCOHOL 1.4 % OP SOLN
1.0000 [drp] | OPHTHALMIC | Status: DC | PRN
  Filled 2023-12-19: qty 15

## 2023-12-19 NOTE — Hospital Course (Signed)
 Patient is a 58 years old male with past medical history of alcoholism with severe alcohol  withdrawal syndrome including delirium tremens, alcohol  withdrawal seizures, anxiety and depression was admitted to ICU initially with with confusion disorientation hallucinations and severe alcohol  withdrawal.  He was initially on Precedex  and was on phenobarbital  taper.  He was able to come off Precedex  on 12/18/2023.  Patient was then considered stable for transfer out of the ICU.  Sequence of events 7/16 - Presented to [facility] ED for EtOH withdrawal and concern for DTs. Phenobarbital , Precedex  initiated. PCCM consulted for ICU admission. 7/22 was still on Precedex  1.5 mcg/kg/min; added guanfacine , changed Precedex  to 0.6.  Added Seroquel  twice daily.  Also changed valium  to tid and stopped prn benzos w/ goal to taper. Added hs melatonin 7/23 stopped Precedex .  Valium  taper placed.  Impulsivity improving.  Cooperative.  EtOH withdrawal with delirium tremens Status post Precedex  drip.  Also received phenobarbital  taper and benzodiazepines.  Has completed phenobarbital  taper at this time and has been on benzodiazepine for over a week.  Continue on Seroquel  if behavior remains stable would then change Seroquel  to nightly, and assess for discontinuation.  On clonidine  taper as well. Continue slow Valium  taper.  Melatonin for sleep.  Continue delirium precautions I think he would benefit from inpatient drug/ETOH rehabilitation, TOC to assist.   anxiety and depression   chronic pain

## 2023-12-19 NOTE — Plan of Care (Signed)
  Problem: Activity: Goal: Risk for activity intolerance will decrease Outcome: Progressing   Problem: Nutrition: Goal: Adequate nutrition will be maintained Outcome: Progressing   Problem: Coping: Goal: Level of anxiety will decrease Outcome: Progressing   Problem: Pain Managment: Goal: General experience of comfort will improve and/or be controlled Outcome: Progressing   Problem: Skin Integrity: Goal: Risk for impaired skin integrity will decrease Outcome: Progressing

## 2023-12-19 NOTE — Progress Notes (Signed)
 PROGRESS NOTE  Jermaine [name] FMW:[id number] DOB: [birth year] DOA: 12/10/2023 PCP: Patient, No Pcp Per   LOS: 8 days   Brief narrative:  Patient is a 58 years old male with past medical history of alcoholism with severe alcohol  withdrawal syndrome including delirium tremens, alcohol  withdrawal seizures, anxiety and depression was admitted to ICU initially with with confusion disorientation hallucinations and severe alcohol  withdrawal.  He was initially on Precedex  and was on phenobarbital  taper.  He was able to come off Precedex  on 12/18/2023.  Patient was then considered stable for transfer out of the ICU.  Sequence of events 7/16 - Presented to [facility] ED for EtOH withdrawal and concern for DTs. Phenobarbital , Precedex  initiated. PCCM consulted for ICU admission. 7/22 was still on Precedex  1.5 mcg/kg/min; added guanfacine , changed Precedex  to 0.6.  Added Seroquel  twice daily.  Also changed valium  to tid and stopped prn benzos w/ goal to taper. Added hs melatonin 7/23 stopped Precedex .  Valium  taper placed.  Impulsivity improving.  Cooperative.   Assessment/Plan: Principal Problem:   Alcohol  withdrawal (HCC)  EtOH withdrawal with delirium tremens Status post Precedex  drip.  Has completed phenobarbital  taper at this time. On valium  taper and Clonidine .  On Seroquel ,, if behavior remains stable would then change Seroquel  to nightly, and assess for discontinuation. On Continue on Seroquel  if behavior remains stable would then change Seroquel  to nightly, and assess for discontinuation.  Continue Melatonin for sleep.  Continue delirium precautions.  Patient is interested in talking to psychiatry in the quitting alcohol  altogether.  Psychiatry has seen the patient today.  Patient had been started on buspirone and mirtazapine .  TOC has been consulted for coordination of care.  Continue thiamine  folic acid  multivitamins.   anxiety and depression  Continue mirtazapine  and buspirone.  Psychiatry has seen the  patient.  DVT prophylaxis: heparin  injection 5,000 Units Start: 12/11/23 1140   Disposition: Likely home in 2 to 3 days.  Status is: Inpatient Remains inpatient appropriate because: Severe alcohol  withdrawal on clonidine , Valium  taper, pending clinical improvement    Code Status:     Code Status: Full Code  Family Communication: None at bedside  Consultants: PCCM Psychiatry  Procedures: None  Anti-infectives:  None  Anti-infectives (From admission, onward)    None        Subjective: Today, patient was seen and examined at bedside.  Patient states that he feels better today.  Denies any hallucinations or sweating diaphoresis but blood pressure is elevated.  Communicated well and wishes to improve,  Objective: Vitals:   12/19/23 0700 12/19/23 0800  BP:    Pulse: 73 93  Resp: 18 16  Temp: 97.8 F (36.6 C)   SpO2: 100% 98%    Intake/Output Summary (Last 24 hours) at 12/19/2023 1008 Last data filed at 12/19/2023 0911 Gross per 24 hour  Intake 1760 ml  Output 6150 ml  Net -4390 ml   Filed Weights   12/17/23 0500 12/18/23 0500 12/19/23 0600  Weight: 89.3 kg 83.7 kg 88.1 kg   Body mass index is 27.87 kg/m.   Physical Exam:  GENERAL: Patient is alert awake and oriented. Not in obvious distress.  Overweight. HENT: No scleral pallor or icterus. Pupils equally reactive to light. Oral mucosa is moist NECK: is supple, no gross swelling noted. CHEST: Clear to auscultation. No crackles or wheezes.  Diminished breath sounds bilaterally. CVS: S1 and S2 heard, no murmur. Regular rate and rhythm.  ABDOMEN: Soft, non-tender, bowel sounds are present. EXTREMITIES: No edema. CNS: Cranial nerves  are intact. No focal motor deficits. SKIN: warm and dry without rashes.  Data Review: I have personally reviewed the following laboratory data and studies,  CBC: Recent Labs  Lab 12/14/23 1048 12/15/23 0705 12/16/23 0332 12/17/23 1343 12/18/23 0703  WBC 9.6 11.5* 9.7  7.6 5.2  HGB 16.9 16.3 15.5 14.7 15.6  HCT 53.0* 50.8 46.9 47.6 50.4  MCV 91.7 90.6 91.2 95.2 95.3  PLT 213 202 197 189 179   Basic Metabolic Panel: Recent Labs  Lab 12/14/23 0843 12/15/23 0705 12/16/23 0332 12/17/23 1116 12/18/23 0703  NA 133* 136 136 137 138  K 4.4 4.0 3.8 4.0 4.4  CL 96* 99 99 102 104  CO2 24 27 28 22 26   GLUCOSE 95 99 104* 125* 86  BUN 13 18 17 14 13   CREATININE 1.07 1.12 1.23 1.07 0.81  CALCIUM  8.7* 8.6* 8.8* 9.0 8.6*  MG 2.1 2.3 2.3 2.0 2.3  PHOS 2.9 3.8 4.0 2.5 2.8   Liver Function Tests: No results for input(s): AST, ALT, ALKPHOS, BILITOT, PROT, ALBUMIN in the last 168 hours. No results for input(s): LIPASE, AMYLASE in the last 168 hours. No results for input(s): AMMONIA in the last 168 hours. Cardiac Enzymes: No results for input(s): CKTOTAL, CKMB, CKMBINDEX, TROPONINI in the last 168 hours. BNP (last 3 results) No results for input(s): BNP in the last 8760 hours.  ProBNP (last 3 results) No results for input(s): PROBNP in the last 8760 hours.  CBG: No results for input(s): GLUCAP in the last 168 hours. Recent Results (from the past 240 hours)  MRSA Next Gen by PCR, Nasal     Status: None   Collection Time: 12/11/23  2:06 PM   Specimen: Nasal Mucosa; Nasal Swab  Result Value Ref Range Status   MRSA by PCR Next Gen NOT DETECTED NOT DETECTED Final    Comment: (NOTE) The GeneXpert MRSA Assay (FDA approved for NASAL specimens only), is one component of a comprehensive MRSA colonization surveillance program. It is not intended to diagnose MRSA infection nor to guide or monitor treatment for MRSA infections. Test performance is not FDA approved in patients less than 45 years old. Performed at [facility], [address] [place], [place] [address]      Studies: No results found.    Vernal Alstrom, MD  [facility] 12/19/2023  If 7PM-7AM, please contact night-coverage

## 2023-12-19 NOTE — Consult Note (Signed)
  Psychiatry Consult Note  Psychiatry was consulted for alcohol  withdrawal complicated by delirium. The patient initially required antipsychotic medications, which improved his symptoms. He subsequently experienced withdrawal following abrupt discontinuation of Precedex  but is now on an appropriate detoxification regimen.  The patient reports using alcohol  to self-medicate when he runs out of prescribed medications for anxiety and depression. He is motivated to pursue rehabilitation, expressing preference for inpatient services over outpatient options. Given that this is his fifth medical admission for alcohol  withdrawal complications, including delirium tremens, inpatient rehabilitation with a door-to-door transfer is strongly recommended to reduce the risk of recurrent admissions and ongoing alcohol -related harm.  Psychiatric recommendations: -Start mirtazapine  15mg  po at bedtime for anixety and depression( previous home medication).   Buspirone 10 mg PO twice daily (non-benzodiazepine anxiolytic, safe in patients with substance use disorders)  Avoid benzodiazepines or other addictive medications given history of alcohol  use disorder.  Rehabilitation Planning - Strongly recommend coordination with Transitions of Care for inpatient rehabilitation placement. The patient should begin contacting rehabilitation facilities prior to discharge to secure a bed. If immediate inpatient placement is unavailable, he may present to the [facility] for continued support while awaiting inpatient rehabilitation admission.  Treatment Recommendations:  CIWA-Ar Symptom-Triggered Protocol: Transition to a benzodiazepine-based, vital signs- and symptom-triggered CIWA-Ar ([facility] Withdrawal Assessment for Alcohol , Revised) protocol to more accurately titrate treatment to withdrawal severity. This approach has demonstrated superior outcomes compared to fixed-dose tapers, especially  for patients with fluctuating symptoms.  Adjunctive Agents: Continue clonidine  for autonomic symptoms but avoid using alpha-2 agonists as sole therapy for alcohol  withdrawal, as they do not prevent seizures or delirium tremens.   Thiamine  Supplementation: consider high-dose IV thiamine  500 mg IV three times daily for 3-5 days, then transition to oral thiamine  100 mg daily to prevent Wernicke's encephalopathy and related complications.  Additional Vitamin Support: Continue folate and multivitamin supplementation. Consider magnesium  replacement if levels are low, as hypomagnesemia may worsen tremors and seizure risk.  Monitoring: Maintain frequent vital signs monitoring, monitor for signs of worsening withdrawal (autonomic instability, hallucinations, seizures), and reassess daily for readiness to taper benzodiazepines.   -Consult TOC -Patient to re-engage with inpatient rehab facilities preferably door to door transfer   There are no acute psychiatric safety concerns at this time. Psychiatry has communicated with the primary team and recommends connecting the patient with Transitions of Care for rehabilitation planning and medication management continuity.   Psychiatry consult service will sign off but remains available for additional questions or concerns.

## 2023-12-20 DIAGNOSIS — F10931 Alcohol use, unspecified with withdrawal delirium: Secondary | ICD-10-CM | POA: Diagnosis not present

## 2023-12-20 MED ORDER — ENOXAPARIN SODIUM 40 MG/0.4ML IJ SOSY
40.0000 mg | PREFILLED_SYRINGE | INTRAMUSCULAR | Status: DC
  Administered 2023-12-20: 40 mg via SUBCUTANEOUS
  Filled 2023-12-20: qty 0.4

## 2023-12-20 MED ORDER — QUETIAPINE FUMARATE 25 MG PO TABS
50.0000 mg | ORAL_TABLET | Freq: Once | ORAL | Status: AC
  Administered 2023-12-20: 50 mg via ORAL
  Filled 2023-12-20: qty 2

## 2023-12-20 MED ORDER — QUETIAPINE FUMARATE 25 MG PO TABS: 50.0000 mg | ORAL_TABLET | Freq: Every day | ORAL | Status: DC

## 2023-12-20 NOTE — TOC Progression Note (Signed)
 Transition of Care [facility]) - Progression Note    Patient Details  Name: Jermaine [name] MRN: [id number] Date of Birth: [birth year]  Transition [facility] CM/SW Contact  Doneta Glenys DASEN, RN Phone Number: 12/20/2023, 2:12 PM  Clinical Narrative:    CM spoke with patient in the room to discuss resources for residential rehab. Patient states not interested politely. Discussed some differences between therapist and psychologist. Patient did accept hard copy of resources. Encourage patient to continue to tap into resources he thinks works for him.   Expected Discharge Plan: Home/Self Care Barriers to Discharge: Continued Medical Work up, Requiring sitter/restraints               Expected Discharge Plan and Services In-house Referral: Clinical Social Work Discharge Planning Services: CM Consult Post Acute Care Choice: NA Living arrangements for the past 2 months: Single Family Home                 DME Arranged: N/A DME Agency: NA       HH Arranged: NA HH Agency: NA         Social Drivers of Health (SDOH) Interventions SDOH Screenings   Food Insecurity: Patient Declined (07/28/2023)  Housing: Low Risk  (12/11/2023)  Transportation Needs: No Transportation Needs (12/11/2023)  Utilities: Not At Risk (12/11/2023)  Tobacco Use: Low Risk  (12/10/2023)    Readmission Risk Interventions    12/13/2023   12:29 PM 07/29/2023    3:07 PM 05/13/2023    2:46 PM  Readmission Risk Prevention Plan  Transportation Screening Complete Complete Complete  PCP or Specialist Appt within 3-5 Days   Complete  HRI or Home Care Consult   Complete  Social Work Consult for Recovery Care Planning/Counseling   Complete  Palliative Care Screening   Not Applicable  Medication Review Oceanographer) Complete Complete Complete  PCP or Specialist appointment within 3-5 days of discharge Complete    HRI or Home Care Consult Complete Complete   SW Recovery Care/Counseling Consult Complete Complete    Palliative Care Screening Not Applicable Not Applicable   Skilled Nursing Facility Not Applicable Not Applicable

## 2023-12-20 NOTE — Progress Notes (Signed)
 [facility] PROGRESS NOTE  Zayvier Caravello (DOB: [birth year]) FMW:[id number] PCP: Patient, No Pcp Per  Brief Narrative: Mauro Arps is a 58 y.o. male with a history of alcoholism and severe withdrawals/DTs, anxiety and depression who presented to the ED on 12/10/2023 with confusions, hallucinations consistent with severe alcohol  withdrawal, admitted to ICU, given phenobarbital , precedex  infusion. Precedex  was ultimately weaned off on 12/18/2023. Seroquel  was added and valium  taper was started. Clonidine  taper later added. Once he was stable for evaluation, psychiatry was consulted 7/24. Home mirtazapine  was restarted and buspirone was added. He is stable for transfer to the floor 7/25.   Subjective: Feels depressed but has plans for the future. No chest pain or dyspnea, not getting up OOB much. Feels tremors are improving.   Objective: BP 124/86   Pulse 67   Temp 98.6 F (37 C) (Oral)   Resp 16   Ht 5' 10 (1.778 m)   Wt 84.4 kg   SpO2 100%   BMI 26.70 kg/m   Gen: No distress, chronically ill-appearing Pulm: Clear, nonlabored  CV: RRR, no MRG, no edema GI: Soft, NT, ND, +BS  Neuro: Alert and oriented, mildly tremulous with no focal deficits.  Ext: Warm, no deformities. Skin: No rashes, lesions or ulcers on visualized skin   Assessment & Plan: EtOH withdrawal with delirium tremens:  - Completed phenobarbital  taper - Continue valium  and clonidine  taper - Continue seroquel , melatonin.  - Pt opts for outpatient rehabilitation services, TOC consulted. Not under IVC. - Continue thiamine  folic acid  multivitamins. - Monitor BP, may need more rapid taper of clonidine .  - Stable for transfer to med-surg, d/w RN.  - Taper seroquel  to qHS starting 7/25   Anxiety and depression: - Continue mirtazapine  and buspirone.  - Psychiatry consultation 7/24, continue outpatient follow up     Bernardino KATHEE Come, MD [facility] [email or web address] 12/20/2023, 7:59 AM

## 2023-12-20 NOTE — Plan of Care (Signed)

## 2023-12-20 NOTE — Plan of Care (Signed)
   Problem: Activity: Goal: Risk for activity intolerance will decrease Outcome: Progressing   Problem: Nutrition: Goal: Adequate nutrition will be maintained Outcome: Progressing   Problem: Coping: Goal: Level of anxiety will decrease Outcome: Progressing

## 2023-12-21 ENCOUNTER — Other Ambulatory Visit (HOSPITAL_COMMUNITY): Payer: Self-pay

## 2023-12-21 DIAGNOSIS — F419 Anxiety disorder, unspecified: Secondary | ICD-10-CM

## 2023-12-21 DIAGNOSIS — F32A Depression, unspecified: Secondary | ICD-10-CM | POA: Diagnosis not present

## 2023-12-21 DIAGNOSIS — F10931 Alcohol use, unspecified with withdrawal delirium: Secondary | ICD-10-CM | POA: Diagnosis not present

## 2023-12-21 MED ORDER — MIRTAZAPINE 30 MG PO TABS
30.0000 mg | ORAL_TABLET | Freq: Every day | ORAL | 0 refills | Status: DC
  Filled 2023-12-21: qty 30, 30d supply, fill #0

## 2023-12-21 MED ORDER — PANTOPRAZOLE SODIUM 40 MG PO TBEC
40.0000 mg | DELAYED_RELEASE_TABLET | Freq: Every day | ORAL | 0 refills | Status: DC
  Filled 2023-12-21: qty 30, 30d supply, fill #0

## 2023-12-21 MED ORDER — CHLORDIAZEPOXIDE HCL 25 MG PO CAPS
25.0000 mg | ORAL_CAPSULE | Freq: Three times a day (TID) | ORAL | 0 refills | Status: DC | PRN
  Filled 2023-12-21: qty 30, 10d supply, fill #0

## 2023-12-21 MED ORDER — LOPERAMIDE HCL 2 MG PO TABS
2.0000 mg | ORAL_TABLET | ORAL | 0 refills | Status: DC | PRN
  Filled 2023-12-21: qty 30, 4d supply, fill #0
  Filled 2023-12-21: qty 30, 2d supply, fill #0

## 2023-12-21 MED ORDER — BUSPIRONE HCL 5 MG PO TABS
5.0000 mg | ORAL_TABLET | Freq: Three times a day (TID) | ORAL | 0 refills | Status: DC
  Filled 2023-12-21: qty 90, 30d supply, fill #0

## 2023-12-21 MED ORDER — CLONIDINE HCL 0.2 MG PO TABS
ORAL_TABLET | ORAL | 0 refills | Status: DC
  Filled 2023-12-21: qty 3, 4d supply, fill #0

## 2023-12-21 MED ORDER — QUETIAPINE FUMARATE 50 MG PO TABS
50.0000 mg | ORAL_TABLET | Freq: Every day | ORAL | 0 refills | Status: DC
  Filled 2023-12-21: qty 30, 30d supply, fill #0

## 2023-12-21 MED ORDER — HYDROXYZINE HCL 25 MG PO TABS
25.0000 mg | ORAL_TABLET | Freq: Four times a day (QID) | ORAL | 0 refills | Status: DC | PRN
  Filled 2023-12-21: qty 30, 8d supply, fill #0

## 2023-12-21 MED ORDER — VITAMIN B-1 100 MG PO TABS
100.0000 mg | ORAL_TABLET | Freq: Every day | ORAL | 0 refills | Status: DC
  Filled 2023-12-21: qty 30, 30d supply, fill #0

## 2023-12-21 MED ORDER — ONDANSETRON 4 MG PO TBDP
4.0000 mg | ORAL_TABLET | Freq: Four times a day (QID) | ORAL | 0 refills | Status: DC | PRN
  Filled 2023-12-21: qty 20, 5d supply, fill #0

## 2023-12-21 MED ORDER — FOLIC ACID 1 MG PO TABS
1.0000 mg | ORAL_TABLET | Freq: Every day | ORAL | 0 refills | Status: DC
  Filled 2023-12-21: qty 30, 30d supply, fill #0

## 2023-12-21 NOTE — Discharge Summary (Signed)
 Physician Discharge Summary   Patient: Jermaine [name] MRN: [id number] DOB: 1965/10/06  Admit date:     12/10/2023  Discharge date: 12/21/23  Discharge Physician: Carliss LELON Canales   PCP: Patient, No Pcp Per   Recommendations at discharge:    Pt to be discharged home.   If you experience worsening fever, chills, chest pain, shortness of breath, or other concerning symptoms, please call your PCP or go to the emergency department immediately.  Discharge Diagnoses: Principal Problem:   Alcohol  withdrawal (HCC)  Resolved Problems:   * No resolved hospital problems. *   Hospital Course:  58 y.o. male with a history of alcoholism and severe withdrawals/DTs, anxiety and depression who presented to the ED on 12/10/2023 with confusions, hallucinations consistent with severe alcohol  withdrawal, admitted to ICU, given phenobarbital , precedex  infusion. Precedex  was ultimately weaned off on 12/18/2023. Seroquel  was added and valium  taper was started. Clonidine  taper later added. Once he was stable for evaluation, psychiatry was consulted 7/24. Home mirtazapine  was restarted and buspirone  was added. He is stable for transfer to the floor 7/25.   Assessment and Plan:  Severe alcohol  withdrawal with DTs - Initially requiring ICU stay with phenobarbital  and Precedex  infusion.  Ultimately weaned off 7/23.  Was initiated on Valium  taper as well as clonidine  taper.  Will transition Valium  to Librium  to be taken as prescribed upon discharge.  Short course of clonidine  to be taken as well.  Encourage patient to pursue AA meetings and support groups.  Patient appears motivated, stated he is going to pour out his alcohol , get in touch with AA, pursue psychiatry/behavioral health in the outpatient setting.  Continue vitamin supplementation as well.  Anxiety depression - Prescription for mirtazapine  and buspirone  on board.  Would continue outpatient psychiatry.    Consultants: Psychiatry, critical care Procedures  performed: None Disposition: Home Diet recommendation:  Discharge Diet Orders (From admission, onward)     Start     Ordered   12/21/23 0000  Diet - low sodium heart healthy        12/21/23 1121           Cardiac diet  DISCHARGE MEDICATION: Allergies as of 12/21/2023       Reactions   Ketamine     Hx of causing fever in 2024    Codeine Itching        Medication List     STOP taking these medications    HYDROcodone-acetaminophen  5-325 MG tablet Commonly known as: NORCO/VICODIN   mirtazapine  30 MG tablet Commonly known as: REMERON    tiZANidine 4 MG tablet Commonly known as: ZANAFLEX       TAKE these medications    chlordiazePOXIDE  25 MG capsule Commonly known as: LIBRIUM  Take 1 capsule (25 mg total) by mouth 3 (three) times daily as needed for anxiety or withdrawal.   cloNIDine  0.1 MG tablet Commonly known as: CATAPRES  Take 1 tablet (0.1 mg total) by mouth 2 (two) times daily in the am and at bedtime. for 2 days, THEN 1 tablet (0.1 mg total) daily before breakfast for 2 days. Start taking on: December 21, 2023   folic acid  1 MG tablet Commonly known as: FOLVITE  Take 1 tablet (1 mg total) by mouth daily. Start taking on: December 22, 2023   hydrOXYzine  25 MG tablet Commonly known as: ATARAX  Take 1 tablet (25 mg total) by mouth every 6 (six) hours as needed for anxiety. What changed:  how much to take when to take this   loperamide  2  MG capsule Commonly known as: IMODIUM  Take 1-2 capsules (2-4 mg total) by mouth as needed for diarrhea or loose stools (diarrhea).   ondansetron  4 MG disintegrating tablet Commonly known as: ZOFRAN -ODT Take 1 tablet (4 mg total) by mouth every 6 (six) hours as needed for nausea or vomiting.   pantoprazole  40 MG tablet Commonly known as: PROTONIX  Take 1 tablet (40 mg total) by mouth daily at 12 noon. What changed: when to take this   QUEtiapine  50 MG tablet Commonly known as: SEROQUEL  Take 1 tablet (50 mg total) by mouth  at bedtime.   thiamine  100 MG tablet Commonly known as: Vitamin B-1 Take 1 tablet (100 mg total) by mouth daily. Start taking on: December 22, 2023         Discharge Exam: Fredricka Weights   12/19/23 0600 12/20/23 0500 12/21/23 0500  Weight: 88.1 kg 84.4 kg 84.4 kg    GENERAL:  Alert, pleasant, no acute distress  HEENT:  EOMI CARDIOVASCULAR:  RRR, no murmurs appreciated RESPIRATORY:  Clear to auscultation, no wheezing, rales, or rhonchi GASTROINTESTINAL:  Soft, nontender, nondistended EXTREMITIES:  No LE edema bilaterally NEURO:  No new focal deficits appreciated SKIN:  No rashes noted PSYCH:  Appropriate mood and affect, anxious    Condition at discharge: improving  The results of significant diagnostics from this hospitalization (including imaging, microbiology, ancillary and laboratory) are listed below for reference.   Imaging Studies: No results found.  Microbiology: Results for orders placed or performed during the hospital encounter of 12/10/23  MRSA Next Gen by PCR, Nasal     Status: None   Collection Time: 12/11/23  2:06 PM   Specimen: Nasal Mucosa; Nasal Swab  Result Value Ref Range Status   MRSA by PCR Next Gen NOT DETECTED NOT DETECTED Final    Comment: (NOTE) The GeneXpert MRSA Assay (FDA approved for NASAL specimens only), is one component of a comprehensive MRSA colonization surveillance program. It is not intended to diagnose MRSA infection nor to guide or monitor treatment for MRSA infections. Test performance is not FDA approved in patients less than 50 years old. Performed at [facility], [address], [place], [place] [address]     Labs: CBC: Recent Labs  Lab 12/15/23 [phone] 12/16/23 0332 12/17/23 1343 12/18/23 0703  WBC 11.5* 9.7 7.6 5.2  HGB 16.3 15.5 14.7 15.6  HCT 50.8 46.9 47.6 50.4  MCV 90.6 91.2 95.2 95.3  PLT 202 197 189 179   Basic Metabolic Panel: Recent Labs  Lab 12/15/23 0705 12/16/23 0332  12/17/23 1116 12/18/23 0703  NA 136 136 137 138  K 4.0 3.8 4.0 4.4  CL 99 99 102 104  CO2 27 28 22 26   GLUCOSE 99 104* 125* 86  BUN 18 17 14 13   CREATININE 1.12 1.23 1.07 0.81  CALCIUM  8.6* 8.8* 9.0 8.6*  MG 2.3 2.3 2.0 2.3  PHOS 3.8 4.0 2.5 2.8   Liver Function Tests: No results for input(s): AST, ALT, ALKPHOS, BILITOT, PROT, ALBUMIN in the last 168 hours. CBG: No results for input(s): GLUCAP in the last 168 hours.  Discharge time spent: 31 minutes.  Length of inpatient stay: 10 days  Signed: Carliss LELON Canales, DO [facility] 12/21/2023

## 2023-12-21 NOTE — Progress Notes (Signed)
 Medications were delivered to the patient from [facility] outpatient pharmacy.

## 2023-12-21 NOTE — Progress Notes (Addendum)
 The patient was admitted to 5W rm 1522 on 12/20/2023 around 0900. Today, 12/21/2023, prior to discharge the patient stated he cannot locate his money clip with his credit cards attached. Per patient, no money was inside the money clip, just his credit cards. However, he is unsure if he brought it to the hospital with him. Patient belonging documentation did not mention a money clip with credit cards. Patient was told to call the hospital once he gets home if unable to locate his money clip. 5w unit and room searched, unable to locate.   The patient called back at 1343. Per the patient, he does not have his money clip. He searched around his home and cannot locate it.   Security notified by the Clinical research associate at 1447 to ask if they received a money clip with credit cards in it. The Clinical research associate spoke to [place] in security. Per Alyce, nothing was logged in for this patient.   ICU notified regarding lost money clip with credit cards in it. The Clinical research associate spoke with Addison, the Diplomatic Services operational officer. Per Addison, she did not find anything belonging to this patient.  The patient was admitted to the ED 12/10/2022 around 2331. The writer spoke to [employer] at 1410 in the ED. Per Alan, if the patient lost credit cards they would have been sent to security.  Admitted to ED 12/10/2023 around 2331 Transferred to IC 0716/2025 around 1400 Transferred to 5W 12/20/2023 around 0900.

## 2023-12-21 NOTE — Plan of Care (Signed)
  Problem: Clinical Measurements: Goal: Diagnostic test results will improve Outcome: Progressing   Problem: Coping: Goal: Level of anxiety will decrease Outcome: Progressing   

## 2023-12-23 ENCOUNTER — Telehealth (HOSPITAL_COMMUNITY): Payer: Self-pay | Admitting: Licensed Clinical Social Worker

## 2023-12-23 NOTE — Telephone Encounter (Signed)
 The therapist receives a voicemail from Jermaine [name] saying that he was referred to this therapist by a friend. The therapist returns his call and leaves a HIPAA-compliant voicemail with his direct contact number.  Zell Maier, MA, LCSW, [facility], LCAS 12/23/2023

## 2023-12-25 ENCOUNTER — Telehealth (HOSPITAL_COMMUNITY): Payer: Self-pay | Admitting: Licensed Clinical Social Worker

## 2023-12-25 NOTE — Telephone Encounter (Signed)
 The therapist returns Jermaine [name]'s call confirming his identity via two identifiers. [name] says that he is a recovering alcoholic and just got out of the hospital. [name] says that he has never had a therapist and says that he is not going to open up in a room full of people at an AA meeting.  He says that he was sober for three months before his last episode which led to being hospitalized for Dts for 11 days.  The therapist schedules him for a CCA on 01/02/24 at 11 a.m.  Zell Maier, MA, LCSW, [facility], LCAS 12/25/2023

## 2024-01-02 ENCOUNTER — Ambulatory Visit (HOSPITAL_COMMUNITY): Payer: MEDICAID | Admitting: Licensed Clinical Social Worker

## 2024-01-02 DIAGNOSIS — F1020 Alcohol dependence, uncomplicated: Secondary | ICD-10-CM

## 2024-01-02 DIAGNOSIS — F4010 Social phobia, unspecified: Secondary | ICD-10-CM
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
## 2024-01-03 ENCOUNTER — Encounter (HOSPITAL_COMMUNITY): Payer: Self-pay

## 2024-01-09 ENCOUNTER — Ambulatory Visit (HOSPITAL_COMMUNITY): Admitting: Medical

## 2024-01-09 ENCOUNTER — Ambulatory Visit (INDEPENDENT_AMBULATORY_CARE_PROVIDER_SITE_OTHER): Payer: MEDICAID | Admitting: Licensed Clinical Social Worker

## 2024-01-09 DIAGNOSIS — F1020 Alcohol dependence, uncomplicated: Secondary | ICD-10-CM | POA: Diagnosis not present

## 2024-01-09 DIAGNOSIS — F4010 Social phobia, unspecified: Secondary | ICD-10-CM

## 2024-01-09 NOTE — Progress Notes (Signed)
 THERAPIST PROGRESS NOTE  Session Time: 9 a.m. to 10:06 a.m.   Type of Therapy: Individual   Therapist Response/Interventions: CBT/The therapist educates Jermaine [name] on the substances and process addictions he should avoid while addressing irrational beliefs that prevent him from being able to feel like he fits in at meetings  Treatment Goals addressed:  Active     Substance Use     Jermaine [name] will abstain completely from alcohol  per self-report and random breathalyzer as indicated in addition to random UDS. (Progressing)     Start:  01/03/24    Expected End:  07/05/24         Jermaine [name] will overcome his social anxiety as evidenced by no longer feeling on the outside of Alcoholics Anonymous.  (Not Progressing)     Start:  01/03/24    Expected End:  07/05/24         The therapist will assist Jermaine [name] in being able to identify and avoid triggers for drinking.     Start:  01/03/24         The therapist will utilize CBT to help Jermaine [name] overcome his social anxiety involving group settings.      Start:  01/03/24            Summary: Jermaine [name] returns with the therapist informing him that the PA-C has indicated that if he asks Jermaine [name] to take a UDS that he must complete it within 24 hours of the request. Jermaine [name] sees him on the 20th.  Jermaine [name] says that he goes to a Kava bar noting that he feels comfortable in the bar-like atmosphere and that it has saved him from drinking on several occasions. He says that he only orders the Kava but admits that it was using the Kratom one can get there in the past until he recognized it was giving him a mild withdrawal.  He says that a friend suggested that Jermaine [name] try  a week ago telling him that it would not get him high but would help with his physical pain. Jermaine [name] says that it got him blitzed out of  his mind so he does not want to use it again.  During this session, the therapist explains to Jermaine [name] the reason that he must treat all addictive substances as potentially dangerous and  shows him a portion of the video, New Perspectives on Addiction & Recovery with Jermaine [name] asking the therapist to send him the link to the full video.  The therapist explores what is different such that he can feel comfortable at the Kava bar but not at meetings. It becomes apparent that Jermaine [name] has some irrational thoughts or beliefs that impede his ability to just be himself and relax at meetings.   Progress Towards Goals: Progressing/Not Progressing  Suicidal/Homicidal: No SI or HI  Plan: Return again in 2 weeks.  Diagnosis: Alcohol  Use Disorder, Severe and Social Anxiety  Collaboration of Care: Other N/A  Patient/Guardian was advised Release of Information must be obtained prior to any record release in order to collaborate their care with an outside provider. Patient/Guardian was advised if they have not already done so to contact the registration department to sign all necessary forms in order for us  to release information regarding their care.   Consent: Patient/Guardian gives verbal consent for treatment and assignment of benefits for services provided during this visit. Patient/Guardian expressed understanding and agreed to proceed.   Zell Maier, MA, LCSW, [facility], LCAS 01/09/2024

## 2024-01-13 ENCOUNTER — Ambulatory Visit (HOSPITAL_COMMUNITY): Admitting: Medical

## 2024-01-15 ENCOUNTER — Encounter (HOSPITAL_COMMUNITY): Payer: Self-pay | Admitting: Medical

## 2024-01-15 ENCOUNTER — Ambulatory Visit (HOSPITAL_BASED_OUTPATIENT_CLINIC_OR_DEPARTMENT_OTHER): Payer: MEDICAID | Admitting: Medical

## 2024-01-15 VITALS — BP 138/89 | HR 76 | Resp 20 | Ht 70.0 in | Wt 195.8 lb

## 2024-01-15 DIAGNOSIS — F419 Anxiety disorder, unspecified: Secondary | ICD-10-CM | POA: Diagnosis not present

## 2024-01-15 DIAGNOSIS — E785 Hyperlipidemia, unspecified: Secondary | ICD-10-CM

## 2024-01-15 DIAGNOSIS — F341 Dysthymic disorder: Secondary | ICD-10-CM

## 2024-01-15 DIAGNOSIS — K2920 Alcoholic gastritis without bleeding: Secondary | ICD-10-CM

## 2024-01-15 DIAGNOSIS — K458 Other specified abdominal hernia without obstruction or gangrene: Secondary | ICD-10-CM

## 2024-01-15 DIAGNOSIS — E66811 Obesity, class 1: Secondary | ICD-10-CM

## 2024-01-15 DIAGNOSIS — Z87898 Personal history of other specified conditions: Secondary | ICD-10-CM

## 2024-01-15 DIAGNOSIS — F4010 Social phobia, unspecified: Secondary | ICD-10-CM | POA: Diagnosis not present

## 2024-01-15 DIAGNOSIS — F1020 Alcohol dependence, uncomplicated: Secondary | ICD-10-CM

## 2024-01-15 DIAGNOSIS — Z0282 Encounter for adoption services: Secondary | ICD-10-CM

## 2024-01-15 DIAGNOSIS — Z789 Other specified health status: Secondary | ICD-10-CM

## 2024-01-15 DIAGNOSIS — R7989 Other specified abnormal findings of blood chemistry: Secondary | ICD-10-CM

## 2024-01-15 DIAGNOSIS — I83893 Varicose veins of bilateral lower extremities with other complications: Secondary | ICD-10-CM

## 2024-01-15 MED ORDER — BACLOFEN 10 MG PO TABS: 10.0000 mg | ORAL_TABLET | Freq: Three times a day (TID) | ORAL | 1 refills | Status: DC

## 2024-01-15 MED ORDER — PROPRANOLOL HCL 20 MG PO TABS: 20.0000 mg | ORAL_TABLET | Freq: Two times a day (BID) | ORAL | 2 refills | Status: DC | PRN

## 2024-01-15 MED ORDER — PREGABALIN 150 MG PO CAPS: 150.0000 mg | ORAL_CAPSULE | Freq: Three times a day (TID) | ORAL | 2 refills | Status: DC

## 2024-01-15 NOTE — Progress Notes (Deleted)
 Psychiatric Initial Adult Assessment   Patient Identification: Jermaine [name] MRN:  [id number] Date of Evaluation:  01/15/2024 Referral Source: *** Chief Complaint:  No chief complaint on file.  Visit Diagnosis: No diagnosis found.  History of Present Illness:  ***  Associated Signs/Symptoms: Depression Symptoms:  {DEPRESSION SYMPTOMS:20000} (Hypo) Manic Symptoms:  {BHH MANIC SYMPTOMS:22872} Anxiety Symptoms:  {BHH ANXIETY SYMPTOMS:22873} Psychotic Symptoms:  {BHH PSYCHOTIC SYMPTOMS:22874} PTSD Symptoms: {BHH PTSD SYMPTOMS:22875}  Past Psychiatric History: ***  Previous Psychotropic Medications: {YES/NO:21197}  Substance Abuse History in the last 12 months:  {yes no:314532}  Consequences of Substance Abuse: {BHH CONSEQUENCES OF SUBSTANCE ABUSE:22880}  Past Medical History:  Past Medical History:  Diagnosis Date  . Alcohol  abuse   . Anxiety   . Class 1 obesity 09/14/2021  . Depression     Past Surgical History:  Procedure Laterality Date  . HERNIA REPAIR      Family Psychiatric History: ***  Family History:  Family History  Adopted: Yes    Social History:   Social History   Socioeconomic History  . Marital status: Single    Spouse name: Not on file  . Number of children: Not on file  . Years of education: Not on file  . Highest education level: Not on file  Occupational History  . Not on file  Tobacco Use  . Smoking status: Never    Passive exposure: Never  . Smokeless tobacco: Never  Vaping Use  . Vaping status: Never Used  Substance and Sexual Activity  . Alcohol  use: Yes    Alcohol /week: 20.0 standard drinks of alcohol     Types: 20 Standard drinks or equivalent per week  . Drug use: Not Currently  . Sexual activity: Not on file  Other Topics Concern  . Not on file  Social History Narrative  . Not on file   Social Drivers of Health   Financial Resource Strain: Not on file  Food Insecurity: Patient Declined (07/28/2023)   Hunger Vital Sign    . Worried About Programme researcher, broadcasting/film/video in the Last Year: Patient declined   . Ran Out of Food in the Last Year: Patient declined  Transportation Needs: No Transportation Needs (12/11/2023)   PRAPARE - Transportation   . Lack of Transportation (Medical): No   . Lack of Transportation (Non-Medical): No  Physical Activity: Not on file  Stress: Not on file  Social Connections: Not on file    Additional Social History: ***  Allergies:   Allergies  Allergen Reactions  . Ketamine      Hx of causing fever in 2024   . Codeine Itching    Metabolic Disorder Labs: Lab Results  Component Value Date   HGBA1C 5.4 01/16/2023   MPG 108.28 01/16/2023   MPG 108.28 03/20/2019   No results found for: PROLACTIN Lab Results  Component Value Date   TRIG 126 01/23/2023   Lab Results  Component Value Date   TSH 1.241 07/29/2023    Therapeutic Level Labs: No results found for: LITHIUM No results found for: CBMZ No results found for: VALPROATE  Current Medications: Current Outpatient Medications  Medication Sig Dispense Refill  . busPIRone  (BUSPAR ) 5 MG tablet Take 1 tablet (5 mg total) by mouth 3 (three) times daily. 90 tablet 0  . chlordiazePOXIDE  (LIBRIUM ) 25 MG capsule Take 1 capsule (25 mg total) by mouth 3 (three) times daily as needed for anxiety or withdrawal. 30 capsule 0  . cloNIDine  (CATAPRES ) 0.2 MG tablet Take 0.5 tablets (0.1 mg total)  by mouth 2 (two) times daily in the am and at bedtime. for 2 days, THEN 0.5 tablets (0.1 mg total) daily before breakfast for 2 days. 3 tablet 0  . folic acid  (FOLVITE ) 1 MG tablet Take 1 tablet (1 mg total) by mouth daily. 30 tablet 0  . hydrOXYzine  (ATARAX ) 25 MG tablet Take 1 tablet (25 mg total) by mouth every 6 (six) hours as needed for anxiety. 30 tablet 0  . loperamide  (IMODIUM  A-D) 2 MG tablet Take 1-2 tablets (2-4 mg total) by mouth as needed for diarrhea or loose stools (diarrhea). 30 tablet 0  . mirtazapine  (REMERON ) 30 MG tablet  Take 1 tablet (30 mg total) by mouth at bedtime. 60 tablet 0  . ondansetron  (ZOFRAN -ODT) 4 MG disintegrating tablet Take 1 tablet (4 mg total) by mouth every 6 (six) hours as needed for nausea or vomiting. 20 tablet 0  . pantoprazole  (PROTONIX ) 40 MG tablet Take 1 tablet (40 mg total) by mouth daily at 12 noon. 30 tablet 0  . QUEtiapine  (SEROQUEL ) 50 MG tablet Take 1 tablet (50 mg total) by mouth at bedtime. 30 tablet 0  . thiamine  (VITAMIN B-1) 100 MG tablet Take 1 tablet (100 mg total) by mouth daily. 30 tablet 0   No current facility-administered medications for this visit.    Musculoskeletal: Strength & Muscle Tone: {desc; muscle tone:32375} Gait & Station: {PE GAIT ED WJUO:77474} Patient leans: {Patient Leans:21022755}  Psychiatric Specialty Exam: Review of Systems  There were no vitals taken for this visit.There is no height or weight on file to calculate BMI.  General Appearance: {Appearance:22683}  Eye Contact:  {BHH EYE CONTACT:22684}  Speech:  {Speech:22685}  Volume:  {Volume (PAA):22686}  Mood:  {BHH MOOD:22306}  Affect:  {Affect (PAA):22687}  Thought Process:  {Thought Process (PAA):22688}  Orientation:  {BHH ORIENTATION (PAA):22689}  Thought Content:  {Thought Content:22690}  Suicidal Thoughts:  {ST/HT (PAA):22692}  Homicidal Thoughts:  {ST/HT (PAA):22692}  Memory:  {BHH MEMORY:22881}  Judgement:  {Judgement (PAA):22694}  Insight:  {Insight (PAA):22695}  Psychomotor Activity:  {Psychomotor (PAA):22696}  Concentration:  {Concentration:21399}  Recall:  {BHH GOOD/FAIR/POOR:22877}  Fund of Knowledge:{BHH GOOD/FAIR/POOR:22877}  Language: {BHH GOOD/FAIR/POOR:22877}  Akathisia:  {BHH YES OR NO:22294}  Handed:  {Handed:22697}  AIMS (if indicated):  {Desc; done/not:10129}  Assets:  {Assets (PAA):22698}  ADL's:  {BHH JIO'D:77709}  Cognition: {chl bhh cognition:304700322}  Sleep:  {BHH GOOD/FAIR/POOR:22877}   Screenings: GAD-7    Advertising copywriter from 01/02/2024  in [facility]  Total GAD-7 Score 11   PHQ2-9    Flowsheet Row Counselor from 01/02/2024 in [facility] Total Score 5  PHQ-9 Total Score 19   Flowsheet Row Counselor from 01/02/2024 in [facility] ED to Hosp-Admission (Discharged) from 12/10/2023 in [facility] to Hosp-Admission (Discharged) from 07/28/2023 in [facility]  C-SSRS RISK CATEGORY Moderate Risk No Risk No Risk    Assessment and Plan: ***  Collaboration of Care: {BH OP Collaboration of Care:21014065}  Patient/Guardian was advised Release of Information must be obtained prior to any record release in order to collaborate their care with an outside provider. Patient/Guardian was advised if they have not already done so to contact the registration department to sign all necessary forms in order for us  to release information regarding their care.   Consent: Patient/Guardian gives verbal consent for treatment and assignment of benefits  for services provided during this visit. Patient/Guardian expressed understanding and agreed to proceed.   Carlin Emmer, PA-C 8/20/20253:36 PM
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
## 2024-01-23 ENCOUNTER — Ambulatory Visit (INDEPENDENT_AMBULATORY_CARE_PROVIDER_SITE_OTHER): Payer: MEDICAID | Admitting: Licensed Clinical Social Worker

## 2024-01-23 DIAGNOSIS — F4010 Social phobia, unspecified: Secondary | ICD-10-CM | POA: Diagnosis not present

## 2024-01-23 DIAGNOSIS — F341 Dysthymic disorder: Secondary | ICD-10-CM | POA: Diagnosis not present

## 2024-01-23 DIAGNOSIS — F1020 Alcohol dependence, uncomplicated: Secondary | ICD-10-CM | POA: Diagnosis not present

## 2024-01-23 NOTE — Progress Notes (Signed)
 THERAPIST PROGRESS NOTE  Session Time: 9 a.m. to 10:06 a.m.   Type of Therapy: Individual   Therapist Response/Interventions: CBT/The therapist observes that Jermaine [name] wants to stop drinking but not change his life in continuing to attend music festivals, go to sports bars, etc. The therapist reminds Jermaine [name] of the need to avoid triggers especially in early recovery.  He addresses irrational beliefs contributing to his social anxiety and suggests that [name] do in AA what he did with the hiking group which is not hide that he has this social anxiety.   Treatment Goals addressed:  Active     Substance Use     Jermaine [name] will abstain completely from alcohol  per self-report and random breathalyzer as indicated in addition to random UDS. (Progressing)     Start:  01/03/24    Expected End:  07/05/24         Jermaine [name] will overcome his social anxiety as evidenced by no longer feeling on the outside of Alcoholics Anonymous.  (Progressing)     Start:  01/03/24    Expected End:  07/05/24         The therapist will assist Jermaine [name] in being able to identify and avoid triggers for drinking.     Start:  01/03/24         The therapist will utilize CBT to help Jermaine [name] overcome his social anxiety involving group settings.      Start:  01/03/24               Summary: Jermaine [name] presents today saying that he has attended no meetings this past week and broke ties with his Sponsor noting that he feels less anxious as he does not have the pressure of being told to call three different people in the program or the pressure to share.   He has started the baclofen  saying that the last time he had cravings was the day he met with the PA-C. He went on the hike with the hiking group and admitted to them afterwards that he almost cancelled due to social anxiety but was glad that he did not.  Jermaine [name] talks about wanting to getting back to music festivals or being able to hanging out with people at the sports bar watching football  games as he feels comfortable in both situations; however both are in close proximity to alcohol . He admits that he has never really attended a music festival without drinking.   The major focus of the session involves one of the five rules of recovery which is change your life and irrational thinking leading to Mike's socia anxiety. Jermaine [name] says that when he has gone to meetings that there seems to be a person who does not really know him that does not like him and he wants everyone to like him. He also perceives that people few him as stuck up as he does not say anything.  The therapist recommends sharing about social anxiety and  how it impacts his recovery at an open discussion meeting as he will find that most of the people in the room will identify with what he is experiencing. The therapist encourages exposure therapy; however, in social settings without alcohol  and other triggers involved.   Progress Towards Goals: Progressing  Suicidal/Homicidal: No SI or HI  Plan: Return again in 2 weeks.  Diagnosis: Alcohol  Use Disorder, Severe and Social Anxiety  Collaboration of Care: Other N/A  Patient/Guardian was advised Release of Information must be obtained prior to any record release in  order to collaborate their care with an outside provider. Patient/Guardian was advised if they have not already done so to contact the registration department to sign all necessary forms in order for us  to release information regarding their care.   Consent: Patient/Guardian gives verbal consent for treatment and assignment of benefits for services provided during this visit. Patient/Guardian expressed understanding and agreed to proceed.   Zell Maier, MA, LCSW, [facility], LCAS 01/23/2024

## 2024-01-30 ENCOUNTER — Other Ambulatory Visit: Payer: Self-pay

## 2024-01-30 ENCOUNTER — Encounter (HOSPITAL_COMMUNITY): Payer: Self-pay | Admitting: Medical

## 2024-01-30 ENCOUNTER — Ambulatory Visit (HOSPITAL_COMMUNITY): Payer: MEDICAID | Admitting: Medical

## 2024-01-30 VITALS — BP 115/81 | HR 74 | Ht 70.0 in | Wt 199.0 lb

## 2024-01-30 DIAGNOSIS — F4010 Social phobia, unspecified: Secondary | ICD-10-CM | POA: Diagnosis not present

## 2024-01-30 DIAGNOSIS — F419 Anxiety disorder, unspecified: Secondary | ICD-10-CM

## 2024-01-30 DIAGNOSIS — E66811 Obesity, class 1: Secondary | ICD-10-CM

## 2024-01-30 DIAGNOSIS — F341 Dysthymic disorder: Secondary | ICD-10-CM | POA: Diagnosis not present

## 2024-01-30 DIAGNOSIS — F1020 Alcohol dependence, uncomplicated: Secondary | ICD-10-CM | POA: Diagnosis not present

## 2024-01-30 DIAGNOSIS — R7989 Other specified abnormal findings of blood chemistry: Secondary | ICD-10-CM

## 2024-01-30 DIAGNOSIS — I83893 Varicose veins of bilateral lower extremities with other complications: Secondary | ICD-10-CM

## 2024-01-30 DIAGNOSIS — Z87898 Personal history of other specified conditions: Secondary | ICD-10-CM

## 2024-01-30 DIAGNOSIS — Z0282 Encounter for adoption services: Secondary | ICD-10-CM

## 2024-01-30 DIAGNOSIS — Z789 Other specified health status: Secondary | ICD-10-CM

## 2024-01-30 DIAGNOSIS — K2920 Alcoholic gastritis without bleeding: Secondary | ICD-10-CM

## 2024-01-30 MED ORDER — BUPROPION HCL ER (SR) 100 MG PO TB12: 100.0000 mg | ORAL_TABLET | Freq: Two times a day (BID) | ORAL | 2 refills | Status: DC

## 2024-01-30 NOTE — Progress Notes (Signed)
 [facility] MD/PA/NP OP Progress Note  01/15/2024  Jermaine [name]  MRN:  [id number]  Chief Complaint:  Chief Complaint  Patient presents with   Follow-up   Alcohol  Problem   Adopted as infant   Social anxiety   Dysthymia   HPI: Jermaine [name] for 2 week FU from initial visit to assess his response to Baclofen  and Pegabalin+Propranolol  for his severe Social anxiety. He reports no further alcohol  use but at a social gathering he had to leave. He did not try the Propranolol . He continues to c/o anhedonia/anergia and difficulty concentrating.  Visit Diagnosis:    ICD-10-CM   1. Alcohol  use disorder, severe, dependence (HCC)  F10.20     2. Social anxiety disorder  F40.10     3. 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 Anxiety disorder with panic attacks  F41.9     5. Chronic alcoholic gastritis without hemorrhage  K29.20     6. Class 1 obesity  E66.811     7. Family history not known due to adoption  Z78.9     8. Adopted infant  Z02.82     9. History of elevated PSA  Z87.898     10. Low testosterone in male  R44.89     54. Varicose veins of bilateral lower extremities with other complications  I83.893       Past Psychiatric History:  [facility] for residential treatment a 3 times, he says that he was in [facility]'s intensive outpatient treatment program twice  Sister had him hospitalized approximately a year ago at [facility].    Past Medical History:   CARE EVERYWHERE Reviewed Plan of Treatment - documented as of this encounter  Plan of Treatment - Upcoming Encounters Upcoming Encounters Date Type Department Care Team (Latest Contact Info) Description  02/03/2024 3:00 PM EDT Consult [facility] - Vascular and Endovascular Surgery  [address]  [place], [place] [address]  [phone]  Jermaine Mabel Christmas, PA-C  [address]  [address]  [place], [place] [address]  [phone] (Work)  [phone] (Fax)      03/02/2024 10:30 AM EDT Consult [facility] - UROLOGY [place]  [address]  [place], [place] [address]  [phone]  Jermaine Evalene Duos, MD  [address]  [place], [place] [address]  [phone] (Work)  [phone] (Fax)    PSA, Total 4.81 (H) 0.00 - 3.50 ng/mL       03/12/2024 10:00 AM EDT [facility] Visit [facility] - [facility]STRO[facility]OLOGY [place]  [address]  [address]  [place], [place] [address]  [phone]       07/01/2024 10:40 AM EST [facility] Visit [facility] - Internal [facility] [place]  [address]  [address]  [place], [place] [address]  [phone]  Jermaine [name], [place]  [address]  [address]  [place], [place] [address]  [phone] (Work)  [phone] (Fax)    Plan of Treatment - documented as of this encounter  Plan of Treatment - Upcoming Encounters Upcoming Encounters Date Type Department Care Team (Latest Contact Info) Description  02/03/2024 3:00 PM EDT [facility] - Vascular and Endovascular Surgery  [address]  [address]  [place], [place] [address]  [phone]  Jermaine Vevelyn Christmas, PA-C  [address]  [address]  [place], [place] [address]  [phone] (Work)  [phone] (Fax)  03/02/2024 10:30 AM EDT Consult [facility] - [facility]OLOGY [place]  [address]  [place], [place] [address]  [phone]  Jermaine Evalene Duos, MD  [address]  [place], [place] [address]  [phone] (Work)  [phone] (Fax)     03/12/2024 10:00 AM EDT [facility] - GASTROENTEROLOGY [place]  [address]  [address]  [place], [place] [address]  [phone]       07/01/2024 10:40 AM EST [facility] Visit [facility] - Internal [facility] [place]  [address]  [address]  [place], [place] [address]  [phone]  Jermaine Bers [place], [place]  [address]  [address]  [place], [place] [address]  [phone] (Work)  [phone] (Fax)      - 01/19/2024  ULTRASOUND ABDOMEN LIMITED  COMPARISON: None Available.  FINDINGS: Targeted ultrasound was performed of the site of palpable concern in the midline abdomen. There is a small fat containing hernia noted at the site of palpable concern. Wall defect is poorly assessed but is estimated to span approximately 5 by 5 mm.  Past Medical History:  Diagnosis Date   Alcohol  abuse    Anxiety    Class 1 obesity 09/14/2021   Depression     Past Surgical History:  Procedure Laterality Date   HERNIA REPAIR      Family Psychiatric History:  Adopted as an infant  Family History:  Family History  Adopted: Yes    Social History:  Social History   Socioeconomic History   Marital status: Single      Spouse name: never been married    Number of children: has no kids   Years of education: attended college for eight years having enough credits for two different degrees but did not graduate as he could not get through his foreign language requirements.    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    Sexual activity: Not on file  Other Topics Concern   Not on file  Social History Narrative   He lives in an apartment by himself which is the same place he has lived since moving back to this area  His mother and sister who live in [place]  are supportive.  Has no kids as he believes that his drinking was too severe for him to have a wife and kids.     Social Drivers of Acupuncturist Strain: Not on file  Food Insecurity: Patient Declined (07/28/2023)    Hunger Vital Sign     Worried About Running Out of Food in the Last Year: Patient declined     Ran Out of Food in the Last Year: Patient declined  Transportation Needs: No Transportation Needs (12/11/2023)  PRAPARE - Therapist, art (Medical): No     Lack of Transportation (Non-Medical): No  Physical Activity: Not on file  Stress:  Stressors:  Illness    Coping Ability:  Overwhelmed      Social Connections: See Narrative     Allergies:  Allergies  Allergen Reactions   Ketamine      Hx of causing fever in 2024    Codeine Itching    Metabolic Disorder Labs: Lab Results  Component Value Date   HGBA1C 5.4 01/16/2023   MPG 108.28 01/16/2023   MPG 108.28 03/20/2019  Glucose, Random  01/03/24  70 - 99 fh/iO12  No results found for: PROLACTIN  Lab Results  Component Value Date   TRIG 126 01/23/2023   Component  Ref Range & Units 1 mo ago Comments  Cholesterol, Total, Lipid Panel  <20Tt0 mg/dL Triglycerides, Lipid Panel  <150 mg/dL 898    HDL Cholesterol - Lipid Panel  >=60 m  49 Low     224 High   NCEP III Guidelines   Total Cholesterol             Risk Classification                           <200 mg/dL                      Desirable    200-239 mg/dL                   Borderline High    >240 mg/dL                      High          LDL Cholesterol, Calculated  <100 mg/dL  Non-HDL Cholesterol  mg/dL     845 High      824  LDL Cholesterol is calculated using the Martin/Hopkins equation. Source:  JAMA 2013; 310:  2061-68.  NCEP-ATP III Guidelines    LDLc                     Risk Classification                           <100 mg/dL                    Optimal    100-129 mg/dL                Desirable    130-159 mg/dL                Borderline High    160-189 mg/dL                High    >190 mg/dL                   Very High  Non-HDL Cholesterol  mg/dL 824      Lab Results  Component Value Date   TSH 1.241 07/29/2023   TSH 0.544 04/22/2022  TSH   01/01/2024 0.450 - 5.330 uIU/mL1.987  Therapeutic Level Labs:NA   Current Medications: Current Outpatient Medications  Medication Sig Dispense Refill   baclofen  (LIORESAL ) 10 MG tablet Take 1 tablet (10 mg total) by mouth 3 (three) times daily. 90 tablet 1   Cholecalciferol 125 MCG (5000 UT)  TABS Take 5,000 Units by mouth.     hydrOXYzine  (ATARAX ) 25 MG tablet Take 1 tablet (25 mg total) by mouth every 6 (six) hours as needed for anxiety. 30 tablet 0   pregabalin  (LYRICA ) 150 MG capsule Take 1 capsule (150 mg total) by mouth in the morning, at noon, and at bedtime. 90 capsule 2   propranolol  (INDERAL ) 20 MG tablet Take 1 tablet (20 mg total) by mouth 2 (two) times daily as needed. 60 tablet 2   No current facility-administered medications for this visit.     Musculoskeletal: Strength & Muscle Tone: within normal limits Gait & Station: normal Patient leans: N/A  Psychiatric Specialty Exam: Review of Systems  Constitutional:  Negative for activity change, appetite change, chills, diaphoresis, fatigue, fever and unexpected weight change.  HENT:  Negative for congestion, dental problem, ear pain, hearing loss, mouth sores, nosebleeds, postnasal drip, rhinorrhea, sinus pressure, sinus pain, sneezing, sore throat, tinnitus, trouble swallowing and voice change.   Gastrointestinal:  Positive for abdominal distention (?recurrent hernia) and abdominal pain. Negative for anal bleeding, blood in stool, constipation, diarrhea, nausea, rectal pain and vomiting.  Genitourinary:        ? Low Testosterone Testosterone, Total 150 - 684 ng/dL 648    Neurological:  Negative for dizziness,  tremors, seizures, syncope, facial asymmetry, speech difficulty, weakness, light-headedness, numbness and headaches.  Psychiatric/Behavioral:  Positive for agitation, decreased concentration and sleep disturbance. The patient is nervous/anxious.   All other systems reviewed and are negative.   Blood pressure 115/81, pulse 74, height 5' 10 (1.778 m), weight 199 lb (90.3 kg).Body mass index is 28.55 kg/m.  General Appearance: Casual and Neat  Eye Contact:  Fair  Speech:  Clear and Coherent and Normal Rate  Volume:  Normal  Mood:  Dysphoric  Affect:  Appropriate and Congruent  Thought Process:  Coherent, Goal Directed, and Descriptions of Associations: Intact  Orientation:  Full (Time, Place, and Person)  Thought Content: WDL, Logical, Illogical, and Obsessions   Suicidal Thoughts:  No  Homicidal Thoughts:  No  Memory:  Trauma informed  Judgement:  Impaired  Insight:  Lacking  Psychomotor Activity:  Some Restlessness  Concentration:  Concentration: Good and Attention Span: Good  Recall:  Fair  Fund of Knowledge: WDL  Language: Good  Akathisia:  NA  Handed:  Right  AIMS (if indicated): NA  Assets:  Desire for Improvement Financial Resources/Insurance Housing Leisure Time Physical Health Resilience Social Support Talents/Skills Transportation  ADL's:  Impaired  Cognition: Impaired,  Moderate  Sleep:  Requires medications   Screenings: GAD-7    Flowsheet Row [facility] Counselor from 01/02/2024 in [facility]  Total GAD-7 Score 10 11   PHQ2-9    Flowsheet Row Office Visit from 01/30/2024 in [facility] Counselor from 01/02/2024 in [facility] Total Score 4 5  PHQ-9 Total Score 15 19   Flowsheet Row Counselor from 01/02/2024 in [facility] at  [facility] ED to Hosp-Admission (Discharged) from [facility]/2023 in [facility] Hosp-Admission (Discharged) from 07/28/2023 in [facility]  C-SSRS RISK CATEGORY Moderate Risk No Risk No Risk     Assessment Improvement on AUD                       No improvement on  anxiety and attention                         Elevated PSA being followed at Urology per pt                        Small abdominal hernia at repair site                    and Plan: PHQ9 and GAD 7 repeated                  Handouts on Psychiatric problems of pt with infant adoption                    and on Behavioral approaches to ADHD                   Rx Wellbutrin  SR for Dysthymia ans Attention                   FU 1 month sooner if needed  Collaboration of Care: Collaboration of Care: Medication Management   and Primary Care Provider   Patient/Guardian was advised Release of Information must be obtained prior to any record release in order to collaborate their care with an outside provider. Patient/Guardian was advised if they have not already done so to contact the registration department to sign all necessary forms in order for us  to release information regarding their care.   Consent: Patient/Guardian gives verbal consent for treatment and assignment of benefits for services provided during this visit. Patient/Guardian expressed understanding and agreed to proceed.    Carlin Emmer, PA-C  01/15/2024 (01/30/2024, 3:30 PM)

## 2024-02-09 ENCOUNTER — Other Ambulatory Visit (HOSPITAL_COMMUNITY): Payer: Self-pay | Admitting: Medical

## 2024-02-11 ENCOUNTER — Ambulatory Visit (INDEPENDENT_AMBULATORY_CARE_PROVIDER_SITE_OTHER): Payer: MEDICAID | Admitting: Licensed Clinical Social Worker

## 2024-02-11 DIAGNOSIS — F4010 Social phobia, unspecified: Secondary | ICD-10-CM

## 2024-02-11 DIAGNOSIS — F1020 Alcohol dependence, uncomplicated: Secondary | ICD-10-CM

## 2024-02-11 DIAGNOSIS — F341 Dysthymic disorder: Secondary | ICD-10-CM

## 2024-02-11 NOTE — Progress Notes (Signed)
 THERAPIST PROGRESS NOTE  Session Time: 10 a.m. to 11:05 a.m.   Type of Therapy: Individual   Therapist Response/Interventions: CBT/The therapist validates Ishii's feelings about what happened at work agreeing that this coworkers handling of the situation was not appropriate.  The therapist talks to him about passive, aggressive, and assertive responses and models some ways that he could handle this situation assertively.  The therapist expresses his concerns about Matuska's isolating himself pointing out that this could eventually lead to relapse.  The therapist explains that it would be beneficial for [facility] to develop sober supports whom he could call when upset about things like what happened at work recently family as opposed to trying to handle his emotions and the situations on his own.  The therapist discloses his belief that it is not possible for 1 person to be liked by everyone noting that people tend to like others with whom they are more compatible and with whom they share a similar belief system.  The therapist mentions SA IOP as a possible treatment option to allow Pop to process these issues in a group setting in which she is likely to receive a lot of consensual validation.  Treatment Goals addressed:  Active     Substance Use     [name] will abstain completely from alcohol  per self-report and random breathalyzer as indicated in addition to random UDS. (Progressing)     Start:  01/03/24    Expected End:  07/05/24         [name] will overcome his social anxiety as evidenced by no longer feeling on the outside of Alcoholics Anonymous.  (Not Progressing)     Start:  01/03/24    Expected End:  07/05/24         The therapist will assist [name] in being able to identify and avoid triggers for drinking.     Start:  01/03/24         The therapist will utilize CBT to help [name] overcome his social anxiety involving group settings.      Start:  01/03/24                   Summary: Feggins presents today saying that he has not worked since Saturday but that there is a situation at work which she cannot get out of his mind that has been bothering him.  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  Upon doing so, a coworker started hollering at the at [facility] telling him that he was not supposed to do this and then claiming that [facility] new better even though he did not.  He says that the guy left before he could speak to to him.  Macdonnell admits that he has quit jobs in the past as a result of similar situations and notes that it is a good thing that he did not end up drinking over this as he thought about doing so.  He says that he continues to take the baclofen  but cannot tell if it is working.  He says that he has not been back to any AA meetings but that he feels okay about this.  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  As he is not  associating with people people who drink or attending meetings, he says that all he does is just sit at home.  The major focus of the session continues to be his social anxiety and the underlying beliefs that contribute to it.  The therapist focuses on Ewell's irrational belief about wanting everyone to like him and also his perfectionism that makes him overly hard on himself when he makes any sort of mistake in a social setting.  Additionally, the therapist notes that [facility] only has a couple of months of sobriety such that it would be unrealistic to expect him to be feeling better and to have resolved his social anxiety.  Progress Towards Goals: Hollomon reports that he has remained sober; however, he has not progressed in terms of his social anxiety.  Suicidal/Homicidal: No SI or HI  Plan: In spite of his current difficulties, he  wishes to return in 4 weeks.  This therapist encourages [place] to resume his AA meetings, get a sponsor, and use the approach he did with the hiking group and being open about his social anxiety.  Diagnosis: Alcohol  Use Disorder, Severe and Social Anxiety  Collaboration of Care: Other N/A  Patient/Guardian was advised Release of Information must be obtained prior to any record release in order to collaborate their care with an outside provider. Patient/Guardian was advised if they have not already done so to contact the registration department to sign all necessary forms in order for us  to release information regarding their care.   Consent: Patient/Guardian gives verbal consent for treatment and assignment of benefits for services provided during this visit. Patient/Guardian expressed understanding and agreed to proceed.   Zell Maier, MA, LCSW, [facility], LCAS 02/11/2024

## 2024-02-13 ENCOUNTER — Other Ambulatory Visit (HOSPITAL_COMMUNITY): Payer: Self-pay | Admitting: Medical

## 2024-02-13 MED ORDER — BACLOFEN 10 MG PO TABS: 10.0000 mg | ORAL_TABLET | Freq: Three times a day (TID) | ORAL | 0 refills | Status: DC

## 2024-02-13 NOTE — Progress Notes (Signed)
 Patient ID: Jermaine [name], male   DOB: Apr 14, 1966, 58 y.o.   MRN: [id number] PHARMACY REQUEST PER INSURE 90 DAY RX DONE pRIOR RX CANCELED

## 2024-02-22 ENCOUNTER — Other Ambulatory Visit (HOSPITAL_COMMUNITY): Payer: Self-pay | Admitting: Medical

## 2024-03-05 ENCOUNTER — Encounter (HOSPITAL_COMMUNITY): Payer: Self-pay

## 2024-03-05 ENCOUNTER — Telehealth (HOSPITAL_COMMUNITY): Payer: MEDICAID | Admitting: Medical

## 2024-03-12 ENCOUNTER — Other Ambulatory Visit (HOSPITAL_COMMUNITY): Payer: Self-pay | Admitting: Medical

## 2024-03-12 ENCOUNTER — Ambulatory Visit (HOSPITAL_COMMUNITY): Payer: MEDICAID | Admitting: Licensed Clinical Social Worker

## 2024-03-12 MED ORDER — PROPRANOLOL HCL 20 MG PO TABS: 20.0000 mg | ORAL_TABLET | Freq: Two times a day (BID) | ORAL | 0 refills | Status: DC | PRN

## 2024-03-12 MED ORDER — BUPROPION HCL ER (SR) 100 MG PO TB12: 100.0000 mg | ORAL_TABLET | Freq: Two times a day (BID) | ORAL | 0 refills | Status: DC

## 2024-03-12 NOTE — Progress Notes (Signed)
90 day rx request

## 2024-03-12 NOTE — Progress Notes (Signed)
 [facility] request 90 days per insurer

## 2024-03-16 ENCOUNTER — Ambulatory Visit (HOSPITAL_COMMUNITY): Payer: MEDICAID | Admitting: Medical

## 2024-03-16 ENCOUNTER — Encounter (HOSPITAL_COMMUNITY): Payer: Self-pay | Admitting: Medical

## 2024-03-16 ENCOUNTER — Other Ambulatory Visit: Payer: Self-pay

## 2024-03-16 VITALS — BP 134/84 | HR 82 | Ht 70.0 in | Wt 199.0 lb

## 2024-03-16 DIAGNOSIS — K2920 Alcoholic gastritis without bleeding: Secondary | ICD-10-CM

## 2024-03-16 DIAGNOSIS — R7989 Other specified abnormal findings of blood chemistry: Secondary | ICD-10-CM

## 2024-03-16 DIAGNOSIS — E66811 Obesity, class 1: Secondary | ICD-10-CM

## 2024-03-16 DIAGNOSIS — F4010 Social phobia, unspecified: Secondary | ICD-10-CM | POA: Diagnosis not present

## 2024-03-16 DIAGNOSIS — Z0282 Encounter for adoption services: Secondary | ICD-10-CM

## 2024-03-16 DIAGNOSIS — Z789 Other specified health status: Secondary | ICD-10-CM

## 2024-03-16 DIAGNOSIS — F419 Anxiety disorder, unspecified: Secondary | ICD-10-CM

## 2024-03-16 DIAGNOSIS — Z87898 Personal history of other specified conditions: Secondary | ICD-10-CM

## 2024-03-16 DIAGNOSIS — R972 Elevated prostate specific antigen [PSA]: Secondary | ICD-10-CM

## 2024-03-16 DIAGNOSIS — F1020 Alcohol dependence, uncomplicated: Secondary | ICD-10-CM

## 2024-03-16 DIAGNOSIS — F341 Dysthymic disorder: Secondary | ICD-10-CM | POA: Diagnosis not present

## 2024-03-16 DIAGNOSIS — I83893 Varicose veins of bilateral lower extremities with other complications: Secondary | ICD-10-CM

## 2024-03-16 NOTE — Progress Notes (Signed)
 BH MD/PA/NP OP Progress Note  03/16/2024 5:03 PM Vamsi Apfel  MRN:  [id number]  Chief Complaint:  Chief Complaint  Patient presents with   Follow-up   Alcohol  Problem   Trauma   Stress   Anxiety   HPI: Perdue returns for scheduled FU for for his complex mental health issues and addicted brain (AUD Severe Dependence) beginning with fallout from his infant adoption beginning with a history of severe anxiety in early childhood when asked to stand up in front of his class and speak..In addition, he admits this revelation set him back emotionally with dysthymia, especially as he compared to his peers, His parents were poor and could not afford the kinds of clothes he saw them wearing.   He knows nothing about his genetic heritage nor why he was given up for adoption. At his last visit,he was entertaining the theory that he had ADHD although he was never diagnosed and did not require special education in childhood. His PHQ 9 Depression response has been  nearly every day  Trouble concentrating on things, such as reading the newspaper  or watching television  Nearly every day  [name] is still recalling the sense of ease and comfort that alcohol  used to bring in the past especially in bars where he always felt at ease /acceptable.(Wishes his meds would give him that feeling) He reports that since starting Wellbutrin  he has begun to have sense of energy and attention.He actually organized one of his closets. Baclofen  has given him a sense more of thinking about drinking rather than an uncontrollable urge. He also reports that he is planning his holiday trip to [place] where Mom and sister live and his awarenessthat airports are big triggers ;however,this year when he bought his ticket he did not feel the anxiety /excitement in anticipation that he has in past years. He reports he is not worried about his upcoming prostate MRI.   Visit Diagnosis:    ICD-10-CM   1. Alcohol  use disorder, severe,  dependence (HCC)  F10.20     2. Social anxiety disorder  F40.10     3. 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 Anxiety disorder with panic attacks  F41.9     5. Chronic alcoholic gastritis without hemorrhage  K29.20     6. Class 1 obesity  E66.811     7. Family history not known due to adoption  Z78.9     8. Adopted infant  Z02.82     9. History of elevated PSA  Z87.898     10. Low testosterone in male  R59.89     69. Varicose veins of bilateral lower extremities with other complications  I83.893     12. Elevated PSA measurement  R97.20       Past Psychiatric History:  [facility] for residential treatment a 3 times, he says that he was in [facility]'s intensive outpatient treatment program twice  Sister had him hospitalized approximately a year ago at [facility].    Past Medical History:  CARE EVERYWHERE REVIEWED   ULTRASOUND ABDOMEN LIMITED  01/19/2024 COMPARISON:  None Available.  FINDINGS:  Targeted ultrasound was performed of the site of palpable concern in  the midline abdomen. There is a small fat containing hernia noted at  the site of palpable concern. Wall defect is poorly assessed but is  estimated to span approximately 5 by 5 mm.  IMPRESSION:  There is a small fat containing hernia noted at the site  of palpable concern.   02/12/2024 Office Visit [facility] - GASTROENTEROLOGY  Diagnoses  Colon cancer screening   Procedures  Endo Colon  PR COLONOSCOPY FLX DX W/COLLJ SPEC WHEN PFRMD  COLONOSCOPY W/BIOPSY SINGLE/MULTIPLE  PR COLSC FLX WITH DIRECTED SUBMUCOSAL NJX ANY SBST  PR COLSC FLEXIBLE W/CONTROL BLEEDING ANY METHOD  PR COLSC FLX W/RMVL OF TUMOR POLYP LESION SNARE TQ   03/02/2024  Consult [facility] - UROLOGY  Assessment: BPH with elevated PSA and moderate LUTS/elevated PVR Plan: Repeat free and total PSA Prostate MRI I discussed the various causes for elevated PSA RTC 6 weeks to  review results of labs and MRI and possibly schedule prostate biopsies   Past Medical History:  Diagnosis Date   Alcohol  abuse    Anxiety    Class 1 obesity 09/14/2021   Depression     Past Surgical History:  Procedure Laterality Date   HERNIA REPAIR      Family Psychiatric History: Adopted  Family History:  Family History  Adopted: Yes    Social History:  Social History   ocioeconomic History   Marital status: Single      Spouse name: never been married    Number of children: has no kids   Years of education: attended college for eight years having enough credits for two different degrees but did not graduate as he could not get through his foreign language requirements.    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    Sexual activity: Not on file  Other Topics Concern   Not on file  Social History Narrative   He lives in an apartment by himself which is the same place he has lived since moving back to this area His mother and sister who live in [place]  are supportive.  Has no kids as he believes that his drinking was too severe for him to have a wife and kids.     Social Drivers of Barista Strain: Not on file  Food Insecurity: Patient Declined (07/28/2023)    Hunger Vital Sign     Worried About Running Out of Food in the Last Year: Patient declined     Ran Out of Food in  the Last Year: Patient declined  Transportation Needs: No Transportation Needs (12/11/2023)    PRAPARE - Therapist, art (Medical): No     Lack of Transportation (Non-Medical): No  Physical Activity: Not on file  Stress:  Stressors:  Illness    Coping Ability:  Overwhelmed      Social Connections: See Narrative     Allergies:  Allergies  Allergen Reactions   Ketamine      Hx of causing  fever in 2024    Codeine Itching    Metabolic Disorder Labs: Lab Results  Component Value Date   HGBA1C 5.4 01/16/2023   MPG 108.28 01/16/2023   MPG 108.28 03/20/2019   No results found for: PROLACTIN Lab Results  Component Value Date   TRIG 126 01/23/2023   Lab Results  Component Value Date   TSH 1.241 07/29/2023   TSH 0.544 04/22/2022    Therapeutic Level Labs:NA   Current Medications: Current Outpatient Medications  Medication Sig Dispense Refill   baclofen  (LIORESAL ) 10 MG tablet Take 1 tablet (10 mg total) by mouth 3 (three) times daily. 270 tablet 0   buPROPion  ER (WELLBUTRIN  SR) 100 MG 12 hr tablet Take 1 tablet (100 mg total) by mouth 2 (two) times daily. 180 tablet 0   Cholecalciferol 125 MCG (5000 UT) TABS Take 5,000 Units by mouth.     hydrOXYzine  (ATARAX ) 25 MG tablet Take 1 tablet (25 mg total) by mouth every 6 (six) hours as needed for anxiety. 30 tablet 0   pregabalin  (LYRICA ) 150 MG capsule Take 1 capsule (150 mg total) by mouth in the morning, at noon, and at bedtime. 90 capsule 2   propranolol  (INDERAL ) 20 MG tablet Take 1 tablet (20 mg total) by mouth 2 (two) times daily as needed. 180 tablet 0   thiamine  (VITAMIN B1) 100 MG tablet 1 tablet Orally Once a day; Duration: 90 days     No current facility-administered medications for this visit.     Musculoskeletal: Strength & Muscle Tone: within normal limits Gait & Station: normal Patient leans: N/A  Psychiatric Specialty Exam: Review of Systems  Constitutional:  Positive for  activity change. Negative for appetite change, chills, diaphoresis, fatigue, fever and unexpected weight change.  HENT:  Negative for congestion, dental problem, ear pain, nosebleeds, postnasal drip, rhinorrhea, sinus pressure, sinus pain, sneezing, sore throat, tinnitus, trouble swallowing and voice change.   Eyes: Negative.   Respiratory: Negative.    Cardiovascular: Negative.   Gastrointestinal:  Positive for abdominal distention and abdominal pain. Negative for anal bleeding, blood in stool, constipation, diarrhea, nausea, rectal pain and vomiting.       Abd wall hernia  Endocrine: Negative for cold intolerance, heat intolerance, polydipsia, polyphagia and polyuria.  Genitourinary:  Negative for decreased urine volume, difficulty urinating, dysuria, enuresis, flank pain, frequency, genital sores, hematuria, penile discharge, penile pain, penile swelling, scrotal swelling, testicular pain and urgency.       Elevated PSA  Musculoskeletal: Negative.   Skin: Negative.   Allergic/Immunologic: Negative.   Neurological:  Negative for dizziness, tremors, seizures, syncope, facial asymmetry, speech difficulty, weakness, light-headedness, numbness and headaches.  Hematological: Negative.   Psychiatric/Behavioral:  Positive for agitation, decreased concentration, dysphoric mood and sleep disturbance. Negative for behavioral problems, confusion, hallucinations, self-injury and suicidal ideas. The patient is nervous/anxious. The patient is not hyperactive.     Blood pressure 134/84, pulse 82, height 5' 10 (1.778 m), weight 199 lb (90.3 kg).Body mass index is 28.55 kg/m.  General Appearance: Casual  Eye Contact:  Good  Speech:  Clear and Coherent and Normal Rate  Volume:  Normal  Mood:  Variable  Affect:  Appropriate and Congruent  Thought Process:  Coherent, Goal Directed, and Descriptions of Associations: Intact  Orientation:  Full (Time, Place, and Person)  Thought Content: WDL, Logical,  Illogical, and Obsessions (Alcohol )  Suicidal Thoughts:  No  Homicidal Thoughts:  No  Memory:  Trauma informed  Judgement:  Impaired  Insight:  Lacking  Psychomotor Activity:  Negative  Concentration:  Concentration: in tact for visit and Attention Span: Intact for visit  Recall:  see memory  Fund of Knowledge: WDL  Language: Good  Akathisia:  NA  Handed:  Right  AIMS (if indicated): NA  Assets:  Desire for Improvement Financial Resources/Insurance Housing Resilience Social Support Talents/Skills Transportation Vocational/Educational  ADL's:  Intact  Cognition: Impaired,  Moderate  Sleep:  with Seroquel    Screenings: GAD-7    Flowsheet Row [facility] Counselor from 01/02/2024 in [facility]  Total GAD-7 Score 10 11   PHQ2-9    Flowsheet Row Office Visit from 01/30/2024 in [facility] Counselor from 01/02/2024 in [facility] Total Score 4 5  PHQ-9 Total Score 15 19   Flowsheet Row Counselor from 01/02/2024 in [facility] to Hosp-Admission (Discharged) from 12[facility]10/2023 in [facility] to Hosp-Admission (Discharged) from 07/28/2023 in [facility]  C-SSRS RISK CATEGORY Moderate Risk No Risk No Risk     Assessment Some improvement in cravings and anergy.Obsession remains.His anxiety is in need of CBT/DBT Wants increase in Wellbutrin  SR   and Plan: Given Copy of [facility] and Anxiety Handbook for self study/help  FU with Counselor Continue medications with increase in Wellbutrin  SR 200 mg   Collaboration of Care: Collaboration of Care: Primary Care Provider AEB   and Other provider involved in patient's care AEB    Patient/Guardian was advised Release of Information must be  obtained prior to any record release in order to collaborate their care with an outside provider. Patient/Guardian was advised if they have not already done so to contact the registration department to sign all necessary forms in order for us  to release information regarding their care.   Consent: Patient/Guardian gives verbal consent for treatment and assignment of benefits for services provided during this visit. Patient/Guardian expressed understanding and agreed to proceed.    Carlin Emmer, PA-C 03/16/2024, 5:03 PM

## 2024-03-24 ENCOUNTER — Ambulatory Visit (INDEPENDENT_AMBULATORY_CARE_PROVIDER_SITE_OTHER): Payer: MEDICAID | Admitting: Licensed Clinical Social Worker

## 2024-03-24 DIAGNOSIS — F4010 Social phobia, unspecified: Secondary | ICD-10-CM

## 2024-03-24 DIAGNOSIS — F1020 Alcohol dependence, uncomplicated: Secondary | ICD-10-CM | POA: Diagnosis not present
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
## 2024-04-13 ENCOUNTER — Ambulatory Visit (HOSPITAL_COMMUNITY): Payer: MEDICAID | Admitting: Medical

## 2024-04-21 ENCOUNTER — Ambulatory Visit (HOSPITAL_COMMUNITY): Payer: MEDICAID | Admitting: Licensed Clinical Social Worker

## 2024-04-30 ENCOUNTER — Encounter (HOSPITAL_COMMUNITY): Payer: Self-pay | Admitting: Medical

## 2024-04-30 ENCOUNTER — Other Ambulatory Visit: Payer: Self-pay

## 2024-04-30 ENCOUNTER — Ambulatory Visit (HOSPITAL_COMMUNITY): Payer: MEDICAID | Admitting: Medical

## 2024-04-30 VITALS — BP 128/85 | HR 90 | Ht 70.0 in | Wt 213.0 lb

## 2024-04-30 DIAGNOSIS — F341 Dysthymic disorder: Secondary | ICD-10-CM | POA: Diagnosis not present

## 2024-04-30 DIAGNOSIS — E66811 Obesity, class 1: Secondary | ICD-10-CM

## 2024-04-30 DIAGNOSIS — K2920 Alcoholic gastritis without bleeding: Secondary | ICD-10-CM

## 2024-04-30 DIAGNOSIS — F1021 Alcohol dependence, in remission: Secondary | ICD-10-CM | POA: Diagnosis not present

## 2024-04-30 DIAGNOSIS — F419 Anxiety disorder, unspecified: Secondary | ICD-10-CM

## 2024-04-30 DIAGNOSIS — I83893 Varicose veins of bilateral lower extremities with other complications: Secondary | ICD-10-CM

## 2024-04-30 DIAGNOSIS — Z87898 Personal history of other specified conditions: Secondary | ICD-10-CM

## 2024-04-30 DIAGNOSIS — Z789 Other specified health status: Secondary | ICD-10-CM

## 2024-04-30 DIAGNOSIS — Z0282 Encounter for adoption services: Secondary | ICD-10-CM

## 2024-04-30 DIAGNOSIS — F4010 Social phobia, unspecified: Secondary | ICD-10-CM

## 2024-04-30 DIAGNOSIS — R7989 Other specified abnormal findings of blood chemistry: Secondary | ICD-10-CM

## 2024-04-30 MED ORDER — PREGABALIN 150 MG PO CAPS: 150.0000 mg | ORAL_CAPSULE | Freq: Three times a day (TID) | ORAL | 2 refills | Status: AC

## 2024-04-30 MED ORDER — BUPROPION HCL ER (SR) 150 MG PO TB12: 150.0000 mg | ORAL_TABLET | Freq: Two times a day (BID) | ORAL | 1 refills | Status: DC

## 2024-04-30 MED ORDER — HYDROXYZINE HCL 50 MG PO TABS: 50.0000 mg | ORAL_TABLET | Freq: Two times a day (BID) | ORAL | 0 refills | Status: AC | PRN

## 2024-04-30 MED ORDER — BACLOFEN 10 MG PO TABS: 10.0000 mg | ORAL_TABLET | Freq: Three times a day (TID) | ORAL | 0 refills | Status: AC

## 2024-04-30 NOTE — Progress Notes (Signed)
 [facility] MD/PA/NP OP Progress Note  04/30/2024 5:03 PM Jermaine [name]  MRN:  [id number]  Chief Complaint:  Chief Complaint  Patient presents with   Follow-up   Alcohol  Problem   Anxiety   Family Problem   Dysthymia   Adopted   HPI: Jermaine [name] returns for scheduled 1 month FU for  for for his complex mental health issues and addicted brain (AUD Severe Dependence) beginning with fallout from his infant adoption which when revealed set him back emotionally especialy since compared to his peers his parents were poor and could not afford the kinds of clothes he saw his peers wearing. In addition when asked to stand up in front of his class and speak he panicked and to this day finds himself unable to be in groups and to speak in front of groups (including AA). He reports that he felt he had to go to [place]  over Thanksgiving to see his mother.He flew (and DID NOT DRINK despite Airports being a trigger in the past) He continues to c/o amotivational/anhedonic but senses he may be on verge of brealthru with this increase in his Buiprenorphine. He is not c/o being ADD. He is going to move from [place] to Fulltime at work. He continues to see his Therapist here..  Visit Diagnosis:    ICD-10-CM   1. Alcohol  use disorder, severe, in early remission, dependence (HCC)  F10.21     2. Social anxiety disorder  F40.10     3. 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 Anxiety disorder with panic attacks  F41.9     5. Chronic alcoholic gastritis without hemorrhage  K29.20     6. Class 1 obesity  E66.811     7. Family history not known due to adoption  Z78.9     8. Adopted infant  Z02.82     9. History of elevated PSA  Z87.898     10. Low testosterone in male  R77.89     68. Varicose veins of bilateral lower extremities with other complications  I83.893       Past Psychiatric History:   Past Medical History:  CARE EVERYWHERE reviewed [facility]  Initial  consult 04/10/2024 [facility] Reason for Referral DIAGNOSTIC TESTING (Routine) - Pending Review Reason for Referral - DIAGNOSTIC TESTING (Routine) - Pending Review Specialty Diagnoses / Procedures Referred By Contact Referred To Contact  Gastroenterology Diagnoses  Colon cancer screening     Hilliard, Rosaline Morrison, PA-C  [address]  [address]  [place], [place] [address]  Phone: [phone]  fax:[phone]     PLAN: Egd ASAP and colonoscopy at hospital d/t insurance   [facility]  IMPRESSION:  No convincing MR evidence for the presence of a clinically significant prostate cancer.  MRI PELVIS W AND WO CONTRAST, 03/17/2024 3:38 PM  INDICATION: Prostate cancer suspected, Elevated prostate specific antigen (PSA) \ R97.20 Elevated prostate specific antigen (PSA)  COMPARISON: None  TECHNIQUE: Multi-planar, multi-sequence MR images of the pelvis were obtained prior to and after intravenous administration of gadolinium-based contrast.  Per institution protocol optimizations, parameters were selected with a focus on the prostate gland, in overall compliance with current ACR PI-RADS(R) recommendations.  ADDITIONAL HISTORY: PSA 3.4 on 03/02/2024 and 4.8 on 01/01/2024   Past Medical History:  Diagnosis Date   Alcohol  abuse    Anxiety    Class 1 obesity 09/14/2021   Depression  Past Surgical History:  Procedure Laterality Date   HERNIA REPAIR      Family Psychiatric History:   Adopted   Family History:  Family History  Adopted: Yes    Social History:  cioeconomic History   Marital status: Single      Spouse name: never been married    Number of children: has no kids   Years of education: attended college for eight years having enough credits for two different degrees but did not graduate as he could not get through his foreign language requirements.    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    Sexual activity: Not on file  Other Topics Concern   Not on file  Social History Narrative   He lives in an apartment by himself which is the same place he has lived since moving back to this area His mother and sister who live in [place]  are supportive.  Has no kids as he believes that his drinking was too severe for him to have a wife and kids.     Social Drivers of Barista Strain: Not on file  Food Insecurity: Patient Declined (07/28/2023)    Hunger Vital Sign     Worried About Running Out of Food in the Last Year: Patient declined     Ran Out of Food in the Last Year: Patient declined  Transportation Needs: No Transportation Needs (12/11/2023)    PRAPARE - Therapist, Art (Medical): No     Lack of Transportation (Non-Medical): No  Physical Activity: Not on file  Stress:  Stressors:  Illness    Coping Ability:  Overwhelmed      Social Connections: See Narrative      Allergies:  Allergies  Allergen Reactions   Ketamine      Hx of causing fever in 2024    Codeine Itching    Metabolic Disorder Labs: Lab Results  Component Value Date   HGBA1C 5.4 01/16/2023   MPG 108.28 01/16/2023   MPG 108.28 03/20/2019   No results found for: PROLACTIN Lab Results  Component Value Date   TRIG 126 01/23/2023   Lab Results  Component Value Date    TSH 1.241 07/29/2023   TSH 0.544 04/22/2022    Therapeutic Level Labs:   Current Medications: Current Outpatient Medications  Medication Sig Dispense Refill   baclofen  (LIORESAL ) 10 MG tablet Take 1 tablet (10 mg total) by mouth 3 (three) times daily. 270 tablet 0   buPROPion  ER (WELLBUTRIN  SR) 150 MG 12 hr tablet Take 1 tablet (150 mg total) by mouth 2 (two) times daily. 60 tablet 1   Cholecalciferol 125 MCG (5000 UT) TABS Take 5,000 Units by mouth.  hydrOXYzine  (ATARAX ) 50 MG tablet Take 1 tablet (50 mg total) by mouth 2 (two) times daily as needed for anxiety. 180 tablet 0   pregabalin  (LYRICA ) 150 MG capsule Take 1 capsule (150 mg total) by mouth in the morning, at noon, and at bedtime. 90 capsule 2   propranolol  (INDERAL ) 20 MG tablet Take 1 tablet (20 mg total) by mouth 2 (two) times daily as needed. 180 tablet 0   thiamine  (VITAMIN B1) 100 MG tablet 1 tablet Orally Once a day; Duration: 90 days     No current facility-administered medications for this visit.     Musculoskeletal: Strength & Muscle Tone: within normal limits Gait & Station: normal Patient leans: N/A  Psychiatric Specialty Exam: Review of Systems  Constitutional:  Positive for activity change (going Full time at [employer]). Negative for appetite change, chills, diaphoresis, fatigue, fever and unexpected weight change.  Respiratory:  Negative for apnea, cough, choking, chest tightness, shortness of breath, wheezing and stridor.   Cardiovascular:  Negative for chest pain, palpitations and leg swelling.  Gastrointestinal:  Positive for abdominal pain. Negative for abdominal distention, anal bleeding, blood in stool, constipation, diarrhea, nausea, rectal pain and vomiting.       Pharyngoesophageal dysphagia  Gastroesophageal reflux disease, unspecified whether esophagitis present    Endocrine: Negative for cold intolerance, heat intolerance, polydipsia, polyphagia and polyuria.  Genitourinary:  Positive  for difficulty urinating. Negative for decreased urine volume, dysuria, enuresis, flank pain, frequency, genital sores, hematuria, penile discharge, penile pain, penile swelling, scrotal swelling, testicular pain and urgency.  Musculoskeletal:  Positive for arthralgias (tennis elbow lt). Negative for back pain, gait problem, joint swelling, myalgias, neck pain and neck stiffness.  Neurological:  Negative for dizziness, tremors, seizures, syncope, facial asymmetry, speech difficulty, weakness, light-headedness, numbness and headaches.  Psychiatric/Behavioral:  Positive for dysphoric mood. Negative for agitation, behavioral problems, confusion, decreased concentration, hallucinations, self-injury, sleep disturbance and suicidal ideas. The patient is nervous/anxious. The patient is not hyperactive.     Blood pressure 128/85, pulse 90, height 5' 10 (1.778 m), weight 213 lb (96.6 kg).Body mass index is 30.56 kg/m.  General Appearance: Casual and Neat  Eye Contact:  Good  Speech:  Clear and Coherent and Normal Rate  Volume:  Normal  Mood:  Anxious  Affect:  Appropriate and Congruent  Thought Process:  Coherent, Goal Directed, and Descriptions of Associations: Intact  Orientation:  Full (Time, Place, and Person)  Thought Content: WDL, Logical, Illogical, Obsessions, and Rumination   Suicidal Thoughts:  No  Homicidal Thoughts:  No  Memory:  Trauma informed   Judgement:  Impaired  Insight:  Lacking  Psychomotor Activity:  Negative  Concentration:  Concentration: Good and Attention Span: Good  Recall:  see memory  Fund of Knowledge: WDL  Language: Good  Akathisia:  NA  Handed:  Right  AIMS (if indicated): na  Assets:  Desire for Improvement Financial Resources/Insurance Housing Resilience Talents/Skills Transportation Vocational/Educational  ADL's:  Intact  Cognition: Impaired,  Mild and Moderate  Sleep:  RX Seroquel    Screenings: GAD-7    Flowsheet Row [facility]2024 [facility] Counselor from 01/02/2024 in [facility]  Total GAD-7 Score 10 11   PHQ2-9    Flowsheet Row Office Visit from 01/30/2024 in [facility] Counselor from 01/02/2024 in [facility] Total Score 4 5  PHQ-9 Total Score 15 19  Flowsheet Row Counselor from 01/02/2024 in [facility] at [facility] ED to Hosp-Admission (Discharged) from 12/10/2023 in [facility] to Hosp-Admission (Discharged) from 07/28/2023 in [place] [facility]  C-SSRS RISK CATEGORY Moderate Risk No Risk No Risk     Assessment Appears to making progress     and Plan: Increase Wellbutrin  SR to 150 mg BID FU 30 days Sooner if needed  Collaboration of Care: Collaboration of Care: Other provider involved in patient's care AEB    Patient/Guardian was advised Release of Information must be obtained prior to any record release in order to collaborate their care with an outside provider. Patient/Guardian was advised if they have not already done so to contact the registration department to sign all necessary forms in order for us  to release information regarding their care.   Consent: Patient/Guardian gives verbal consent for treatment and assignment of benefits for services provided during this visit. Patient/Guardian expressed understanding and agreed to proceed.    Carlin Emmer, PA-C 04/30/2024, 5:03 PM

## 2024-05-05 ENCOUNTER — Ambulatory Visit (INDEPENDENT_AMBULATORY_CARE_PROVIDER_SITE_OTHER): Payer: MEDICAID | Admitting: Licensed Clinical Social Worker

## 2024-05-05 ENCOUNTER — Telehealth (HOSPITAL_COMMUNITY): Payer: Self-pay | Admitting: *Deleted

## 2024-05-05 DIAGNOSIS — F4010 Social phobia, unspecified: Secondary | ICD-10-CM

## 2024-05-05 DIAGNOSIS — F341 Dysthymic disorder: Secondary | ICD-10-CM

## 2024-05-05 DIAGNOSIS — F1021 Alcohol dependence, in remission: Secondary | ICD-10-CM

## 2024-05-05 DIAGNOSIS — F419 Anxiety disorder, unspecified: Secondary | ICD-10-CM

## 2024-05-05 NOTE — Progress Notes (Signed)
 THERAPIST PROGRESS NOTE  Session Time: 9 a.m. to 9:45 a.m.    Virtual Visit via Video Note   I connected with Jermaine [name] at 9 a.m EST by a video enabled telemedicine application and verified that I am speaking with the correct person using two identifiers.   Location: Patient: home Provider: [facility] office   I discussed the limitations of evaluation and management by telemedicine and the availability of in person appointments. The patient expressed understanding and agreed to proceed.  Type of Therapy: Individual   Therapist Response/Interventions: CBT/The therapist observes that Jermaine [name] does not have to change jobs at this time noting that AA recommends people not make big life changes in year one of recovery. He could always look after a year of sobriety.  The therapist discloses his belief that Mike's skipping his company's Xmas party after work sounds more of a product of introversion than his social anxiety.   The therapist suggests that Jermaine [name] can change home groups and meetings when he goes back to adjust to his change in work schedule.   The therapist explains that it is myth that he will never have fun after he stops drinking pointing out that those with years of recovery report having more fun sober than drunk. The therapist talks to him about PAWS and how it gets better over time.    Treatment Goals addressed:  Active     Substance Use     Jermaine [name] will abstain completely from alcohol  per self-report and random breathalyzer as indicated in addition to random UDS. (Progressing)     Start:  01/03/24    Expected End:  07/05/24         Jermaine [name] will overcome his social anxiety as evidenced by no longer feeling on the outside of Alcoholics Anonymous.  (Not Progressing)     Start:  01/03/24    Expected End:  07/05/24         The therapist will assist Jermaine [name] in being able to identify and avoid triggers for drinking.     Start:  01/03/24         The therapist will utilize CBT to  help Jermaine [name] overcome his social anxiety involving group settings.      Start:  01/03/24                        Summary: Kepner presents says that as far as calling people that he hates asking for help. One thing that was worrying him since he was last seen by this therapist was flying to see his family without drinking while at the airport as the airports is a big trigger for him. He did manage to fly without drinking.   He says that not a whole lot has changed as he was given full-time. 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 He says that he is continuously busy. He does not like leaving work feeling like everything has not been done.   Jermaine [name] says that he has to learn to be happy with what he has now; however, he is putting resumes out looking for other jobs. He backs away when someone calls for an interview not wanting to get out of his comfort zone. He says that when he was working part-time that he was getting help from  his mother which made him feel like less of a man. Now, with full-time, he can pay his rent but it will be tight.   He says that he gets call about sales jobs but is not sure he wants to jeopardize his mental health. Jermaine [name] says that he has almost 5 months sober which is the longest in a long time. Jermaine [name] says that his mood has been okay. He saw the PA-C last month and the PA-C upped the dosage of his Wellbutrin .   Jermaine [name] has not returned to [employer] saying that he might go back when he hits 6 months. He says that he never addressed the problems with the former friend as they don't really talk; however, he says that he probably will if he sees him in the future.       Progress Towards Goals: Progressing   Suicidal/Homicidal: No SI or HI  Plan: Jermaine [name] says that he will have to get back with this therapist  on rescheduling as he has to figure out how his new insurance will work once he no longer has Medicaid. He has 30 days as he is working full-time.   Diagnosis: Alcohol  Use Disorder, Severe and Social Anxiety  Collaboration of Care: Other N/A  Patient/Guardian was advised Release of Information must be obtained prior to any record release in order to collaborate their care with an outside provider. Patient/Guardian was advised if they have not already done so to contact the registration department to sign all necessary forms in order for us  to release information regarding their care.   Consent: Patient/Guardian gives verbal consent for treatment and assignment of benefits for services provided during this visit. Patient/Guardian expressed understanding and agreed to proceed.   Zell Maier, MA, LCSW, [facility], LCAS 05/05/2024

## 2024-05-05 NOTE — Telephone Encounter (Signed)
 Pt called to advise that he has been mistakenly been taking 2 tablets BID of the increased dose of Wellbutrin  SR 150 mg. The order on 04/30/24 was for Wellbutrin  SR 150 mg 1 tab BID. Pt states he feels great with no s/e, and wants to know if he should stay at current dose? Or decrease dose to 300 mg total dose every day. Please advise.

## 2024-05-06 ENCOUNTER — Other Ambulatory Visit (HOSPITAL_COMMUNITY): Payer: Self-pay | Admitting: *Deleted

## 2024-05-06 MED ORDER — BUPROPION HCL ER (SR) 150 MG PO TB12: ORAL_TABLET | ORAL | 1 refills | Status: DC

## 2024-05-25 NOTE — Telephone Encounter (Signed)
 Patient called back about his medication - he stated that Olivia did call him back but did not tell him that she had sent a new rx to the pharmacy. Patient was worried he was going to run out too soon. I advised him that it was in fact at the pharmacy.

## 2024-06-04 ENCOUNTER — Ambulatory Visit (HOSPITAL_COMMUNITY): Admitting: Medical

## 2024-06-14 ENCOUNTER — Other Ambulatory Visit (HOSPITAL_COMMUNITY): Payer: Self-pay | Admitting: Medical

## 2024-06-15 ENCOUNTER — Telehealth (HOSPITAL_COMMUNITY): Payer: MEDICAID | Admitting: Medical

## 2024-06-16 ENCOUNTER — Ambulatory Visit (HOSPITAL_COMMUNITY): Payer: MEDICAID | Admitting: Licensed Clinical Social Worker

## 2024-06-16 ENCOUNTER — Encounter (HOSPITAL_COMMUNITY): Payer: Self-pay

## 2024-06-17 ENCOUNTER — Other Ambulatory Visit (HOSPITAL_COMMUNITY): Payer: Self-pay | Admitting: Medical

## 2024-06-18 ENCOUNTER — Other Ambulatory Visit (HOSPITAL_COMMUNITY): Payer: Self-pay | Admitting: Medical

## 2024-06-18 MED ORDER — BUPROPION HCL ER (SR) 150 MG PO TB12: ORAL_TABLET | ORAL | 1 refills | Status: DC

## 2024-06-18 NOTE — Progress Notes (Signed)
 Rx refill orders only

## 2024-06-22 ENCOUNTER — Telehealth (HOSPITAL_COMMUNITY): Payer: MEDICAID | Admitting: Medical

## 2024-06-24 ENCOUNTER — Telehealth (HOSPITAL_BASED_OUTPATIENT_CLINIC_OR_DEPARTMENT_OTHER): Payer: MEDICAID | Admitting: Medical

## 2024-06-24 ENCOUNTER — Encounter (HOSPITAL_COMMUNITY): Payer: Self-pay | Admitting: Medical

## 2024-06-24 DIAGNOSIS — Z87898 Personal history of other specified conditions: Secondary | ICD-10-CM

## 2024-06-24 DIAGNOSIS — F341 Dysthymic disorder: Secondary | ICD-10-CM | POA: Diagnosis not present

## 2024-06-24 DIAGNOSIS — K2920 Alcoholic gastritis without bleeding: Secondary | ICD-10-CM

## 2024-06-24 DIAGNOSIS — F4010 Social phobia, unspecified: Secondary | ICD-10-CM

## 2024-06-24 DIAGNOSIS — Z0282 Encounter for adoption services: Secondary | ICD-10-CM

## 2024-06-24 DIAGNOSIS — Z6827 Body mass index (BMI) 27.0-27.9, adult: Secondary | ICD-10-CM | POA: Diagnosis not present

## 2024-06-24 DIAGNOSIS — Z8489 Family history of other specified conditions: Secondary | ICD-10-CM

## 2024-06-24 DIAGNOSIS — R7989 Other specified abnormal findings of blood chemistry: Secondary | ICD-10-CM

## 2024-06-24 DIAGNOSIS — F1021 Alcohol dependence, in remission: Secondary | ICD-10-CM | POA: Diagnosis not present

## 2024-06-24 DIAGNOSIS — F419 Anxiety disorder, unspecified: Secondary | ICD-10-CM | POA: Diagnosis not present

## 2024-06-24 DIAGNOSIS — Z789 Other specified health status: Secondary | ICD-10-CM

## 2024-06-24 DIAGNOSIS — F410 Panic disorder [episodic paroxysmal anxiety] without agoraphobia: Secondary | ICD-10-CM

## 2024-06-24 DIAGNOSIS — I83893 Varicose veins of bilateral lower extremities with other complications: Secondary | ICD-10-CM | POA: Diagnosis not present

## 2024-06-24 DIAGNOSIS — E66811 Obesity, class 1: Secondary | ICD-10-CM

## 2024-06-24 DIAGNOSIS — Z87438 Personal history of other diseases of male genital organs: Secondary | ICD-10-CM

## 2024-06-24 MED ORDER — BUPROPION HCL ER (SR) 150 MG PO TB12: ORAL_TABLET | ORAL | 1 refills | Status: AC

## 2024-06-24 NOTE — Progress Notes (Signed)
 BH MD/PA/NP OP Progress Note  06/24/2024 11:41 AM Jermaine [name]  MRN:  [id number] Virtual Visit via Video Note  I connected with Jermaine [name] on 06/24/24 at 11:30 AM EST by a video enabled telemedicine application and verified that I am speaking with the correct person using two identifiers.  Location: Patient: At home Provider: [facility] [address] [place]   I discussed the limitations of evaluation and management by telemedicine and the availability of in person appointments. The patient expressed understanding and agreed to proceed.   History of Present Illness:See EPIC note    Observations/Objective:See EPIC note   Assessment and Plan:See EPIC note   Follow Up Instructions:See EPIC note   I discussed the assessment and treatment plan with the patient. The patient was provided an opportunity to ask questions and all were answered. The patient agreed with the plan and demonstrated an understanding of the instructions.   The patient was advised to call back or seek an in-person evaluation if the symptoms worsen or if the condition fails to improve as anticipated.  I provided 20 minutes of non-face-to-face time during this encounter.   Carlin Emmer, PA-C   Chief Complaint:  Chief Complaint  Patient presents with   Follow-up   Alcohol  Problem   Anxiety   Dysthymia   HPI: Jermaine [name] returns for FU for medication management and his complex mental health issues and addicted brain (AUD Severe Dependence) beginning with fallout from his infant adoption which when revealed set him back emotionally especialy since compared to his peers his parents were poor and could not afford the kinds of clothes he saw his peers wearing. In addition when asked to stand up in front of his class and speak he panicked and to this day finds himself unable to be in groups and to speak in front of groups (including AA).  He requested video visit as he has begun working Full time at [employer] in the [place] [place]  [employer].12:30-900pm.He likes his work and his hours as he has time in mornings to get ready without feeling rushed.He does say he wish3es the money were a little better  There was some confusion over his Wellbutrin  prescription.which was increasedv to maximum dose of 450 mg based on incomplete response to 300 mg to his anhedonia and anergy.He admits he was hoping for some euphoria like he recalled from his alcohol  use but he he has found Wellbutrin  providing energy and attention.He self criticized over being lazy. He last met with Counselor 05/05/2024   Summary: Jermaine [name] presents says that as far as calling people that he hates asking for help. One thing that was worrying him since he was last seen by this therapist was flying to see his family without drinking while at the airport as the airports is a big trigger for him. He did manage to fly without drinking.   He says that not a whole lot has changed as he was given full-time. 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 He says that he is continuously busy. He does not like leaving work feeling like everything has not been done.   Jermaine [name] says that he has to learn to be happy with what he has now; however, he is putting resumes out looking for other jobs. He backs away when someone calls for an interview not  wanting to get out of his comfort zone. He says that when he was working part-time that he was getting help from his mother which made him feel like less of a man. Now, with full-time, he can pay his rent but it will be tight.   He says that he gets call about sales jobs but is not sure he wants to jeopardize his mental health. Jermaine [name] says that he has almost 5 months sober which is the longest in a long time. Jermaine [name] says that his mood has been okay. He saw the PA-C last month and the PA-C  upped the dosage of his Wellbutrin .   Jermaine [name] has not returned to [employer] saying that he might go back when he hits 6 months. He says that he never addressed the problems with the former friend as they don't really talk; however, he says that he probably will if he sees him in the future.  Progress Towards Goals: Progressing   Suicidal/Homicidal: No SI or HI  Plan: Jermaine [name] says that he will have to get back with this therapist on rescheduling as he has to figure out how his new insurance will work once he no longer has Medicaid. He has 30 days as he is working full-time.  ( Has scheduled Appt 06/30/2024)   Visit Diagnosis:    ICD-10-CM   1. Alcohol  use disorder, severe, in early remission, dependence (HCC)  F10.21     2. Social anxiety disorder  F40.10     3. 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 Anxiety disorder with panic attacks  F41.9     5. Chronic alcoholic gastritis without hemorrhage  K29.20     6. Class 1 obesity  E66.811     7. Family history not known due to adoption  Z78.9     8. Adopted infant  Z02.82     9. History of elevated PSA  Z87.898     10. Low testosterone in male  R42.89     57. Varicose veins of bilateral lower extremities with other complications  I83.893       Past Psychiatric History:  [facility] for residential treatment a 3 times, he says that he was in [facility]'s intensive outpatient treatment program twice  Sister had him hospitalized approximately a year ago at [facility].   Past Medical History:  CARE EVERYWHERE REVIEWED  [facility] 05/07/2024  Plan I reviewed the results of the study with the patient today. He has some mild venous insufficiency in both lower extremities. However he does not have completely incompetent great saphenous vein in either lower extremity. As a result I would not recommend a laser vein ablation of the great saphenous vein at this point. The  varicosity in his right lower extremity likely was initiated by the trauma of his rugby injury. We could help with the discomfort that he is having from the varicosity and some of the achiness and heaviness in his right leg that could be related to the varicosity by removing the varicosity. This will be performed as a Micro-stab phlebectomy would take 20-30 incisions. I reviewed the procedure, risk, complications, and alternatives including bleeding, infection, nerve irritation, scarring and skin discoloration, and less than 1% risk of DVT. The patient would like to proceed with the intervention. We will work on therapist, occupational and then call him for scheduling. Any questions or concerns and give us  call at any time  otherwise we will be in touch once we hear back from his insurance company for scheduling.   Past Medical History:  Diagnosis Date   Alcohol  abuse    Anxiety    Class 1 obesity 09/14/2021   Depression     Past Surgical History:  Procedure Laterality Date   HERNIA REPAIR      Family Psychiatric History: Adopted at birth  Family History:  Family History  Adopted: Yes    Social History:  Social History    Marital status: Single      Spouse name: never been married    Number of children: has no kids   Years of education: attended college for eight years having enough credits for two different degrees but did not graduate as he could not get through his foreign language requirements.    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    Sexual activity: Not on file  Other Topics Concern    raised Catholic but was agnostic for a long time but has become a believer in Christianity and has been attending a Bible study for the past year and a half.     Social History Narrative   He lives in an apartment by himself which is the same place he has lived since moving back to this area His mother and sister who live in [place]  are supportive.  Has no kids as he believes that his drinking was too severe for him to have a wife and kids.     Social Drivers of Health   Tobacco Use: Low Risk (05/07/2024)   Received from [facility]   Patient History    Smoking Tobacco Use: Never    Smokeless Tobacco Use: Never    Passive Exposure: Not on file  Financial Resource Strain: Not on file  Food Insecurity: Patient Declined (07/28/2023)   Hunger Vital Sign    Worried About Running Out of Food in the Last Year: Patient declined    Ran Out of Food in the Last Year: Patient declined  Transportation Needs: No Transportation Needs (12/11/2023)   Epic    Lack of Transportation (Medical): No    Lack of Transportation (Non-Medical): No  Physical Activity: Walks 1000 steps a day at work  Stress:  Stressors Illness  Lawyer Deficits None  Supports Family    Social Connections: Not on file  Depression (PHQ2-9): High Risk (01/30/2024)   Depression (PHQ2-9)    PHQ-2 Score: 15  Alcohol  Screen: Not on file  Housing: Low Risk (12/11/2023)   Epic    Unable to Pay for Housing in the Last Year: No    Number of Times Moved in the Last Year: 0    Homeless in the Last Year: No  Utilities: Not At Risk (12/11/2023)   Epic    Threatened with loss of utilities: No  Health Literacy: Not on file    Allergies:  ies  Allergen Reactions   Ketamine         Hx of causing fever in 2024     Codeine Itching         Metabolic Disorder Labs: Lab Results  Component Value Date   HGBA1C 5.4 01/16/2023   MPG 108.28 01/16/2023   MPG 108.28 03/20/2019   No results found for: PROLACTIN Lab Results  Component Value Date   TRIG 126 01/23/2023   Lab Results  Component Value Date   TSH 1.241 07/29/2023   TSH 0.544 04/22/2022    Therapeutic Level Labs:NA  Current Medications: Current Outpatient Medications  Medication Sig Dispense Refill   baclofen  (LIORESAL ) 10 MG tablet Take 1 tablet (10 mg total) by mouth 3 (three) times daily. 270 tablet 0   buPROPion  (WELLBUTRIN  SR) 150 MG 12 hr tablet Take 2 tablets (300 mg total dose) every morning and take 1 tablet (150 mg total dose) every evening. 90 tablet 1   hydrOXYzine  (ATARAX ) 50 MG tablet Take 1 tablet (50 mg total) by mouth 2 (two) times daily as needed for anxiety. 180 tablet 0   pregabalin  (LYRICA ) 150 MG capsule Take 1 capsule (150 mg total) by mouth in the morning, at noon, and at bedtime. 90 capsule 2   No current facility-administered medications for this visit.     Musculoskeletal: Strength & Muscle Tone: Telepsych visit-Grossly normal Musculoskeletal and cranial nerve inspections Gait & Station: NA Patient leans: N/A  Psychiatric Specialty Exam: Review of Systems  Constitutional:  Positive for activity change (Job full time now). Negative for appetite change, chills, diaphoresis, fatigue, fever and unexpected weight change.  HENT:  Negative for congestion, dental problem, hearing loss, mouth sores, nosebleeds, postnasal drip, rhinorrhea, sinus pressure, sinus pain, sneezing, sore throat, tinnitus, trouble swallowing and voice change.   Eyes:  Negative for photophobia, pain, discharge, redness, itching and visual disturbance.  Respiratory:  Negative for apnea, cough, choking, chest tightness, shortness of breath, wheezing and stridor.   Cardiovascular:  Negative for chest pain, palpitations and leg swelling.   Gastrointestinal:  Negative for abdominal distention, abdominal pain, anal bleeding, blood in stool, constipation, diarrhea, nausea, rectal pain and vomiting.  Endocrine: Negative for cold intolerance, heat intolerance, polydipsia, polyphagia and polyuria.  Genitourinary:  Negative for decreased urine volume, difficulty urinating, dysuria, enuresis, flank pain, frequency, genital sores, hematuria, penile discharge, penile pain, penile swelling, scrotal swelling, testicular pain and urgency.  Musculoskeletal:  Positive for myalgias. Negative for arthralgias, back pain, gait problem, joint swelling, neck pain and neck stiffness.  Allergic/Immunologic: Negative for environmental allergies, food allergies and immunocompromised state.  Neurological:  Negative for dizziness, tremors, seizures, syncope, facial asymmetry, speech difficulty, weakness, light-headedness, numbness and headaches.  Hematological:  Negative for adenopathy. Does not bruise/bleed easily.  Psychiatric/Behavioral:  Positive for agitation and dysphoric mood (Better now on meds). Negative for behavioral problems, confusion, decreased concentration, hallucinations, self-injury, sleep disturbance and suicidal ideas. The patient is nervous/anxious (Better on meds). The patient is not hyperactive.        AUD in early remission    There were no vitals taken for this visit.There is no height or weight on file to calculate BMI.Physical Exam: 03/02/2024  BP 128/86   Pulse 60   Temp 97.8 F (36.6 C) (Temporal)   Resp 12   Ht 1.778 m (5'  10)   Wt 88.5 kg (195 lb)   BMI 27.98 kg/m   General Appearance: Casual and Neat  Eye Contact:  Good  Speech:  Clear and Coherent  Volume:  Normal  Mood:  Euthymic  Affect:  Congruent  Thought Process:  Coherent, Goal Directed, and Descriptions of Associations: Intact  Orientation:  Full (Time, Place, and Person)  Thought Content: WDL, Logical, Obsessions, and Rumination   Suicidal Thoughts:  No   Homicidal Thoughts:  No  Memory:  Has trauma  Judgement:  Impaired  Insight:  Lacking  Psychomotor Activity:  NA  Concentration:  Concentration: Good and Attention Span: Good  Recall:  Fair  Fund of Knowledge: WDL  Language: Good  Akathisia:  NA  Handed:  Right  AIMS (if indicated): NA  Assets:  Desire for Improvement Housing Resilience Social Support Talents/Skills Transportation Vocational/Educational  ADL's:  Impaired Social anxiety  Cognition: Impaired,  Mild and Moderate  Sleep:  No complaint   Screenings: GAD-7    Flowsheet Row Office Visit from [facility] Counselor from 01/02/2024 in [facility]  Total GAD-7 Score 10 11   PHQ2-9    Flowsheet Row Office Visit from 01/30/2024 in [facility] Counselor from 01/02/2024 in [facility] at [facility] Total Score 4 5  PHQ-9 Total Score 15 19   Flowsheet Row Counselor from 01/02/2024 in [facility] ED to Hosp-Admission (Discharged) from 12/10/2023 in [facility] to Hosp-Admission (Discharged) from 07/28/2023 in [facility]  C-SSRS RISK CATEGORY Moderate Risk No Risk No Risk     Assessment :Stable and improving as long as he feels he is in control    and Plan: Continue current medications and FU appt before current rx run out March 4.  Collaboration of Care: Collaboration of Care: Medication Management AEB  , Primary Care Provider AEB  , and Other provider involved in patient's care AEB    Patient/Guardian was advised Release of Information must be obtained prior to any record release in order to collaborate their care with an outside provider. Patient/Guardian was advised if they have not already done so to contact the registration department to sign all  necessary forms in order for us  to release information regarding their care.   Consent: Patient/Guardian gives verbal consent for treatment and assignment of benefits for services provided during this visit. Patient/Guardian expressed understanding and agreed to proceed.    Carlin Emmer, PA-C 06/24/2024, 11:41 AM

## 2024-06-30 ENCOUNTER — Ambulatory Visit (INDEPENDENT_AMBULATORY_CARE_PROVIDER_SITE_OTHER): Payer: MEDICAID | Admitting: Licensed Clinical Social Worker

## 2024-06-30 DIAGNOSIS — F1021 Alcohol dependence, in remission: Secondary | ICD-10-CM | POA: Diagnosis not present

## 2024-06-30 DIAGNOSIS — F419 Anxiety disorder, unspecified: Secondary | ICD-10-CM

## 2024-06-30 DIAGNOSIS — F341 Dysthymic disorder: Secondary | ICD-10-CM

## 2024-06-30 DIAGNOSIS — F4010 Social phobia, unspecified: Secondary | ICD-10-CM

## 2024-08-20 ENCOUNTER — Telehealth (HOSPITAL_COMMUNITY): Payer: MEDICAID | Admitting: Medical
